# Patient Record
Sex: Male | Born: 1950 | Race: Black or African American | Hispanic: No | Marital: Married | Smoking: Former smoker
Health system: Southern US, Community
[De-identification: ages and names within clinical notes are randomized; demographics above are authoritative.]

## PROBLEM LIST (undated history)

## (undated) DIAGNOSIS — E119 Type 2 diabetes mellitus without complications: Secondary | ICD-10-CM

## (undated) DIAGNOSIS — I482 Chronic atrial fibrillation, unspecified: Secondary | ICD-10-CM

## (undated) DIAGNOSIS — J309 Allergic rhinitis, unspecified: Secondary | ICD-10-CM

## (undated) DIAGNOSIS — K921 Melena: Secondary | ICD-10-CM

## (undated) DIAGNOSIS — I739 Peripheral vascular disease, unspecified: Secondary | ICD-10-CM

## (undated) DIAGNOSIS — Z940 Kidney transplant status: Secondary | ICD-10-CM

## (undated) DIAGNOSIS — N400 Enlarged prostate without lower urinary tract symptoms: Secondary | ICD-10-CM

## (undated) DIAGNOSIS — I10 Essential (primary) hypertension: Secondary | ICD-10-CM

## (undated) DIAGNOSIS — I82409 Acute embolism and thrombosis of unspecified deep veins of unspecified lower extremity: Secondary | ICD-10-CM

## (undated) HISTORY — PX: KIDNEY TRANSPLANT: SHX239

---
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
## 2019-02-27 ENCOUNTER — Emergency Department (HOSPITAL_BASED_OUTPATIENT_CLINIC_OR_DEPARTMENT_OTHER): Payer: Medicare HMO

## 2019-02-27 ENCOUNTER — Encounter (HOSPITAL_BASED_OUTPATIENT_CLINIC_OR_DEPARTMENT_OTHER): Payer: Self-pay

## 2019-02-27 ENCOUNTER — Inpatient Hospital Stay (HOSPITAL_BASED_OUTPATIENT_CLINIC_OR_DEPARTMENT_OTHER)
Admission: EM | Admit: 2019-02-27 | Discharge: 2019-03-08 | DRG: 871 | Disposition: A | Discharge status: Rehabilitation Facility | Payer: Medicare HMO | Attending: Internal Medicine | Admitting: Internal Medicine

## 2019-02-27 ENCOUNTER — Other Ambulatory Visit: Payer: Self-pay

## 2019-02-27 DIAGNOSIS — D696 Thrombocytopenia, unspecified: Secondary | ICD-10-CM | POA: Diagnosis present

## 2019-02-27 DIAGNOSIS — N179 Acute kidney failure, unspecified: Secondary | ICD-10-CM | POA: Diagnosis present

## 2019-02-27 DIAGNOSIS — J189 Pneumonia, unspecified organism: Secondary | ICD-10-CM | POA: Diagnosis present

## 2019-02-27 DIAGNOSIS — Y830 Surgical operation with transplant of whole organ as the cause of abnormal reaction of the patient, or of later complication, without mention of misadventure at the time of the procedure: Secondary | ICD-10-CM | POA: Diagnosis present

## 2019-02-27 DIAGNOSIS — E1151 Type 2 diabetes mellitus with diabetic peripheral angiopathy without gangrene: Secondary | ICD-10-CM | POA: Diagnosis present

## 2019-02-27 DIAGNOSIS — I482 Chronic atrial fibrillation, unspecified: Secondary | ICD-10-CM | POA: Diagnosis present

## 2019-02-27 DIAGNOSIS — J9690 Respiratory failure, unspecified, unspecified whether with hypoxia or hypercapnia: Secondary | ICD-10-CM

## 2019-02-27 DIAGNOSIS — Z20828 Contact with and (suspected) exposure to other viral communicable diseases: Secondary | ICD-10-CM | POA: Diagnosis present

## 2019-02-27 DIAGNOSIS — M1030 Gout due to renal impairment, unspecified site: Secondary | ICD-10-CM | POA: Diagnosis present

## 2019-02-27 DIAGNOSIS — D631 Anemia in chronic kidney disease: Secondary | ICD-10-CM | POA: Diagnosis present

## 2019-02-27 DIAGNOSIS — A4159 Other Gram-negative sepsis: Secondary | ICD-10-CM | POA: Diagnosis not present

## 2019-02-27 DIAGNOSIS — Z7983 Long term (current) use of bisphosphonates: Secondary | ICD-10-CM

## 2019-02-27 DIAGNOSIS — Z538 Procedure and treatment not carried out for other reasons: Secondary | ICD-10-CM | POA: Diagnosis not present

## 2019-02-27 DIAGNOSIS — Z940 Kidney transplant status: Secondary | ICD-10-CM | POA: Diagnosis not present

## 2019-02-27 DIAGNOSIS — R05 Cough: Secondary | ICD-10-CM | POA: Diagnosis not present

## 2019-02-27 DIAGNOSIS — N2889 Other specified disorders of kidney and ureter: Secondary | ICD-10-CM | POA: Diagnosis present

## 2019-02-27 DIAGNOSIS — R6520 Severe sepsis without septic shock: Secondary | ICD-10-CM | POA: Diagnosis not present

## 2019-02-27 DIAGNOSIS — J181 Lobar pneumonia, unspecified organism: Secondary | ICD-10-CM | POA: Diagnosis not present

## 2019-02-27 DIAGNOSIS — N170 Acute kidney failure with tubular necrosis: Secondary | ICD-10-CM | POA: Diagnosis not present

## 2019-02-27 DIAGNOSIS — R5381 Other malaise: Secondary | ICD-10-CM | POA: Diagnosis not present

## 2019-02-27 DIAGNOSIS — E1122 Type 2 diabetes mellitus with diabetic chronic kidney disease: Secondary | ICD-10-CM | POA: Diagnosis present

## 2019-02-27 DIAGNOSIS — R6521 Severe sepsis with septic shock: Secondary | ICD-10-CM | POA: Diagnosis present

## 2019-02-27 DIAGNOSIS — Z20822 Contact with and (suspected) exposure to covid-19: Secondary | ICD-10-CM

## 2019-02-27 DIAGNOSIS — E875 Hyperkalemia: Secondary | ICD-10-CM | POA: Diagnosis present

## 2019-02-27 DIAGNOSIS — E210 Primary hyperparathyroidism: Secondary | ICD-10-CM | POA: Diagnosis present

## 2019-02-27 DIAGNOSIS — E876 Hypokalemia: Secondary | ICD-10-CM | POA: Diagnosis present

## 2019-02-27 DIAGNOSIS — E872 Acidosis: Secondary | ICD-10-CM | POA: Diagnosis present

## 2019-02-27 DIAGNOSIS — Z79890 Hormone replacement therapy (postmenopausal): Secondary | ICD-10-CM

## 2019-02-27 DIAGNOSIS — E1169 Type 2 diabetes mellitus with other specified complication: Secondary | ICD-10-CM | POA: Diagnosis not present

## 2019-02-27 DIAGNOSIS — A419 Sepsis, unspecified organism: Secondary | ICD-10-CM | POA: Diagnosis present

## 2019-02-27 DIAGNOSIS — Z8249 Family history of ischemic heart disease and other diseases of the circulatory system: Secondary | ICD-10-CM

## 2019-02-27 DIAGNOSIS — H1132 Conjunctival hemorrhage, left eye: Secondary | ICD-10-CM | POA: Diagnosis not present

## 2019-02-27 DIAGNOSIS — E785 Hyperlipidemia, unspecified: Secondary | ICD-10-CM | POA: Diagnosis present

## 2019-02-27 DIAGNOSIS — I248 Other forms of acute ischemic heart disease: Secondary | ICD-10-CM | POA: Diagnosis present

## 2019-02-27 DIAGNOSIS — J9601 Acute respiratory failure with hypoxia: Secondary | ICD-10-CM | POA: Diagnosis present

## 2019-02-27 DIAGNOSIS — Z86718 Personal history of other venous thrombosis and embolism: Secondary | ICD-10-CM

## 2019-02-27 DIAGNOSIS — R197 Diarrhea, unspecified: Secondary | ICD-10-CM | POA: Diagnosis present

## 2019-02-27 DIAGNOSIS — I361 Nonrheumatic tricuspid (valve) insufficiency: Secondary | ICD-10-CM | POA: Diagnosis not present

## 2019-02-27 DIAGNOSIS — N183 Chronic kidney disease, stage 3 (moderate): Secondary | ICD-10-CM | POA: Diagnosis present

## 2019-02-27 DIAGNOSIS — R52 Pain, unspecified: Secondary | ICD-10-CM

## 2019-02-27 DIAGNOSIS — G9349 Other encephalopathy: Secondary | ICD-10-CM | POA: Diagnosis present

## 2019-02-27 DIAGNOSIS — J309 Allergic rhinitis, unspecified: Secondary | ICD-10-CM | POA: Diagnosis present

## 2019-02-27 DIAGNOSIS — R0902 Hypoxemia: Secondary | ICD-10-CM

## 2019-02-27 DIAGNOSIS — Z01818 Encounter for other preprocedural examination: Secondary | ICD-10-CM

## 2019-02-27 DIAGNOSIS — R600 Localized edema: Secondary | ICD-10-CM | POA: Diagnosis not present

## 2019-02-27 DIAGNOSIS — E669 Obesity, unspecified: Secondary | ICD-10-CM | POA: Diagnosis not present

## 2019-02-27 DIAGNOSIS — M1000 Idiopathic gout, unspecified site: Secondary | ICD-10-CM | POA: Diagnosis not present

## 2019-02-27 DIAGNOSIS — R791 Abnormal coagulation profile: Secondary | ICD-10-CM | POA: Diagnosis present

## 2019-02-27 DIAGNOSIS — Z7952 Long term (current) use of systemic steroids: Secondary | ICD-10-CM

## 2019-02-27 DIAGNOSIS — Z9911 Dependence on respirator [ventilator] status: Secondary | ICD-10-CM

## 2019-02-27 DIAGNOSIS — Z79899 Other long term (current) drug therapy: Secondary | ICD-10-CM

## 2019-02-27 DIAGNOSIS — Z841 Family history of disorders of kidney and ureter: Secondary | ICD-10-CM

## 2019-02-27 DIAGNOSIS — Z833 Family history of diabetes mellitus: Secondary | ICD-10-CM

## 2019-02-27 DIAGNOSIS — I129 Hypertensive chronic kidney disease with stage 1 through stage 4 chronic kidney disease, or unspecified chronic kidney disease: Secondary | ICD-10-CM | POA: Diagnosis present

## 2019-02-27 DIAGNOSIS — Z7901 Long term (current) use of anticoagulants: Secondary | ICD-10-CM

## 2019-02-27 DIAGNOSIS — T8619 Other complication of kidney transplant: Secondary | ICD-10-CM | POA: Diagnosis present

## 2019-02-27 DIAGNOSIS — I10 Essential (primary) hypertension: Secondary | ICD-10-CM | POA: Diagnosis not present

## 2019-02-27 DIAGNOSIS — E1165 Type 2 diabetes mellitus with hyperglycemia: Secondary | ICD-10-CM | POA: Diagnosis not present

## 2019-02-27 DIAGNOSIS — R972 Elevated prostate specific antigen [PSA]: Secondary | ICD-10-CM | POA: Diagnosis present

## 2019-02-27 DIAGNOSIS — M10062 Idiopathic gout, left knee: Secondary | ICD-10-CM | POA: Diagnosis not present

## 2019-02-27 DIAGNOSIS — Z87891 Personal history of nicotine dependence: Secondary | ICD-10-CM

## 2019-02-27 DIAGNOSIS — E8770 Fluid overload, unspecified: Secondary | ICD-10-CM | POA: Diagnosis not present

## 2019-02-27 HISTORY — DX: Acute embolism and thrombosis of unspecified deep veins of unspecified lower extremity: I82.409

## 2019-02-27 HISTORY — DX: Benign prostatic hyperplasia without lower urinary tract symptoms: N40.0

## 2019-02-27 HISTORY — DX: Allergic rhinitis, unspecified: J30.9

## 2019-02-27 HISTORY — DX: Type 2 diabetes mellitus without complications: E11.9

## 2019-02-27 HISTORY — DX: Chronic atrial fibrillation, unspecified: I48.20

## 2019-02-27 HISTORY — DX: Melena: K92.1

## 2019-02-27 HISTORY — DX: Essential (primary) hypertension: I10

## 2019-02-27 HISTORY — DX: Kidney transplant status: Z94.0

## 2019-02-27 HISTORY — DX: Peripheral vascular disease, unspecified: I73.9

## 2019-02-27 LAB — COMPREHENSIVE METABOLIC PANEL
ALT: 15 U/L (ref 0–44)
AST: 20 U/L (ref 15–41)
Albumin: 3.3 g/dL — ABNORMAL LOW (ref 3.5–5.0)
Alkaline Phosphatase: 41 U/L (ref 38–126)
Anion gap: 11 (ref 5–15)
BUN: 32 mg/dL — ABNORMAL HIGH (ref 8–23)
CO2: 18 mmol/L — ABNORMAL LOW (ref 22–32)
Calcium: 9.4 mg/dL (ref 8.9–10.3)
Chloride: 109 mmol/L (ref 98–111)
Creatinine, Ser: 2.01 mg/dL — ABNORMAL HIGH (ref 0.61–1.24)
GFR calc Af Amer: 38 mL/min — ABNORMAL LOW (ref >60)
GFR calc non Af Amer: 33 mL/min — ABNORMAL LOW (ref >60)
Glucose, Bld: 147 mg/dL — ABNORMAL HIGH (ref 70–99)
Potassium: 4.2 mmol/L (ref 3.5–5.1)
Sodium: 138 mmol/L (ref 135–145)
Total Bilirubin: 2 mg/dL — ABNORMAL HIGH (ref 0.3–1.2)
Total Protein: 6 g/dL — ABNORMAL LOW (ref 6.5–8.1)

## 2019-02-27 LAB — POCT I-STAT 7, (LYTES, BLD GAS, ICA,H+H)
Acid-base deficit: 10 mmol/L — ABNORMAL HIGH (ref 0.0–2.0)
Bicarbonate: 12 mmol/L — ABNORMAL LOW (ref 20.0–28.0)
Calcium, Ion: 1.32 mmol/L (ref 1.15–1.40)
HCT: 39 % (ref 39.0–52.0)
Hemoglobin: 13.3 g/dL (ref 13.0–17.0)
O2 Saturation: 94 %
Patient temperature: 102.4
Potassium: 3.5 mmol/L (ref 3.5–5.1)
Sodium: 136 mmol/L (ref 135–145)
TCO2: 12 mmol/L — ABNORMAL LOW (ref 22–32)
pCO2 arterial: 19.4 mmHg — CL (ref 32.0–48.0)
pH, Arterial: 7.406 (ref 7.350–7.450)
pO2, Arterial: 74 mmHg — ABNORMAL LOW (ref 83.0–108.0)

## 2019-02-27 LAB — CBC
HCT: 45.9 % (ref 39.0–52.0)
Hemoglobin: 13.9 g/dL (ref 13.0–17.0)
MCH: 26.9 pg (ref 26.0–34.0)
MCHC: 30.3 g/dL (ref 30.0–36.0)
MCV: 88.8 fL (ref 80.0–100.0)
Platelets: 119 10*3/uL — ABNORMAL LOW (ref 150–400)
RBC: 5.17 MIL/uL (ref 4.22–5.81)
RDW: 15.9 % — ABNORMAL HIGH (ref 11.5–15.5)
WBC: 3.8 10*3/uL — ABNORMAL LOW (ref 4.0–10.5)
nRBC: 0 % (ref 0.0–0.2)

## 2019-02-27 LAB — LIPASE, BLOOD: Lipase: 32 U/L (ref 11–51)

## 2019-02-27 LAB — URINALYSIS, ROUTINE W REFLEX MICROSCOPIC
Bilirubin Urine: NEGATIVE
Glucose, UA: NEGATIVE mg/dL
Hgb urine dipstick: NEGATIVE
Ketones, ur: 15 mg/dL — AB
Leukocytes,Ua: NEGATIVE
Nitrite: NEGATIVE
Protein, ur: NEGATIVE mg/dL
Specific Gravity, Urine: 1.025 (ref 1.005–1.030)
pH: 5.5 (ref 5.0–8.0)

## 2019-02-27 LAB — TROPONIN I
Troponin I: 0.19 ng/mL (ref <0.03)
Troponin I: 0.25 ng/mL (ref <0.03)

## 2019-02-27 LAB — SARS CORONAVIRUS 2 AG (30 MIN TAT): SARS Coronavirus 2 Ag: NEGATIVE

## 2019-02-27 LAB — BRAIN NATRIURETIC PEPTIDE: B Natriuretic Peptide: 597.6 pg/mL — ABNORMAL HIGH (ref 0.0–100.0)

## 2019-02-27 LAB — LACTIC ACID, PLASMA
Lactic Acid, Venous: 3.9 mmol/L (ref 0.5–1.9)
Lactic Acid, Venous: 4 mmol/L (ref 0.5–1.9)

## 2019-02-27 MED ORDER — SODIUM CHLORIDE 0.9 % IV BOLUS (SEPSIS)
1000.0000 mL | Freq: Once | INTRAVENOUS | Status: AC
  Administered 2019-02-27: 1000 mL via INTRAVENOUS

## 2019-02-27 MED ORDER — VANCOMYCIN HCL IN DEXTROSE 1-5 GM/200ML-% IV SOLN
1000.0000 mg | INTRAVENOUS | Status: DC
  Filled 2019-02-27: qty 200

## 2019-02-27 MED ORDER — VANCOMYCIN HCL 500 MG IV SOLR
INTRAVENOUS | Status: AC
  Filled 2019-02-27: qty 500

## 2019-02-27 MED ORDER — VANCOMYCIN HCL 10 G IV SOLR
1500.0000 mg | Freq: Once | INTRAVENOUS | Status: AC
  Administered 2019-02-27: 1500 mg via INTRAVENOUS
  Filled 2019-02-27: qty 1500

## 2019-02-27 MED ORDER — SODIUM CHLORIDE 0.9 % IV BOLUS (SEPSIS)
1000.0000 mL | Freq: Once | INTRAVENOUS | Status: AC
  Administered 2019-02-27: 20:00:00 1000 mL via INTRAVENOUS

## 2019-02-27 MED ORDER — METRONIDAZOLE IN NACL 5-0.79 MG/ML-% IV SOLN
500.0000 mg | Freq: Once | INTRAVENOUS | Status: AC
  Administered 2019-02-27: 17:00:00 500 mg via INTRAVENOUS
  Filled 2019-02-27: qty 100

## 2019-02-27 MED ORDER — SODIUM CHLORIDE 0.9 % IV SOLN: INTRAVENOUS | Status: DC

## 2019-02-27 MED ORDER — VANCOMYCIN HCL 1000 MG IV SOLR
INTRAVENOUS | Status: AC
  Filled 2019-02-27: qty 1000

## 2019-02-27 MED ORDER — SODIUM CHLORIDE 0.9 % IV SOLN
2.0000 g | Freq: Once | INTRAVENOUS | Status: AC
  Administered 2019-02-27: 2 g via INTRAVENOUS
  Filled 2019-02-27: qty 2

## 2019-02-27 MED ORDER — ACETAMINOPHEN 500 MG PO TABS
1000.0000 mg | ORAL_TABLET | Freq: Once | ORAL | Status: AC
  Administered 2019-02-27: 16:00:00 1000 mg via ORAL

## 2019-02-27 MED ORDER — SODIUM CHLORIDE 0.9 % IV SOLN
2.0000 g | Freq: Two times a day (BID) | INTRAVENOUS | Status: DC
  Administered 2019-02-28 – 2019-03-01 (×3): 2 g via INTRAVENOUS
  Filled 2019-02-27 (×5): qty 2

## 2019-02-27 MED ORDER — CEFEPIME HCL 2 G IJ SOLR
INTRAMUSCULAR | Status: AC
  Filled 2019-02-27: qty 2

## 2019-02-27 MED ORDER — SODIUM CHLORIDE 0.9 % IV BOLUS (SEPSIS)
1000.0000 mL | Freq: Once | INTRAVENOUS | Status: AC
  Administered 2019-02-27: 18:00:00 1000 mL via INTRAVENOUS

## 2019-02-27 MED ORDER — VANCOMYCIN HCL IN DEXTROSE 1-5 GM/200ML-% IV SOLN
1000.0000 mg | Freq: Once | INTRAVENOUS | Status: DC
  Filled 2019-02-27: qty 200

## 2019-02-27 MED ORDER — SODIUM CHLORIDE 0.9 % IV BOLUS
500.0000 mL | Freq: Once | INTRAVENOUS | Status: AC
  Administered 2019-02-27: 500 mL via INTRAVENOUS

## 2019-02-27 MED ORDER — ACETAMINOPHEN 500 MG PO TABS
ORAL_TABLET | ORAL | Status: AC
  Filled 2019-02-27: qty 2

## 2019-02-27 MED ORDER — IOHEXOL 300 MG/ML  SOLN
100.0000 mL | Freq: Once | INTRAMUSCULAR | Status: AC | PRN
  Administered 2019-02-27: 19:00:00 100 mL via INTRAVENOUS

## 2019-02-27 NOTE — H&P (Addendum)
NAME:  Gurtej Noyola, MRN:  [id number], DOB:  Mar 12, 1951, LOS: 0 ADMISSION DATE:  02/27/2019, CONSULTATION DATE:  02/27/19 REFERRING MD:  [facility]  CHIEF COMPLAINT:  Diarrhea with dyspnea  Brief History   Darl Kuss is a 68 y.o. male with hx renal transplant, presented to [facility] with diarrhea.  Found to be hypoxic due to multifocal PNA.  Transferred to [facility] ICU due to increasing O2 needs. Had transient hypotension. SJZ: Transfer from [facility] regional for diarrhea and dyspnea  History of present illness   Sukhraj Esquivias is a 68 y.o. male who has a PMH including but not limited to ESRD s/p renal transplant, DM, HTN, HLD, DVT on coumadin.  He presented to [facility] 6/3 with abdominal pain.  Had diarrhea that started the night prior after he had cut his grass.  Did report mild SOB and mild non productive cough.  No fevers/chills/sweats.  In ED, he was found to be hypoxic with slightly increased WOB.  He was placed on NRB to maintain SpO2 in 90s.  CXR demonstrated LLL opacity.  CT A/P demonstrated extensive PNA bilaterally L > R.  Initial rapid COVID negative.  Due to increased O2 needs and concern for deterioration, he was transferred to [facility] for further evaluation and management.  SJZ: Post Deceased Donor transplant 2013-01-03 at [facility]. Taking his immunosuppressants. No leafy vegetable intake or change in diet. No contacts with diarrhea, no blood in stool.  Dry, non-productive cough. No hemoptysis  Past Medical History  ESRD s/p renal transplant, DM, HTN, HLD, DVT on coumadin. [facility]   6/3 > admit.  Consults:  Nephrology.  Procedures:  None.  Significant Diagnostic Tests:  CXR 6/3 > LLL opacity. CT A / P 6/3 > extensive PNA bilaterally L > R.  Small prostatic neoplasm is not excluded.  Possible neoplasm in right kidney, non emergent MRI recommended.  Colonic diverticulosis.  Cholelithiasis.  Small ventral hernia. US renal transplant 6/4 >  Micro Data:  Blood  6/3 >  Sputum 6/3 >  U. Strep 6/4 >  U. Legionella 6/4 >  SARS CoV2 6/3 > neg. Full PCR resent GI stool panel 6/3 >   Antimicrobials:  Vanc 6/3 >  Cefepime 6/3 >  Flagyl (chronic given hx immunosuppression) 6/3 >    Interim history/subjective:  Comfortable on NRB.  Vitals stable. Dyspnea improved.  No fever, chills or sweats. Had fever at outside hospital.    Objective:  Blood pressure (!) 113/59, pulse (!) 114, temperature (!) 102.4 F (39.1 C), temperature source Rectal, resp. rate (!) 31, height 5\' 11"  (1.803 m), weight 83.9 kg, SpO2 92 %.        Intake/Output Summary (Last 24 hours) at 02/27/2019 2208-01-04 Last data filed at 02/27/2019 01-03-2105 Gross per 24 hour  Intake 4604.42 ml  Output 450 ml  Net 4154.42 ml   Filed Weights   02/27/19 1531  Weight: 83.9 kg    Examination: General: Adult male, resting in bed, in NAD. Neuro: A&O x 3, no deficits. HEENT: [place]/AT. Sclerae anicteric. EOMI. Cardiovascular: RRR, no M/R/G.  Lungs: Respirations even and unlabored.  CTA bilaterally, No W/R/R. Abdomen: BS x 4, soft, NT/ND.  Musculoskeletal: No gross deformities, no edema.  Skin: Intact, warm, no rashes.   Assessment & Plan:   Multifocal PNA with acute hypoxic respiratory failure. - Empiric abx with vanc / cefepime given hx immunosuppression. - Follow cultures. - Continue supplemental O2 as needed to maintain SpO2 > 92%. - Trend  lactate. - Bronchial hygiene. - Follow CXR.  Troponin bump - presumed demand. - Trend troponins.  Hx ESRD s/p renal transplant - followed [facility]. - Continue preadmission tacrolimus, mycophenolate, empiric bactrim. - Check tacrolimus and mycophenolate levels. - Stress steroids in lieu of preadmission prednisone. - F/u at [facility] (last seen 02/25/19, next appointment 1 year unless needed sooner).  Hx DVT - on coumadin. - Heparin in lieu of preadmission coumadin.  Hx HTN, HLD. - Continue preadmission atorvastatin. - Hold preadmission labetalol,  nifedipine.  Hx DM. - SSI. - Hold preadmission metformin, pioglitazone, sitagliptin, nateglinide.  Diarrhea. - Check GI stool panel.  Possible right native renal neoplasm noted on CT. - Outpatient MRI recommended.  Small prostatic abnormality noted on CT - neoplasm not excluded. - Assess PSA. - Day team to please consult urology.  Best Practice:  Diet: NPO. Pain/Anxiety/Delirium protocol (if indicated): N/A. VAP protocol (if indicated): N/A. DVT prophylaxis: SCD's / Heparin gtt. GI prophylaxis: N/A. Glucose control: SSI. Mobility: Bedrest. Code Status: Full. Family Communication: None available. Disposition: ICU.  Labs   CBC: Recent Labs  Lab 02/27/19 1534 02/27/19 1632  WBC 3.8*  --   HGB 13.9 13.3  HCT 45.9 39.0  MCV 88.8  --   PLT 119*  --    Basic Metabolic Panel: Recent Labs  Lab 02/27/19 1534 02/27/19 1632  NA 138 136  K 4.2 3.5  CL 109  --   CO2 18*  --   GLUCOSE 147*  --   BUN 32*  --   CREATININE 2.01*  --   CALCIUM 9.4  --    GFR: Estimated Creatinine Clearance: 37.5 mL/min (A) (by C-G formula based on SCr of 2.01 mg/dL (H)). Recent Labs  Lab 02/27/19 1534 02/27/19 1559 02/27/19 1830  WBC 3.8*  --   --   LATICACIDVEN  --  3.9* 4.0*   Liver Function Tests: Recent Labs  Lab 02/27/19 1534  AST 20  ALT 15  ALKPHOS 41  BILITOT 2.0*  PROT 6.0*  ALBUMIN 3.3*   Recent Labs  Lab 02/27/19 1534  LIPASE 32   No results for input(s): AMMONIA in the last 168 hours. ABG    Component Value Date/Time   PHART 7.406 02/27/2019 1632   PCO2ART 19.4 (LL) 02/27/2019 1632   PO2ART 74.0 (L) 02/27/2019 1632   HCO3 12.0 (L) 02/27/2019 1632   TCO2 12 (L) 02/27/2019 1632   ACIDBASEDEF 10.0 (H) 02/27/2019 1632   O2SAT 94.0 02/27/2019 1632    Coagulation Profile: No results for input(s): INR, PROTIME in the last 168 hours. Cardiac Enzymes: Recent Labs  Lab 02/27/19 1534 02/27/19 1841  TROPONINI 0.19* 0.25*   HbA1C: No results found for:  HGBA1C CBG: No results for input(s): GLUCAP in the last 168 hours.  Review of Systems:   All negative; except for those that are bolded, which indicate positives.  Constitutional: weight loss, weight gain, night sweats, fevers, chills, fatigue, weakness.  HEENT: headaches, sore throat, sneezing, nasal congestion, post nasal drip, difficulty swallowing, tooth/dental problems, visual complaints, visual changes, ear aches. Neuro: difficulty with speech, weakness, numbness, ataxia. CV:  chest pain, orthopnea, PND, swelling in lower extremities, dizziness, palpitations, syncope.  Resp: cough, hemoptysis, dyspnea, wheezing. GI: heartburn, indigestion, abdominal pain, nausea, vomiting, diarrhea, constipation, change in bowel habits, loss of appetite, hematemesis, melena, hematochezia.  GU: dysuria, change in color of urine, urgency or frequency, flank pain, hematuria. MSK: joint pain or swelling, decreased range of motion. Psych: change in mood or  affect, depression, anxiety, suicidal ideations, homicidal ideations. Skin: rash, itching, bruising.   Past medical history  He,  has a past medical history of Kidney transplant recipient.  History of cataract extraction right eye Hassell Done) [place] 2011  . Ocular hypertension of right eye  . Glaucoma suspect  . Cataract extraction status left eye +topical +vb +epi +20.0 MA60 (11/21/2012)  . Posterior vitreous detachment of left eye  . Deceased-donor kidney transplant recipient  . Hypertension associated with transplantation  . Hypertension, renal disease  . Anemia of chronic kidney failure  . CKD (chronic kidney disease) stage 5, GFR less than 15 ml/min (HCC)  . Bilateral leg edema  . Tremor due to multiple drugs  . Diabetes mellitus with end stage renal disease ([place])  . Diabetes mellitus type 2 in nonobese (HCC)  . Diabetes mellitus type 2 with hyperglycemia  . Gout due to renal impairment  . BPH with elevated PSA  . Immunosuppression ([place])  .  S/P kidney transplant  . Hypertension associated with diabetes ([place])  . Metabolic acidosis  . Hypomagnesemia  . Immunosuppressive management encounter following kidney transplant  . Chronic rejection of kidney transplant  . Elevated serum creatinine  . CKD (chronic kidney disease) stage 3, GFR 30-59 ml/min (HCC)  . Recurrent versus de novo disease in kidney transplant  . Epiretinal membrane, right eye  . Peripheral retinal scars right eye  . History of retinal detachment, right eye  . Retinal detachment of left eye with retinal break s/p Pneumatic + Cryo 1.21.15 vj  . Diarrhea  . Diabetes mellitus type 2 with stage 3 chronic kidney disease  . Kidney transplant status, cadaveric  . Immunosuppression therapy  . Hypertension, primary  . Hypercholesterolemia  . Acute venous embolism and thrombosis of deep vessels of proximal lower extremity ([place]) [I82.4Y9]  . Long term (current) use of anticoagulants [Z79.01]  . 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 Acute deep vein thrombosis (DVT) of distal vein of lower extremity (HCC)  . Acute deep vein thrombosis (DVT) of left lower extremity ([place])  . Acute maxillary sinusitis  . Allergic rhinitis  . Cavovarus deformity, congental  . Chronic atrial fibrillation ([place])  . Diabetes mellitus ([place])  . Essential hypertension  . Fracture of fifth metatarsal bone of right foot with delayed healing  . H/O kidney transplant  . Hematochezia  . History of kidney transplant  . Hospital discharge follow-up  . Hyperparathyroidism, primary ([place])  . Hypertension  . Immunosuppressed due to chemotherapy  . Left lower lobe pneumonia  . Leg pain, bilateral  . Paroxysmal atrial fibrillation ([place])  . PVD (peripheral vascular disease) ([place])  . Renal failure  . Right foot pain  . Type II diabetes mellitus ([place])  . Encounter for monitoring tacrolimus therapy  . Encounter for therapeutic drug monitoring  . Abnormal INR   Past Medical History:   Diagnosis Date  . AR (allergic rhinitis)  . Benign prostate hyperplasia 07/25/2012  . BPH (benign prostatic hyperplasia)   Family History  Problem Relation Age of Onset  . Heart disease Father  . Diabetes Father  . Kidney disease Mother  . Stroke Mother  . Hypertension Mother  . Cancer Paternal Uncle  . Cancer Maternal Grandfather  . Diabetes Sister  . Diabetes Brother  . Heart disease Brother  . Cataracts Brother  . Diabetes Brother  . Hypertension Brother  . Diabetes Brother  . Hypertension Brother  . Glaucoma Neg Hx  . Macular degeneration Neg Hx  .  Retinal detachment Neg Hx  . Strabismus Neg Hx  . Blindness Neg Hx  . Amblyopia Neg Hx     Surgical History    AV FISTULA PLACEMENT left upper arm (~2005 [place])  . CATARACT EXTRACTION 11/21/2012  left eye Dr Yisroel Ramming  . CATARACT EXTRACTION W/ INTRAOCULAR LENS & ANTERIOR VITRECTOMY right eye (11/26/2009 Dr. Sanda Klein)  . CATARACT EXTRACTION W/ INTRAOCULAR LENS IMPLANT 11/21/2012  Procedure: PHACOEMULSIFICATION PC / IOL TOPICAL; Surgeon: Sonia Side Burden, MD; Location: [facility]; Service: Ophthalmology; Laterality: Left;  . CYSTOSCOPY 10/13/2011  Procedure: CYSTOSCOPY; Surgeon: Bethann Goo, MD; Location: [facility] MAIN OR; Service: Urology; Laterality: N/A;  . CYSTOSCOPY W/ RETROGRADES 11/11/2010  bilateral retrogrades, biopsy  . KIDNEY TRANSPLANT N/A 12/01/2012  Procedure: KIDNEY TRANSPLANT CADAVERIC; Surgeon: Delman Kitten, MD; Location: [place]; Service: Transplant; Laterality: N/A; unos# [id number] bench at 10a and patient at 10:30a  . PARS PLANA VITRECTOMY W/ ENDOPHOTOCOAGULATION with gas bubble, right (07/16/2009 Dr. Crista Elliot)  . RETROGRADE PYELOGRAM 10/13/2011  Procedure: RETROGRADE PYELOGRAM; Surgeon: Bethann Goo, MD; Location: [place]; Service: Urology; Laterality: N/A;  . SKIN LESION EXCISION 10/13/2011  Procedure: ULTRASOUND GUIDED PROSTATE BIOPSY (TRANSRECTAL); Surgeon: Bethann Goo, MD; Location[facility] MAIN OR; Service: Urology; Laterality: N/A;    Social History    Tobacco Use  . Smoking status: Former Smoker  Packs/day: 0.50  Years: 15.00  Pack years: 7.50  Types: Cigarettes  Last attempt to quit: 07/27/1985  Years since quitting: 33.6  . Smokeless tobacco: Never Used  Substance and Sexual Activity  . Alcohol use: Yes  Comment: rarely  . Drug use: No  . Social History Narrative  Lives with wife  Smoking: Former smoker; quit in 1986.  Family history      Heart disease Father  . Diabetes Father  . Kidney disease Mother  . Stroke Mother  . Hypertension Mother  . Cancer Paternal Uncle  . Cancer Maternal Grandfather  . Diabetes Sister  . Diabetes Brother  . Heart disease Brother  . Cataracts Brother  . Diabetes Brother  . Hypertension Brother  . Diabetes Brother  . Hypertension Brother   Allergies Not on File   Home meds  Prior to Admission medications   Medication Sig Start Date End Date Taking? Authorizing Provider  atorvastatin (LIPITOR) 10 MG tablet Take 10 mg by mouth daily.   Yes [provider]  cinacalcet (SENSIPAR) 60 MG tablet Take 60 mg by mouth daily.   Yes [provider]  diclofenac sodium (VOLTAREN) 1 % GEL Apply topically 4 (four) times daily.   Yes [provider]  famotidine (PEPCID) 20 MG tablet Take 20 mg by mouth 2 (two) times daily.   Yes [provider]  finasteride (PROSCAR) 5 MG tablet Take 5 mg by mouth daily.   Yes [provider]  labetalol (NORMODYNE) 100 MG tablet Take 100 mg by mouth 2 (two) times daily.   Yes [provider]  magnesium oxide (MAG-OX) 400 MG tablet Take 400 mg by mouth daily.   Yes [provider]  metFORMIN (GLUCOPHAGE) 500 MG tablet Take by mouth 2 (two) times daily with a meal.   Yes [provider]  mycophenolate (MYFORTIC) 180 MG EC tablet Take 180 mg by mouth 2 (two) times daily.   Yes [provider]   nateglinide (STARLIX) 120 MG tablet Take 120 mg by mouth 3 (three) times daily with meals.   Yes [provider]  NIFEdipine (PROCARDIA XL/NIFEDICAL XL)  60 MG 24 hr tablet Take 60 mg by mouth daily.   Yes [provider]  pioglitazone (ACTOS) 30 MG tablet Take 30 mg by mouth daily.   Yes [provider]  predniSONE (DELTASONE) 10 MG tablet Take 10 mg by mouth daily with breakfast.   Yes [provider]  sitaGLIPtin (JANUVIA) 100 MG tablet Take 100 mg by mouth daily.   Yes [provider]  sulfamethoxazole-trimethoprim (BACTRIM) 400-80 MG tablet Take 1 tablet by mouth 2 (two) times daily.   Yes [provider]  tacrolimus (PROGRAF) 1 MG capsule Take 1 mg by mouth 2 (two) times daily.   Yes [provider]  tamsulosin (FLOMAX) 0.4 MG CAPS capsule Take 0.4 mg by mouth.   Yes [provider]  warfarin (COUMADIN) 5 MG tablet Take 5 mg by mouth daily.   Yes [provider]    Montey Hora, [place] Pager: [phone].  If no answer, [phone] 02/28/2019, 12:44 AM   I personally saw and evaluated the patient in the medical ICU.  His chest x-ray shows consolidation with minimal dyspnea and cough.  Given his diarrhea and presentation another concern would be for Legionella.  Have sent urine studies and have empirically placed him on antibiotics to cover this as well. His blood pressure has improved but he is at high risk for decompensation.  His skin is warm non-mottled and he is not in a shocklike state.  Part of the lactic acid elevation may be related to metformin. Will recheck COVID status, follow-up on labs and follow respiratory status.  We will try to obtain sputum studies after COVID status is known. Patient is mentating well and there are no neurological deficits He endorses that he has been taking his medications.  This patient  is critically ill with complex  pneumonia with immune-suppression and requires high complexity decision making for assessment and support, frequent evaluation and titration of therapies, application of advanced monitoring technologies and extensive interpretation of multiple databases.     Critical Care Time devoted to patient care services described in this note is 60 minutes. This time reflects time of care by Dr. Irene Pap, DO FCCP as well. Includes extensive review of EHR and examination.   This critical care time does not reflect procedure time, or teaching time or supervisory time of PA/NP/Med student/Med Resident etc but could involve care discussion time        Irene Pap, DO [place], [facility] [place]

## 2019-02-27 NOTE — ED Notes (Signed)
[facility] arrived to transport pt to [facility] 

## 2019-02-27 NOTE — ED Triage Notes (Signed)
Pt was cutting the grass yesterday and had episodes of diarrhea last PM. Stomach pain began today. Pt kidney transplant pt. Reports increased weakness.

## 2019-02-27 NOTE — ED Notes (Signed)
ED Provider at bedside. 

## 2019-02-27 NOTE — ED Provider Notes (Signed)
[facility] EMERGENCY DEPARTMENT Provider Note   CSN: [id number] Arrival date & time: 02/27/19  1518    History   Chief Complaint Chief Complaint  Patient presents with  . Abdominal Pain    HPI Ryan Rice is a 68 y.o. male.     Patient presented by with family members and brought him in.  Patient was cutting grass yesterday had episodes of diarrhea last evening.  Developed some upper quadrant abdominal pain today.  Patient is a kidney transplant patient last this was done in 2014.  Patient is followed by cardiologist tomorrow transplant service at [facility] was just seen there on June 1.  Patient also reported increased weakness out in triage.  When I went in to see the patient patient clearly had a significant fever 102.  Was tachycardic and had an elevated respiratory rate and was hypoxic with oxygen saturations started her Lexapro but is in the 80s on room air.  Patient also told me that he was having shortness of breath and had developed a cough today.  Did state that prior to last evening he felt fine.  Patient has no known COVID exposures.  And he said he has been careful.  Patient denied any blood in the diarrhea.  Denied any chest pain.  But did describe a left upper quadrant abdominal pain.     Past Medical History:  Diagnosis Date  . Kidney transplant recipient     There are no active problems to display for this patient.        Home Medications    Prior to Admission medications   Medication Sig Start Date End Date Taking? Authorizing Provider  atorvastatin (LIPITOR) 10 MG tablet Take 10 mg by mouth daily.   Yes [provider]  cinacalcet (SENSIPAR) 60 MG tablet Take 60 mg by mouth daily.   Yes [provider]  diclofenac sodium (VOLTAREN) 1 % GEL Apply topically 4 (four) times daily.   Yes [provider]  famotidine (PEPCID) 20 MG tablet Take 20 mg by mouth 2 (two) times daily.   Yes [provider]  finasteride  (PROSCAR) 5 MG tablet Take 5 mg by mouth daily.   Yes [provider]  labetalol (NORMODYNE) 100 MG tablet Take 100 mg by mouth 2 (two) times daily.   Yes [provider]  magnesium oxide (MAG-OX) 400 MG tablet Take 400 mg by mouth daily.   Yes [provider]  metFORMIN (GLUCOPHAGE) 500 MG tablet Take by mouth 2 (two) times daily with a meal.   Yes [provider]  mycophenolate (MYFORTIC) 180 MG EC tablet Take 180 mg by mouth 2 (two) times daily.   Yes [provider]  nateglinide (STARLIX) 120 MG tablet Take 120 mg by mouth 3 (three) times daily with meals.   Yes [provider]  NIFEdipine (PROCARDIA XL/NIFEDICAL XL) 60 MG 24 hr tablet Take 60 mg by mouth daily.   Yes [provider]  pioglitazone (ACTOS) 30 MG tablet Take 30 mg by mouth daily.   Yes [provider]  predniSONE (DELTASONE) 10 MG tablet Take 10 mg by mouth daily with breakfast.   Yes [provider]  sitaGLIPtin (JANUVIA) 100 MG tablet Take 100 mg by mouth daily.   Yes [provider]  sulfamethoxazole-trimethoprim (BACTRIM) 400-80 MG tablet Take 1 tablet by mouth 2 (two) times daily.   Yes [provider]  tacrolimus (PROGRAF) 1 MG capsule Take 1 mg by mouth 2 (two)  times daily.   Yes [provider]  tamsulosin (FLOMAX) 0.4 MG CAPS capsule Take 0.4 mg by mouth.   Yes [provider]  warfarin (COUMADIN) 5 MG tablet Take 5 mg by mouth daily.   Yes [provider]    Family History No family history on file.  Social History Social History   Tobacco Use  . Smoking status: Not on file  Substance Use Topics  . Alcohol use: Not on file  . Drug use: Not on file     Allergies   Patient has no allergy information on record.   Review of Systems Review of Systems  Constitutional: Positive for fatigue. Negative for chills and fever.  HENT: Negative for congestion, rhinorrhea and sore throat.    Eyes: Negative for visual disturbance.  Respiratory: Positive for cough and shortness of breath.   Cardiovascular: Negative for chest pain and leg swelling.  Gastrointestinal: Positive for abdominal pain and diarrhea. Negative for blood in stool, nausea and vomiting.  Genitourinary: Negative for dysuria.  Musculoskeletal: Negative for back pain and neck pain.  Skin: Negative for rash.  Allergic/Immunologic: Positive for immunocompromised state.  Neurological: Negative for dizziness, light-headedness and headaches.  Hematological: Does not bruise/bleed easily.  Psychiatric/Behavioral: Negative for confusion.     Physical Exam Updated Vital Signs BP (!) 95/57   Pulse (!) 107   Temp (!) 102.4 F (39.1 C) (Rectal)   Resp (!) 27   Ht 1.803 m (5' 11" )   Wt 83.9 kg   SpO2 97%   BMI 25.80 kg/m   Physical Exam Vitals signs and nursing note reviewed.  Constitutional:      General: He is in acute distress.     Appearance: He is well-developed. He is toxic-appearing. He is not ill-appearing.  HENT:     Head: Normocephalic and atraumatic.  Eyes:     Extraocular Movements: Extraocular movements intact.     Conjunctiva/sclera: Conjunctivae normal.     Pupils: Pupils are equal, round, and reactive to light.  Neck:     Musculoskeletal: Normal range of motion and neck supple. No neck rigidity.  Cardiovascular:     Rate and Rhythm: Regular rhythm. Tachycardia present.     Heart sounds: No murmur.  Pulmonary:     Effort: Respiratory distress present.     Breath sounds: Rales present. No wheezing.  Chest:     Chest wall: No tenderness.  Abdominal:     General: There is no distension.     Palpations: Abdomen is soft.     Tenderness: There is no abdominal tenderness. There is no guarding.  Musculoskeletal:     Right lower leg: Edema present.     Left lower leg: Edema present.     Comments: Trace pitting edema.  Left upper extremity with old dialysis AV fistula that has not been used  for years.  Skin:    General: Skin is warm and dry.     Capillary Refill: Capillary refill takes less than 2 seconds.  Neurological:     General: No focal deficit present.     Mental Status: He is alert and oriented to person, place, and time.     Cranial Nerves: No cranial nerve deficit.     Sensory: No sensory deficit.     Motor: No weakness.     Coordination: Coordination normal.      ED Treatments / Results  Labs (all labs ordered are listed, but only abnormal results are displayed) Labs Reviewed  CBC - Abnormal; Notable for the following components:      Result Value   WBC 3.8 (*)    RDW 15.9 (*)    Platelets 119 (*)    All other components within normal limits  COMPREHENSIVE METABOLIC PANEL - Abnormal; Notable for the following components:   CO2 18 (*)    Glucose, Bld 147 (*)    BUN 32 (*)    Creatinine, Ser 2.01 (*)    Total Protein 6.0 (*)    Albumin 3.3 (*)    Total Bilirubin 2.0 (*)    GFR calc non Af Amer 33 (*)    GFR calc Af Amer 38 (*)    All other components within normal limits  TROPONIN I - Abnormal; Notable for the following components:   Troponin I 0.19 (*)    All other components within normal limits  LACTIC ACID, PLASMA - Abnormal; Notable for the following components:   Lactic Acid, Venous 3.9 (*)    All other components within normal limits  BRAIN NATRIURETIC PEPTIDE - Abnormal; Notable for the following components:   B Natriuretic Peptide 597.6 (*)    All other components within normal limits  POCT I-STAT 7, (LYTES, BLD GAS, ICA,H+H) - Abnormal; Notable for the following components:   pCO2 arterial 19.4 (*)    pO2, Arterial 74.0 (*)    Bicarbonate 12.0 (*)    TCO2 12 (*)    Acid-base deficit 10.0 (*)    All other components within normal limits  SARS CORONAVIRUS 2 (HOSP ORDER, PERFORMED IN [facility] VIA ABBOTT ID)  CULTURE, BLOOD (ROUTINE X 2)  CULTURE, BLOOD (ROUTINE X 2)  URINE CULTURE  LIPASE, BLOOD  LACTIC ACID, PLASMA   URINALYSIS, ROUTINE W REFLEX MICROSCOPIC  BASIC METABOLIC PANEL  I-STAT ARTERIAL BLOOD GAS, ED    EKG None   ED ECG REPORT   Date: 02/27/2019  Rate: 117  Rhythm: sinus tachycardia  QRS Axis: normal  Intervals: normal  ST/T Wave abnormalities: nonspecific ST/T changes  Conduction Disutrbances:none  Narrative Interpretation:   Old EKG Reviewed: none available  I have personally reviewed the EKG tracing and agree with the computerized printout as noted. Patient was some artifact throughout.  May very well be atrial fibrillation.  Or sinus arrhythmia.  Also patient has multiple ventricular premature complexes.    Radiology Dg Chest Port 1 View  Result Date: 02/27/2019 CLINICAL DATA:  Fever, shortness of breath. EXAM: PORTABLE CHEST 1 VIEW COMPARISON:  None. FINDINGS: Mild cardiomegaly is noted. No pneumothorax is noted. Right lung is clear. Atherosclerosis of thoracic aorta is noted. Large left lower lobe airspace opacity is noted most consistent with pneumonia. Bony thorax is unremarkable. IMPRESSION: Large left lower lobe airspace opacity consistent with pneumonia. Followup PA and lateral chest X-ray is recommended in 3-4 weeks following trial of antibiotic therapy to ensure resolution and exclude underlying malignancy. Aortic Atherosclerosis (ICD10-I70.0). Electronically Signed   By: Marijo Conception M.D.   On: 02/27/2019 16:58    Procedures Procedures (including critical care time)  CRITICAL CARE Performed by: Fredia Sorrow Total critical care time: 60 minutes Critical care time was exclusive of separately billable procedures and treating other patients. Critical care was necessary to treat or prevent imminent or life-threatening deterioration. Critical care was time spent personally by me on the following activities: development of treatment plan with patient and/or surrogate as well as nursing, discussions with consultants, evaluation of patient's response to treatment,  examination of patient, obtaining  history from patient or surrogate, ordering and performing treatments and interventions, ordering and review of laboratory studies, ordering and review of radiographic studies, pulse oximetry and re-evaluation of patient's condition.   Medications Ordered in ED Medications  metroNIDAZOLE (FLAGYL) IVPB 500 mg (500 mg Intravenous New Bag/Given 02/27/19 1654)  0.9 %  sodium chloride infusion (has no administration in time range)  ceFEPIme (MAXIPIME) 2 g in sodium chloride 0.9 % 100 mL IVPB (has no administration in time range)  vancomycin (VANCOCIN) 1,500 mg in sodium chloride 0.9 % 500 mL IVPB (has no administration in time range)  vancomycin (VANCOCIN) IVPB 1000 mg/200 mL premix (has no administration in time range)  acetaminophen (TYLENOL) tablet 1,000 mg (1,000 mg Oral Given 02/27/19 1601)  ceFEPIme (MAXIPIME) 2 g in sodium chloride 0.9 % 100 mL IVPB (0 g Intravenous Stopped 02/27/19 1652)  sodium chloride 0.9 % bolus 500 mL (0 mLs Intravenous Stopped 02/27/19 1652)  ceFEPIme (MAXIPIME) 2 g injection (  Return to [facility] 02/27/19 1632)     Initial Impression / Assessment and Plan / ED Course  I have reviewed the triage vital signs and the nursing notes.  Pertinent labs & imaging results that were available during my care of the patient were reviewed by me and considered in my medical decision making (see chart for details).       Patient based on vital signs met sepsis..  Fever tachycardic tachypneic.  Not hypotensive.  Patient started on sepsis protocol.  Started on broad-spectrum antibiotics.  Initially the total of 30 cc/kg of fluid was withheld.  Since he was not hypotensive.  While labs were pending.  This was a bit eventually given because his lactic acid was 3.9.   Patient's renal function from review of notes at [facility] shows that normally his creatinine is around 1.4-1.6.  Today it was elevated over 2 and BUN was elevated.  Patient also showing evidence of  metabolic acidosis.  Blood gas done showed that he had severe respiratory compensation pH was normal but PCO2 was very low.  Patient immediately started on 100% nonrebreather.  Chest x-ray showed a large left lower lobe pneumonia.  Patient in the pending CT scan of his abdomen due to the abdominal complaint.  Did have a slight elevation in his bilirubin.  That had no acute findings other than the finding of cholelithiasis.  But no evidence of any gallbladder acute cholecystitis.  Most likely the upper quadrant pain was due to the left lower lobe pneumonia.  But in addition the CT scan started to pick up some patchy infiltrates on the right side of the lungs.  Patient is a notable initial COVID testing was negative but clinically due to perhaps a multifocal pneumonia was a Rogel more concerning for perhaps COVID-19 infection.  Patient did well in the 100% nonrebreather started to feel much better.  Got his oxygen sats as high as 98%.  Respiratory rate came down.  With the fluids the tachycardia improved and heart rate came down into the 80s or 90s.  Patient was never hypotensive.  Once we had the CT scan of the abdomen contacted the transplant service at [facility] were patient preferred to be admitted.  Service there had no beds at all in the hospital.  Even though patient would probably be a medicine admission with him following.  So therefore contacted nephrology at [facility] and they were okay with him being admitted into the [facility] system.  Contacted the hospitalist at [facility]  long due to the questionable COVID infection.  In conversation with them there was some concern that although he was not requiring the patient at this time he may and they put me in contact with discussing with the critical care.  Critical care agreed with not intubating him at this time but gave precautions that if his respiratory rate got up to 35 and above sustained that he would probably need admission or if he started  to wear out.  We did prep for intubation but patient absolutely refused intubation but he started to improve.  Respiratory rate came down to 2628.  Patient said his breathing was better.  So we held off.  We will watch him carefully.  He was in good shape when [facility] came to transport him to the ICU at [facility].  Admitting physician Dr. Lamonte Sakai.  Patient did receive a total of 3 L of fluid.  Which did help him.  Patient did start to urinate some here.  But never urinated briskly.   Final Clinical Impressions(s) / ED Diagnoses   Final diagnoses:  Sepsis, due to unspecified organism, unspecified whether acute organ dysfunction present [facility])  Hypoxia  Covid-19 Virus not Detected    ED Discharge Orders    None       Fredia Sorrow, MD 02/27/19 2356

## 2019-02-27 NOTE — ED Notes (Signed)
Pt on non re breather

## 2019-02-27 NOTE — ED Notes (Addendum)
Right radial artery x 1 attempt. No complications noted. Bleeding stopped. ABG results pending. Patient on 100% NRB.

## 2019-02-27 NOTE — Progress Notes (Signed)
Pharmacy Antibiotic Note  Ryan Rice is a 68 y.o. male admitted on 02/27/2019 with sepsis.  Pharmacy has been consulted for vancomycin and cefepime dosing. Scr 2.01 (CrCl 37.86mL/min), WBC 3.8, Tmax 102.4.  Plan: Vancomycin IV 1500mg  x1 Vancomycin IV 1000mg  q24hrs Cefepime 2g q12h Monitor renal function, LOT, culture data as available  Height: 5\' 11"  (180.3 cm) Weight: 185 lb (83.9 kg) IBW/kg (Calculated) : 75.3  Temp (24hrs), Avg:102.4 F (39.1 C), Min:102.4 F (39.1 C), Max:102.4 F (39.1 C)  Recent Labs  Lab 02/27/19 1534 02/27/19 1559  WBC 3.8*  --   CREATININE 2.01*  --   LATICACIDVEN  --  3.9*    Estimated Creatinine Clearance: 37.5 mL/min (A) (by C-G formula based on SCr of 2.01 mg/dL (H)).    Not on File  Antimicrobials this admission: Vancomycin 6/3 >> Cefepime 6/3 >>  Thank you for allowing pharmacy to be a part of this patient's care.  Janae Bridgeman, PharmD PGY1 Pharmacy Resident Phone: [phone] 02/27/2019 4:51 PM

## 2019-02-27 NOTE — ED Notes (Signed)
X-ray at bedside

## 2019-02-27 NOTE — Care Plan (Signed)
68 yo M w hx of kidney transplant, DM 2  had an episode of diarrhea yesterday On 100% NR now 94% 95/57 HR 107 now in 90's Temp 102.4 WBC 3.8  Cr 2.0 Trop 0.19 ->0.25  Lactic acid 3.9- 4.0  [facility] had no beds Nephrology was consulted Dr. Augustin Coupe Covid neg CT abd non acute Showing large LEFT LL PNA and patchy on the right  7.406/19.4/74  Requested PCCM consult As pt is decompensating On high level of oxygen Initial Covid negative  Will need airborne precautions 9:41 PM   patient was accepted by PCCM to ICU at [facility] 10:07 PM Ryan Rice

## 2019-02-27 NOTE — ED Notes (Signed)
Misty, RN

## 2019-02-28 ENCOUNTER — Inpatient Hospital Stay (HOSPITAL_COMMUNITY): Payer: Medicare HMO

## 2019-02-28 ENCOUNTER — Telehealth (HOSPITAL_BASED_OUTPATIENT_CLINIC_OR_DEPARTMENT_OTHER): Payer: Self-pay | Admitting: Emergency Medicine

## 2019-02-28 DIAGNOSIS — J189 Pneumonia, unspecified organism: Secondary | ICD-10-CM

## 2019-02-28 DIAGNOSIS — Z940 Kidney transplant status: Secondary | ICD-10-CM

## 2019-02-28 DIAGNOSIS — N179 Acute kidney failure, unspecified: Secondary | ICD-10-CM

## 2019-02-28 LAB — POCT I-STAT 7, (LYTES, BLD GAS, ICA,H+H)
Acid-base deficit: 13 mmol/L — ABNORMAL HIGH (ref 0.0–2.0)
Acid-base deficit: 12 mmol/L — ABNORMAL HIGH (ref 0.0–2.0)
Bicarbonate: 14.6 mmol/L — ABNORMAL LOW (ref 20.0–28.0)
Bicarbonate: 12 mmol/L — ABNORMAL LOW (ref 20.0–28.0)
Calcium, Ion: 1.23 mmol/L (ref 1.15–1.40)
Calcium, Ion: 1.15 mmol/L (ref 1.15–1.40)
HCT: 37 % — ABNORMAL LOW (ref 39.0–52.0)
HCT: 49 % (ref 39.0–52.0)
Hemoglobin: 12.6 g/dL — ABNORMAL LOW (ref 13.0–17.0)
Hemoglobin: 16.7 g/dL (ref 13.0–17.0)
O2 Saturation: 99 %
O2 Saturation: 89 %
Potassium: 5 mmol/L (ref 3.5–5.1)
Potassium: 4.7 mmol/L (ref 3.5–5.1)
Sodium: 139 mmol/L (ref 135–145)
Sodium: 140 mmol/L (ref 135–145)
TCO2: 16 mmol/L — ABNORMAL LOW (ref 22–32)
TCO2: 13 mmol/L — ABNORMAL LOW (ref 22–32)
pCO2 arterial: 39.2 mmHg (ref 32.0–48.0)
pCO2 arterial: 24.2 mmHg — ABNORMAL LOW (ref 32.0–48.0)
pH, Arterial: 7.179 — CL (ref 7.350–7.450)
pH, Arterial: 7.303 — ABNORMAL LOW (ref 7.350–7.450)
pO2, Arterial: 152 mmHg — ABNORMAL HIGH (ref 83.0–108.0)
pO2, Arterial: 61 mmHg — ABNORMAL LOW (ref 83.0–108.0)

## 2019-02-28 LAB — BLOOD CULTURE ID PANEL (REFLEXED)

## 2019-02-28 LAB — BASIC METABOLIC PANEL
Anion gap: 10 (ref 5–15)
Anion gap: 10 (ref 5–15)
BUN: 35 mg/dL — ABNORMAL HIGH (ref 8–23)
BUN: 38 mg/dL — ABNORMAL HIGH (ref 8–23)
CO2: 13 mmol/L — ABNORMAL LOW (ref 22–32)
CO2: 14 mmol/L — ABNORMAL LOW (ref 22–32)
Calcium: 7.7 mg/dL — ABNORMAL LOW (ref 8.9–10.3)
Calcium: 7.4 mg/dL — ABNORMAL LOW (ref 8.9–10.3)
Chloride: 115 mmol/L — ABNORMAL HIGH (ref 98–111)
Chloride: 113 mmol/L — ABNORMAL HIGH (ref 98–111)
Creatinine, Ser: 1.92 mg/dL — ABNORMAL HIGH (ref 0.61–1.24)
Creatinine, Ser: 2.08 mg/dL — ABNORMAL HIGH (ref 0.61–1.24)
GFR calc Af Amer: 41 mL/min — ABNORMAL LOW (ref >60)
GFR calc Af Amer: 37 mL/min — ABNORMAL LOW (ref >60)
GFR calc non Af Amer: 35 mL/min — ABNORMAL LOW (ref >60)
GFR calc non Af Amer: 32 mL/min — ABNORMAL LOW (ref >60)
Glucose, Bld: 156 mg/dL — ABNORMAL HIGH (ref 70–99)
Glucose, Bld: 197 mg/dL — ABNORMAL HIGH (ref 70–99)
Potassium: 5 mmol/L (ref 3.5–5.1)
Potassium: 4.7 mmol/L (ref 3.5–5.1)
Sodium: 138 mmol/L (ref 135–145)
Sodium: 137 mmol/L (ref 135–145)

## 2019-02-28 LAB — CBC
HCT: 39.7 % (ref 39.0–52.0)
Hemoglobin: 12.1 g/dL — ABNORMAL LOW (ref 13.0–17.0)
MCH: 27.8 pg (ref 26.0–34.0)
MCHC: 30.5 g/dL (ref 30.0–36.0)
MCV: 91.1 fL (ref 80.0–100.0)
Platelets: 86 10*3/uL — ABNORMAL LOW (ref 150–400)
RBC: 4.36 MIL/uL (ref 4.22–5.81)
RDW: 16.1 % — ABNORMAL HIGH (ref 11.5–15.5)
WBC: 10.1 10*3/uL (ref 4.0–10.5)
nRBC: 0.2 % (ref 0.0–0.2)

## 2019-02-28 LAB — FIBRINOGEN: Fibrinogen: 292 mg/dL (ref 210–475)

## 2019-02-28 LAB — TROPONIN I
Troponin I: 0.24 ng/mL (ref <0.03)
Troponin I: 0.34 ng/mL (ref <0.03)
Troponin I: 0.37 ng/mL (ref <0.03)

## 2019-02-28 LAB — MRSA PCR SCREENING: MRSA by PCR: NEGATIVE

## 2019-02-28 LAB — LACTIC ACID, PLASMA: Lactic Acid, Venous: 2.8 mmol/L (ref 0.5–1.9)

## 2019-02-28 LAB — GLUCOSE, CAPILLARY
Glucose-Capillary: 56 mg/dL — ABNORMAL LOW (ref 70–99)
Glucose-Capillary: 143 mg/dL — ABNORMAL HIGH (ref 70–99)
Glucose-Capillary: 86 mg/dL (ref 70–99)
Glucose-Capillary: 129 mg/dL — ABNORMAL HIGH (ref 70–99)
Glucose-Capillary: 119 mg/dL — ABNORMAL HIGH (ref 70–99)
Glucose-Capillary: 159 mg/dL — ABNORMAL HIGH (ref 70–99)
Glucose-Capillary: 178 mg/dL — ABNORMAL HIGH (ref 70–99)

## 2019-02-28 LAB — APTT: aPTT: 52 seconds — ABNORMAL HIGH (ref 24–36)

## 2019-02-28 LAB — STREP PNEUMONIAE URINARY ANTIGEN: Strep Pneumo Urinary Antigen: NEGATIVE

## 2019-02-28 LAB — URINE CULTURE: Culture: NO GROWTH

## 2019-02-28 LAB — FERRITIN: Ferritin: 344 ng/mL — ABNORMAL HIGH (ref 24–336)

## 2019-02-28 LAB — SEDIMENTATION RATE: Sed Rate: 2 mm/hr (ref 0–16)

## 2019-02-28 LAB — PHOSPHORUS: Phosphorus: 3.9 mg/dL (ref 2.5–4.6)

## 2019-02-28 LAB — SALICYLATE LEVEL: Salicylate Lvl: <7 mg/dL (ref 2.8–30.0)

## 2019-02-28 LAB — LACTATE DEHYDROGENASE: LDH: 93 U/L — ABNORMAL LOW (ref 98–192)

## 2019-02-28 LAB — TRIGLYCERIDES: Triglycerides: 71 mg/dL (ref <150)

## 2019-02-28 LAB — PROTIME-INR
INR: 7.4 (ref 0.8–1.2)
INR: 5.5 (ref 0.8–1.2)
Prothrombin Time: 61.5 seconds — ABNORMAL HIGH (ref 11.4–15.2)
Prothrombin Time: 49.4 seconds — ABNORMAL HIGH (ref 11.4–15.2)

## 2019-02-28 LAB — PROCALCITONIN
Procalcitonin: 71.92 ng/mL
Procalcitonin: 147.88 ng/mL

## 2019-02-28 LAB — C-REACTIVE PROTEIN: CRP: 11.7 mg/dL — ABNORMAL HIGH (ref <1.0)

## 2019-02-28 LAB — PSA: Prostatic Specific Antigen: 64.96 ng/mL — ABNORMAL HIGH (ref 0.00–4.00)

## 2019-02-28 LAB — D-DIMER, QUANTITATIVE: D-Dimer, Quant: 1.16 ug/mL-FEU — ABNORMAL HIGH (ref 0.00–0.50)

## 2019-02-28 LAB — MAGNESIUM
Magnesium: 0.9 mg/dL — CL (ref 1.7–2.4)
Magnesium: 1.1 mg/dL — ABNORMAL LOW (ref 1.7–2.4)

## 2019-02-28 LAB — ABO/RH: ABO/RH(D): A POS

## 2019-02-28 LAB — HIV ANTIBODY (ROUTINE TESTING W REFLEX): HIV Screen 4th Generation wRfx: NONREACTIVE

## 2019-02-28 MED ORDER — FENTANYL BOLUS VIA INFUSION
25.0000 ug | INTRAVENOUS | Status: DC | PRN
  Administered 2019-03-01: 50 ug via INTRAVENOUS
  Filled 2019-02-28: qty 25

## 2019-02-28 MED ORDER — ROCURONIUM BROMIDE 10 MG/ML (PF) SYRINGE
PREFILLED_SYRINGE | INTRAVENOUS | Status: AC
  Administered 2019-02-28: 100 mg
  Filled 2019-02-28: qty 10

## 2019-02-28 MED ORDER — FOLIC ACID 5 MG/ML IJ SOLN
1.0000 mg | Freq: Every day | INTRAMUSCULAR | Status: DC
  Administered 2019-02-28 – 2019-03-02 (×3): 1 mg via INTRAVENOUS
  Filled 2019-02-28 (×4): qty 0.2

## 2019-02-28 MED ORDER — ORAL CARE MOUTH RINSE
15.0000 mL | OROMUCOSAL | Status: DC
  Administered 2019-02-28 – 2019-03-02 (×25): 15 mL via OROMUCOSAL

## 2019-02-28 MED ORDER — SULFAMETHOXAZOLE-TRIMETHOPRIM 400-80 MG PO TABS
1.0000 | ORAL_TABLET | ORAL | Status: DC
  Administered 2019-03-01 – 2019-03-08 (×4): 1 via ORAL
  Filled 2019-02-28 (×4): qty 1

## 2019-02-28 MED ORDER — VITAMIN K1 10 MG/ML IJ SOLN
5.0000 mg | Freq: Once | INTRAMUSCULAR | Status: AC
  Administered 2019-02-28: 5 mg via SUBCUTANEOUS
  Filled 2019-02-28 (×3): qty 0.5

## 2019-02-28 MED ORDER — CHLORHEXIDINE GLUCONATE 0.12% ORAL RINSE (MEDLINE KIT)
15.0000 mL | Freq: Two times a day (BID) | OROMUCOSAL | Status: DC
  Administered 2019-02-28 – 2019-03-02 (×5): 15 mL via OROMUCOSAL

## 2019-02-28 MED ORDER — THIAMINE HCL 100 MG/ML IJ SOLN
500.0000 mg | Freq: Two times a day (BID) | INTRAVENOUS | Status: AC
  Administered 2019-02-28 – 2019-03-01 (×4): 500 mg via INTRAVENOUS
  Filled 2019-02-28 (×4): qty 5

## 2019-02-28 MED ORDER — FENTANYL CITRATE (PF) 100 MCG/2ML IJ SOLN
25.0000 ug | Freq: Once | INTRAMUSCULAR | Status: AC
  Administered 2019-02-28: 25 ug via INTRAVENOUS
  Filled 2019-02-28: qty 2

## 2019-02-28 MED ORDER — PLASMA-LYTE A IV SOLN
INTRAVENOUS | Status: DC
  Administered 2019-02-28: 06:00:00 via INTRAVENOUS

## 2019-02-28 MED ORDER — FENTANYL 2500MCG IN NS 250ML (10MCG/ML) PREMIX INFUSION
25.0000 ug/h | INTRAVENOUS | Status: DC
  Administered 2019-02-28: 22:00:00 200 ug/h via INTRAVENOUS
  Administered 2019-02-28: 13:00:00 50 ug/h via INTRAVENOUS
  Administered 2019-03-01 (×2): 200 ug/h via INTRAVENOUS
  Filled 2019-02-28 (×3): qty 250

## 2019-02-28 MED ORDER — ETOMIDATE 2 MG/ML IV SOLN
INTRAVENOUS | Status: AC
  Filled 2019-02-28: qty 10

## 2019-02-28 MED ORDER — CHLORHEXIDINE GLUCONATE CLOTH 2 % EX PADS
6.0000 | MEDICATED_PAD | Freq: Every day | CUTANEOUS | Status: DC
  Administered 2019-02-28: 09:00:00 6 via TOPICAL

## 2019-02-28 MED ORDER — SODIUM CHLORIDE 0.9 % IV SOLN
500.0000 mg | Freq: Every day | INTRAVENOUS | Status: DC
  Administered 2019-02-28 (×2): 500 mg via INTRAVENOUS
  Filled 2019-02-28 (×3): qty 500

## 2019-02-28 MED ORDER — NOREPINEPHRINE 4 MG/250ML-% IV SOLN
INTRAVENOUS | Status: AC
  Filled 2019-02-28: qty 250

## 2019-02-28 MED ORDER — HYDROCORTISONE NA SUCCINATE PF 100 MG IJ SOLR
50.0000 mg | Freq: Four times a day (QID) | INTRAMUSCULAR | Status: DC
  Administered 2019-02-28 – 2019-03-03 (×15): 50 mg via INTRAVENOUS
  Filled 2019-02-28 (×14): qty 2

## 2019-02-28 MED ORDER — PROPOFOL 1000 MG/100ML IV EMUL
INTRAVENOUS | Status: AC
  Filled 2019-02-28: qty 100

## 2019-02-28 MED ORDER — THIAMINE HCL 100 MG/ML IJ SOLN
250.0000 mg | Freq: Two times a day (BID) | INTRAVENOUS | Status: DC
  Administered 2019-03-02 – 2019-03-03 (×3): 250 mg via INTRAVENOUS
  Filled 2019-02-28 (×4): qty 2.5

## 2019-02-28 MED ORDER — PROPOFOL 1000 MG/100ML IV EMUL
5.0000 ug/kg/min | INTRAVENOUS | Status: DC
  Administered 2019-02-28: 20:00:00 15 ug/kg/min via INTRAVENOUS
  Administered 2019-03-01: 25 ug/kg/min via INTRAVENOUS
  Filled 2019-02-28 (×2): qty 100

## 2019-02-28 MED ORDER — MIDAZOLAM HCL 2 MG/2ML IJ SOLN
1.0000 mg | INTRAMUSCULAR | Status: DC | PRN
  Administered 2019-02-28 – 2019-03-01 (×9): 2 mg via INTRAVENOUS
  Filled 2019-02-28 (×11): qty 2

## 2019-02-28 MED ORDER — FENTANYL CITRATE (PF) 100 MCG/2ML IJ SOLN
INTRAMUSCULAR | Status: AC
  Administered 2019-02-28: 05:00:00 100 ug
  Filled 2019-02-28: qty 2

## 2019-02-28 MED ORDER — NOREPINEPHRINE 4 MG/250ML-% IV SOLN
0.0000 ug/min | INTRAVENOUS | Status: DC
  Administered 2019-02-28: 25 ug/min via INTRAVENOUS
  Administered 2019-02-28: 14:00:00 20 ug/min via INTRAVENOUS
  Administered 2019-02-28: 06:00:00 30 ug/min via INTRAVENOUS
  Administered 2019-03-01: 6 ug/min via INTRAVENOUS
  Filled 2019-02-28 (×6): qty 250

## 2019-02-28 MED ORDER — PROPOFOL 1000 MG/100ML IV EMUL
INTRAVENOUS | Status: AC
  Administered 2019-02-28: 20:00:00 15 ug/kg/min via INTRAVENOUS
  Filled 2019-02-28: qty 100

## 2019-02-28 MED ORDER — INSULIN ASPART 100 UNIT/ML ~~LOC~~ SOLN
0.0000 [IU] | SUBCUTANEOUS | Status: DC
  Administered 2019-02-28: 4 [IU] via SUBCUTANEOUS
  Administered 2019-02-28: 09:00:00 3 [IU] via SUBCUTANEOUS
  Administered 2019-02-28 – 2019-03-01 (×2): 4 [IU] via SUBCUTANEOUS
  Administered 2019-03-01: 3 [IU] via SUBCUTANEOUS
  Administered 2019-03-01: 01:00:00 4 [IU] via SUBCUTANEOUS
  Administered 2019-03-01: 12:00:00 3 [IU] via SUBCUTANEOUS
  Administered 2019-03-01 – 2019-03-02 (×2): 4 [IU] via SUBCUTANEOUS
  Administered 2019-03-02: 3 [IU] via SUBCUTANEOUS
  Administered 2019-03-02 (×2): 4 [IU] via SUBCUTANEOUS
  Administered 2019-03-02: 7 [IU] via SUBCUTANEOUS
  Administered 2019-03-02 (×2): 4 [IU] via SUBCUTANEOUS
  Administered 2019-03-03: 04:00:00 3 [IU] via SUBCUTANEOUS
  Administered 2019-03-03: 7 [IU] via SUBCUTANEOUS
  Administered 2019-03-03: 4 [IU] via SUBCUTANEOUS
  Administered 2019-03-03: 13:00:00 3 [IU] via SUBCUTANEOUS
  Administered 2019-03-04 (×5): 4 [IU] via SUBCUTANEOUS
  Administered 2019-03-04 – 2019-03-05 (×2): 11 [IU] via SUBCUTANEOUS
  Administered 2019-03-05: 7 [IU] via SUBCUTANEOUS
  Administered 2019-03-05 (×2): 3 [IU] via SUBCUTANEOUS
  Administered 2019-03-05: 4 [IU] via SUBCUTANEOUS
  Administered 2019-03-05: 3 [IU] via SUBCUTANEOUS
  Administered 2019-03-06: 7 [IU] via SUBCUTANEOUS
  Administered 2019-03-06: 4 [IU] via SUBCUTANEOUS
  Administered 2019-03-06: 3 [IU] via SUBCUTANEOUS
  Administered 2019-03-06 – 2019-03-07 (×2): 11 [IU] via SUBCUTANEOUS
  Administered 2019-03-07: 12:00:00 7 [IU] via SUBCUTANEOUS
  Administered 2019-03-07: 4 [IU] via SUBCUTANEOUS
  Administered 2019-03-07 – 2019-03-08 (×2): 7 [IU] via SUBCUTANEOUS
  Administered 2019-03-08: 17:00:00 15 [IU] via SUBCUTANEOUS
  Administered 2019-03-08: 05:00:00 4 [IU] via SUBCUTANEOUS
  Administered 2019-03-08: 15 [IU] via SUBCUTANEOUS

## 2019-02-28 MED ORDER — MYCOPHENOLATE SODIUM 180 MG PO TBEC: 360.0000 mg | DELAYED_RELEASE_TABLET | Freq: Two times a day (BID) | ORAL | Status: DC

## 2019-02-28 MED ORDER — ATORVASTATIN CALCIUM 10 MG PO TABS
10.0000 mg | ORAL_TABLET | Freq: Every day | ORAL | Status: DC
  Administered 2019-03-01 – 2019-03-08 (×8): 10 mg via ORAL
  Filled 2019-02-28 (×9): qty 1

## 2019-02-28 MED ORDER — SODIUM CHLORIDE 0.9 % IV SOLN
INTRAVENOUS | Status: DC | PRN
  Administered 2019-02-28: 500 mL via INTRAVENOUS

## 2019-02-28 MED ORDER — TACROLIMUS 1 MG PO CAPS
2.0000 mg | ORAL_CAPSULE | Freq: Two times a day (BID) | ORAL | Status: DC
  Administered 2019-03-01 – 2019-03-08 (×15): 2 mg via ORAL
  Filled 2019-02-28 (×15): qty 2

## 2019-02-28 MED ORDER — SODIUM BICARBONATE 8.4 % IV SOLN
INTRAVENOUS | Status: DC
  Administered 2019-02-28 – 2019-03-02 (×4): via INTRAVENOUS
  Filled 2019-02-28 (×6): qty 150

## 2019-02-28 MED ORDER — DEXTROSE 50 % IV SOLN
INTRAVENOUS | Status: AC
  Administered 2019-02-28: 25 mL
  Filled 2019-02-28: qty 50

## 2019-02-28 MED ORDER — ORAL CARE MOUTH RINSE: 15.0000 mL | Freq: Two times a day (BID) | OROMUCOSAL | Status: DC

## 2019-02-28 MED ORDER — MYCOPHENOLATE MOFETIL HCL 500 MG IV SOLR
500.0000 mg | Freq: Two times a day (BID) | INTRAVENOUS | Status: DC
  Administered 2019-02-28: 500 mg via INTRAVENOUS
  Filled 2019-02-28 (×2): qty 15

## 2019-02-28 MED ORDER — ETOMIDATE 2 MG/ML IV SOLN
INTRAVENOUS | Status: AC
  Administered 2019-02-28: 20 mg
  Filled 2019-02-28: qty 10

## 2019-02-28 MED ORDER — SODIUM CHLORIDE 0.9% IV SOLUTION
Freq: Once | INTRAVENOUS | Status: AC
  Administered 2019-02-28: 09:00:00 via INTRAVENOUS

## 2019-02-28 MED ORDER — MIDAZOLAM HCL 2 MG/2ML IJ SOLN
INTRAMUSCULAR | Status: AC
  Administered 2019-02-28: 4 mg
  Filled 2019-02-28: qty 4

## 2019-02-28 MED ORDER — FENTANYL CITRATE (PF) 100 MCG/2ML IJ SOLN
INTRAMUSCULAR | Status: AC
  Filled 2019-02-28: qty 2

## 2019-02-28 MED ORDER — MAGNESIUM SULFATE 4 GM/100ML IV SOLN
4.0000 g | Freq: Once | INTRAVENOUS | Status: AC
  Administered 2019-02-28: 11:00:00 4 g via INTRAVENOUS
  Filled 2019-02-28: qty 100

## 2019-02-28 MED ORDER — FENTANYL CITRATE (PF) 100 MCG/2ML IJ SOLN
25.0000 ug | INTRAMUSCULAR | Status: DC | PRN
  Administered 2019-02-28 – 2019-03-01 (×4): 100 ug via INTRAVENOUS
  Administered 2019-03-01 (×2): 50 ug via INTRAVENOUS
  Filled 2019-02-28 (×2): qty 2

## 2019-02-28 MED ORDER — PROPOFOL 1000 MG/100ML IV EMUL
5.0000 ug/kg/min | INTRAVENOUS | Status: DC
  Administered 2019-02-28: 06:00:00 10 ug/kg/min via INTRAVENOUS
  Filled 2019-02-28 (×2): qty 100

## 2019-02-28 NOTE — Procedures (Addendum)
Intubation Procedure Note Ryan Rice [id number] 1951-03-31  Procedure: Intubation Indications: Respiratory insufficiency  Procedure Details Consent: Risks of procedure as well as the alternatives and risks of each were explained to the (patient/caregiver).  Consent for procedure obtained. Time Out: Verified patient identification, verified procedure, site/side was marked, verified correct patient position, special equipment/implants available, medications/allergies/relevent history reviewed, required imaging and test results available.  Performed  Maximum sterile technique was used including antiseptics, cap, gloves, gown, hand hygiene, mask and sheet.  MAC and 4    Evaluation Hemodynamic Status: BP stable throughout; O2 sats: stable throughout Patient's Current Condition: stable Complications: No apparent complications Patient did tolerate procedure well. Chest X-ray ordered to verify placement.  CXR: pending.   Ryan Rice 02/28/2019  Intubated by Dr. Juanda Chance on 02/28/2019 _0 

## 2019-02-28 NOTE — Progress Notes (Signed)
Patient ID: Mikell Kazlauskas, male   DOB: [birth year], 68 y.o.   MRN: [id number] Patient's condition has worsened.  His respiratory rate has increased and his oxygen requirements have worsened.  He has an increased work of breathing and endorses this subjectively. Originally, he did not want intubation but is now accepting of this. He is increased use of accessory muscles.  Blood pressure is soft 88/40 Discussed intubation procedure with patient and he consented and agreed Discussed the need for central access.  It is possible complications and he consented and agreed  Discussed the need for arterial catheter access and its complications and he consented and agreed. Due to the urgent need for intubation he did not sign a physical consent. Inspection of the airway throat shows a thyroid mental distance of at least 3 cm.  He has limited flexion extension of his neck and limited rotation but not significant. He is not mottled.  His skin is warm.   This patient  is critically ill with acute hypoxic respiratory failure and hypotension and requires high complexity decision making for assessment and support, frequent evaluation and titration of therapies, application of advanced monitoring technologies and extensive interpretation of multiple databases.     Critical Care Time devoted to patient care services described in this note is 30 minutes. This time reflects time of care of this author,  Dr. Irene Pap, DO FCCP.  This critical care time does not reflect procedure time, or teaching time or supervisory time of PA/NP/Med student/Med Resident etc but could involve care discussion time        Irene Pap, DO [facility]

## 2019-02-28 NOTE — Procedures (Signed)
Procedure-Intubation  Pre-procedure diagnosis: Acute respiratory failure with hypoxia Post procedure diagnosis: Same Proceduralist: Shailey Butterbaugh, DO FCCP Intubated with: Glide scope video laryngoscopy, #8 ET tube at 24 cm at lip Medications: Rapid sequence intubation with fentanyl 100 mics etomidate 20 mg and rocuronium 100 mg Cormack-Lehane view: 1  [place] Index: I-easy Airway adjuncts  used: Immediately available. Bougie at bedside Complications: None Blood Loss: None CXR: Tip of ETT @pending  at time of this dictation will be commented on if malpositioned  Patient identified with chart check, name bracelet and ongoing critical care. Equipment was immediately available for advanced airway management including intubating bougie and glide scope secondary to the possibility of COVID as well as anesthesia attendance in the hospital. 100% oxygen was given prior to the intubation and maintained. Patient was rapidly intubated with #8 ET tube at 24 cm under a grade 1 [place] view after sufficient muscular relaxation and the black line marker was noted to be through the trachea.. The balloon was inflated but insufflation was not done due to the possibility of COVID.  And tidal CO2 exchange was noted when connected to the ventilator.   Oxygenation maintained greater than 88% Chest x-ray pending  Patient tolerated procedure well  This procedure excludes critical care time Irene Pap, DO FCCP Intenisivist, [facility] [phone] Procedure excludes critical care time  Irene Pap, DO FCCP

## 2019-02-28 NOTE — Progress Notes (Signed)
Rate increased per verbal order of MD.

## 2019-02-28 NOTE — Progress Notes (Signed)
Hypoglycemic Event  CBG: 56  Treatment: 1/2 amp D50  Symptoms: None  Follow-up CBG: Time: 0359 CBG Result:143  Possible Reasons for Event: NPO    Ryan Rice [name]

## 2019-02-28 NOTE — Progress Notes (Signed)
PHARMACY - PHYSICIAN COMMUNICATION CRITICAL VALUE ALERT - BLOOD CULTURE IDENTIFICATION (BCID)  Ryan Rice is an 68 y.o. male who presented to [facility] on 02/27/2019 with a chief complaint of diarrhea.  Found to be hypoxic due to multifocal PNA.    Assessment:  Patient with worsening respiratory status requiring ventilation. Patient on immunosuppressive medications s/p renal transplant. Tmax 102.4 on admit but afebrile today.   Name of physician (or Provider) Contacted:  CCM PharmD will discuss with CCM provider   Current antibiotics: azithromycin, Bactrim, cefepime, vancomycin  Changes to prescribed antibiotics recommended:  Consider discontinuing vancomycin as MEC-A was not detected and staph is covered by cefepime.  Recommendations declined by provider due to worsening clinical status.   Results for orders placed or performed during the hospital encounter of 02/27/19  Blood Culture ID Panel (Reflexed) (Collected: 02/27/2019  4:00 PM)  Result Value Ref Range   Enterococcus species NOT DETECTED NOT DETECTED   Listeria monocytogenes NOT DETECTED NOT DETECTED   Staphylococcus species DETECTED (A) NOT DETECTED   Staphylococcus aureus (BCID) NOT DETECTED NOT DETECTED   Methicillin resistance NOT DETECTED NOT DETECTED   Streptococcus species NOT DETECTED NOT DETECTED   Streptococcus agalactiae NOT DETECTED NOT DETECTED   Streptococcus pneumoniae NOT DETECTED NOT DETECTED   Streptococcus pyogenes NOT DETECTED NOT DETECTED   Acinetobacter baumannii NOT DETECTED NOT DETECTED   Enterobacteriaceae species NOT DETECTED NOT DETECTED   Enterobacter cloacae complex NOT DETECTED NOT DETECTED   Escherichia coli NOT DETECTED NOT DETECTED   Klebsiella oxytoca NOT DETECTED NOT DETECTED   Klebsiella pneumoniae NOT DETECTED NOT DETECTED   Proteus species NOT DETECTED NOT DETECTED   Serratia marcescens NOT DETECTED NOT DETECTED   Haemophilus influenzae NOT DETECTED NOT DETECTED   Neisseria  meningitidis NOT DETECTED NOT DETECTED   Pseudomonas aeruginosa NOT DETECTED NOT DETECTED   Candida albicans NOT DETECTED NOT DETECTED   Candida glabrata NOT DETECTED NOT DETECTED   Candida krusei NOT DETECTED NOT DETECTED   Candida parapsilosis NOT DETECTED NOT DETECTED   Candida tropicalis NOT DETECTED NOT DETECTED    Azzie Roup D PGY1 Pharmacy Resident  Phone [phone] Please use AMION for clinical pharmacists numbers  02/28/2019      12:07 PM

## 2019-02-28 NOTE — Progress Notes (Signed)
ANTICOAGULATION CONSULT NOTE - Initial Consult  Pharmacy Consult for Heparin (warfarin on hold) Indication: History of DVT  Not on File  Patient Measurements: Height: 5\' 11"  (180.3 cm) Weight: 186 lb 15.2 oz (84.8 kg) IBW/kg (Calculated) : 75.3  Vital Signs: Temp: 97.4 F (36.3 C) (06/04 0300) Temp Source: Axillary (06/04 0300) BP: 66/53 (06/04 0500) Pulse Rate: 94 (06/04 0400)  Labs: Recent Labs    02/27/19 1534 02/27/19 1632 02/27/19 1841 02/28/19 0331  HGB 13.9 13.3  --   --   HCT 45.9 39.0  --   --   PLT 119*  --   --   --   LABPROT  --   --   --  61.5*  INR  --   --   --  7.4*  CREATININE 2.01*  --   --   --   TROPONINI 0.19*  --  0.25* 0.24*    Estimated Creatinine Clearance: 37.5 mL/min (A) (by C-G formula based on SCr of 2.01 mg/dL (H)).   Medical History: Past Medical History:  Diagnosis Date  . Kidney transplant recipient    Assessment: Transfer from outside hospital with diarrhea/dyspnea. On warfarin PTA for history of DVT. To begin heparin when INR is <2. INR is currently 7.4.   Goal of Therapy:  Heparin level 0.3-0.7 units/ml Monitor platelets by anticoagulation protocol: Yes   Plan:  -Hold warfarin  -Start heparin when INR is <2  Narda Bonds 02/28/2019,5:37 AM

## 2019-02-28 NOTE — Procedures (Signed)
Procedure-placement of central venous access  Preprocedure diagnosis: Hypotension with sepsis and pneumonia Post procedure diagnosis: Same Type of catheter: Triple-lumen catheter Location: Left IJ Consent: Patient gave verbal consent Proceduralist: , DO FCCP Preparation: Sterile prep with chlorhexidine; allowed to dry.  Full sterile [place] including full body drape, sterile gown sterile gloves nonsterile mask and cap. Anesthesia: Conscious sedation propofol; local anesthetic: None;  Done with sterile ultrasound guidance: Yes Wire seen within the internal jugular: Yes Sutured times 4 Reapplication of chlorhexidine and sterile dressing applied: Yes Estimated blood loss: 5 mL Chest xray shows tip of cather SVC/RA junction Complications: None  Patient was appropriate identified secondary to chart check name bracelet.  This catheter was placed under sterile and urgent conditions secondary to hypotension conditions.  The left IJ was prepped and draped in sterile fashion allowing the chlorhexidine to dry.  Using sterile ultrasound guidance the IJ was insonated both in longitudinal and sagittal plane.  A Quinke-type Seldinger needle was placed into the left internal jugular vein.  Venous blood was obtained. After this a wire was inserted through the needle to approximately 25 to 30 cm. The needle was removed and the wire was insonated with sterile ultrasound probe to determine if it was in the intravascular lumen.  The wire was clearly seen in luminal vein.  After this a small incision was made in the skin to facilitate placement of the dilator which was done without difficulty.  After the dilator was placed pressure was held while a triple-lumen type catheter was placed.  At 17 cm Biopatch was placed Nonpulsatile venous blood was obtained from all lumens.  These were then flushed with sterile saline. The catheter was sutured in  place X 4 The area was cleansed and reapplication of chlorhexidine occurred was allowed to dry.  A sterile central line dressing was then applied without difficulty. Chest x-ray shows no pneumothorax and tip noted at RA-SVC junction There were no complications  This procedure excludes CC time  Irene Pap, DO [facility] Intensivist [phone]

## 2019-02-28 NOTE — Consult Note (Signed)
[facility] Renal Consultation Note  Requesting MD: Byrum Indication for Consultation: s/p renal transplant- A on CRF in the setting of sepsis  HPI:  Ryan Rice is a 68 y.o. 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 Lajuan Lines-  There is note in system from [facility] who indicate that his baseline crt is between 1.5 and 1.7 after some rejection early on req ivig and rituxan- crt on 6/1 was 1.44. On prograf 2/2, myfortic 360 BID and pred 10.  He presented to the ER last evening with c/o SOB- found to be hypoxic- recent abdominal pain.  He decompensated quickly requiring intubation/pressors.  Initial covid test was negative - sending out for confirmation.  CXR shows PNA-  Blood cultures positive for staph and GNR.  Urine does not appear infected.  Initial crt was 2, then 1.92- making some urine- does have a metabolic acidosis.  He does appear to have a patent AVF.  History is obtained from the chart   Creatinine, Ser  Date/Time Value Ref Range Status  02/28/2019 08:57 AM 1.92 (H) 0.61 - 1.24 mg/dL Final  02/27/2019 03:34 PM 2.01 (H) 0.61 - 1.24 mg/dL Final     PMHx:   Past Medical History:  Diagnosis Date  . Kidney transplant recipient     History reviewed. No pertinent surgical history.  Family Hx: No family history on file.  Social History:  has no history on file for tobacco, alcohol, and drug.  Allergies: Not on File  Medications: Prior to Admission medications   Medication Sig Start Date End Date Taking? Authorizing Provider  atorvastatin (LIPITOR) 10 MG tablet Take 10 mg by mouth daily.   Yes [provider]  cinacalcet (SENSIPAR) 60 MG tablet Take 60 mg by mouth daily.   Yes [provider]  diclofenac sodium (VOLTAREN) 1 % GEL Apply topically 4 (four) times daily.   Yes [provider]  famotidine (PEPCID) 20 MG tablet Take 20 mg by mouth 2 (two)  times daily.   Yes [provider]  finasteride (PROSCAR) 5 MG tablet Take 5 mg by mouth daily.   Yes [provider]  labetalol (NORMODYNE) 100 MG tablet Take 100 mg by mouth 2 (two) times daily.   Yes [provider]  magnesium oxide (MAG-OX) 400 MG tablet Take 400 mg by mouth daily.   Yes [provider]  metFORMIN (GLUCOPHAGE) 500 MG tablet Take by mouth 2 (two) times daily with a meal.   Yes [provider]  mycophenolate (MYFORTIC) 180 MG EC tablet Take 180 mg by mouth 2 (two) times daily.   Yes [provider]  nateglinide (STARLIX) 120 MG tablet Take 120 mg by mouth 3 (three) times daily with meals.   Yes [provider]  NIFEdipine (PROCARDIA XL/NIFEDICAL XL) 60 MG 24 hr tablet Take 60 mg by mouth daily.   Yes [provider]  pioglitazone (ACTOS) 30 MG tablet Take 30 mg by mouth daily.   Yes [provider]  predniSONE (DELTASONE) 10 MG tablet Take 10 mg by mouth daily with breakfast.   Yes [provider]  sitaGLIPtin (JANUVIA) 100 MG tablet Take 100 mg by mouth daily.   Yes [provider]  sulfamethoxazole-trimethoprim (BACTRIM) 400-80 MG tablet Take 1 tablet by mouth 2 (two) times daily.   Yes [provider]  tacrolimus (PROGRAF) 1 MG capsule Take 1 mg by  mouth 2 (two) times daily.   Yes [provider]  tamsulosin (FLOMAX) 0.4 MG CAPS capsule Take 0.4 mg by mouth.   Yes [provider]  warfarin (COUMADIN) 5 MG tablet Take 5 mg by mouth daily.   Yes [provider]    I have reviewed the patient's current medications.  Labs:  Results for orders placed or performed during the hospital encounter of 02/27/19 (from the past 48 hour(s))  CBC     Status: Abnormal   Collection Time: 02/27/19  3:34 PM  Result Value Ref Range   WBC 3.8 (L) 4.0 - 10.5 K/uL   RBC 5.17 4.22 - 5.81 MIL/uL   Hemoglobin 13.9 13.0 - 17.0 g/dL   HCT 45.9 39.0 - 52.0 %   MCV  88.8 80.0 - 100.0 fL   MCH 26.9 26.0 - 34.0 pg   MCHC 30.3 30.0 - 36.0 g/dL   RDW 15.9 (H) 11.5 - 15.5 %   Platelets 119 (L) 150 - 400 K/uL    Comment: PLATELET COUNT CONFIRMED BY SMEAR PLATELETS APPEAR DECREASED SPECIMEN CHECKED FOR CLOTS    nRBC 0.0 0.0 - 0.2 %    Comment: Performed at [facility], [address] [place], [place] [address]  Comprehensive metabolic panel     Status: Abnormal   Collection Time: 02/27/19  3:34 PM  Result Value Ref Range   Sodium 138 135 - 145 mmol/L   Potassium 4.2 3.5 - 5.1 mmol/L   Chloride 109 98 - 111 mmol/L   CO2 18 (L) 22 - 32 mmol/L   Glucose, Bld 147 (H) 70 - 99 mg/dL   BUN 32 (H) 8 - 23 mg/dL   Creatinine, Ser 2.01 (H) 0.61 - 1.24 mg/dL   Calcium 9.4 8.9 - 10.3 mg/dL   Total Protein 6.0 (L) 6.5 - 8.1 g/dL   Albumin 3.3 (L) 3.5 - 5.0 g/dL   AST 20 15 - 41 U/L   ALT 15 0 - 44 U/L   Alkaline Phosphatase 41 38 - 126 U/L   Total Bilirubin 2.0 (H) 0.3 - 1.2 mg/dL   GFR calc non Af Amer 33 (L) >60 mL/min   GFR calc Af Amer 38 (L) >60 mL/min   Anion gap 11 5 - 15    Comment: Performed at [facility], [address] [place], [place] [address]  Troponin I - ONCE - STAT     Status: Abnormal   Collection Time: 02/27/19  3:34 PM  Result Value Ref Range   Troponin I 0.19 (HH) <0.03 ng/mL    Comment: CRITICAL RESULT CALLED TO, READ BACK BY AND VERIFIED WITH: MARVA SIMMS RN AT 1779 ON 02/27/19 BY I.SUGUT Performed at [facility], [address] [place], [place] [address]   Lipase, blood     Status: None   Collection Time: 02/27/19  3:34 PM  Result Value Ref Range   Lipase 32 11 - 51 U/L    Comment: Performed at [facility], [address] [place], [place] [address]  Brain natriuretic peptide     Status: Abnormal   Collection Time: 02/27/19  3:34 PM  Result Value Ref Range   B Natriuretic Peptide 597.6 (H) 0.0 - 100.0 pg/mL    Comment: Performed at [facility], [address]  [place], [place] [address]  Blood culture (routine x 2)     Status: None (Preliminary result)  Collection Time: 02/27/19  3:35 PM  Result Value Ref Range   Specimen Description      BLOOD RIGHT ANTECUBITAL Performed at [facility], [place] [address], [place], [place] [address]    Special Requests      BOTTLES DRAWN AEROBIC AND ANAEROBIC Blood Culture adequate volume Performed at [facility], [address] [place], [place] [address]    Culture  Setup Time      GRAM NEGATIVE RODS AEROBIC BOTTLE ONLY CRITICAL VALUE NOTED.  VALUE IS CONSISTENT WITH PREVIOUSLY REPORTED AND CALLED VALUE. Performed at [facility], [place] [address], [place], [place] [address]    Culture GRAM NEGATIVE RODS    Report Status PENDING   Lactic acid, plasma     Status: Abnormal   Collection Time: 02/27/19  3:59 PM  Result Value Ref Range   Lactic Acid, Venous 3.9 (HH) 0.5 - 1.9 mmol/L    Comment: CRITICAL RESULT CALLED TO, READ BACK BY AND VERIFIED WITH: MARVA SIMMS RN AT 0938 ON 02/27/19 BY I.SUGUT Performed at [facility], [address] [place], [place] [address]   Blood culture (routine x 2)     Status: None (Preliminary result)   Collection Time: 02/27/19  4:00 PM  Result Value Ref Range   Specimen Description      BLOOD RIGHT HAND Performed at [facility], [address] [place], [place] [address]    Special Requests      BOTTLES DRAWN AEROBIC AND ANAEROBIC Blood Culture adequate volume Performed at [facility], [address] [place], [place] [address]    Culture  Setup Time      GRAM NEGATIVE RODS AEROBIC BOTTLE ONLY GRAM POSITIVE COCCI CRITICAL RESULT CALLED TO, READ BACK BY AND VERIFIED WITH: PHARMD A LOVE [id number] AT 22 BY CM Performed at [facility], [place] [address], [place], [place] [address]    Culture GRAM NEGATIVE RODS    Report Status PENDING   Blood Culture ID Panel (Reflexed)     Status: Abnormal   Collection Time:  02/27/19  4:00 PM  Result Value Ref Range   Enterococcus species NOT DETECTED NOT DETECTED   Listeria monocytogenes NOT DETECTED NOT DETECTED   Staphylococcus species DETECTED (A) NOT DETECTED    Comment: Methicillin (oxacillin) susceptible coagulase negative staphylococcus. Possible blood culture contaminant (unless isolated from more than one blood culture draw or clinical case suggests pathogenicity). No antibiotic treatment is indicated for blood  culture contaminants. CRITICAL RESULT CALLED TO, READ BACK BY AND VERIFIED WITH: PHARMD A LOVE [id number] AT 4235 BY CM    Staphylococcus aureus (BCID) NOT DETECTED NOT DETECTED   Methicillin resistance NOT DETECTED NOT DETECTED   Streptococcus species NOT DETECTED NOT DETECTED   Streptococcus agalactiae NOT DETECTED NOT DETECTED   Streptococcus pneumoniae NOT DETECTED NOT DETECTED   Streptococcus pyogenes NOT DETECTED NOT DETECTED   Acinetobacter baumannii NOT DETECTED NOT DETECTED   Enterobacteriaceae species NOT DETECTED NOT DETECTED   Enterobacter cloacae complex NOT DETECTED NOT DETECTED   Escherichia coli NOT DETECTED NOT DETECTED   Klebsiella oxytoca NOT DETECTED NOT DETECTED   Klebsiella pneumoniae NOT DETECTED NOT DETECTED   Proteus species NOT DETECTED NOT DETECTED   Serratia marcescens NOT DETECTED NOT DETECTED   Haemophilus influenzae NOT DETECTED NOT DETECTED   Neisseria meningitidis NOT DETECTED NOT DETECTED   Pseudomonas aeruginosa NOT DETECTED NOT DETECTED   Candida albicans  NOT DETECTED NOT DETECTED   Candida glabrata NOT DETECTED NOT DETECTED   Candida krusei NOT DETECTED NOT DETECTED   Candida parapsilosis NOT DETECTED NOT DETECTED   Candida tropicalis NOT DETECTED NOT DETECTED    Comment: Performed at [facility], [place] [address] [place], [place] [address]  SARS Coronavirus 2 (Hosp order,Performed in [facility] lab via [employer] ID)     Status: None   Collection Time: 02/27/19  4:15 PM  Result Value Ref Range    SARS Coronavirus 2 ([employer] ID Now) NEGATIVE NEGATIVE    Comment: (NOTE) Interpretive Result Comment(s): COVID 19 Positive SARS CoV 2 target nucleic acids are DETECTED. The SARS CoV 2 RNA is generally detectable in upper and lower respiratory specimens during the acute phase of infection.  Positive results are indicative of active infection with SARS CoV 2.  Clinical correlation with patient history and other diagnostic information is necessary to determine patient infection status.  Positive results do not rule out bacterial infection or coinfection with other viruses. The expected result is Negative. COVID 19 Negative SARS CoV 2 target nucleic acids are NOT DETECTED. The SARS CoV 2 RNA is generally detectable in upper and lower respiratory specimens during the acute phase of infection.  Negative results do not preclude SARS CoV 2 infection, do not rule out coinfections with other pathogens, and should not be used as the sole basis for treatment or other patient management decisions.  Negative results must be combined with clinical  observations, patient history, and epidemiological information. The expected result is Negative. Invalid Presence or absence of SARS CoV 2 nucleic acids cannot be determined. Repeat testing was performed on the submitted specimen and repeated Invalid results were obtained.  If clinically indicated, additional testing on a new specimen with an alternate test methodology [phone]) is advised.  The SARS CoV 2 RNA is generally detectable in upper and lower respiratory specimens during the acute phase of infection. The expected result is Negative. Fact Sheet for Patients:  GolfingFamily.no Fact Sheet for Healthcare Providers: https://www.hernandez-brewer.com/ This test is not yet approved or cleared by the [place] FDA and has been authorized for detection and/or diagnosis of SARS CoV 2 by FDA under an Emergency Use  Authorization (EUA).  This EUA will remain in effect (meaning this test can be used) for the duration of the COVID19 d eclaration under Section 564(b)(1) of the Act, 21 U.S.C. section [phone] 3(b)(1), unless the authorization is terminated or revoked sooner. Performed at [facility], [address] [place], [place] [address]   I-STAT 7, (LYTES, BLD GAS, ICA, H+H)     Status: Abnormal   Collection Time: 02/27/19  4:32 PM  Result Value Ref Range   pH, Arterial 7.406 7.350 - 7.450   pCO2 arterial 19.4 (LL) 32.0 - 48.0 mmHg   pO2, Arterial 74.0 (L) 83.0 - 108.0 mmHg   Bicarbonate 12.0 (L) 20.0 - 28.0 mmol/L   TCO2 12 (L) 22 - 32 mmol/L   O2 Saturation 94.0 %   Acid-base deficit 10.0 (H) 0.0 - 2.0 mmol/L   Sodium 136 135 - 145 mmol/L   Potassium 3.5 3.5 - 5.1 mmol/L   Calcium, Ion 1.32 1.15 - 1.40 mmol/L   HCT 39.0 39.0 - 52.0 %   Hemoglobin 13.3 13.0 - 17.0 g/dL   Patient temperature 102.4 F    Collection site RADIAL, ALLEN'S TEST ACCEPTABLE    Drawn by RT    Sample type ARTERIAL  Comment NOTIFIED PHYSICIAN   Urinalysis, Routine w reflex microscopic     Status: Abnormal   Collection Time: 02/27/19  5:25 PM  Result Value Ref Range   Color, Urine YELLOW YELLOW   APPearance CLEAR CLEAR   Specific Gravity, Urine 1.025 1.005 - 1.030   pH 5.5 5.0 - 8.0   Glucose, UA NEGATIVE NEGATIVE mg/dL   Hgb urine dipstick NEGATIVE NEGATIVE   Bilirubin Urine NEGATIVE NEGATIVE   Ketones, ur 15 (A) NEGATIVE mg/dL   Protein, ur NEGATIVE NEGATIVE mg/dL   Nitrite NEGATIVE NEGATIVE   Leukocytes,Ua NEGATIVE NEGATIVE    Comment: Microscopic not done on urines with negative protein, blood, leukocytes, nitrite, or glucose < 500 mg/dL. Performed at [facility], [address] [place], [place] [address]   Lactic acid, plasma     Status: Abnormal   Collection Time: 02/27/19  6:30 PM  Result Value Ref Range   Lactic Acid, Venous 4.0 (HH) 0.5 - 1.9 mmol/L    Comment: CRITICAL  RESULT CALLED TO, READ BACK BY AND VERIFIED WITH: MARVA SIMMS RN AT [place] ON 02/27/19 BY I.SUGUT Performed at [facility], [address] [place], [place] [address]   Troponin I - Once     Status: Abnormal   Collection Time: 02/27/19  6:41 PM  Result Value Ref Range   Troponin I 0.25 (HH) <0.03 ng/mL    Comment: CRITICAL VALUE NOTED.  VALUE IS CONSISTENT WITH PREVIOUSLY REPORTED AND CALLED VALUE. Performed at [facility], [address] [place], [place] [address]   MRSA PCR Screening     Status: None   Collection Time: 02/28/19 12:32 AM  Result Value Ref Range   MRSA by PCR NEGATIVE NEGATIVE    Comment:        The GeneXpert MRSA Assay (FDA approved for NASAL specimens only), is one component of a comprehensive MRSA colonization surveillance program. It is not intended to diagnose MRSA infection nor to guide or monitor treatment for MRSA infections. Performed at [facility], [place] [address], [place], [place] [address]   Glucose, capillary     Status: None   Collection Time: 02/28/19 12:40 AM  Result Value Ref Range   Glucose-Capillary 86 70 - 99 mg/dL  Strep pneumoniae urinary antigen     Status: None   Collection Time: 02/28/19  1:01 AM  Result Value Ref Range   Strep Pneumo Urinary Antigen NEGATIVE NEGATIVE    Comment: Performed at [facility], [place] [address], [place], [place] [address]  Glucose, capillary     Status: Abnormal   Collection Time: 02/28/19  3:24 AM  Result Value Ref Range   Glucose-Capillary 56 (L) 70 - 99 mg/dL  Troponin I - Now Then Q6H     Status: Abnormal   Collection Time: 02/28/19  3:31 AM  Result Value Ref Range   Troponin I 0.24 (HH) <0.03 ng/mL    Comment: CRITICAL RESULT CALLED TO, READ BACK BY AND VERIFIED WITH: J.PARRISH,RN 7782 02/28/2019 M.CAMPBELL Performed at [facility], [place] [address] [place], [place] [address]   Lactic acid, plasma     Status: Abnormal   Collection Time: 02/28/19  3:31 AM   Result Value Ref Range   Lactic Acid, Venous 2.8 (HH) 0.5 - 1.9 mmol/L    Comment: CRITICAL RESULT CALLED TO, READ BACK BY AND VERIFIED WITH: N.DAWKINS,RN 6144 02/28/2019 M.CAMPBELL Performed at [facility], [place]  [address], [place], [place] [address]   Protime-INR     Status: Abnormal   Collection Time: 02/28/19  3:31 AM  Result Value Ref Range   Prothrombin Time 61.5 (H) 11.4 - 15.2 seconds   INR 7.4 (HH) 0.8 - 1.2    Comment: REPEATED TO VERIFY CRITICAL RESULT CALLED TO, READ BACK BY AND VERIFIED WITH: TARRISH J AT 0410 ON 02/28/2019 BY SAINVILUS S (NOTE) INR goal varies based on device and disease states. Performed at [facility], [place] [address], [place], [place] [address]   Salicylate level     Status: None   Collection Time: 02/28/19  3:31 AM  Result Value Ref Range   Salicylate Lvl <5.0 2.8 - 30.0 mg/dL    Comment: Performed at [facility] [address], [place], [place] [address]  Procalcitonin - Baseline     Status: None   Collection Time: 02/28/19  3:31 AM  Result Value Ref Range   Procalcitonin 71.92 ng/mL    Comment:        Interpretation: PCT >= 10 ng/mL: Important systemic inflammatory response, almost exclusively due to severe bacterial sepsis or septic shock. (NOTE)       Sepsis PCT Algorithm           Lower Respiratory Tract                                      Infection PCT Algorithm    ----------------------------     ----------------------------         PCT < 0.25 ng/mL                PCT < 0.10 ng/mL         Strongly encourage             Strongly discourage   discontinuation of antibiotics    initiation of antibiotics    ----------------------------     -----------------------------       PCT 0.25 - 0.50 ng/mL            PCT 0.10 - 0.25 ng/mL               OR       >80% decrease in PCT            Discourage initiation of                                            antibiotics      Encourage discontinuation           of  antibiotics    ----------------------------     -----------------------------         PCT >= 0.50 ng/mL              PCT 0.26 - 0.50 ng/mL                AND       <80% decrease in PCT             Encourage initiation of  antibiotics       Encourage continuation           of antibiotics    ----------------------------     -----------------------------        PCT >= 0.50 ng/mL                  PCT > 0.50 ng/mL               AND         increase in PCT                  Strongly encourage                                      initiation of antibiotics    Strongly encourage escalation           of antibiotics                                     -----------------------------                                           PCT <= 0.25 ng/mL                                                 OR                                        > 80% decrease in PCT                                     Discontinue / Do not initiate                                             antibiotics Performed at [facility], [address] [place], [place] [address]   ABO/Rh     Status: None   Collection Time: 02/28/19  3:31 AM  Result Value Ref Range   ABO/RH(D)      A POS Performed at [facility] [address], [place], [place] [address]   C-reactive protein     Status: Abnormal   Collection Time: 02/28/19  3:31 AM  Result Value Ref Range   CRP 11.7 (H) <1.0 mg/dL    Comment: Performed at [facility] [address], [place], [place] [address]  D-dimer, quantitative (not at [facility])     Status: Abnormal   Collection Time: 02/28/19  3:31 AM  Result Value Ref Range   D-Dimer, Quant 1.16 (H) 0.00 - 0.50 ug/mL-FEU    Comment: (NOTE) At the manufacturer cut-off of 0.50 ug/mL FEU, this assay has been documented to exclude PE with a sensitivity and negative predictive value of 97 to 99%.  At this time, this assay has not been approved by the FDA to exclude  DVT/VTE. Results should be correlated with clinical presentation. Performed at [facility], [place] [address] [place], [place] [address]   Ferritin     Status: Abnormal   Collection Time: 02/28/19  3:31 AM  Result Value Ref Range   Ferritin 344 (H) 24 - 336 ng/mL    Comment: Performed at [facility], [place] [address] [place], [place] [address]  Fibrinogen     Status: None   Collection Time: 02/28/19  3:31 AM  Result Value Ref Range   Fibrinogen 292 210 - 475 mg/dL    Comment: Performed at [facility] [address], [place], [place] [address]  Lactate dehydrogenase     Status: Abnormal   Collection Time: 02/28/19  3:31 AM  Result Value Ref Range   LDH 93 (L) 98 - 192 U/L    Comment: Performed at [facility], [place] [address], [place], [place] [address]  Sedimentation rate     Status: None   Collection Time: 02/28/19  3:31 AM  Result Value Ref Range   Sed Rate 2 0 - 16 mm/hr    Comment: Performed at [facility] [address] [place], [place] [address]  Triglycerides     Status: None   Collection Time: 02/28/19  3:31 AM  Result Value Ref Range   Triglycerides 71 <150 mg/dL    Comment: Performed at [facility] [address], [place], [place] [address]  Glucose, capillary     Status: Abnormal   Collection Time: 02/28/19  3:59 AM  Result Value Ref Range   Glucose-Capillary 143 (H) 70 - 99 mg/dL  CBC     Status: Abnormal   Collection Time: 02/28/19  5:46 AM  Result Value Ref Range   WBC 10.1 4.0 - 10.5 K/uL   RBC 4.36 4.22 - 5.81 MIL/uL   Hemoglobin 12.1 (L) 13.0 - 17.0 g/dL   HCT 39.7 39.0 - 52.0 %   MCV 91.1 80.0 - 100.0 fL   MCH 27.8 26.0 - 34.0 pg   MCHC 30.5 30.0 - 36.0 g/dL   RDW 16.1 (H) 11.5 - 15.5 %   Platelets 86 (L) 150 - 400 K/uL    Comment: REPEATED TO VERIFY Immature Platelet Fraction may be clinically indicated, consider ordering this additional test [id number] CONSISTENT WITH PREVIOUS RESULT    nRBC 0.2 0.0 - 0.2  %    Comment: Performed at [facility], [place] [address] [place], [place] [address]  Magnesium     Status: Abnormal   Collection Time: 02/28/19  5:46 AM  Result Value Ref Range   Magnesium 0.9 (LL) 1.7 - 2.4 mg/dL    Comment: CRITICAL RESULT CALLED TO, READ BACK BY AND VERIFIED WITH: SIERRA GEORGE,RN AT 5427 02/28/2019 BY ZBEECH. Performed at [facility], [place] [address], [place], [place] [address]   Phosphorus     Status: None   Collection Time: 02/28/19  5:46 AM  Result Value Ref Range   Phosphorus 3.9 2.5 - 4.6 mg/dL    Comment: Performed at [facility] [address] [place], [place] [address]  Troponin I - Now Then Q6H     Status: Abnormal   Collection Time: 02/28/19  5:46 AM  Result Value Ref Range   Troponin I 0.34 (HH) <0.03 ng/mL    Comment: CRITICAL VALUE NOTED.  VALUE IS CONSISTENT WITH PREVIOUSLY REPORTED AND CALLED VALUE. Performed at [facility], [place] [address] [place], [place] [address]   PSA     Status: Abnormal   Collection Time: 02/28/19  5:46 AM  Result Value Ref Range   Prostatic Specific Antigen 64.96 (H) 0.00 - 4.00 ng/mL    Comment: (NOTE) While PSA levels of <=4.0 ng/ml are reported as reference range, some men with levels below 4.0 ng/ml can have prostate cancer and many men with PSA above 4.0 ng/ml do not have prostate cancer.  Other tests such as free PSA, age specific reference ranges, PSA velocity and PSA doubling time may be helpful especially in men less than 66 years old. Performed at [facility], [place] [address], [place], [place] [address]   Procalcitonin     Status: None   Collection Time: 02/28/19  5:46 AM  Result Value Ref Range   Procalcitonin 147.88 ng/mL    Comment:        Interpretation: PCT >= 10 ng/mL: Important systemic inflammatory response, almost exclusively due to severe bacterial sepsis or septic shock. (NOTE)       Sepsis PCT Algorithm           Lower Respiratory Tract                                       Infection PCT Algorithm    ----------------------------     ----------------------------         PCT < 0.25 ng/mL                PCT < 0.10 ng/mL         Strongly encourage             Strongly discourage   discontinuation of antibiotics    initiation of antibiotics    ----------------------------     -----------------------------       PCT 0.25 - 0.50 ng/mL            PCT 0.10 - 0.25 ng/mL               OR       >80% decrease in PCT            Discourage initiation of                                            antibiotics      Encourage discontinuation           of antibiotics    ----------------------------     -----------------------------         PCT >= 0.50 ng/mL              PCT 0.26 - 0.50 ng/mL                AND       <80% decrease in PCT             Encourage initiation of                                             antibiotics  Encourage continuation           of antibiotics    ----------------------------     -----------------------------        PCT >= 0.50 ng/mL                  PCT > 0.50 ng/mL               AND         increase in PCT                  Strongly encourage                                      initiation of antibiotics    Strongly encourage escalation           of antibiotics                                     -----------------------------                                           PCT <= 0.25 ng/mL                                                 OR                                        > 80% decrease in PCT                                     Discontinue / Do not initiate                                             antibiotics Performed at [facility], [address], [place], [place] [address]   I-STAT 7, (LYTES, BLD GAS, ICA, H+H)     Status: Abnormal   Collection Time: 02/28/19  7:40 AM  Result Value Ref Range   pH, Arterial 7.179 (LL) 7.350 - 7.450   pCO2 arterial 39.2 32.0 - 48.0 mmHg   pO2, Arterial 152.0 (H)  83.0 - 108.0 mmHg   Bicarbonate 14.6 (L) 20.0 - 28.0 mmol/L   TCO2 16 (L) 22 - 32 mmol/L   O2 Saturation 99.0 %   Acid-base deficit 13.0 (H) 0.0 - 2.0 mmol/L   Sodium 139 135 - 145 mmol/L   Potassium 5.0 3.5 - 5.1 mmol/L   Calcium, Ion 1.23 1.15 - 1.40 mmol/L   HCT 37.0 (L) 39.0 - 52.0 %   Hemoglobin 12.6 (L) 13.0 - 17.0 g/dL   Patient temperature HIDE    Collection site RADIAL, ALLEN'S TEST ACCEPTABLE    Drawn by RT    Sample type ARTERIAL    Comment NOTIFIED PHYSICIAN  Glucose, capillary     Status: Abnormal   Collection Time: 02/28/19  8:14 AM  Result Value Ref Range   Glucose-Capillary 129 (H) 70 - 99 mg/dL  Prepare fresh frozen plasma     Status: None (Preliminary result)   Collection Time: 02/28/19  8:43 AM  Result Value Ref Range   Unit Number [id number]    Blood Component Type THW PLS APHR    Unit division A0    Status of Unit ISSUED    Transfusion Status      OK TO TRANSFUSE Performed at [facility], [place] [address] [place], [place] [address]    Unit Number [id number]    Blood Component Type THAWED PLASMA    Unit division 00    Status of Unit ISSUED    Transfusion Status OK TO TRANSFUSE   Basic metabolic panel     Status: Abnormal   Collection Time: 02/28/19  8:57 AM  Result Value Ref Range   Sodium 138 135 - 145 mmol/L   Potassium 5.0 3.5 - 5.1 mmol/L   Chloride 115 (H) 98 - 111 mmol/L   CO2 13 (L) 22 - 32 mmol/L   Glucose, Bld 156 (H) 70 - 99 mg/dL   BUN 35 (H) 8 - 23 mg/dL   Creatinine, Ser 1.92 (H) 0.61 - 1.24 mg/dL   Calcium 7.7 (L) 8.9 - 10.3 mg/dL   GFR calc non Af Amer 35 (L) >60 mL/min   GFR calc Af Amer 41 (L) >60 mL/min   Anion gap 10 5 - 15    Comment: Performed at [facility] [address], [place], [place] [address]  Magnesium     Status: Abnormal   Collection Time: 02/28/19  8:57 AM  Result Value Ref Range   Magnesium 1.1 (L) 1.7 - 2.4 mg/dL    Comment: Performed at [facility] [address]  [place], [place] [address]  Protime-INR     Status: Abnormal   Collection Time: 02/28/19  8:57 AM  Result Value Ref Range   Prothrombin Time 49.4 (H) 11.4 - 15.2 seconds   INR 5.5 (HH) 0.8 - 1.2    Comment: REPEATED TO VERIFY CRITICAL RESULT CALLED TO, READ BACK BY AND VERIFIED WITH: GEORGE,S RN @1031  ON [id number] BY FLEMINGS (NOTE) INR goal varies based on device and disease states. Performed at [facility], [place] [address], [place], [place] [address]   APTT     Status: Abnormal   Collection Time: 02/28/19  8:57 AM  Result Value Ref Range   aPTT 52 (H) 24 - 36 seconds    Comment:        IF BASELINE aPTT IS ELEVATED, SUGGEST PATIENT RISK ASSESSMENT BE USED TO DETERMINE APPROPRIATE ANTICOAGULANT THERAPY. Performed at [facility], [place] [address] [place], [place] [address]   Glucose, capillary     Status: Abnormal   Collection Time: 02/28/19 11:38 AM  Result Value Ref Range   Glucose-Capillary 119 (H) 70 - 99 mg/dL  I-STAT 7, (LYTES, BLD GAS, ICA, H+H)     Status: Abnormal   Collection Time: 02/28/19 11:55 AM  Result Value Ref Range   pH, Arterial 7.303 (L) 7.350 - 7.450   pCO2 arterial 24.2 (L) 32.0 - 48.0 mmHg   pO2, Arterial 61.0 (L) 83.0 - 108.0 mmHg   Bicarbonate 12.0 (L) 20.0 - 28.0 mmol/L   TCO2 13 (L) 22 - 32 mmol/L   O2 Saturation 89.0 %  Acid-base deficit 12.0 (H) 0.0 - 2.0 mmol/L   Sodium 140 135 - 145 mmol/L   Potassium 4.7 3.5 - 5.1 mmol/L   Calcium, Ion 1.15 1.15 - 1.40 mmol/L   HCT 49.0 39.0 - 52.0 %   Hemoglobin 16.7 13.0 - 17.0 g/dL   Patient temperature HIDE    Collection site RADIAL, ALLEN'S TEST ACCEPTABLE    Drawn by RT    Sample type ARTERIAL      ROS:  Review of systems not obtained due to patient factors.  Physical Exam: Vitals:   02/28/19 1020 02/28/19 1030  BP: 124/81   Pulse: 84 82  Resp: (!) 24 (!) 25  Temp:    SpO2: 100% 99%     Physical exam was not done personally due to patient being in isolation for possible COVD  infection out of concern for PPE supply and to limit further providers exposed.  I did see him thru the glass and have discussed specifics of exam with CCM provider   Assessment/Plan:68 year old BM with history of a kidney transplant and other medical issues now presenting with sepsis/ sepsis/bacteremia 1.Renal- known history of kidney transplant with crt of 1.44 just days ago.  Now with A on CRF in the setting of sepsis.  Due to excellent care from CCM has improved rapidly- more hemodynamically stable.  Making urine- no indications for dialysis at present- hopefully will be able to avoid 2. Sepsis  - bacteremia in an immunocompromised patient.  Broad spectrum antibiotics and supportive care.  Stop myfortic in the setting of active infection 3.  Immune suppression- home reg prograf 2/2- myfortic 360 BID and pred 10-  Is currently on prograf 2/2 and stress dose steroids  4. Metabolic acidosis  - iv bicarb for now 5.  Hyperkalemia- borderline-  Bicarb should help- will check labs tonight  6.  Anemia - does not appear to be an issue right now    Louis Meckel 02/28/2019, 12:02 PM

## 2019-02-28 NOTE — Progress Notes (Addendum)
Initial Nutrition Assessment RD working remotely.  DOCUMENTATION CODES:   Not applicable  INTERVENTION:   If unable to extubate patient within the next 24 hours, recommend begin TF:   Vital AF 1.2 at 80 ml/h (1920 ml per day)   Provides 2304 kcal, 144 gm protein, 1557 ml free water daily  NUTRITION DIAGNOSIS:   Inadequate oral intake related to inability to eat as evidenced by NPO status.  GOAL:   Patient will meet greater than or equal to 90% of their needs  MONITOR:   Vent status, Labs, Skin, I & O's  REASON FOR ASSESSMENT:   Ventilator    ASSESSMENT:   68 yo male with PMH of ESRD s/p renal transplant, DM, HTN, HLD, DVT who was admitted with diarrhea, multifocal PNA, sepsis. Required intubation early 6/4.  COVID-19 negative.  Nephrology following for acute on chronic renal failure in the setting of sepsis. No indications for HD at this time.   Patient is currently intubated on ventilator support MV: 14.2 L/min Temp (24hrs), Avg:98.4 F (36.9 C), Min:97.4 F (36.3 C), Max:102.4 F (39.1 C)   Labs reviewed. BUN 35 (H), creatinine 1.92 (H), magnesium 1.1 (L) CBG's: [phone]  Medications reviewed and include folic acid, Solu-cortef, Novolog, thiamine, Levophed.    NUTRITION - FOCUSED PHYSICAL EXAM:  deferred  Diet Order:   Diet Order            Diet NPO time specified  Diet effective now              EDUCATION NEEDS:   No education needs have been identified at this time  Skin:  Skin Assessment: Reviewed RN Assessment  Last BM:  6/3 (type 6)  Height:   Ht Readings from Last 1 Encounters:  02/28/19 5\' 11"  (1.803 m)    Weight:   Wt Readings from Last 1 Encounters:  02/28/19 84.8 kg    Ideal Body Weight:  78.2 kg  BMI:  Body mass index is 26.07 kg/m.  Estimated Nutritional Needs:   Kcal:  2330  Protein:  125-145 gm  Fluid:  2.3 L    Molli Barrows, RD, LDN, CNSC Pager [phone] After Hours Pager [phone]

## 2019-02-28 NOTE — Progress Notes (Addendum)
NAME:  Ryan Rice, MRN:  [id number], DOB:  June 07, 1951, LOS: 1 ADMISSION DATE:  02/27/2019, CONSULTATION DATE:  02/27/19 REFERRING MD:  [facility]  CHIEF COMPLAINT:  Diarrhea with dyspnea  Brief History   Ryan Rice is a 68 y.o. male with hx renal transplant, presented to [facility] with diarrhea.  Found to be hypoxic due to multifocal PNA.  Transferred to [facility] ICU due to increasing O2 needs. Had transient hypotension. SJZ: Transfer from [facility] for diarrhea and dyspnea  History of present illness   Ryan Rice is a 68 y.o. male who has a PMH including but not limited to ESRD s/p renal transplant, DM, HTN, HLD, DVT on coumadin.  He presented to [facility] 6/3 with abdominal pain.  Had diarrhea that started the night prior after he had cut his grass.  Did report mild SOB and mild non productive cough.  No fevers/chills/sweats.  In ED, he was found to be hypoxic with slightly increased WOB.  He was placed on NRB to maintain SpO2 in 90s.  CXR demonstrated LLL opacity.  CT A/P demonstrated extensive PNA bilaterally L > R.  Initial rapid COVID negative.  Due to increased O2 needs and concern for deterioration, he was transferred to [facility] for further evaluation and management.  SJZ: Post Deceased Donor transplant 01-11-13 at [facility]. Taking his immunosuppressants. No leafy vegetable intake or change in diet. No contacts with diarrhea, no blood in stool.  Dry, non-productive cough. No hemoptysis  Past Medical History  ESRD s/p renal transplant, DM, HTN, HLD, DVT on coumadin. [facility] Events   6/3 > admit.  Consults:  Nephrology.  Procedures:  None.  Significant Diagnostic Tests:  CXR 6/3 > LLL opacity. CT A / P 6/3 > extensive PNA bilaterally L > R.  Small prostatic neoplasm is not excluded.  Possible neoplasm in right kidney, non emergent MRI recommended.  Colonic diverticulosis.,.  Small ventral hernia. US renal transplant 6/4 >  Micro Data:  Blood 6/3 > staph  gnr>> Sputum 6/3 >  Urine 6/2 >>gnr U. Strep 6/4 > neg U. Legionella 6/4 >  SARS CoV2 6/3 > neg. Full PCR resent>> GI stool panel 6/3 >   Antimicrobials:  Vanc 6/3 >  Cefepime 6/3 >  Flagyl (chronic given hx immunosuppression) 6/3 >  6/3 Bactrim>>  Interim history/subjective:  Required intubation early a.m. 02/28/2019 Sepsis placed on vasopressors   Objective:  Blood pressure (!) 141/74, pulse 92, temperature (!) 97.4 F (36.3 C), temperature source Axillary, resp. rate 15, height 5\' 11"  (1.803 m), weight 84.8 kg, SpO2 98 %.    FiO2 (%):  [60 %-100 %] 60 % Set Rate:  [15 bmp-24 bmp] 24 bmp Vt Set:  [600 mL] 600 mL PEEP:  [8 cmH20-10 cmH20] 8 cmH20 Plateau Pressure:  [17 cmH20-21 cmH20] 21 cmH20   Intake/Output Summary (Last 24 hours) at 02/28/2019 0914 Last data filed at 02/28/2019 0700 Gross per 24 hour  Intake 5479.51 ml  Output 550 ml  Net 4929.51 ml   Filed Weights   02/27/19 1531 02/28/19 0036 02/28/19 0352  Weight: 83.9 kg 84.8 kg 84.8 kg    Examination: General: Well-nourished well developed male on full mechanical ventilatory support HEENT: Tracheal tube gastric tube in place Neuro: Sedated on ventilator CV: Regular sinus rhythm PULM:Diminished on left with coarse rhonchi TI:RWER, non-tender, bsx4 active  Extremities: warm/dry, 1+ edema noted AV graft noted. Rt femoral a line noted Skin: no rashes or lesions    Assessment &  Plan:   Multifocal PNA with acute hypoxic respiratory failure. Vent dependent respiratory failure secondary to pneumonia requiring intubation on 02/28/2019 05.30 Day 1 of empirical antibiotics with vancomycin and cefepime , zithro, bactrimin the setting of immunosuppression. May stop vanc in next 24 hours. Culture data monitor,note staph and gnr in blood and gnr in urine Increased respiratory rate on ventilator to 24 recheck arterial blood gas. Done and abg improved + bicarb drip per rernal Monitor lactate (2.8) Chest x-ray  Troponin  bump - presumed demand. Trend troponin Since INR is 7.4 no need for anticoagulation  Hx ESRD s/p renal transplant - followed at [facility]. Metabolic acidosis Lab Results  Component Value Date   CREATININE 2.01 (H) 02/27/2019    Continue preadmission immunosuppressive,tacrolimus, mycophenolate  and empiric Bactrim Check levels. - Check tacrolimus and mycophenolate levels. Stress steroids since he is on prednisone Last seen at [facility] 02/25/2019 Renal consult called 02/28/2019 Plan for temporary Vas-Cath once coagulopathic state is corrected. Will hold off 6/4 per renal request Bicarb drip per renal  Hx DVT - on coumadin. INR 7.4 6/4 supratherapeutic INR Hold heparin drip 2 units of fresh frozen plasma Vitamin K 10 subcu Monitor coagulopathic state    Hx HTN, HLD. Continue statin DC all antihypertensives in the setting of sepsis shock  Hx DM. CBG (last 3)  Recent Labs    02/28/19 0324 02/28/19 0359 02/28/19 0814  GLUCAP 56* 143* 129*    Sliding scale insulin protocol Hold oral agent  Diarrhea. GI stool panel  Possible right native renal neoplasm noted on CT. Consider outpatient MRI once stabilized  Small prostatic abnormality noted on CT - neoplasm not excluded. Monitor PSA Consider urology consult once stabilized and COVID-19 STATUS is confirmed  Best Practice:  Diet: NPO. Pain/Anxiety/Delirium protocol (if indicated): Sedation protocol in place VAP protocol (if indicated): N/A. DVT prophylaxis: SCD's /supra therapeutic INR GI prophylaxis: N/A. Glucose control: SSI. Mobility: Bedrest. Code Status: Full. Family Communication: 02/28/2011 wife [name] Zabriskie updated at length.  Permission granted for temporary Vas-Cath and arterial line. Disposition: ICU.  Labs   CBC: Recent Labs  Lab 02/27/19 1534 02/27/19 1632 02/28/19 0546 02/28/19 0740  WBC 3.8*  --  10.1  --   HGB 13.9 13.3 12.1* 12.6*  HCT 45.9 39.0 39.7 37.0*  MCV 88.8  --   91.1  --   PLT 119*  --  86*  --    Basic Metabolic Panel: Recent Labs  Lab 02/27/19 1534 02/27/19 1632 02/28/19 0546 02/28/19 0740  NA 138 136  --  139  K 4.2 3.5  --  5.0  CL 109  --   --   --   CO2 18*  --   --   --   GLUCOSE 147*  --   --   --   BUN 32*  --   --   --   CREATININE 2.01*  --   --   --   CALCIUM 9.4  --   --   --   MG  --   --  0.9*  --   PHOS  --   --  3.9  --    GFR: Estimated Creatinine Clearance: 37.5 mL/min (A) (by C-G formula based on SCr of 2.01 mg/dL (H)). Recent Labs  Lab 02/27/19 1534 02/27/19 1559 02/27/19 1830 02/28/19 0331 02/28/19 0546  PROCALCITON  --   --   --  71.92  --   WBC 3.8*  --   --   --  10.1  LATICACIDVEN  --  3.9* 4.0* 2.8*  --    Liver Function Tests: Recent Labs  Lab 02/27/19 1534  AST 20  ALT 15  ALKPHOS 41  BILITOT 2.0*  PROT 6.0*  ALBUMIN 3.3*   Recent Labs  Lab 02/27/19 1534  LIPASE 32   No results for input(s): AMMONIA in the last 168 hours. ABG    Component Value Date/Time   PHART 7.179 (LL) 02/28/2019 0740   PCO2ART 39.2 02/28/2019 0740   PO2ART 152.0 (H) 02/28/2019 0740   HCO3 14.6 (L) 02/28/2019 0740   TCO2 16 (L) 02/28/2019 0740   ACIDBASEDEF 13.0 (H) 02/28/2019 0740   O2SAT 99.0 02/28/2019 0740    Coagulation Profile: Recent Labs  Lab 02/28/19 0331  INR 7.4*   Cardiac Enzymes: Recent Labs  Lab 02/27/19 1534 02/27/19 1841 02/28/19 0331 02/28/19 0546  TROPONINI 0.19* 0.25* 0.24* 0.34*   HbA1C: No results found for: HGBA1C CBG: Recent Labs  Lab 02/28/19 0040 02/28/19 0324 02/28/19 0359 02/28/19 0814  GLUCAP 86 56* 143* 129*   App CCT 78 min  [name] Dezarae Mcclaran ACNP Maryanna Shape PCCM Pager [phone] till 1 pm If no answer page [phone] 02/28/2019, 9:14 AM

## 2019-02-28 NOTE — Procedures (Signed)
Procedure-Arterial Catheter Placement--aborted X 3  Preprocedure diagnosis: Hypotension with hypoxia Postprocedure diagnosis: Same Proceduralist: Lillyanne Bradburn, DO FCCP Anesthetic: Conscious sedation with propofol after recent intubation Sterile prep: With chlorhexidine and regional barrier precautions Estimated blood loss: 15 cc Complications: Unable to pass wire in right or left femoral artery, unable to access right radial.  Left arm status post AV fistula Consent: Given verbally before procedure-Intubation  Catheter placed under urgent conditions Ultrasound guidance yes Patient was a probe identified secondary to ongoing critical care, chart check and name bracelet.  The right femoral, left femoral and right radial areas was prepped and draped with chlorhexidine and allowed to dry. Sterile drapes were applied After sufficient drying a Seldinger type-Quincke needle was placed into the left femoral artery.  Arterial blood flow was noted however a wire could not be placed secondary to significant calcification.  Next the right femoral was prepped and draped with chlorhexidine and allowed to dry regional barrier precautions were used.  On ultrasound on both calcification could be seen but there appeared to be significant luminal diameter to support placement.  However after needle was placed in the right artery a wire could not be placed safely secondary to the extent of calcification and this procedure was aborted. The patient had an AV fistula in the left arm and this arm was not accessible. Final attempt in the right radial artery was made.  Although blood flow was able to be noted wire could not be placed and the procedure was aborted.  This procedure excludes critical care time.  Irene Pap, DO [place] [phone]

## 2019-02-28 NOTE — Procedures (Signed)
Arterial Catheter Insertion Procedure Note Ryan Rice [id number] 12/26/1950  Procedure: Insertion of Arterial Catheter  Indications: Blood pressure monitoring and Frequent blood sampling  Procedure Details Consent: Risks of procedure as well as the alternatives and risks of each were explained to the (patient/caregiver).  Consent for procedure obtained. From wife. Time Out: Verified patient identification, verified procedure, site/side was marked, verified correct patient position, special equipment/implants available, medications/allergies/relevent history reviewed, required imaging and test results available.  Performed  Maximum sterile technique was used including antiseptics, cap, gloves, gown, hand hygiene, mask and sheet. Skin prep: Chlorhexidine; local anesthetic administered 20 gauge catheter was inserted into left femoral artery using the Seldinger technique.  Evaluation Blood flow good; BP tracing good. Complications: No apparent complications.   Ryan Rice Minor ACNP Ryan Rice PCCM Pager [phone] till 3 pm If no answer page [phone] 02/28/2019, 12:01 PM

## 2019-02-28 NOTE — Progress Notes (Signed)
CRITICAL VALUE ALERT  Critical Value:  Mag 0.9      INR 5.5  Date & Time Notied:  02/28/19 10:35 AM  Provider Notified: [name] Minor  Orders Received/Actions taken: Awaiting further orders

## 2019-03-01 ENCOUNTER — Inpatient Hospital Stay (HOSPITAL_COMMUNITY): Payer: Medicare HMO

## 2019-03-01 LAB — MAGNESIUM: Magnesium: 1.9 mg/dL (ref 1.7–2.4)

## 2019-03-01 LAB — BASIC METABOLIC PANEL
Anion gap: 13 (ref 5–15)
BUN: 44 mg/dL — ABNORMAL HIGH (ref 8–23)
CO2: 15 mmol/L — ABNORMAL LOW (ref 22–32)
Calcium: 7.4 mg/dL — ABNORMAL LOW (ref 8.9–10.3)
Chloride: 112 mmol/L — ABNORMAL HIGH (ref 98–111)
Creatinine, Ser: 2.37 mg/dL — ABNORMAL HIGH (ref 0.61–1.24)
GFR calc Af Amer: 31 mL/min — ABNORMAL LOW (ref >60)
GFR calc non Af Amer: 27 mL/min — ABNORMAL LOW (ref >60)
Glucose, Bld: 185 mg/dL — ABNORMAL HIGH (ref 70–99)
Potassium: 4.3 mmol/L (ref 3.5–5.1)
Sodium: 140 mmol/L (ref 135–145)

## 2019-03-01 LAB — CBC WITH DIFFERENTIAL/PLATELET
Abs Immature Granulocytes: 2.76 10*3/uL — ABNORMAL HIGH (ref 0.00–0.07)
Basophils Absolute: 0 10*3/uL (ref 0.0–0.1)
Basophils Relative: 0 %
Eosinophils Absolute: 0 10*3/uL (ref 0.0–0.5)
Eosinophils Relative: 0 %
HCT: 33.9 % — ABNORMAL LOW (ref 39.0–52.0)
Hemoglobin: 10.8 g/dL — ABNORMAL LOW (ref 13.0–17.0)
Immature Granulocytes: 20 %
Lymphocytes Relative: 1 %
Lymphs Abs: 0.2 10*3/uL — ABNORMAL LOW (ref 0.7–4.0)
MCH: 27.6 pg (ref 26.0–34.0)
MCHC: 31.9 g/dL (ref 30.0–36.0)
MCV: 86.5 fL (ref 80.0–100.0)
Monocytes Absolute: 0.4 10*3/uL (ref 0.1–1.0)
Monocytes Relative: 3 %
Neutro Abs: 10.8 10*3/uL — ABNORMAL HIGH (ref 1.7–7.7)
Neutrophils Relative %: 76 %
Platelets: 78 10*3/uL — ABNORMAL LOW (ref 150–400)
RBC: 3.92 MIL/uL — ABNORMAL LOW (ref 4.22–5.81)
RDW: 16.3 % — ABNORMAL HIGH (ref 11.5–15.5)
WBC Morphology: INCREASED
WBC: 14.2 10*3/uL — ABNORMAL HIGH (ref 4.0–10.5)
nRBC: 0 % (ref 0.0–0.2)

## 2019-03-01 LAB — BPAM FFP
Blood Product Expiration Date: 202006072359
Blood Product Expiration Date: 202006072359
ISSUE DATE / TIME: 202006040911
ISSUE DATE / TIME: 202006040911
Unit Type and Rh: 600
Unit Type and Rh: 600

## 2019-03-01 LAB — PREPARE FRESH FROZEN PLASMA: Unit division: 0

## 2019-03-01 LAB — GLUCOSE, CAPILLARY
Glucose-Capillary: 171 mg/dL — ABNORMAL HIGH (ref 70–99)
Glucose-Capillary: 151 mg/dL — ABNORMAL HIGH (ref 70–99)
Glucose-Capillary: 176 mg/dL — ABNORMAL HIGH (ref 70–99)
Glucose-Capillary: 147 mg/dL — ABNORMAL HIGH (ref 70–99)
Glucose-Capillary: 128 mg/dL — ABNORMAL HIGH (ref 70–99)
Glucose-Capillary: 105 mg/dL — ABNORMAL HIGH (ref 70–99)

## 2019-03-01 LAB — LEGIONELLA PNEUMOPHILA SEROGP 1 UR AG: L. pneumophila Serogp 1 Ur Ag: NEGATIVE

## 2019-03-01 LAB — GLUCOSE 6 PHOSPHATE DEHYDROGENASE
G6PDH: 8.8 U/g{Hb} (ref 4.6–13.5)
Hemoglobin: 7.8 g/dL — ABNORMAL LOW (ref 13.0–17.7)

## 2019-03-01 LAB — PROTIME-INR
INR: 4.6 (ref 0.8–1.2)
Prothrombin Time: 42.8 seconds — ABNORMAL HIGH (ref 11.4–15.2)

## 2019-03-01 LAB — NOVEL CORONAVIRUS, NAA (HOSP ORDER, SEND-OUT TO REF LAB; TAT 18-24 HRS): SARS-CoV-2, NAA: NOT DETECTED

## 2019-03-01 LAB — PHOSPHORUS: Phosphorus: 4 mg/dL (ref 2.5–4.6)

## 2019-03-01 LAB — PROCALCITONIN: Procalcitonin: >150 ng/mL

## 2019-03-01 LAB — TRIGLYCERIDES: Triglycerides: 140 mg/dL (ref <150)

## 2019-03-01 MED ORDER — SODIUM CHLORIDE 0.9 % IV SOLN
500.0000 mg | Freq: Three times a day (TID) | INTRAVENOUS | Status: DC
  Administered 2019-03-01 – 2019-03-02 (×3): 500 mg via INTRAVENOUS
  Filled 2019-03-01 (×6): qty 500

## 2019-03-01 MED ORDER — SODIUM CHLORIDE 0.9% FLUSH
10.0000 mL | INTRAVENOUS | Status: DC | PRN
  Administered 2019-03-01: 20 mL
  Filled 2019-03-01: qty 40

## 2019-03-01 MED ORDER — CHLORHEXIDINE GLUCONATE CLOTH 2 % EX PADS
6.0000 | MEDICATED_PAD | Freq: Every day | CUTANEOUS | Status: DC
  Administered 2019-03-02 – 2019-03-07 (×5): 6 via TOPICAL

## 2019-03-01 MED ORDER — PANTOPRAZOLE SODIUM 40 MG IV SOLR
40.0000 mg | INTRAVENOUS | Status: DC
  Administered 2019-03-01: 40 mg via INTRAVENOUS
  Filled 2019-03-01: qty 40

## 2019-03-01 MED ORDER — CHLORHEXIDINE GLUCONATE CLOTH 2 % EX PADS
6.0000 | MEDICATED_PAD | Freq: Every day | CUTANEOUS | Status: DC
  Administered 2019-03-02 – 2019-03-05 (×3): 6 via TOPICAL

## 2019-03-01 MED ORDER — SODIUM CHLORIDE 0.9% FLUSH
10.0000 mL | Freq: Two times a day (BID) | INTRAVENOUS | Status: DC
  Administered 2019-03-01: 10 mL
  Administered 2019-03-02: 40 mL
  Administered 2019-03-02 – 2019-03-03 (×3): 10 mL
  Administered 2019-03-04: 30 mL
  Administered 2019-03-04 – 2019-03-07 (×3): 10 mL

## 2019-03-01 MED ORDER — PRO-STAT SUGAR FREE PO LIQD: 30.0000 mL | Freq: Two times a day (BID) | ORAL | Status: DC

## 2019-03-01 MED ORDER — VITAL AF 1.2 CAL PO LIQD
1000.0000 mL | ORAL | Status: DC
  Administered 2019-03-01: 1000 mL

## 2019-03-01 NOTE — Progress Notes (Signed)
ANTICOAGULATION CONSULT NOTE - Initial Consult  Pharmacy Consult for Heparin (warfarin on hold) Indication: History of DVT  Allergies  Allergen Reactions  . Iodinated Diagnostic Agents Hives  . Tape Hives    Patient Measurements: Height: 5\' 11"  (180.3 cm) Weight: 196 lb 13.9 oz (89.3 kg) IBW/kg (Calculated) : 75.3  Vital Signs: Temp: 99.1 F (37.3 C) (06/05 0800) Temp Source: Oral (06/05 0800) BP: 131/66 (06/05 0800) Pulse Rate: 95 (06/05 0800)  Labs: Recent Labs    02/27/19 1534  02/28/19 0331 02/28/19 0546 02/28/19 0740 02/28/19 0857 02/28/19 1155 02/28/19 1355 02/28/19 1800 03/01/19 0349  HGB 13.9   < >  --  12.1* 12.6*  --  16.7  --   --  10.8*  HCT 45.9   < >  --  39.7 37.0*  --  49.0  --   --  33.9*  PLT 119*  --   --  86*  --   --   --   --   --  78*  APTT  --   --   --   --   --  52*  --   --   --   --   LABPROT  --   --  61.5*  --   --  49.4*  --   --   --  42.8*  INR  --   --  7.4*  --   --  5.5*  --   --   --  4.6*  CREATININE 2.01*  --   --   --   --  1.92*  --   --  2.08* 2.37*  TROPONINI 0.19*   < > 0.24* 0.34*  --   --   --  0.37*  --   --    < > = values in this interval not displayed.    Estimated Creatinine Clearance: 31.8 mL/min (A) (by C-G formula based on SCr of 2.37 mg/dL (H)).   Medical History: Past Medical History:  Diagnosis Date  . Kidney transplant recipient    Assessment: Transfer from outside hospital with diarrhea/dyspnea. On warfarin PTA for history of DVT.  Admission INR high, given FFP vit K INR today 4.6  Goal of Therapy:  Heparin level 0.3-0.7 units/ml Monitor platelets by anticoagulation protocol: Yes   Plan:  -Hold warfarin -Daily INR -Start heparin when INR is <2  Levester Fresh, PharmD, BCPS, BCCCP Clinical Pharmacist [phone]  Please check AMION for all [place] numbers  03/01/2019 8:40 AM

## 2019-03-01 NOTE — Progress Notes (Signed)
Second COVID test came back negative, spoke with COVID command center and they stated that it was safe to take patient off precautions.

## 2019-03-01 NOTE — Progress Notes (Signed)
Subjective:  Stabilizing a [place] more overnight- pressors coming down but UOP has dropped off some and crt up  Objective Vital signs in last 24 hours: Vitals:   03/01/19 0645 03/01/19 0700 03/01/19 0730 03/01/19 0740  BP:  125/65 115/71 (!) 154/69  Pulse:   82 95  Resp: (!) 24 (!) 24 (!) 24 12  Temp:      TempSrc:      SpO2:   95% 95%  Weight:      Height:       Weight change: 5.384 kg  Intake/Output Summary (Last 24 hours) at 03/01/2019 0809 Last data filed at 03/01/2019 0700 Gross per 24 hour  Intake 4153.78 ml  Output 205 ml  Net 3948.78 ml    Assessment/Plan:68 year old BM with history of a kidney transplant and other medical issues now presenting with sepsis/ sepsis/bacteremia 1.Renal- known history of kidney transplant with crt of 1.44 just days ago.  Now with A on CRF in the setting of sepsis.  Due to excellent care from CCM has improved rapidly- more hemodynamically stable.  Making urine- no indications for dialysis at present- am slightly concerned that UOP has dropped off some but BUN and crt not worsening rapidly so even if gets worse before gets better may not develop true HD need  2. Sepsis  - bacteremia in an immunocompromised patient.  Broad spectrum antibiotics and supportive care.  Stop myfortic in the setting of active infection 3.  Immune suppression- home reg prograf 2/2- myfortic 360 BID and pred 10-  Is currently on prograf 2/2 and stress dose steroids  4. Metabolic acidosis  - iv bicarb for now- stable  5.  Hyperkalemia- better 6.  Anemia - does not appear to be an issue right now  7.  HTN/vol-  Getting more volume overloaded but F102 is still OK - no lasix yet- repleting volume with sepsis     Louis Meckel    Labs: Basic Metabolic Panel: Recent Labs  Lab 02/28/19 0546  02/28/19 0857 02/28/19 1155 02/28/19 1800 03/01/19 0349  NA  --    < > 138 140 137 140  K  --    < > 5.0 4.7 4.7 4.3  CL  --   --  115*  --  113* 112*  CO2  --   --  13*   --  14* 15*  GLUCOSE  --   --  156*  --  197* 185*  BUN  --   --  35*  --  38* 44*  CREATININE  --   --  1.92*  --  2.08* 2.37*  CALCIUM  --   --  7.7*  --  7.4* 7.4*  PHOS 3.9  --   --   --   --  4.0   < > = values in this interval not displayed.   Liver Function Tests: Recent Labs  Lab 02/27/19 1534  AST 20  ALT 15  ALKPHOS 41  BILITOT 2.0*  PROT 6.0*  ALBUMIN 3.3*   Recent Labs  Lab 02/27/19 1534  LIPASE 32   No results for input(s): AMMONIA in the last 168 hours. CBC: Recent Labs  Lab 02/27/19 1534  02/28/19 0546 02/28/19 0740 02/28/19 1155 03/01/19 0349  WBC 3.8*  --  10.1  --   --  14.2*  NEUTROABS  --   --   --   --   --  10.8*  HGB 13.9   < >  12.1* 12.6* 16.7 10.8*  HCT 45.9   < > 39.7 37.0* 49.0 33.9*  MCV 88.8  --  91.1  --   --  86.5  PLT 119*  --  86*  --   --  78*   < > = values in this interval not displayed.   Cardiac Enzymes: Recent Labs  Lab 02/27/19 1534 02/27/19 1841 02/28/19 0331 02/28/19 0546 02/28/19 1355  TROPONINI 0.19* 0.25* 0.24* 0.34* 0.37*   CBG: Recent Labs  Lab 02/28/19 1514 02/28/19 2058 03/01/19 0112 03/01/19 0427 03/01/19 0747  GLUCAP 159* 178* 171* 151* 176*    Iron Studies:  Recent Labs    02/28/19 0331  FERRITIN 344*   Studies/Results: Ct Abdomen Pelvis W Contrast  Result Date: 02/27/2019 CLINICAL DATA:  Abdominal pain and diarrhea EXAM: CT ABDOMEN AND PELVIS WITH CONTRAST TECHNIQUE: Multidetector CT imaging of the abdomen and pelvis was performed using the standard protocol following bolus administration of intravenous contrast. CONTRAST:  80 mL OMNIPAQUE IOHEXOL 300 MG/ML  SOLN COMPARISON:  None. FINDINGS: Lower chest: There is extensive airspace consolidation in the inferior lingula as well as throughout the left lower lobe. More patchy infiltrate is noted in the right middle lobe and posterior left base regions. There is extensive coronary artery calcification evident. Hepatobiliary: No focal liver lesions  are appreciable. The gallbladder is mildly distended. There is cholelithiasis. The gallbladder wall does not appear appreciably thickened by CT. There is no evident biliary duct dilatation. Pancreas: There is no pancreatic mass or inflammatory focus. Spleen: No splenic lesions are evident. Adrenals/Urinary Tract: Adrenals appear normal. The native kidneys show cortical thinning and increased renal sinus fat consistent with chronic atrophy. There are small cysts in each kidney. There is a noncystic mass with calcification along the lateral mid right native kidney measuring 1.3 x 1.1 cm. There are foci of arterial vascular calcification in each native kidney. There is no native kidney hydronephrosis. There is a nonobstructing calculus measuring 6 x 3 mm in the lower pole of the native right kidney. There is a transplant kidney in the right upper pelvis. This kidney shows normal attenuation and has normal cortical thickness. There is no evident mass or hydronephrosis associated with transplant kidney. No renal or ureteral calculus evident. Urinary bladder midline. Stomach/Bowel: There are multiple colonic diverticula without evident diverticulitis. There is no appreciable bowel wall or mesenteric thickening. No evident bowel obstruction. No free air or portal venous air is evident. Terminal ileum appears unremarkable. Vascular/Lymphatic: There is extensive aortic and iliac artery atherosclerosis. There is calcification throughout major pelvic arterial vessels. No aneurysm evident. No adenopathy is appreciable in the abdomen or pelvis. Reproductive: The prostate appears prominent. There is a decreased attenuation mass along the anterior aspect of the prostate on the left measuring 1.3 x 1.3 cm. Seminal vesicles appear unremarkable. Other: Appendix appears normal. No abscess or ascites is evident in the abdomen or pelvis. There is a small ventral hernia containing fat but no bowel. Musculoskeletal: Bones are somewhat  osteoporotic. There is extensive degenerative change in each hip joint as well as in the lumbar spine. No blastic or lytic bone lesions are evident. There is no intramuscular lesion appreciable. IMPRESSION: 1. Extensive pneumonia, most severe in the inferior lingula and left lower lobe with more patchy infiltrate in the right middle lobe and posterior segment right lower lobe. 2. There is a 1.3 x 1.3 cm focus of decreased attenuation along the leftward aspect of prostate anteriorly. A small prostatic neoplasm could  present in this manner. Infectious lesion also could present in this manner. This finding warrants urologic assessment and correlation with PSA. 3. Normal appearing transplant kidney in the right lower quadrant. Atrophic native kidneys. Note that there is a noncystic mass arising from the lateral native kidney on the right. A small neoplasm in this area cannot be excluded. Nonemergent MR of the native kidneys pre and post-contrast may well be advisable given this concern. 4. Colonic diverticulosis without diverticulitis. No bowel obstruction. No abscess in the abdomen or pelvis. Appendix appears normal. 5.  Cholelithiasis.  Gallbladder mildly distended. 6. Extensive aortic and pelvic arterial atherosclerosis. Multiple foci of coronary artery calcification also noted. 7.  Small ventral hernia containing only fat. Electronically Signed   By: Lowella Grip III M.D.   On: 02/27/2019 19:23   Dg Chest Port 1 View  Result Date: 02/28/2019 CLINICAL DATA:  Check central line placement EXAM: PORTABLE CHEST 1 VIEW COMPARISON:  02/27/2019 FINDINGS: Endotracheal tube, gastric catheter and left jugular central line are noted in place. The left jugular tip lies in the proximal superior vena cava. Extensive left lower lobe pneumonia is noted similar to that seen on prior plain film and CT. Minimal right basilar atelectasis is noted. IMPRESSION: Stable left basilar infiltrate with new mild right basilar  atelectasis. Tubes and lines as described. Electronically Signed   By: Inez Catalina M.D.   On: 02/28/2019 07:03   Dg Chest Port 1 View  Result Date: 02/27/2019 CLINICAL DATA:  Fever, shortness of breath. EXAM: PORTABLE CHEST 1 VIEW COMPARISON:  None. FINDINGS: Mild cardiomegaly is noted. No pneumothorax is noted. Right lung is clear. Atherosclerosis of thoracic aorta is noted. Large left lower lobe airspace opacity is noted most consistent with pneumonia. Bony thorax is unremarkable. IMPRESSION: Large left lower lobe airspace opacity consistent with pneumonia. Followup PA and lateral chest X-ray is recommended in 3-4 weeks following trial of antibiotic therapy to ensure resolution and exclude underlying malignancy. Aortic Atherosclerosis (ICD10-I70.0). Electronically Signed   By: Marijo Conception M.D.   On: 02/27/2019 16:58   Medications: Infusions: . sodium chloride 10 mL/hr at 03/01/19 0700  . azithromycin Stopped (03/01/19 0004)  . ceFEPime (MAXIPIME) IV Stopped (02/28/19 2247)  . fentaNYL infusion INTRAVENOUS 200 mcg/hr (03/01/19 0700)  . norepinephrine (LEVOPHED) Adult infusion 6 mcg/min (03/01/19 0700)  . propofol (DIPRIVAN) infusion Stopped (03/01/19 7591)  .  sodium bicarbonate  infusion 1000 mL 75 mL/hr at 03/01/19 0700  . [START ON 03/02/2019] thiamine injection    . thiamine injection Stopped (02/28/19 2205)    Scheduled Medications: . atorvastatin  10 mg Oral q1800  . chlorhexidine gluconate (MEDLINE KIT)  15 mL Mouth Rinse BID  . Chlorhexidine Gluconate Cloth  6 each Topical Q0600  . folic acid  1 mg Intravenous Daily  . hydrocortisone sod succinate (SOLU-CORTEF) inj  50 mg Intravenous Q6H  . insulin aspart  0-20 Units Subcutaneous Q4H  . mouth rinse  15 mL Mouth Rinse 10 times per day  . sulfamethoxazole-trimethoprim  1 tablet Oral Q M,W,F  . tacrolimus  2 mg Oral BID    have reviewed scheduled and prn medications.  Physical Exam:  Physical exam was not done personally due  to patient being in isolation for possible COVD infection out of concern for PPE supply and to limit further providers exposed.  I did see him thru the glass and have discussed specifics of exam with CCM provider -  Getting more volume overloaded   03/01/2019,8:09  AM  LOS: 2 days

## 2019-03-01 NOTE — Progress Notes (Addendum)
NAME:  Ryan Rice, MRN:  [id number], DOB:  06/19/1951, LOS: 2 ADMISSION DATE:  02/27/2019, CONSULTATION DATE:  02/27/19 REFERRING MD:  [facility]  CHIEF COMPLAINT:  Diarrhea with dyspnea  Brief History   Ryan Rice is a 68 y.o. male with hx renal transplant, presented to [facility] with diarrhea.  Found to be hypoxic due to multifocal PNA.  Transferred to [facility] ICU due to increasing O2 needs. Had transient hypotension. [facility]: Transfer from [facility] for diarrhea and dyspnea  History of present illness   Ryan Rice is a 68 y.o. male who has a PMH including but not limited to ESRD s/p renal transplant, DM, HTN, HLD, DVT on coumadin.  He presented to [facility] 6/3 with abdominal pain.  Had diarrhea that started Ryan night prior after he had cut his grass.  Did report mild SOB and mild non productive cough.  No fevers/chills/sweats.  In ED, he was found to be hypoxic with slightly increased WOB.  He was placed on NRB to maintain SpO2 in 90s.  CXR demonstrated LLL opacity.  CT A/P demonstrated extensive PNA bilaterally L > R.  Initial rapid COVID negative.  Due to increased O2 needs and concern for deterioration, he was transferred to [facility] for further evaluation and management.  [facility]: Post Deceased Donor transplant 2013/01/03 at [facility]. Taking his immunosuppressants. No leafy vegetable intake or change in diet. No contacts with diarrhea, no blood in stool.  Dry, non-productive cough. No hemoptysis  Past Medical History  ESRD s/p renal transplant, DM, HTN, HLD, DVT on coumadin. [facility] Events   6/3 > admit.  Consults:  Nephrology.  Procedures:  None.  Significant Diagnostic Tests:  CXR 6/3 > LLL opacity. CT A / P 6/3 > extensive PNA bilaterally L > R.  Small prostatic neoplasm is not excluded.  Possible neoplasm in right kidney, non emergent MRI recommended.  Colonic diverticulosis.,.  Small ventral hernia. US renal transplant 6/4 >  Micro Data:  Blood 6/3 > staph  gnr>> Sputum 6/3 >  Urine 6/2 >>gnr U. Strep 6/4 > neg U. Legionella 6/4 >  SARS CoV2 6/3 > neg. Full PCR resent>> GI stool panel 6/3 >   Antimicrobials:  Vanc 6/3 > 12/2018 Cefepime 6/3 >  Flagyl (chronic given hx immunosuppression) 6/3 >  6/3 Bactrim>> 02/27/2019 Zithromax>>   Interim history/subjective:  Currently off vasopressors For increased sedation for agitation Does not need CRRT or hemodialysis at this time. Continues to follow   Objective:  Blood pressure (!) 154/69, pulse 95, temperature 98.5 F (36.9 C), temperature source Oral, resp. rate 12, height 5\' 11"  (1.803 m), weight 89.3 kg, SpO2 95 %.    Vent Mode: PSV;CPAP FiO2 (%):  [40 %-60 %] 40 % Set Rate:  [24 bmp] 24 bmp Vt Set:  [600 mL] 600 mL PEEP:  [8 cmH20] 8 cmH20 Pressure Support:  [10 cmH20] 10 cmH20 Plateau Pressure:  [15 cmH20-21 cmH20] 21 cmH20   Intake/Output Summary (Last 24 hours) at 03/01/2019 2952 Last data filed at 03/01/2019 0700 Gross per 24 hour  Intake 4153.78 ml  Output 205 ml  Net 3948.78 ml   Filed Weights   02/28/19 0036 02/28/19 0352 03/01/19 0432  Weight: 84.8 kg 84.8 kg 89.3 kg    Examination: General: Well-nourished well-developed male who becomes agitated when not sedated HEENT: Endotracheal tube in place Neuro: Currently sedated with fentanyl drip and PRN Versed.  Did require propofol during Ryan night for agitation CV: s1s2 rrr, no m/r/g  PULM: Coarse rhonchi bilaterally decreased breath sounds at left base. SA:YTKZ, non-tender, bsx4 active  Extremities: warm/dry, 1+ edema  Skin: no rashes or lesions     Assessment & Plan:   Multifocal PNA with acute hypoxic respiratory failure. Vent dependent respiratory failure secondary to pneumonia requiring intubation on 02/28/2019 05.30 Shock Day 2 of antibiotics vancomycin has been discontinued on 02/28/2019, he remains on Zithromax cefepime and Bactrim Urine culture is negative, blood culture with staph, and gram-negative rods,  novel COVID-19 testing pending Wean FiO2 and PEEP as tolerated bicarb drip per rernal.  FiO2 is now down to 40% with PEEP of 8 Serial chest x-ray Currently vasopressor support is off Continue stress steroids Solu-Cortef 50 mg IV every 6 Continue to monitor culture  Troponin bump - presumed demand. Monitor troponin No anticoagulation needed till INR is less than 2  Hx ESRD s/p renal transplant - followed at [facility]. Metabolic acidosis Lab Results  Component Value Date   CREATININE 2.37 (H) 03/01/2019   CREATININE 2.08 (H) 02/28/2019   CREATININE 1.92 (H) 02/28/2019    Continue preadmission immunosuppressive,tacrolimus, mycophenolate  and empiric Bactrim Check levels. -Check tacrolimus and mycophenolate levels. Currently on Solu-Cortef 50 mg IV every 6 hours Last seen at [facility] 02/25/2019 Renal consult was obtained on 02/28/2019 no need for CRRT at this time Vas-Cath is on hold at this time Bicarb drip at 75 per renal  Hx DVT - on coumadin. INR 7.4 6/4 supratherapeutic INR Hold anticoagulation until INR is less than 2 2 units FFP and vitamin K given 02/28/2019 with INR on 03/01/2019 4.6 Continue to monitor    Hx HTN, HLD. Continue statin All antihypertensives are on hold due to shock  Hx DM. CBG (last 3)  Recent Labs    03/01/19 0112 03/01/19 0427 03/01/19 0747  GLUCAP 171* 151* 176*    Plan scale insulin protocol Oral agents are on hold  Diarrhea. GI stool panel pending  Possible right native renal neoplasm noted on CT. Questionable outpatient MRI once stabilized  Small prostatic abnormality noted on CT - neoplasm not excluded. Monitor PSA Consider urology consult once stabilized COVID-19 status is confirmed  Best Practice:  Diet: NPO. Pain/Anxiety/Delirium protocol (if indicated): Sedation protocol in place VAP protocol (if indicated): N/A. DVT prophylaxis: SCD's /supra therapeutic INR GI prophylaxis: N/A. Glucose control: SSI.  Mobility: Bedrest. Code Status: Full. Family Communication: 02/28/2011 wife Ryan [facility] updated at length.  Permission granted for temporary Vas-Cath and arterial line.  03/01/2011 note Vas-Cath was not placed deferred at this time. Renal following.  6/5 wife updated at length, questions answered.  Disposition: ICU.  Labs   CBC: Recent Labs  Lab 02/27/19 1534 02/27/19 1632 02/28/19 0546 02/28/19 0740 02/28/19 1155 03/01/19 0349  WBC 3.8*  --  10.1  --   --  14.2*  NEUTROABS  --   --   --   --   --  10.8*  HGB 13.9 13.3 12.1* 12.6* 16.7 10.8*  HCT 45.9 39.0 39.7 37.0* 49.0 33.9*  MCV 88.8  --  91.1  --   --  86.5  PLT 119*  --  86*  --   --  78*   Basic Metabolic Panel: Recent Labs  Lab 02/27/19 1534  02/28/19 0546 02/28/19 0740 02/28/19 0857 02/28/19 1155 02/28/19 1800 03/01/19 0349  NA 138   < >  --  139 138 140 137 140  K 4.2   < >  --  5.0 5.0 4.7 4.7  4.3  CL 109  --   --   --  115*  --  113* 112*  CO2 18*  --   --   --  13*  --  14* 15*  GLUCOSE 147*  --   --   --  156*  --  197* 185*  BUN 32*  --   --   --  35*  --  38* 44*  CREATININE 2.01*  --   --   --  1.92*  --  2.08* 2.37*  CALCIUM 9.4  --   --   --  7.7*  --  7.4* 7.4*  MG  --   --  0.9*  --  1.1*  --   --  1.9  PHOS  --   --  3.9  --   --   --   --  4.0   < > = values in this interval not displayed.   GFR: Estimated Creatinine Clearance: 31.8 mL/min (A) (by C-G formula based on SCr of 2.37 mg/dL (H)). Recent Labs  Lab 02/27/19 1534 02/27/19 1559 02/27/19 1830 02/28/19 0331 02/28/19 0546 03/01/19 0349  PROCALCITON  --   --   --  71.92 147.88 >150.00  WBC 3.8*  --   --   --  10.1 14.2*  LATICACIDVEN  --  3.9* 4.0* 2.8*  --   --    Liver Function Tests: Recent Labs  Lab 02/27/19 1534  AST 20  ALT 15  ALKPHOS 41  BILITOT 2.0*  PROT 6.0*  ALBUMIN 3.3*   Recent Labs  Lab 02/27/19 1534  LIPASE 32   No results for input(s): AMMONIA in Ryan last 168 hours. ABG    Component Value  Date/Time   PHART 7.303 (L) 02/28/2019 1155   PCO2ART 24.2 (L) 02/28/2019 1155   PO2ART 61.0 (L) 02/28/2019 1155   HCO3 12.0 (L) 02/28/2019 1155   TCO2 13 (L) 02/28/2019 1155   ACIDBASEDEF 12.0 (H) 02/28/2019 1155   O2SAT 89.0 02/28/2019 1155    Coagulation Profile: Recent Labs  Lab 02/28/19 0331 02/28/19 0857 03/01/19 0349  INR 7.4* 5.5* 4.6*   Cardiac Enzymes: Recent Labs  Lab 02/27/19 1534 02/27/19 1841 02/28/19 0331 02/28/19 0546 02/28/19 1355  TROPONINI 0.19* 0.25* 0.24* 0.34* 0.37*   HbA1C: No results found for: HGBA1C CBG: Recent Labs  Lab 02/28/19 1514 02/28/19 2058 03/01/19 0112 03/01/19 0427 03/01/19 0747  GLUCAP 159* 178* 171* 151* 176*   App CCT 45 min  [name] Minor ACNP [facility] Pager [phone] till 1 pm If no answer page [phone] 03/01/2019, 8:12 AM

## 2019-03-01 NOTE — Progress Notes (Signed)
CRITICAL VALUE ALERT  Critical Value:  INR 4.6  Date & Time Notied:  03/01/2019  Provider Notified: Dr. Jimmy Footman  Orders Received/Actions taken: No new orders received

## 2019-03-01 NOTE — Progress Notes (Signed)
Peep change per MD.

## 2019-03-01 NOTE — Progress Notes (Signed)
Secure chat with provider to add GI prophylaxis to pt's profile.

## 2019-03-01 NOTE — Progress Notes (Signed)
Nutrition Follow-up / Consult RD working remotely.  DOCUMENTATION CODES:   Not applicable  INTERVENTION:    Vital AF 1.2 at 69 ml/h (1680 ml per day)   Provides 2016 kcal, 126 gm protein, 1362 ml free water daily  NUTRITION DIAGNOSIS:   Inadequate oral intake related to inability to eat as evidenced by NPO status.  Ongoing  GOAL:   Patient will meet greater than or equal to 90% of their needs  Being addressed with TF initiation today  MONITOR:   Vent status, Labs, Skin, I & O's  REASON FOR ASSESSMENT:   Consult Enteral/tube feeding initiation and management  ASSESSMENT:   68 yo male with PMH of ESRD s/p renal transplant, DM, HTN, HLD, DVT who was admitted with diarrhea, multifocal PNA, sepsis. Required intubation early 6/4.  Received MD Consult for TF initiation and management. OGT in place.   COVID-19 negative.  Patient remains intubated on ventilator support MV: 9.7 L/min Temp (24hrs), Avg:98.3 F (36.8 C), Min:97.5 F (36.4 C), Max:99.1 F (37.3 C)   Labs reviewed. CBG's: [phone]  Medications reviewed and include folic acid, Solu-cortef, Novolog, Levophed, thiamine, sodium bicarb. Propofol d/c'ed.  I/O net +9.2 L  Per RN documentation, no edema present today.  Diet Order:   Diet Order            Diet NPO time specified  Diet effective now              EDUCATION NEEDS:   No education needs have been identified at this time  Skin:  Skin Assessment: Reviewed RN Assessment  Last BM:  6/3 (type 6)  Height:   Ht Readings from Last 1 Encounters:  02/28/19 5\' 11"  (1.803 m)    Weight:   Wt Readings from Last 1 Encounters:  03/01/19 89.3 kg   02/28/19  84.8 kg    Ideal Body Weight:  78.2 kg  BMI:  Body mass index is 27.46 kg/m.  Estimated Nutritional Needs:   Kcal:  2065  Protein:  125-145 gm  Fluid:  >/= 2.1 L    Molli Barrows, RD, LDN, [employer] Pager [phone] After Hours Pager [phone]

## 2019-03-02 ENCOUNTER — Inpatient Hospital Stay (HOSPITAL_COMMUNITY): Payer: Medicare HMO

## 2019-03-02 ENCOUNTER — Other Ambulatory Visit (HOSPITAL_COMMUNITY): Payer: Medicare HMO

## 2019-03-02 DIAGNOSIS — A419 Sepsis, unspecified organism: Principal | ICD-10-CM

## 2019-03-02 DIAGNOSIS — R6521 Severe sepsis with septic shock: Secondary | ICD-10-CM

## 2019-03-02 DIAGNOSIS — I361 Nonrheumatic tricuspid (valve) insufficiency: Secondary | ICD-10-CM

## 2019-03-02 LAB — CULTURE, BLOOD (ROUTINE X 2)
Special Requests: ADEQUATE
Special Requests: ADEQUATE

## 2019-03-02 LAB — INTERLEUKIN-6, PLASMA: Interleukin-6, Plasma: >3000 pg/mL — AB (ref 0.0–12.2)

## 2019-03-02 LAB — CBC WITH DIFFERENTIAL/PLATELET
Abs Immature Granulocytes: 0.79 10*3/uL — ABNORMAL HIGH (ref 0.00–0.07)
Basophils Absolute: 0 10*3/uL (ref 0.0–0.1)
Basophils Relative: 0 %
Eosinophils Absolute: 6.3 10*3/uL — ABNORMAL HIGH (ref 0.0–0.5)
Eosinophils Relative: 50 %
HCT: 31 % — ABNORMAL LOW (ref 39.0–52.0)
Hemoglobin: 10.1 g/dL — ABNORMAL LOW (ref 13.0–17.0)
Immature Granulocytes: 6 %
Lymphocytes Relative: 1 %
Lymphs Abs: 0.2 10*3/uL — ABNORMAL LOW (ref 0.7–4.0)
MCH: 27.4 pg (ref 26.0–34.0)
MCHC: 32.6 g/dL (ref 30.0–36.0)
MCV: 84 fL (ref 80.0–100.0)
Monocytes Absolute: 0.3 10*3/uL (ref 0.1–1.0)
Monocytes Relative: 2 %
Neutro Abs: 5.3 10*3/uL (ref 1.7–7.7)
Neutrophils Relative %: 41 %
Platelets: 70 10*3/uL — ABNORMAL LOW (ref 150–400)
RBC: 3.69 MIL/uL — ABNORMAL LOW (ref 4.22–5.81)
RDW: 15.9 % — ABNORMAL HIGH (ref 11.5–15.5)
WBC: 12.9 10*3/uL — ABNORMAL HIGH (ref 4.0–10.5)
nRBC: 0 % (ref 0.0–0.2)

## 2019-03-02 LAB — POCT I-STAT 7, (LYTES, BLD GAS, ICA,H+H)
Acid-base deficit: 3 mmol/L — ABNORMAL HIGH (ref 0.0–2.0)
Bicarbonate: 18.7 mmol/L — ABNORMAL LOW (ref 20.0–28.0)
Calcium, Ion: 1.07 mmol/L — ABNORMAL LOW (ref 1.15–1.40)
HCT: 29 % — ABNORMAL LOW (ref 39.0–52.0)
Hemoglobin: 9.9 g/dL — ABNORMAL LOW (ref 13.0–17.0)
O2 Saturation: 95 %
Patient temperature: 98.5
Potassium: 3.4 mmol/L — ABNORMAL LOW (ref 3.5–5.1)
Sodium: 141 mmol/L (ref 135–145)
TCO2: 19 mmol/L — ABNORMAL LOW (ref 22–32)
pCO2 arterial: 22.7 mmHg — ABNORMAL LOW (ref 32.0–48.0)
pH, Arterial: 7.524 — ABNORMAL HIGH (ref 7.350–7.450)
pO2, Arterial: 63 mmHg — ABNORMAL LOW (ref 83.0–108.0)

## 2019-03-02 LAB — PHOSPHORUS: Phosphorus: 2.2 mg/dL — ABNORMAL LOW (ref 2.5–4.6)

## 2019-03-02 LAB — ALBUMIN: Albumin: 2.1 g/dL — ABNORMAL LOW (ref 3.5–5.0)

## 2019-03-02 LAB — MAGNESIUM: Magnesium: 1.9 mg/dL (ref 1.7–2.4)

## 2019-03-02 LAB — GLUCOSE, CAPILLARY
Glucose-Capillary: 136 mg/dL — ABNORMAL HIGH (ref 70–99)
Glucose-Capillary: 168 mg/dL — ABNORMAL HIGH (ref 70–99)
Glucose-Capillary: 168 mg/dL — ABNORMAL HIGH (ref 70–99)
Glucose-Capillary: 172 mg/dL — ABNORMAL HIGH (ref 70–99)
Glucose-Capillary: 174 mg/dL — ABNORMAL HIGH (ref 70–99)
Glucose-Capillary: 174 mg/dL — ABNORMAL HIGH (ref 70–99)
Glucose-Capillary: 215 mg/dL — ABNORMAL HIGH (ref 70–99)

## 2019-03-02 LAB — BASIC METABOLIC PANEL
Anion gap: 12 (ref 5–15)
BUN: 53 mg/dL — ABNORMAL HIGH (ref 8–23)
CO2: 20 mmol/L — ABNORMAL LOW (ref 22–32)
Calcium: 7.5 mg/dL — ABNORMAL LOW (ref 8.9–10.3)
Chloride: 108 mmol/L (ref 98–111)
Creatinine, Ser: 2.41 mg/dL — ABNORMAL HIGH (ref 0.61–1.24)
GFR calc Af Amer: 31 mL/min — ABNORMAL LOW (ref >60)
GFR calc non Af Amer: 27 mL/min — ABNORMAL LOW (ref >60)
Glucose, Bld: 227 mg/dL — ABNORMAL HIGH (ref 70–99)
Potassium: 3.4 mmol/L — ABNORMAL LOW (ref 3.5–5.1)
Sodium: 140 mmol/L (ref 135–145)

## 2019-03-02 LAB — PROTIME-INR
INR: 3 — ABNORMAL HIGH (ref 0.8–1.2)
Prothrombin Time: 30.9 seconds — ABNORMAL HIGH (ref 11.4–15.2)

## 2019-03-02 LAB — TACROLIMUS LEVEL: Tacrolimus (FK506) - LabCorp: 9.2 ng/mL (ref 2.0–20.0)

## 2019-03-02 MED ORDER — FAMOTIDINE IN NACL 20-0.9 MG/50ML-% IV SOLN
20.0000 mg | INTRAVENOUS | Status: DC
  Administered 2019-03-02 – 2019-03-03 (×2): 20 mg via INTRAVENOUS
  Filled 2019-03-02 (×2): qty 50

## 2019-03-02 MED ORDER — SODIUM CHLORIDE 0.9 % IV SOLN
3.0000 g | Freq: Three times a day (TID) | INTRAVENOUS | Status: DC
  Administered 2019-03-02 – 2019-03-05 (×9): 3 g via INTRAVENOUS
  Filled 2019-03-02 (×14): qty 3

## 2019-03-02 MED ORDER — DEXMEDETOMIDINE HCL IN NACL 400 MCG/100ML IV SOLN
0.4000 ug/kg/h | INTRAVENOUS | Status: DC
  Administered 2019-03-02: 0.4 ug/kg/h via INTRAVENOUS
  Administered 2019-03-03: 06:00:00 0.2 ug/kg/h via INTRAVENOUS
  Filled 2019-03-02 (×2): qty 100

## 2019-03-02 MED ORDER — MAGNESIUM SULFATE 2 GM/50ML IV SOLN
2.0000 g | Freq: Once | INTRAVENOUS | Status: AC
  Administered 2019-03-02: 2 g via INTRAVENOUS
  Filled 2019-03-02: qty 50

## 2019-03-02 MED ORDER — POTASSIUM CHLORIDE 20 MEQ/15ML (10%) PO SOLN
40.0000 meq | Freq: Once | ORAL | Status: AC
  Administered 2019-03-02: 06:00:00 40 meq
  Filled 2019-03-02: qty 30

## 2019-03-02 MED ORDER — SODIUM PHOSPHATES 45 MMOLE/15ML IV SOLN
10.0000 mmol | Freq: Once | INTRAVENOUS | Status: AC
  Administered 2019-03-02: 10 mmol via INTRAVENOUS
  Filled 2019-03-02: qty 3.33

## 2019-03-02 MED ORDER — CHLORHEXIDINE GLUCONATE 0.12 % MT SOLN
15.0000 mL | Freq: Two times a day (BID) | OROMUCOSAL | Status: DC
  Administered 2019-03-02 – 2019-03-08 (×11): 15 mL via OROMUCOSAL
  Filled 2019-03-02 (×11): qty 15

## 2019-03-02 MED ORDER — ORAL CARE MOUTH RINSE
15.0000 mL | Freq: Two times a day (BID) | OROMUCOSAL | Status: DC
  Administered 2019-03-03 – 2019-03-08 (×9): 15 mL via OROMUCOSAL

## 2019-03-02 NOTE — Progress Notes (Signed)
RT note- Patient is tolerating wean well except for continuing to bite on the tube and needing to be reminded, good volumes. Just restless, follows commands, continue to monitor.

## 2019-03-02 NOTE — Progress Notes (Signed)
*  PRELIMINARY RESULTS* Echocardiogram 2D Echocardiogram has been performed.  Leavy Cella 03/02/2019, 5:41 PM

## 2019-03-02 NOTE — Progress Notes (Signed)
Subjective:  Stable overnight- pressors off- confirmatory test negative for COVID-  UOP still down, crt up slightly    Objective Vital signs in last 24 hours: Vitals:   03/02/19 0515 03/02/19 0530 03/02/19 0545 03/02/19 0600  BP:    107/65  Pulse: 78 74 73 75  Resp: (!) 24 (!) 24 (!) 24 (!) 24  Temp:      TempSrc:      SpO2: 100% 100% 100% 100%  Weight:      Height:       Weight change: 3.3 kg  Intake/Output Summary (Last 24 hours) at 03/02/2019 8756 Last data filed at 03/02/2019 0600 Gross per 24 hour  Intake 3933.09 ml  Output 369 ml  Net 3564.09 ml    Assessment/Plan:68 year old BM with history of a kidney transplant and other medical issues now presenting with sepsis/ sepsis/bacteremia 1.Renal- known history of kidney transplant with crt of 1.44 just days ago.  Now with A on CRF in the setting of sepsis.  Due to excellent care from CCM has improved rapidly- more hemodynamically stable.  Making urine but not much right now- no indications for dialysis at present- am slightly concerned that UOP has not picked up but BUN and crt not worsening rapidly so even if gets worse before gets better may not develop true HD need - has AVF 2. Sepsis  - bacteremia in an immunocompromised patient.  Broad spectrum antibiotics and supportive care.  Stop myfortic in the setting of active infection 3.  Immune suppression- home reg prograf 2/2- myfortic 360 BID and pred 10-  Is currently on prograf 2/2 and stress dose steroids  4. Metabolic acidosis  - iv bicarb for now- stable - will stop iv bicarb 5.  Hyperkalemia- better- getting some this AM per CCM 6.  Anemia - does not appear to be a major issue right now  7.  HTN/vol-  Getting more volume overloaded but F102 is still OK - no lasix yet- repleting volume with sepsis- will minimize as now appears 12 liters positive     Jamey Harman A Ama Mcmaster    Labs: Basic Metabolic Panel: Recent Labs  Lab 02/28/19 0546  02/28/19 1800 03/01/19 0349  03/02/19 0500 03/02/19 0508  NA  --    < > 137 140 140 141  K  --    < > 4.7 4.3 3.4* 3.4*  CL  --    < > 113* 112* 108  --   CO2  --    < > 14* 15* 20*  --   GLUCOSE  --    < > 197* 185* 227*  --   BUN  --    < > 38* 44* 53*  --   CREATININE  --    < > 2.08* 2.37* 2.41*  --   CALCIUM  --    < > 7.4* 7.4* 7.5*  --   PHOS 3.9  --   --  4.0 2.2*  --    < > = values in this interval not displayed.   Liver Function Tests: Recent Labs  Lab 02/27/19 1534  AST 20  ALT 15  ALKPHOS 41  BILITOT 2.0*  PROT 6.0*  ALBUMIN 3.3*   Recent Labs  Lab 02/27/19 1534  LIPASE 32   No results for input(s): AMMONIA in the last 168 hours. CBC: Recent Labs  Lab 02/27/19 1534  02/28/19 0546  03/01/19 0349 03/02/19 0500 03/02/19 0508  WBC 3.8*  --  10.1  --  14.2* 12.9*  --   NEUTROABS  --   --   --   --  10.8* 5.3  --   HGB 13.9   < > 12.1*   < > 10.8* 10.1* 9.9*  HCT 45.9   < > 39.7   < > 33.9* 31.0* 29.0*  MCV 88.8  --  91.1  --  86.5 84.0  --   PLT 119*  --  86*  --  78* 70*  --    < > = values in this interval not displayed.   Cardiac Enzymes: Recent Labs  Lab 02/27/19 1534 02/27/19 1841 02/28/19 0331 02/28/19 0546 02/28/19 1355  TROPONINI 0.19* 0.25* 0.24* 0.34* 0.37*   CBG: Recent Labs  Lab 03/01/19 1155 03/01/19 1552 03/01/19 2023 03/01/19 2357 03/02/19 0416  GLUCAP 147* 128* 105* 168* 168*    Iron Studies:  Recent Labs    02/28/19 0331  FERRITIN 344*   Studies/Results: US Renal Transplant W/doppler  Result Date: 03/01/2019 CLINICAL DATA:  Acute renal failure. EXAM: ULTRASOUND OF RENAL TRANSPLANT WITH RENAL DOPPLER ULTRASOUND TECHNIQUE: Ultrasound examination of the renal transplant was performed with gray-scale, color and duplex doppler evaluation. COMPARISON:  CT dated 02/27/2019. FINDINGS: Transplant kidney location: Right pelvis. Transplant Kidney: Renal measurements: 10 x 6.1 x 7.5 cm = volume: 261m. Normal in size and parenchymal echogenicity. No  evidence of mass or hydronephrosis. No peri-transplant fluid collection seen. Color flow in the main renal artery:  Yes Color flow in the main renal vein:  Yes Duplex Doppler Evaluation: Main Renal Artery Resistive Index: 0.76 Venous waveform in main renal vein:  Present Intrarenal resistive index in upper pole:  0.71 (normal 0.6-0.8; equivocal 0.8-0.9; abnormal >= 0.9) Intrarenal resistive index in lower pole: 0.69 (normal 0.6-0.8; equivocal 0.8-0.9; abnormal >= 0.9) Bladder: Decompressed with a Foley catheter. Other findings: There is a small amount of free fluid in the right lower quadrant which is new since prior CT. IMPRESSION: 1. Unremarkable appearance of the right lower quadrant transplant kidney without evidence of hydronephrosis. The resistive indices are within the normal range. 2. Increasing free fluid in the right lower quadrant when compared to CT dated 02/27/2019. Electronically Signed   By: CConstance HolsterM.D.   On: 03/01/2019 19:19   Dg Chest Port 1 View  Result Date: 03/01/2019 CLINICAL DATA:  [age]year old male with respiratory failure. COVID-19 status pending. EXAM: PORTABLE CHEST 1 VIEW COMPARISON:  02/28/2019 and earlier. FINDINGS: Portable AP semi upright view at 0515 hours. ETT tip between the level the clavicles and carina. Stable left IJ central line. Enteric tube courses to the abdomen, tip not included. Lower lung volumes. Continued confluent left mid and lower lung opacity, although lung base ventilation has improved since 02/27/2019. Pulmonary vascular congestion without overt edema. Mild streaky opacity at the right lung base. Stable cardiac size and mediastinal contours. Calcified aortic atherosclerosis. No pneumothorax. No large pleural effusion. IMPRESSION: 1.  Stable lines and tubes. 2. Large area of left lung pneumonia with mild improvement since 02/27/2019. 3. Patchy opacity at the right lung base, favor atelectasis. Electronically Signed   By: HGenevie AnnM.D.   On: 03/01/2019  08:21   Medications: Infusions: . sodium chloride 10 mL/hr at 03/02/19 0600  . feeding supplement (VITAL AF 1.2 CAL) 1,000 mL (03/01/19 1425)  . fentaNYL infusion INTRAVENOUS 100 mcg/hr (03/02/19 0600)  . imipenem-cilastatin Stopped (03/02/19 0539)  . propofol (DIPRIVAN) infusion 20 mcg/kg/min (03/02/19 0600)  .  sodium bicarbonate  infusion 1000 mL  75 mL/hr at 03/02/19 0600  . thiamine injection      Scheduled Medications: . atorvastatin  10 mg Oral q1800  . chlorhexidine gluconate (MEDLINE KIT)  15 mL Mouth Rinse BID  . Chlorhexidine Gluconate Cloth  6 each Topical Q0600  . Chlorhexidine Gluconate Cloth  6 each Topical Daily  . folic acid  1 mg Intravenous Daily  . hydrocortisone sod succinate (SOLU-CORTEF) inj  50 mg Intravenous Q6H  . insulin aspart  0-20 Units Subcutaneous Q4H  . mouth rinse  15 mL Mouth Rinse 10 times per day  . pantoprazole (PROTONIX) IV  40 mg Intravenous Q24H  . sodium chloride flush  10-40 mL Intracatheter Q12H  . sulfamethoxazole-trimethoprim  1 tablet Oral Q M,W,F  . tacrolimus  2 mg Oral BID    have reviewed scheduled and prn medications.  Physical Exam  Gen:       Sedated on vent- staring off HEENT:   PERRLA, EOMI Lungs:  Mostly clear  CV:  RRR Abd:  Soft, non tender  Ext:  Really no edema Large left  Aneurysmal AVF     03/02/2019,7:12 AM  LOS: 3 days

## 2019-03-02 NOTE — Progress Notes (Signed)
NAME:  Kylar Leonhardt, MRN:  [id number], DOB:  07-08-1951, LOS: 3 ADMISSION DATE:  02/27/2019, CONSULTATION DATE:  02/27/19 REFERRING MD:  [facility]  CHIEF COMPLAINT:  Diarrhea with dyspnea  Brief History   Yousif Edelson is a 68 y.o. male with hx renal transplant, presented to [facility] with diarrhea.  Found to be hypoxic due to multifocal PNA.  Transferred to [facility] ICU due to increasing O2 needs. Had transient hypotension. SJZ: Transfer from [facility] regional for diarrhea and dyspnea  History of present illness   Andrez Lieurance is a 68 y.o. male who has a PMH including but not limited to ESRD s/p renal transplant, DM, HTN, HLD, DVT on coumadin.  He presented to [facility] 6/3 with abdominal pain.  Had diarrhea that started the night prior after he had cut his grass.  Did report mild SOB and mild non productive cough.  No fevers/chills/sweats.  In ED, he was found to be hypoxic with slightly increased WOB.  He was placed on NRB to maintain SpO2 in 90s.  CXR demonstrated LLL opacity.  CT A/P demonstrated extensive PNA bilaterally L > R.  Initial rapid COVID negative.  Due to increased O2 needs and concern for deterioration, he was transferred to [facility] for further evaluation and management.  SJZ: Post Deceased Donor transplant Dec 23, 2012 at [facility] Taking his immunosuppressants. No leafy vegetable intake or change in diet. No contacts with diarrhea, no blood in stool.  Dry, non-productive cough. No hemoptysis  Past Medical History  ESRD s/p renal transplant, DM, HTN, HLD, DVT on coumadin. [facility]   6/3 > admit.  6/.5 - Currently off vasopressors For increased sedation for agitation Does not need CRRT or hemodialysis at this time. Continues to follow  Consults:  Nephrology.  Procedures:  None.  Significant Diagnostic Tests:  CXR 6/3 > LLL opacity. CT A / P 6/3 > extensive PNA bilaterally L > R.  Small prostatic neoplasm is not excluded.  Possible neoplasm in right kidney,  non emergent MRI recommended.  Colonic diverticulosis.,.  Small ventral hernia. US renal transplant 6/4 >  Micro Data:  Blood 6/3 > staph in BCID gnr>> acenitobacter - sensitive except ceftriaxone Sputum 6/3 >  Urine 6/3 - neg U. Strep 6/4 > neg U. Legionella 6/4 >  SARS CoV2 6/3 > neg. Full PCR resent>> GI stool panel 6/3 >  MRSA PCR - neg  Antimicrobials:  Vanc 6/3 > 12/2018 Cefepime 6/3 >  Flagyl (chronic given hx immunosuppression) 6/3 >  6/3 Bactrim>> 02/27/2019 Zithromax>>   Interim history/subjective:   6/6 - doing ssbt. Restless but follows commands. Meets exdtubation criteria but agitation driving up BP and HR.  PSA 65 n 02/28/2019  Objective:  Blood pressure (!) 165/74, pulse (!) 118, temperature 98.6 F (37 C), temperature source Oral, resp. rate 17, height 5\' 11"  (1.803 m), weight 92.6 kg, SpO2 100 %.    Vent Mode: PSV;CPAP FiO2 (%):  [40 %] 40 % Set Rate:  [24 bmp] 24 bmp Vt Set:  [600 mL] 600 mL PEEP:  [5 cmH20] 5 cmH20 Pressure Support:  [5 cmH20-10 cmH20] 5 cmH20 Plateau Pressure:  [17 cmH20-23 cmH20] 17 cmH20   Intake/Output Summary (Last 24 hours) at 03/02/2019 0931 Last data filed at 03/02/2019 0900 Gross per 24 hour  Intake 4028.28 ml  Output 384 ml  Net 3644.28 ml   Filed Weights   02/28/19 0352 03/01/19 0432 03/02/19 0400  Weight: 84.8 kg 89.3 kg 92.6 kg  General Appearance:  Looks criticall ill  Head:  Normocephalic, without obvious abnormality, atraumatic Eyes:  PERRL - yes, conjunctiva/corneas - muddy     Ears:  Normal external ear canals, both ears Nose:  G tube - no Throat:  ETT TUBE - yes , OG tube - yes o TF Neck:  Supple,  No enlargement/tenderness/nodules Lungs: Clear to auscultation bilaterally, Ventilator   Synchrony - yes. SBT + Heart:  S1 and S2 normal, no murmur, CVP - no.  Pressors - no Abdomen:  Soft, no masses, no organomegaly Genitalia / Rectal:  Not done Extremities:  Extremities- intact Skin:  ntact in exposed areas .  Sacral area - not examined Neurologic:  Sedation - none -> RASS - +2 to +3 . Moves all 4s - yes. CAM-ICU - ? Positive  . Orientation - follows some commands.       LABS    PULMONARY Recent Labs  Lab 02/27/19 1632 02/28/19 0740 02/28/19 1155 03/02/19 0508  PHART 7.406 7.179* 7.303* 7.524*  PCO2ART 19.4* 39.2 24.2* 22.7*  PO2ART 74.0* 152.0* 61.0* 63.0*  HCO3 12.0* 14.6* 12.0* 18.7*  TCO2 12* 16* 13* 19*  O2SAT 94.0 99.0 89.0 95.0    CBC Recent Labs  Lab 02/28/19 0546  03/01/19 0349 03/02/19 0500 03/02/19 0508  HGB 12.1*   < > 10.8* 10.1* 9.9*  HCT 39.7   < > 33.9* 31.0* 29.0*  WBC 10.1  --  14.2* 12.9*  --   PLT 86*  --  78* 70*  --    < > = values in this interval not displayed.    COAGULATION Recent Labs  Lab 02/28/19 0331 02/28/19 0857 03/01/19 0349 03/02/19 0500  INR 7.4* 5.5* 4.6* 3.0*    CARDIAC   Recent Labs  Lab 02/27/19 1534 02/27/19 1841 02/28/19 0331 02/28/19 0546 02/28/19 1355  TROPONINI 0.19* 0.25* 0.24* 0.34* 0.37*   No results for input(s): PROBNP in the last 168 hours.   CHEMISTRY Recent Labs  Lab 02/27/19 1534  02/28/19 0546  02/28/19 0857 02/28/19 1155 02/28/19 1800 03/01/19 0349 03/02/19 0500 03/02/19 0508  NA 138   < >  --    < > 138 140 137 140 140 141  K 4.2   < >  --    < > 5.0 4.7 4.7 4.3 3.4* 3.4*  CL 109  --   --   --  115*  --  113* 112* 108  --   CO2 18*  --   --   --  13*  --  14* 15* 20*  --   GLUCOSE 147*  --   --   --  156*  --  197* 185* 227*  --   BUN 32*  --   --   --  35*  --  38* 44* 53*  --   CREATININE 2.01*  --   --   --  1.92*  --  2.08* 2.37* 2.41*  --   CALCIUM 9.4  --   --   --  7.7*  --  7.4* 7.4* 7.5*  --   MG  --   --  0.9*  --  1.1*  --   --  1.9 1.9  --   PHOS  --   --  3.9  --   --   --   --  4.0 2.2*  --    < > = values in this interval not displayed.   Estimated Creatinine Clearance: 34.1 mL/min (  A) (by C-G formula based on SCr of 2.41 mg/dL (H)).   LIVER Recent Labs  Lab  02/27/19 1534 02/28/19 0331 02/28/19 0857 03/01/19 0349 03/02/19 0500  AST 20  --   --   --   --   ALT 15  --   --   --   --   ALKPHOS 41  --   --   --   --   BILITOT 2.0*  --   --   --   --   PROT 6.0*  --   --   --   --   ALBUMIN 3.3*  --   --   --   --   INR  --  7.4* 5.5* 4.6* 3.0*     INFECTIOUS Recent Labs  Lab 02/27/19 1559 02/27/19 1830 02/28/19 0331 02/28/19 0546 03/01/19 0349  LATICACIDVEN 3.9* 4.0* 2.8*  --   --   PROCALCITON  --   --  71.92 147.88 >150.00     ENDOCRINE CBG (last 3)  Recent Labs    03/01/19 2357 03/02/19 0416 03/02/19 0737  GLUCAP 168* 168* 215*         IMAGING x48h  - image(s) personally visualized  -   highlighted in bold US Renal Transplant W/doppler  Result Date: 03/01/2019 CLINICAL DATA:  Acute renal failure. EXAM: ULTRASOUND OF RENAL TRANSPLANT WITH RENAL DOPPLER ULTRASOUND TECHNIQUE: Ultrasound examination of the renal transplant was performed with gray-scale, color and duplex doppler evaluation. COMPARISON:  CT dated 02/27/2019. FINDINGS: Transplant kidney location: Right pelvis. Transplant Kidney: Renal measurements: 10 x 6.1 x 7.5 cm = volume: 213mL. Normal in size and parenchymal echogenicity. No evidence of mass or hydronephrosis. No peri-transplant fluid collection seen. Color flow in the main renal artery:  Yes Color flow in the main renal vein:  Yes Duplex Doppler Evaluation: Main Renal Artery Resistive Index: 0.76 Venous waveform in main renal vein:  Present Intrarenal resistive index in upper pole:  0.71 (normal 0.6-0.8; equivocal 0.8-0.9; abnormal >= 0.9) Intrarenal resistive index in lower pole: 0.69 (normal 0.6-0.8; equivocal 0.8-0.9; abnormal >= 0.9) Bladder: Decompressed with a Foley catheter. Other findings: There is a small amount of free fluid in the right lower quadrant which is new since prior CT. IMPRESSION: 1. Unremarkable appearance of the right lower quadrant transplant kidney without evidence of hydronephrosis.  The resistive indices are within the normal range. 2. Increasing free fluid in the right lower quadrant when compared to CT dated 02/27/2019. Electronically Signed   By: Constance Holster M.D.   On: 03/01/2019 19:19   Dg Chest Port 1 View  Result Date: 03/02/2019 CLINICAL DATA:  Respiratory failure EXAM: PORTABLE CHEST 1 VIEW COMPARISON:  Chest radiograph 03/01/2019 FINDINGS: ET tube terminates in the mid trachea. Monitoring leads overlie the patient. Enteric tube courses inferior to the diaphragm. Low lung volumes. Cardiomegaly. Aortic atherosclerosis. Similar-appearing left greater than right airspace opacities. Small bilateral pleural effusions. No pneumothorax. IMPRESSION: Stable support apparatus. Re demonstrated consolidation within the left mid and lower lung and right lung base. Small bilateral pleural effusions. Electronically Signed   By: Lovey Newcomer M.D.   On: 03/02/2019 07:38   Dg Chest Port 1 View  Result Date: 03/01/2019 CLINICAL DATA:  68 year old male with respiratory failure. COVID-19 status pending. EXAM: PORTABLE CHEST 1 VIEW COMPARISON:  02/28/2019 and earlier. FINDINGS: Portable AP semi upright view at 0515 hours. ETT tip between the level the clavicles and carina. Stable left IJ central line. Enteric tube courses to  the abdomen, tip not included. Lower lung volumes. Continued confluent left mid and lower lung opacity, although lung base ventilation has improved since 02/27/2019. Pulmonary vascular congestion without overt edema. Mild streaky opacity at the right lung base. Stable cardiac size and mediastinal contours. Calcified aortic atherosclerosis. No pneumothorax. No large pleural effusion. IMPRESSION: 1.  Stable lines and tubes. 2. Large area of left lung pneumonia with mild improvement since 02/27/2019. 3. Patchy opacity at the right lung base, favor atelectasis. Electronically Signed   By: Genevie Ann M.D.   On: 03/01/2019 08:21      Assessment & Plan:   ASSESSMENT /  PLAN:  PULMONARY A:  Vent dependent respiratory failure secondary to pneumonia requiring intubation on 02/28/2019 05.30   03/02/2019 -> 03/02/2019 - > does yes meet criteria for SBT/Extubation in setting of Acute Respiratory Failure but agitation an issues   P:   Start precedex and extubate   NEUROLOGIC A:   Agitated but ? Delirium v discomfort of ET tube v both P:   Start precedex Dc diprivan    VASCULAR A:   Hx DVT - on coumadin.- INR 7.4 6/4 supratherapeutic INR Septic shock this admit  6/5 - 2 units FFP and vitamin K given 02/28/2019 with INR on 03/01/2019 4.6 03/02/2019 -off vasopressors  P:  Map goal > 65 No anticoagulation till INR < 2   CARDIAC A: Hx of HTN and HLD Trop bump - reflective of type 2 NSTEMI  P: Continue statin Check echo Track trop   INFECTIOUS A:   Baseline: immune supprssed  Multifocal PNA with acute hypoxic respiratory failure. acenitobacter in blood - septicemia Diarrhea - ? Due to above but stool PCR pending   P:   ? Change imipenem to cefepime Await stool PCR  RENAL A:  Hx ESRD s/p renal transplant - followed at [facility] last seen 02/25/2019 P:  Continue preadmission immunosuppressive,tacrolimus, mycophenolate  and empiric Bactrim Renal following - no CRRT needed  ELECTROLYTES A:  Mild low phos and mag P: replete   GASTROINTESTINAL A:   diarrrhea  P:   Await stool pcr Change ppi to h2 blockade  HEMATOLOGIC A:  Anemia Thrombocytopenia   P:  - PRBC for hgb </= 6.9gm%    - exceptions are   -  if ACS susepcted/confirmed then transfuse for hgb </= 8.0gm%,  or    -  active bleeding with hemodynamic instability, then transfuse regardless of hemoglobin value   At at all times try to transfuse 1 unit prbc as possible with exception of active hemorrhage     ENDOCRINE A:   At risk RAI   P:   Steroids with immune suppression  MSK/DERM Not examined  ONCOLOGY  Possible right native renal neoplasm noted on  CT. Small prostatic abnormality noted on CT - neoplasm not excluded PSA > 60 on 02/28/2019  Plan  - urology consult at some point     Best Practice:  Diet: DC TF in view of anticiopated extubation  Pain/Anxiety/Delirium protocol (if indicated): Sedation protocol in place VAP protocol (if indicated): N/A. DVT prophylaxis: SCD's /supra therapeutic INR GI prophylaxis: N/A. Glucose control: SSI. Mobility: Bedrest. Code Status: Full. Family Communication: 02/28/2011 wife [name] Kunkler updated at length.  Permission granted for temporary Vas-Cath and arterial line.  03/01/2011 note Vas-Cath was not placed deferred at this time. Renal following.  6/5 wife updated at length, questions answered.    Disposition: ICU.      ATTESTATION & SIGNATURE   The  patient Morio Widen is critically ill with multiple organ systems failure and requires high complexity decision making for assessment and support, frequent evaluation and titration of therapies, application of advanced monitoring technologies and extensive interpretation of multiple databases.   Critical Care Time devoted to patient care services described in this note is  30  Minutes. This time reflects time of care of this signee Dr Brand Males. This critical care time does not reflect procedure time, or teaching time or supervisory time of PA/NP/Med student/Med Resident etc but could involve care discussion time     Dr. Brand Males, M.D., [facility].C.P Pulmonary and Critical Care Medicine Staff Physician [place] [facility] Pager: [phone], If no answer or between  15:00h - 7:00h: call [phone]  03/02/2019 9:35 AM

## 2019-03-02 NOTE — Progress Notes (Signed)
ANTICOAGULATION CONSULT NOTE - Initial Consult  Pharmacy Consult for Heparin (warfarin on hold) Indication: History of DVT  Allergies  Allergen Reactions  . Iodinated Diagnostic Agents Hives  . Tape Hives    Patient Measurements: Height: 5\' 11"  (180.3 cm) Weight: 204 lb 2.3 oz (92.6 kg) IBW/kg (Calculated) : 75.3  Vital Signs: Temp: 98.6 F (37 C) (06/06 0756) Temp Source: Oral (06/06 0756) BP: 132/73 (06/06 1000) Pulse Rate: 101 (06/06 1000)  Labs: Recent Labs    02/28/19 0331 02/28/19 0546  02/28/19 0857  02/28/19 1355 02/28/19 1800 03/01/19 0349 03/02/19 0500 03/02/19 0508  HGB 7.8* 12.1*   < >  --    < >  --   --  10.8* 10.1* 9.9*  HCT  --  39.7   < >  --    < >  --   --  33.9* 31.0* 29.0*  PLT  --  86*  --   --   --   --   --  78* 70*  --   APTT  --   --   --  52*  --   --   --   --   --   --   LABPROT 61.5*  --   --  49.4*  --   --   --  42.8* 30.9*  --   INR 7.4*  --   --  5.5*  --   --   --  4.6* 3.0*  --   CREATININE  --   --   --  1.92*  --   --  2.08* 2.37* 2.41*  --   TROPONINI 0.24* 0.34*  --   --   --  0.37*  --   --   --   --    < > = values in this interval not displayed.    Estimated Creatinine Clearance: 34.1 mL/min (A) (by C-G formula based on SCr of 2.41 mg/dL (H)).   Medical History: Past Medical History:  Diagnosis Date  . Kidney transplant recipient    Assessment: Transfer from outside hospital with diarrhea/dyspnea. On warfarin PTA for history of DVT.  Admission INR high, given FFP vit K INR today 3.0  Goal of Therapy:  Heparin level 0.3-0.7 units/ml Monitor platelets by anticoagulation protocol: Yes   Plan:  -Hold warfarin -Daily INR -Start heparin when INR is <2  Harrietta Guardian, PharmD PGY1 Pharmacy Resident 03/02/2019    10:38 AM Please check AMION for all [place] numbers

## 2019-03-02 NOTE — Progress Notes (Signed)
SLP Cancellation Note  Patient Details Name: Ryan Rice MRN: [id number] DOB: [birth year]   Cancelled treatment:       Reason Eval/Treat Not Completed: Medical issues which prohibited therapy; pt remains intubated; will continue efforts.   Elvina Sidle, M.S., CCC-SLP 03/02/2019, 4:12 PM

## 2019-03-03 LAB — GLUCOSE, CAPILLARY
Glucose-Capillary: 112 mg/dL — ABNORMAL HIGH (ref 70–99)
Glucose-Capillary: 121 mg/dL — ABNORMAL HIGH (ref 70–99)
Glucose-Capillary: 134 mg/dL — ABNORMAL HIGH (ref 70–99)
Glucose-Capillary: 171 mg/dL — ABNORMAL HIGH (ref 70–99)
Glucose-Capillary: 184 mg/dL — ABNORMAL HIGH (ref 70–99)
Glucose-Capillary: 215 mg/dL — ABNORMAL HIGH (ref 70–99)

## 2019-03-03 LAB — CBC WITH DIFFERENTIAL/PLATELET
Abs Immature Granulocytes: 0.19 10*3/uL — ABNORMAL HIGH (ref 0.00–0.07)
Basophils Absolute: 0 10*3/uL (ref 0.0–0.1)
Basophils Relative: 0 %
Eosinophils Absolute: 0 10*3/uL (ref 0.0–0.5)
Eosinophils Relative: 0 %
HCT: 33.4 % — ABNORMAL LOW (ref 39.0–52.0)
Hemoglobin: 10.6 g/dL — ABNORMAL LOW (ref 13.0–17.0)
Immature Granulocytes: 2 %
Lymphocytes Relative: 2 %
Lymphs Abs: 0.2 10*3/uL — ABNORMAL LOW (ref 0.7–4.0)
MCH: 27.2 pg (ref 26.0–34.0)
MCHC: 31.7 g/dL (ref 30.0–36.0)
MCV: 85.6 fL (ref 80.0–100.0)
Monocytes Absolute: 0.7 10*3/uL (ref 0.1–1.0)
Monocytes Relative: 5 %
Neutro Abs: 11.7 10*3/uL — ABNORMAL HIGH (ref 1.7–7.7)
Neutrophils Relative %: 91 %
Platelets: 67 10*3/uL — ABNORMAL LOW (ref 150–400)
RBC: 3.9 MIL/uL — ABNORMAL LOW (ref 4.22–5.81)
RDW: 16.3 % — ABNORMAL HIGH (ref 11.5–15.5)
WBC: 12.8 10*3/uL — ABNORMAL HIGH (ref 4.0–10.5)
nRBC: 0.2 % (ref 0.0–0.2)

## 2019-03-03 LAB — RENAL FUNCTION PANEL
Albumin: 2.2 g/dL — ABNORMAL LOW (ref 3.5–5.0)
Anion gap: 10 (ref 5–15)
BUN: 60 mg/dL — ABNORMAL HIGH (ref 8–23)
CO2: 22 mmol/L (ref 22–32)
Calcium: 8.1 mg/dL — ABNORMAL LOW (ref 8.9–10.3)
Chloride: 110 mmol/L (ref 98–111)
Creatinine, Ser: 1.93 mg/dL — ABNORMAL HIGH (ref 0.61–1.24)
GFR calc Af Amer: 40 mL/min — ABNORMAL LOW (ref >60)
GFR calc non Af Amer: 35 mL/min — ABNORMAL LOW (ref >60)
Glucose, Bld: 141 mg/dL — ABNORMAL HIGH (ref 70–99)
Phosphorus: 3.5 mg/dL (ref 2.5–4.6)
Potassium: 3.7 mmol/L (ref 3.5–5.1)
Sodium: 142 mmol/L (ref 135–145)

## 2019-03-03 LAB — MAGNESIUM: Magnesium: 2.4 mg/dL (ref 1.7–2.4)

## 2019-03-03 LAB — ECHOCARDIOGRAM COMPLETE
Height: 71 in
Weight: 3266.34 oz

## 2019-03-03 LAB — LACTIC ACID, PLASMA: Lactic Acid, Venous: 1.4 mmol/L (ref 0.5–1.9)

## 2019-03-03 LAB — PROTIME-INR
INR: 1.9 — ABNORMAL HIGH (ref 0.8–1.2)
Prothrombin Time: 21.2 seconds — ABNORMAL HIGH (ref 11.4–15.2)

## 2019-03-03 LAB — BASIC METABOLIC PANEL
Anion gap: 9 (ref 5–15)
BUN: 58 mg/dL — ABNORMAL HIGH (ref 8–23)
CO2: 22 mmol/L (ref 22–32)
Calcium: 7.9 mg/dL — ABNORMAL LOW (ref 8.9–10.3)
Chloride: 111 mmol/L (ref 98–111)
Creatinine, Ser: 2.03 mg/dL — ABNORMAL HIGH (ref 0.61–1.24)
GFR calc Af Amer: 38 mL/min — ABNORMAL LOW (ref >60)
GFR calc non Af Amer: 33 mL/min — ABNORMAL LOW (ref >60)
Glucose, Bld: 151 mg/dL — ABNORMAL HIGH (ref 70–99)
Potassium: 3.7 mmol/L (ref 3.5–5.1)
Sodium: 142 mmol/L (ref 135–145)

## 2019-03-03 LAB — TROPONIN I: Troponin I: 0.23 ng/mL (ref <0.03)

## 2019-03-03 MED ORDER — HEPARIN (PORCINE) 25000 UT/250ML-% IV SOLN
1300.0000 [IU]/h | INTRAVENOUS | Status: DC
  Administered 2019-03-03: 1300 [IU]/h via INTRAVENOUS
  Filled 2019-03-03: qty 250

## 2019-03-03 MED ORDER — WARFARIN - PHARMACIST DOSING INPATIENT
Freq: Every day | Status: DC
  Administered 2019-03-03 – 2019-03-04 (×2): 1
  Administered 2019-03-05 – 2019-03-07 (×3)

## 2019-03-03 MED ORDER — HYDROCORTISONE NA SUCCINATE PF 100 MG IJ SOLR
50.0000 mg | Freq: Two times a day (BID) | INTRAMUSCULAR | Status: DC
  Administered 2019-03-03 – 2019-03-05 (×5): 50 mg via INTRAVENOUS
  Filled 2019-03-03 (×6): qty 2

## 2019-03-03 MED ORDER — TAMSULOSIN HCL 0.4 MG PO CAPS
0.4000 mg | ORAL_CAPSULE | Freq: Every day | ORAL | Status: DC
  Administered 2019-03-03 – 2019-03-08 (×6): 0.4 mg via ORAL
  Filled 2019-03-03 (×6): qty 1

## 2019-03-03 MED ORDER — VITAMIN B-1 100 MG PO TABS
250.0000 mg | ORAL_TABLET | Freq: Every day | ORAL | Status: DC
  Administered 2019-03-04 – 2019-03-08 (×5): 250 mg via ORAL
  Filled 2019-03-03: qty 3
  Filled 2019-03-03: qty 1
  Filled 2019-03-03 (×3): qty 3

## 2019-03-03 MED ORDER — LABETALOL HCL 100 MG PO TABS
100.0000 mg | ORAL_TABLET | Freq: Two times a day (BID) | ORAL | Status: DC
  Administered 2019-03-03 – 2019-03-08 (×10): 100 mg via ORAL
  Filled 2019-03-03 (×11): qty 1

## 2019-03-03 MED ORDER — FAMOTIDINE 20 MG PO TABS
20.0000 mg | ORAL_TABLET | Freq: Every day | ORAL | Status: DC
  Administered 2019-03-04 – 2019-03-08 (×5): 20 mg via ORAL
  Filled 2019-03-03 (×5): qty 1

## 2019-03-03 MED ORDER — WARFARIN SODIUM 5 MG PO TABS
5.0000 mg | ORAL_TABLET | Freq: Once | ORAL | Status: AC
  Administered 2019-03-03: 5 mg via ORAL
  Filled 2019-03-03: qty 1

## 2019-03-03 MED ORDER — FOLIC ACID 1 MG PO TABS
1.0000 mg | ORAL_TABLET | Freq: Every day | ORAL | Status: DC
  Administered 2019-03-03 – 2019-03-08 (×6): 1 mg via ORAL
  Filled 2019-03-03 (×6): qty 1

## 2019-03-03 NOTE — Progress Notes (Addendum)
NAME:  Ryan Rice, MRN:  [id number], DOB:  06-15-1951, LOS: 4 ADMISSION DATE:  02/27/2019, CONSULTATION DATE:  02/27/19 REFERRING MD:  [facility]  CHIEF COMPLAINT:  Diarrhea with dyspnea  Brief History   Ryan Rice is a 68 y.o. male with hx renal transplant, presented to [facility] with diarrhea.  Found to be hypoxic due to multifocal PNA.  Transferred to [facility] ICU due to increasing O2 needs. Had transient hypotension. SJZ: Transfer from [facility] regional for diarrhea and dyspnea  History of present illness   Ryan Rice is a 68 y.o. male who has a PMH including but not limited to ESRD s/p renal transplant, DM, HTN, HLD, DVT on coumadin.  He presented to [facility] 6/3 with abdominal pain.  Had diarrhea that started the night prior after he had cut his grass.  Did report mild SOB and mild non productive cough.  No fevers/chills/sweats.  In ED, he was found to be hypoxic with slightly increased WOB.  He was placed on NRB to maintain SpO2 in 90s.  CXR demonstrated LLL opacity.  CT A/P demonstrated extensive PNA bilaterally L > R.  Initial rapid COVID negative.  Due to increased O2 needs and concern for deterioration, he was transferred to [facility] for further evaluation and management.  SJZ: Post Deceased Donor transplant 12/19/2012 at [facility]. Taking his immunosuppressants. No leafy vegetable intake or change in diet. No contacts with diarrhea, no blood in stool.  Dry, non-productive cough. No hemoptysis  Past Medical History  ESRD s/p renal transplant, DM, HTN, HLD, DVT on coumadin. [facility]   6/3 > admit.  6/.5 - Currently off vasopressors For increased sedation for agitation Does not need CRRT or hemodialysis at this time. Continues to follow  6/6 - doing ssbt. Restless but follows commands. Meets exdtubation criteria but agitation driving up BP and HR.  PSA 64 n 02/28/2019  Consults:  Nephrology.  Procedures:  None.  Significant Diagnostic Tests:  CXR 6/3 > LLL  opacity.  CT A / P 6/3 > extensive PNA bilaterally L > R.  Small prostatic neoplasm is not excluded.  Possible neoplasm in right kidney, non emergent MRI recommended.  Colonic diverticulosis.,.  Small ventral hernia. US renal transplant 6/4 >  Micro Data:  Blood 6/3 > staph in BCID gnr>> acenitobacter - sensitive except ceftriaxone Sputum 6/3 >  Urine 6/3 - neg U. Strep 6/4 > neg U. Legionella 6/4 >  SARS CoV2 6/3 > neg. Full PCR resent>>neg GI stool panel 6/3 >  Not sent - no stool made MRSA PCR - neg  Antimicrobials:  Vanc 6/3 > 12/2018 Cefepime 6/3 > 6/6 Flagyl (chronic given hx immunosuppression) 6/3 >  6/3 Bactrim Prophylaxi>> 02/27/2019 Zithromax>> 6/6 .... 6/6 Unasayn (pna and acenitobacter in blood) >>   Interim history/subjective:   6/7 - remains extubated. Needed precdex overnight but not now. Sitting in chair. Calm. Pleasant. Conversant. Cleared for diet. Mildy confused  Objective:  Blood pressure (!) 165/95, pulse 77, temperature 97.8 F (36.6 C), temperature source Oral, resp. rate 14, height 5\' 11"  (1.803 m), weight 91 kg, SpO2 96 %.        Intake/Output Summary (Last 24 hours) at 03/03/2019 1200 Last data filed at 03/03/2019 0800 Gross per 24 hour  Intake 858.42 ml  Output 780 ml  Net 78.42 ml   Filed Weights   03/01/19 0432 03/02/19 0400 03/03/19 0400  Weight: 89.3 kg 92.6 kg 91 kg     General Appearance:  Looks  better. Sitting in chair. Talking. OT working with him Head:  Normocephalic, without obvious abnormality, atraumatic Eyes:  PERRL - yes, conjunctiva/corneas - muddy     Ears:  Normal external ear canals, both ears Nose:  G tube - no Throat:  ETT TUBE - no , OG tube - no Neck:  Supple,  No enlargement/tenderness/nodules Lungs: Clear to auscultation bilaterally, Heart:  S1 and S2 normal, no murmur, CVP - no.  Pressors - no Abdomen:  Soft, no masses, no organomegaly Genitalia / Rectal:  Not done Extremities:  Extremities- intact Skin:  ntact  in exposed areas . Sacral area - not examined Neurologic:  Sedation - none -> RASS - +1 . Moves all 4s - yes. CAM-ICU - neg . Orientation - partial        LABS    PULMONARY Recent Labs  Lab 02/27/19 1632 02/28/19 0740 02/28/19 1155 03/02/19 0508  PHART 7.406 7.179* 7.303* 7.524*  PCO2ART 19.4* 39.2 24.2* 22.7*  PO2ART 74.0* 152.0* 61.0* 63.0*  HCO3 12.0* 14.6* 12.0* 18.7*  TCO2 12* 16* 13* 19*  O2SAT 94.0 99.0 89.0 95.0    CBC Recent Labs  Lab 03/01/19 0349 03/02/19 0500 03/02/19 0508 03/03/19 0328  HGB 10.8* 10.1* 9.9* 10.6*  HCT 33.9* 31.0* 29.0* 33.4*  WBC 14.2* 12.9*  --  12.8*  PLT 78* 70*  --  67*    COAGULATION Recent Labs  Lab 02/28/19 0331 02/28/19 0857 03/01/19 0349 03/02/19 0500 03/03/19 0328  INR 7.4* 5.5* 4.6* 3.0* 1.9*    CARDIAC   Recent Labs  Lab 02/27/19 1841 02/28/19 0331 02/28/19 0546 02/28/19 1355 03/03/19 0328  TROPONINI 0.25* 0.24* 0.34* 0.37* 0.23*   No results for input(s): PROBNP in the last 168 hours.   CHEMISTRY Recent Labs  Lab 02/28/19 0546  02/28/19 0857  02/28/19 1800 03/01/19 0349 03/02/19 0500 03/02/19 0508 03/03/19 0140 03/03/19 0328  NA  --    < > 138   < > 137 140 140 141 142 142  K  --    < > 5.0   < > 4.7 4.3 3.4* 3.4* 3.7 3.7  CL  --   --  115*  --  113* 112* 108  --  111 110  CO2  --   --  13*  --  14* 15* 20*  --  22 22  GLUCOSE  --   --  156*  --  197* 185* 227*  --  151* 141*  BUN  --   --  35*  --  38* 44* 53*  --  58* 60*  CREATININE  --   --  1.92*  --  2.08* 2.37* 2.41*  --  2.03* 1.93*  CALCIUM  --   --  7.7*  --  7.4* 7.4* 7.5*  --  7.9* 8.1*  MG 0.9*  --  1.1*  --   --  1.9 1.9  --   --  2.4  PHOS 3.9  --   --   --   --  4.0 2.2*  --   --  3.5   < > = values in this interval not displayed.   Estimated Creatinine Clearance: 42.3 mL/min (A) (by C-G formula based on SCr of 1.93 mg/dL (H)).   LIVER Recent Labs  Lab 02/27/19 1534 02/28/19 0331 02/28/19 0857 03/01/19 0349  03/02/19 0500 03/03/19 0328  AST 20  --   --   --   --   --   ALT 15  --   --   --   --   --  ALKPHOS 41  --   --   --   --   --   BILITOT 2.0*  --   --   --   --   --   PROT 6.0*  --   --   --   --   --   ALBUMIN 3.3*  --   --   --  2.1* 2.2*  INR  --  7.4* 5.5* 4.6* 3.0* 1.9*     INFECTIOUS Recent Labs  Lab 02/27/19 1830 02/28/19 0331 02/28/19 0546 03/01/19 0349 03/03/19 0032  LATICACIDVEN 4.0* 2.8*  --   --  1.4  PROCALCITON  --  71.92 147.88 >150.00  --      ENDOCRINE CBG (last 3)  Recent Labs    03/03/19 0342 03/03/19 0745 03/03/19 1130  GLUCAP 121* 112* 134*         IMAGING x48h  - image(s) personally visualized  -   highlighted in bold US Renal Transplant W/doppler  Result Date: 03/01/2019 CLINICAL DATA:  Acute renal failure. EXAM: ULTRASOUND OF RENAL TRANSPLANT WITH RENAL DOPPLER ULTRASOUND TECHNIQUE: Ultrasound examination of the renal transplant was performed with gray-scale, color and duplex doppler evaluation. COMPARISON:  CT dated 02/27/2019. FINDINGS: Transplant kidney location: Right pelvis. Transplant Kidney: Renal measurements: 10 x 6.1 x 7.5 cm = volume: 264mL. Normal in size and parenchymal echogenicity. No evidence of mass or hydronephrosis. No peri-transplant fluid collection seen. Color flow in the main renal artery:  Yes Color flow in the main renal vein:  Yes Duplex Doppler Evaluation: Main Renal Artery Resistive Index: 0.76 Venous waveform in main renal vein:  Present Intrarenal resistive index in upper pole:  0.71 (normal 0.6-0.8; equivocal 0.8-0.9; abnormal >= 0.9) Intrarenal resistive index in lower pole: 0.69 (normal 0.6-0.8; equivocal 0.8-0.9; abnormal >= 0.9) Bladder: Decompressed with a Foley catheter. Other findings: There is a small amount of free fluid in the right lower quadrant which is new since prior CT. IMPRESSION: 1. Unremarkable appearance of the right lower quadrant transplant kidney without evidence of hydronephrosis. The  resistive indices are within the normal range. 2. Increasing free fluid in the right lower quadrant when compared to CT dated 02/27/2019. Electronically Signed   By: Constance Holster M.D.   On: 03/01/2019 19:19   Dg Chest Port 1 View  Result Date: 03/02/2019 CLINICAL DATA:  Respiratory failure EXAM: PORTABLE CHEST 1 VIEW COMPARISON:  Chest radiograph 03/01/2019 FINDINGS: ET tube terminates in the mid trachea. Monitoring leads overlie the patient. Enteric tube courses inferior to the diaphragm. Low lung volumes. Cardiomegaly. Aortic atherosclerosis. Similar-appearing left greater than right airspace opacities. Small bilateral pleural effusions. No pneumothorax. IMPRESSION: Stable support apparatus. Re demonstrated consolidation within the left mid and lower lung and right lung base. Small bilateral pleural effusions. Electronically Signed   By: Lovey Newcomer M.D.   On: 03/02/2019 07:38      Assessment & Plan:   ASSESSMENT / PLAN:  PULMONARY A:  Vent dependent respiratory failure secondary to pneumonia requiring intubation on 02/28/2019 05.30   03/03/2019 -> stable post extubation x 24h  P:   Pulmonary toilet    NEUROLOGIC A:   Agitated but ? Delirium v discomfort of ET tube v both on 03/02/2019  - 03/03/2019 - improving mild encephalopathy   P:   Stay off precedex Supportive care Anticipate improvement   VASCULAR A:   Hx: DVT - on coumadin  - at admit:  INR 7.4 6/4 supratherapeutic INR  Current: Septic shock this admit.  Resolved  03/01/2019 - 2 units FFP and vitamin K given 02/28/2019 with INR on 03/01/2019 4.6. 03/03/2019 -off vasopressors approx 48h  P:  Map goal > 65 No anticoagulation till INR < 2 - warfarin consult called   CARDIAC A: Hx of HTN and HLD Trop bump - reflective of type 2 NSTEMI  6/7 - trop down trending  P: Continue statin await echo results from 03/02/2019   INFECTIOUS A:   Baseline: immune supprssed  Multifocal PNA with acute hypoxic respiratory  failure.:  Acenitobacter in blood - septicemia Diarrhea -  Resolved before testing   P:   Unasyn    RENAL A:  Hx ESRD s/p renal transplant - followed at [facility].- last seen 02/25/2019  - preadmission immunosuppressive,tacrolimus, mycophenolate  and empiric Bactrim   6/7 = AKI improved  P:  Continue Renal following - no CRRT needed Cotninue Prograf Continue Bactrim proph Holding mycophenolate till discharge and abx completed  ELECTROLYTES A:  Normal P: monitor   GASTROINTESTINAL A:   diarrrhea esolved  P:   H2 blockade  HEMATOLOGIC A:  Anemia Thrombocytopenia   P:  - PRBC for hgb </= 6.9gm%    - exceptions are   -  if ACS susepcted/confirmed then transfuse for hgb </= 8.0gm%,  or    -  active bleeding with hemodynamic instability, then transfuse regardless of hemoglobin value   At at all times try to transfuse 1 unit prbc as possible with exception of active hemorrhage     ENDOCRINE A:   At risk RAI   P:   Steroids with immune suppression - reduce hydrocort dose 03/03/2019 and then on 6/8 plan oral baseline transition  MSK/DERM Not examined  ONCOLOGY - Possible right native renal neoplasm noted on CT. - Small prostatic abnormality noted on CT - neoplasm not excluded  - PSA > 60 on 02/28/2019  Plan  - urology consult at some point     Best Practice:  Diet: advance diet Pain/Anxiety/Delirium protocol (if indicated): dc  VAP protocol (if indicated): N/A. DVT prophylaxis: SCD's /supra therapeutic INR GI prophylaxis: N/A. Glucose control: SSI. Mobility: OOB chair - waked 8 feet Code Status: Full. Family Communication: 02/28/2011 wife [name] Warnell updated at length.  Permission granted for temporary Vas-Cath and arterial line.  03/01/2011 note Vas-Cath was not placed deferred at this time. Renal following.  6/5 wife updated at length, questions answered.    Disposition: IMoe to med surg and ccm off with TRH primary from 03/04/19 - d/w Dr  Karleen Hampshire       ATTESTATION & SIGNATURE   The patient Ryan Rice is critically ill with multiple organ systems failure and requires high complexity decision making for assessment and support, frequent evaluation and titration of therapies, application of advanced monitoring technologies and extensive interpretation of multiple databases.   Critical Care Time devoted to patient care services described in this note is  30  Minutes. This time reflects time of care of this signee Dr Brand Males. This critical care time does not reflect procedure time, or teaching time or supervisory time of PA/NP/Med student/Med Resident etc but could involve care discussion time     Dr. Brand Males, M.D., [facility].C.P Pulmonary and Critical Care Medicine Staff Physician [facility] Pager: [phone], If no answer or between  15:00h - 7:00h: call [phone]  03/03/2019 12:00 PM

## 2019-03-03 NOTE — Evaluation (Signed)
Occupational Therapy Evaluation Patient Details Name: Ryan Rice MRN: [id number] DOB: 08/02/1951 Today's Date: 03/03/2019    History of Present Illness Pt adm with respiratory failure due to PNA. Pt intubated 6/4 and extubated 6/7. PMH - ESRD with renal transplant, DM, HTN, DVT, PVD, gout   Clinical Impression   Pt PTA: living with spouse; independent prior. Pt currently limited by poor orientation, decreased strength, decreased mobility, edema in L >R UEs and decreased awareness of deficits. Pt currently performing ADL functional transfers with mod to maxA+2 transfer pivot from recliner to bed- pt difficulty sequencing for side stepping. Pt minA for UB ADL and LB ADL with maxA. Pt is far from cognitive baseline at this time. Pt difficulty sequencing and attending to tasks. Pt requires continued OT skilled services for ADL, mobility and energy conservation. OT following acutely.      Follow Up Recommendations  CIR;Supervision/Assistance - 24 hour    Equipment Recommendations  Other (comment)(to be determined at next venue)    Recommendations for Other Services       Precautions / Restrictions Precautions Precautions: Fall Restrictions Weight Bearing Restrictions: No      Mobility Bed Mobility Overal bed mobility: Needs Assistance Bed Mobility: Supine to Sit;Sit to Supine     Supine to sit: Min guard;HOB elevated     General bed mobility comments: Incr time. Assist for safety and lines  Transfers Overall transfer level: Needs assistance Equipment used: Rolling walker (2 wheeled) Transfers: Sit to/from [facility] Sit to Stand: Max assist;+2 physical assistance;+2 safety/equipment Stand pivot transfers: Max assist;+2 physical assistance;+2 safety/equipment       General transfer comment: Assist to bring hips up and for balance. Legs unstable. Pivoted chair to bed with rollator. Short shuffling steps. Verbal cues for hand placement    Balance  Overall balance assessment: Needs assistance Sitting-balance support: No upper extremity supported;Feet supported Sitting balance-Leahy Scale: Fair     Standing balance support: Bilateral upper extremity supported Standing balance-Leahy Scale: Poor Standing balance comment: RW             High level balance activites: Side stepping High Level Balance Comments: very difficult for pt to comprehend moving BLEs to right            ADL either performed or assessed with clinical judgement   ADL Overall ADL's : Needs assistance/impaired     Grooming: Wash/dry hands;Wash/dry face;Set up;Sitting   Upper Body Bathing: Minimal assistance;Sitting   Lower Body Bathing: Maximal assistance;Sitting/lateral leans;Sit to/from stand;Cueing for sequencing;Cueing for safety   Upper Body Dressing : Minimal assistance;Sitting   Lower Body Dressing: Maximal assistance;Cueing for safety;Cueing for sequencing;Sitting/lateral leans;Sit to/from stand   Toilet Transfer: Maximal assistance;+2 for physical assistance;+2 for safety/equipment;BSC   Toileting- Clothing Manipulation and Hygiene: Maximal assistance;+2 for physical assistance;+2 for safety/equipment;Sitting/lateral lean;Sit to/from stand       Functional mobility during ADLs: (deferred) General ADL Comments: Pt limited by weakness, poor attention, decreased cognitive abilities and decreased strength requiring assist and sequencing assist for ADL     Vision Baseline Vision/History: No visual deficits Vision Assessment?: No apparent visual deficits     Perception     Praxis      Pertinent Vitals/Pain Pain Assessment: No/denies pain     Hand Dominance Right   Extremity/Trunk Assessment Upper Extremity Assessment Upper Extremity Assessment: RUE deficits/detail;LUE deficits/detail;Generalized weakness RUE Deficits / Details: weakness 3-/5 MM grade LUE Deficits / Details: Decreased strenth 2+/5 MM grade, edematous LUE  Coordination: decreased fine motor;decreased  gross motor   Lower Extremity Assessment Lower Extremity Assessment: Defer to PT evaluation   Cervical / Trunk Assessment Cervical / Trunk Assessment: Kyphotic   Communication Communication Communication: No difficulties   Cognition Arousal/Alertness: Awake/alert   Overall Cognitive Status: Impaired/Different from baseline Area of Impairment: Orientation;Memory;Attention;Following commands;Safety/judgement;Awareness;Problem solving                 Orientation Level: Disoriented to;Place;Situation Current Attention Level: Sustained Memory: Decreased short-term memory Following Commands: Follows one step commands with increased time Safety/Judgement: Decreased awareness of safety;Decreased awareness of deficits Awareness: Intellectual Problem Solving: Slow processing;Decreased initiation;Requires verbal cues;Requires tactile cues     General Comments  RA >90% O2    Exercises     Shoulder Instructions      Home Living Family/patient expects to be discharged to:: Private residence Living Arrangements: Spouse/significant other Available Help at Discharge: Family;Available 24 hours/day Type of Home: House Home Access: Stairs to enter [employer] of Steps: 3 Entrance Stairs-Rails: None Home Layout: One level     Bathroom Shower/Tub: Teacher, early years/pre: Standard     Home Equipment: Environmental consultant - 2 wheels          Prior Functioning/Environment Level of Independence: Independent        Comments: Mowed yard on day of admit        OT Problem List: Decreased strength;Decreased activity tolerance;Impaired balance (sitting and/or standing);Decreased coordination;Decreased cognition;Decreased safety awareness;Decreased knowledge of use of DME or AE;Impaired UE functional use;Pain;Increased edema      OT Treatment/Interventions: Self-care/ADL training;Therapeutic exercise;Neuromuscular  education;Energy conservation;DME and/or AE instruction;Therapeutic activities;Patient/family education;Balance training    OT Goals(Current goals can be found in the care plan section) Acute Rehab OT Goals Patient Stated Goal: go home OT Goal Formulation: With patient Time For Goal Achievement: 03/17/19 Potential to Achieve Goals: Good  OT Frequency: Min 3X/week   Barriers to D/C:            Co-evaluation              AM-PAC OT "6 Clicks" Daily Activity     Outcome Measure Help from another person eating meals?: A [place] Help from another person taking care of personal grooming?: A [place] Help from another person toileting, which includes using toliet, bedpan, or urinal?: Total Help from another person bathing (including washing, rinsing, drying)?: A Lot Help from another person to put on and taking off regular upper body clothing?: A [place] Help from another person to put on and taking off regular lower body clothing?: A Lot 6 Click Score: 14   End of Session Equipment Utilized During Treatment: Gait belt;Rolling walker Nurse Communication: Mobility status  Activity Tolerance: Patient tolerated treatment well Patient left: in bed;with call bell/phone within reach;with bed alarm set  OT Visit Diagnosis: Unsteadiness on feet (R26.81);Muscle weakness (generalized) (M62.81);Other symptoms and signs involving cognitive function                Time: 1130-1200 OT Time Calculation (min): 30 min Charges:  OT General Charges $OT Visit: 1 Visit OT Evaluation $OT Eval Moderate Complexity: 1 Mod OT Treatments $Self Care/Home Management : 8-22 mins  Ebony Hail Harold Hedge) Marsa Aris OTR/L Acute Rehabilitation Services Pager: [phone] Office: [place] 03/03/2019, 3:28 PM

## 2019-03-03 NOTE — Progress Notes (Signed)
[place] Progress Note Patient Name: Ryan Rice DOB: 12-Jul-1951 MRN: [id number]   Date of Service  03/03/2019  HPI/Events of Note  Hypertension - Request to resume home Flomax and Labetalol.   eICU Interventions  Will order: 1. Flomax 0.4 mg PO now and Q day. 2. Labetalol 100 mg PO BID. Hold dose for HR < 60 or SBP < 110.      Intervention Category Major Interventions: Hypertension - evaluation and management;Other:  Sommer,Steven Cornelia Copa 03/03/2019, 7:22 PM

## 2019-03-03 NOTE — Progress Notes (Signed)
ANTICOAGULATION CONSULT NOTE - Initial Consult  Pharmacy Consult for Warfarin Indication: History of DVT  Allergies  Allergen Reactions  . Iodinated Diagnostic Agents Hives  . Tape Hives    Patient Measurements: Height: 5\' 11"  (180.3 cm) Weight: 200 lb 9.9 oz (91 kg) IBW/kg (Calculated) : 75.3  Heparin dosing Wt: 85  Vital Signs: Temp: 97.8 F (36.6 C) (06/07 1131) Temp Source: Oral (06/07 1131) BP: 165/95 (06/07 1051) Pulse Rate: 77 (06/07 1051)  Labs: Recent Labs    02/28/19 1355  03/01/19 0349 03/02/19 0500 03/02/19 0508 03/03/19 0140 03/03/19 0328  HGB  --    < > 10.8* 10.1* 9.9*  --  10.6*  HCT  --    < > 33.9* 31.0* 29.0*  --  33.4*  PLT  --   --  78* 70*  --   --  67*  LABPROT  --   --  42.8* 30.9*  --   --  21.2*  INR  --   --  4.6* 3.0*  --   --  1.9*  CREATININE  --    < > 2.37* 2.41*  --  2.03* 1.93*  TROPONINI 0.37*  --   --   --   --   --  0.23*   < > = values in this interval not displayed.    Estimated Creatinine Clearance: 42.3 mL/min (A) (by C-G formula based on SCr of 1.93 mg/dL (H)).   Medical History: Past Medical History:  Diagnosis Date  . Kidney transplant recipient    Assessment: 68 yo M Transfer from outside hospital with diarrhea/dyspnea. On warfarin PTA for history of DVT. Pt was briefly on heparin gtt today, now pharmacy consulted to manage warfarin therapy.  INR today 1.9  PTA warfarin regimen: 5mg  on Wed/Sun, 2.5mg  all other days  Goal of Therapy:  Heparin level 0.3-0.7 units/ml Monitor platelets by anticoagulation protocol: Yes   Plan:  Discontinue heparin gtt  Warfarin 5mg  PO x1  Daily INR, CBC  [place] Guardian, PharmD PGY1 Pharmacy Resident 03/03/2019    12:35 PM Please check AMION for all [place] numbers

## 2019-03-03 NOTE — Progress Notes (Signed)
Subjective:  Stable overnight- BP better-    UOP picking up, crt down slightly - extubated ! Very happy that his kidney is OK    Objective Vital signs in last 24 hours: Vitals:   03/03/19 0300 03/03/19 0400 03/03/19 0445 03/03/19 0500  BP: 138/82 140/75    Pulse: 70 68  69  Resp: 11 10  15   Temp:   97.9 F (36.6 C)   TempSrc:   Oral   SpO2: 95% 95%  95%  Weight:  91 kg    Height:       Weight change: -1.6 kg  Intake/Output Summary (Last 24 hours) at 03/03/2019 1660 Last data filed at 03/03/2019 0500 Gross per 24 hour  Intake 1387.39 ml  Output 900 ml  Net 487.39 ml    Assessment/Plan:68 year old BM with history of a kidney transplant and other medical issues now presenting with sepsis/ sepsis/bacteremia 1.Renal- known history of kidney transplant with crt of 1.44 just days PTA.  Now with A on CRF in the setting of sepsis.  Due to excellent care from CCM has improved rapidly- more hemodynamically stable.  Making urine - more now- no indications for dialysis at present- even if gets worse before gets better may not develop true HD need - has patent AVF 2. Sepsis  - bacteremia in an immunocompromised patient.  Broad spectrum antibiotics and supportive care.  Stop myfortic in the setting of active infection 3.  Immune suppression- home reg prograf 2/2- myfortic 360 BID and pred 10-  Is currently on prograf 2/2 and stress dose steroids  4. Metabolic acidosis  - improved  5.  Hyperkalemia- better-  6.  Anemia - does not appear to be a major issue right now  7.  HTN/vol-  Getting more volume overloaded but extubated  - no peripheral edema- now that making urine I think will correct on own- no lasix needed   [place]: Basic Metabolic Panel: Recent Labs  Lab 03/01/19 0349 03/02/19 0500 03/02/19 0508 03/03/19 0140 03/03/19 0328  NA 140 140 141 142 142  K 4.3 3.4* 3.4* 3.7 3.7  CL 112* 108  --  111 110  CO2 15* 20*  --  22 22  GLUCOSE 185* 227*  --  151*  141*  BUN 44* 53*  --  58* 60*  CREATININE 2.37* 2.41*  --  2.03* 1.93*  CALCIUM 7.4* 7.5*  --  7.9* 8.1*  PHOS 4.0 2.2*  --   --  3.5   Liver Function Tests: Recent Labs  Lab 02/27/19 1534 03/02/19 0500 03/03/19 0328  AST 20  --   --   ALT 15  --   --   ALKPHOS 41  --   --   BILITOT 2.0*  --   --   PROT 6.0*  --   --   ALBUMIN 3.3* 2.1* 2.2*   Recent Labs  Lab 02/27/19 1534  LIPASE 32   No results for input(s): AMMONIA in the last 168 hours. CBC: Recent Labs  Lab 02/27/19 1534  02/28/19 0546  03/01/19 0349 03/02/19 0500 03/02/19 0508 03/03/19 0328  WBC 3.8*  --  10.1  --  14.2* 12.9*  --  12.8*  NEUTROABS  --   --   --   --  10.8* 5.3  --  11.7*  HGB 13.9   < > 12.1*   < > 10.8* 10.1* 9.9* 10.6*  HCT 45.9   < >  39.7   < > 33.9* 31.0* 29.0* 33.4*  MCV 88.8  --  91.1  --  86.5 84.0  --  85.6  PLT 119*  --  86*  --  78* 70*  --  67*   < > = values in this interval not displayed.   Cardiac Enzymes: Recent Labs  Lab 02/27/19 1841 02/28/19 0331 02/28/19 0546 02/28/19 1355 03/03/19 0328  TROPONINI 0.25* 0.24* 0.34* 0.37* 0.23*   CBG: Recent Labs  Lab 03/02/19 1137 03/02/19 1540 03/02/19 2027 03/02/19 2323 03/03/19 0342  GLUCAP 174* 174* 172* 136* 121*    Iron Studies:  No results for input(s): IRON, TIBC, TRANSFERRIN, FERRITIN in the last 72 hours. Studies/Results: US Renal Transplant W/doppler  Result Date: 03/01/2019 CLINICAL DATA:  Acute renal failure. EXAM: ULTRASOUND OF RENAL TRANSPLANT WITH RENAL DOPPLER ULTRASOUND TECHNIQUE: Ultrasound examination of the renal transplant was performed with gray-scale, color and duplex doppler evaluation. COMPARISON:  CT dated 02/27/2019. FINDINGS: Transplant kidney location: Right pelvis. Transplant Kidney: Renal measurements: 10 x 6.1 x 7.5 cm = volume: 270mL. Normal in size and parenchymal echogenicity. No evidence of mass or hydronephrosis. No peri-transplant fluid collection seen. Color flow in the main renal  artery:  Yes Color flow in the main renal vein:  Yes Duplex Doppler Evaluation: Main Renal Artery Resistive Index: 0.76 Venous waveform in main renal vein:  Present Intrarenal resistive index in upper pole:  0.71 (normal 0.6-0.8; equivocal 0.8-0.9; abnormal >= 0.9) Intrarenal resistive index in lower pole: 0.69 (normal 0.6-0.8; equivocal 0.8-0.9; abnormal >= 0.9) Bladder: Decompressed with a Foley catheter. Other findings: There is a small amount of free fluid in the right lower quadrant which is new since prior CT. IMPRESSION: 1. Unremarkable appearance of the right lower quadrant transplant kidney without evidence of hydronephrosis. The resistive indices are within the normal range. 2. Increasing free fluid in the right lower quadrant when compared to CT dated 02/27/2019. Electronically Signed   By: Constance Holster M.D.   On: 03/01/2019 19:19   Dg Chest Port 1 View  Result Date: 03/02/2019 CLINICAL DATA:  Respiratory failure EXAM: PORTABLE CHEST 1 VIEW COMPARISON:  Chest radiograph 03/01/2019 FINDINGS: ET tube terminates in the mid trachea. Monitoring leads overlie the patient. Enteric tube courses inferior to the diaphragm. Low lung volumes. Cardiomegaly. Aortic atherosclerosis. Similar-appearing left greater than right airspace opacities. Small bilateral pleural effusions. No pneumothorax. IMPRESSION: Stable support apparatus. Re demonstrated consolidation within the left mid and lower lung and right lung base. Small bilateral pleural effusions. Electronically Signed   By: Lovey Newcomer M.D.   On: 03/02/2019 07:38   Medications: Infusions: . sodium chloride 10 mL/hr at 03/03/19 0500  . ampicillin-sulbactam (UNASYN) IV 3 g (03/03/19 0535)  . dexmedetomidine (PRECEDEX) IV infusion 0.2 mcg/kg/hr (03/03/19 0500)  . famotidine (PEPCID) IV Stopped (03/02/19 1203)  . thiamine injection Stopped (03/02/19 2253)    Scheduled Medications: . atorvastatin  10 mg Oral q1800  . chlorhexidine  15 mL Mouth Rinse  BID  . Chlorhexidine Gluconate Cloth  6 each Topical Q0600  . Chlorhexidine Gluconate Cloth  6 each Topical Daily  . folic acid  1 mg Intravenous Daily  . hydrocortisone sod succinate (SOLU-CORTEF) inj  50 mg Intravenous Q6H  . insulin aspart  0-20 Units Subcutaneous Q4H  . mouth rinse  15 mL Mouth Rinse q12n4p  . sodium chloride flush  10-40 mL Intracatheter Q12H  . sulfamethoxazole-trimethoprim  1 tablet Oral Q M,W,F  . tacrolimus  2 mg Oral  BID    have reviewed scheduled and prn medications.  Physical Exam  Gen:       Extubated- alert, happy that kidney is OK  HEENT:   PERRLA, EOMI Lungs:  Mostly clear  CV:  RRR Abd:  Soft, non tender  Ext:  Really no edema Large left  Aneurysmal AVF - patent     03/03/2019,6:23 AM  LOS: 4 days

## 2019-03-03 NOTE — Evaluation (Signed)
Clinical/Bedside Swallow Evaluation Patient Details  Name: Ryan Rice MRN: [id number] Date of Birth: Aug 11, 1951  Today's Date: 03/03/2019 Time: SLP Start Time (ACUTE ONLY): 1038 SLP Stop Time (ACUTE ONLY): 1100 SLP Time Calculation (min) (ACUTE ONLY): 22 min  Past Medical History:  Past Medical History:  Diagnosis Date  . Kidney transplant recipient    Past Surgical History: History reviewed. No pertinent surgical history. HPI:  Ryan Rice is a 68 y.o. male who has a PMH including but not limited to ESRD s/p renal transplant, DM, HTN, HLD, DVT on coumadin.  He presented to [facility] 6/3 with abdominal pain.  Had diarrhea that started the night prior after he had cut his grass.  Did report mild SOB and mild non productive cough.  No fevers/chills/sweats. In ED, he was found to be hypoxic with slightly increased WOB.  He was placed on NRB to maintain SpO2 in 90s.  CXR demonstrated LLL opacity.  CT A/P demonstrated extensive PNA bilaterally L > R.  Initial rapid COVID negative. Due to increased O2 needs and concern for deterioration, he was transferred to [facility] for further evaluation and management.  He was ultimately intubated for two days.  Most recent chest xray was showing consolidation in the left mid and lower lung as well as the right lung base.  In addition, small bilateral pleural effusions were seen.     Assessment / Plan / Recommendation Clinical Impression  Clinical swallowing evaluation was completed using thin liquids via spoon, cup and straw, pureed material and dry solids.  Cranial nerve exam was completed and unremarkable.  Lingual, labial, facial and jaw range of motion and strength appeared to be adequate.  The patient did not endorse any issues swallowing prior to admission.  His oral and pharyngeal swallow appeared to be functional.  Mastication of dry solids appeared to be functional with no oral residue seen.  Swallow trigger was appreciated to palpation with no overt s/s of  aspiration seen.  In addition, he passed the [facility] Swallowing Protocol suggesting the low likelihood for aspiration.  He was noted to have a very delayed cough well after completion of PO intake that did not appear to be related to intake.  Recommend begin a regular diet with thin liquids.  Given recent prolonged intubation ST will follow up for therapeutic diet tolerance.   SLP Visit Diagnosis: Dysphagia, unspecified (R13.10)    Aspiration Risk  Mild aspiration risk    Diet Recommendation   Regular with thin liquids  Medication Administration: Whole meds with liquid    Other  Recommendations Oral Care Recommendations: Oral care BID   Follow up Recommendations Other (comment)(TBD)      Frequency and Duration min 2x/week  2 weeks       Prognosis Prognosis for Safe Diet Advancement: Good      Swallow Study   General Date of Onset: 02/27/19 HPI: Ryan Rice is a 68 y.o. male who has a PMH including but not limited to ESRD s/p renal transplant, DM, HTN, HLD, DVT on coumadin.  He presented to [facility] 6/3 with abdominal pain.  Had diarrhea that started the night prior after he had cut his grass.  Did report mild SOB and mild non productive cough.  No fevers/chills/sweats. In ED, he was found to be hypoxic with slightly increased WOB.  He was placed on NRB to maintain SpO2 in 90s.  CXR demonstrated LLL opacity.  CT A/P demonstrated extensive PNA bilaterally L > R.  Initial rapid COVID negative. Due  to increased O2 needs and concern for deterioration, he was transferred to [facility] for further evaluation and management.  He was ultimately intubated for two days.  Most recent chest xray was showing consolidation in the left mid and lower lung as well as the right lung base.  In addition, small bilateral pleural effusions were seen.   Type of Study: Bedside Swallow Evaluation Previous Swallow Assessment: None noted at [facility] Diet Prior to this Study: NPO Temperature Spikes Noted: No Respiratory Status:  Nasal cannula History of Recent Intubation: Yes Length of Intubations (days): 2 days Date extubated: 03/02/19 Behavior/Cognition: Alert;Cooperative;Pleasant mood Oral Cavity Assessment: Within Functional Limits Oral Care Completed by SLP: No Oral Cavity - Dentition: Adequate natural dentition Vision: Functional for self-feeding Self-Feeding Abilities: Able to feed self Patient Positioning: Upright in chair Baseline Vocal Quality: Normal Volitional Swallow: Able to elicit    Oral/Motor/Sensory Function Overall Oral Motor/Sensory Function: Within functional limits   Ice Chips Ice chips: Not tested   Thin Liquid Thin Liquid: Within functional limits Presentation: Cup;Self Fed;Spoon;Straw    Nectar Thick Nectar Thick Liquid: Not tested   Honey Thick Honey Thick Liquid: Not tested   Puree Puree: Within functional limits Presentation: Spoon   Solid     Solid: Within functional limits Presentation: [place] [place] [place] [facility] SLP [phone]  Lamar Sprinkles 03/03/2019,11:10 AM

## 2019-03-03 NOTE — Progress Notes (Signed)
Rehab Admissions Coordinator Note:  Patient was screened by Michel Santee for appropriateness for an Inpatient Acute Rehab Consult.  At this time, we are recommending Inpatient Rehab consult. I will contact MD for order.  Shann Medal, PT, DPT Admissions Coordinator [phone] 03/03/19  12:16 PM

## 2019-03-03 NOTE — Evaluation (Signed)
Physical Therapy Evaluation Patient Details Name: Ryan Rice MRN: [id number] DOB: 14-Jan-1951 Today's Date: 03/03/2019   History of Present Illness  Pt adm with respiratory failure due to PNA. Pt intubated 6/4 and extubated 6/7. PMH - ESRD with renal transplant, DM, HTN, DVT, PVD, gout  Clinical Impression  Pt admitted with above diagnosis and presents to PT with functional limitations due to deficits listed below (See PT problem list). Pt needs skilled PT to maximize independence and safety to allow discharge to CIR. Pt with significant mobility and cognitive deficits.      Follow Up Recommendations CIR    Equipment Recommendations  Other (comment)(To be determined)    Recommendations for Other Services Rehab consult     Precautions / Restrictions Precautions Precautions: Fall      Mobility  Bed Mobility Overal bed mobility: Needs Assistance Bed Mobility: Supine to Sit     Supine to sit: Min guard;HOB elevated     General bed mobility comments: Incr time. Assist for safety and lines  Transfers Overall transfer level: Needs assistance Equipment used: 4-wheeled walker Transfers: Sit to/from [facility] Sit to Stand: +2 physical assistance;Mod assist Stand pivot transfers: +2 physical assistance;Mod assist       General transfer comment: Assist to bring hips up and for balance. Legs unstable. Pivoted bed to chair with rollator. Short shuffling steps. Verbal cues for hand placement  Ambulation/Gait Ambulation/Gait assistance: Mod assist;+2 safety/equipment Gait Distance (Feet): 8 Feet Assistive device: 4-wheeled walker Gait Pattern/deviations: Step-to pattern;Decreased step length - right;Decreased step length - left;Shuffle;Trunk flexed Gait velocity: decr Gait velocity interpretation: <1.31 ft/sec, indicative of household ambulator General Gait Details: Assist with balance and support. Close chair follow. Legs unstable.   Stairs             Wheelchair Mobility    Modified Rankin (Stroke Patients Only)       Balance Overall balance assessment: Needs assistance Sitting-balance support: No upper extremity supported;Feet supported Sitting balance-Leahy Scale: Fair     Standing balance support: Bilateral upper extremity supported Standing balance-Leahy Scale: Poor Standing balance comment: rollator and min to mod assist for static standing                             Pertinent Vitals/Pain Pain Assessment: No/denies pain    Home Living Family/patient expects to be discharged to:: Private residence Living Arrangements: Spouse/significant other Available Help at Discharge: Family;Available 24 hours/day Type of Home: House Home Access: Stairs to enter Entrance Stairs-Rails: None Entrance Stairs-Number of Steps: 3 Home Layout: One level Home Equipment: Walker - 2 wheels      Prior Function Level of Independence: Independent         Comments: Mowed yard on day of admit     Hand Dominance   Dominant Hand: Right    Extremity/Trunk Assessment   Upper Extremity Assessment Upper Extremity Assessment: Defer to OT evaluation    Lower Extremity Assessment Lower Extremity Assessment: Generalized weakness       Communication   Communication: No difficulties  Cognition Arousal/Alertness: Awake/alert   Overall Cognitive Status: Impaired/Different from baseline Area of Impairment: Orientation;Memory;Attention;Following commands;Safety/judgement;Awareness;Problem solving                 Orientation Level: Disoriented to;Place;Situation Current Attention Level: Sustained Memory: Decreased short-term memory Following Commands: Follows one step commands with increased time Safety/Judgement: Decreased awareness of safety;Decreased awareness of deficits Awareness: Intellectual Problem Solving: Slow processing;Decreased initiation;Requires  verbal cues;Requires tactile cues         General Comments General comments (skin integrity, edema, etc.): Amb on RA with SpO2 92%    Exercises     Assessment/Plan    PT Assessment Patient needs continued PT services  PT Problem List Decreased strength;Decreased activity tolerance;Decreased balance;Decreased mobility;Decreased cognition;Decreased knowledge of use of DME;Decreased safety awareness;Obesity       PT Treatment Interventions DME instruction;Gait training;Stair training;Functional mobility training;Therapeutic activities;Therapeutic exercise;Balance training;Cognitive remediation;Patient/family education    PT Goals (Current goals can be found in the Care Plan section)  Acute Rehab PT Goals Patient Stated Goal: go home PT Goal Formulation: With patient Time For Goal Achievement: 03/17/19 Potential to Achieve Goals: Good    Frequency Min 3X/week   Barriers to discharge Inaccessible home environment stairs to enter    Co-evaluation               AM-PAC PT "6 Clicks" Mobility  Outcome Measure Help needed turning from your back to your side while in a flat bed without using bedrails?: None Help needed moving from lying on your back to sitting on the side of a flat bed without using bedrails?: A [place] Help needed moving to and from a bed to a chair (including a wheelchair)?: A Lot Help needed standing up from a chair using your arms (e.g., wheelchair or bedside chair)?: A Lot Help needed to walk in hospital room?: A Lot Help needed climbing 3-5 steps with a railing? : Total 6 Click Score: 14    End of Session Equipment Utilized During Treatment: Gait belt Activity Tolerance: Patient limited by fatigue Patient left: in chair;with call bell/phone within reach;with chair alarm set Nurse Communication: Mobility status PT Visit Diagnosis: Other abnormalities of gait and mobility (R26.89);Muscle weakness (generalized) (M62.81)    Time: 4315-4008 PT Time Calculation (min) (ACUTE ONLY): 29  min   Charges:   PT Evaluation $PT Eval Moderate Complexity: 1 Mod PT Treatments $Gait Training: 8-22 mins        Shafer Pager [phone] Office [place] 03/03/2019, 10:46 AM

## 2019-03-03 NOTE — Progress Notes (Signed)
ANTICOAGULATION CONSULT NOTE - Initial Consult  Pharmacy Consult for Heparin (warfarin on hold) Indication: History of DVT  Allergies  Allergen Reactions  . Iodinated Diagnostic Agents Hives  . Tape Hives    Patient Measurements: Height: 5\' 11"  (180.3 cm) Weight: 200 lb 9.9 oz (91 kg) IBW/kg (Calculated) : 75.3  Heparin dosing Wt: 85  Vital Signs: Temp: 97.9 F (36.6 C) (06/07 0445) Temp Source: Oral (06/07 0445) BP: 151/67 (06/07 0600) Pulse Rate: 68 (06/07 0600)  Labs: Recent Labs    02/28/19 0857  02/28/19 1355  03/01/19 0349 03/02/19 0500 03/02/19 0508 03/03/19 0140 03/03/19 0328  HGB  --    < >  --   --  10.8* 10.1* 9.9*  --  10.6*  HCT  --    < >  --   --  33.9* 31.0* 29.0*  --  33.4*  PLT  --   --   --   --  78* 70*  --   --  67*  APTT 52*  --   --   --   --   --   --   --   --   LABPROT 49.4*  --   --   --  42.8* 30.9*  --   --  21.2*  INR 5.5*  --   --   --  4.6* 3.0*  --   --  1.9*  CREATININE 1.92*  --   --    < > 2.37* 2.41*  --  2.03* 1.93*  TROPONINI  --   --  0.37*  --   --   --   --   --  0.23*   < > = values in this interval not displayed.    Estimated Creatinine Clearance: 42.3 mL/min (A) (by C-G formula based on SCr of 1.93 mg/dL (H)).   Medical History: Past Medical History:  Diagnosis Date  . Kidney transplant recipient    Assessment: 68 yo M Transfer from outside hospital with diarrhea/dyspnea. On warfarin PTA for history of DVT. Pharmacy consulted to start heparin when INR < 2.0. Admission INR high, given FFP vit K.   INR today 1.9, will start heparin gtt. Platelets 67K today, monitor closely  Goal of Therapy:  Heparin level 0.3-0.7 units/ml Monitor platelets by anticoagulation protocol: Yes   Plan:  Start Heparin gtt at 1300 units/hr  6 hr level Daily CBC, heparin level while on heparin  Follow up warfarin restart, daily INR  Harrietta Guardian, PharmD PGY1 Pharmacy Resident 03/03/2019    7:33 AM Please check AMION for all [place] numbers

## 2019-03-04 LAB — GLUCOSE, CAPILLARY
Glucose-Capillary: 180 mg/dL — ABNORMAL HIGH (ref 70–99)
Glucose-Capillary: 162 mg/dL — ABNORMAL HIGH (ref 70–99)
Glucose-Capillary: 154 mg/dL — ABNORMAL HIGH (ref 70–99)
Glucose-Capillary: 185 mg/dL — ABNORMAL HIGH (ref 70–99)
Glucose-Capillary: 252 mg/dL — ABNORMAL HIGH (ref 70–99)
Glucose-Capillary: 140 mg/dL — ABNORMAL HIGH (ref 70–99)

## 2019-03-04 LAB — PROTIME-INR
INR: 1.6 — ABNORMAL HIGH (ref 0.8–1.2)
Prothrombin Time: 19.1 seconds — ABNORMAL HIGH (ref 11.4–15.2)

## 2019-03-04 LAB — RENAL FUNCTION PANEL
Albumin: 2.3 g/dL — ABNORMAL LOW (ref 3.5–5.0)
Anion gap: 9 (ref 5–15)
BUN: 53 mg/dL — ABNORMAL HIGH (ref 8–23)
CO2: 23 mmol/L (ref 22–32)
Calcium: 8.2 mg/dL — ABNORMAL LOW (ref 8.9–10.3)
Chloride: 109 mmol/L (ref 98–111)
Creatinine, Ser: 1.71 mg/dL — ABNORMAL HIGH (ref 0.61–1.24)
GFR calc Af Amer: 47 mL/min — ABNORMAL LOW (ref >60)
GFR calc non Af Amer: 40 mL/min — ABNORMAL LOW (ref >60)
Glucose, Bld: 206 mg/dL — ABNORMAL HIGH (ref 70–99)
Phosphorus: 2.9 mg/dL (ref 2.5–4.6)
Potassium: 3.2 mmol/L — ABNORMAL LOW (ref 3.5–5.1)
Sodium: 141 mmol/L (ref 135–145)

## 2019-03-04 LAB — CBC WITH DIFFERENTIAL/PLATELET
Abs Immature Granulocytes: 0.63 10*3/uL — ABNORMAL HIGH (ref 0.00–0.07)
Basophils Absolute: 0 10*3/uL (ref 0.0–0.1)
Basophils Relative: 0 %
Eosinophils Absolute: 0 10*3/uL (ref 0.0–0.5)
Eosinophils Relative: 0 %
HCT: 35.8 % — ABNORMAL LOW (ref 39.0–52.0)
Hemoglobin: 11.4 g/dL — ABNORMAL LOW (ref 13.0–17.0)
Immature Granulocytes: 6 %
Lymphocytes Relative: 2 %
Lymphs Abs: 0.3 10*3/uL — ABNORMAL LOW (ref 0.7–4.0)
MCH: 27 pg (ref 26.0–34.0)
MCHC: 31.8 g/dL (ref 30.0–36.0)
MCV: 84.8 fL (ref 80.0–100.0)
Monocytes Absolute: 0.8 10*3/uL (ref 0.1–1.0)
Monocytes Relative: 7 %
Neutro Abs: 9.4 10*3/uL — ABNORMAL HIGH (ref 1.7–7.7)
Neutrophils Relative %: 85 %
Platelets: 81 10*3/uL — ABNORMAL LOW (ref 150–400)
RBC: 4.22 MIL/uL (ref 4.22–5.81)
RDW: 16 % — ABNORMAL HIGH (ref 11.5–15.5)
WBC: 11.1 10*3/uL — ABNORMAL HIGH (ref 4.0–10.5)
nRBC: 0.3 % — ABNORMAL HIGH (ref 0.0–0.2)

## 2019-03-04 LAB — BASIC METABOLIC PANEL
Anion gap: 11 (ref 5–15)
BUN: 56 mg/dL — ABNORMAL HIGH (ref 8–23)
CO2: 22 mmol/L (ref 22–32)
Calcium: 8 mg/dL — ABNORMAL LOW (ref 8.9–10.3)
Chloride: 108 mmol/L (ref 98–111)
Creatinine, Ser: 1.82 mg/dL — ABNORMAL HIGH (ref 0.61–1.24)
GFR calc Af Amer: 43 mL/min — ABNORMAL LOW (ref >60)
GFR calc non Af Amer: 37 mL/min — ABNORMAL LOW (ref >60)
Glucose, Bld: 192 mg/dL — ABNORMAL HIGH (ref 70–99)
Potassium: 3.5 mmol/L (ref 3.5–5.1)
Sodium: 141 mmol/L (ref 135–145)

## 2019-03-04 LAB — MAGNESIUM: Magnesium: 2.1 mg/dL (ref 1.7–2.4)

## 2019-03-04 LAB — HEMOGLOBIN A1C
Hgb A1c MFr Bld: 6.2 % — ABNORMAL HIGH (ref 4.8–5.6)
Mean Plasma Glucose: 131.24 mg/dL

## 2019-03-04 LAB — MYCOPHENOLIC ACID (CELLCEPT)
MPA Glucuronide: 51 ug/mL (ref 15–125)
MPA: 0.7 ug/mL — ABNORMAL LOW (ref 1.0–3.5)

## 2019-03-04 MED ORDER — WARFARIN SODIUM 5 MG PO TABS
5.0000 mg | ORAL_TABLET | Freq: Once | ORAL | Status: AC
  Administered 2019-03-04: 5 mg via ORAL
  Filled 2019-03-04: qty 1

## 2019-03-04 MED ORDER — HYDRALAZINE HCL 20 MG/ML IJ SOLN
10.0000 mg | INTRAMUSCULAR | Status: DC | PRN
  Administered 2019-03-04: 01:00:00 10 mg via INTRAVENOUS
  Filled 2019-03-04: qty 1

## 2019-03-04 MED ORDER — AMLODIPINE BESYLATE 5 MG PO TABS
5.0000 mg | ORAL_TABLET | Freq: Every day | ORAL | Status: DC
  Administered 2019-03-04 – 2019-03-08 (×5): 5 mg via ORAL
  Filled 2019-03-04 (×7): qty 1

## 2019-03-04 MED ORDER — NIFEDIPINE ER OSMOTIC RELEASE 60 MG PO TB24
60.0000 mg | ORAL_TABLET | Freq: Every day | ORAL | Status: DC
  Administered 2019-03-04: 60 mg via ORAL
  Filled 2019-03-04: qty 1

## 2019-03-04 MED ORDER — POTASSIUM CHLORIDE CRYS ER 20 MEQ PO TBCR
40.0000 meq | EXTENDED_RELEASE_TABLET | Freq: Once | ORAL | Status: AC
  Administered 2019-03-04: 13:00:00 40 meq via ORAL
  Filled 2019-03-04: qty 2

## 2019-03-04 MED ORDER — ENSURE ENLIVE PO LIQD
237.0000 mL | Freq: Two times a day (BID) | ORAL | Status: DC
  Administered 2019-03-04 – 2019-03-07 (×7): 237 mL via ORAL

## 2019-03-04 NOTE — Progress Notes (Signed)
Physical Therapy Treatment Patient Details Name: Ryan Rice MRN: [id number] DOB: [birth year] Today's Date: 03/04/2019    History of Present Illness Pt adm with respiratory failure due to PNA. Pt intubated 6/4 and extubated 6/7. PMH - ESRD with renal transplant, DM, HTN, DVT, PVD, gout    PT Comments    Pt admitted with above diagnosis. Pt currently with functional limitations due to balance and endurance deficits. Pt wasl able to ambulate with RW and incr distance today.  Continues to need +2 min to mod assist as pt is unsteady and needs assist to steer RW.  Needed close chair follow as well.  Pt is a great Rehab candidate. Will follow acutely.   Pt will benefit from skilled PT to increase their independence and safety with mobility to allow discharge to the venue listed below.     Follow Up Recommendations  CIR     Equipment Recommendations  Other (comment)(To be determined)    Recommendations for Other Services Rehab consult     Precautions / Restrictions Precautions Precautions: Fall Restrictions Weight Bearing Restrictions: No    Mobility  Bed Mobility Overal bed mobility: Needs Assistance Bed Mobility: Supine to Sit;Sit to Supine     Supine to sit: Min guard;HOB elevated     General bed mobility comments: Incr time. Assist for safety and lines  Transfers Overall transfer level: Needs assistance Equipment used: Rolling walker (2 wheeled) Transfers: Sit to/from [facility] Sit to Stand: Max assist;+2 physical assistance;+2 safety/equipment;Mod assist         General transfer comment: Assist to bring hips up and for balance. Legs unstable. Short shuffling steps. Verbal cues for hand placement  Ambulation/Gait Ambulation/Gait assistance: Mod assist;+2 safety/equipment Gait Distance (Feet): 75 Feet Assistive device: Rolling walker (2 wheeled) Gait Pattern/deviations: Step-to pattern;Decreased step length - right;Decreased step length -  left;Shuffle;Trunk flexed;Drifts right/left Gait velocity: decr Gait velocity interpretation: <1.31 ft/sec, indicative of household ambulator General Gait Details: Assist with balance and support. Close chair follow. Legs unstable. Pt did better the longer he walked.  Needed constant cues for upright posture.  Drifts left needing assist and cues to steer to right.    Stairs             Wheelchair Mobility    Modified Rankin (Stroke Patients Only)       Balance Overall balance assessment: Needs assistance Sitting-balance support: No upper extremity supported;Feet supported Sitting balance-Leahy Scale: Fair     Standing balance support: Bilateral upper extremity supported Standing balance-Leahy Scale: Poor Standing balance comment: RW and external support.                             Cognition Arousal/Alertness: Awake/alert   Overall Cognitive Status: Impaired/Different from baseline Area of Impairment: Orientation;Memory;Attention;Following commands;Safety/judgement;Awareness;Problem solving                 Orientation Level: Disoriented to;Place;Situation Current Attention Level: Sustained Memory: Decreased short-term memory Following Commands: Follows one step commands with increased time Safety/Judgement: Decreased awareness of safety;Decreased awareness of deficits Awareness: Intellectual Problem Solving: Slow processing;Decreased initiation;Requires verbal cues;Requires tactile cues        Exercises General Exercises - Lower Extremity Ankle Circles/Pumps: AROM;Both;10 reps;Seated Long Arc Quad: AROM;Both;10 reps;Seated    General Comments General comments (skin integrity, edema, etc.): VSS on 3LO2 as desat to 86% when O2 removed.       Pertinent Vitals/Pain Pain Assessment: No/denies pain  Home Living                      Prior Function            PT Goals (current goals can now be found in the care plan section)  Acute Rehab PT Goals Patient Stated Goal: go home Progress towards PT goals: Progressing toward goals    Frequency    Min 3X/week      PT Plan Current plan remains appropriate    Co-evaluation              AM-PAC PT "6 Clicks" Mobility   Outcome Measure  Help needed turning from your back to your side while in a flat bed without using bedrails?: None Help needed moving from lying on your back to sitting on the side of a flat bed without using bedrails?: A [place] Help needed moving to and from a bed to a chair (including a wheelchair)?: A Lot Help needed standing up from a chair using your arms (e.g., wheelchair or bedside chair)?: A Lot Help needed to walk in hospital room?: A Lot Help needed climbing 3-5 steps with a railing? : Total 6 Click Score: 14    End of Session Equipment Utilized During Treatment: Gait belt;Oxygen Activity Tolerance: Patient limited by fatigue Patient left: in chair;with call bell/phone within reach;with chair alarm set Nurse Communication: Mobility status PT Visit Diagnosis: Other abnormalities of gait and mobility (R26.89);Muscle weakness (generalized) (M62.81)     Time: 7793-9030 PT Time Calculation (min) (ACUTE ONLY): 24 min  Charges:  $Gait Training: 8-22 mins $Therapeutic Exercise: 8-22 mins                     [place] Pager:  [phone]  Office:  [place] 03/04/2019, 1:10 PM

## 2019-03-04 NOTE — Progress Notes (Signed)
Nutrition Follow-up RD working remotely.  DOCUMENTATION CODES:   Not applicable  INTERVENTION:    Ensure Enlive po BID, each supplement provides 350 kcal and 20 grams of protein    NUTRITION DIAGNOSIS:   Inadequate oral intake related to decreased appetite as evidenced by meal completion < 50%.  Ongoing   GOAL:   Patient will meet greater than or equal to 90% of their needs  Progressing  MONITOR:   PO intake, Supplement acceptance, Labs, Skin, I & O's  ASSESSMENT:   68 yo male with PMH of ESRD s/p renal transplant, DM, HTN, HLD, DVT who was admitted with diarrhea, multifocal PNA, sepsis. Required intubation early 6/4.  Extubated 6/6. TF off since extubation. Diet advanced to heart healthy, CHO modified yesterday S/P bedside swallow evaluation with SLP. Patient consumed 75% of lunch yesterday, only 25% of dinner yesterday and breakfast today.   Renal function is approaching baseline. Patient is now making urine, no indication for dialysis currently.   Labs reviewed. Potassium 3.2 (L), BUN 53 (H), creatinine 1.71 (H), phosphorus 2.9 WNL CBG's: [phone]  Medications reviewed and include folic acid, Solu-cortef, Novolog, Flomax, thiamine.   Per RN documentation, patient has mild pitting generalized edema, moderate pitting UE edema, and mild pitting LE edema.  Diet Order:   Diet Order            Diet heart healthy/carb modified Room service appropriate? Yes; Fluid consistency: Thin  Diet effective now              EDUCATION NEEDS:   No education needs have been identified at this time  Skin:  Skin Assessment: Reviewed RN Assessment  Last BM:  6/7 (type 6)  Height:   Ht Readings from Last 1 Encounters:  02/28/19 5\' 11"  (1.803 m)    Weight:   Wt Readings from Last 1 Encounters:  03/04/19 91.1 kg    Ideal Body Weight:  78.2 kg  BMI:  Body mass index is 28.01 kg/m.  Estimated Nutritional Needs:   Kcal:  2100-2300  Protein:  100-120  gm  Fluid:  >/= 2.1 L    Molli Barrows, RD, LDN, [place] Pager [phone] After Hours Pager [phone]

## 2019-03-04 NOTE — Progress Notes (Signed)
[facility] Progress Note Patient Name: Ryan Rice DOB: 1951-01-02 MRN: [id number]   Date of Service  03/04/2019  HPI/Events of Note  Hypertension - BP = 175/87.   eICU Interventions  Will order: 1. Procardia XL 60 mg PO now and Q day. 2. Hydralazine 10 mg IV Q 4 hours PRN SBP > 170 or DBP > 100.      Intervention Category Major Interventions: Hypertension - evaluation and management  Sommer,Steven Eugene 03/04/2019, 1:11 AM

## 2019-03-04 NOTE — Progress Notes (Signed)
Ryan Rice for Warfarin Indication: History of DVT  Allergies  Allergen Reactions  . Iodinated Diagnostic Agents Hives  . Tape Hives    Patient Measurements: Height: 5\' 11"  (180.3 cm) Weight: 200 lb 13.4 oz (91.1 kg) IBW/kg (Calculated) : 75.3  Heparin dosing Wt: 85  Vital Signs: Temp: 97.9 F (36.6 C) (06/08 1100) Temp Source: Axillary (06/08 1100) BP: 139/66 (06/08 1000) Pulse Rate: 78 (06/08 1000)  Labs: Recent Labs    03/02/19 0500 03/02/19 0508  03/03/19 0328 03/03/19 2353 03/04/19 0420  HGB 10.1* 9.9*  --  10.6*  --  11.4*  HCT 31.0* 29.0*  --  33.4*  --  35.8*  PLT 70*  --   --  67*  --  81*  LABPROT 30.9*  --   --  21.2*  --  19.1*  INR 3.0*  --   --  1.9*  --  1.6*  CREATININE 2.41*  --    < > 1.93* 1.82* 1.71*  TROPONINI  --   --   --  0.23*  --   --    < > = values in this interval not displayed.    Estimated Creatinine Clearance: 47.7 mL/min (A) (by C-G formula based on SCr of 1.71 mg/dL (H)).   Medical History: Past Medical History:  Diagnosis Date  . Kidney transplant recipient    Assessment: 68 yo M Transfer from outside hospital with diarrhea/dyspnea. On warfarin PTA for history of DVT. Pt was briefly on heparin gtt on 6/7, now pharmacy consulted to manage warfarin therapy.  INR today 1.6  PTA warfarin regimen: 5mg  on Wed/Sun, 2.5mg  all other days  Note possible drug-drug interaction with Bactrim, however this was also a PTA medication.  Goal of Therapy:  Heparin level 0.3-0.7 units/ml Monitor platelets by anticoagulation protocol: Yes   Plan:   Warfarin 5mg  PO x1  Daily INR, CBC  Vertis Kelch, PharmD PGY1 Pharmacy Resident Phone [phone] 03/04/2019       2:54 PM

## 2019-03-04 NOTE — Progress Notes (Signed)
[facility] Progress Note    Assessment/ Plan:   68 year old BM with history of a kidney transplant and other medical issues now presenting with sepsis/ sepsis/bacteremia 1.Renal-known history of kidney transplant with crt of 1.44 just days PTA. Now with A on CRF in the setting of sepsis. Due to excellent care from [facility] has improved rapidly- more hemodynamically stable. Making urine - more now- no indications for dialysis at present. 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 Would not study not to avoid contrast.  - Fortunately moving towards baseline; even a few months ago Cr was 1.7.  2.Sepsis-bacteremia in an immunocompromised patient. Broad spectrum antibiotics and supportive care. Stopped myfortic in the setting of active infection 3. Immune suppression-home reg prograf 2/2- myfortic 360 BID and pred 10- Is currently on prograf 2/2 and stress dose steroids. Plan on restarting Myfortic closer to time of d/c.  4.Metabolic acidosis- improved  5. Hyperkalemia-better-  6. Anemia- does not appear to be a major issue right now  7.  HTN/vol-  Getting more volume overloaded but extubated  - no peripheral edema. Restarted outpt regimen 10PM 6/6 and better controlled now.  Subjective:   Feeling much better with less dyspnea. No f/c/n/v.   Objective:   BP (!) 171/90   Pulse 75   Temp 98.6 F (37 C) (Oral)   Resp 11   Ht 5\' 11"  (1.803 m)   Wt 91.1 kg   SpO2 96%   BMI 28.01 kg/m   Intake/Output Summary (Last 24 hours) at 03/04/2019 0810 Last data filed at 03/04/2019 0400 Gross per 24 hour  Intake 1576.62 ml  Output 1375 ml  Net 201.62 ml   Weight change: 0.1 kg  Physical Exam: Gen:       Extubated- alert, happy that kidney is OK  HEENT:   PERRLA, EOMI Lungs:  Mostly clear  CV:  RRR Abd:  Soft, non tender  Ext:  Really no  edema Large left  Aneurysmal AVF - patent but "puffy" bruit  Imaging: No results found.  Labs: BMET Recent Labs  Lab 02/28/19 0546  02/28/19 1800 03/01/19 0349 03/02/19 0500 03/02/19 0508 03/03/19 0140 03/03/19 0328 03/03/19 2353 03/04/19 0420  NA  --    < > 137 140 140 141 142 142 141 141  K  --    < > 4.7 4.3 3.4* 3.4* 3.7 3.7 3.5 3.2*  CL  --    < > 113* 112* 108  --  111 110 108 109  CO2  --    < > 14* 15* 20*  --  22 22 22 23   GLUCOSE  --    < > 197* 185* 227*  --  151* 141* 192* 206*  BUN  --    < > 38* 44* 53*  --  58* 60* 56* 53*  CREATININE  --    < > 2.08* 2.37* 2.41*  --  2.03* 1.93* 1.82* 1.71*  CALCIUM  --    < > 7.4* 7.4* 7.5*  --  7.9* 8.1* 8.0* 8.2*  PHOS 3.9  --   --  4.0 2.2*  --   --  3.5  --  2.9   < > = values in this interval not displayed.   CBC Recent Labs  Lab 03/01/19 0349 03/02/19 0500 03/02/19 0508 03/03/19 0328 03/04/19 0420  WBC 14.2* 12.9*  --  12.8* 11.1*  NEUTROABS 10.8* 5.3  --  11.7* 9.4*  HGB 10.8* 10.1* 9.9* 10.6* 11.4*  HCT 33.9* 31.0* 29.0* 33.4* 35.8*  MCV 86.5 84.0  --  85.6 84.8  PLT 78* 70*  --  67* 81*    Medications:    . atorvastatin  10 mg Oral q1800  . chlorhexidine  15 mL Mouth Rinse BID  . Chlorhexidine Gluconate Cloth  6 each Topical Q0600  . Chlorhexidine Gluconate Cloth  6 each Topical Daily  . famotidine  20 mg Oral Daily  . folic acid  1 mg Oral Daily  . hydrocortisone sod succinate (SOLU-CORTEF) inj  50 mg Intravenous Q12H  . insulin aspart  0-20 Units Subcutaneous Q4H  . labetalol  100 mg Oral BID  . mouth rinse  15 mL Mouth Rinse q12n4p  . NIFEdipine  60 mg Oral Daily  . sodium chloride flush  10-40 mL Intracatheter Q12H  . sulfamethoxazole-trimethoprim  1 tablet Oral Q M,W,F  . tacrolimus  2 mg Oral BID  . tamsulosin  0.4 mg Oral Daily  . thiamine  250 mg Oral Daily  . Warfarin - Pharmacist Dosing Inpatient   Does not apply q1800      Otelia Santee, MD 03/04/2019, 8:10 AM

## 2019-03-04 NOTE — Progress Notes (Signed)
**Note De-Identified vi Obfusction** PROGRESS NOTE    Ryan Rice  GGE:[id number] DOB: [birth year] DO: 02/27/2019 PCP: System, Provider Not In   Brief Nrrtive:  Ryan Rice is  68 y.o. mle who hs  PMH including but not limited to ESRD s/p renl trnsplnt, DM, HTN, HLD, DVT on coumdin.  He presented to [facility]3 with bdominl pin.  Hd dirrhe tht strted the night prior fter he hd cut his grss.  Did report mild SOB nd mild non productive cough.  No fevers/chills/swets.  In ED, he ws found to be hypoxic with slightly incresed WOB.  He ws plced on NRB to mintin SpO2 in 90s.  CXR demonstrted LLL opcity.  CT /P demonstrted extensive PN bilterlly L > R.  Initil rpid COVID negtive.  Due to incresed O2 needs nd concern for deteriortion, he ws trnsferred to [facility] for further evlution nd mngement.  dmitted to ICU.  [facility].  ssessment & Pln:   ctive Problems:   Sepsis ([place])   cute renl filure (RF) ([place])   Community cquired pneumoni of left lower lobe of lung ([place])   Kidney trnsplnt recipient  cinetobcter Bcteremi  Multifocl Pneumoni:  Immunocompromised with hx renl trnsplnt Blood cx 6/3 with cinetobcter (1/2 with cog negtive stph, suspect contminnt) Cefepime 6/3 - 6/5 Unsyn 6/6 - present, continue for now Blood cx 6/8 pending  febrile, wbc mildly elevted Tper stress dose steroids Need to remove centrl line when ble, but unfortuntely L rm restricted nd R rm swelling, continue to monitor, remove when ble  cute on CKD  Hx of Renl trnsplnt: per renl note, cretinine 1.44 prior to dmission.  Bseline ~1.7 per renl notes  few months go. Peked t 2.41 Improving pprecite renl recs Continue progrf nd stress dose steroids Hold myfortic until closer to dischrge  cute Encephlopthy: improved, likely delirium 2/2 hospitliztion  Hx DVT  Suprtherpeutic INR on presenttion: s/p vitmin K nd FFP t presenttion  due to suprtherpeutic INR.   Wrfrin per phrmcy  Elevted Troponin: peked t 0.34. thought 2/2 demnd ischemi with sepsis in setting of ki on CKD.  Echo with mildly reduced EF (ws norml on 3/12).  No comment on WM.  Hd stress test on 12/06/2018 without evidence of ischemi. Will need outptient crds f/u 1c, lipids - on torv 10 t home  1.3 by 1.3 cm focus of decresed ttenution long leftwrd spect of prostte: PS is elevted, will discuss with urology prior to d/c  Noncystic Mss rising from ntive kidney on R: needs nonemergent MR of ntive kidney   Hypoklemi: replce nd follow  Thrombocytopeni: continue to monitor  Centrl Line: plced 6/4, will keep for now due to poor IV ccess  DVT prophylxis: wrfrin Code Sttus: full  Fmily Communiction: none t bedside Disposition Pln: pending further improvement   Consultnts:   Nephrology  PCCM   Procedures:  Echo IMPRESSIONS    1. The left ventricle hs mildly reduced systolic function, with n ejection frction of 45-50%. The cvity size ws norml. There is mildly incresed left ventriculr wll thickness. Left ventriculr distolic function could not be evluted secondry  to tril fibrilltion.  2. The right ventricle hs norml systolic function. The cvity ws norml. There is no increse in right ventriculr wll thickness.  3. Left tril size ws modertely dilted.  4. Right tril size ws modertely dilted.  5. The ortic vlve is bnorml. Moderte thickening of the ortic vlve. Moderte clcifiction of the ortic vlve. Mild stenosis of  the aortic valve. Moderate aortic annular calcification noted.  6. There is moderate mitral annular calcification present.  7. The interatrial septum was not assessed.  Antimicrobials: Anti-infectives (From admission, onward)   Start     Dose/Rate Route Frequency Ordered Stop   03/02/19 1400  Ampicillin-Sulbactam (UNASYN) 3 g in sodium  chloride 0.9 % 100 mL IVPB     3 g 200 mL/hr over 30 Minutes Intravenous Every 8 hours 03/02/19 1041     03/01/19 1500  imipenem-cilastatin (PRIMAXIN) 500 mg in sodium chloride 0.9 % 100 mL IVPB  Status:  Discontinued     500 mg 200 mL/hr over 30 Minutes Intravenous Every 8 hours 03/01/19 1430 03/02/19 1041   03/01/19 1000  sulfamethoxazole-trimethoprim (BACTRIM) 400-80 MG per tablet 1 tablet     1 tablet Oral Every M-W-F 02/28/19 0034     02/28/19 1700  vancomycin (VANCOCIN) IVPB 1000 mg/200 mL premix  Status:  Discontinued     1,000 mg 200 mL/hr over 60 Minutes Intravenous Every 24 hours 02/27/19 1659 02/28/19 1518   02/28/19 0430  ceFEPIme (MAXIPIME) 2 g in sodium chloride 0.9 % 100 mL IVPB  Status:  Discontinued     2 g 200 mL/hr over 30 Minutes Intravenous Every 12 hours 02/27/19 1655 03/01/19 1430   02/28/19 0200  azithromycin (ZITHROMAX) 500 mg in sodium chloride 0.9 % 250 mL IVPB  Status:  Discontinued     500 mg 250 mL/hr over 60 Minutes Intravenous Daily at bedtime 02/28/19 0124 03/01/19 1512   02/27/19 1704  vancomycin (VANCOCIN) 1000 MG powder    Note to Pharmacy:  [name]   : cabinet override      02/27/19 1704 02/28/19 0514   02/27/19 1704  vancomycin (VANCOCIN) 500 MG powder    Note to Pharmacy:  [name]   : cabinet override      02/27/19 1704 02/28/19 0514   02/27/19 1700  vancomycin (VANCOCIN) 1,500 mg in sodium chloride 0.9 % 500 mL IVPB     1,500 mg 250 mL/hr over 120 Minutes Intravenous  Once 02/27/19 1655 02/27/19 2040   02/27/19 1615  ceFEPIme (MAXIPIME) 2 g in sodium chloride 0.9 % 100 mL IVPB     2 g 200 mL/hr over 30 Minutes Intravenous  Once 02/27/19 1603 02/27/19 1652   02/27/19 1615  metroNIDAZOLE (FLAGYL) IVPB 500 mg     500 mg 100 mL/hr over 60 Minutes Intravenous  Once 02/27/19 1603 02/27/19 1806   02/27/19 1615  vancomycin (VANCOCIN) IVPB 1000 mg/200 mL premix  Status:  Discontinued     1,000 mg 200 mL/hr over 60 Minutes Intravenous  Once  02/27/19 1603 02/27/19 1655   02/27/19 1604  ceFEPIme (MAXIPIME) 2 g injection    Note to Pharmacy:  [name]   : cabinet override      02/27/19 1604 02/27/19 1632     Subjective: Feeling better. No specific complaints.  Objective: Vitals:   03/04/19 0900 03/04/19 1000 03/04/19 1100 03/04/19 1500  BP: (!) 142/75 139/66    Pulse: 89 78    Resp: 14 12    Temp:   97.9 F (36.6 C) 98.5 F (36.9 C)  TempSrc:   Axillary Oral  SpO2: 93% 92%    Weight:      Height:        Intake/Output Summary (Last 24 hours) at 03/04/2019 1704 Last data filed at 03/04/2019 1600 Gross per 24 hour  Intake 1660.22 ml  Output 1955 ml  Net -294.78 ml   Filed Weights   03/02/19 0400 03/03/19 0400 03/04/19 0500  Weight: 92.6 kg 91 kg 91.1 kg    Examination:  General exam: ppears calm and comfortable  Respiratory system: Clear to auscultation. Respiratory effort normal. Cardiovascular system: S1 & S2 heard, RRR. L IJ cath. Gastrointestinal system: bdomen is nondistended, soft and nontender.  Central nervous system: lert and oriented. No focal neurological deficits. Extremities: no LEE Skin: No rashes, lesions or ulcers Psychiatry: Judgement and insight appear normal. Mood & affect appropriate.     Data Reviewed: I have personally reviewed following labs and imaging studies  CBC: Recent Labs  Lab 02/28/19 0546  03/01/19 0349 03/02/19 0500 03/02/19 0508 03/03/19 0328 03/04/19 0420  WBC 10.1  --  14.2* 12.9*  --  12.8* 11.1*  NEUTROBS  --   --  10.8* 5.3  --  11.7* 9.4*  HGB 12.1*   < > 10.8* 10.1* 9.9* 10.6* 11.4*  HCT 39.7   < > 33.9* 31.0* 29.0* 33.4* 35.8*  MCV 91.1  --  86.5 84.0  --  85.6 84.8  PLT 86*  --  78* 70*  --  67* 81*   < > = values in this interval not displayed.   Basic Metabolic Panel: Recent Labs  Lab 02/28/19 0546  02/28/19 0857  03/01/19 0349 03/02/19 0500 03/02/19 0508 03/03/19 0140 03/03/19 0328 03/03/19 2353 03/04/19 0420  N  --    < > 138    < > 140 140 141 142 142 141 141  K  --    < > 5.0   < > 4.3 3.4* 3.4* 3.7 3.7 3.5 3.2*  CL  --   --  115*   < > 112* 108  --  111 110 108 109  CO2  --   --  13*   < > 15* 20*  --  22 22 22 23   GLUCOSE  --   --  156*   < > 185* 227*  --  151* 141* 192* 206*  BUN  --   --  35*   < > 44* 53*  --  58* 60* 56* 53*  CRETININE  --   --  1.92*   < > 2.37* 2.41*  --  2.03* 1.93* 1.82* 1.71*  CLCIUM  --   --  7.7*   < > 7.4* 7.5*  --  7.9* 8.1* 8.0* 8.2*  MG 0.9*  --  1.1*  --  1.9 1.9  --   --  2.4  --  2.1  PHOS 3.9  --   --   --  4.0 2.2*  --   --  3.5  --  2.9   < > = values in this interval not displayed.   GFR: Estimated Creatinine Clearance: 47.7 mL/min () (by C-G formula based on SCr of 1.71 mg/dL (H)). Liver Function Tests: Recent Labs  Lab 02/27/19 1534 03/02/19 0500 03/03/19 0328 03/04/19 0420  ST 20  --   --   --   LT 15  --   --   --   LKPHOS 41  --   --   --   BILITOT 2.0*  --   --   --   PROT 6.0*  --   --   --   LBUMIN 3.3* 2.1* 2.2* 2.3*   Recent Labs  Lab 02/27/19 1534  LIPSE 32   No results for input(s): MMONI in the last 168 hours.  Coagulation Profile: Recent Labs  Lab 02/28/19 0857 03/01/19 0349 03/02/19 0500 03/03/19 0328 03/04/19 0420  INR 5.5* 4.6* 3.0* 1.9* 1.6*   Cardiac Enzymes: Recent Labs  Lab 02/27/19 1841 02/28/19 0331 02/28/19 0546 02/28/19 1355 03/03/19 0328  TROPONINI 0.25* 0.24* 0.34* 0.37* 0.23*   BNP (last 3 results) No results for input(s): PROBNP in the last 8760 hours. Hb1C: No results for input(s): HGB1C in the last 72 hours. CBG: Recent Labs  Lab 03/03/19 2358 03/04/19 0429 03/04/19 0759 03/04/19 1148 03/04/19 1530  GLUCP 184* 180* 162* 154* 185*   Lipid Profile: No results for input(s): CHOL, HDL, LDLCLC, TRIG, CHOLHDL, LDLDIRECT in the last 72 hours. Thyroid Function Tests: No results for input(s): TSH, T4TOTL, FREET4, T3FREE, THYROIDB in the last 72 hours. nemia Panel: No results for  input(s): VITMINB12, FOLTE, FERRITIN, TIBC, IRON, RETICCTPCT in the last 72 hours. Sepsis Labs: Recent Labs  Lab 02/27/19 1559 02/27/19 1830 02/28/19 0331 02/28/19 0546 03/01/19 0349 03/03/19 0032  PROCLCITON  --   --  71.92 147.88 >150.00  --   LTICCIDVEN 3.9* 4.0* 2.8*  --   --  1.4    Recent Results (from the past 240 hour(s))  Blood culture (routine x 2)     Status: bnormal   Collection Time: 02/27/19  3:35 PM  Result Value Ref Range Status   Specimen Description   Final    BLOOD RIGHT NTECUBITL Performed at [facility], [place] [address], [place], [place] [address]    Special Requests   Final    BOTTLES DRWN EROBIC ND NEROBIC Blood Culture adequate volume Performed at [facility], [address] [place], [place] [address]    Culture  Setup Time   Final    GRM NEGTIVE RODS EROBIC BOTTLE ONLY CRITICL VLUE NOTED.  VLUE IS CONSISTENT WITH PREVIOUSLY REPORTED ND CLLED VLUE.    Culture ()  Final    CINETOBCTER CLCOCETICUS/BUMNNII COMPLEX SUSCEPTIBILITIES PERFORMED ON PREVIOUS CULTURE WITHIN THE LST 5 DYS. Performed at [facility], [place] [address], [place], [place] [address]    Report Status 03/02/2019 FINL  Final  Blood culture (routine x 2)     Status: bnormal   Collection Time: 02/27/19  4:00 PM  Result Value Ref Range Status   Specimen Description   Final    BLOOD RIGHT HND Performed at [facility], [address] [place], [place] [address]    Special Requests   Final    BOTTLES DRWN EROBIC ND NEROBIC Blood Culture adequate volume Performed at [facility], [address] [place], [place] [address]    Culture  Setup Time   Final    GRM NEGTIVE RODS EROBIC BOTTLE ONLY GRM POSITIVE COCCI CRITICL RESULT CLLED TO, RED BCK BY ND VERIFIED WITH: PHRMD  LOVE [id number] T 61 BY CM    Culture ()  Final    CINETOBCTER CLCOCETICUS/BUMNNII COMPLEX STPHYLOCOCCUS SPECIES  (COGULSE NEGTIVE) THE SIGNIFICNCE OF ISOLTING THIS ORGNISM FROM  SINGLE SET OF BLOOD CULTURES WHEN MULTIPLE SETS RE DRWN IS UNCERTIN. PLESE NOTIFY THE MICROBIOLOGY DEPRTMENT WITHIN ONE WEEK IF SPECITION ND SENSITIVITIES RE REQUIRED. Performed at [facility], [place] [address], [place], [place] [address]    Report Status 03/02/2019 FINL  Final   Organism ID, Bacteria CINETOBCTER CLCOCETICUS/BUMNNII COMPLEX  Final      Susceptibility   cinetobacter calcoaceticus/baumannii complex - MIC*    CEFTZIDIME 8 SENSITIVE Sensitive  CEFTRIXONE 16 INTERMEDITE Intermediate     CIPROFLOXCIN <=0.25 SENSITIVE Sensitive     GENTMICIN <=1 SENSITIVE Sensitive     IMIPENEM <=0.25 SENSITIVE Sensitive     PIP/TZO 8 SENSITIVE Sensitive     TRIMETH/SULF <=20 SENSITIVE Sensitive     CEFEPIME 8 SENSITIVE Sensitive     MPICILLIN/SULBCTM <=2 SENSITIVE Sensitive     * CINETOBCTER CLCOCETICUS/BUMNNII COMPLEX  Blood Culture ID Panel (Reflexed)     Status: bnormal   Collection Time: 02/27/19  4:00 PM  Result Value Ref Range Status   Enterococcus species NOT DETECTED NOT DETECTED Final   Listeria monocytogenes NOT DETECTED NOT DETECTED Final   Staphylococcus species DETECTED () NOT DETECTED Final    Comment: Methicillin (oxacillin) susceptible coagulase negative staphylococcus. Possible blood culture contaminant (unless isolated from more than one blood culture draw or clinical case suggests pathogenicity). No antibiotic treatment is indicated for blood  culture contaminants. CRITICL RESULT CLLED TO, RED BCK BY ND VERIFIED WITH: PHRMD  LOVE [id number] T 1884 BY CM    Staphylococcus aureus (BCID) NOT DETECTED NOT DETECTED Final   Methicillin resistance NOT DETECTED NOT DETECTED Final   Streptococcus species NOT DETECTED NOT DETECTED Final   Streptococcus agalactiae NOT DETECTED NOT DETECTED Final   Streptococcus pneumoniae NOT DETECTED NOT DETECTED Final    Streptococcus pyogenes NOT DETECTED NOT DETECTED Final   cinetobacter baumannii NOT DETECTED NOT DETECTED Final   Enterobacteriaceae species NOT DETECTED NOT DETECTED Final   Enterobacter cloacae complex NOT DETECTED NOT DETECTED Final   Escherichia coli NOT DETECTED NOT DETECTED Final   Klebsiella oxytoca NOT DETECTED NOT DETECTED Final   Klebsiella pneumoniae NOT DETECTED NOT DETECTED Final   Proteus species NOT DETECTED NOT DETECTED Final   Serratia marcescens NOT DETECTED NOT DETECTED Final   Haemophilus influenzae NOT DETECTED NOT DETECTED Final   Neisseria meningitidis NOT DETECTED NOT DETECTED Final   Pseudomonas aeruginosa NOT DETECTED NOT DETECTED Final   Candida albicans NOT DETECTED NOT DETECTED Final   Candida glabrata NOT DETECTED NOT DETECTED Final   Candida krusei NOT DETECTED NOT DETECTED Final   Candida parapsilosis NOT DETECTED NOT DETECTED Final   Candida tropicalis NOT DETECTED NOT DETECTED Final    Comment: Performed at [facility], [address], [place], [place] [address]  SRS Coronavirus 2 (Hosp order,Performed in [facility] lab via bbott ID)     Status: None   Collection Time: 02/27/19  4:15 PM  Result Value Ref Range Status   SRS Coronavirus 2 (bbott ID Now) NEGTIVE NEGTIVE Final    Comment: (NOTE) Interpretive Result Comment(s): COVID 19 Positive SRS CoV 2 target nucleic acids are DETECTED. The SRS CoV 2 RN is generally detectable in upper and lower respiratory specimens during the acute phase of infection.  Positive results are indicative of active infection with SRS CoV 2.  Clinical correlation with patient history and other diagnostic information is necessary to determine patient infection status.  Positive results do not rule out bacterial infection or coinfection with other viruses. The expected result is Negative. COVID 19 Negative SRS CoV 2 target nucleic acids are NOT DETECTED. The SRS CoV 2 RN is generally detectable in  upper and lower respiratory specimens during the acute phase of infection.  Negative results do not preclude SRS CoV 2 infection, do not rule out coinfections with other pathogens, and should not be used as the sole basis for treatment or other patient management decisions.  Negative results must be combined **Note De-Identified vi Obfusction** with clinicl  observtions, ptient history, nd epidemiologicl informtion. The expected result is Negtive. Invlid Presence or bsence of SARS CoV 2 nucleic cids cnnot be determined. Repet testing ws performed on the submitted specimen nd repeted Invlid results were obtined.  If cliniclly indicted, dditionl testing on  new specimen with n lternte test methodology [phone]) is dvised.  The SARS CoV 2 RNA is generlly detectble in upper nd lower respirtory specimens during the cute phse of infection. The expected result is Negtive. Fct Sheet for Ptients:  GolfingFmily.no Fct Sheet for [employer] Providers: https://www.hernndez-brewer.com/ This test is not yet pproved or clered by the [place] FDA nd hs been uthorized for detection nd/or dignosis of SARS CoV 2 by FDA under n Emergency Use Authoriztion (EUA).  This EUA will remin in effect (mening this test cn be used) for the durtion of the COVID19 d eclrtion under Section 564(b)(1) of the Act, 21 U.S.C. section 7090339707 3(b)(1), unless the uthoriztion is terminted or revoked sooner. Performed t [facility], [address], [place], [place] [address]   Urine culture     Sttus: None   Collection Time: 02/27/19  5:25 PM  Result Vlue Ref Rnge Sttus   Specimen Description   Finl    URINE, RANDOM Performed t [facility], [address] [place], [place] [address]    Specil Requests   Finl    NONE Performed t [facility], [address] [place], [place] [address]    Culture   Finl    NO GROWTH  Performed t [facility], [place] [address], [place], [place] [address]    Report Sttus 02/28/2019 FINAL  Finl  MRSA PCR Screening     Sttus: None   Collection Time: 02/28/19 12:32 AM  Result Vlue Ref Rnge Sttus   MRSA by PCR NEGATIVE NEGATIVE Finl    Comment:        The GeneXpert MRSA Assy (FDA pproved for NASAL specimens only), is one component of  comprehensive MRSA coloniztion surveillnce progrm. It is not intended to dignose MRSA infection nor to guide or monitor tretment for MRSA infections. Performed t [facility], [place] [address] [place], [place] [address]   Novel Coronvirus, NAA (hospitl order; send-out to ref lb)     Sttus: None   Collection Time: 02/28/19  1:10 AM  Result Vlue Ref Rnge Sttus   SARS-CoV-2, NAA NOT DETECTED NOT DETECTED Finl    Comment: (NOTE) This test ws developed nd its performnce chrcteristics determined by [employer] nd [employer]. This test hs not been FDA clered or pproved. This test hs been uthorized by FDA under n Emergency Use Authoriztion (EUA). This test is only uthorized for the durtion of time the declrtion tht circumstnces exist justifying the uthoriztion of the emergency use of in vitro dignostic tests for detection of SARS-CoV-2 virus nd/or dignosis of COVID-19 infection under section 564(b)(1) of the Act, 21 U.S.C. 532DJM-4(Q)(6), unless the uthoriztion is terminted or revoked sooner. When dignostic testing is negtive, the possibility of  flse negtive result should be considered in the context of  ptient's recent exposures nd the presence of clinicl signs nd symptoms consistent with COVID-19. An individul without symptoms of COVID-19 nd who is not shedding SARS-CoV-2 virus would expect to hve  negtive (not detected) result in this ssy. Performed  At: [facility] [address] [place], [place] [id number] Rush Frmer MD LN:[id number]     [id number]  Source NASOPHARYNGEAL  Final    Comment: Performed at [facility], [place] [address] [place], [place] [address]         Radiology Studies: No results found.      Scheduled Meds: . atorvastatin  10 mg Oral q1800  . chlorhexidine  15 mL Mouth Rinse BID  . Chlorhexidine Gluconate Cloth  6 each Topical Q0600  . Chlorhexidine Gluconate Cloth  6 each Topical Daily  . famotidine  20 mg Oral Daily  . feeding supplement (ENSURE ENLIVE)  237 mL Oral BID BM  . folic acid  1 mg Oral Daily  . hydrocortisone sod succinate (SOLU-CORTEF) inj  50 mg Intravenous Q12H  . insulin aspart  0-20 Units Subcutaneous Q4H  . labetalol  100 mg Oral BID  . mouth rinse  15 mL Mouth Rinse q12n4p  . NIFEdipine  60 mg Oral Daily  . sodium chloride flush  10-40 mL Intracatheter Q12H  . sulfamethoxazole-trimethoprim  1 tablet Oral Q M,W,F  . tacrolimus  2 mg Oral BID  . tamsulosin  0.4 mg Oral Daily  . thiamine  250 mg Oral Daily  . warfarin  5 mg Oral ONCE-1800  . Warfarin - Pharmacist Dosing Inpatient   Does not apply q1800   Continuous Infusions: . sodium chloride Stopped (03/03/19 1054)  . ampicillin-sulbactam (UNASYN) IV 3 g (03/04/19 1410)     LOS: 5 days    Time spent: over 30 min    Fayrene Helper, MD [facility] Pager AMION  If 7PM-7AM, please contact night-coverage [email or web address] Password [facility] 03/04/2019, 5:04 PM

## 2019-03-04 NOTE — Progress Notes (Signed)
  Speech Language Pathology Treatment: Dysphagia  Patient Details Name: Rannie Craney MRN: [id number] DOB: 03/26/1951 Today's Date: 03/04/2019 Time: 2330-0762 SLP Time Calculation (min) (ACUTE ONLY): 9 min  Assessment / Plan / Recommendation Clinical Impression  Pt had consumed most of what he wanted off his breakfast tray upon SLP arrival, denying any coughing of overt difficulty with this meal or any consumption since initial evaluation. SLP observed no trouble other than mildly prolonged mastication, which he self-managed with Mod I using extra time and liquid washes. He believes his voice is nearing baseline. Recommend regular diet and thin liquids. Education was provided about general aspiration precautions. Will sign off for now - please reorder if acute concerns arise.   HPI HPI: Ellis Mehaffey is a 68 y.o. male who has a PMH including but not limited to ESRD s/p renal transplant, DM, HTN, HLD, DVT on coumadin.  He presented to [facility] 6/3 with abdominal pain.  Had diarrhea that started the night prior after he had cut his grass.  Did report mild SOB and mild non productive cough.  No fevers/chills/sweats. In ED, he was found to be hypoxic with slightly increased WOB.  He was placed on NRB to maintain SpO2 in 90s.  CXR demonstrated LLL opacity.  CT A/P demonstrated extensive PNA bilaterally L > R.  Initial rapid COVID negative. Due to increased O2 needs and concern for deterioration, he was transferred to [facility] for further evaluation and management.  He was ultimately intubated for two days.  Most recent chest xray was showing consolidation in the left mid and lower lung as well as the right lung base.  In addition, small bilateral pleural effusions were seen.        SLP Plan  All goals met       Recommendations  Diet recommendations: Regular;Thin liquid Liquids provided via: Cup;Straw Medication Administration: Whole meds with liquid Supervision: Patient able to self feed;Intermittent  supervision to cue for compensatory strategies Compensations: Slow rate;Small sips/bites Postural Changes and/or Swallow Maneuvers: Seated upright 90 degrees                Oral Care Recommendations: Oral care BID Follow up Recommendations: None SLP Visit Diagnosis: Dysphagia, unspecified (R13.10) Plan: All goals met       GO                Venita Sheffield Mubarak Bevens 03/04/2019, 9:20 AM  Pollyann Glen, M.A. [place] Acute Environmental education officer [phone] Office [phone]

## 2019-03-05 ENCOUNTER — Encounter (HOSPITAL_COMMUNITY): Payer: Self-pay | Admitting: Physical Medicine and Rehabilitation

## 2019-03-05 ENCOUNTER — Inpatient Hospital Stay (HOSPITAL_COMMUNITY): Payer: Medicare HMO

## 2019-03-05 DIAGNOSIS — R5381 Other malaise: Secondary | ICD-10-CM

## 2019-03-05 LAB — CBC WITH DIFFERENTIAL/PLATELET
Abs Immature Granulocytes: 0.69 10*3/uL — ABNORMAL HIGH (ref 0.00–0.07)
Basophils Absolute: 0 10*3/uL (ref 0.0–0.1)
Basophils Relative: 0 %
Eosinophils Absolute: 0 10*3/uL (ref 0.0–0.5)
Eosinophils Relative: 0 %
HCT: 32.7 % — ABNORMAL LOW (ref 39.0–52.0)
Hemoglobin: 10.6 g/dL — ABNORMAL LOW (ref 13.0–17.0)
Immature Granulocytes: 7 %
Lymphocytes Relative: 3 %
Lymphs Abs: 0.3 10*3/uL — ABNORMAL LOW (ref 0.7–4.0)
MCH: 27.6 pg (ref 26.0–34.0)
MCHC: 32.4 g/dL (ref 30.0–36.0)
MCV: 85.2 fL (ref 80.0–100.0)
Monocytes Absolute: 0.9 10*3/uL (ref 0.1–1.0)
Monocytes Relative: 9 %
Neutro Abs: 8.3 10*3/uL — ABNORMAL HIGH (ref 1.7–7.7)
Neutrophils Relative %: 81 %
Platelets: 85 10*3/uL — ABNORMAL LOW (ref 150–400)
RBC: 3.84 MIL/uL — ABNORMAL LOW (ref 4.22–5.81)
RDW: 15.9 % — ABNORMAL HIGH (ref 11.5–15.5)
WBC: 10.2 10*3/uL (ref 4.0–10.5)
nRBC: 0.2 % (ref 0.0–0.2)

## 2019-03-05 LAB — GLUCOSE, CAPILLARY
Glucose-Capillary: 150 mg/dL — ABNORMAL HIGH (ref 70–99)
Glucose-Capillary: 142 mg/dL — ABNORMAL HIGH (ref 70–99)
Glucose-Capillary: 267 mg/dL — ABNORMAL HIGH (ref 70–99)
Glucose-Capillary: 195 mg/dL — ABNORMAL HIGH (ref 70–99)
Glucose-Capillary: 205 mg/dL — ABNORMAL HIGH (ref 70–99)

## 2019-03-05 LAB — RENAL FUNCTION PANEL
Albumin: 2.3 g/dL — ABNORMAL LOW (ref 3.5–5.0)
Anion gap: 11 (ref 5–15)
BUN: 38 mg/dL — ABNORMAL HIGH (ref 8–23)
CO2: 22 mmol/L (ref 22–32)
Calcium: 8.3 mg/dL — ABNORMAL LOW (ref 8.9–10.3)
Chloride: 107 mmol/L (ref 98–111)
Creatinine, Ser: 1.29 mg/dL — ABNORMAL HIGH (ref 0.61–1.24)
GFR calc Af Amer: >60 mL/min (ref >60)
GFR calc non Af Amer: 57 mL/min — ABNORMAL LOW (ref >60)
Glucose, Bld: 171 mg/dL — ABNORMAL HIGH (ref 70–99)
Phosphorus: 2.2 mg/dL — ABNORMAL LOW (ref 2.5–4.6)
Potassium: 3 mmol/L — ABNORMAL LOW (ref 3.5–5.1)
Sodium: 140 mmol/L (ref 135–145)

## 2019-03-05 LAB — LIPID PANEL
Cholesterol: 108 mg/dL (ref 0–200)
HDL: 28 mg/dL — ABNORMAL LOW (ref >40)
LDL Cholesterol: 65 mg/dL (ref 0–99)
Total CHOL/HDL Ratio: 3.9 RATIO
Triglycerides: 75 mg/dL (ref <150)
VLDL: 15 mg/dL (ref 0–40)

## 2019-03-05 LAB — PROTIME-INR
INR: 2 — ABNORMAL HIGH (ref 0.8–1.2)
Prothrombin Time: 22.6 seconds — ABNORMAL HIGH (ref 11.4–15.2)

## 2019-03-05 LAB — MAGNESIUM: Magnesium: 1.8 mg/dL (ref 1.7–2.4)

## 2019-03-05 MED ORDER — WARFARIN SODIUM 2.5 MG PO TABS
2.5000 mg | ORAL_TABLET | Freq: Once | ORAL | Status: AC
  Administered 2019-03-05: 2.5 mg via ORAL
  Filled 2019-03-05: qty 1

## 2019-03-05 MED ORDER — SODIUM CHLORIDE 0.9 % IV SOLN
3.0000 g | Freq: Four times a day (QID) | INTRAVENOUS | Status: DC
  Administered 2019-03-05 (×2): 3 g via INTRAVENOUS
  Filled 2019-03-05 (×4): qty 3

## 2019-03-05 MED ORDER — PREDNISONE 20 MG PO TABS
40.0000 mg | ORAL_TABLET | Freq: Every day | ORAL | Status: DC
  Administered 2019-03-06 – 2019-03-08 (×3): 40 mg via ORAL
  Filled 2019-03-05 (×3): qty 2

## 2019-03-05 MED ORDER — MYCOPHENOLATE SODIUM 180 MG PO TBEC
360.0000 mg | DELAYED_RELEASE_TABLET | Freq: Two times a day (BID) | ORAL | Status: DC
  Administered 2019-03-05 – 2019-03-08 (×6): 360 mg via ORAL
  Filled 2019-03-05 (×7): qty 2

## 2019-03-05 MED ORDER — POTASSIUM CHLORIDE CRYS ER 20 MEQ PO TBCR
40.0000 meq | EXTENDED_RELEASE_TABLET | ORAL | Status: AC
  Administered 2019-03-05 (×2): 40 meq via ORAL
  Filled 2019-03-05 (×2): qty 2

## 2019-03-05 NOTE — Progress Notes (Signed)
PROGRESS NOTE    Ryan [facility]  XLK:[id number] DOB: [birth year] DOA: 02/27/2019 PCP: [facility]   Brief Narrative:  Ryan Rice is [facility] 68 y.o. male who has [facility] PMH including but not limited to ESRD s/p renal transplant, DM, HTN, HLD, DVT on coumadin.  [facility] 6[facility] with abdominal pain.  Had diarrhea that started the night prior after he had cut his grass.  Did report mild SOB and mild non productive cough.  No fevers/chills/sweats.  [facility] ED, he was found to be hypoxic with slightly increased WOB.  He was placed on NRB to maintain SpO2 Rice 90s.  CXR demonstrated LLL opacity.  CT [facility]/P demonstrated extensive PNA bilaterally L > R.  Initial rapid COVID negative.  Due to increased O2 needs and concern for deterioration, he was transferred to [facility] for further evaluation and management.  Admitted to ICU.  Transferred to [facility] on 6/8.  Pt admitted by ICU for multifocal pneumonia.  He required mechanical ventilation and vasopressors for shock.  Now improved.  Pt transferred to [facility] 6/8.  Completed course of abx.  Pending CIR.  Assessment & Plan:   Active Problems:   Sepsis ([facility])   Acute renal failure (ARF) (HCC)   Community acquired pneumonia of left lower lobe of lung ([facility])   Kidney transplant recipient  Acinetobacter Bacteremia  Multifocal Pneumonia  Vent Dependent Resp Failure:  Immunocompromised with hx renal transplant Blood cx 6/3 with acinetobacter (1/2 with coag negative staph, suspect contaminant) Cefepime 6/3 - 6/5 Unasyn 6/6 - 6/9 Currently with 7 days of cefepime and unasyn, will d/c abx at this time Blood cx 6/8 NGTD x 1 Afebrile, imrpoving wbc Taper stress dose steroids -> to 40 mg prednisone tomorrow  Acute on CKD  Hx of Renal transplant: per renal note, creatinine 1.44 prior to admission.  Baseline ~1.7 per renal notes [facility] few months ago. Peaked at 2.41 Improving Appreciate renal recs Continue prograf and stress dose steroids Myfortic resumed  Acute  Encephalopathy: improved, likely delirium 2/2 hospitalization  Hx DVT  Supratherapeutic INR on presentation: s/p vitamin K and FFP at presentation due to supratherapeutic INR.   Warfarin per pharmacy  Elevated Troponin: peaked at 0.34. thought 2/2 demand ischemia with sepsis Rice setting of aki on CKD.  Echo with mildly reduced EF (was normal on 3/12).  No comment on WMA.  Had stress test on 12/06/2018 without evidence of ischemia. 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  He's currently on steroids.  CTM on steroids.  Follow uric acid.  L ankle plain film without acute abnormality.  DVT prophylaxis: warfarin Code Status: full  Family Communication: none at bedside Disposition Plan: pending CIR   Consultants:   Nephrology  PCCM   Procedures:  Echo IMPRESSIONS    1. The left ventricle has mildly reduced systolic function, with an ejection fraction of 45-50%. The cavity size was normal. There is  mildly increased left ventricular wall thickness. Left ventricular diastolic function could not be evaluated secondary  to atrial fibrillation.  2. The right ventricle has normal systolic function. The cavity was normal. There is no increase Rice right ventricular wall thickness.  3. Left atrial size was moderately dilated.  4. Right atrial size was moderately dilated.  5. The aortic valve is abnormal. Moderate thickening of the  aortic valve. Moderate calcification of the aortic valve. Mild stenosis of the aortic valve. Moderate aortic annular calcification noted.  6. There is moderate mitral annular calcification present.  7. The interatrial septum was not assessed.  6/4 intubation 6/4 central line -> removed 6/9 6/4 art line  Antimicrobials: Anti-infectives (From admission, onward)   Start     Dose/Rate Route Frequency Ordered Stop   03/05/19 1152  Ampicillin-Sulbactam (UNASYN) 3 g Rice sodium chloride 0.9 % 100 mL IVPB     3 g 200 mL/hr over 30 Minutes Intravenous Every 6 hours 03/05/19 1009     03/02/19 1400  Ampicillin-Sulbactam (UNASYN) 3 g Rice sodium chloride 0.9 % 100 mL IVPB  Status:  Discontinued     3 g 200 mL/hr over 30 Minutes Intravenous Every 8 hours 03/02/19 1041 03/05/19 1009   03/01/19 1500  imipenem-cilastatin (PRIMAXIN) 500 mg Rice sodium chloride 0.9 % 100 mL IVPB  Status:  Discontinued     500 mg 200 mL/hr over 30 Minutes Intravenous Every 8 hours 03/01/19 1430 03/02/19 1041   03/01/19 1000  sulfamethoxazole-trimethoprim (BACTRIM) 400-80 MG per tablet 1 tablet     1 tablet Oral Every M-W-F 02/28/19 0034     02/28/19 1700  vancomycin (VANCOCIN) IVPB 1000 mg/200 mL premix  Status:  Discontinued     1,000 mg 200 mL/hr over 60 Minutes Intravenous Every 24 hours 02/27/19 1659 02/28/19 1518   02/28/19 0430  ceFEPIme (MAXIPIME) 2 g Rice sodium chloride 0.9 % 100 mL IVPB  Status:  Discontinued     2 g 200 mL/hr over 30 Minutes Intravenous Every 12 hours 02/27/19 1655 03/01/19 1430   02/28/19 0200  azithromycin (ZITHROMAX) 500 mg Rice sodium chloride 0.9 % 250 mL IVPB  Status:  Discontinued     500 mg 250 mL/hr over 60 Minutes Intravenous Daily at bedtime 02/28/19 0124 03/01/19 1512   02/27/19 1704  vancomycin (VANCOCIN) 1000 MG powder    Note to Pharmacy:  [facility]   : cabinet override      02/27/19 1704 02/28/19 0514   02/27/19 1704  vancomycin (VANCOCIN) 500 MG powder    Note to Pharmacy:   [facility]   : cabinet override      02/27/19 1704 02/28/19 0514   02/27/19 1700  vancomycin (VANCOCIN) 1,500 mg Rice sodium chloride 0.9 % 500 mL IVPB     1,500 mg 250 mL/hr over 120 Minutes Intravenous  Once 02/27/19 1655 02/27/19 2040   02/27/19 1615  ceFEPIme (MAXIPIME) 2 g Rice sodium chloride 0.9 % 100 mL IVPB     2 g 200 mL/hr over 30 Minutes Intravenous  Once 02/27/19 1603 02/27/19 1652   02/27/19 1615  metroNIDAZOLE (FLAGYL) IVPB 500 mg     500 mg 100 mL/hr over 60 Minutes Intravenous  Once 02/27/19 1603 02/27/19 1806   02/27/19 1615  vancomycin (VANCOCIN) IVPB 1000 mg/200 mL premix  Status:  Discontinued     1,000 mg 200 mL/hr over 60 Minutes Intravenous  Once 02/27/19 1603 02/27/19 1655   02/27/19 1604  ceFEPIme (MAXIPIME) 2 g  injection    Note to Pharmacy:  [name]   : cabinet override      02/27/19 1604 02/27/19 1632     Subjective: C/o pain at L ankle.  Feels like gout.  Objective: Vitals:   03/04/19 2312 03/05/19 0520 03/05/19 0901 03/05/19 1437  BP: (!) 151/78 (!) 152/76 128/69 135/72  Pulse: 79 87 88 76  Resp:   16 20  Temp: 98.8 F (37.1 C) 98.9 F (37.2 C) 98.7 F (37.1 C) 98.3 F (36.8 C)  TempSrc: Oral     SpO2: 96% 93% 94% 99%  Weight:      Height:        Intake/Output Summary (Last 24 hours) at 03/05/2019 1901 Last data filed at 03/05/2019 1825 Gross per 24 hour  Intake 450 ml  Output 425 ml  Net 25 ml   Filed Weights   03/02/19 0400 03/03/19 0400 03/04/19 0500  Weight: 92.6 kg 91 kg 91.1 kg    Examination:  General: No acute distress. Cardiovascular: Heart sounds show Braxtyn Dorff regular rate, and rhythm.  Lungs: Clear to auscultation bilaterally Abdomen: Soft, nontender, nondistended  Neurological: Alert and oriented 3. Moves all extremities 4 . Cranial nerves II through XII grossly intact. Skin: Warm and dry. No rashes or lesions. Extremities: L ankle pain, not exquisitely ttp as i'd generally expect with gout, but he feels this c/w  prior flares Psychiatric: Mood and affect are normal. Insight and judgment are appropriate.    Data Reviewed: I have personally reviewed following labs and imaging studies  CBC: Recent Labs  Lab 03/01/19 0349 03/02/19 0500 03/02/19 0508 03/03/19 0328 03/04/19 0420 03/05/19 0353  WBC 14.2* 12.9*  --  12.8* 11.1* 10.2  NEUTROABS 10.8* 5.3  --  11.7* 9.4* 8.3*  HGB 10.8* 10.1* 9.9* 10.6* 11.4* 10.6*  HCT 33.9* 31.0* 29.0* 33.4* 35.8* 32.7*  MCV 86.5 84.0  --  85.6 84.8 85.2  PLT 78* 70*  --  67* 81* 85*   Basic Metabolic Panel: Recent Labs  Lab 03/01/19 0349 03/02/19 0500  03/03/19 0140 03/03/19 0328 03/03/19 2353 03/04/19 0420 03/05/19 0353  NA 140 140   < > 142 142 141 141 140  K 4.3 3.4*   < > 3.7 3.7 3.5 3.2* 3.0*  CL 112* 108  --  111 110 108 109 107  CO2 15* 20*  --  22 22 22 23 22   GLUCOSE 185* 227*  --  151* 141* 192* 206* 171*  BUN 44* 53*  --  58* 60* 56* 53* 38*  CREATININE 2.37* 2.41*  --  2.03* 1.93* 1.82* 1.71* 1.29*  CALCIUM 7.4* 7.5*  --  7.9* 8.1* 8.0* 8.2* 8.3*  MG 1.9 1.9  --   --  2.4  --  2.1 1.8  PHOS 4.0 2.2*  --   --  3.5  --  2.9 2.2*   < > = values Rice this interval not displayed.   GFR: Estimated Creatinine Clearance: 63.3 mL/min (Ransom Nickson) (by C-G formula based on SCr of 1.29 mg/dL (H)). Liver Function Tests: Recent Labs  Lab 02/27/19 1534 03/02/19 0500 03/03/19 0328 03/04/19 0420 03/05/19 0353  AST 20  --   --   --   --   ALT 15  --   --   --   --   ALKPHOS 41  --   --   --   --   BILITOT 2.0*  --   --   --   --  PROT 6.0*  --   --   --   --   ALBUMIN 3.3* 2.1* 2.2* 2.3* 2.3*   Recent Labs  Lab 02/27/19 1534  LIPASE 32   No results for input(s): AMMONIA Rice the last 168 hours. Coagulation Profile: Recent Labs  Lab 03/01/19 0349 03/02/19 0500 03/03/19 0328 03/04/19 0420 03/05/19 0353  INR 4.6* 3.0* 1.9* 1.6* 2.0*   Cardiac Enzymes: Recent Labs  Lab 02/27/19 1841 02/28/19 0331 02/28/19 0546 02/28/19 1355 03/03/19  0328  TROPONINI 0.25* 0.24* 0.34* 0.37* 0.23*   BNP (last 3 results) No results for input(s): PROBNP Rice the last 8760 hours. HbA1C: Recent Labs    03/04/19 0420  HGBA1C 6.2*   CBG: Recent Labs  Lab 03/04/19 2316 03/05/19 0527 03/05/19 0743 03/05/19 1235 03/05/19 1544  GLUCAP 140* 150* 142* 267* 195*   Lipid Profile: Recent Labs    03/05/19 0353  CHOL 108  HDL 28*  LDLCALC 65  TRIG 75  CHOLHDL 3.9   Thyroid Function Tests: No results for input(s): TSH, T4TOTAL, FREET4, T3FREE, THYROIDAB Rice the last 72 hours. Anemia Panel: No results for input(s): VITAMINB12, FOLATE, FERRITIN, TIBC, IRON, RETICCTPCT Rice the last 72 hours. Sepsis Labs: Recent Labs  Lab 02/27/19 1559 02/27/19 1830 02/28/19 0331 02/28/19 0546 03/01/19 0349 03/03/19 0032  PROCALCITON  --   --  71.92 147.88 >150.00  --   LATICACIDVEN 3.9* 4.0* 2.8*  --   --  1.4    Recent Results (from the past 240 hour(s))  Blood culture (routine x 2)     Status: Abnormal   Collection Time: 02/27/19  3:35 PM  Result Value Ref Range Status   Specimen Description   Final    BLOOD RIGHT ANTECUBITAL Performed at [facility], [place] [address], [place], [place] [address]    Special Requests   Final    BOTTLES DRAWN AEROBIC AND ANAEROBIC Blood Culture adequate volume Performed at [facility], [address] [place], [place] [address]    Culture  Setup Time   Final    GRAM NEGATIVE RODS AEROBIC BOTTLE ONLY CRITICAL VALUE NOTED.  VALUE IS CONSISTENT WITH PREVIOUSLY REPORTED AND CALLED VALUE.    Culture (Lesleigh Hughson)  Final    ACINETOBACTER CALCOACETICUS/BAUMANNII COMPLEX SUSCEPTIBILITIES PERFORMED ON PREVIOUS CULTURE WITHIN THE LAST 5 DAYS. Performed at [facility], [place] [address], [place], [place] [address]    Report Status 03/02/2019 FINAL  Final  Blood culture (routine x 2)     Status: Abnormal   Collection Time: 02/27/19  4:00 PM  Result Value Ref Range Status   Specimen Description    Final    BLOOD RIGHT HAND Performed at [facility], [address] [place], [place] [address]    Special Requests   Final    BOTTLES DRAWN AEROBIC AND ANAEROBIC Blood Culture adequate volume Performed at [facility], [address] [place], [place] [address]    Culture  Setup Time   Final    GRAM NEGATIVE RODS AEROBIC BOTTLE ONLY GRAM POSITIVE COCCI CRITICAL RESULT CALLED TO, READ BACK BY AND VERIFIED WITH: PHARMD Steffany Schoenfelder LOVE [id number] AT 50 BY CM    Culture (Audreanna Torrisi)  Final    ACINETOBACTER CALCOACETICUS/BAUMANNII COMPLEX STAPHYLOCOCCUS SPECIES (COAGULASE NEGATIVE) THE SIGNIFICANCE OF ISOLATING THIS ORGANISM FROM Terrea Bruster SINGLE SET OF BLOOD CULTURES WHEN MULTIPLE SETS ARE DRAWN IS UNCERTAIN. PLEASE NOTIFY THE MICROBIOLOGY DEPARTMENT WITHIN ONE WEEK IF SPECIATION AND SENSITIVITIES  ARE REQUIRED. Performed at [facility], [place] [address], [place], [place] [address]    Report Status 03/02/2019 FINAL  Final   Organism ID, Bacteria ACINETOBACTER CALCOACETICUS/BAUMANNII COMPLEX  Final      Susceptibility   Acinetobacter calcoaceticus/baumannii complex - MIC*    CEFTAZIDIME 8 SENSITIVE Sensitive     CEFTRIAXONE 16 INTERMEDIATE Intermediate     CIPROFLOXACIN <=0.25 SENSITIVE Sensitive     GENTAMICIN <=1 SENSITIVE Sensitive     IMIPENEM <=0.25 SENSITIVE Sensitive     PIP/TAZO 8 SENSITIVE Sensitive     TRIMETH/SULFA <=20 SENSITIVE Sensitive     CEFEPIME 8 SENSITIVE Sensitive     AMPICILLIN/SULBACTAM <=2 SENSITIVE Sensitive     * ACINETOBACTER CALCOACETICUS/BAUMANNII COMPLEX  Blood Culture ID Panel (Reflexed)     Status: Abnormal   Collection Time: 02/27/19  4:00 PM  Result Value Ref Range Status   Enterococcus species NOT DETECTED NOT DETECTED Final   Listeria monocytogenes NOT DETECTED NOT DETECTED Final   Staphylococcus species DETECTED (Symphany Fleissner) NOT DETECTED Final    Comment: Methicillin (oxacillin) susceptible coagulase negative staphylococcus. Possible blood culture  contaminant (unless isolated from more than one blood culture draw or clinical case suggests pathogenicity). No antibiotic treatment is indicated for blood  culture contaminants. CRITICAL RESULT CALLED TO, READ BACK BY AND VERIFIED WITH: PHARMD Jacynda Brunke LOVE [id number] AT 8185 BY CM    Staphylococcus aureus (BCID) NOT DETECTED NOT DETECTED Final   Methicillin resistance NOT DETECTED NOT DETECTED Final   Streptococcus species NOT DETECTED NOT DETECTED Final   Streptococcus agalactiae NOT DETECTED NOT DETECTED Final   Streptococcus pneumoniae NOT DETECTED NOT DETECTED Final   Streptococcus pyogenes NOT DETECTED NOT DETECTED Final   Acinetobacter baumannii NOT DETECTED NOT DETECTED Final   Enterobacteriaceae species NOT DETECTED NOT DETECTED Final   Enterobacter cloacae complex NOT DETECTED NOT DETECTED Final   Escherichia coli NOT DETECTED NOT DETECTED Final   Klebsiella oxytoca NOT DETECTED NOT DETECTED Final   Klebsiella pneumoniae NOT DETECTED NOT DETECTED Final   Proteus species NOT DETECTED NOT DETECTED Final   Serratia marcescens NOT DETECTED NOT DETECTED Final   Haemophilus influenzae NOT DETECTED NOT DETECTED Final   Neisseria meningitidis NOT DETECTED NOT DETECTED Final   Pseudomonas aeruginosa NOT DETECTED NOT DETECTED Final   Candida albicans NOT DETECTED NOT DETECTED Final   Candida glabrata NOT DETECTED NOT DETECTED Final   Candida krusei NOT DETECTED NOT DETECTED Final   Candida parapsilosis NOT DETECTED NOT DETECTED Final   Candida tropicalis NOT DETECTED NOT DETECTED Final    Comment: Performed at [facility], [address], [place], [place] [address]  SARS Coronavirus 2 (Hosp order,Performed [facility] via [employer] ID)     Status: None   Collection Time: 02/27/19  4:15 PM  Result Value Ref Range Status   SARS Coronavirus 2 ([employer] ID Now) NEGATIVE NEGATIVE Final    Comment: (NOTE) Interpretive Result Comment(s): COVID 19 Positive SARS CoV 2 target nucleic acids  are DETECTED. The SARS CoV 2 RNA is generally detectable Rice upper and lower respiratory specimens during the acute phase of infection.  Positive results are indicative of active infection with SARS CoV 2.  Clinical correlation with patient history and other diagnostic information is necessary to determine patient infection status.  Positive results do not rule out bacterial infection or coinfection with other viruses. The expected result is Negative. COVID 19 Negative SARS CoV 2 target nucleic acids are NOT DETECTED. The SARS  CoV 2 RNA is generally detectable Rice upper and lower respiratory specimens during the acute phase of infection.  Negative results do not preclude SARS CoV 2 infection, do not rule out coinfections with other pathogens, and should not be used as the sole basis for treatment or other patient management decisions.  Negative results must be combined with clinical  observations, patient history, and epidemiological information. The expected result is Negative. Invalid Presence or absence of SARS CoV 2 nucleic acids cannot be determined. Repeat testing was performed on the submitted specimen and repeated Invalid results were obtained.  If clinically indicated, additional testing on Nekhi Liwanag new specimen with an alternate test methodology [id number]) is advised.  The SARS CoV 2 RNA is generally detectable Rice upper and lower respiratory specimens during the acute phase of infection. The expected result is Negative. Fact Sheet for Patients:  GolfingFamily.no Fact Sheet for Healthcare Providers: https://www.hernandez-brewer.com/ This test is not yet approved or cleared by the [place] FDA and has been authorized for detection and/or diagnosis of SARS CoV 2 by FDA under an Emergency Use Authorization (EUA).  This EUA will remain Rice effect (meaning this test can be used) for the duration of the COVID19 d eclaration under Section 564(b)(1) of  the Act, 21 U.S.C. section 5735037994 3(b)(1), unless the authorization is terminated or revoked sooner. Performed at [facility], [address], [place], [place] [address]   Urine culture     Status: None   Collection Time: 02/27/19  5:25 PM  Result Value Ref Range Status   Specimen Description   Final    URINE, RANDOM Performed at [facility], [address] [place], [place] [address]    Special Requests   Final    NONE Performed at [facility], [address] [place], [place] [address]    Culture   Final    NO GROWTH Performed at [facility], [place] [address], [place], [place] [address]    Report Status 02/28/2019 FINAL  Final  MRSA PCR Screening     Status: None   Collection Time: 02/28/19 12:32 AM  Result Value Ref Range Status   MRSA by PCR NEGATIVE NEGATIVE Final    Comment:        The GeneXpert MRSA Assay (FDA approved for NASAL specimens only), is one component of Sibyl Mikula comprehensive MRSA colonization surveillance program. It is not intended to diagnose MRSA infection nor to guide or monitor treatment for MRSA infections. Performed at [facility], [place] [address] [place], [place] [address]   Novel Coronavirus, NAA (Rice order; send-out to ref lab)     Status: None   Collection Time: 02/28/19  1:10 AM  Result Value Ref Range Status   SARS-CoV-2, NAA NOT DETECTED NOT DETECTED Final    Comment: (NOTE) This test was developed and its performance characteristics determined by [employer]. This test has not been FDA cleared or approved. This test has been authorized by FDA under an Emergency Use Authorization (EUA). This test is only authorized for the duration of time the declaration that circumstances exist justifying the authorization of the emergency use of Rice vitro diagnostic tests for detection of SARS-CoV-2 virus and/or diagnosis of COVID-19 infection under section 564(b)(1) of the Act, 21 U.S.C.  101BPZ-0(C)(5), unless the authorization is terminated or revoked sooner. When diagnostic testing is negative, the possibility of Roslynn Holte false negative result should be considered Rice the context of Geraldyn Shain patient's  recent exposures and the presence of clinical signs and symptoms consistent with COVID-19. An individual without symptoms of COVID-19 and who is not shedding SARS-CoV-2 virus would expect to have Lujean Ebright negative (not detected) result Rice this assay. Performed  At: [facility] [address] [place], [place] [id number] Rush Farmer MD ZD:[id number]    [place]  Final    Comment: Performed at [facility], [place] [address] [place], [place] [address]  Culture, blood (routine x 2)     Status: None (Preliminary result)   Collection Time: 03/04/19 10:35 AM  Result Value Ref Range Status   Specimen Description BLOOD RIGHT HAND  Final   Special Requests   Final    BOTTLES DRAWN AEROBIC ONLY Blood Culture adequate volume   Culture   Final    NO GROWTH 1 DAY Performed at [facility], [place] [address], [place], [place] [address]    Report Status PENDING  Incomplete  Culture, blood (routine x 2)     Status: None (Preliminary result)   Collection Time: 03/04/19 10:45 AM  Result Value Ref Range Status   Specimen Description BLOOD RIGHT HAND  Final   Special Requests   Final    BOTTLES DRAWN AEROBIC ONLY Blood Culture adequate volume   Culture   Final    NO GROWTH 1 DAY Performed at [facility], [place] [address] [place], [place] [address]    Report Status PENDING  Incomplete         Radiology Studies: Dg Ankle Complete Left  Result Date: 03/05/2019 CLINICAL DATA:  Soft tissue swelling and pain EXAM: LEFT ANKLE COMPLETE - 3+ VIEW COMPARISON:  None. FINDINGS: Frontal, oblique, and lateral views were obtained. There is no fracture or joint effusion. There is osteoarthritic change medially Rice the ankle joint. No erosive changes evident. The  ankle mortise appears intact. There is widespread arterial vascular calcification. IMPRESSION: Osteoarthritic change medially. No fracture. Ankle mortise appears intact. There is widespread arterial vascular calcification, likely due to known diabetes mellitus. Electronically Signed   By: Lowella Grip III M.D.   On: 03/05/2019 11:21        Scheduled Meds: . amLODipine  5 mg Oral Daily  . atorvastatin  10 mg Oral q1800  . chlorhexidine  15 mL Mouth Rinse BID  . Chlorhexidine Gluconate Cloth  6 each Topical Q0600  . Chlorhexidine Gluconate Cloth  6 each Topical Daily  . famotidine  20 mg Oral Daily  . feeding supplement (ENSURE ENLIVE)  237 mL Oral BID BM  . folic acid  1 mg Oral Daily  . hydrocortisone sod succinate (SOLU-CORTEF) inj  50 mg Intravenous Q12H  . insulin aspart  0-20 Units Subcutaneous Q4H  . labetalol  100 mg Oral BID  . mouth rinse  15 mL Mouth Rinse q12n4p  . mycophenolate  360 mg Oral BID  . sodium chloride flush  10-40 mL Intracatheter Q12H  . sulfamethoxazole-trimethoprim  1 tablet Oral Q M,W,F  . tacrolimus  2 mg Oral BID  . tamsulosin  0.4 mg Oral Daily  . thiamine  250 mg Oral Daily  . Warfarin - Pharmacist Dosing Inpatient   Does not apply q1800   Continuous Infusions: . sodium chloride Stopped (03/03/19 1054)  . ampicillin-sulbactam (UNASYN) IV 3 g (03/05/19 1816)     LOS: 6 days    Time spent: over 30 min    Fayrene Helper, MD [facility] Pager AMION  If 7PM-7AM,  please contact night-coverage [email or web address] Password [email or web address] 03/05/2019, 7:01 PM

## 2019-03-05 NOTE — Progress Notes (Signed)
PHARMACY NOTE:  ANTIMICROBIAL RENAL DOSAGE ADJUSTMENT  Current antimicrobial regimen includes a mismatch between antimicrobial dosage and estimated renal function.  As per policy approved by the Pharmacy & Therapeutics and Medical Executive Committees, the antimicrobial dosage will be adjusted accordingly.  Current antimicrobial dosage:  Unasyn 3 gm Q 8 hours  Indication: Acinetobacter bacteremia   Renal Function:  Estimated Creatinine Clearance: 63.3 mL/min (A) (by C-G formula based on SCr of 1.29 mg/dL (H)). []      On intermittent HD, scheduled: []      On CRRT    Antimicrobial dosage has been changed to:  Unasyn 3 gm q 6 hours   Additional comments: Increase dose with improved renal function to optimize treatment of bacteremia    Thank you for allowing pharmacy to be a part of this patient's care.  Jimmy Footman, PharmD, BCPS, [place] [facility] Clinical Pharmacist Phone: [phone] 03/05/2019 10:08 AM

## 2019-03-05 NOTE — Consult Note (Signed)
Physical Medicine and Rehabilitation Consult   Reason for Consult: Debility Referring Physician: Dr. Florene Glen   HPI: Ryan Rice is a 68 y.o. male with history of HTN, DM, ESRD s/p renal transplant, CAF who was admitted on 02/27/19 with diarrhea and abdominal pain. He was found to be hypoxic with increased WOB due to extensive L> R PNA with sepsis. He was COVID negative. He was placed on BIPAP and started on broad spectrum.  He developed hypotension with worsening of respiratory status requiring pressors, stress dose steriods and intubation on 6/04.  Nephrology consulted due to acute on chronic renal failure and patient started on bicarb for metabolic acidosis with close monitoring of urine output. He was tolerated extubation by 6/6 and on regular diet.  Renal status improving with increase in UOP.  Blood cultures positive for Acinetobacter and antibiotics narrowed to Unasyn.  Hospital course significant for RUE edema, elevated troponin due to demand ischemia as well as incidental radiologic findings of mass on native kidney and mass on prostate. His mentation improving but he continues to have hypoxia with activity, disorientation with difficulty processing as well as unsteady gait. Left ankle Xrays done due to pain and swelling and revealed medial OA with widespread arterial vascular calcifications.  CIR recommended due to debility.     Review of Systems  Constitutional: Negative for chills and fever.  HENT: Negative for hearing loss and tinnitus.   Eyes: Negative for blurred vision and double vision.  Respiratory: Negative for cough and shortness of breath.   Cardiovascular: Negative for chest pain, palpitations and leg swelling.  Gastrointestinal: Negative for heartburn and nausea.  Musculoskeletal: Positive for joint pain (left hip and left ankle pain) and myalgias.  Skin: Negative for rash.  Neurological: Positive for weakness. Negative for dizziness and headaches.   Psychiatric/Behavioral: Negative for memory loss. The patient is not nervous/anxious.       Past Medical History:  Diagnosis Date  . Allergic rhinitis   . BPH (benign prostatic hyperplasia)   . Chronic atrial fibrillation   . Diabetes mellitus ([place])   . DVT (deep venous thrombosis) ([facility])    LLE  . Hematochezia   . HTN (hypertension)   . Kidney transplant recipient   . PVD (peripheral vascular disease) ([place])     Past Surgical History:  Procedure Laterality Date  . KIDNEY TRANSPLANT       Family History  Problem Relation Age of Onset  . Stroke Mother   . Heart disease Father   . Diabetes Father   . Diabetes Sister   . Diabetes Brother   . Heart disease Brother      Social History: Married. 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 He used to smoke 1/2 PPD X 15 years --quit 11/86. He uses alcohol on rare occasions.      Allergies  Allergen Reactions  . Iodinated Diagnostic Agents Hives  . Tape Hives    Medications Prior to Admission  Medication Sig Dispense Refill  . atorvastatin (LIPITOR) 10 MG tablet Take 10 mg by mouth daily.    . cinacalcet (SENSIPAR) 60 MG tablet Take 60 mg by mouth daily.    . diclofenac sodium (VOLTAREN) 1 % GEL Apply 2 g topically 2 (two) times a day. Left hip    . dorzolamide-timolol (COSOPT) 22.3-6.8 MG/ML ophthalmic solution Place 1 drop into the right eye 2 (two) times a day.    . erythromycin ophthalmic ointment Place  1 application into both eyes at bedtime.    . famotidine (PEPCID) 20 MG tablet Take 20 mg by mouth 2 (two) times daily.    . finasteride (PROSCAR) 5 MG tablet Take 5 mg by mouth daily.    Marland Kitchen labetalol (NORMODYNE) 100 MG tablet Take 100 mg by mouth 2 (two) times daily.    . magnesium oxide (MAG-OX) 400 MG tablet Take 400 mg by mouth daily.    . metFORMIN (GLUCOPHAGE) 500 MG tablet Take 500 mg by mouth 2 (two) times daily with a meal.     . mycophenolate (MYFORTIC) 180 MG EC tablet Take 360 mg by mouth 2  (two) times daily.     . nateglinide (STARLIX) 120 MG tablet Take 120 mg by mouth See admin instructions. Take 120mg  before lunch and dinner, skip dose if blood sugar is under 130.    Marland Kitchen NIFEdipine (PROCARDIA XL/NIFEDICAL XL) 60 MG 24 hr tablet Take 60 mg by mouth 2 (two) times a day.     . pioglitazone (ACTOS) 30 MG tablet Take 15 mg by mouth daily.     . predniSONE (DELTASONE) 10 MG tablet Take 10 mg by mouth daily with breakfast.    . sitaGLIPtin (JANUVIA) 100 MG tablet Take 100 mg by mouth daily.    Marland Kitchen sulfamethoxazole-trimethoprim (BACTRIM) 400-80 MG tablet Take 1 tablet by mouth every Monday, Wednesday, and Friday.     . tacrolimus (PROGRAF) 1 MG capsule Take 2 mg by mouth 2 (two) times daily.     . tamsulosin (FLOMAX) 0.4 MG CAPS capsule Take 0.4 mg by mouth daily after breakfast.     . warfarin (COUMADIN) 5 MG tablet Take 2.5-5 mg by mouth daily. Take 2.5mg  on MON TUES THUR FRI SAT, then take 5mg  on WED and SUN.      Home: Home Living Family/patient expects to be discharged to:: Private residence Living Arrangements: Spouse/significant other Available Help at Discharge: Family, Available 24 hours/day Type of Home: House Home Access: Stairs to enter [employer] of Steps: 3 Entrance Stairs-Rails: None Home Layout: One level Bathroom Shower/Tub: Chiropodist: Standard Home Equipment: Environmental consultant - 2 wheels  Functional History: Prior Function Level of Independence: Independent Comments: Mowed yard on day of admit Functional Status:  Mobility: Bed Mobility Overal bed mobility: Needs Assistance Bed Mobility: Supine to Sit, Sit to Supine Supine to sit: Min guard, HOB elevated General bed mobility comments: Incr time. Assist for safety and lines Transfers Overall transfer level: Needs assistance Equipment used: Rolling walker (2 wheeled) Transfers: Sit to/from Stand, Stand Pivot Transfers Sit to Stand: Max assist, +2 physical assistance, +2  safety/equipment, Mod assist Stand pivot transfers: Max assist, +2 physical assistance, +2 safety/equipment General transfer comment: Assist to bring hips up and for balance. Legs unstable. Short shuffling steps. Verbal cues for hand placement Ambulation/Gait Ambulation/Gait assistance: Mod assist, +2 safety/equipment Gait Distance (Feet): 75 Feet Assistive device: Rolling walker (2 wheeled) Gait Pattern/deviations: Step-to pattern, Decreased step length - right, Decreased step length - left, Shuffle, Trunk flexed, Drifts right/left General Gait Details: Assist with balance and support. Close chair follow. Legs unstable. Pt did better the longer he walked.  Needed constant cues for upright posture.  Drifts left needing assist and cues to steer to right.  Gait velocity: decr Gait velocity interpretation: <1.31 ft/sec, indicative of household ambulator    ADL: ADL Overall ADL's : Needs assistance/impaired Grooming: Wash/dry hands, Wash/dry face, Set up, Sitting Upper Body Bathing: Minimal assistance, Sitting Lower Body  Bathing: Maximal assistance, Sitting/lateral leans, Sit to/from stand, Cueing for sequencing, Cueing for safety Upper Body Dressing : Minimal assistance, Sitting Lower Body Dressing: Maximal assistance, Cueing for safety, Cueing for sequencing, Sitting/lateral leans, Sit to/from stand Toilet Transfer: Maximal assistance, +2 for physical assistance, +2 for safety/equipment, BSC Toileting- Clothing Manipulation and Hygiene: Maximal assistance, +2 for physical assistance, +2 for safety/equipment, Sitting/lateral lean, Sit to/from stand Functional mobility during ADLs: (deferred) General ADL Comments: Pt limited by weakness, poor attention, decreased cognitive abilities and decreased strength requiring assist and sequencing assist for ADL  Cognition: Cognition Overall Cognitive Status: Impaired/Different from baseline Orientation Level: Oriented X4 Cognition Arousal/Alertness:  Awake/alert Overall Cognitive Status: Impaired/Different from baseline Area of Impairment: Orientation, Memory, Attention, Following commands, Safety/judgement, Awareness, Problem solving Orientation Level: Disoriented to, Place, Situation Current Attention Level: Sustained Memory: Decreased short-term memory Following Commands: Follows one step commands with increased time Safety/Judgement: Decreased awareness of safety, Decreased awareness of deficits Awareness: Intellectual Problem Solving: Slow processing, Decreased initiation, Requires verbal cues, Requires tactile cues   Blood pressure 128/69, pulse 88, temperature 98.7 F (37.1 C), resp. rate 16, height 5\' 11"  (1.803 m), weight 91.1 kg, SpO2 94 %. Physical Exam  Nursing note and vitals reviewed. Constitutional: He is oriented to person, place, and time. He appears well-developed and well-nourished. No distress.  HENT:  Head: Normocephalic.  Eyes: Pupils are equal, round, and reactive to light. EOM are normal.  Neck: Normal range of motion. No thyromegaly present.  Cardiovascular: Normal rate and regular rhythm.  Respiratory: Effort normal. No respiratory distress.  GI: Soft. He exhibits no distension.  Musculoskeletal:        General: Edema (1 to 2+ LE>UE ) present.     Comments: 1+ pitting edema bilateral forearms/hands. Abrasions left elbow and left knee.   Neurological: He is alert and oriented to person, place, and time. No cranial nerve deficit. Coordination normal.  Speech clear. Able to follow commands without difficulty. Normal insight and awareness. UE 4/5. LE: 3/5 HF, KE and 4/5 ADF/PF. No focal sensory deficits  Skin: Skin is warm and dry. He is not diaphoretic.  Psychiatric: He has a normal mood and affect. His behavior is normal.    Results for orders placed or performed during the hospital encounter of 02/27/19 (from the past 24 hour(s))  Glucose, capillary     Status: Abnormal   Collection Time: 03/04/19 11:48  AM  Result Value Ref Range   Glucose-Capillary 154 (H) 70 - 99 mg/dL  Glucose, capillary     Status: Abnormal   Collection Time: 03/04/19  3:30 PM  Result Value Ref Range   Glucose-Capillary 185 (H) 70 - 99 mg/dL  Glucose, capillary     Status: Abnormal   Collection Time: 03/04/19  7:42 PM  Result Value Ref Range   Glucose-Capillary 252 (H) 70 - 99 mg/dL  Glucose, capillary     Status: Abnormal   Collection Time: 03/04/19 11:16 PM  Result Value Ref Range   Glucose-Capillary 140 (H) 70 - 99 mg/dL   Comment 1 Document in Chart   Protime-INR     Status: Abnormal   Collection Time: 03/05/19  3:53 AM  Result Value Ref Range   Prothrombin Time 22.6 (H) 11.4 - 15.2 seconds   INR 2.0 (H) 0.8 - 1.2  Renal function panel     Status: Abnormal   Collection Time: 03/05/19  3:53 AM  Result Value Ref Range   Sodium 140 135 - 145 mmol/L   Potassium  3.0 (L) 3.5 - 5.1 mmol/L   Chloride 107 98 - 111 mmol/L   CO2 22 22 - 32 mmol/L   Glucose, Bld 171 (H) 70 - 99 mg/dL   BUN 38 (H) 8 - 23 mg/dL   Creatinine, Ser 1.29 (H) 0.61 - 1.24 mg/dL   Calcium 8.3 (L) 8.9 - 10.3 mg/dL   Phosphorus 2.2 (L) 2.5 - 4.6 mg/dL   Albumin 2.3 (L) 3.5 - 5.0 g/dL   GFR calc non Af Amer 57 (L) >60 mL/min   GFR calc Af Amer >60 >60 mL/min   Anion gap 11 5 - 15  CBC with Differential/Platelet     Status: Abnormal   Collection Time: 03/05/19  3:53 AM  Result Value Ref Range   WBC 10.2 4.0 - 10.5 K/uL   RBC 3.84 (L) 4.22 - 5.81 MIL/uL   Hemoglobin 10.6 (L) 13.0 - 17.0 g/dL   HCT 32.7 (L) 39.0 - 52.0 %   MCV 85.2 80.0 - 100.0 fL   MCH 27.6 26.0 - 34.0 pg   MCHC 32.4 30.0 - 36.0 g/dL   RDW 15.9 (H) 11.5 - 15.5 %   Platelets 85 (L) 150 - 400 K/uL   nRBC 0.2 0.0 - 0.2 %   Neutrophils Relative % 81 %   Neutro Abs 8.3 (H) 1.7 - 7.7 K/uL   Lymphocytes Relative 3 %   Lymphs Abs 0.3 (L) 0.7 - 4.0 K/uL   Monocytes Relative 9 %   Monocytes Absolute 0.9 0.1 - 1.0 K/uL   Eosinophils Relative 0 %   Eosinophils Absolute  0.0 0.0 - 0.5 K/uL   Basophils Relative 0 %   Basophils Absolute 0.0 0.0 - 0.1 K/uL   Immature Granulocytes 7 %   Abs Immature Granulocytes 0.69 (H) 0.00 - 0.07 K/uL   Polychromasia PRESENT   Lipid panel     Status: Abnormal   Collection Time: 03/05/19  3:53 AM  Result Value Ref Range   Cholesterol 108 0 - 200 mg/dL   Triglycerides 75 <150 mg/dL   HDL 28 (L) >40 mg/dL   Total CHOL/HDL Ratio 3.9 RATIO   VLDL 15 0 - 40 mg/dL   LDL Cholesterol 65 0 - 99 mg/dL  Magnesium     Status: None   Collection Time: 03/05/19  3:53 AM  Result Value Ref Range   Magnesium 1.8 1.7 - 2.4 mg/dL  Glucose, capillary     Status: Abnormal   Collection Time: 03/05/19  5:27 AM  Result Value Ref Range   Glucose-Capillary 150 (H) 70 - 99 mg/dL  Glucose, capillary     Status: Abnormal   Collection Time: 03/05/19  7:43 AM  Result Value Ref Range   Glucose-Capillary 142 (H) 70 - 99 mg/dL   No results found.   Assessment/Plan: Diagnosis: 68 yo male with debility after pneumonia, sepsis. Previously active 1. Does the need for close, 24 hr/day medical supervision in concert with the patient's rehab needs make it unreasonable for this patient to be served in a less intensive setting? Yes 2. Co-Morbidities requiring supervision/potential complications: HTN, DM, ESRD, CAF 3. Due to bladder management, bowel management, safety, skin/wound care, disease management, medication administration, pain management and patient education, does the patient require 24 hr/day rehab nursing? Yes 4. Does the patient require coordinated care of a physician, rehab nurse, PT (1-2 hrs/day, 5 days/week) and OT (1-2 hrs/day, 5 days/week) to address physical and functional deficits in the context of the above medical diagnosis(es)? Yes Addressing  deficits in the following areas: balance, endurance, locomotion, strength, transferring, bowel/bladder control, bathing, dressing, feeding, grooming, toileting and psychosocial support 5. Can  the patient actively participate in an intensive therapy program of at least 3 hrs of therapy per day at least 5 days per week? Yes 6. The potential for patient to make measurable gains while on inpatient rehab is excellent 7. Anticipated functional outcomes upon discharge from inpatient rehab are modified independent and supervision  with PT, modified independent and supervision with OT, n/a with SLP. 8. Estimated rehab length of stay to reach the above functional goals is: 11-16 days 9. Anticipated D/C setting: Home 10. Anticipated post D/C treatments: [place] therapy 11. Overall Rehab/Functional Prognosis: excellent  RECOMMENDATIONS: This patient's condition is appropriate for continued rehabilitative care in the following setting: CIR Patient has agreed to participate in recommended program. Yes Note that insurance prior authorization may be required for reimbursement for recommended care.  Comment: Rehab Admissions Coordinator to follow up.  Thanks,  Meredith Staggers, MD, Mellody Drown  I have personally performed a face to face diagnostic evaluation of this patient. Additionally, I have examined pertinent labs and radiographic images. I have reviewed and concur with the physician assistant's documentation above.    Bary Leriche, PA-C 03/05/2019

## 2019-03-05 NOTE — Progress Notes (Addendum)
[facility] Progress Note    Assessment/ Plan:   68 year old BM with history of a kidney transplant and other medical issues now presenting with sepsis/ sepsis/bacteremia 1.Renal-known history of kidney transplant with crt of 1.44 just daysPTA. Now with A on CRF in the setting of sepsis. Due to excellent care from [facility] has improved rapidly- more hemodynamically stable. Making urine- morenow- no indications for dialysis at present. 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 Would not study not to avoid contrast.  - Fortunately moving towards baseline; even a few months ago Cr was 1.7 and now 1.3.  Signing off; please reconsult as needed.   2.Sepsis-Acinetobacter bacteremia in an immunocompromised patient. Broad spectrum antibiotics and supportive care. Stopped myfortic in the setting of active infection 3. Immune suppression-home reg prograf 2/2- myfortic 360 BID and pred 10- Is currently on prograf 2/2 and stress dose steroids.  -  Will restart Myfortic.  4.Metabolic acidosis- improved 5. Hyperkalemia-better-  6. Anemia- does not appear to be a major issue right now  7. HTN/vol-Getting more volume overloaded but extubated- no peripheral edema. Restarted outpt regimen 10PM 6/6 and better controlled now.  Subjective:   Feeling much better with less dyspnea. No f/c/n/v.   Objective:   BP 135/72 (BP Location: Right Arm)   Pulse 76   Temp 98.3 F (36.8 C)   Resp 20   Ht 5\' 11"  (1.803 m)   Wt 91.1 kg   SpO2 99%   BMI 28.01 kg/m   Intake/Output Summary (Last 24 hours) at 03/05/2019 1542 Last data filed at 03/05/2019 1143 Gross per 24 hour  Intake 230 ml  Output 630 ml  Net -400 ml   Weight change:   Physical Exam: GEN: NAD, A&Ox3, NCAT HEENT: No conjunctival pallor, EOMI NECK: Supple, no thyromegaly LUNGS: CTA  B/L no rales, rhonchi or wheezing CV: RRR, No M/R/G ABD: SNDNT +BS  EXT: No lower extremity edema ACCESS: lt BCF (puffy sound)    Imaging: Dg Ankle Complete Left  Result Date: 03/05/2019 CLINICAL DATA:  Soft tissue swelling and pain EXAM: LEFT ANKLE COMPLETE - 3+ VIEW COMPARISON:  None. FINDINGS: Frontal, oblique, and lateral views were obtained. There is no fracture or joint effusion. 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 No erosive changes evident. The ankle mortise appears intact. There is widespread arterial vascular calcification. IMPRESSION: Osteoarthritic change medially. No fracture. Ankle mortise appears intact. There is widespread arterial vascular calcification, likely due to known diabetes mellitus. Electronically Signed   By: Lowella Grip III M.D.   On: 03/05/2019 11:21    Labs: BMET Recent Labs  Lab 02/28/19 0546  03/01/19 0349 03/02/19 0500 03/02/19 9629 03/03/19 0140 03/03/19 0328 03/03/19 2353 03/04/19 0420 03/05/19 0353  NA  --    < > 140 140 141 142 142 141 141 140  K  --    < > 4.3 3.4* 3.4* 3.7 3.7 3.5 3.2* 3.0*  CL  --    < > 112* 108  --  111 110 108 109 107  CO2  --    < > 15* 20*  --  22 22 22 23 22   GLUCOSE  --    < > 185* 227*  --  151* 141* 192* 206* 171*  BUN  --    < > 44* 53*  --  58* 60* 56* 53*  38*  CREATININE  --    < > 2.37* 2.41*  --  2.03* 1.93* 1.82* 1.71* 1.29*  CALCIUM  --    < > 7.4* 7.5*  --  7.9* 8.1* 8.0* 8.2* 8.3*  PHOS 3.9  --  4.0 2.2*  --   --  3.5  --  2.9 2.2*   < > = values in this interval not displayed.   CBC Recent Labs  Lab 03/02/19 0500 03/02/19 0508 03/03/19 0328 03/04/19 0420 03/05/19 0353  WBC 12.9*  --  12.8* 11.1* 10.2  NEUTROABS 5.3  --  11.7* 9.4* 8.3*  HGB 10.1* 9.9* 10.6* 11.4* 10.6*  HCT 31.0* 29.0* 33.4* 35.8* 32.7*  MCV 84.0  --  85.6 84.8 85.2  PLT 70*  --  67* 81* 85*    Medications:    . amLODipine  5 mg Oral Daily  . atorvastatin  10 mg Oral q1800  .  chlorhexidine  15 mL Mouth Rinse BID  . Chlorhexidine Gluconate Cloth  6 each Topical Q0600  . Chlorhexidine Gluconate Cloth  6 each Topical Daily  . famotidine  20 mg Oral Daily  . feeding supplement (ENSURE ENLIVE)  237 mL Oral BID BM  . folic acid  1 mg Oral Daily  . hydrocortisone sod succinate (SOLU-CORTEF) inj  50 mg Intravenous Q12H  . insulin aspart  0-20 Units Subcutaneous Q4H  . labetalol  100 mg Oral BID  . mouth rinse  15 mL Mouth Rinse q12n4p  . sodium chloride flush  10-40 mL Intracatheter Q12H  . sulfamethoxazole-trimethoprim  1 tablet Oral Q M,W,F  . tacrolimus  2 mg Oral BID  . tamsulosin  0.4 mg Oral Daily  . thiamine  250 mg Oral Daily  . warfarin  2.5 mg Oral ONCE-1800  . Warfarin - Pharmacist Dosing Inpatient   Does not apply q1800      Otelia Santee, MD 03/05/2019, 3:42 PM

## 2019-03-05 NOTE — Care Management Important Message (Signed)
Important Message  Patient Details  Name: Ryan Rice MRN: [id number] Date of Birth: [birth year]   Medicare Important Message Given:  Yes    Orbie Pyo 03/05/2019, 3:21 PM

## 2019-03-05 NOTE — Progress Notes (Signed)
[place] for Warfarin Indication: History of DVT  Allergies  Allergen Reactions  . Iodinated Diagnostic Agents Hives  . Tape Hives    Patient Measurements: Height: 5\' 11"  (180.3 cm) Weight: 200 lb 13.4 oz (91.1 kg) IBW/kg (Calculated) : 75.3  Heparin dosing Wt: 85  Vital Signs: Temp: 98.9 F (37.2 C) (06/09 0520) Temp Source: Oral (06/08 2312) BP: 152/76 (06/09 0520) Pulse Rate: 87 (06/09 0520)  Labs: Recent Labs    03/03/19 0328 03/03/19 2353 03/04/19 0420 03/05/19 0353  HGB 10.6*  --  11.4* 10.6*  HCT 33.4*  --  35.8* 32.7*  PLT 67*  --  81* 85*  LABPROT 21.2*  --  19.1* 22.6*  INR 1.9*  --  1.6* 2.0*  CREATININE 1.93* 1.82* 1.71* 1.29*  TROPONINI 0.23*  --   --   --     Estimated Creatinine Clearance: 63.3 mL/min (A) (by C-G formula based on SCr of 1.29 mg/dL (H)).   Medical History: Past Medical History:  Diagnosis Date  . Kidney transplant recipient    Assessment: 68 yo M Transfer from outside hospital with diarrhea/dyspnea. On warfarin PTA for history of DVT. Pt was briefly on heparin gtt on 6/7, now pharmacy consulted to manage warfarin therapy.  INR today 2  PTA warfarin regimen: 5mg  on Wed/Sun, 2.5mg  all other days  Note possible drug-drug interaction with Unasyn and Bactrim, however bactrim was also a PTA medication.  Goal of Therapy:  Heparin level 0.3-0.7 units/ml Monitor platelets by anticoagulation protocol: Yes   Plan:   Warfarin 2.5mg  PO x1  Daily INR, CBC  Ryan Rice, PharmD, [place] Please utilize Amion for appropriate phone number to reach the unit pharmacist ([place])   03/05/2019       8:01 AM

## 2019-03-06 DIAGNOSIS — N179 Acute kidney failure, unspecified: Secondary | ICD-10-CM

## 2019-03-06 DIAGNOSIS — A4159 Other Gram-negative sepsis: Secondary | ICD-10-CM

## 2019-03-06 DIAGNOSIS — R6520 Severe sepsis without septic shock: Secondary | ICD-10-CM

## 2019-03-06 DIAGNOSIS — Z940 Kidney transplant status: Secondary | ICD-10-CM

## 2019-03-06 LAB — CBC WITH DIFFERENTIAL/PLATELET
Abs Immature Granulocytes: 0.85 10*3/uL — ABNORMAL HIGH (ref 0.00–0.07)
Basophils Absolute: 0 10*3/uL (ref 0.0–0.1)
Basophils Relative: 0 %
Eosinophils Absolute: 0 10*3/uL (ref 0.0–0.5)
Eosinophils Relative: 0 %
HCT: 31.6 % — ABNORMAL LOW (ref 39.0–52.0)
Hemoglobin: 10.2 g/dL — ABNORMAL LOW (ref 13.0–17.0)
Immature Granulocytes: 8 %
Lymphocytes Relative: 3 %
Lymphs Abs: 0.4 10*3/uL — ABNORMAL LOW (ref 0.7–4.0)
MCH: 27.3 pg (ref 26.0–34.0)
MCHC: 32.3 g/dL (ref 30.0–36.0)
MCV: 84.7 fL (ref 80.0–100.0)
Monocytes Absolute: 0.8 10*3/uL (ref 0.1–1.0)
Monocytes Relative: 7 %
Neutro Abs: 9.1 10*3/uL — ABNORMAL HIGH (ref 1.7–7.7)
Neutrophils Relative %: 82 %
Platelets: 89 10*3/uL — ABNORMAL LOW (ref 150–400)
RBC: 3.73 MIL/uL — ABNORMAL LOW (ref 4.22–5.81)
RDW: 15.9 % — ABNORMAL HIGH (ref 11.5–15.5)
WBC: 11.2 10*3/uL — ABNORMAL HIGH (ref 4.0–10.5)
nRBC: 0 % (ref 0.0–0.2)

## 2019-03-06 LAB — GLUCOSE, CAPILLARY
Glucose-Capillary: 146 mg/dL — ABNORMAL HIGH (ref 70–99)
Glucose-Capillary: 147 mg/dL — ABNORMAL HIGH (ref 70–99)
Glucose-Capillary: 172 mg/dL — ABNORMAL HIGH (ref 70–99)
Glucose-Capillary: 183 mg/dL — ABNORMAL HIGH (ref 70–99)
Glucose-Capillary: 216 mg/dL — ABNORMAL HIGH (ref 70–99)
Glucose-Capillary: 259 mg/dL — ABNORMAL HIGH (ref 70–99)

## 2019-03-06 LAB — RENAL FUNCTION PANEL
Albumin: 2.2 g/dL — ABNORMAL LOW (ref 3.5–5.0)
Anion gap: 9 (ref 5–15)
BUN: 27 mg/dL — ABNORMAL HIGH (ref 8–23)
CO2: 23 mmol/L (ref 22–32)
Calcium: 8.5 mg/dL — ABNORMAL LOW (ref 8.9–10.3)
Chloride: 108 mmol/L (ref 98–111)
Creatinine, Ser: 1.1 mg/dL (ref 0.61–1.24)
GFR calc Af Amer: >60 mL/min (ref >60)
GFR calc non Af Amer: >60 mL/min (ref >60)
Glucose, Bld: 194 mg/dL — ABNORMAL HIGH (ref 70–99)
Phosphorus: 1.8 mg/dL — ABNORMAL LOW (ref 2.5–4.6)
Potassium: 3.3 mmol/L — ABNORMAL LOW (ref 3.5–5.1)
Sodium: 140 mmol/L (ref 135–145)

## 2019-03-06 LAB — URIC ACID: Uric Acid, Serum: 7.8 mg/dL (ref 3.7–8.6)

## 2019-03-06 LAB — PROTIME-INR
INR: 2.2 — ABNORMAL HIGH (ref 0.8–1.2)
Prothrombin Time: 24.4 seconds — ABNORMAL HIGH (ref 11.4–15.2)

## 2019-03-06 LAB — MAGNESIUM: Magnesium: 1.6 mg/dL — ABNORMAL LOW (ref 1.7–2.4)

## 2019-03-06 MED ORDER — SODIUM CHLORIDE 0.9 % IV SOLN
3.0000 g | Freq: Four times a day (QID) | INTRAVENOUS | Status: DC
  Administered 2019-03-06 – 2019-03-08 (×9): 3 g via INTRAVENOUS
  Filled 2019-03-06 (×11): qty 3

## 2019-03-06 MED ORDER — WARFARIN SODIUM 2.5 MG PO TABS
2.5000 mg | ORAL_TABLET | Freq: Once | ORAL | Status: AC
  Administered 2019-03-06: 2.5 mg via ORAL
  Filled 2019-03-06: qty 1

## 2019-03-06 NOTE — Progress Notes (Signed)
Nutrition Follow-up  RD working remotely.  DOCUMENTATION CODES:   Not applicable  INTERVENTION:   -Continue Ensure Enlive po BID, each supplement provides 350 kcal and 20 grams of protein  NUTRITION DIAGNOSIS:   Inadequate oral intake related to decreased appetite as evidenced by meal completion < 50%.  Progressing  GOAL:   Patient will meet greater than or equal to 90% of their needs  Progressing  MONITOR:   PO intake, Supplement acceptance, Labs, Skin, I & O's  REASON FOR ASSESSMENT:   Consult Enteral/tube feeding initiation and management  ASSESSMENT:   68 yo male with PMH of ESRD s/p renal transplant, DM, HTN, HLD, DVT who was admitted with diarrhea, multifocal PNA, sepsis. Required intubation early 6/4.  6/6- extubated 6/8- s/p BSE- recommend continue heart healthy, carb modified diet  Reviewed I/O's: -20 ml x 24 hours and +13 L since admission  UOP: 440 ml x 24 hours  Nephrology signed off on 03/05/19; renal function has improved and no indications for HD at present. Pt making urine.   Pt's appetite is improving; meal completion now 25-100%. Pt is also accepting Ensure supplements (prefers chocolate). RD will continue supplements to help meet increased nutritional needs for acute illness in addition to what heart healthy, carb modified diet is able to provide.   Medications reviewed and include prednisone.   Per MD notes, plan to d/c to CIR once medically stable.  Labs reviewed: K: 3.3, Mg: 1.6, CBGS: 147-205 (inpatient orders for glycemic control are 0-20 units insulin aspart every 4 hours).   Diet Order:   Diet Order            Diet heart healthy/carb modified Room service appropriate? Yes; Fluid consistency: Thin  Diet effective now              EDUCATION NEEDS:   No education needs have been identified at this time  Skin:  Skin Assessment: Reviewed RN Assessment  Last BM:  03/06/19  Height:   Ht Readings from Last 1 Encounters:   02/28/19 5\' 11"  (1.803 m)    Weight:   Wt Readings from Last 1 Encounters:  03/06/19 93.5 kg    Ideal Body Weight:  78.2 kg  BMI:  Body mass index is 28.75 kg/m.  Estimated Nutritional Needs:   Kcal:  2100-2300  Protein:  100-120 gm  Fluid:  >/= 2.1 L    Joana Nolton A. Jimmye Norman, RD, LDN, [place] Registered Dietitian II Certified Diabetes Care and Education Specialist Pager: [phone] After hours Pager: [phone]

## 2019-03-06 NOTE — Progress Notes (Signed)
Physical Therapy Treatment Patient Details Name: Ryan Rice MRN: [id number] DOB: 01-Jun-1951 Today's Date: 03/06/2019    History of Present Illness Pt adm with respiratory failure due to PNA. Pt intubated 6/4 and extubated 6/7. PMH - ESRD with renal transplant, DM, HTN, DVT, PVD, gout    PT Comments    Patient seen for mobility progression. Pt is limited this session by bilat LE pain due to edema. Pt is agreeable to OOB and is very motivated to participate in therapy however unable to tolerate full weight bearing on feet today. Pt educated on lateral scoot transfer to drop arm recliner and requires min guard assist for safety and encouraged to continue bilat LE therex to assist with decreasing edema. Continue to progress as tolerated.      Follow Up Recommendations  CIR     Equipment Recommendations  (TBD)    Recommendations for Other Services       Precautions / Restrictions Precautions Precautions: Fall    Mobility  Bed Mobility Overal bed mobility: Needs Assistance Bed Mobility: Supine to Sit     Supine to sit: Supervision;HOB elevated     General bed mobility comments: increased time;supervision for safety  Transfers Overall transfer level: Needs assistance   Transfers: Lateral/Scoot Transfers          Lateral/Scoot Transfers: Min guard General transfer comment: due to pain in bilateral feet, unable to come into standing this session;minguard for safety;pt scooted from EOB to recliner, DoE 2/4  Ambulation/Gait                 Stairs             Wheelchair Mobility    Modified Rankin (Stroke Patients Only)       Balance Overall balance assessment: Needs assistance Sitting-balance support: No upper extremity supported;Feet supported Sitting balance-Leahy Scale: Fair Sitting balance - Comments: able to lean over to reach toes while sitting EOB, no LOB noted                                    Cognition  Arousal/Alertness: Awake/alert Behavior During Therapy: WFL for tasks assessed/performed Overall Cognitive Status: No family/caregiver present to determine baseline cognitive functioning                                 General Comments: pt very appreciative to be out of bed and sitting in recliner       Exercises General Exercises - Lower Extremity Ankle Circles/Pumps: AROM;Both Long Arc Quad: AROM;Both    General Comments General comments (skin integrity, edema, etc.): VSS throughout session;noted increased edema in BLE L>R;pt reports increased pain level in bilateral feet, limiting participation during session;educated pt on importance of mobility to assist with edema managament       Pertinent Vitals/Pain Pain Assessment: Faces Faces Pain Scale: Hurts even more Pain Location: bilateral LE Pain Descriptors / Indicators: Discomfort;Guarding;Grimacing;Sore;Tender Pain Intervention(s): Limited activity within patient's tolerance;Monitored during session;Repositioned    Home Living                      Prior Function            PT Goals (current goals can now be found in the care plan section) Acute Rehab PT Goals Patient Stated Goal: to fish and get back to his company [employer]  care) Progress towards PT goals: Progressing toward goals    Frequency    Min 3X/week      PT Plan Current plan remains appropriate    Co-evaluation   Reason for Co-Treatment: For patient/therapist safety;To address functional/ADL transfers   OT goals addressed during session: ADL's and self-care      AM-PAC PT "6 Clicks" Mobility   Outcome Measure  Help needed turning from your back to your side while in a flat bed without using bedrails?: None Help needed moving from lying on your back to sitting on the side of a flat bed without using bedrails?: A [place] Help needed moving to and from a bed to a chair (including a wheelchair)?: A Lot Help needed standing up from  a chair using your arms (e.g., wheelchair or bedside chair)?: A Lot Help needed to walk in hospital room?: A Lot Help needed climbing 3-5 steps with a railing? : Total 6 Click Score: 14    End of Session   Activity Tolerance: Patient limited by pain Patient left: in chair;with call bell/phone within reach Nurse Communication: Mobility status PT Visit Diagnosis: Other abnormalities of gait and mobility (R26.89);Muscle weakness (generalized) (M62.81)     Time: 1245-8099 PT Time Calculation (min) (ACUTE ONLY): 31 min  Charges:  $Therapeutic Activity: 8-22 mins                     [name], PTA Acute Rehabilitation Services Pager: [phone] Office: [phone]     [name] 03/06/2019, 4:31 PM

## 2019-03-06 NOTE — Progress Notes (Signed)
Inpatient Diabetes Program Recommendations  AACE/ADA: New Consensus Statement on Inpatient Glycemic Control (2015)  Target Ranges:  Prepandial:   less than 140 mg/dL      Peak postprandial:   less than 180 mg/dL (1-2 hours)      Critically ill patients:  140 - 180 mg/dL   Lab Results  Component Value Date   GLUCAP 147 (H) 03/06/2019   HGBA1C 6.2 (H) 03/04/2019    Review of Glycemic Control  Blood sugars past 24H - 142-267 mg/dL. Steroids tapering down to Pred 40 mg QD On CHO mod diet.  Inpatient Diabetes Program Recommendations:     Decrease Novolog to 0-15 units tidwc and hs May need Novolog 2-3 units tidwc if post-prandials > 180 mg/dL  Will follow glucose trends.  Thank you. Lorenda Peck, RD, LDN, CDE Inpatient Diabetes Coordinator [phone]

## 2019-03-06 NOTE — Progress Notes (Signed)
[place] for Warfarin Indication: History of DVT  Allergies  Allergen Reactions  . Iodinated Diagnostic Agents Hives  . Tape Hives    Patient Measurements: Height: 5\' 11"  (180.3 cm) Weight: 206 lb 2.1 oz (93.5 kg) IBW/kg (Calculated) : 75.3  Heparin dosing Wt: 85  Vital Signs: Temp: 98.9 F (37.2 C) (06/10 0412) Temp Source: Oral (06/10 0412) BP: 161/69 (06/10 0412) Pulse Rate: 73 (06/10 0412)  Labs: Recent Labs    03/04/19 0420 03/05/19 0353 03/06/19 0312  HGB 11.4* 10.6* 10.2*  HCT 35.8* 32.7* 31.6*  PLT 81* 85* 89*  LABPROT 19.1* 22.6* 24.4*  INR 1.6* 2.0* 2.2*  CREATININE 1.71* 1.29* 1.10    Estimated Creatinine Clearance: 75.1 mL/min (by C-G formula based on SCr of 1.1 mg/dL).   Medical History: Past Medical History:  Diagnosis Date  . Allergic rhinitis   . BPH (benign prostatic hyperplasia)   . Chronic atrial fibrillation   . Diabetes mellitus ([place])   . DVT (deep venous thrombosis) ([facility])    LLE  . Hematochezia   . HTN (hypertension)   . Kidney transplant recipient   . PVD (peripheral vascular disease) [facility])    Assessment: 68 yo M Transfer from outside hospital with diarrhea/dyspnea. On warfarin PTA for history of DVT. Pt was briefly on heparin gtt on 6/7, now pharmacy consulted to manage warfarin therapy.  INR today 2.2  PTA warfarin regimen: 5mg  on Wed/Sun, 2.5mg  all other days  Note possible drug-drug interaction with Bactrim, however bactrim was also a PTA medication.  Goal of Therapy:  Heparin level 0.3-0.7 units/ml Monitor platelets by anticoagulation protocol: Yes   Plan:   Warfarin 2.5mg  PO x1  Daily INR, CBC  Makenli Derstine A. Levada Dy, PharmD, [place] Please utilize Amion for appropriate phone number to reach the unit pharmacist ([place])   03/06/2019       8:04 AM

## 2019-03-06 NOTE — Progress Notes (Signed)
Inpatient Rehabilitation-Admissions Coordinator    Met with patient at the bedside to discuss team's recommendation for inpatient rehabilitation. Shared booklets, expectations while in CIR, expected length of stay, and anticipated functional level at DC. Pt wants to pursue CIR at this time. AC called pt's wife to confirm DC plan and she is in agreement for CIR as well. AC will begin insurance authorization process for possible admit.   Jhonnie Garner, OTR/L  Rehab Admissions Coordinator  [phone] 03/06/2019 1:54 PM

## 2019-03-06 NOTE — Progress Notes (Signed)
Occupational Therapy Treatment Patient Details Name: Ryan Rice MRN: [id number] DOB: Sep 21, 1951 Today's Date: 03/06/2019    History of present illness Pt adm with respiratory failure due to PNA. Pt intubated 6/4 and extubated 6/7. PMH - ESRD with renal transplant, DM, HTN, DVT, PVD, gout   OT comments  Pt progressing toward established goals. Pt limited in participation this session secondary to pain and increased edema in BLE. Pt unable to bear weight through BLE due to pain. Pt currently requires minguard for lateral scoot transfers from EOB to recliner with drop arm.  Pt required max assistance for LB dressing. Pt is highly motivated to participate during session stating "Ill do whatever it takes". Pt continues to demonstrate qualities that would make him an excellent candidate for CIR. Will continue to follow acutely and progress pt as tolerated.     Follow Up Recommendations  CIR;Supervision/Assistance - 24 hour    Equipment Recommendations  Other (comment)(TBD at next venue)    Recommendations for Other Services      Precautions / Restrictions Precautions Precautions: Fall       Mobility Bed Mobility Overal bed mobility: Needs Assistance Bed Mobility: Supine to Sit     Supine to sit: Supervision;HOB elevated     General bed mobility comments: increased time;supervision for safety  Transfers Overall transfer level: Needs assistance   Transfers: Lateral/Scoot Transfers          Lateral/Scoot Transfers: Min guard General transfer comment: due to pain in bilateral feet, unable to come into standing this session;minguard for safety;pt scooted from EOB to recliner, DoE 2/4    Balance Overall balance assessment: Needs assistance Sitting-balance support: No upper extremity supported;Feet supported Sitting balance-Leahy Scale: Fair Sitting balance - Comments: able to lean over to reach toes while sitting EOB, no LOB noted                                   ADL either performed or assessed with clinical judgement   ADL Overall ADL's : Needs assistance/impaired                     Lower Body Dressing: Maximal assistance Lower Body Dressing Details (indicate cue type and reason): pt required assistance to don socks;pt able to reach to mid-foot when long-sitting in bed Toilet Transfer: Requires drop arm;Min guard Toilet Transfer Details (indicate cue type and reason): simulated transfer from EOB to recliner with drop arm, pt required minguard for safety with lateral scoot to recliner         Functional mobility during ADLs: Min guard;Minimal assistance       Vision       Perception     Praxis      Cognition Arousal/Alertness: Awake/alert Behavior During Therapy: WFL for tasks assessed/performed Overall Cognitive Status: No family/caregiver present to determine baseline cognitive functioning                                 General Comments: pt very appreciative to be out of bed and sitting in recliner         Exercises     Shoulder Instructions       General Comments VSS throughout session;noted increased edema in BLE L>R;pt reports increased pain level in bilateral feet, limiting participation during session;educated pt on importance of mobility to assist with edema managament  Pertinent Vitals/ Pain       Pain Assessment: Faces Faces Pain Scale: Hurts even more Pain Location: bilateral LE Pain Descriptors / Indicators: Discomfort;Guarding;Grimacing;Sore;Tender Pain Intervention(s): Limited activity within patient's tolerance;Monitored during session  Home Living                                          Prior Functioning/Environment              Frequency  Min 3X/week        Progress Toward Goals  OT Goals(current goals can now be found in the care plan section)  Progress towards OT goals: Progressing toward goals  Acute Rehab OT Goals Patient Stated  Goal: to fish and get back to his company ([employer]) OT Goal Formulation: With patient Time For Goal Achievement: 03/17/19 Potential to Achieve Goals: Good ADL Goals Pt Will Perform Grooming: with modified independence;standing Pt Will Perform Upper Body Dressing: with modified independence;sitting Pt Will Perform Lower Body Dressing: with modified independence;sitting/lateral leans;sit to/from stand Pt Will Perform Toileting - Clothing Manipulation and hygiene: with modified independence;sitting/lateral leans;sit to/from stand Additional ADL Goal #1: Pt to complete OOB functional tasks with set-upA and minguardA for mobility.  Plan Discharge plan remains appropriate    Co-evaluation    PT/OT/SLP Co-Evaluation/Treatment: Yes Reason for Co-Treatment: For patient/therapist safety;To address functional/ADL transfers   OT goals addressed during session: ADL's and self-care      AM-PAC OT "6 Clicks" Daily Activity     Outcome Measure   Help from another person eating meals?: A [place] Help from another person taking care of personal grooming?: A [place] Help from another person toileting, which includes using toliet, bedpan, or urinal?: A Lot Help from another person bathing (including washing, rinsing, drying)?: A [place] Help from another person to put on and taking off regular upper body clothing?: A [place] Help from another person to put on and taking off regular lower body clothing?: A Lot 6 Click Score: 16    End of Session    OT Visit Diagnosis: Unsteadiness on feet (R26.81);Muscle weakness (generalized) (M62.81);Other symptoms and signs involving cognitive function   Activity Tolerance Patient tolerated treatment well   Patient Left in chair;with call bell/phone within reach(pt verbalized he will not get up without staff present)   Nurse Communication Mobility status        Time: 3149-7026 OT Time Calculation (min): 31 min  Charges: OT General Charges $OT Visit: 1  Visit OT Treatments $Self Care/Home Management : 8-22 mins  Dorinda Hill OTR/L Acute Rehabilitation Services Office: [place] 03/06/2019, 4:09 PM

## 2019-03-06 NOTE — Progress Notes (Signed)
Ryan Rice Kitchen  PROGRESS NOTE    Ryan Rice  UVO:[id number] DOB: 1951-06-15 DOA: 02/27/2019 PCP: System, Provider Not In   Brief Narrative:   [name] a 67 y.o.malewho has a PMH including but not limited to ESRD s/p renal transplant, DM, HTN, HLD, DVT on coumadin. Hepresented to [facility] 6/3 with abdominal pain. Had diarrhea that started the night prior after he had cut his grass. Did report mild SOB and mild non productive cough. No fevers/chills/sweats.  In ED, he was found to be hypoxic with slightly increased WOB. He was placed on NRB to maintain SpO2 in 90s. CXR demonstrated LLL opacity. CT A/P demonstrated extensive PNA bilaterally L >R. Initial rapid COVID negative.  Due to increased O2 needs and concern for deterioration, he was transferred to [facility] for further evaluation and management.  Admitted to ICU.  Transferred to [facility] on 6/8.  Pt admitted by ICU for multifocal pneumonia.  He required mechanical ventilation and vasopressors for shock.  Now improved.  Pt transferred to [facility] on 6/8.  Completed course of abx.  Pending CIR.   Assessment & Plan:   Active Problems:   Sepsis ([place])   Acute renal failure (ARF) ([place])   Community acquired pneumonia of left lower lobe of lung ([place])   Kidney transplant recipient   Acinetobacter Bacteremia/Multifocal Pneumonia/Vent Dependent Resp Failure:      - Immunocompromised with hx renal transplant     - Blood cx 6/3 with acinetobacter (1/2 with coag negative staph, suspect contaminant)     - Cefepime 6/3 - 6/5; Unasyn 6/6 - 6/9; 7 day abx course completed, abx d/c'd; lets resume to complete 10 days     - Blood cx 6/8 NGTD     - afebrile, WBC stable     - continue steroid taper  Acute on CKD/Hx of Renal transplant:      - per renal note, creatinine 1.44 prior to admission.  Baseline ~1.7 per renal notes a few months ago.     - Peaked at 2.41; now resolved     - Appreciate renal recs     - Continue prograf and tapering stress dose  steroids     - Myfortic resumed     - baseline CKD stage unclear; but GFR suggests that he is CKD 2 or better  Acute Encephalopathy     - likely delirium 2/2 hospitalization     - appropriate interaction this AM  Hx DVT/Supratherapeutic INR on presentation     - s/p vitamin K and FFP at presentation due to supratherapeutic INR.       - Warfarin per pharmacy  Elevated Troponin:      - peaked at 0.34.      - thought 2/2 demand ischemia with sepsis in setting of aki on CKD.       - Echo with mildly reduced EF (was normal on 3/12).       - No comment on WMA.       - Had stress test on 12/06/2018 without evidence of ischemia.     - Will need outpatient cards f/u     - A1c (6.2), lipids (LDL <70) - on atorvastatin 10 at home  1.3 by 1.3 cm focus of decreased attenuation along leftward aspect of prostate:      - PSA is elevated     - Discussed with urology who note pt needs biopsy and outpatient urology follow up     - Urology will help with setting up  follow up, need to ensure he follows up as planned  Noncystic Mass Arising from native kidney on R:     - needs nonemergent MR of native kidney   Hypokalemia:     - replace and follow  Thrombocytopenia:     - continue to monitor  Left Ankle Pain:      - pt suspects gout flare, improving at this time.       - He's currently on steroids.       - CTM on steroids.       - Follow uric acid.       - L ankle plain film without acute abnormality.  Continue IV unasyn for another 3 days. Taper steroids. Working on [employer].   DVT prophylaxis: warfarin Code Status: FULL   Disposition Plan: To CIR after insurance approval   Consultants:   Nephrology  PCCM  Antimicrobials:   Unasyn IV 3g q6h    Subjective: "Is someone going to work with my foot?"  Objective: Vitals:   03/06/19 0412 03/06/19 0419 03/06/19 0852 03/06/19 1215  BP: (!) 161/69  (!) 155/75 120/67  Pulse: 73  67 89  Resp:   20 18  Temp:  98.9 F (37.2 C)   98.5 F (36.9 C)  TempSrc: Oral   Axillary  SpO2: 95%  98% 96%  Weight:  93.5 kg    Height:        Intake/Output Summary (Last 24 hours) at 03/06/2019 1539 Last data filed at 03/06/2019 1413 Gross per 24 hour  Intake 840 ml  Output 876 ml  Net -36 ml   Filed Weights   03/03/19 0400 03/04/19 0500 03/06/19 0419  Weight: 91 kg 91.1 kg 93.5 kg    Examination:  General: 68 y.o. male resting in bed in NAD Cardiovascular: RRR, +S1, S2, no m/g/r, equal pulses throughout Respiratory: CTABL, no w/r/r, normal WOB GI: BS+, NDNT, no masses noted, no organomegaly noted MSK: No c/c, BLE edema Skin: No rashes, bruises, ulcerations noted Neuro: A&O x 3, no focal deficits   Data Reviewed: I have personally reviewed following labs and imaging studies.  CBC: Recent Labs  Lab 03/02/19 0500 03/02/19 0508 03/03/19 0328 03/04/19 0420 03/05/19 0353 03/06/19 0312  WBC 12.9*  --  12.8* 11.1* 10.2 11.2*  NEUTROABS 5.3  --  11.7* 9.4* 8.3* 9.1*  HGB 10.1* 9.9* 10.6* 11.4* 10.6* 10.2*  HCT 31.0* 29.0* 33.4* 35.8* 32.7* 31.6*  MCV 84.0  --  85.6 84.8 85.2 84.7  PLT 70*  --  67* 81* 85* 89*   Basic Metabolic Panel: Recent Labs  Lab 03/02/19 0500  03/03/19 0328 03/03/19 2353 03/04/19 0420 03/05/19 0353 03/06/19 0312  NA 140   < > 142 141 141 140 140  K 3.4*   < > 3.7 3.5 3.2* 3.0* 3.3*  CL 108   < > 110 108 109 107 108  CO2 20*   < > 22 22 23 22 23   GLUCOSE 227*   < > 141* 192* 206* 171* 194*  BUN 53*   < > 60* 56* 53* 38* 27*  CREATININE 2.41*   < > 1.93* 1.82* 1.71* 1.29* 1.10  CALCIUM 7.5*   < > 8.1* 8.0* 8.2* 8.3* 8.5*  MG 1.9  --  2.4  --  2.1 1.8 1.6*  PHOS 2.2*  --  3.5  --  2.9 2.2* 1.8*   < > = values in this interval not displayed.   GFR: Estimated  Creatinine Clearance: 75.1 mL/min (by C-G formula based on SCr of 1.1 mg/dL). Liver Function Tests: Recent Labs  Lab 03/02/19 0500 03/03/19 0328 03/04/19 0420 03/05/19 0353 03/06/19 0312  ALBUMIN  2.1* 2.2* 2.3* 2.3* 2.2*   No results for input(s): LIPASE, AMYLASE in the last 168 hours. No results for input(s): AMMONIA in the last 168 hours. Coagulation Profile: Recent Labs  Lab 03/02/19 0500 03/03/19 0328 03/04/19 0420 03/05/19 0353 03/06/19 0312  INR 3.0* 1.9* 1.6* 2.0* 2.2*   Cardiac Enzymes: Recent Labs  Lab 02/27/19 1841 02/28/19 0331 02/28/19 0546 02/28/19 1355 03/03/19 0328  TROPONINI 0.25* 0.24* 0.34* 0.37* 0.23*   BNP (last 3 results) No results for input(s): PROBNP in the last 8760 hours. HbA1C: Recent Labs    03/04/19 0420  HGBA1C 6.2*   CBG: Recent Labs  Lab 03/05/19 1544 03/05/19 2049 03/06/19 0414 03/06/19 0810 03/06/19 1223  GLUCAP 195* 205* 172* 147* 183*   Lipid Profile: Recent Labs    03/05/19 0353  CHOL 108  HDL 28*  LDLCALC 65  TRIG 75  CHOLHDL 3.9   Thyroid Function Tests: No results for input(s): TSH, T4TOTAL, FREET4, T3FREE, THYROIDAB in the last 72 hours. Anemia Panel: No results for input(s): VITAMINB12, FOLATE, FERRITIN, TIBC, IRON, RETICCTPCT in the last 72 hours. Sepsis Labs: Recent Labs  Lab 02/27/19 1559 02/27/19 1830 02/28/19 0331 02/28/19 0546 03/01/19 0349 03/03/19 0032  PROCALCITON  --   --  71.92 147.88 >150.00  --   LATICACIDVEN 3.9* 4.0* 2.8*  --   --  1.4    Recent Results (from the past 240 hour(s))  Blood culture (routine x 2)     Status: Abnormal   Collection Time: 02/27/19  3:35 PM  Result Value Ref Range Status   Specimen Description   Final    BLOOD RIGHT ANTECUBITAL Performed at [facility], [place] [address] [place], [place] [address]    Special Requests   Final    BOTTLES DRAWN AEROBIC AND ANAEROBIC Blood Culture adequate volume Performed at [facility], [address] [place], [place] [address]    Culture  Setup Time   Final    GRAM NEGATIVE RODS AEROBIC BOTTLE ONLY CRITICAL VALUE NOTED.  VALUE IS CONSISTENT WITH PREVIOUSLY REPORTED AND CALLED VALUE.     Culture (A)  Final    ACINETOBACTER CALCOACETICUS/BAUMANNII COMPLEX SUSCEPTIBILITIES PERFORMED ON PREVIOUS CULTURE WITHIN THE LAST 5 DAYS. Performed at [facility], [place] [address], [place], [place] [address]    Report Status 03/02/2019 FINAL  Final  Blood culture (routine x 2)     Status: Abnormal   Collection Time: 02/27/19  4:00 PM  Result Value Ref Range Status   Specimen Description   Final    BLOOD RIGHT HAND Performed at [facility], [address] [place], [place] [address]    Special Requests   Final    BOTTLES DRAWN AEROBIC AND ANAEROBIC Blood Culture adequate volume Performed at [facility], [address] [place], [place] [address]    Culture  Setup Time   Final    GRAM NEGATIVE RODS AEROBIC BOTTLE ONLY GRAM POSITIVE COCCI CRITICAL RESULT CALLED TO, READ BACK BY AND VERIFIED WITH: PHARMD A LOVE [id number] AT 31 BY CM    Culture (A)  Final    ACINETOBACTER CALCOACETICUS/BAUMANNII COMPLEX STAPHYLOCOCCUS SPECIES (COAGULASE NEGATIVE) THE SIGNIFICANCE OF ISOLATING THIS ORGANISM FROM A SINGLE SET OF BLOOD CULTURES WHEN MULTIPLE SETS  ARE DRAWN IS UNCERTAIN. PLEASE NOTIFY THE MICROBIOLOGY DEPARTMENT WITHIN ONE WEEK IF SPECIATION AND SENSITIVITIES ARE REQUIRED. Performed at [facility], [place] [address] [place], [place] [address]    Report Status 03/02/2019 FINAL  Final   Organism ID, Bacteria ACINETOBACTER CALCOACETICUS/BAUMANNII COMPLEX  Final      Susceptibility   Acinetobacter calcoaceticus/baumannii complex - MIC*    CEFTAZIDIME 8 SENSITIVE Sensitive     CEFTRIAXONE 16 INTERMEDIATE Intermediate     CIPROFLOXACIN <=0.25 SENSITIVE Sensitive     GENTAMICIN <=1 SENSITIVE Sensitive     IMIPENEM <=0.25 SENSITIVE Sensitive     PIP/TAZO 8 SENSITIVE Sensitive     TRIMETH/SULFA <=20 SENSITIVE Sensitive     CEFEPIME 8 SENSITIVE Sensitive     AMPICILLIN/SULBACTAM <=2 SENSITIVE Sensitive     * ACINETOBACTER CALCOACETICUS/BAUMANNII COMPLEX    Blood Culture ID Panel (Reflexed)     Status: Abnormal   Collection Time: 02/27/19  4:00 PM  Result Value Ref Range Status   Enterococcus species NOT DETECTED NOT DETECTED Final   Listeria monocytogenes NOT DETECTED NOT DETECTED Final   Staphylococcus species DETECTED (A) NOT DETECTED Final    Comment: Methicillin (oxacillin) susceptible coagulase negative staphylococcus. Possible blood culture contaminant (unless isolated from more than one blood culture draw or clinical case suggests pathogenicity). No antibiotic treatment is indicated for blood  culture contaminants. CRITICAL RESULT CALLED TO, READ BACK BY AND VERIFIED WITH: PHARMD A LOVE [id number] AT 5176 BY CM    Staphylococcus aureus (BCID) NOT DETECTED NOT DETECTED Final   Methicillin resistance NOT DETECTED NOT DETECTED Final   Streptococcus species NOT DETECTED NOT DETECTED Final   Streptococcus agalactiae NOT DETECTED NOT DETECTED Final   Streptococcus pneumoniae NOT DETECTED NOT DETECTED Final   Streptococcus pyogenes NOT DETECTED NOT DETECTED Final   Acinetobacter baumannii NOT DETECTED NOT DETECTED Final   Enterobacteriaceae species NOT DETECTED NOT DETECTED Final   Enterobacter cloacae complex NOT DETECTED NOT DETECTED Final   Escherichia coli NOT DETECTED NOT DETECTED Final   Klebsiella oxytoca NOT DETECTED NOT DETECTED Final   Klebsiella pneumoniae NOT DETECTED NOT DETECTED Final   Proteus species NOT DETECTED NOT DETECTED Final   Serratia marcescens NOT DETECTED NOT DETECTED Final   Haemophilus influenzae NOT DETECTED NOT DETECTED Final   Neisseria meningitidis NOT DETECTED NOT DETECTED Final   Pseudomonas aeruginosa NOT DETECTED NOT DETECTED Final   Candida albicans NOT DETECTED NOT DETECTED Final   Candida glabrata NOT DETECTED NOT DETECTED Final   Candida krusei NOT DETECTED NOT DETECTED Final   Candida parapsilosis NOT DETECTED NOT DETECTED Final   Candida tropicalis NOT DETECTED NOT DETECTED Final    Comment:  Performed at [facility], [address], [place], [place] [address]  SARS Coronavirus 2 (Hosp order,Performed in [facility] lab via [employer])     Status: None   Collection Time: 02/27/19  4:15 PM  Result Value Ref Range Status   SARS Coronavirus 2 ([employer] Now) NEGATIVE NEGATIVE Final    Comment: (NOTE) Interpretive Result Comment(s): COVID 19 Positive SARS CoV 2 target nucleic acids are DETECTED. The SARS CoV 2 RNA is generally detectable in upper and lower respiratory specimens during the acute phase of infection.  Positive results are indicative of active infection with SARS CoV 2.  Clinical correlation with patient history and other diagnostic information is necessary to determine patient infection status.  Positive results do not rule out bacterial infection or coinfection with other viruses. The expected  result is Negative. COVID 19 Negative SARS CoV 2 target nucleic acids are NOT DETECTED. The SARS CoV 2 RNA is generally detectable in upper and lower respiratory specimens during the acute phase of infection.  Negative results do not preclude SARS CoV 2 infection, do not rule out coinfections with other pathogens, and should not be used as the sole basis for treatment or other patient management decisions.  Negative results must be combined with clinical  observations, patient history, and epidemiological information. The expected result is Negative. Invalid Presence or absence of SARS CoV 2 nucleic acids cannot be determined. Repeat testing was performed on the submitted specimen and repeated Invalid results were obtained.  If clinically indicated, additional testing on a new specimen with an alternate test methodology [phone]) is advised.  The SARS CoV 2 RNA is generally detectable in upper and lower respiratory specimens during the acute phase of infection. The expected result is Negative. Fact Sheet for Patients:   G[employer]ing[employer].no Fact Sheet for Healthcare Providers: https://www.hernandez-brewer.com/ This test is not yet approved or cleared by the [place] FDA and has been authorized for detection and/or diagnosis of SARS CoV 2 by FDA under an Emergency Use Authorization (EUA).  This EUA will remain in effect (meaning this test can be used) for the duration of the COVID19 d eclaration under Section 564(b)(1) of the Act, 21 U.S.C. section [phone] 3(b)(1), unless the authorization is terminated or revoked sooner. Performed at [facility], [address], [place], [place] [address]   Urine culture     Status: None   Collection Time: 02/27/19  5:25 PM  Result Value Ref Range Status   Specimen Description   Final    URINE, RANDOM Performed at [facility], [address] [place], [place] [address]    Special Requests   Final    NONE Performed at [facility], [place] [place], [place] [address]    Culture   Final    NO GROWTH Performed at [facility], [place] [address], [place], [place] [address]    Report Status 02/28/2019 FINAL  Final  MRSA PCR Screening     Status: None   Collection Time: 02/28/19 12:32 AM  Result Value Ref Range Status   MRSA by PCR NEGATIVE NEGATIVE Final    Comment:        The GeneXpert MRSA Assay (FDA approved for NASAL specimens only), is one component of a comprehensive MRSA colonization surveillance program. It is not intended to diagnose MRSA infection nor to guide or monitor treatment for MRSA infections. Performed at [facility], [place] [address], [place], [place] [address]   Novel Coronavirus, NAA (hospital order; send-out to ref lab)     Status: None   Collection Time: 02/28/19  1:10 AM  Result Value Ref Range Status   SARS-CoV-2, NAA NOT DETECTED NOT DETECTED Final    Comment: (NOTE) This test was developed and its performance characteristics determined by  [employer]. This test has not been FDA cleared or approved. This test has been authorized by FDA under an Emergency Use Authorization (EUA). This test is only authorized for the duration of time the declaration that circumstances exist justifying the authorization of the emergency use of in vitro diagnostic tests for detection of SARS-CoV-2 virus and/or diagnosis of COVID-19 infection under section 564(b)(1) of the Act, 21 U.S.C. 726OMB-5(D)(9), unless the authorization is terminated or revoked sooner. When diagnostic testing is  negative, the possibility of a false negative result should be considered in the context of a patient's recent exposures and the presence of clinical signs and symptoms consistent with COVID-19. An individual without symptoms of COVID-19 and who is not shedding SARS-CoV-2 virus would expect to have a negative (not detected) result in this assay. Performed  At: [facility] [address] [place], [place] [id number] Rush Farmer MD HL:[id number]    [place]  Final    Comment: Performed at [facility], [place] [address] [place], [place] [address]  Culture, blood (routine x 2)     Status: None (Preliminary result)   Collection Time: 03/04/19 10:35 AM  Result Value Ref Range Status   Specimen Description BLOOD RIGHT HAND  Final   Special Requests   Final    BOTTLES DRAWN AEROBIC ONLY Blood Culture adequate volume   Culture   Final    NO GROWTH 2 DAYS Performed at [facility], [place] [address], [place], [place] [address]    Report Status PENDING  Incomplete  Culture, blood (routine x 2)     Status: None (Preliminary result)   Collection Time: 03/04/19 10:45 AM  Result Value Ref Range Status   Specimen Description BLOOD RIGHT HAND  Final   Special Requests   Final    BOTTLES DRAWN AEROBIC ONLY Blood Culture adequate volume   Culture   Final    NO GROWTH 2 DAYS Performed at [facility], [place] [address], [place], [place] [address]    Report Status PENDING  Incomplete     Radiology Studies: Dg Ankle Complete Left  Result Date: 03/05/2019 CLINICAL DATA:  Soft tissue swelling and pain EXAM: LEFT ANKLE COMPLETE - 3+ VIEW COMPARISON:  None. FINDINGS: Frontal, oblique, and lateral views were obtained. There is no fracture or joint effusion. 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 No erosive changes evident. The ankle mortise appears intact. There is widespread arterial vascular calcification. IMPRESSION: Osteoarthritic change medially. No fracture. Ankle mortise appears intact. There is widespread arterial vascular calcification, likely due to known diabetes mellitus. Electronically Signed   By: Lowella Grip III M.D.   On: 03/05/2019 11:21      Scheduled Meds:  amLODipine  5 mg Oral Daily   atorvastatin  10 mg Oral q1800   chlorhexidine  15 mL Mouth Rinse BID   Chlorhexidine Gluconate Cloth  6 each Topical Q0600   Chlorhexidine Gluconate Cloth  6 each Topical Daily   famotidine  20 mg Oral Daily   feeding supplement (ENSURE ENLIVE)  237 mL Oral BID BM   folic acid  1 mg Oral Daily   insulin aspart  0-20 Units Subcutaneous Q4H   labetalol  100 mg Oral BID   mouth rinse  15 mL Mouth Rinse q12n4p   mycophenolate  360 mg Oral BID   predniSONE  40 mg Oral Q breakfast   sodium chloride flush  10-40 mL Intracatheter Q12H   sulfamethoxazole-trimethoprim  1 tablet Oral Q M,W,F   tacrolimus  2 mg Oral BID   tamsulosin  0.4 mg Oral Daily   thiamine  250 mg Oral Daily   warfarin  2.5 mg Oral ONCE-1800   Warfarin - Pharmacist Dosing Inpatient   Does not apply q1800   Continuous Infusions:  sodium chloride Stopped (03/03/19 1054)     LOS: 7 days    Time spent: 25 minutes spent in the coordination of care today.  Jonnie Finner, DO [facility] Pager [phone]  If 7PM-7AM, please contact night-coverage [email or web address] Password  [facility] 03/06/2019, 3:39 PM

## 2019-03-07 DIAGNOSIS — I10 Essential (primary) hypertension: Secondary | ICD-10-CM

## 2019-03-07 DIAGNOSIS — E876 Hypokalemia: Secondary | ICD-10-CM

## 2019-03-07 LAB — GLUCOSE, CAPILLARY
Glucose-Capillary: 171 mg/dL — ABNORMAL HIGH (ref 70–99)
Glucose-Capillary: 151 mg/dL — ABNORMAL HIGH (ref 70–99)
Glucose-Capillary: 239 mg/dL — ABNORMAL HIGH (ref 70–99)
Glucose-Capillary: 202 mg/dL — ABNORMAL HIGH (ref 70–99)
Glucose-Capillary: 216 mg/dL — ABNORMAL HIGH (ref 70–99)
Glucose-Capillary: 252 mg/dL — ABNORMAL HIGH (ref 70–99)

## 2019-03-07 LAB — CBC
HCT: 31.2 % — ABNORMAL LOW (ref 39.0–52.0)
Hemoglobin: 10 g/dL — ABNORMAL LOW (ref 13.0–17.0)
MCH: 27.3 pg (ref 26.0–34.0)
MCHC: 32.1 g/dL (ref 30.0–36.0)
MCV: 85.2 fL (ref 80.0–100.0)
Platelets: 87 10*3/uL — ABNORMAL LOW (ref 150–400)
RBC: 3.66 MIL/uL — ABNORMAL LOW (ref 4.22–5.81)
RDW: 15.9 % — ABNORMAL HIGH (ref 11.5–15.5)
WBC: 12.5 10*3/uL — ABNORMAL HIGH (ref 4.0–10.5)
nRBC: 0 % (ref 0.0–0.2)

## 2019-03-07 LAB — RENAL FUNCTION PANEL
Albumin: 2.2 g/dL — ABNORMAL LOW (ref 3.5–5.0)
Anion gap: 9 (ref 5–15)
BUN: 23 mg/dL (ref 8–23)
CO2: 21 mmol/L — ABNORMAL LOW (ref 22–32)
Calcium: 8.6 mg/dL — ABNORMAL LOW (ref 8.9–10.3)
Chloride: 109 mmol/L (ref 98–111)
Creatinine, Ser: 1.11 mg/dL (ref 0.61–1.24)
GFR calc Af Amer: >60 mL/min (ref >60)
GFR calc non Af Amer: >60 mL/min (ref >60)
Glucose, Bld: 192 mg/dL — ABNORMAL HIGH (ref 70–99)
Phosphorus: 1.7 mg/dL — ABNORMAL LOW (ref 2.5–4.6)
Potassium: 3.4 mmol/L — ABNORMAL LOW (ref 3.5–5.1)
Sodium: 139 mmol/L (ref 135–145)

## 2019-03-07 LAB — PROTIME-INR
INR: 2.6 — ABNORMAL HIGH (ref 0.8–1.2)
Prothrombin Time: 27.1 seconds — ABNORMAL HIGH (ref 11.4–15.2)

## 2019-03-07 LAB — MAGNESIUM: Magnesium: 1.3 mg/dL — ABNORMAL LOW (ref 1.7–2.4)

## 2019-03-07 MED ORDER — POTASSIUM & SODIUM PHOSPHATES 280-160-250 MG PO PACK: 1.0000 | PACK | Freq: Three times a day (TID) | ORAL | Status: DC

## 2019-03-07 MED ORDER — POTASSIUM & SODIUM PHOSPHATES 280-160-250 MG PO PACK
1.0000 | PACK | Freq: Three times a day (TID) | ORAL | Status: AC
  Administered 2019-03-07 – 2019-03-08 (×4): 1 via ORAL
  Filled 2019-03-07 (×5): qty 1

## 2019-03-07 MED ORDER — MAGNESIUM OXIDE 400 (241.3 MG) MG PO TABS
400.0000 mg | ORAL_TABLET | Freq: Every day | ORAL | Status: DC
  Administered 2019-03-07: 400 mg via ORAL
  Filled 2019-03-07: qty 1

## 2019-03-07 MED ORDER — WARFARIN SODIUM 2 MG PO TABS
2.0000 mg | ORAL_TABLET | Freq: Once | ORAL | Status: AC
  Administered 2019-03-07: 2 mg via ORAL
  Filled 2019-03-07: qty 1

## 2019-03-07 NOTE — PMR Pre-admission (Signed)
PMR Admission Coordinator Pre-Admission Assessment  Patient: Ryan Rice is an 68 y.o., male MRN: [id number] DOB: 03-Nov-1950 Height: 5' 11"  (180.3 cm) Weight: 95.6 kg              Insurance Information HMO: yes    PPO:      PCP:      IPA:      80/20:      OTHER:  PRIMARY: [employer]      Policy#: [id number]      Subscriber: Patient CM Name: Dawson Bills      Phone#: [phone]     Fax#: [phone] Pre-Cert#: [id number]     Employer:  Josem Kaufmann approval provided by Dawson Bills for admit to CIR on 6/12. Follow up CM is Lestine Box (p): [phone] [id number] (f): [phone] Updated clinicals are for DC planning only.  Benefits:  Phone #: online     Name: [email or web address] Eff. Date: 09/26/18 still active     Deduct: $0      Out of Pocket Max: $3,400 (met [phone])      Life Max: NA CIR: $295/day for days 1-6, $0/day for days 7+      SNF: $0/day for days 1-20, $178/day for days 21-100; limited to 100 days/cal yr Outpatient: limited by medical necessity      Co-Pay: $10-$40/visist pending service Home Health: 100%, limited by medical necessity      Co-Pay: 0% DME: 80%     Co-Pay: 20% Providers:  SECONDARY: None       Policy#:       Subscriber:  CM Name:       Phone#:      Fax#:  Pre-Cert#:       Employer:  Benefits:  Phone #:      Name:  Eff. Date:      Deduct:       Out of Pocket Max:       Life Max:  CIR:       SNF:  Outpatient:      Co-Pay:  Home Health:       Co-Pay:  DME:      Co-Pay:   Medicaid Application Date:       Case Manager:  Disability Application Date:       Case Worker:   The "Data Collection Information Summary" for patients in Inpatient Rehabilitation Facilities with attached "Privacy Act [place] Records" was provided and verbally reviewed with: Patient  Emergency Contact Information Contact Information    Name Relation Home Work Mobile   [place] Spouse   [phone]     Current Medical History  Patient Admitting Diagnosis: 68 yo male  with debility after pneumonia, sepsis. Previously active  History of Present Illness: Ryan Rice is a 69 y.o. male with history of HTN, DM, ESRD s/p renal transplant, and CAF. Pt was admitted on 02/27/19 with diarrhea and abdominal pain. He was found to be hypoxic with increased WOB due to extensive L> R PNA with sepsis. He was COVID negative. He was placed on BIPAP and started on broad spectrum.  He developed hypotension with worsening of respiratory status requiring pressors, stress dose steriods and intubation on 6/04.  Nephrology consulted due to acute on chronic renal failure and patient started on bicarb for metabolic acidosis with close monitoring of urine output. He was extubated 6/6 and tolerated a regular diet.  Renal status improved with increase in UOP.  Blood cultures positive for Acinetobacter and antibiotics narrowed to Unasyn.  Hospital course  significant for RUE edema, elevated troponin due to demand ischemia as well as incidental radiologic findings of mass on native kidney and mass on prostate. His mentation has improved but he continues to have hypoxia with activity, disorientation with difficulty processing as well as unsteady gait. Left ankle Xrays done due to pain and swelling and revealed medial OA with widespread arterial vascular calcifications.  CIR recommended due to debility. Pt is to be admitted to CIR on 03/08/19.    Glasgow Coma Scale Score: 15  Past Medical History  Past Medical History:  Diagnosis Date  . Allergic rhinitis   . BPH (benign prostatic hyperplasia)   . Chronic atrial fibrillation   . Diabetes mellitus ([place])   . DVT (deep venous thrombosis) ([facility])    LLE  . Hematochezia   . HTN (hypertension)   . Kidney transplant recipient   . PVD (peripheral vascular disease) ([place])     Family History  family history includes Diabetes in his brother, father, and sister; Heart disease in his brother and father; Stroke in his mother.  Prior Rehab/Hospitalizations:   Has the patient had prior rehab or hospitalizations prior to admission? No  Has the patient had major surgery during 100 days prior to admission? No  Current Medications   Current Facility-Administered Medications:  .  0.9 %  sodium chloride infusion, , Intravenous, PRN, Collene Gobble, MD, Stopped at 03/03/19 1054 .  amLODipine (NORVASC) tablet 5 mg, 5 mg, Oral, Daily, Elodia Florence., MD, 5 mg at 03/08/19 0853 .  Ampicillin-Sulbactam (UNASYN) 3 g in sodium chloride 0.9 % 100 mL IVPB, 3 g, Intravenous, Q6H, Kyle, Tyrone A, DO, Last Rate: 200 mL/hr at 03/08/19 0901, 3 g at 03/08/19 0901 .  atorvastatin (LIPITOR) tablet 10 mg, 10 mg, Oral, q1800, Desai, Rahul P, PA-C, 10 mg at 03/07/19 1717 .  benzonatate (TESSALON) capsule 100 mg, 100 mg, Oral, TID PRN, Marylyn Ishihara, Tyrone A, DO, 100 mg at 03/08/19 1001 .  chlorhexidine (PERIDEX) 0.12 % solution 15 mL, 15 mL, Mouth Rinse, BID, Ramaswamy, Murali, MD, 15 mL at 03/08/19 0854 .  Chlorhexidine Gluconate Cloth 2 % PADS 6 each, 6 each, Topical, Daily, Collene Gobble, MD, 6 each at 03/07/19 1000 .  famotidine (PEPCID) tablet 20 mg, 20 mg, Oral, Daily, Brand Males, MD, 20 mg at 03/08/19 0851 .  feeding supplement (ENSURE ENLIVE) (ENSURE ENLIVE) liquid 237 mL, 237 mL, Oral, BID BM, Elodia Florence., MD, 237 mL at 48/25/00 3704 .  folic acid (FOLVITE) tablet 1 mg, 1 mg, Oral, Daily, Brand Males, MD, 1 mg at 03/08/19 0851 .  hydrALAZINE (APRESOLINE) injection 10 mg, 10 mg, Intravenous, Q4H PRN, Anders Simmonds, MD, 10 mg at 03/04/19 0127 .  insulin aspart (novoLOG) injection 0-20 Units, 0-20 Units, Subcutaneous, Q4H, Desai, Rahul P, PA-C, 7 Units at 03/08/19 1246 .  labetalol (NORMODYNE) tablet 100 mg, 100 mg, Oral, BID, Anders Simmonds, MD, 100 mg at 03/08/19 0851 .  magnesium oxide (MAG-OX) tablet 400 mg, 400 mg, Oral, BID, Kyle, Tyrone A, DO, 400 mg at 03/08/19 0853 .  MEDLINE mouth rinse, 15 mL, Mouth Rinse, q12n4p, Ramaswamy,  Murali, MD, 15 mL at 03/08/19 1247 .  mycophenolate (MYFORTIC) EC tablet 360 mg, 360 mg, Oral, BID, Dwana Melena, MD, 360 mg at 03/08/19 0854 .  potassium & sodium phosphates (PHOS-NAK) 280-160-250 MG packet 1 packet, 1 packet, Oral, TID WC & HS, Kyle, Tyrone A, DO, 1 packet at 03/08/19 0855 .  predniSONE (DELTASONE) tablet 40 mg, 40 mg, Oral, Q breakfast, Elodia Florence., MD, 40 mg at 03/08/19 0853 .  sodium chloride flush (NS) 0.9 % injection 10-40 mL, 10-40 mL, Intracatheter, Q12H, Collene Gobble, MD, 10 mL at 03/07/19 0913 .  sodium chloride flush (NS) 0.9 % injection 10-40 mL, 10-40 mL, Intracatheter, PRN, Collene Gobble, MD, 20 mL at 03/01/19 2247 .  sulfamethoxazole-trimethoprim (BACTRIM) 400-80 MG per tablet 1 tablet, 1 tablet, Oral, Q M,W,F, Desai, Rahul P, PA-C, 1 tablet at 03/08/19 0854 .  tacrolimus (PROGRAF) capsule 2 mg, 2 mg, Oral, BID, Desai, Rahul P, PA-C, 2 mg at 03/08/19 0853 .  tamsulosin (FLOMAX) capsule 0.4 mg, 0.4 mg, Oral, Daily, Anders Simmonds, MD, 0.4 mg at 03/08/19 0853 .  thiamine (VITAMIN B-1) tablet 250 mg, 250 mg, Oral, Daily, Ramaswamy, Murali, MD, 250 mg at 03/08/19 0852 .  warfarin (COUMADIN) tablet 2 mg, 2 mg, Oral, ONCE-1800, Skeet Simmer, RPH .  Warfarin - Pharmacist Dosing Inpatient, , Does not apply, q1800, Rush Barer, RPH  Patients Current Diet:  Diet Order            Diet heart healthy/carb modified Room service appropriate? Yes; Fluid consistency: Thin  Diet effective now              Precautions / Restrictions Precautions Precautions: Fall Restrictions Weight Bearing Restrictions: No   Has the patient had 2 or more falls or a fall with injury in the past year?Yes  Prior Activity Level Community (5-7x/wk): active and independent PTA; used to own lawn service company   Prior Functional Level Prior Function Level of Independence: Independent Comments: Mowed yard on day of admit  Self Care: Did the patient need help  bathing, dressing, using the toilet or eating?  Independent  Indoor Mobility: Did the patient need assistance with walking from room to room (with or without device)? Independent  Stairs: Did the patient need assistance with internal or external stairs (with or without device)? Independent  Functional Cognition: Did the patient need help planning regular tasks such as shopping or remembering to take medications? Independent  Home Assistive Devices / Equipment Home Equipment: Walker - 2 wheels  Prior Device Use: Indicate devices/aids used by the patient prior to current illness, exacerbation or injury? None of the above  Current Functional Level Cognition  Overall Cognitive Status: No family/caregiver present to determine baseline cognitive functioning Current Attention Level: Sustained Orientation Level: Oriented X4 Following Commands: Follows one step commands with increased time Safety/Judgement: Decreased awareness of safety, Decreased awareness of deficits General Comments: pt very appreciative to be out of bed and sitting in recliner     Extremity Assessment (includes Sensation/Coordination)  Upper Extremity Assessment: Generalized weakness RUE Deficits / Details: weakness 3-/5 MM grade LUE Deficits / Details: Decreased strenth 2+/5 MM grade, edematous LUE Coordination: decreased fine motor, decreased gross motor  Lower Extremity Assessment: Defer to PT evaluation    ADLs  Overall ADL's : Needs assistance/impaired Grooming: Wash/dry hands, Wash/dry face, Set up, Sitting Upper Body Bathing: Minimal assistance, Sitting Lower Body Bathing: Maximal assistance, Sitting/lateral leans, Sit to/from stand, Cueing for sequencing, Cueing for safety Upper Body Dressing : Minimal assistance, Sitting Lower Body Dressing: Maximal assistance Lower Body Dressing Details (indicate cue type and reason): pt required assistance to don socks;pt able to reach to mid-foot when long-sitting in  bed Toilet Transfer: Requires drop arm, Min guard Toilet Transfer Details (indicate cue type and reason): simulated transfer from  EOB to recliner with drop arm, pt required minguard for safety with lateral scoot to recliner Toileting- Clothing Manipulation and Hygiene: Maximal assistance, +2 for physical assistance, +2 for safety/equipment, Sitting/lateral lean, Sit to/from stand Functional mobility during ADLs: Min guard, Minimal assistance General ADL Comments: Pt limited by weakness, poor attention, decreased cognitive abilities and decreased strength requiring assist and sequencing assist for ADL    Mobility  Overal bed mobility: Needs Assistance Bed Mobility: Supine to Sit Supine to sit: Supervision, HOB elevated General bed mobility comments: increased time;supervision for safety    Transfers  Overall transfer level: Needs assistance Equipment used: Rolling walker (2 wheeled) Transfers: Lateral/Scoot Transfers Sit to Stand: Max assist, +2 physical assistance, +2 safety/equipment, Mod assist Stand pivot transfers: Max assist, +2 physical assistance, +2 safety/equipment  Lateral/Scoot Transfers: Min guard General transfer comment: due to pain in bilateral feet, unable to come into standing this session;minguard for safety;pt scooted from EOB to recliner, DoE 2/4    Ambulation / Gait / Stairs / Wheelchair Mobility  Ambulation/Gait Ambulation/Gait assistance: Mod assist, +2 safety/equipment Gait Distance (Feet): 75 Feet Assistive device: Rolling walker (2 wheeled) Gait Pattern/deviations: Step-to pattern, Decreased step length - right, Decreased step length - left, Shuffle, Trunk flexed, Drifts right/left General Gait Details: Assist with balance and support. Close chair follow. Legs unstable. Pt did better the longer he walked.  Needed constant cues for upright posture.  Drifts left needing assist and cues to steer to right.  Gait velocity: decr Gait velocity interpretation: <1.31  ft/sec, indicative of household ambulator    Posture / Balance Dynamic Sitting Balance Sitting balance - Comments: able to lean over to reach toes while sitting EOB, no LOB noted Balance Overall balance assessment: Needs assistance Sitting-balance support: No upper extremity supported, Feet supported Sitting balance-Leahy Scale: Fair Sitting balance - Comments: able to lean over to reach toes while sitting EOB, no LOB noted Standing balance support: Bilateral upper extremity supported Standing balance-Leahy Scale: Poor Standing balance comment: RW and external support.  High level balance activites: Side stepping High Level Balance Comments: very difficult for pt to comprehend moving BLEs to right     Special needs/care consideration BiPAP/CPAP: no CPM: no Continuous Drip IV: no Dialysis: no  (but has had dialysis before prior to renal transplant)      Days: no Life Vest: no Oxygen: no Special Bed: no Trach Size: no Wound Vac (area): no     Location: no Skin: right arm skin tear                           Bowel mgmt: last BM: 03/06/19; continent Bladder mgmt: continent Diabetic mgmt: yes Behavioral consideration : no Chemo/radiation : no     Previous Home Environment (from acute therapy documentation) Living Arrangements: Spouse/significant other Available Help at Discharge: Family, Available 24 hours/day Type of Home: House Home Layout: One level Home Access: Stairs to enter Entrance Stairs-Rails: None Entrance Stairs-Number of Steps: 3 Bathroom Shower/Tub: Chiropodist: Standard  Discharge Living Setting Plans for Discharge Living Setting: Patient's home, House, Lives with (comment)(wife ) Type of Home at Discharge: House Discharge Home Layout: One level Discharge Home Access: Stairs to enter Entrance Stairs-Rails: None Entrance Stairs-Number of Steps: 5 Discharge Bathroom Shower/Tub: Walk-in shower Discharge Bathroom Toilet: Standard Discharge  Bathroom Accessibility: Yes How Accessible: Accessible via walker Does the patient have any problems obtaining your medications?: No  Social/Family/Support Systems Patient Roles: Spouse Contact Information:  wife: [name] [phone]) Anticipated Caregiver: wife Anticipated Caregiver's Contact Information: see above Ability/Limitations of Caregiver: Min A Caregiver Availability: 24/7 Discharge Plan Discussed with Primary Caregiver: Yes Is Caregiver In Agreement with Plan?: Yes Does Caregiver/Family have Issues with Lodging/Transportation while Pt is in Rehab?: No   Goals/Additional Needs Patient/Family Goal for Rehab: PT/OT: Mod I/Supervision; SLP: NA Expected length of stay: 11-16 days Cultural Considerations: [employer]  Dietary Needs: heart healthy/carb modified; thin liquids Equipment Needs: TBD Pt/Family Agrees to Admission and willing to participate: Yes Program Orientation Provided & Reviewed with Pt/Caregiver Including Roles  & Responsibilities: Yes(pt and his wife)  Barriers to Discharge: Home environment access/layout  Barriers to Discharge Comments: steps to enter   Decrease burden of Care through IP rehab admission: NA   Possible need for SNF placement upon discharge: not anticipated; pt has good family support who can assist him at DC as needed. Pt has an excellent prognosis for further progress through CIR.    Patient Condition: This patient's medical and functional status has changed since the consult dated: 03/05/19 in which the Rehabilitation Physician determined and documented that the patient's condition is appropriate for intensive rehabilitative care in an inpatient rehabilitation facility. See "History of Present Illness" (above) for medical update. Functional changes are: decline in transfer ability from Max A x2 for sit to stand to need to transition to lateral scoot transfers at [facility] due to ankle pain from potential gout. Pt has not been able to ambulate due to  acute ankle pain from suspected gout flare.  Patient's medical and functional status update has been discussed with the Rehabilitation physician and patient remains appropriate for inpatient rehabilitation. Will admit to inpatient rehab today, 03/08/19.  Preadmission Screen Completed By:  Jhonnie Garner, OT, 03/08/2019 1:43 PM ______________________________________________________________________   Discussed status with Dr. Naaman Plummer on 03/08/19 at 11:51AM and received approval for admission today.  Admission Coordinator:  Jhonnie Garner, time 1:42PM/Date 03/08/2019

## 2019-03-07 NOTE — Progress Notes (Signed)
Inpatient Diabetes Program Recommendations  AACE/ADA: New Consensus Statement on Inpatient Glycemic Control (2015)  Target Ranges:  Prepandial:   less than 140 mg/dL      Peak postprandial:   less than 180 mg/dL (1-2 hours)      Critically ill patients:  140 - 180 mg/dL   Results for JOSEANGEL, NETTLETON (MRN [id number]) as of 03/07/2019 10:27  Ref. Range 03/06/2019 08:10 03/06/2019 12:23 03/06/2019 17:11 03/06/2019 20:12 03/06/2019 23:51 03/07/2019 05:15 03/07/2019 08:27  Glucose-Capillary Latest Ref Range: 70 - 99 mg/dL 147 (H) 183 (H) 216 (H) 259 (H) 146 (H) 171 (H) 151 (H)   Review of Glycemic Control  Blood sugars past 24H - 142-267 mg/dL. Steroids down to Pred 40 mg QD On CHO mod diet.  Inpatient Diabetes Program Recommendations:      -  Decrease Novolog to 0-15 units tidwc and hs   -  Consider Novolog 2-3 units tidwc if post-prandials > 180 mg/dL  Will follow glucose trends.  Thank you. Tama Headings RN, MSN, BC-ADM Inpatient Diabetes Coordinator Team Pager [phone] (8a-5p)

## 2019-03-07 NOTE — Progress Notes (Signed)
[facility] for Warfarin Indication: History of DVT  Allergies  Allergen Reactions  . Iodinated Diagnostic Agents Hives  . Tape Hives    Patient Measurements: Height: 5\' 11"  (180.3 cm) Weight: 206 lb 2.1 oz (93.5 kg) IBW/kg (Calculated) : 75.3  Heparin dosing Wt: 85  Vital Signs: Temp: 99.1 F (37.3 C) (06/11 0509) Temp Source: Oral (06/11 0509) BP: 150/57 (06/11 0509) Pulse Rate: 82 (06/11 0509)  Labs: Recent Labs    03/05/19 0353 03/06/19 0312 03/07/19 0404  HGB 10.6* 10.2* 10.0*  HCT 32.7* 31.6* 31.2*  PLT 85* 89* 87*  LABPROT 22.6* 24.4* 27.1*  INR 2.0* 2.2* 2.6*  CREATININE 1.29* 1.10 1.11    Estimated Creatinine Clearance: 74.4 mL/min (by C-G formula based on SCr of 1.11 mg/dL).   Medical History: Past Medical History:  Diagnosis Date  . Allergic rhinitis   . BPH (benign prostatic hyperplasia)   . Chronic atrial fibrillation   . Diabetes mellitus ([place])   . DVT (deep venous thrombosis) ([facility])    LLE  . Hematochezia   . HTN (hypertension)   . Kidney transplant recipient   . PVD (peripheral vascular disease) [place])    Assessment: 68 yo M Transfer from outside hospital with diarrhea/dyspnea. On warfarin PTA for history of DVT. Pt was briefly on heparin gtt on 6/7, now pharmacy consulted to manage warfarin therapy.  INR today 2.2>>2.6, Significant increase on 2 days of 2.5mg . Will decrease dose.   PTA warfarin regimen: 5mg  on Wed/Sun, 2.5mg  all other days. INR on admit was 7.4  Note possible drug-drug interaction with Bactrim, however bactrim was also a PTA medication.  Goal of Therapy:  Heparin level 0.3-0.7 units/ml Monitor platelets by anticoagulation protocol: Yes   Plan:   Warfarin 2 mg PO x1  Daily INR, CBC  Kendel Bessey A. Levada Dy, PharmD, [place] Please utilize Amion for appropriate phone number to reach the unit pharmacist ([place])   03/07/2019       7:51 AM

## 2019-03-07 NOTE — Progress Notes (Signed)
Ryan Rice  PROGRESS NOTE    Bird Swetz  CLE:[id number] DOB: 21-Jul-1951 DOA: 02/27/2019 PCP: System, Provider Not In   Brief Narrative:   Ryan Rice a 68 y.o.malewho has a PMH including but not limited to ESRD s/p renal transplant, DM, HTN, HLD, DVT on coumadin. Hepresented to [facility] 6/3 with abdominal pain. Had diarrhea that started the night prior after he had cut his grass. Did report mild SOB and mild non productive cough. No fevers/chills/sweats.  In ED, he was found to be hypoxic with slightly increased WOB. He was placed on NRB to maintain SpO2 in 90s. CXR demonstrated LLL opacity. CT A/P demonstrated extensive PNA bilaterally L >R. Initial rapid COVID negative.  Due to increased O2 needs and concern for deterioration, he was transferred to [facility] for further evaluation and management. Admitted to ICU.  Transferred to [facility] on 6/8.  Pt admitted by ICU for multifocal pneumonia. He required mechanical ventilation and vasopressors for shock. Now improved. Pt transferred to [facility] on 6/8. Completed course of abx. Pending CIR.   Assessment & Plan:   Active Problems:   Sepsis ([place])   Acute renal failure (ARF) ([place])   Community acquired pneumonia of left lower lobe of lung ([place])   Kidney transplant recipient   Acinetobacter Bacteremia/Multifocal Pneumonia/Vent Dependent Resp Failure:      - Immunocompromised with hx renal transplant     - Blood cx 6/3 with acinetobacter (1/2 with coag negative staph, suspect contaminant)     - Cefepime 6/3 - 6/5; Unasyn 6/6 - 6/9; 7 day abx course completed, abx d/c'd; Unasyn resumed to complete 10 days     - Blood cx 6/8 NGTD     - afebrile, WBC stable     - continue steroid taper  Acute on CKD/Hx of Renal transplant:      - per renal note, creatinine 1.44 prior to admission.  Baseline ~1.7 per renal notes a few months ago.     - Peaked at 2.41; now resolved     - Appreciate renal recs     - Continue prograf and tapering stress  dose steroids     - Myfortic resumed     - baseline CKD stage unclear; but GFR suggests that he is CKD 2 or better  Acute Encephalopathy     - likely delirium 2/2 hospitalization     - appropriate interaction this AM  Hx DVT/Supratherapeutic INR on presentation     - s/p vitamin K and FFP at presentation due to supratherapeutic INR.       - Warfarin per pharmacy  Elevated Troponin:      - peaked at 0.34.      - thought 2/2 demand ischemia with sepsis in setting of aki on CKD.       - Echo with mildly reduced EF (was normal on 3/12).       - No comment on WMA.       - Had stress test on 12/06/2018 without evidence of ischemia.     - Will need outpatient cards f/u     - A1c (6.2), lipids (LDL <70) - on atorvastatin 10 at home  1.3 by 1.3 cm focus of decreased attenuation along leftward aspect of prostate:      - PSA is elevated     - Discussed with urology who note pt needs biopsy and outpatient urology follow up     - Urology will help with setting up follow up, need to ensure he  follows up as planned  Noncystic Mass Arising from native kidney on R:     - needs nonemergent MR of native kidney   Hypokalemia/Hypomagnesemia/hypophosphatemia:     - replace and follow  Thrombocytopenia:     - continue to monitor  Left Ankle Pain:      - pt suspects gout flare, improving at this time.       - He's currently on steroids.       - CTM on steroids.       - Follow uric acid.       - L ankle plain film without acute abnormality.  HTN     - amlodipine, labetolol; can increase amlodipine to 10mg   Working on CIR. Let's increase amlodipine to 10mg  daily. Replete Mg2+/K+/Phos. Unasyn through tomorrow. Otherwise, continue as above.   DVT prophylaxis: warfarin Code Status: FULL   Disposition Plan: To CIR after insurance approval   Consultants:   Nephrolog  PCCM   Antimicrobials:  . Unasyn IV 3g q6h    Subjective: "It's feeling better, but that one is still sore."   Objective: Vitals:   03/06/19 2114 03/07/19 0509 03/07/19 0702 03/07/19 0904  BP: (!) 148/64 (!) 150/57  (!) 167/65  Pulse: 65 82  79  Resp: 18 16    Temp: 98.4 F (36.9 C) 99.1 F (37.3 C)    TempSrc: Oral Oral    SpO2: 98% 97%    Weight:   93.5 kg   Height:        Intake/Output Summary (Last 24 hours) at 03/07/2019 1239 Last data filed at 03/07/2019 1005 Gross per 24 hour  Intake 700 ml  Output 400 ml  Net 300 ml   Filed Weights   03/04/19 0500 03/06/19 0419 03/07/19 0702  Weight: 91.1 kg 93.5 kg 93.5 kg    Examination:  General: 68 y.o. male resting in bed in NAD Cardiovascular: RRR, +S1, S2, no m/g/r, equal pulses throughout Respiratory: CTABL, no w/r/r, normal WOB GI: BS+, NDNT, no masses noted, no organomegaly noted MSK: No c/c, BLE edema Skin: No rashes, bruises, ulcerations noted Neuro: A&O x 3, no focal deficits    Data Reviewed: I have personally reviewed following labs and imaging studies.  CBC: Recent Labs  Lab 03/02/19 0500  03/03/19 0328 03/04/19 0420 03/05/19 0353 03/06/19 0312 03/07/19 0404  WBC 12.9*  --  12.8* 11.1* 10.2 11.2* 12.5*  NEUTROABS 5.3  --  11.7* 9.4* 8.3* 9.1*  --   HGB 10.1*   < > 10.6* 11.4* 10.6* 10.2* 10.0*  HCT 31.0*   < > 33.4* 35.8* 32.7* 31.6* 31.2*  MCV 84.0  --  85.6 84.8 85.2 84.7 85.2  PLT 70*  --  67* 81* 85* 89* 87*   < > = values in this interval not displayed.   Basic Metabolic Panel: Recent Labs  Lab 03/03/19 0328 03/03/19 2353 03/04/19 0420 03/05/19 0353 03/06/19 0312 03/07/19 0404  NA 142 141 141 140 140 139  K 3.7 3.5 3.2* 3.0* 3.3* 3.4*  CL 110 108 109 107 108 109  CO2 22 22 23 22 23  21*  GLUCOSE 141* 192* 206* 171* 194* 192*  BUN 60* 56* 53* 38* 27* 23  CREATININE 1.93* 1.82* 1.71* 1.29* 1.10 1.11  CALCIUM 8.1* 8.0* 8.2* 8.3* 8.5* 8.6*  MG 2.4  --  2.1 1.8 1.6* 1.3*  PHOS 3.5  --  2.9 2.2* 1.8* 1.7*   GFR: Estimated Creatinine Clearance: 74.4 mL/min (by C-G formula based  on SCr of 1.11  mg/dL). Liver Function Tests: Recent Labs  Lab 03/03/19 0328 03/04/19 0420 03/05/19 0353 03/06/19 0312 03/07/19 0404  ALBUMIN 2.2* 2.3* 2.3* 2.2* 2.2*   No results for input(s): LIPASE, AMYLASE in the last 168 hours. No results for input(s): AMMONIA in the last 168 hours. Coagulation Profile: Recent Labs  Lab 03/03/19 0328 03/04/19 0420 03/05/19 0353 03/06/19 0312 03/07/19 0404  INR 1.9* 1.6* 2.0* 2.2* 2.6*   Cardiac Enzymes: Recent Labs  Lab 02/28/19 1355 03/03/19 0328  TROPONINI 0.37* 0.23*   BNP (last 3 results) No results for input(s): PROBNP in the last 8760 hours. HbA1C: No results for input(s): HGBA1C in the last 72 hours. CBG: Recent Labs  Lab 03/06/19 2012 03/06/19 2351 03/07/19 0515 03/07/19 0827 03/07/19 1202  GLUCAP 259* 146* 171* 151* 239*   Lipid Profile: Recent Labs    03/05/19 0353  CHOL 108  HDL 28*  LDLCALC 65  TRIG 75  CHOLHDL 3.9   Thyroid Function Tests: No results for input(s): TSH, T4TOTAL, FREET4, T3FREE, THYROIDAB in the last 72 hours. Anemia Panel: No results for input(s): VITAMINB12, FOLATE, FERRITIN, TIBC, IRON, RETICCTPCT in the last 72 hours. Sepsis Labs: Recent Labs  Lab 03/01/19 0349 03/03/19 0032  PROCALCITON >150.00  --   LATICACIDVEN  --  1.4    Recent Results (from the past 240 hour(s))  Blood culture (routine x 2)     Status: Abnormal   Collection Time: 02/27/19  3:35 PM   Specimen: BLOOD  Result Value Ref Range Status   Specimen Description   Final    BLOOD RIGHT ANTECUBITAL Performed at [facility], [place] [address] [place], [place] [address]    Special Requests   Final    BOTTLES DRAWN AEROBIC AND ANAEROBIC Blood Culture adequate volume Performed at [facility], [address] [place], [place] [address]    Culture  Setup Time   Final    GRAM NEGATIVE RODS AEROBIC BOTTLE ONLY CRITICAL VALUE NOTED.  VALUE IS CONSISTENT WITH PREVIOUSLY REPORTED AND CALLED VALUE.    Culture (A)   Final    ACINETOBACTER CALCOACETICUS/BAUMANNII COMPLEX SUSCEPTIBILITIES PERFORMED ON PREVIOUS CULTURE WITHIN THE LAST 5 DAYS. Performed at [facility], [place] [address], [place], [place] [address]    Report Status 03/02/2019 FINAL  Final  Blood culture (routine x 2)     Status: Abnormal   Collection Time: 02/27/19  4:00 PM   Specimen: BLOOD RIGHT HAND  Result Value Ref Range Status   Specimen Description   Final    BLOOD RIGHT HAND Performed at [facility], [address] [place], [place] [address]    Special Requests   Final    BOTTLES DRAWN AEROBIC AND ANAEROBIC Blood Culture adequate volume Performed at [facility], [address] [place], [place] [address]    Culture  Setup Time   Final    GRAM NEGATIVE RODS AEROBIC BOTTLE ONLY GRAM POSITIVE COCCI CRITICAL RESULT CALLED TO, READ BACK BY AND VERIFIED WITH: PHARMD A LOVE [id number] AT 43 BY CM    Culture (A)  Final    ACINETOBACTER CALCOACETICUS/BAUMANNII COMPLEX STAPHYLOCOCCUS SPECIES (COAGULASE NEGATIVE) THE SIGNIFICANCE OF ISOLATING THIS ORGANISM FROM A SINGLE SET OF BLOOD CULTURES WHEN MULTIPLE SETS ARE DRAWN IS UNCERTAIN. PLEASE NOTIFY THE MICROBIOLOGY DEPARTMENT WITHIN ONE WEEK IF SPECIATION AND SENSITIVITIES ARE REQUIRED. Performed at [facility], [place] [address], [place], [place] [address]  Report Status 03/02/2019 FINAL  Final   Organism ID, Bacteria ACINETOBACTER CALCOACETICUS/BAUMANNII COMPLEX  Final      Susceptibility   Acinetobacter calcoaceticus/baumannii complex - MIC*    CEFTAZIDIME 8 SENSITIVE Sensitive     CEFTRIAXONE 16 INTERMEDIATE Intermediate     CIPROFLOXACIN <=0.25 SENSITIVE Sensitive     GENTAMICIN <=1 SENSITIVE Sensitive     IMIPENEM <=0.25 SENSITIVE Sensitive     PIP/TAZO 8 SENSITIVE Sensitive     TRIMETH/SULFA <=20 SENSITIVE Sensitive     CEFEPIME 8 SENSITIVE Sensitive     AMPICILLIN/SULBACTAM <=2 SENSITIVE Sensitive     * ACINETOBACTER  CALCOACETICUS/BAUMANNII COMPLEX  Blood Culture ID Panel (Reflexed)     Status: Abnormal   Collection Time: 02/27/19  4:00 PM  Result Value Ref Range Status   Enterococcus species NOT DETECTED NOT DETECTED Final   Listeria monocytogenes NOT DETECTED NOT DETECTED Final   Staphylococcus species DETECTED (A) NOT DETECTED Final    Comment: Methicillin (oxacillin) susceptible coagulase negative staphylococcus. Possible blood culture contaminant (unless isolated from more than one blood culture draw or clinical case suggests pathogenicity). No antibiotic treatment is indicated for blood  culture contaminants. CRITICAL RESULT CALLED TO, READ BACK BY AND VERIFIED WITH: PHARMD A LOVE [id number] AT 5277 BY CM    Staphylococcus aureus (BCID) NOT DETECTED NOT DETECTED Final   Methicillin resistance NOT DETECTED NOT DETECTED Final   Streptococcus species NOT DETECTED NOT DETECTED Final   Streptococcus agalactiae NOT DETECTED NOT DETECTED Final   Streptococcus pneumoniae NOT DETECTED NOT DETECTED Final   Streptococcus pyogenes NOT DETECTED NOT DETECTED Final   Acinetobacter baumannii NOT DETECTED NOT DETECTED Final   Enterobacteriaceae species NOT DETECTED NOT DETECTED Final   Enterobacter cloacae complex NOT DETECTED NOT DETECTED Final   Escherichia coli NOT DETECTED NOT DETECTED Final   Klebsiella oxytoca NOT DETECTED NOT DETECTED Final   Klebsiella pneumoniae NOT DETECTED NOT DETECTED Final   Proteus species NOT DETECTED NOT DETECTED Final   Serratia marcescens NOT DETECTED NOT DETECTED Final   Haemophilus influenzae NOT DETECTED NOT DETECTED Final   Neisseria meningitidis NOT DETECTED NOT DETECTED Final   Pseudomonas aeruginosa NOT DETECTED NOT DETECTED Final   Candida albicans NOT DETECTED NOT DETECTED Final   Candida glabrata NOT DETECTED NOT DETECTED Final   Candida krusei NOT DETECTED NOT DETECTED Final   Candida parapsilosis NOT DETECTED NOT DETECTED Final   Candida tropicalis NOT DETECTED NOT  DETECTED Final    Comment: Performed at [facility], [address] [place], [place] [address]  SARS Coronavirus 2 (Hosp order,Performed in [facility] lab via [employer] ID)     Status: None   Collection Time: 02/27/19  4:15 PM   Specimen: Dry Nasal Swab (Abbott ID Now)  Result Value Ref Range Status   SARS Coronavirus 2 (A[employer]ott ID Now) NEGATIVE NEGATIVE Final    Comment: (NOTE) Interpretive Result Comment(s): COVID 19 Positive SARS CoV 2 target nucleic acids are DETECTED. The SARS CoV 2 RNA is generally detectable in upper and lower respiratory specimens during the acute phase of infection.  Positive results are indicative of active infection with SARS CoV 2.  Clinical correlation with patient history and other diagnostic information is necessary to determine patient infection status.  Positive results do not rule out bacterial infection or coinfection with other viruses. The expected result is Negative. COVID 19 Negative SARS CoV 2 target nucleic acids are NOT DETECTED. The SARS CoV 2 RNA is generally detectable in upper and  lower respiratory specimens during the acute phase of infection.  Negative results do not preclude SARS CoV 2 infection, do not rule out coinfections with other pathogens, and should not be used as the sole basis for treatment or other patient management decisions.  Negative results must be combined with clinical  observations, patient history, and epidemiological information. The expected result is Negative. Invalid Presence or absence of SARS CoV 2 nucleic acids cannot be determined. Repeat testing was performed on the submitted specimen and repeated Invalid results were obtained.  If clinically indicated, additional testing on a new specimen with an alternate test methodology [phone]) is advised.  The SARS CoV 2 RNA is generally detectable in upper and lower respiratory specimens during the acute phase of infection. The expected result is  Negative. Fact Sheet for Patients:  GolfingFamily.no Fact Sheet for Healthcare Providers: https://www.hernandez-brewer.com/ This test is not yet approved or cleared by the [place] FDA and has been authorized for detection and/or diagnosis of SARS CoV 2 by FDA under an Emergency Use Authorization (EUA).  This EUA will remain in effect (meaning this test can be used) for the duration of the COVID19 d eclaration under Section 564(b)(1) of the Act, 21 U.S.C. section 201 631 4211 3(b)(1), unless the authorization is terminated or revoked sooner. Performed at [facility], [address] [place], [place] [address]   Urine culture     Status: None   Collection Time: 02/27/19  5:25 PM   Specimen: Urine, Random  Result Value Ref Range Status   Specimen Description   Final    URINE, RANDOM Performed at [facility], [address] [place], [place] [address]    Special Requests   Final    NONE Performed at [facility], [address] [place], [place] [address]    Culture   Final    NO GROWTH Performed at [facility], [place] [address], [place], [place] [address]    Report Status 02/28/2019 FINAL  Final  MRSA PCR Screening     Status: None   Collection Time: 02/28/19 12:32 AM   Specimen: Nasal Mucosa; Nasopharyngeal  Result Value Ref Range Status   MRSA by PCR NEGATIVE NEGATIVE Final    Comment:        The GeneXpert MRSA Assay (FDA approved for NASAL specimens only), is one component of a comprehensive MRSA colonization surveillance program. It is not intended to diagnose MRSA infection nor to guide or monitor treatment for MRSA infections. Performed at [facility], [place] [address] [place], [place] [address]   Novel Coronavirus, NAA (hospital order; send-out to ref lab)     Status: None   Collection Time: 02/28/19  1:10 AM  Result Value Ref Range Status   SARS-CoV-2, NAA NOT DETECTED NOT DETECTED  Final    Comment: (NOTE) This test was developed and its performance characteristics determined by [employer]. This test has not been FDA cleared or approved. This test has been authorized by FDA under an Emergency Use Authorization (EUA). This test is only authorized for the duration of time the declaration that circumstances exist justifying the authorization of the emergency use of in vitro diagnostic tests for detection of SARS-CoV-2 virus and/or diagnosis of COVID-19 infection under section 564(b)(1) of the Act, 21 U.S.C. 779TJQ-3(E)(0), unless the authorization is terminated or revoked sooner. When diagnostic testing is negative, the possibility of a false negative result should be considered in the context of  a patient's recent exposures and the presence of clinical signs and symptoms consistent with COVID-19. An individual without symptoms of COVID-19 and who is not shedding SARS-CoV-2 virus would expect to have a negative (not detected) result in this assay. Performed  At: [facility] [address] [place], [place] [id number] Rush Farmer MD WC:[id number]    [place]  Final    Comment: Performed at [facility], [place] [address], [place], [place] [address]  Culture, blood (routine x 2)     Status: None (Preliminary result)   Collection Time: 03/04/19 10:35 AM   Specimen: BLOOD RIGHT HAND  Result Value Ref Range Status   Specimen Description BLOOD RIGHT HAND  Final   Special Requests   Final    BOTTLES DRAWN AEROBIC ONLY Blood Culture adequate volume   Culture   Final    NO GROWTH 3 DAYS Performed at [facility], [place] [address] [place], [place] [address]    Report Status PENDING  Incomplete  Culture, blood (routine x 2)     Status: None (Preliminary result)   Collection Time: 03/04/19 10:45 AM   Specimen: BLOOD RIGHT HAND  Result Value Ref Range Status   Specimen Description BLOOD RIGHT HAND  Final   Special  Requests   Final    BOTTLES DRAWN AEROBIC ONLY Blood Culture adequate volume   Culture   Final    NO GROWTH 3 DAYS Performed at [facility], [place] [address] [place], [place] [address]    Report Status PENDING  Incomplete      Scheduled Meds: . amLODipine  5 mg Oral Daily  . atorvastatin  10 mg Oral q1800  . chlorhexidine  15 mL Mouth Rinse BID  . Chlorhexidine Gluconate Cloth  6 each Topical Q0600  . Chlorhexidine Gluconate Cloth  6 each Topical Daily  . famotidine  20 mg Oral Daily  . feeding supplement (ENSURE ENLIVE)  237 mL Oral BID BM  . folic acid  1 mg Oral Daily  . insulin aspart  0-20 Units Subcutaneous Q4H  . labetalol  100 mg Oral BID  . mouth rinse  15 mL Mouth Rinse q12n4p  . mycophenolate  360 mg Oral BID  . predniSONE  40 mg Oral Q breakfast  . sodium chloride flush  10-40 mL Intracatheter Q12H  . sulfamethoxazole-trimethoprim  1 tablet Oral Q M,W,F  . tacrolimus  2 mg Oral BID  . tamsulosin  0.4 mg Oral Daily  . thiamine  250 mg Oral Daily  . warfarin  2 mg Oral ONCE-1800  . Warfarin - Pharmacist Dosing Inpatient   Does not apply q1800   Continuous Infusions: . sodium chloride Stopped (03/03/19 1054)  . ampicillin-sulbactam (UNASYN) IV 3 g (03/07/19 0902)     LOS: 8 days    Time spent: 25 minutes spent in the coordination of care today.     Jonnie Finner, DO [facility] Pager [phone]  If 7PM-7AM, please contact night-coverage [email or web address] Password [facility] 03/07/2019, 12:39 PM

## 2019-03-08 ENCOUNTER — Other Ambulatory Visit: Payer: Self-pay

## 2019-03-08 ENCOUNTER — Encounter (HOSPITAL_COMMUNITY): Payer: Self-pay | Admitting: *Deleted

## 2019-03-08 ENCOUNTER — Inpatient Hospital Stay (HOSPITAL_COMMUNITY)
Admission: RE | Admit: 2019-03-08 | Discharge: 2019-03-16 | DRG: 945 | Disposition: A | Discharge status: Home Health | Payer: Medicare HMO | Source: Intra-hospital | Attending: Physical Medicine & Rehabilitation | Admitting: Physical Medicine & Rehabilitation

## 2019-03-08 DIAGNOSIS — R5381 Other malaise: Principal | ICD-10-CM | POA: Diagnosis present

## 2019-03-08 DIAGNOSIS — I482 Chronic atrial fibrillation, unspecified: Secondary | ICD-10-CM | POA: Diagnosis present

## 2019-03-08 DIAGNOSIS — Z940 Kidney transplant status: Secondary | ICD-10-CM | POA: Diagnosis not present

## 2019-03-08 DIAGNOSIS — E1169 Type 2 diabetes mellitus with other specified complication: Secondary | ICD-10-CM

## 2019-03-08 DIAGNOSIS — Z7901 Long term (current) use of anticoagulants: Secondary | ICD-10-CM

## 2019-03-08 DIAGNOSIS — M109 Gout, unspecified: Secondary | ICD-10-CM | POA: Diagnosis present

## 2019-03-08 DIAGNOSIS — M1000 Idiopathic gout, unspecified site: Secondary | ICD-10-CM | POA: Diagnosis not present

## 2019-03-08 DIAGNOSIS — H1132 Conjunctival hemorrhage, left eye: Secondary | ICD-10-CM | POA: Diagnosis not present

## 2019-03-08 DIAGNOSIS — M10062 Idiopathic gout, left knee: Secondary | ICD-10-CM

## 2019-03-08 DIAGNOSIS — G8929 Other chronic pain: Secondary | ICD-10-CM

## 2019-03-08 DIAGNOSIS — Z7984 Long term (current) use of oral hypoglycemic drugs: Secondary | ICD-10-CM | POA: Diagnosis not present

## 2019-03-08 DIAGNOSIS — Z91041 Radiographic dye allergy status: Secondary | ICD-10-CM | POA: Diagnosis not present

## 2019-03-08 DIAGNOSIS — Z91048 Other nonmedicinal substance allergy status: Secondary | ICD-10-CM

## 2019-03-08 DIAGNOSIS — N4289 Other specified disorders of prostate: Secondary | ICD-10-CM | POA: Diagnosis present

## 2019-03-08 DIAGNOSIS — Z79899 Other long term (current) drug therapy: Secondary | ICD-10-CM | POA: Diagnosis not present

## 2019-03-08 DIAGNOSIS — Z833 Family history of diabetes mellitus: Secondary | ICD-10-CM | POA: Diagnosis not present

## 2019-03-08 DIAGNOSIS — N2889 Other specified disorders of kidney and ureter: Secondary | ICD-10-CM | POA: Diagnosis present

## 2019-03-08 DIAGNOSIS — Z87891 Personal history of nicotine dependence: Secondary | ICD-10-CM | POA: Diagnosis not present

## 2019-03-08 DIAGNOSIS — D62 Acute posthemorrhagic anemia: Secondary | ICD-10-CM | POA: Diagnosis present

## 2019-03-08 DIAGNOSIS — Y92239 Unspecified place in hospital as the place of occurrence of the external cause: Secondary | ICD-10-CM | POA: Diagnosis not present

## 2019-03-08 DIAGNOSIS — E119 Type 2 diabetes mellitus without complications: Secondary | ICD-10-CM

## 2019-03-08 DIAGNOSIS — I10 Essential (primary) hypertension: Secondary | ICD-10-CM | POA: Diagnosis not present

## 2019-03-08 DIAGNOSIS — I872 Venous insufficiency (chronic) (peripheral): Secondary | ICD-10-CM | POA: Diagnosis present

## 2019-03-08 DIAGNOSIS — Z6826 Body mass index (BMI) 26.0-26.9, adult: Secondary | ICD-10-CM

## 2019-03-08 DIAGNOSIS — J189 Pneumonia, unspecified organism: Secondary | ICD-10-CM

## 2019-03-08 DIAGNOSIS — E1151 Type 2 diabetes mellitus with diabetic peripheral angiopathy without gangrene: Secondary | ICD-10-CM | POA: Diagnosis present

## 2019-03-08 DIAGNOSIS — Z8249 Family history of ischemic heart disease and other diseases of the circulatory system: Secondary | ICD-10-CM | POA: Diagnosis not present

## 2019-03-08 DIAGNOSIS — Z823 Family history of stroke: Secondary | ICD-10-CM | POA: Diagnosis not present

## 2019-03-08 DIAGNOSIS — N400 Enlarged prostate without lower urinary tract symptoms: Secondary | ICD-10-CM | POA: Diagnosis present

## 2019-03-08 DIAGNOSIS — Z7952 Long term (current) use of systemic steroids: Secondary | ICD-10-CM

## 2019-03-08 DIAGNOSIS — Z86718 Personal history of other venous thrombosis and embolism: Secondary | ICD-10-CM

## 2019-03-08 DIAGNOSIS — H409 Unspecified glaucoma: Secondary | ICD-10-CM

## 2019-03-08 DIAGNOSIS — E1165 Type 2 diabetes mellitus with hyperglycemia: Secondary | ICD-10-CM | POA: Diagnosis not present

## 2019-03-08 DIAGNOSIS — R600 Localized edema: Secondary | ICD-10-CM | POA: Diagnosis not present

## 2019-03-08 DIAGNOSIS — T45515A Adverse effect of anticoagulants, initial encounter: Secondary | ICD-10-CM | POA: Diagnosis not present

## 2019-03-08 DIAGNOSIS — R05 Cough: Secondary | ICD-10-CM | POA: Diagnosis not present

## 2019-03-08 DIAGNOSIS — G9340 Encephalopathy, unspecified: Secondary | ICD-10-CM | POA: Diagnosis present

## 2019-03-08 DIAGNOSIS — E669 Obesity, unspecified: Secondary | ICD-10-CM

## 2019-03-08 LAB — GLUCOSE, CAPILLARY
Glucose-Capillary: 154 mg/dL — ABNORMAL HIGH (ref 70–99)
Glucose-Capillary: 211 mg/dL — ABNORMAL HIGH (ref 70–99)
Glucose-Capillary: 306 mg/dL — ABNORMAL HIGH (ref 70–99)
Glucose-Capillary: 316 mg/dL — ABNORMAL HIGH (ref 70–99)
Glucose-Capillary: 344 mg/dL — ABNORMAL HIGH (ref 70–99)
Glucose-Capillary: 92 mg/dL (ref 70–99)

## 2019-03-08 LAB — CBC
HCT: 30.6 % — ABNORMAL LOW (ref 39.0–52.0)
Hemoglobin: 9.9 g/dL — ABNORMAL LOW (ref 13.0–17.0)
MCH: 27.8 pg (ref 26.0–34.0)
MCHC: 32.4 g/dL (ref 30.0–36.0)
MCV: 86 fL (ref 80.0–100.0)
Platelets: 106 10*3/uL — ABNORMAL LOW (ref 150–400)
RBC: 3.56 MIL/uL — ABNORMAL LOW (ref 4.22–5.81)
RDW: 16 % — ABNORMAL HIGH (ref 11.5–15.5)
WBC: 12.4 10*3/uL — ABNORMAL HIGH (ref 4.0–10.5)
nRBC: 0.2 % (ref 0.0–0.2)

## 2019-03-08 LAB — RENAL FUNCTION PANEL
Albumin: 2 g/dL — ABNORMAL LOW (ref 3.5–5.0)
Anion gap: 8 (ref 5–15)
BUN: 19 mg/dL (ref 8–23)
CO2: 22 mmol/L (ref 22–32)
Calcium: 8.7 mg/dL — ABNORMAL LOW (ref 8.9–10.3)
Chloride: 109 mmol/L (ref 98–111)
Creatinine, Ser: 1.02 mg/dL (ref 0.61–1.24)
GFR calc Af Amer: >60 mL/min (ref >60)
GFR calc non Af Amer: >60 mL/min (ref >60)
Glucose, Bld: 158 mg/dL — ABNORMAL HIGH (ref 70–99)
Phosphorus: 2.3 mg/dL — ABNORMAL LOW (ref 2.5–4.6)
Potassium: 3.7 mmol/L (ref 3.5–5.1)
Sodium: 139 mmol/L (ref 135–145)

## 2019-03-08 LAB — PROTIME-INR
INR: 2.6 — ABNORMAL HIGH (ref 0.8–1.2)
Prothrombin Time: 27.3 seconds — ABNORMAL HIGH (ref 11.4–15.2)

## 2019-03-08 LAB — MAGNESIUM: Magnesium: 1.4 mg/dL — ABNORMAL LOW (ref 1.7–2.4)

## 2019-03-08 MED ORDER — LABETALOL HCL 100 MG PO TABS: 100.0000 mg | ORAL_TABLET | Freq: Two times a day (BID) | ORAL | Status: DC

## 2019-03-08 MED ORDER — BENZONATATE 100 MG PO CAPS
100.0000 mg | ORAL_CAPSULE | Freq: Three times a day (TID) | ORAL | Status: DC | PRN
  Administered 2019-03-08: 100 mg via ORAL
  Filled 2019-03-08: qty 1

## 2019-03-08 MED ORDER — ORAL CARE MOUTH RINSE
15.0000 mL | Freq: Two times a day (BID) | OROMUCOSAL | Status: DC
  Administered 2019-03-09 – 2019-03-15 (×11): 15 mL via OROMUCOSAL

## 2019-03-08 MED ORDER — PROCHLORPERAZINE EDISYLATE 10 MG/2ML IJ SOLN: 5.0000 mg | Freq: Four times a day (QID) | INTRAMUSCULAR | Status: DC | PRN

## 2019-03-08 MED ORDER — INSULIN ASPART 100 UNIT/ML ~~LOC~~ SOLN
0.0000 [IU] | SUBCUTANEOUS | Status: DC
  Administered 2019-03-08: 21:00:00 15 [IU] via SUBCUTANEOUS
  Administered 2019-03-09 (×2): 4 [IU] via SUBCUTANEOUS
  Administered 2019-03-09: 3 [IU] via SUBCUTANEOUS
  Administered 2019-03-09: 11 [IU] via SUBCUTANEOUS
  Administered 2019-03-09: 3 [IU] via SUBCUTANEOUS
  Administered 2019-03-10: 15 [IU] via SUBCUTANEOUS
  Administered 2019-03-10 (×2): 3 [IU] via SUBCUTANEOUS
  Administered 2019-03-10: 11 [IU] via SUBCUTANEOUS
  Administered 2019-03-10: 4 [IU] via SUBCUTANEOUS
  Administered 2019-03-10: 11 [IU] via SUBCUTANEOUS
  Administered 2019-03-11: 7 [IU] via SUBCUTANEOUS
  Administered 2019-03-11: 15 [IU] via SUBCUTANEOUS
  Administered 2019-03-11 (×2): 7 [IU] via SUBCUTANEOUS
  Administered 2019-03-12: 3 [IU] via SUBCUTANEOUS

## 2019-03-08 MED ORDER — THIAMINE HCL 250 MG PO TABS: 250.0000 mg | ORAL_TABLET | Freq: Every day | ORAL | Status: DC

## 2019-03-08 MED ORDER — ACETAMINOPHEN 325 MG PO TABS: 325.0000 mg | ORAL_TABLET | ORAL | Status: DC | PRN

## 2019-03-08 MED ORDER — TAMSULOSIN HCL 0.4 MG PO CAPS
0.4000 mg | ORAL_CAPSULE | Freq: Every day | ORAL | Status: DC
  Administered 2019-03-09 – 2019-03-16 (×8): 0.4 mg via ORAL
  Filled 2019-03-08 (×8): qty 1

## 2019-03-08 MED ORDER — ATORVASTATIN CALCIUM 10 MG PO TABS
10.0000 mg | ORAL_TABLET | Freq: Every day | ORAL | Status: DC
  Administered 2019-03-09 – 2019-03-15 (×7): 10 mg via ORAL
  Filled 2019-03-08 (×7): qty 1

## 2019-03-08 MED ORDER — MYCOPHENOLATE SODIUM 180 MG PO TBEC
360.0000 mg | DELAYED_RELEASE_TABLET | Freq: Two times a day (BID) | ORAL | Status: DC
  Administered 2019-03-08 – 2019-03-16 (×16): 360 mg via ORAL
  Filled 2019-03-08 (×16): qty 2

## 2019-03-08 MED ORDER — ALUMINUM HYDROXIDE GEL 320 MG/5ML PO SUSP
30.0000 mL | Freq: Four times a day (QID) | ORAL | Status: DC | PRN
  Filled 2019-03-08: qty 30

## 2019-03-08 MED ORDER — CHLORHEXIDINE GLUCONATE CLOTH 2 % EX PADS: 6.0000 | MEDICATED_PAD | Freq: Every day | CUTANEOUS | Status: DC

## 2019-03-08 MED ORDER — CHLORHEXIDINE GLUCONATE 0.12 % MT SOLN
15.0000 mL | Freq: Two times a day (BID) | OROMUCOSAL | Status: DC
  Administered 2019-03-08 – 2019-03-16 (×15): 15 mL via OROMUCOSAL
  Filled 2019-03-08 (×14): qty 15

## 2019-03-08 MED ORDER — FOLIC ACID 1 MG PO TABS
1.0000 mg | ORAL_TABLET | Freq: Every day | ORAL | Status: DC
  Administered 2019-03-09 – 2019-03-16 (×8): 1 mg via ORAL
  Filled 2019-03-08 (×8): qty 1

## 2019-03-08 MED ORDER — WARFARIN SODIUM 2 MG PO TABS
2.0000 mg | ORAL_TABLET | Freq: Once | ORAL | Status: AC
  Filled 2019-03-08: qty 1

## 2019-03-08 MED ORDER — POTASSIUM & SODIUM PHOSPHATES 280-160-250 MG PO PACK: 1.0000 | PACK | Freq: Three times a day (TID) | ORAL | Status: DC

## 2019-03-08 MED ORDER — FAMOTIDINE 20 MG PO TABS
20.0000 mg | ORAL_TABLET | Freq: Every day | ORAL | Status: DC
  Administered 2019-03-09 – 2019-03-16 (×8): 20 mg via ORAL
  Filled 2019-03-08 (×8): qty 1

## 2019-03-08 MED ORDER — PROCHLORPERAZINE 25 MG RE SUPP: 12.5000 mg | Freq: Four times a day (QID) | RECTAL | Status: DC | PRN

## 2019-03-08 MED ORDER — POLYETHYLENE GLYCOL 3350 17 G PO PACK: 17.0000 g | PACK | Freq: Every day | ORAL | Status: DC | PRN

## 2019-03-08 MED ORDER — SULFAMETHOXAZOLE-TRIMETHOPRIM 400-80 MG PO TABS
1.0000 | ORAL_TABLET | ORAL | Status: DC
  Administered 2019-03-11 – 2019-03-15 (×3): 1 via ORAL
  Filled 2019-03-08 (×3): qty 1

## 2019-03-08 MED ORDER — BENZONATATE 100 MG PO CAPS: 100.0000 mg | ORAL_CAPSULE | Freq: Three times a day (TID) | ORAL | 0 refills | Status: DC | PRN

## 2019-03-08 MED ORDER — AMLODIPINE BESYLATE 5 MG PO TABS
5.0000 mg | ORAL_TABLET | Freq: Every day | ORAL | Status: DC
  Administered 2019-03-09 – 2019-03-16 (×8): 5 mg via ORAL
  Filled 2019-03-08 (×8): qty 1

## 2019-03-08 MED ORDER — AMLODIPINE BESYLATE 5 MG PO TABS: 5.0000 mg | ORAL_TABLET | Freq: Every day | ORAL | Status: DC

## 2019-03-08 MED ORDER — TRAZODONE HCL 50 MG PO TABS
25.0000 mg | ORAL_TABLET | Freq: Every evening | ORAL | Status: DC | PRN
  Administered 2019-03-13 – 2019-03-15 (×2): 25 mg via ORAL
  Filled 2019-03-08: qty 1

## 2019-03-08 MED ORDER — BISACODYL 10 MG RE SUPP: 10.0000 mg | Freq: Every day | RECTAL | Status: DC | PRN

## 2019-03-08 MED ORDER — PREDNISONE 20 MG PO TABS
40.0000 mg | ORAL_TABLET | Freq: Every day | ORAL | Status: DC
  Administered 2019-03-09 – 2019-03-10 (×2): 40 mg via ORAL
  Filled 2019-03-08 (×2): qty 2

## 2019-03-08 MED ORDER — WARFARIN SODIUM 2 MG PO TABS
2.0000 mg | ORAL_TABLET | Freq: Once | ORAL | Status: DC
  Administered 2019-03-08: 2 mg via ORAL
  Filled 2019-03-08: qty 1

## 2019-03-08 MED ORDER — SODIUM CHLORIDE 0.9% FLUSH: 10.0000 mL | INTRAVENOUS | Status: DC | PRN

## 2019-03-08 MED ORDER — BENZONATATE 100 MG PO CAPS: 100.0000 mg | ORAL_CAPSULE | Freq: Three times a day (TID) | ORAL | Status: DC | PRN

## 2019-03-08 MED ORDER — TACROLIMUS 1 MG PO CAPS
2.0000 mg | ORAL_CAPSULE | Freq: Two times a day (BID) | ORAL | Status: DC
  Administered 2019-03-08 – 2019-03-16 (×16): 2 mg via ORAL
  Filled 2019-03-08 (×16): qty 2

## 2019-03-08 MED ORDER — VITAMIN B-1 100 MG PO TABS
250.0000 mg | ORAL_TABLET | Freq: Every day | ORAL | Status: DC
  Administered 2019-03-09 – 2019-03-16 (×8): 250 mg via ORAL
  Filled 2019-03-08 (×8): qty 3

## 2019-03-08 MED ORDER — SODIUM CHLORIDE 0.9 % IV SOLN: 3.0000 g | Freq: Four times a day (QID) | INTRAVENOUS | Status: DC

## 2019-03-08 MED ORDER — MAGNESIUM OXIDE 400 (241.3 MG) MG PO TABS
400.0000 mg | ORAL_TABLET | Freq: Two times a day (BID) | ORAL | Status: DC
  Administered 2019-03-08: 400 mg via ORAL
  Filled 2019-03-08: qty 1

## 2019-03-08 MED ORDER — GUAIFENESIN-DM 100-10 MG/5ML PO SYRP: 5.0000 mL | ORAL_SOLUTION | Freq: Four times a day (QID) | ORAL | Status: DC | PRN

## 2019-03-08 MED ORDER — PROCHLORPERAZINE MALEATE 5 MG PO TABS: 5.0000 mg | ORAL_TABLET | Freq: Four times a day (QID) | ORAL | Status: DC | PRN

## 2019-03-08 MED ORDER — MILK AND MOLASSES ENEMA
1.0000 | Freq: Every day | RECTAL | Status: DC | PRN
  Filled 2019-03-08 (×2): qty 240

## 2019-03-08 MED ORDER — MAGNESIUM OXIDE 400 (241.3 MG) MG PO TABS
400.0000 mg | ORAL_TABLET | Freq: Two times a day (BID) | ORAL | Status: DC
  Administered 2019-03-08 – 2019-03-16 (×16): 400 mg via ORAL
  Filled 2019-03-08 (×16): qty 1

## 2019-03-08 MED ORDER — WARFARIN - PHARMACIST DOSING INPATIENT
Freq: Every day | Status: DC
  Administered 2019-03-11 – 2019-03-14 (×2)

## 2019-03-08 MED ORDER — ENSURE ENLIVE PO LIQD
237.0000 mL | Freq: Two times a day (BID) | ORAL | Status: DC
  Administered 2019-03-09 – 2019-03-16 (×13): 237 mL via ORAL

## 2019-03-08 MED ORDER — SODIUM CHLORIDE 0.9% FLUSH
10.0000 mL | Freq: Two times a day (BID) | INTRAVENOUS | Status: DC
  Administered 2019-03-08 – 2019-03-12 (×5): 10 mL

## 2019-03-08 MED ORDER — SODIUM CHLORIDE 0.9 % IV SOLN
3.0000 g | Freq: Four times a day (QID) | INTRAVENOUS | Status: AC
  Administered 2019-03-08 – 2019-03-09 (×4): 3 g via INTRAVENOUS
  Filled 2019-03-08 (×5): qty 3

## 2019-03-08 MED ORDER — DIPHENHYDRAMINE HCL 12.5 MG/5ML PO ELIX: 12.5000 mg | ORAL_SOLUTION | Freq: Four times a day (QID) | ORAL | Status: DC | PRN

## 2019-03-08 NOTE — Progress Notes (Signed)
ANTICOAGULATION CONSULT NOTE - Follow Up Consult  Pharmacy Consult for Warfarin Indication: hx of DVT  Allergies  Allergen Reactions  . Iodinated Diagnostic Agents Hives  . Tape Hives    Patient Measurements: Height: 5\' 11"  (180.3 cm) Weight: 210 lb 12.2 oz (95.6 kg) IBW/kg (Calculated) : 75.3  Vital Signs: Temp: 98.4 F (36.9 C) (06/12 0500) Temp Source: Oral (06/12 0500) BP: 146/66 (06/12 0500) Pulse Rate: 67 (06/12 0500)  Labs: Recent Labs    03/06/19 0312 03/07/19 0404 03/08/19 0504  HGB 10.2* 10.0* 9.9*  HCT 31.6* 31.2* 30.6*  PLT 89* 87* 106*  LABPROT 24.4* 27.1* 27.3*  INR 2.2* 2.6* 2.6*  CREATININE 1.10 1.11 1.02    Estimated Creatinine Clearance: 81.8 mL/min (by C-G formula based on SCr of 1.02 mg/dL).  Assessment:  68 yo M Transfer from outside hospital with diarrhea/dyspnea. On warfarin PTA for history of DVT. Pt was briefly on heparin gtt on 6/7, now pharmacy consulted to manage warfarin therapy.      INR today remains therapeutic (2.6) after slight decrease in dose (2.5>2 mg) yesterday.   Thrombocytopenia, platelet count trended up some. No bleeding reported.      PTA warfarin regimen: 5 mg on Wed/Sun, 2.5 mg all other days.  INR was 7.4 on admit.    Note possible drug-drug interaction with Bactrim, however bactrim was also a PTA medication.  Goal of Therapy:  INR 2-3 Monitor platelets by anticoagulation protocol: Yes   Plan:   Repeat Warfarin 2 mg today.  Daily PT/INR.  Arty Baumgartner, [place] Pager: [phone] or phone: [phone] 03/08/2019,10:50 AM

## 2019-03-08 NOTE — Evaluation (Signed)
Occupational Therapy Assessment and Plan  Patient Details  Name: Ryan Rice MRN: [id number] Date of Birth: November 08, 1950  OT Diagnosis: muscle weakness (generalized) and decreased activity tolerance Rehab Potential: Rehab Potential (ACUTE ONLY): Excellent ELOS: 9-12   Today's Date: 03/09/2019  Session 1 OT Individual Time: 0710-0803 OT Individual Time Calculation (min): 53 min     Session 2 OT Individual Time: 3474-2595 OT Individual Time Calculation (min): 56 min     Problem List:  Patient Active Problem List   Diagnosis Date Noted  . Debility 03/08/2019  . Acute renal failure (ARF) ([place])   . Community acquired pneumonia of left lower lobe of lung ([place])   . Kidney transplant recipient   . Sepsis ([place]) 02/27/2019    Past Medical History:  Past Medical History:  Diagnosis Date  . Allergic rhinitis   . BPH (benign prostatic hyperplasia)   . Chronic atrial fibrillation   . Diabetes mellitus ([place])   . DVT (deep venous thrombosis) ([facility])    LLE  . Hematochezia   . HTN (hypertension)   . Kidney transplant recipient   . PVD (peripheral vascular disease) ([place])    Past Surgical History:  Past Surgical History:  Procedure Laterality Date  . KIDNEY TRANSPLANT      Assessment & Plan Clinical Impression: Patient is a 68 y.o. year old male with recent admission to the hospital on 02/27/19 with diarrhea and abdominal pain. He was found to be hypoxic with increased WOB due to extensive L> R PNA with sepsis. He was COVID negative. He was placed on BIPAP and started on broad spectrum.  He developed hypotension with worsening of respiratory status requiring pressors, stress dose steriods and intubation on 6/04.  Nephrology consulted due to acute on chronic renal failure and patient started on bicarb for metabolic acidosis with close monitoring of urine output. He was extubated 6/6 and tolerated a regular diet.  Renal status improved with increase in UOP.  Blood cultures positive for  Acinetobacter and antibiotics narrowed to Unasyn.  Hospital course significant for RUE edema, elevated troponin due to demand ischemia as well as incidental radiologic findings of mass on native kidney and mass on prostate. His mentation has improved but he continues to have hypoxia with activity, disorientation with difficulty processing as well as unsteady gait. Left ankle Xrays done due to pain and swelling and revealed medial OA with widespread arterial vascular calcification. Patient transferred to CIR on 03/08/2019 .    Patient currently requires min/mod with basic self-care skills secondary to muscle weakness, decreased cardiorespiratoy endurance and decreased standing balance and decreased balance strategies.  Prior to hospitalization, patient could complete BADL with independent .  Patient will benefit from skilled intervention to increase independence with basic self-care skills prior to discharge home with care partner.  Anticipate patient will require intermittent supervision and follow up home health.  OT - End of Session Endurance Deficit: Yes Endurance Deficit Description: multiple rest breaks within BADL tasks OT Assessment Rehab Potential (ACUTE ONLY): Excellent OT Patient demonstrates impairments in the following area(s): Balance;Endurance;Edema;Motor;Pain;Sensory OT Basic ADL's Functional Problem(s): Grooming;Bathing;Dressing;Toileting OT Transfers Functional Problem(s): Toilet;Tub/Shower OT Additional Impairment(s): None OT Plan OT Intensity: Minimum of 1-2 x/day, 45 to 90 minutes OT Frequency: 5 out of 7 days OT Duration/Estimated Length of Stay: 9-12 OT Treatment/Interventions: Balance/vestibular training;Community reintegration;Discharge planning;Disease mangement/prevention;DME/adaptive equipment instruction;Functional mobility training;Pain management;Patient/family education;Psychosocial support;Self Care/advanced ADL retraining;Therapeutic Activities;Therapeutic  Exercise;UE/LE Strength taining/ROM;UE/LE Coordination activities OT Basic Self-Care Anticipated Outcome(s): mod I OT Toileting Anticipated Outcome(s): mod  I OT Bathroom Transfers Anticipated Outcome(s): mod I/supervision OT Recommendation Recommendations for Other Services: Therapeutic Recreation consult Therapeutic Recreation Interventions: Outing/community reintergration(see eval note) Patient destination: Home Follow Up Recommendations: Home health OT Equipment Recommended: To be determined   Skilled Therapeutic Intervention Session 1 Pt greeted semi-reclined in bed and excited for OT treatment session. OT eval completed addressing rehab process, OT purpose, POC, ELOS, and goals. Pt reported no pain in LEs today! Pt came to sitting EOB w/ CGA. Sit<>stand with min/mod A without AD. Pt took 5 steps to wc with mod A and still limping on LLE although reports no pain. Stand-pivot to commode with heavy use of grab bars and min A. Pt voided bowel and bladder and needed assistance for clothing management, but able to complete peri-care. Pt ambulated to shower with min A and use of grab bars. Pt needed assistance to wash feet and buttocks. Pt completed dressing tasks from wc with assistance for socks and min A for balance when pulling up pants. Poor shoulder strength with difficulty maintaining shoulder flexion to brush hair. Pt left seated in wc at end of session with chair alarm on and needs met.   Session 2 Pt greeted semi-reclined in bed and agreeable to OT treatment session. Pt came to standing w/ RW and min A. Pt ambulated 10 feet to door where wc was placed w/ RW and min A. UB strengthening and endurance with Sci[employer]It arm bike on level 2, 5 min. Pt took 2 minute rest break, then placed on level 3 for 5 more mins. Rest break, then 5 more minutes on level 4. Continued working on UB there-ex with dowel rod. 3 sets of 10 straight arm raises, bicep curls, and seated rows using level 2 orange theraband.  Incorporated pt preferred gospel music throughout session.  Pt returned to room and completed stand-pivot back to bed with min A. Pt left semi-reclined with bed alarm on and needs met.   OT Evaluation Precautions/Restrictions  Precautions Precautions: Fall Restrictions Weight Bearing Restrictions: No Pain Pain Assessment Pain Scale: 0-10 Pain Score: 0-No pain Pain Intervention(s): Other (Comment)(therapy to tolerance) Home Living/Prior [place] expects to be discharged to:: Private residence Living Arrangements: Spouse/significant other Available Help at Discharge: Available 24 hours/day, Family(wife available 24hr and can provide physical assist) Type of Home: House Home Access: Stairs to enter, Other (comment) Entrance Stairs-Number of Steps: 1STE back door and able ot use RW (main entryway) and 5STE without handrails on front enttrance Entrance Stairs-Rails: None Home Layout: One level Bathroom Shower/Tub: Gaffer, Chiropodist: Standard Additional Comments: no DME  Lives With: Spouse, Family(wife and granddaughter (17y.o.)) IADL History Homemaking Responsibilities: Yes Current License: Yes Prior Function Level of Independence: Independent with basic ADLs, Independent with homemaking with ambulation(reports he was mowing his yard day prior to hospitalization)  Able to Take Stairs?: Reciprically Driving: Yes 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, 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 Leisure: Hobbies-yes (Comment) Comments: enjoys fishing ADL ADL Eating: Independent Grooming: Minimal assistance Upper Body Bathing: Minimal assistance Lower Body Bathing: Moderate assistance Upper Body Dressing: Minimal assistance Lower Body Dressing: Moderate assistance Toileting: Minimal assistance Toilet Transfer: Moderate assistance Tub/Shower Transfer: Moderate assistance Vision Baseline  Vision/History: Wears glasses Wears Glasses: At all times Vision Assessment?: No apparent visual deficits Perception  Perception: Within Functional Limits Praxis Praxis: Intact Cognition Overall Cognitive Status: Within Functional Limits for tasks assessed Arousal/Alertness: Awake/alert Orientation Level: Person;Place;Situation Person: Oriented Place: Oriented Situation:  Oriented Year: 2020 Month: June Day of Week: Correct Memory: Appears intact Immediate Memory Recall: Sock;Blue;Bed Memory Recall: Sock;Blue;Bed Memory Recall Sock: Without Cue Memory Recall Blue: Without Cue Memory Recall Bed: Without Cue Attention: Focused;Sustained Focused Attention: Appears intact Sustained Attention: Appears intact Awareness: Appears intact Safety/Judgment: Appears intact Sensation Sensation Light Touch: Impaired Detail Peripheral sensation comments: impaired peripheral sensation in B feet (L>R) Hot/Cold: Not tested Stereognosis: Not tested Coordination Gross Motor Movements are Fluid and Coordinated: No Fine Motor Movements are Fluid and Coordinated: No Coordination and Movement Description: decreased smootness and accuracy 2/2 generalized weakness Finger Nose Finger Test: WNL Motor  Motor Motor - Skilled Clinical Observations: generalized weakness Mobility  Bed Mobility Bed Mobility: Supine to Sit Supine to Sit: Minimal Assistance - Patient > 75%  Balance Balance Balance Assessed: Yes Dynamic Sitting Balance Dynamic Sitting - Balance Support: Feet supported;During functional activity Dynamic Sitting - Level of Assistance: 5: Stand by assistance Static Standing Balance Static Standing - Balance Support: During functional activity;No upper extremity supported Static Standing - Level of Assistance: 4: Min assist Dynamic Standing Balance Dynamic Standing - Balance Support: During functional activity Dynamic Standing - Level of Assistance: 4: Min assist Extremity/Trunk  Assessment RUE Assessment RUE Assessment: Exceptions to [facility] General Strength Comments: 3/5 overall, weakest at shoulders RUE Strength Right Shoulder Flexion: 3/5 LUE Assessment LUE Assessment: Exceptions to [facility] General Strength Comments: 3/5 overall- weakest at shoulders LUE Strength Left Shoulder Flexion: 3-/5    Refer to Care Plan for Long Term Goals  Recommendations for other services: Therapeutic Recreation  Outing/community reintegration; active and independent PTA. Very enthusiastic and wants to get back to doing things he enjoyed like fishing and mowing the yard.    Discharge Criteria: Patient will be discharged from OT if patient refuses treatment 3 consecutive times without medical reason, if treatment goals not met, if there is a change in medical status, if patient makes no progress towards goals or if patient is discharged from hospital.  The above assessment, treatment plan, treatment alternatives and goals were discussed and mutually agreed upon: by patient  Valma Cava 03/09/2019, 2:32 PM

## 2019-03-08 NOTE — Progress Notes (Signed)
Meredith Staggers, MD  Physician  Physical Medicine and Rehabilitation  Consult Note  Signed  Date of Service:  03/05/2019 10:37 AM      Related encounter: ED to Hosp-Admission (Current) from 02/27/2019 in [facility]      Signed      Expand All Collapse All            Physical Medicine and Rehabilitation Consult     Reason for Consult: Debility Referring Physician: Dr. Florene Glen     HPI: Ryan Rice is a 68 y.o. male with history of HTN, DM, ESRD s/p renal transplant, CAF who was admitted on 02/27/19 with diarrhea and abdominal pain. He was found to be hypoxic with increased WOB due to extensive L> R PNA with sepsis. He was COVID negative. He was placed on BIPAP and started on broad spectrum.  He developed hypotension with worsening of respiratory status requiring pressors, stress dose steriods and intubation on 6/04.  Nephrology consulted due to acute on chronic renal failure and patient started on bicarb for metabolic acidosis with close monitoring of urine output. He was tolerated extubation by 6/6 and on regular diet.  Renal status improving with increase in UOP.  Blood cultures positive for Acinetobacter and antibiotics narrowed to Unasyn.  Hospital course significant for RUE edema, elevated troponin due to demand ischemia as well as incidental radiologic findings of mass on native kidney and mass on prostate. His mentation improving but he continues to have hypoxia with activity, disorientation with difficulty processing as well as unsteady gait. Left ankle Xrays done due to pain and swelling and revealed medial OA with widespread arterial vascular calcifications.  CIR recommended due to debility.       Review of Systems  Constitutional: Negative for chills and fever.  HENT: Negative for hearing loss and tinnitus.   Eyes: Negative for blurred vision and double vision.  Respiratory: Negative for cough and shortness of breath.   Cardiovascular: Negative for  chest pain, palpitations and leg swelling.  Gastrointestinal: Negative for heartburn and nausea.  Musculoskeletal: Positive for joint pain (left hip and left ankle pain) and myalgias.  Skin: Negative for rash.  Neurological: Positive for weakness. Negative for dizziness and headaches.  Psychiatric/Behavioral: Negative for memory loss. The patient is not nervous/anxious.             Past Medical History:  Diagnosis Date   Allergic rhinitis     BPH (benign prostatic hyperplasia)     Chronic atrial fibrillation     Diabetes mellitus (HCC)     DVT (deep venous thrombosis) (HCC)      LLE   Hematochezia     HTN (hypertension)     Kidney transplant recipient     PVD (peripheral vascular disease) ([facility])            Past Surgical History:  Procedure Laterality Date   KIDNEY TRANSPLANT          Family History  Problem Relation Age of Onset   Stroke Mother     Heart disease Father     Diabetes Father     Diabetes Sister     Diabetes Brother     Heart disease Brother        Social History: Married. 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 He used to smoke 1/2 PPD X 15 years --quit 11/86. He uses alcohol on rare occasions.  Allergies  Allergen Reactions   Iodinated Diagnostic Agents Hives   Tape Hives           Medications Prior to Admission  Medication Sig Dispense Refill   atorvastatin (LIPITOR) 10 MG tablet Take 10 mg by mouth daily.       cinacalcet (SENSIPAR) 60 MG tablet Take 60 mg by mouth daily.       diclofenac sodium (VOLTAREN) 1 % GEL Apply 2 g topically 2 (two) times a day. Left hip       dorzolamide-timolol (COSOPT) 22.3-6.8 MG/ML ophthalmic solution Place 1 drop into the right eye 2 (two) times a day.       erythromycin ophthalmic ointment Place 1 application into both eyes at bedtime.       famotidine (PEPCID) 20 MG tablet Take 20 mg by mouth 2 (two) times daily.       finasteride (PROSCAR) 5 MG tablet  Take 5 mg by mouth daily.       labetalol (NORMODYNE) 100 MG tablet Take 100 mg by mouth 2 (two) times daily.       magnesium oxide (MAG-OX) 400 MG tablet Take 400 mg by mouth daily.       metFORMIN (GLUCOPHAGE) 500 MG tablet Take 500 mg by mouth 2 (two) times daily with a meal.        mycophenolate (MYFORTIC) 180 MG EC tablet Take 360 mg by mouth 2 (two) times daily.        nateglinide (STARLIX) 120 MG tablet Take 120 mg by mouth See admin instructions. Take 120mg  before lunch and dinner, skip dose if blood sugar is under 130.       NIFEdipine (PROCARDIA XL/NIFEDICAL XL) 60 MG 24 hr tablet Take 60 mg by mouth 2 (two) times a day.        pioglitazone (ACTOS) 30 MG tablet Take 15 mg by mouth daily.        predniSONE (DELTASONE) 10 MG tablet Take 10 mg by mouth daily with breakfast.       sitaGLIPtin (JANUVIA) 100 MG tablet Take 100 mg by mouth daily.       sulfamethoxazole-trimethoprim (BACTRIM) 400-80 MG tablet Take 1 tablet by mouth every Monday, Wednesday, and Friday.        tacrolimus (PROGRAF) 1 MG capsule Take 2 mg by mouth 2 (two) times daily.        tamsulosin (FLOMAX) 0.4 MG CAPS capsule Take 0.4 mg by mouth daily after breakfast.        warfarin (COUMADIN) 5 MG tablet Take 2.5-5 mg by mouth daily. Take 2.5mg  on MON TUES THUR FRI SAT, then take 5mg  on WED and SUN.         Home: Home Living Family/patient expects to be discharged to:: Private residence Living Arrangements: Spouse/significant other Available Help at Discharge: Family, Available 24 hours/day Type of Home: House Home Access: Stairs to enter [employer] of Steps: 3 Entrance Stairs-Rails: None Home Layout: One level Bathroom Shower/Tub: Chiropodist: Standard Home Equipment: Environmental consultant - 2 wheels  Functional History: Prior Function Level of Independence: Independent Comments: Mowed yard on day of admit Functional Status:  Mobility: Bed Mobility Overal bed mobility: Needs  Assistance Bed Mobility: Supine to Sit, Sit to Supine Supine to sit: Min guard, HOB elevated General bed mobility comments: Incr time. Assist for safety and lines Transfers Overall transfer level: Needs assistance Equipment used: Rolling walker (2 wheeled) Transfers: Sit to/from Stand, [employer] Transfers Sit to Stand: Max  assist, +2 physical assistance, +2 safety/equipment, Mod assist Stand pivot transfers: Max assist, +2 physical assistance, +2 safety/equipment General transfer comment: Assist to bring hips up and for balance. Legs unstable. Short shuffling steps. Verbal cues for hand placement Ambulation/Gait Ambulation/Gait assistance: Mod assist, +2 safety/equipment Gait Distance (Feet): 75 Feet Assistive device: Rolling walker (2 wheeled) Gait Pattern/deviations: Step-to pattern, Decreased step length - right, Decreased step length - left, Shuffle, Trunk flexed, Drifts right/left General Gait Details: Assist with balance and support. Close chair follow. Legs unstable. Pt did better the longer he walked.  Needed constant cues for upright posture.  Drifts left needing assist and cues to steer to right.  Gait velocity: decr Gait velocity interpretation: <1.31 ft/sec, indicative of household ambulator     ADL: ADL Overall ADL's : Needs assistance/impaired Grooming: Wash/dry hands, Wash/dry face, Set up, Sitting Upper Body Bathing: Minimal assistance, Sitting Lower Body Bathing: Maximal assistance, Sitting/lateral leans, Sit to/from stand, Cueing for sequencing, Cueing for safety Upper Body Dressing : Minimal assistance, Sitting Lower Body Dressing: Maximal assistance, Cueing for safety, Cueing for sequencing, Sitting/lateral leans, Sit to/from stand Toilet Transfer: Maximal assistance, +2 for physical assistance, +2 for safety/equipment, BSC Toileting- Clothing Manipulation and Hygiene: Maximal assistance, +2 for physical assistance, +2 for safety/equipment, Sitting/lateral lean,  Sit to/from stand Functional mobility during ADLs: (deferred) General ADL Comments: Pt limited by weakness, poor attention, decreased cognitive abilities and decreased strength requiring assist and sequencing assist for ADL   Cognition: Cognition Overall Cognitive Status: Impaired/Different from baseline Orientation Level: Oriented X4 Cognition Arousal/Alertness: Awake/alert Overall Cognitive Status: Impaired/Different from baseline Area of Impairment: Orientation, Memory, Attention, Following commands, Safety/judgement, Awareness, Problem solving Orientation Level: Disoriented to, Place, Situation Current Attention Level: Sustained Memory: Decreased short-term memory Following Commands: Follows one step commands with increased time Safety/Judgement: Decreased awareness of safety, Decreased awareness of deficits Awareness: Intellectual Problem Solving: Slow processing, Decreased initiation, Requires verbal cues, Requires tactile cues     Blood pressure 128/69, pulse 88, temperature 98.7 F (37.1 C), resp. rate 16, height 5\' 11"  (1.803 m), weight 91.1 kg, SpO2 94 %. Physical Exam  Nursing note and vitals reviewed. Constitutional: He is oriented to person, place, and time. He appears well-developed and well-nourished. No distress.  HENT:  Head: Normocephalic.  Eyes: Pupils are equal, round, and reactive to light. EOM are normal.  Neck: Normal range of motion. No thyromegaly present.  Cardiovascular: Normal rate and regular rhythm.  Respiratory: Effort normal. No respiratory distress.  GI: Soft. He exhibits no distension.  Musculoskeletal:        General: Edema (1 to 2+ LE>UE ) present.     Comments: 1+ pitting edema bilateral forearms/hands. Abrasions left elbow and left knee.   Neurological: He is alert and oriented to person, place, and time. No cranial nerve deficit. Coordination normal.  Speech clear. Able to follow commands without difficulty. Normal insight and awareness. UE  4/5. LE: 3/5 HF, KE and 4/5 ADF/PF. No focal sensory deficits  Skin: Skin is warm and dry. He is not diaphoretic.  Psychiatric: He has a normal mood and affect. His behavior is normal.      Lab Results Last 24 Hours  Results for orders placed or performed during the hospital encounter of 02/27/19 (from the past 24 hour(s))  Glucose, capillary     Status: Abnormal    Collection Time: 03/04/19 11:48 AM  Result Value Ref Range    Glucose-Capillary 154 (H) 70 - 99 mg/dL  Glucose, capillary  Status: Abnormal    Collection Time: 03/04/19  3:30 PM  Result Value Ref Range    Glucose-Capillary 185 (H) 70 - 99 mg/dL  Glucose, capillary     Status: Abnormal    Collection Time: 03/04/19  7:42 PM  Result Value Ref Range    Glucose-Capillary 252 (H) 70 - 99 mg/dL  Glucose, capillary     Status: Abnormal    Collection Time: 03/04/19 11:16 PM  Result Value Ref Range    Glucose-Capillary 140 (H) 70 - 99 mg/dL    Comment 1 Document in Chart    Protime-INR     Status: Abnormal    Collection Time: 03/05/19  3:53 AM  Result Value Ref Range    Prothrombin Time 22.6 (H) 11.4 - 15.2 seconds    INR 2.0 (H) 0.8 - 1.2  Renal function panel     Status: Abnormal    Collection Time: 03/05/19  3:53 AM  Result Value Ref Range    Sodium 140 135 - 145 mmol/L    Potassium 3.0 (L) 3.5 - 5.1 mmol/L    Chloride 107 98 - 111 mmol/L    CO2 22 22 - 32 mmol/L    Glucose, Bld 171 (H) 70 - 99 mg/dL    BUN 38 (H) 8 - 23 mg/dL    Creatinine, Ser 1.29 (H) 0.61 - 1.24 mg/dL    Calcium 8.3 (L) 8.9 - 10.3 mg/dL    Phosphorus 2.2 (L) 2.5 - 4.6 mg/dL    Albumin 2.3 (L) 3.5 - 5.0 g/dL    GFR calc non Af Amer 57 (L) >60 mL/min    GFR calc Af Amer >60 >60 mL/min    Anion gap 11 5 - 15  CBC with Differential/Platelet     Status: Abnormal    Collection Time: 03/05/19  3:53 AM  Result Value Ref Range    WBC 10.2 4.0 - 10.5 K/uL    RBC 3.84 (L) 4.22 - 5.81 MIL/uL    Hemoglobin 10.6 (L) 13.0 - 17.0 g/dL    HCT 32.7 (L)  39.0 - 52.0 %    MCV 85.2 80.0 - 100.0 fL    MCH 27.6 26.0 - 34.0 pg    MCHC 32.4 30.0 - 36.0 g/dL    RDW 15.9 (H) 11.5 - 15.5 %    Platelets 85 (L) 150 - 400 K/uL    nRBC 0.2 0.0 - 0.2 %    Neutrophils Relative % 81 %    Neutro Abs 8.3 (H) 1.7 - 7.7 K/uL    Lymphocytes Relative 3 %    Lymphs Abs 0.3 (L) 0.7 - 4.0 K/uL    Monocytes Relative 9 %    Monocytes Absolute 0.9 0.1 - 1.0 K/uL    Eosinophils Relative 0 %    Eosinophils Absolute 0.0 0.0 - 0.5 K/uL    Basophils Relative 0 %    Basophils Absolute 0.0 0.0 - 0.1 K/uL    Immature Granulocytes 7 %    Abs Immature Granulocytes 0.69 (H) 0.00 - 0.07 K/uL    Polychromasia PRESENT    Lipid panel     Status: Abnormal    Collection Time: 03/05/19  3:53 AM  Result Value Ref Range    Cholesterol 108 0 - 200 mg/dL    Triglycerides 75 <150 mg/dL    HDL 28 (L) >40 mg/dL    Total CHOL/HDL Ratio 3.9 RATIO    VLDL 15 0 - 40 mg/dL    LDL Cholesterol  65 0 - 99 mg/dL  Magnesium     Status: None    Collection Time: 03/05/19  3:53 AM  Result Value Ref Range    Magnesium 1.8 1.7 - 2.4 mg/dL  Glucose, capillary     Status: Abnormal    Collection Time: 03/05/19  5:27 AM  Result Value Ref Range    Glucose-Capillary 150 (H) 70 - 99 mg/dL  Glucose, capillary     Status: Abnormal    Collection Time: 03/05/19  7:43 AM  Result Value Ref Range    Glucose-Capillary 142 (H) 70 - 99 mg/dL     Imaging Results (Last 48 hours)  No results found.       Assessment/Plan: Diagnosis: 68 yo male with debility after pneumonia, sepsis. Previously active 1. Does the need for close, 24 hr/day medical supervision in concert with the patient's rehab needs make it unreasonable for this patient to be served in a less intensive setting? Yes 2. Co-Morbidities requiring supervision/potential complications: HTN, DM, ESRD, CAF 3. Due to bladder management, bowel management, safety, skin/wound care, disease management, medication administration, pain management and  patient education, does the patient require 24 hr/day rehab nursing? Yes 4. Does the patient require coordinated care of a physician, rehab nurse, PT (1-2 hrs/day, 5 days/week) and OT (1-2 hrs/day, 5 days/week) to address physical and functional deficits in the context of the above medical diagnosis(es)? Yes Addressing deficits in the following areas: balance, endurance, locomotion, strength, transferring, bowel/bladder control, bathing, dressing, feeding, grooming, toileting and psychosocial support 5. Can the patient actively participate in an intensive therapy program of at least 3 hrs of therapy per day at least 5 days per week? Yes 6. The potential for patient to make measurable gains while on inpatient rehab is excellent 7. Anticipated functional outcomes upon discharge from inpatient rehab are modified independent and supervision  with PT, modified independent and supervision with OT, n/a with SLP. 8. Estimated rehab length of stay to reach the above functional goals is: 11-16 days 9. Anticipated D/C setting: Home 10. Anticipated post D/C treatments: [place] therapy 11. Overall Rehab/Functional Prognosis: excellent   RECOMMENDATIONS: This patient's condition is appropriate for continued rehabilitative care in the following setting: CIR Patient has agreed to participate in recommended program. Yes Note that insurance prior authorization may be required for reimbursement for recommended care.   Comment: Rehab Admissions Coordinator to follow up.   Thanks,   Meredith Staggers, MD, [facility]   I have personally performed a face to face diagnostic evaluation of this patient. Additionally, I have examined pertinent labs and radiographic images. I have reviewed and concur with the physician assistant's documentation above.     Bary Leriche, PA-C 03/05/2019        Revision History      Routing History

## 2019-03-08 NOTE — Progress Notes (Signed)
Jhonnie Garner, OT  Rehab Admission Coordinator  Physical Medicine and Rehabilitation  PMR Pre-admission  Signed  Date of Service:  03/07/2019 8:09 AM      Related encounter: ED to Hosp-Admission (Current) from 02/27/2019 in [facility]      Signed         PMR Admission Coordinator Pre-Admission Assessment  Patient: Ryan Rice is an 68 y.o., male MRN: [id number] DOB: [birth year] Height: 5' 11"  (180.3 cm) Weight: 95.6 kg                                                                                                                                                  Insurance Information HMO: yes    PPO:      PCP:      IPA:      80/20:      OTHER:  PRIMARY: [employer]      Policy#: [id number]      Subscriber: Patient CM Name: Dawson Bills      Phone#: [phone]     Fax#: [phone] Pre-Cert#: [id number]     Employer:  Josem Kaufmann approval provided by Dawson Bills for admit to CIR on 6/12. Follow up CM is Lestine Box (p): [phone] [id number] (f): [phone] Updated clinicals are for DC planning only.  Benefits:  Phone #: online     Name: [email or web address] Eff. Date: 09/26/18 still active     Deduct: $0      Out of Pocket Max: $3,400 (met [phone])      Life Max: NA CIR: $295/day for days 1-6, $0/day for days 7+      SNF: $0/day for days 1-20, $178/day for days 21-100; limited to 100 days/cal yr Outpatient: limited by medical necessity      Co-Pay: $10-$40/visist pending service Home Health: 100%, limited by medical necessity      Co-Pay: 0% DME: 80%     Co-Pay: 20% Providers:  SECONDARY: None       Policy#:       Subscriber:  CM Name:       Phone#:      Fax#:  Pre-Cert#:       Employer:  Benefits:  Phone #:      Name:  Eff. Date:      Deduct:       Out of Pocket Max:       Life Max:  CIR:       SNF:  Outpatient:      Co-Pay:  Home Health:       Co-Pay:  DME:      Co-Pay:   Medicaid Application Date:       Case Manager:  Disability Application Date:        Case Worker:   The "Data Collection Information Summary" for patients in Inpatient Rehabilitation Facilities with attached "Privacy  Act [place] Records" was provided and verbally reviewed with: Patient  Emergency Contact Information         Contact Information    Name Relation Home Work Mobile   [place] Spouse   [phone]     Current Medical History  Patient Admitting Diagnosis: 68 yo male with debility after pneumonia, sepsis. Previously active  History of Present Illness: Ryan Rice a 68 y.o.malewith history of HTN, DM, ESRD s/p renal transplant, and CAF. Pt was admitted on 02/27/19 with diarrhea and abdominal pain. He was found to be hypoxic with increased WOB due to extensive L>R PNA with sepsis. He was COVID negative. He was placed on BIPAP and started on broad spectrum. He developed hypotension with worsening of respiratory status requiring pressors, stress dose steriodsand intubation on 6/04. Nephrology consulted due to acute on chronic renal failure and patient started on bicarb for metabolic acidosis with close monitoring of urine output. He was extubated 6/6 and tolerated a regular diet. Renal status improved with increase in UOP. Blood cultures positive for Acinetobacter and antibiotics narrowed to Unasyn. Hospital course significant for RUE edema, elevated troponin due to demand ischemia as well as incidental radiologic findings of mass on native kidney and mass on prostate. His mentation has improved but he continues to have hypoxia with activity, disorientation with difficulty processing as well as unsteady gait. Left ankle Xrays done due to pain and swelling and revealedmedial OA with widespread arterial vascular calcifications.CIR recommended due to debility. Pt is to be admitted to CIR on 03/08/19.    Glasgow Coma Scale Score: 15  Past Medical History      Past Medical History:  Diagnosis Date  . Allergic rhinitis   . BPH  (benign prostatic hyperplasia)   . Chronic atrial fibrillation   . Diabetes mellitus ([place])   . DVT (deep venous thrombosis) ([facility])    LLE  . Hematochezia   . HTN (hypertension)   . Kidney transplant recipient   . PVD (peripheral vascular disease) ([place])     Family History  family history includes Diabetes in his brother, father, and sister; Heart disease in his brother and father; Stroke in his mother.  Prior Rehab/Hospitalizations:  Has the patient had prior rehab or hospitalizations prior to admission? No  Has the patient had major surgery during 100 days prior to admission? No  Current Medications   Current Facility-Administered Medications:  .  0.9 %  sodium chloride infusion, , Intravenous, PRN, Collene Gobble, MD, Stopped at 03/03/19 1054 .  amLODipine (NORVASC) tablet 5 mg, 5 mg, Oral, Daily, Elodia Florence., MD, 5 mg at 03/08/19 0853 .  Ampicillin-Sulbactam (UNASYN) 3 g in sodium chloride 0.9 % 100 mL IVPB, 3 g, Intravenous, Q6H, Kyle, Tyrone A, DO, Last Rate: 200 mL/hr at 03/08/19 0901, 3 g at 03/08/19 0901 .  atorvastatin (LIPITOR) tablet 10 mg, 10 mg, Oral, q1800, Desai, Rahul P, PA-C, 10 mg at 03/07/19 1717 .  benzonatate (TESSALON) capsule 100 mg, 100 mg, Oral, TID PRN, Marylyn Ishihara, Tyrone A, DO, 100 mg at 03/08/19 1001 .  chlorhexidine (PERIDEX) 0.12 % solution 15 mL, 15 mL, Mouth Rinse, BID, Ramaswamy, Murali, MD, 15 mL at 03/08/19 0854 .  Chlorhexidine Gluconate Cloth 2 % PADS 6 each, 6 each, Topical, Daily, Collene Gobble, MD, 6 each at 03/07/19 1000 .  famotidine (PEPCID) tablet 20 mg, 20 mg, Oral, Daily, Brand Males, MD, 20 mg at 03/08/19 0851 .  feeding supplement (ENSURE ENLIVE) (ENSURE ENLIVE) liquid  237 mL, 237 mL, Oral, BID BM, Elodia Florence., MD, 237 mL at 52/84/13 2440 .  folic acid (FOLVITE) tablet 1 mg, 1 mg, Oral, Daily, Brand Males, MD, 1 mg at 03/08/19 0851 .  hydrALAZINE (APRESOLINE) injection 10 mg, 10 mg, Intravenous,  Q4H PRN, Anders Simmonds, MD, 10 mg at 03/04/19 0127 .  insulin aspart (novoLOG) injection 0-20 Units, 0-20 Units, Subcutaneous, Q4H, Desai, Rahul P, PA-C, 7 Units at 03/08/19 1246 .  labetalol (NORMODYNE) tablet 100 mg, 100 mg, Oral, BID, Anders Simmonds, MD, 100 mg at 03/08/19 0851 .  magnesium oxide (MAG-OX) tablet 400 mg, 400 mg, Oral, BID, Kyle, Tyrone A, DO, 400 mg at 03/08/19 0853 .  MEDLINE mouth rinse, 15 mL, Mouth Rinse, q12n4p, Ramaswamy, Murali, MD, 15 mL at 03/08/19 1247 .  mycophenolate (MYFORTIC) EC tablet 360 mg, 360 mg, Oral, BID, Dwana Melena, MD, 360 mg at 03/08/19 0854 .  potassium & sodium phosphates (PHOS-NAK) 280-160-250 MG packet 1 packet, 1 packet, Oral, TID WC & HS, Kyle, Tyrone A, DO, 1 packet at 03/08/19 0855 .  predniSONE (DELTASONE) tablet 40 mg, 40 mg, Oral, Q breakfast, Elodia Florence., MD, 40 mg at 03/08/19 0853 .  sodium chloride flush (NS) 0.9 % injection 10-40 mL, 10-40 mL, Intracatheter, Q12H, Collene Gobble, MD, 10 mL at 03/07/19 0913 .  sodium chloride flush (NS) 0.9 % injection 10-40 mL, 10-40 mL, Intracatheter, PRN, Collene Gobble, MD, 20 mL at 03/01/19 2247 .  sulfamethoxazole-trimethoprim (BACTRIM) 400-80 MG per tablet 1 tablet, 1 tablet, Oral, Q M,W,F, Desai, Rahul P, PA-C, 1 tablet at 03/08/19 0854 .  tacrolimus (PROGRAF) capsule 2 mg, 2 mg, Oral, BID, Desai, Rahul P, PA-C, 2 mg at 03/08/19 0853 .  tamsulosin (FLOMAX) capsule 0.4 mg, 0.4 mg, Oral, Daily, Anders Simmonds, MD, 0.4 mg at 03/08/19 0853 .  thiamine (VITAMIN B-1) tablet 250 mg, 250 mg, Oral, Daily, Ramaswamy, Murali, MD, 250 mg at 03/08/19 0852 .  warfarin (COUMADIN) tablet 2 mg, 2 mg, Oral, ONCE-1800, Skeet Simmer, RPH .  Warfarin - Pharmacist Dosing Inpatient, , Does not apply, q1800, Rush Barer, RPH  Patients Current Diet:     Diet Order                  Diet heart healthy/carb modified Room service appropriate? Yes; Fluid consistency: Thin  Diet effective  now               Precautions / Restrictions Precautions Precautions: Fall Restrictions Weight Bearing Restrictions: No   Has the patient had 2 or more falls or a fall with injury in the past year?Yes  Prior Activity Level Community (5-7x/wk): active and independent PTA; used to own lawn service company   Prior Functional Level Prior Function Level of Independence: Independent Comments: Mowed yard on day of admit  Self Care: Did the patient need help bathing, dressing, using the toilet or eating?  Independent  Indoor Mobility: Did the patient need assistance with walking from room to room (with or without device)? Independent  Stairs: Did the patient need assistance with internal or external stairs (with or without device)? Independent  Functional Cognition: Did the patient need help planning regular tasks such as shopping or remembering to take medications? Independent  Home Assistive Devices / Equipment Home Equipment: Walker - 2 wheels  Prior Device Use: Indicate devices/aids used by the patient prior to current illness, exacerbation or injury? None of the above  Current Functional Level Cognition  Overall Cognitive Status: No family/caregiver present to determine baseline cognitive functioning Current Attention Level: Sustained Orientation Level: Oriented X4 Following Commands: Follows one step commands with increased time Safety/Judgement: Decreased awareness of safety, Decreased awareness of deficits General Comments: pt very appreciative to be out of bed and sitting in recliner     Extremity Assessment (includes Sensation/Coordination)  Upper Extremity Assessment: Generalized weakness RUE Deficits / Details: weakness 3-/5 MM grade LUE Deficits / Details: Decreased strenth 2+/5 MM grade, edematous LUE Coordination: decreased fine motor, decreased gross motor  Lower Extremity Assessment: Defer to PT evaluation    ADLs  Overall ADL's : Needs  assistance/impaired Grooming: Wash/dry hands, Wash/dry face, Set up, Sitting Upper Body Bathing: Minimal assistance, Sitting Lower Body Bathing: Maximal assistance, Sitting/lateral leans, Sit to/from stand, Cueing for sequencing, Cueing for safety Upper Body Dressing : Minimal assistance, Sitting Lower Body Dressing: Maximal assistance Lower Body Dressing Details (indicate cue type and reason): pt required assistance to don socks;pt able to reach to mid-foot when long-sitting in bed Toilet Transfer: Requires drop arm, Min guard Toilet Transfer Details (indicate cue type and reason): simulated transfer from EOB to recliner with drop arm, pt required minguard for safety with lateral scoot to recliner Toileting- Clothing Manipulation and Hygiene: Maximal assistance, +2 for physical assistance, +2 for safety/equipment, Sitting/lateral lean, Sit to/from stand Functional mobility during ADLs: Min guard, Minimal assistance General ADL Comments: Pt limited by weakness, poor attention, decreased cognitive abilities and decreased strength requiring assist and sequencing assist for ADL    Mobility  Overal bed mobility: Needs Assistance Bed Mobility: Supine to Sit Supine to sit: Supervision, HOB elevated General bed mobility comments: increased time;supervision for safety    Transfers  Overall transfer level: Needs assistance Equipment used: Rolling walker (2 wheeled) Transfers: Lateral/Scoot Transfers Sit to Stand: Max assist, +2 physical assistance, +2 safety/equipment, Mod assist Stand pivot transfers: Max assist, +2 physical assistance, +2 safety/equipment  Lateral/Scoot Transfers: Min guard General transfer comment: due to pain in bilateral feet, unable to come into standing this session;minguard for safety;pt scooted from EOB to recliner, DoE 2/4    Ambulation / Gait / Stairs / Wheelchair Mobility  Ambulation/Gait Ambulation/Gait assistance: Mod assist, +2 safety/equipment Gait  Distance (Feet): 75 Feet Assistive device: Rolling walker (2 wheeled) Gait Pattern/deviations: Step-to pattern, Decreased step length - right, Decreased step length - left, Shuffle, Trunk flexed, Drifts right/left General Gait Details: Assist with balance and support. Close chair follow. Legs unstable. Pt did better the longer he walked.  Needed constant cues for upright posture.  Drifts left needing assist and cues to steer to right.  Gait velocity: decr Gait velocity interpretation: <1.31 ft/sec, indicative of household ambulator    Posture / Balance Dynamic Sitting Balance Sitting balance - Comments: able to lean over to reach toes while sitting EOB, no LOB noted Balance Overall balance assessment: Needs assistance Sitting-balance support: No upper extremity supported, Feet supported Sitting balance-Leahy Scale: Fair Sitting balance - Comments: able to lean over to reach toes while sitting EOB, no LOB noted Standing balance support: Bilateral upper extremity supported Standing balance-Leahy Scale: Poor Standing balance comment: RW and external support.  High level balance activites: Side stepping High Level Balance Comments: very difficult for pt to comprehend moving BLEs to right     Special needs/care consideration BiPAP/CPAP: no CPM: no Continuous Drip IV: no Dialysis: no  (but has had dialysis before prior to renal transplant)      Days: no Life  Vest: no Oxygen: no Special Bed: no Trach Size: no Wound Vac (area): no     Location: no Skin: right arm skin tear                           Bowel mgmt: last BM: 03/06/19; [facility] Bladder mgmt: continent Diabetic mgmt: yes Behavioral consideration : no Chemo/radiation : no     Previous Home Environment (from acute therapy documentation) Living Arrangements: Spouse/significant other Available Help at Discharge: Family, Available 24 hours/day Type of Home: House Home Layout: One level Home Access: Stairs to enter  Entrance Stairs-Rails: None Technical brewer of Steps: 3 Bathroom Shower/Tub: Chiropodist: Standard  Discharge Living Setting Plans for Discharge Living Setting: Patient's home, House, Lives with (comment)(wife ) Type of Home at Discharge: House Discharge Home Layout: One level Discharge Home Access: Stairs to enter Entrance Stairs-Rails: None Entrance Stairs-Number of Steps: 5 Discharge Bathroom Shower/Tub: Walk-in shower Discharge Bathroom Toilet: Standard Discharge Bathroom Accessibility: Yes How Accessible: Accessible via walker Does the patient have any problems obtaining your medications?: No  Social/Family/Support Systems Patient Roles: Spouse Contact Information: wife: [name] [phone]) Anticipated Caregiver: wife Anticipated Caregiver's Contact Information: see above Ability/Limitations of Caregiver: Min A Caregiver Availability: 24/7 Discharge Plan Discussed with Primary Caregiver: Yes Is Caregiver In Agreement with Plan?: Yes Does Caregiver/Family have Issues with Lodging/Transportation while Pt is in Rehab?: No   Goals/Additional Needs Patient/Family Goal for Rehab: PT/OT: Mod I/Supervision; SLP: NA Expected length of stay: 11-16 days Cultural Considerations: [employer]  Dietary Needs: heart healthy/carb modified; thin liquids Equipment Needs: TBD Pt/Family Agrees to Admission and willing to participate: Yes Program Orientation Provided & Reviewed with Pt/Caregiver Including Roles  & Responsibilities: Yes(pt and his wife)  Barriers to Discharge: Home environment access/layout  Barriers to Discharge Comments: steps to enter   Decrease burden of Care through IP rehab admission: NA   Possible need for SNF placement upon discharge: not anticipated; pt has good family support who can assist him at DC as needed. Pt has an excellent prognosis for further progress through CIR.    Patient Condition: This patient's medical and  functional status has changed since the consult dated: 03/05/19 in which the Rehabilitation Physician determined and documented that the patient's condition is appropriate for intensive rehabilitative care in an inpatient rehabilitation facility. See "History of Present Illness" (above) for medical update. Functional changes are: decline in transfer ability from Max A x2 for sit to stand to need to transition to lateral scoot transfers at [facility] G due to ankle pain from potential gout. Pt has not been able to ambulate due to acute ankle pain from suspected gout flare.  Patient's medical and functional status update has been discussed with the Rehabilitation physician and patient remains appropriate for inpatient rehabilitation. Will admit to inpatient rehab today, 03/08/19.  Preadmission Screen Completed By:  Jhonnie Garner, OT, 03/08/2019 1:43 PM ______________________________________________________________________   Discussed status with Dr. Naaman Plummer on 03/08/19 at 11:51AM and received approval for admission today.  Admission Coordinator:  Jhonnie Garner, time 1:42PM/Date 03/08/2019          Cosigned by: Meredith Staggers, MD at 03/08/2019 1:49 PM  Revision History Date/Time User Provider Type Action  03/08/2019 1:49 PM Meredith Staggers, MD Physician Cosign  03/08/2019 1:43 PM Jhonnie Garner, [place] Admission Coordinator Sign

## 2019-03-08 NOTE — H&P (Signed)
Physical Medicine and Rehabilitation Admission H&P    Chief Complaint  Patient presents with   Debility    HPI:  Ryan Rice is a 68 year old male with history of ESRD s/p renal transplant, CAF, T2DM, who was admitted on 02/27/2019 with diarrhea and abdominal pain.  He was noted to be hypoxic with increased WOB due to extensive left greater than right PNA with sepsis.  He was placed on BiPAP and started on broad-spectrum antibiotics however developed hypotension with worsening of respiratory status.  He was treated with pressors, stress dose steroids and was intubated on 6/04.  Nephrology consulted for input on acute on chronic renal failure with metabolic acidosis and he was treated with bicarb.  He tolerated extubation by 06/06 and urine output/renal status is improving.  Elevated troponins felt to be due to demand ischemia patient cardiology follow-up.  Abdominal ultrasound showed increased attenuation around the left prostate as well as mass arising from --he will need urology follow-up after discharge. His blood cultures were positive for Acinetobacter Calcoacetius and coag negative staph and antibiotics narrowed to Unasyn recommendation to complete 10 days total of antibiotic course.  Myfortic resumed 06/09.  He developed pain bilateral feet felt to be due to gout flare and prednisone increased to 40 mg for treatment on 6/10.  Hypokalemia hypomagnesemia have resolved with supplementation.  Therapy has been ongoing revealing debility.  CIR recommended due to functional deficits.   Review of Systems  Constitutional: Negative for fever.  HENT: Negative for hearing loss.   Eyes: Negative for blurred vision.  Respiratory: Positive for cough and shortness of breath.   Cardiovascular: Negative for palpitations and orthopnea.  Gastrointestinal: Negative for constipation, nausea and vomiting.  Genitourinary: Negative for dysuria and urgency.  Musculoskeletal: Positive for back pain and joint  pain.  Skin: Negative for rash.  Neurological: Positive for weakness. Negative for dizziness and headaches.  Psychiatric/Behavioral: Positive for depression. The patient is not nervous/anxious.      Past Medical History:  Diagnosis Date   Allergic rhinitis    BPH (benign prostatic hyperplasia)    Chronic atrial fibrillation    Diabetes mellitus (HCC)    DVT (deep venous thrombosis) (HCC)    LLE   Hematochezia    HTN (hypertension)    Kidney transplant recipient    PVD (peripheral vascular disease) ([place])     Past Surgical History:  Procedure Laterality Date   KIDNEY TRANSPLANT      Family History  Problem Relation Age of Onset   Stroke Mother    Heart disease Father    Diabetes Father    Diabetes Sister    Diabetes Brother    Heart disease Brother     Social History: Married.  Retired.  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  He used to smoke half pack per day for 10 years--quit 11/86.  He uses alcohol on rare occasions.   Allergies  Allergen Reactions   Iodinated Diagnostic Agents Hives   Tape Hives    Medications Prior to Admission  Medication Sig Dispense Refill   atorvastatin (LIPITOR) 10 MG tablet Take 10 mg by mouth daily.     cinacalcet (SENSIPAR) 60 MG tablet Take 60 mg by mouth daily.     diclofenac sodium (VOLTAREN) 1 % GEL Apply 2 g topically 2 (two) times a day. Left hip     dorzolamide-timolol (COSOPT) 22.3-6.8 MG/ML ophthalmic solution Place 1 drop  into the right eye 2 (two) times a day.     erythromycin ophthalmic ointment Place 1 application into both eyes at bedtime.     famotidine (PEPCID) 20 MG tablet Take 20 mg by mouth 2 (two) times daily.     finasteride (PROSCAR) 5 MG tablet Take 5 mg by mouth daily.     labetalol (NORMODYNE) 100 MG tablet Take 100 mg by mouth 2 (two) times daily.     magnesium oxide (MAG-OX) 400 MG tablet Take 400 mg by mouth daily.     metFORMIN  (GLUCOPHAGE) 500 MG tablet Take 500 mg by mouth 2 (two) times daily with a meal.      mycophenolate (MYFORTIC) 180 MG EC tablet Take 360 mg by mouth 2 (two) times daily.      nateglinide (STARLIX) 120 MG tablet Take 120 mg by mouth See admin instructions. Take 120mg  before lunch and dinner, skip dose if blood sugar is under 130.     NIFEdipine (PROCARDIA XL/NIFEDICAL XL) 60 MG 24 hr tablet Take 60 mg by mouth 2 (two) times a day.      pioglitazone (ACTOS) 30 MG tablet Take 15 mg by mouth daily.      predniSONE (DELTASONE) 10 MG tablet Take 10 mg by mouth daily with breakfast.     sitaGLIPtin (JANUVIA) 100 MG tablet Take 100 mg by mouth daily.     sulfamethoxazole-trimethoprim (BACTRIM) 400-80 MG tablet Take 1 tablet by mouth every Monday, Wednesday, and Friday.      tacrolimus (PROGRAF) 1 MG capsule Take 2 mg by mouth 2 (two) times daily.      tamsulosin (FLOMAX) 0.4 MG CAPS capsule Take 0.4 mg by mouth daily after breakfast.      warfarin (COUMADIN) 5 MG tablet Take 2.5-5 mg by mouth daily. Take 2.5mg  on MON TUES THUR FRI SAT, then take 5mg  on WED and SUN.      Drug Regimen Review  Drug regimen was reviewed and remains appropriate with no significant issues identified  Home: Home Living Family/patient expects to be discharged to:: Private residence Living Arrangements: Spouse/significant other Available Help at Discharge: Family, Available 24 hours/day Type of Home: House Home Access: Stairs to enter [employer] of Steps: 3 Entrance Stairs-Rails: None Home Layout: One level Bathroom Shower/Tub: Chiropodist: Standard Home Equipment: Environmental consultant - 2 wheels   Functional History: Prior Function Level of Independence: Independent Comments: Mowed yard on day of admit  Functional Status:  Mobility: Bed Mobility Overal bed mobility: Needs Assistance Bed Mobility: Supine to Sit Supine to sit: Supervision, HOB elevated General bed mobility comments:  increased time;supervision for safety Transfers Overall transfer level: Needs assistance Equipment used: Rolling walker (2 wheeled) Transfers: Lateral/Scoot Transfers Sit to Stand: Max assist, +2 physical assistance, +2 safety/equipment, Mod assist Stand pivot transfers: Max assist, +2 physical assistance, +2 safety/equipment  Lateral/Scoot Transfers: Min guard General transfer comment: due to pain in bilateral feet, unable to come into standing this session;minguard for safety;pt scooted from EOB to recliner, DoE 2/4 Ambulation/Gait Ambulation/Gait assistance: Mod assist, +2 safety/equipment Gait Distance (Feet): 75 Feet Assistive device: Rolling walker (2 wheeled) Gait Pattern/deviations: Step-to pattern, Decreased step length - right, Decreased step length - left, Shuffle, Trunk flexed, Drifts right/left General Gait Details: Assist with balance and support. Close chair follow. Legs unstable. Pt did better the longer he walked.  Needed constant cues for upright posture.  Drifts left needing assist and cues to steer to right.  Gait velocity: decr Gait  velocity interpretation: <1.31 ft/sec, indicative of household ambulator    ADL: ADL Overall ADL's : Needs assistance/impaired Grooming: Wash/dry hands, Wash/dry face, Set up, Sitting Upper Body Bathing: Minimal assistance, Sitting Lower Body Bathing: Maximal assistance, Sitting/lateral leans, Sit to/from stand, Cueing for sequencing, Cueing for safety Upper Body Dressing : Minimal assistance, Sitting Lower Body Dressing: Maximal assistance Lower Body Dressing Details (indicate cue type and reason): pt required assistance to don socks;pt able to reach to mid-foot when long-sitting in bed Toilet Transfer: Requires drop arm, Min guard Toilet Transfer Details (indicate cue type and reason): simulated transfer from EOB to recliner with drop arm, pt required minguard for safety with lateral scoot to recliner Toileting- Clothing Manipulation  and Hygiene: Maximal assistance, +2 for physical assistance, +2 for safety/equipment, Sitting/lateral lean, Sit to/from stand Functional mobility during ADLs: Min guard, Minimal assistance General ADL Comments: Pt limited by weakness, poor attention, decreased cognitive abilities and decreased strength requiring assist and sequencing assist for ADL  Cognition: Cognition Overall Cognitive Status: No family/caregiver present to determine baseline cognitive functioning Orientation Level: Oriented X4 Cognition Arousal/Alertness: Awake/alert Behavior During Therapy: WFL for tasks assessed/performed Overall Cognitive Status: No family/caregiver present to determine baseline cognitive functioning Area of Impairment: Orientation, Memory, Attention, Following commands, Safety/judgement, Awareness, Problem solving Orientation Level: Disoriented to, Place, Situation Current Attention Level: Sustained Memory: Decreased short-term memory Following Commands: Follows one step commands with increased time Safety/Judgement: Decreased awareness of safety, Decreased awareness of deficits Awareness: Intellectual Problem Solving: Slow processing, Decreased initiation, Requires verbal cues, Requires tactile cues General Comments: pt very appreciative to be out of bed and sitting in recliner    Blood pressure (!) 146/66, pulse 67, temperature 98.4 F (36.9 C), temperature source Oral, resp. rate 16, height 5\' 11"  (1.803 m), weight 95.6 kg, SpO2 94 %. Physical Exam  Nursing note and vitals reviewed. Constitutional: He appears well-developed and well-nourished. No distress.  Obese  HENT:  Head: Normocephalic and atraumatic.  Mouth/Throat: Oropharynx is clear and moist.  Eyes: Pupils are equal, round, and reactive to light. Right eye exhibits no discharge. Left eye exhibits no discharge.  Neck: Normal range of motion. No tracheal deviation present. No thyromegaly present.  Cardiovascular: Normal rate. Exam  reveals no friction rub.  No murmur heard. Respiratory: Effort normal. No respiratory distress. He has no wheezes.  GI: Soft. Bowel sounds are normal. He exhibits distension. There is no abdominal tenderness.  Musculoskeletal:        General: Edema present.     Comments: Right knee with mild-peripatellar edema. Left knee with large effusion, tender to touch, skin warm to touch. Left ankle tender as well with less swelling.  2+ pitting edema BLE and 1+RUE  Neurological: He is alert. No cranial nerve deficit.  UE 5/5. RLE 3+ /5 HF, 4/5 KE and 4+/5 ADF/PF. LLE 1-2/5 HF, KE (pain), 3/5 ADF/PF. No focal sensory findings.   Skin: Skin is warm and dry. He is not diaphoretic.  Psychiatric: He has a normal mood and affect. His behavior is normal. Judgment and thought content normal.    Results for orders placed or performed during the hospital encounter of 02/27/19 (from the past 48 hour(s))  Glucose, capillary     Status: Abnormal   Collection Time: 03/06/19 12:23 PM  Result Value Ref Range   Glucose-Capillary 183 (H) 70 - 99 mg/dL  Glucose, capillary     Status: Abnormal   Collection Time: 03/06/19  5:11 PM  Result Value Ref Range   Glucose-Capillary  216 (H) 70 - 99 mg/dL  Glucose, capillary     Status: Abnormal   Collection Time: 03/06/19  8:12 PM  Result Value Ref Range   Glucose-Capillary 259 (H) 70 - 99 mg/dL  Glucose, capillary     Status: Abnormal   Collection Time: 03/06/19 11:51 PM  Result Value Ref Range   Glucose-Capillary 146 (H) 70 - 99 mg/dL  Protime-INR     Status: Abnormal   Collection Time: 03/07/19  4:04 AM  Result Value Ref Range   Prothrombin Time 27.1 (H) 11.4 - 15.2 seconds   INR 2.6 (H) 0.8 - 1.2    Comment: (NOTE) INR goal varies based on device and disease states. Performed at [facility], [place] [address] [place], [place] [address]   Renal function panel     Status: Abnormal   Collection Time: 03/07/19  4:04 AM  Result Value Ref Range   Sodium 139  135 - 145 mmol/L   Potassium 3.4 (L) 3.5 - 5.1 mmol/L   Chloride 109 98 - 111 mmol/L   CO2 21 (L) 22 - 32 mmol/L   Glucose, Bld 192 (H) 70 - 99 mg/dL   BUN 23 8 - 23 mg/dL   Creatinine, Ser 1.11 0.61 - 1.24 mg/dL   Calcium 8.6 (L) 8.9 - 10.3 mg/dL   Phosphorus 1.7 (L) 2.5 - 4.6 mg/dL   Albumin 2.2 (L) 3.5 - 5.0 g/dL   GFR calc non Af Amer >60 >60 mL/min   GFR calc Af Amer >60 >60 mL/min   Anion gap 9 5 - 15    Comment: Performed at [facility] [address] [place], [place] [address]  CBC     Status: Abnormal   Collection Time: 03/07/19  4:04 AM  Result Value Ref Range   WBC 12.5 (H) 4.0 - 10.5 K/uL   RBC 3.66 (L) 4.22 - 5.81 MIL/uL   Hemoglobin 10.0 (L) 13.0 - 17.0 g/dL   HCT 31.2 (L) 39.0 - 52.0 %   MCV 85.2 80.0 - 100.0 fL   MCH 27.3 26.0 - 34.0 pg   MCHC 32.1 30.0 - 36.0 g/dL   RDW 15.9 (H) 11.5 - 15.5 %   Platelets 87 (L) 150 - 400 K/uL    Comment: REPEATED TO VERIFY Immature Platelet Fraction may be clinically indicated, consider ordering this additional test [id number] CONSISTENT WITH PREVIOUS RESULT    nRBC 0.0 0.0 - 0.2 %    Comment: Performed at [facility], [place] [address] [place], [place] [address]  Magnesium     Status: Abnormal   Collection Time: 03/07/19  4:04 AM  Result Value Ref Range   Magnesium 1.3 (L) 1.7 - 2.4 mg/dL    Comment: Performed at [facility] [address], [place], [place] [address]  Glucose, capillary     Status: Abnormal   Collection Time: 03/07/19  5:15 AM  Result Value Ref Range   Glucose-Capillary 171 (H) 70 - 99 mg/dL  Glucose, capillary     Status: Abnormal   Collection Time: 03/07/19  8:27 AM  Result Value Ref Range   Glucose-Capillary 151 (H) 70 - 99 mg/dL  Glucose, capillary     Status: Abnormal   Collection Time: 03/07/19 12:02 PM  Result Value Ref Range   Glucose-Capillary 239 (H) 70 - 99 mg/dL  Glucose, capillary     Status: Abnormal   Collection Time: 03/07/19  5:11 PM  Result Value Ref Range  Glucose-Capillary 216 (H) 70 - 99 mg/dL  Glucose, capillary     Status: Abnormal   Collection Time: 03/07/19  5:16 PM  Result Value Ref Range   Glucose-Capillary 202 (H) 70 - 99 mg/dL  Glucose, capillary     Status: Abnormal   Collection Time: 03/07/19  8:37 PM  Result Value Ref Range   Glucose-Capillary 252 (H) 70 - 99 mg/dL  Glucose, capillary     Status: Abnormal   Collection Time: 03/08/19 12:45 AM  Result Value Ref Range   Glucose-Capillary 316 (H) 70 - 99 mg/dL  Glucose, capillary     Status: Abnormal   Collection Time: 03/08/19  4:57 AM  Result Value Ref Range   Glucose-Capillary 154 (H) 70 - 99 mg/dL  Protime-INR     Status: Abnormal   Collection Time: 03/08/19  5:04 AM  Result Value Ref Range   Prothrombin Time 27.3 (H) 11.4 - 15.2 seconds   INR 2.6 (H) 0.8 - 1.2    Comment: (NOTE) INR goal varies based on device and disease states. Performed at [facility], [place] [address] [place], [place] [address]   Renal function panel     Status: Abnormal   Collection Time: 03/08/19  5:04 AM  Result Value Ref Range   Sodium 139 135 - 145 mmol/L   Potassium 3.7 3.5 - 5.1 mmol/L   Chloride 109 98 - 111 mmol/L   CO2 22 22 - 32 mmol/L   Glucose, Bld 158 (H) 70 - 99 mg/dL   BUN 19 8 - 23 mg/dL   Creatinine, Ser 1.02 0.61 - 1.24 mg/dL   Calcium 8.7 (L) 8.9 - 10.3 mg/dL   Phosphorus 2.3 (L) 2.5 - 4.6 mg/dL   Albumin 2.0 (L) 3.5 - 5.0 g/dL   GFR calc non Af Amer >60 >60 mL/min   GFR calc Af Amer >60 >60 mL/min   Anion gap 8 5 - 15    Comment: Performed at [facility] [address] [place], [place] [address]  CBC     Status: Abnormal   Collection Time: 03/08/19  5:04 AM  Result Value Ref Range   WBC 12.4 (H) 4.0 - 10.5 K/uL   RBC 3.56 (L) 4.22 - 5.81 MIL/uL   Hemoglobin 9.9 (L) 13.0 - 17.0 g/dL   HCT 30.6 (L) 39.0 - 52.0 %   MCV 86.0 80.0 - 100.0 fL   MCH 27.8 26.0 - 34.0 pg   MCHC 32.4 30.0 - 36.0 g/dL   RDW 16.0 (H) 11.5 - 15.5 %   Platelets 106 (L)  150 - 400 K/uL    Comment: REPEATED TO VERIFY Immature Platelet Fraction may be clinically indicated, consider ordering this additional test [id number] CONSISTENT WITH PREVIOUS RESULT    nRBC 0.2 0.0 - 0.2 %    Comment: Performed at [facility], [place] [address], [place], [place] [address]  Magnesium     Status: Abnormal   Collection Time: 03/08/19  5:04 AM  Result Value Ref Range   Magnesium 1.4 (L) 1.7 - 2.4 mg/dL    Comment: Performed at [facility] [address], [place], [place] [address]  Glucose, capillary     Status: None   Collection Time: 03/08/19  8:06 AM  Result Value Ref Range   Glucose-Capillary 92 70 - 99 mg/dL   No results found.     Medical Problem List and Plan: 1.  Functional and mobility deficits secondary to sepsis,  gout and multiple medical considerations. Resolving encephalopathy  -admit to inpatient rehab 2.  Antithrombotics: -DVT/anticoagulation:  Pharmaceutical: Coumadin  -antiplatelet therapy: N/A 3. Pain Management: tylenol prn 4. Mood: LCSW to follow for evaluation and support.   -antipsychotic agents: N/A 5. Neuropsych: This patient is capable of making decisions on his own behalf. 6. Skin/Wound Care: Routine pressure relief measures 7. Fluids/Electrolytes/Nutrition: Strict I/O. Daily weights. 8. T2DM: Was on actos, metformin and Januvia. Monitor BS ac/hs and continue SSI for now 9. 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  10. CAP:  Continue Cefepime--end date 6/13.  11. Cough: Continue IS.  12. Gout flare: Better with increased dose prednisone but was still in a good deal of pain today. Observe with therapies.   -continue prednisone, tapering to off.   13. Left prostatic mass/Right renal mass: follow up with GU on outpatient basis.      Bary Leriche, PA-C 03/08/2019

## 2019-03-08 NOTE — Progress Notes (Signed)
Physical Therapy Treatment Patient Details Name: Ryan Rice MRN: [id number] DOB: 07/08/1951 Today's Date: 03/08/2019    History of Present Illness Pt adm with respiratory failure due to PNA. Pt intubated 6/4 and extubated 6/7. PMH - ESRD with renal transplant, DM, HTN, DVT, PVD, gout    PT Comments    Patient remains very motivated to participate in therapy. He had more difficulty standing but once standing he was able to ambulate to the door and back to the chair. He ambulated with a slow but steady gait pattern. He tolerated exercises well. He would benefit from CIR.    Follow Up Recommendations  CIR     Equipment Recommendations  (TBD)    Recommendations for Other Services       Precautions / Restrictions Precautions Precautions: Fall Restrictions Weight Bearing Restrictions: No    Mobility  Bed Mobility Overal bed mobility: Needs Assistance Bed Mobility: Supine to Sit     Supine to sit: Supervision;HOB elevated     General bed mobility comments: increased time;supervision for safety  Transfers Overall transfer level: Needs assistance Equipment used: Rolling walker (2 wheeled)            Lateral/Scoot Transfers: Min assist General transfer comment: Sit to stand 2x. On the first trial he was unable to cath his balance. He reports he feels a Edelman weaker then eralier today. On the second trial he was able to maintain standing.   Ambulation/Gait Ambulation/Gait assistance: Mod assist;+2 safety/equipment Gait Distance (Feet): 15 Feet Assistive device: Rolling walker (2 wheeled) Gait Pattern/deviations: Step-to pattern;Decreased step length - right;Decreased step length - left;Shuffle;Trunk flexed;Drifts right/left Gait velocity: decr   General Gait Details: Assist with balance and support. Close chair follow. Legs unstable. Pt did better the longer he walked.  Needed constant cues for upright posture.  Drifts left needing assist and cues to steer to right.     Stairs             Wheelchair Mobility    Modified Rankin (Stroke Patients Only)       Balance Overall balance assessment: Needs assistance Sitting-balance support: No upper extremity supported;Feet supported Sitting balance-Leahy Scale: Fair Sitting balance - Comments: able to lean over to reach toes while sitting EOB, no LOB noted   Standing balance support: Bilateral upper extremity supported Standing balance-Leahy Scale: Poor Standing balance comment: RW and external support.                             Cognition Arousal/Alertness: Awake/alert Behavior During Therapy: WFL for tasks assessed/performed Overall Cognitive Status: No family/caregiver present to determine baseline cognitive functioning                                 General Comments: pt very appreciative to be out of bed and sitting in recliner       Exercises General Exercises - Lower Extremity Ankle Circles/Pumps: 20 reps Quad Sets: 20 reps Gluteal Sets: 20 reps Long Arc Quad: 20 reps Hip ABduction/ADduction: 20 reps(pillow squeeze and resited hip abduction )    General Comments        Pertinent Vitals/Pain Pain Assessment: Faces Faces Pain Scale: Hurts even more Pain Location: bilateral LE Pain Descriptors / Indicators: Discomfort;Guarding;Grimacing;Sore;Tender Pain Intervention(s): Limited activity within patient's tolerance    Home Living  Prior Function            PT Goals (current goals can now be found in the care plan section) Acute Rehab PT Goals Patient Stated Goal: to fish and get back to his company ([employer])    Frequency    Min 3X/week      PT Plan Current plan remains appropriate    Co-evaluation              AM-PAC PT "6 Clicks" Mobility   Outcome Measure  Help needed turning from your back to your side while in a flat bed without using bedrails?: None Help needed moving from lying on your  back to sitting on the side of a flat bed without using bedrails?: A Kimbell Help needed moving to and from a bed to a chair (including a wheelchair)?: A Lot Help needed standing up from a chair using your arms (e.g., wheelchair or bedside chair)?: A Lot Help needed to walk in hospital room?: A Lot Help needed climbing 3-5 steps with a railing? : Total 6 Click Score: 14    End of Session   Activity Tolerance: Patient limited by pain Patient left: in chair;with call bell/phone within reach Nurse Communication: Mobility status PT Visit Diagnosis: Other abnormalities of gait and mobility (R26.89);Muscle weakness (generalized) (M62.81)     Time: 9702-6378 PT Time Calculation (min) (ACUTE ONLY): 23 min  Charges:  $Gait Training: 8-22 mins $Therapeutic Exercise: 8-22 mins                        Carney Living PT DPT  03/08/2019, 3:24 PM

## 2019-03-08 NOTE — H&P (Signed)
Physical Medicine and Rehabilitation Admission H&P        Chief Complaint  Patient presents with  . Debility      HPI:  Ryan Rice is a 68 year old male with history of ESRD s/p renal transplant, CAF, T2DM, who was admitted on 02/27/2019 with diarrhea and abdominal pain.  He was noted to be hypoxic with increased WOB due to extensive left greater than right PNA with sepsis.  He was placed on BiPAP and started on broad-spectrum antibiotics however developed hypotension with worsening of respiratory status.  He was treated with pressors, stress dose steroids and was intubated on 6/04.  Nephrology consulted for input on acute on chronic renal failure with metabolic acidosis and he was treated with bicarb.  He tolerated extubation by 06/06 and urine output/renal status is improving.  Elevated troponins felt to be due to demand ischemia patient cardiology follow-up.  Abdominal ultrasound showed increased attenuation around the left prostate as well as mass arising from --he will need urology follow-up after discharge. His blood cultures were positive for Acinetobacter Calcoacetius and coag negative staph and antibiotics narrowed to Unasyn recommendation to complete 10 days total of antibiotic course.  Myfortic resumed 06/09.  He developed pain bilateral feet felt to be due to gout flare and prednisone increased to 40 mg for treatment on 6/10.  Hypokalemia hypomagnesemia have resolved with supplementation.  Therapy has been ongoing revealing debility.  CIR recommended due to functional deficits.     Review of Systems  Constitutional: Negative for fever.  HENT: Negative for hearing loss.   Eyes: Negative for blurred vision.  Respiratory: Positive for cough and shortness of breath.   Cardiovascular: Negative for palpitations and orthopnea.  Gastrointestinal: Negative for constipation, nausea and vomiting.  Genitourinary: Negative for dysuria and urgency.  Musculoskeletal: Positive for back  pain and joint pain.  Skin: Negative for rash.  Neurological: Positive for weakness. Negative for dizziness and headaches.  Psychiatric/Behavioral: Positive for depression. The patient is not nervous/anxious.           Past Medical History:  Diagnosis Date  . Allergic rhinitis    . BPH (benign prostatic hyperplasia)    . Chronic atrial fibrillation    . Diabetes mellitus ([place])    . DVT (deep venous thrombosis) ([facility])      LLE  . Hematochezia    . HTN (hypertension)    . Kidney transplant recipient    . PVD (peripheral vascular disease) ([place])            Past Surgical History:  Procedure Laterality Date  . KIDNEY TRANSPLANT              Family History  Problem Relation Age of Onset  . Stroke Mother    . Heart disease Father    . Diabetes Father    . Diabetes Sister    . Diabetes Brother    . Heart disease Brother       Social History: Married.  Retired.  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  He used to smoke half pack per day for 10 years--quit 11/86.  He uses alcohol on rare occasions.         Allergies  Allergen Reactions  . Iodinated Diagnostic Agents Hives  . Tape Hives           Medications Prior to Admission  Medication Sig Dispense Refill  . atorvastatin (  LIPITOR) 10 MG tablet Take 10 mg by mouth daily.      . cinacalcet (SENSIPAR) 60 MG tablet Take 60 mg by mouth daily.      . diclofenac sodium (VOLTAREN) 1 % GEL Apply 2 g topically 2 (two) times a day. Left hip      . dorzolamide-timolol (COSOPT) 22.3-6.8 MG/ML ophthalmic solution Place 1 drop into the right eye 2 (two) times a day.      . erythromycin ophthalmic ointment Place 1 application into both eyes at bedtime.      . famotidine (PEPCID) 20 MG tablet Take 20 mg by mouth 2 (two) times daily.      . finasteride (PROSCAR) 5 MG tablet Take 5 mg by mouth daily.      Marland Kitchen labetalol (NORMODYNE) 100 MG tablet Take 100 mg by mouth 2 (two) times daily.      .  magnesium oxide (MAG-OX) 400 MG tablet Take 400 mg by mouth daily.      . metFORMIN (GLUCOPHAGE) 500 MG tablet Take 500 mg by mouth 2 (two) times daily with a meal.       . mycophenolate (MYFORTIC) 180 MG EC tablet Take 360 mg by mouth 2 (two) times daily.       . nateglinide (STARLIX) 120 MG tablet Take 120 mg by mouth See admin instructions. Take 120mg  before lunch and dinner, skip dose if blood sugar is under 130.      Marland Kitchen NIFEdipine (PROCARDIA XL/NIFEDICAL XL) 60 MG 24 hr tablet Take 60 mg by mouth 2 (two) times a day.       . pioglitazone (ACTOS) 30 MG tablet Take 15 mg by mouth daily.       . predniSONE (DELTASONE) 10 MG tablet Take 10 mg by mouth daily with breakfast.      . sitaGLIPtin (JANUVIA) 100 MG tablet Take 100 mg by mouth daily.      Marland Kitchen sulfamethoxazole-trimethoprim (BACTRIM) 400-80 MG tablet Take 1 tablet by mouth every Monday, Wednesday, and Friday.       . tacrolimus (PROGRAF) 1 MG capsule Take 2 mg by mouth 2 (two) times daily.       . tamsulosin (FLOMAX) 0.4 MG CAPS capsule Take 0.4 mg by mouth daily after breakfast.       . warfarin (COUMADIN) 5 MG tablet Take 2.5-5 mg by mouth daily. Take 2.5mg  on MON TUES THUR FRI SAT, then take 5mg  on WED and SUN.         Drug Regimen Review  Drug regimen was reviewed and remains appropriate with no significant issues identified   Home: Home Living Family/patient expects to be discharged to:: Private residence Living Arrangements: Spouse/significant other Available Help at Discharge: Family, Available 24 hours/day Type of Home: House Home Access: Stairs to enter [employer] of Steps: 3 Entrance Stairs-Rails: None Home Layout: One level Bathroom Shower/Tub: Chiropodist: Standard Home Equipment: Environmental consultant - 2 wheels   Functional History: Prior Function Level of Independence: Independent Comments: Mowed yard on day of admit   Functional Status:  Mobility: Bed Mobility Overal bed mobility: Needs  Assistance Bed Mobility: Supine to Sit Supine to sit: Supervision, HOB elevated General bed mobility comments: increased time;supervision for safety Transfers Overall transfer level: Needs assistance Equipment used: Rolling walker (2 wheeled) Transfers: Lateral/Scoot Transfers Sit to Stand: Max assist, +2 physical assistance, +2 safety/equipment, Mod assist Stand pivot transfers: Max assist, +2 physical assistance, +2 safety/equipment  Lateral/Scoot Transfers: Min guard General transfer comment:  due to pain in bilateral feet, unable to come into standing this session;minguard for safety;pt scooted from EOB to recliner, DoE 2/4 Ambulation/Gait Ambulation/Gait assistance: Mod assist, +2 safety/equipment Gait Distance (Feet): 75 Feet Assistive device: Rolling walker (2 wheeled) Gait Pattern/deviations: Step-to pattern, Decreased step length - right, Decreased step length - left, Shuffle, Trunk flexed, Drifts right/left General Gait Details: Assist with balance and support. Close chair follow. Legs unstable. Pt did better the longer he walked.  Needed constant cues for upright posture.  Drifts left needing assist and cues to steer to right.  Gait velocity: decr Gait velocity interpretation: <1.31 ft/sec, indicative of household ambulator     ADL: ADL Overall ADL's : Needs assistance/impaired Grooming: Wash/dry hands, Wash/dry face, Set up, Sitting Upper Body Bathing: Minimal assistance, Sitting Lower Body Bathing: Maximal assistance, Sitting/lateral leans, Sit to/from stand, Cueing for sequencing, Cueing for safety Upper Body Dressing : Minimal assistance, Sitting Lower Body Dressing: Maximal assistance Lower Body Dressing Details (indicate cue type and reason): pt required assistance to don socks;pt able to reach to mid-foot when long-sitting in bed Toilet Transfer: Requires drop arm, Min guard Toilet Transfer Details (indicate cue type and reason): simulated transfer from EOB to  recliner with drop arm, pt required minguard for safety with lateral scoot to recliner Toileting- Clothing Manipulation and Hygiene: Maximal assistance, +2 for physical assistance, +2 for safety/equipment, Sitting/lateral lean, Sit to/from stand Functional mobility during ADLs: Min guard, Minimal assistance General ADL Comments: Pt limited by weakness, poor attention, decreased cognitive abilities and decreased strength requiring assist and sequencing assist for ADL   Cognition: Cognition Overall Cognitive Status: No family/caregiver present to determine baseline cognitive functioning Orientation Level: Oriented X4 Cognition Arousal/Alertness: Awake/alert Behavior During Therapy: WFL for tasks assessed/performed Overall Cognitive Status: No family/caregiver present to determine baseline cognitive functioning Area of Impairment: Orientation, Memory, Attention, Following commands, Safety/judgement, Awareness, Problem solving Orientation Level: Disoriented to, Place, Situation Current Attention Level: Sustained Memory: Decreased short-term memory Following Commands: Follows one step commands with increased time Safety/Judgement: Decreased awareness of safety, Decreased awareness of deficits Awareness: Intellectual Problem Solving: Slow processing, Decreased initiation, Requires verbal cues, Requires tactile cues General Comments: pt very appreciative to be out of bed and sitting in recliner      Blood pressure (!) 146/66, pulse 67, temperature 98.4 F (36.9 C), temperature source Oral, resp. rate 16, height 5\' 11"  (1.803 m), weight 95.6 kg, SpO2 94 %. Physical Exam  Nursing note and vitals reviewed. Constitutional: He appears well-developed and well-nourished. No distress.  Obese  HENT:  Head: Normocephalic and atraumatic.  Mouth/Throat: Oropharynx is clear and moist.  Eyes: Pupils are equal, round, and reactive to light. Right eye exhibits no discharge. Left eye exhibits no discharge.   Neck: Normal range of motion. No tracheal deviation present. No thyromegaly present.  Cardiovascular: Normal rate. Exam reveals no friction rub.  No murmur heard. Respiratory: Effort normal. No respiratory distress. He has no wheezes.  GI: Soft. Bowel sounds are normal. He exhibits distension. There is no abdominal tenderness.  Musculoskeletal:        General: Edema present.     Comments: Right knee with mild-peripatellar edema. Left knee with large effusion, tender to touch, skin warm to touch. Left ankle tender as well with less swelling.  2+ pitting edema BLE and 1+RUE  Neurological: He is alert. No cranial nerve deficit.  UE 5/5. RLE 3+ /5 HF, 4/5 KE and 4+/5 ADF/PF. LLE 1-2/5 HF, KE (pain), 3/5 ADF/PF. No focal sensory  findings.   Skin: Skin is warm and dry. He is not diaphoretic.  Psychiatric: He has a normal mood and affect. His behavior is normal. Judgment and thought content normal.      Lab Results Last 48 Hours        Results for orders placed or performed during the hospital encounter of 02/27/19 (from the past 48 hour(s))  Glucose, capillary     Status: Abnormal    Collection Time: 03/06/19 12:23 PM  Result Value Ref Range    Glucose-Capillary 183 (H) 70 - 99 mg/dL  Glucose, capillary     Status: Abnormal    Collection Time: 03/06/19  5:11 PM  Result Value Ref Range    Glucose-Capillary 216 (H) 70 - 99 mg/dL  Glucose, capillary     Status: Abnormal    Collection Time: 03/06/19  8:12 PM  Result Value Ref Range    Glucose-Capillary 259 (H) 70 - 99 mg/dL  Glucose, capillary     Status: Abnormal    Collection Time: 03/06/19 11:51 PM  Result Value Ref Range    Glucose-Capillary 146 (H) 70 - 99 mg/dL  Protime-INR     Status: Abnormal    Collection Time: 03/07/19  4:04 AM  Result Value Ref Range    Prothrombin Time 27.1 (H) 11.4 - 15.2 seconds    INR 2.6 (H) 0.8 - 1.2      Comment: (NOTE) INR goal varies based on device and disease states. Performed at [facility], [place] [address], [place], [place] [address]    Renal function panel     Status: Abnormal    Collection Time: 03/07/19  4:04 AM  Result Value Ref Range    Sodium 139 135 - 145 mmol/L    Potassium 3.4 (L) 3.5 - 5.1 mmol/L    Chloride 109 98 - 111 mmol/L    CO2 21 (L) 22 - 32 mmol/L    Glucose, Bld 192 (H) 70 - 99 mg/dL    BUN 23 8 - 23 mg/dL    Creatinine, Ser 1.11 0.61 - 1.24 mg/dL    Calcium 8.6 (L) 8.9 - 10.3 mg/dL    Phosphorus 1.7 (L) 2.5 - 4.6 mg/dL    Albumin 2.2 (L) 3.5 - 5.0 g/dL    GFR calc non Af Amer >60 >60 mL/min    GFR calc Af Amer >60 >60 mL/min    Anion gap 9 5 - 15      Comment: Performed at [facility] [address] [place], [place] [address]  CBC     Status: Abnormal    Collection Time: 03/07/19  4:04 AM  Result Value Ref Range    WBC 12.5 (H) 4.0 - 10.5 K/uL    RBC 3.66 (L) 4.22 - 5.81 MIL/uL    Hemoglobin 10.0 (L) 13.0 - 17.0 g/dL    HCT 31.2 (L) 39.0 - 52.0 %    MCV 85.2 80.0 - 100.0 fL    MCH 27.3 26.0 - 34.0 pg    MCHC 32.1 30.0 - 36.0 g/dL    RDW 15.9 (H) 11.5 - 15.5 %    Platelets 87 (L) 150 - 400 K/uL      Comment: REPEATED TO VERIFY Immature Platelet Fraction may be clinically indicated, consider ordering this additional test [id number] CONSISTENT WITH PREVIOUS RESULT      nRBC 0.0 0.0 - 0.2 %      Comment: Performed at [facility], [place]  [address] [place], [place] [address]  Magnesium     Status: Abnormal    Collection Time: 03/07/19  4:04 AM  Result Value Ref Range    Magnesium 1.3 (L) 1.7 - 2.4 mg/dL      Comment: Performed at [facility] [address], [place], [place] [address]  Glucose, capillary     Status: Abnormal    Collection Time: 03/07/19  5:15 AM  Result Value Ref Range    Glucose-Capillary 171 (H) 70 - 99 mg/dL  Glucose, capillary     Status: Abnormal    Collection Time: 03/07/19  8:27 AM  Result Value Ref Range    Glucose-Capillary 151 (H) 70 - 99 mg/dL  Glucose, capillary     Status:  Abnormal    Collection Time: 03/07/19 12:02 PM  Result Value Ref Range    Glucose-Capillary 239 (H) 70 - 99 mg/dL  Glucose, capillary     Status: Abnormal    Collection Time: 03/07/19  5:11 PM  Result Value Ref Range    Glucose-Capillary 216 (H) 70 - 99 mg/dL  Glucose, capillary     Status: Abnormal    Collection Time: 03/07/19  5:16 PM  Result Value Ref Range    Glucose-Capillary 202 (H) 70 - 99 mg/dL  Glucose, capillary     Status: Abnormal    Collection Time: 03/07/19  8:37 PM  Result Value Ref Range    Glucose-Capillary 252 (H) 70 - 99 mg/dL  Glucose, capillary     Status: Abnormal    Collection Time: 03/08/19 12:45 AM  Result Value Ref Range    Glucose-Capillary 316 (H) 70 - 99 mg/dL  Glucose, capillary     Status: Abnormal    Collection Time: 03/08/19  4:57 AM  Result Value Ref Range    Glucose-Capillary 154 (H) 70 - 99 mg/dL  Protime-INR     Status: Abnormal    Collection Time: 03/08/19  5:04 AM  Result Value Ref Range    Prothrombin Time 27.3 (H) 11.4 - 15.2 seconds    INR 2.6 (H) 0.8 - 1.2      Comment: (NOTE) INR goal varies based on device and disease states. Performed at [facility], [place] [address], [place], [place] [address]    Renal function panel     Status: Abnormal    Collection Time: 03/08/19  5:04 AM  Result Value Ref Range    Sodium 139 135 - 145 mmol/L    Potassium 3.7 3.5 - 5.1 mmol/L    Chloride 109 98 - 111 mmol/L    CO2 22 22 - 32 mmol/L    Glucose, Bld 158 (H) 70 - 99 mg/dL    BUN 19 8 - 23 mg/dL    Creatinine, Ser 1.02 0.61 - 1.24 mg/dL    Calcium 8.7 (L) 8.9 - 10.3 mg/dL    Phosphorus 2.3 (L) 2.5 - 4.6 mg/dL    Albumin 2.0 (L) 3.5 - 5.0 g/dL    GFR calc non Af Amer >60 >60 mL/min    GFR calc Af Amer >60 >60 mL/min    Anion gap 8 5 - 15      Comment: Performed at [facility] [address] [place] [address]  CBC     Status: Abnormal    Collection Time: 03/08/19  5:04 AM  Result Value Ref Range    WBC 12.4 (H)  4.0 - 10.5 K/uL    RBC 3.56 (L)  4.22 - 5.81 MIL/uL    Hemoglobin 9.9 (L) 13.0 - 17.0 g/dL    HCT 30.6 (L) 39.0 - 52.0 %    MCV 86.0 80.0 - 100.0 fL    MCH 27.8 26.0 - 34.0 pg    MCHC 32.4 30.0 - 36.0 g/dL    RDW 16.0 (H) 11.5 - 15.5 %    Platelets 106 (L) 150 - 400 K/uL      Comment: REPEATED TO VERIFY Immature Platelet Fraction may be clinically indicated, consider ordering this additional test [id number] CONSISTENT WITH PREVIOUS RESULT      nRBC 0.2 0.0 - 0.2 %      Comment: Performed at [facility], [place] [address], [place], [place] [address]  Magnesium     Status: Abnormal    Collection Time: 03/08/19  5:04 AM  Result Value Ref Range    Magnesium 1.4 (L) 1.7 - 2.4 mg/dL      Comment: Performed at [facility] [address], [place], [place] [address]  Glucose, capillary     Status: None    Collection Time: 03/08/19  8:06 AM  Result Value Ref Range    Glucose-Capillary 92 70 - 99 mg/dL     Imaging Results (Last 48 hours)  No results found.           Medical Problem List and Plan: 1.  Functional and mobility deficits secondary to sepsis, gout and multiple medical considerations. Resolving encephalopathy             -admit to inpatient rehab 2.  Antithrombotics: -DVT/anticoagulation:  Pharmaceutical: Coumadin             -antiplatelet therapy: N/A 3. Pain Management: tylenol prn 4. Mood: LCSW to follow for evaluation and support.              -antipsychotic agents: N/A 5. Neuropsych: This patient is capable of making decisions on his own behalf. 6. Skin/Wound Care: Routine pressure relief measures 7. Fluids/Electrolytes/Nutrition: Strict I/O. Daily weights. 8. T2DM: Was on actos, metformin and Januvia. Monitor BS ac/hs and continue SSI for now 9. 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  10. CAP:  Continue Cefepime--end date 6/13.  11. Cough: Continue IS.  12. Gout flare: Better with increased dose prednisone but was  still in a good deal of pain today. Observe with therapies.              -continue prednisone, tapering to off.   13. Left prostatic mass/Right renal mass: follow up with GU on outpatient basis.     Post Admission Physician Evaluation: 1. Functional deficits secondary  to debility. 2. Patient is admitted to receive collaborative, interdisciplinary care between the physiatrist, rehab nursing staff, and therapy team. 3. Patient's level of medical complexity and substantial therapy needs in context of that medical necessity cannot be provided at a lesser intensity of care such as a SNF. 4. Patient has experienced substantial functional loss from his/her baseline which was documented above under the "Functional History" and "Functional Status" headings.  Judging by the patient's diagnosis, physical exam, and functional history, the patient has potential for functional progress which will result in measurable gains while on inpatient rehab.  These gains will be of substantial and practical use upon discharge  in facilitating mobility and self-care at the household level. 5. Physiatrist will provide 24 hour management of medical needs as well as oversight of the therapy plan/treatment and provide guidance  as appropriate regarding the interaction of the two. 6. The Preadmission Screening has been reviewed and patient status is unchanged unless otherwise stated above. 7. 24 hour rehab nursing will assist with bladder management, bowel management, safety, skin/wound care, disease management, medication administration, pain management and patient education  and help integrate therapy concepts, techniques,education, etc. 8. PT will assess and treat for/with: Lower extremity strength, range of motion, stamina, balance, functional mobility, safety, adaptive techniques and equipment, NMR, pain mgt.   Goals are: mod I to supervision. 9. OT will assess and treat for/with: ADL's, functional mobility, safety, upper  extremity strength, adaptive techniques and equipment, NMR family ed.   Goals are: mod I to supervision. Therapy may proceed with showering this patient. 10. SLP will assess and treat for/with: n/a.  Goals are: n/a. 11. Case Management and Social Worker will assess and treat for psychological issues and discharge planning. 12. Team conference will be held weekly to assess progress toward goals and to determine barriers to discharge. 13. Patient will receive at least 3 hours of therapy per day at least 5 days per week. 14. ELOS: 11-16 days       15. Prognosis:  excellent   I have personally performed a face to face diagnostic evaluation of this patient and formulated the key components of the plan.  Additionally, I have personally reviewed laboratory data, imaging studies, as well as relevant notes and concur with the physician assistant's documentation above.  Meredith Staggers, MD, [facility] Drown     Bary Leriche, PA-C 03/08/2019

## 2019-03-08 NOTE — Discharge Summary (Addendum)
. Physician Discharge Summary  Elena Cothern GYF:[id number] DOB: 08-Jul-1951 DOA: 02/27/2019  PCP: System, Provider Not In  Admit date: 02/27/2019 Discharge date: 03/08/2019  Admitted From: Home Disposition:  Discharged to CIR.  Recommendations for Outpatient Follow-up:  1. Follow up with PCP in 1-2 weeks 2. Please obtain BMP/CBC in one week     Discharge Condition: Stable  CODE STATUS: FULL    Brief/Interim Summary: Jarrett Albor a 68 y.o.malewho has a PMH including but not limited to ESRD s/p renal transplant, DM, HTN, HLD, DVT on coumadin. Hepresented to [facility] 6/3 with abdominal pain. Had diarrhea that started the night prior after he had cut his grass. Did report mild SOB and mild non productive cough. No fevers/chills/sweats.  In ED, he was found to be hypoxic with slightly increased WOB. He was placed on NRB to maintain SpO2 in 90s. CXR demonstrated LLL opacity. CT A/P demonstrated extensive PNA bilaterally L >R. Initial rapid COVID negative.  Due to increased O2 needs and concern for deterioration, he was transferred to [facility] for further evaluation and management. Admitted to ICU.  Transferred to [facility] on 6/8.  Pt admitted by ICU for multifocal pneumonia. He required mechanical ventilation and vasopressors for shock. Now improved. Pt transferred to [facility] on 6/8. Completed course of abx. Pending CIR.  Discharge Diagnoses:  Active Problems:   Sepsis ([place])   Acute renal failure (ARF) ([place])   Community acquired pneumonia of left lower lobe of lung ([place])   Kidney transplant recipient  Acinetobacter Bacteremia/Multifocal Pneumonia/Vent Dependent Resp Failure:  - Immunocompromised with hx renal transplant - Blood cx 6/3 with acinetobacter (1/2 with coag negative staph, suspect contaminant) - Cefepime 6/3 - 6/5; Unasyn 6/6 - 6/9; 7 day abx course completed, abx d/c'd; Unasyn resumed to complete 10 days (END 6/13 AM) - Blood cx 6/8 NGTD -  afebrile, WBC stable - continue steroid taper  Acute on CKD/Hx of Renal transplant:  - per renal note, creatinine 1.44 prior to admission. Baseline ~1.7 per renal notes a few months ago. - Peaked at 2.41; now resolved - Appreciate renal recs - Continue prograf and tapering stress dose steroids - Myfortic resumed - baseline CKD stage unclear; but GFR suggests that he is CKD 2 or better  Acute Encephalopathy - likely delirium 2/2 hospitalization - appropriate interaction this AM  Hx DVT/Supratherapeutic INR on presentation - s/p vitamin K and FFP at presentation due to supratherapeutic INR.  - Warfarin per pharmacy  Elevated Troponin:  - peaked at 0.34.  - thought 2/2 demand ischemia with sepsis in setting of aki on CKD.  - Echo with mildly reduced EF (was normal on 3/12).  - No comment on WMA.  - Had stress test on 12/06/2018 without evidence of ischemia. - Will need outpatient cards f/u - A1c (6.2), lipids (LDL <70) - on atorvastatin 10 at home  1.3 by 1.3 cm focus of decreased attenuation along leftward aspect of prostate:  - PSA is elevated - Discussed with urology who note pt needs biopsy and outpatient urology follow up - Urology will help with setting up follow up, need to ensure he follows up as planned  Noncystic Mass Arising from native kidney on R: - needs nonemergent MR of native kidney   Hypokalemia/Hypomagnesemia/hypophosphatemia: - replace and follow  Thrombocytopenia: - continue to monitor  Left Ankle Pain:  - pt suspects gout flare, improving at this time.  - He's currently on steroids.  - CTM on steroids.  - Follow uric acid.  -  L ankle plain film without acute abnormality.     - says pain is much better today; continue steroid taper  HTN     - amlodipine, labetolol  Discharge Instructions 1. Follow up with PCP in 1  week.  Allergies as of 03/08/2019      Reactions   Iodinated Diagnostic Agents Hives   Tape Hives      Medication List    STOP taking these medications   atorvastatin 10 MG tablet Commonly known as: LIPITOR   diclofenac sodium 1 % Gel Commonly known as: VOLTAREN   erythromycin ophthalmic ointment   metFORMIN 500 MG tablet Commonly known as: GLUCOPHAGE   nateglinide 120 MG tablet Commonly known as: STARLIX   NIFEdipine 60 MG 24 hr tablet Commonly known as: PROCARDIA XL/NIFEDICAL XL   pioglitazone 30 MG tablet Commonly known as: ACTOS   sitaGLIPtin 100 MG tablet Commonly known as: JANUVIA     TAKE these medications   amLODipine 5 MG tablet Commonly known as: NORVASC Take 1 tablet (5 mg total) by mouth daily. Start taking on: March 09, 2019   Ampicillin-Sulbactam 3 g in sodium chloride 0.9 % 100 mL Inject 3 g into the vein every 6 (six) hours for 1 day.   benzonatate 100 MG capsule Commonly known as: TESSALON Take 1 capsule (100 mg total) by mouth 3 (three) times daily as needed for cough.   cinacalcet 60 MG tablet Commonly known as: SENSIPAR Take 60 mg by mouth daily.   dorzolamide-timolol 22.3-6.8 MG/ML ophthalmic solution Commonly known as: COSOPT Place 1 drop into the right eye 2 (two) times a day.   famotidine 20 MG tablet Commonly known as: PEPCID Take 20 mg by mouth 2 (two) times daily.   finasteride 5 MG tablet Commonly known as: PROSCAR Take 5 mg by mouth daily.   labetalol 100 MG tablet Commonly known as: NORMODYNE Take 1 tablet (100 mg total) by mouth 2 (two) times daily.   magnesium oxide 400 MG tablet Commonly known as: MAG-OX Take 400 mg by mouth daily.   mycophenolate 180 MG EC tablet Commonly known as: MYFORTIC Take 360 mg by mouth 2 (two) times daily.   potassium & sodium phosphates 280-160-250 MG Pack Commonly known as: PHOS-NAK Take 1 packet by mouth 4 (four) times daily -  with meals and at bedtime for 1 day.   predniSONE  10 MG tablet Commonly known as: DELTASONE Take 10 mg by mouth daily with breakfast.   sulfamethoxazole-trimethoprim 400-80 MG tablet Commonly known as: BACTRIM Take 1 tablet by mouth every Monday, Wednesday, and Friday.   tacrolimus 1 MG capsule Commonly known as: PROGRAF Take 2 mg by mouth 2 (two) times daily.   tamsulosin 0.4 MG Caps capsule Commonly known as: FLOMAX Take 0.4 mg by mouth daily after breakfast.   thiamine 250 MG tablet Take 1 tablet (250 mg total) by mouth daily. Start taking on: March 09, 2019   warfarin 5 MG tablet Commonly known as: COUMADIN Take 2.5-5 mg by mouth daily. Take 2.5mg  on MON TUES THUR FRI SAT, then take 5mg  on WED and SUN.       Allergies  Allergen Reactions  . Iodinated Diagnostic Agents Hives  . Tape Hives    Consultations:  Nephrology  PCCM   Procedures/Studies: Dg Ankle Complete Left  Result Date: 03/05/2019 CLINICAL DATA:  Soft tissue swelling and pain EXAM: LEFT ANKLE COMPLETE - 3+ VIEW COMPARISON:  None. FINDINGS: Frontal, oblique, and lateral views were obtained. There  is no fracture or joint effusion. 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 No erosive changes evident. The ankle mortise appears intact. There is widespread arterial vascular calcification. IMPRESSION: Osteoarthritic change medially. No fracture. Ankle mortise appears intact. There is widespread arterial vascular calcification, likely due to known diabetes mellitus. Electronically Signed   By: Lowella Grip III M.D.   On: 03/05/2019 11:21   US Renal Transplant W/doppler  Result Date: 03/01/2019 CLINICAL DATA:  Acute renal failure. EXAM: ULTRASOUND OF RENAL TRANSPLANT WITH RENAL DOPPLER ULTRASOUND TECHNIQUE: Ultrasound examination of the renal transplant was performed with gray-scale, color and duplex doppler evaluation. COMPARISON:  CT dated 02/27/2019. FINDINGS: Transplant kidney location: Right pelvis. Transplant Kidney: Renal measurements:  10 x 6.1 x 7.5 cm = volume: 213mL. Normal in size and parenchymal echogenicity. No evidence of mass or hydronephrosis. No peri-transplant fluid collection seen. Color flow in the main renal artery:  Yes Color flow in the main renal vein:  Yes Duplex Doppler Evaluation: Main Renal Artery Resistive Index: 0.76 Venous waveform in main renal vein:  Present Intrarenal resistive index in upper pole:  0.71 (normal 0.6-0.8; equivocal 0.8-0.9; abnormal >= 0.9) Intrarenal resistive index in lower pole: 0.69 (normal 0.6-0.8; equivocal 0.8-0.9; abnormal >= 0.9) Bladder: Decompressed with a Foley catheter. Other findings: There is a small amount of free fluid in the right lower quadrant which is new since prior CT. IMPRESSION: 1. Unremarkable appearance of the right lower quadrant transplant kidney without evidence of hydronephrosis. The resistive indices are within the normal range. 2. Increasing free fluid in the right lower quadrant when compared to CT dated 02/27/2019. Electronically Signed   By: Constance Holster M.D.   On: 03/01/2019 19:19   Ct Abdomen Pelvis W Contrast  Result Date: 02/27/2019 CLINICAL DATA:  Abdominal pain and diarrhea EXAM: CT ABDOMEN AND PELVIS WITH CONTRAST TECHNIQUE: Multidetector CT imaging of the abdomen and pelvis was performed using the standard protocol following bolus administration of intravenous contrast. CONTRAST:  80 mL OMNIPAQUE IOHEXOL 300 MG/ML  SOLN COMPARISON:  None. FINDINGS: Lower chest: There is extensive airspace consolidation in the inferior lingula as well as throughout the left lower lobe. More patchy infiltrate is noted in the right middle lobe and posterior left base regions. There is extensive coronary artery calcification evident. Hepatobiliary: No focal liver lesions are appreciable. The gallbladder is mildly distended. There is cholelithiasis. The gallbladder wall does not appear appreciably thickened by CT. There is no evident biliary duct dilatation. Pancreas: There  is no pancreatic mass or inflammatory focus. Spleen: No splenic lesions are evident. Adrenals/Urinary Tract: Adrenals appear normal. The native kidneys show cortical thinning and increased renal sinus fat consistent with chronic atrophy. There are small cysts in each kidney. There is a noncystic mass with calcification along the lateral mid right native kidney measuring 1.3 x 1.1 cm. There are foci of arterial vascular calcification in each native kidney. There is no native kidney hydronephrosis. There is a nonobstructing calculus measuring 6 x 3 mm in the lower pole of the native right kidney. There is a transplant kidney in the right upper pelvis. This kidney shows normal attenuation and has normal cortical thickness. There is no evident mass or hydronephrosis associated with transplant kidney. No renal or ureteral calculus evident. Urinary bladder midline. Stomach/Bowel: There are multiple colonic diverticula without evident diverticulitis. There is no appreciable bowel wall or mesenteric thickening. No evident bowel obstruction. No free air or portal venous air is evident. Terminal ileum appears unremarkable. Vascular/Lymphatic:  There is extensive aortic and iliac artery atherosclerosis. There is calcification throughout major pelvic arterial vessels. No aneurysm evident. No adenopathy is appreciable in the abdomen or pelvis. Reproductive: The prostate appears prominent. There is a decreased attenuation mass along the anterior aspect of the prostate on the left measuring 1.3 x 1.3 cm. Seminal vesicles appear unremarkable. Other: Appendix appears normal. No abscess or ascites is evident in the abdomen or pelvis. There is a small ventral hernia containing fat but no bowel. Musculoskeletal: Bones are somewhat osteoporotic. There is extensive degenerative change in each hip joint as well as in the lumbar spine. No blastic or lytic bone lesions are evident. There is no intramuscular lesion appreciable. IMPRESSION: 1.  Extensive pneumonia, most severe in the inferior lingula and left lower lobe with more patchy infiltrate in the right middle lobe and posterior segment right lower lobe. 2. There is a 1.3 x 1.3 cm focus of decreased attenuation along the leftward aspect of prostate anteriorly. A small prostatic neoplasm could present in this manner. Infectious lesion also could present in this manner. This finding warrants urologic assessment and correlation with PSA. 3. Normal appearing transplant kidney in the right lower quadrant. Atrophic native kidneys. Note that there is a noncystic mass arising from the lateral native kidney on the right. A small neoplasm in this area cannot be excluded. Nonemergent MR of the native kidneys pre and post-contrast may well be advisable given this concern. 4. Colonic diverticulosis without diverticulitis. No bowel obstruction. No abscess in the abdomen or pelvis. Appendix appears normal. 5.  Cholelithiasis.  Gallbladder mildly distended. 6. Extensive aortic and pelvic arterial atherosclerosis. Multiple foci of coronary artery calcification also noted. 7.  Small ventral hernia containing only fat. Electronically Signed   By: Lowella Grip III M.D.   On: 02/27/2019 19:23   Dg Chest Port 1 View  Result Date: 03/02/2019 CLINICAL DATA:  Respiratory failure EXAM: PORTABLE CHEST 1 VIEW COMPARISON:  Chest radiograph 03/01/2019 FINDINGS: ET tube terminates in the mid trachea. Monitoring leads overlie the patient. Enteric tube courses inferior to the diaphragm. Low lung volumes. Cardiomegaly. Aortic atherosclerosis. Similar-appearing left greater than right airspace opacities. Small bilateral pleural effusions. No pneumothorax. IMPRESSION: Stable support apparatus. Re demonstrated consolidation within the left mid and lower lung and right lung base. Small bilateral pleural effusions. Electronically Signed   By: Lovey Newcomer M.D.   On: 03/02/2019 07:38   Dg Chest Port 1 View  Result Date:  03/01/2019 CLINICAL DATA:  68 year old male with respiratory failure. COVID-19 status pending. EXAM: PORTABLE CHEST 1 VIEW COMPARISON:  02/28/2019 and earlier. FINDINGS: Portable AP semi upright view at 0515 hours. ETT tip between the level the clavicles and carina. Stable left IJ central line. Enteric tube courses to the abdomen, tip not included. Lower lung volumes. Continued confluent left mid and lower lung opacity, although lung base ventilation has improved since 02/27/2019. Pulmonary vascular congestion without overt edema. Mild streaky opacity at the right lung base. Stable cardiac size and mediastinal contours. Calcified aortic atherosclerosis. No pneumothorax. No large pleural effusion. IMPRESSION: 1.  Stable lines and tubes. 2. Large area of left lung pneumonia with mild improvement since 02/27/2019. 3. Patchy opacity at the right lung base, favor atelectasis. Electronically Signed   By: Genevie Ann M.D.   On: 03/01/2019 08:21   Dg Chest Port 1 View  Result Date: 02/28/2019 CLINICAL DATA:  Check central line placement EXAM: PORTABLE CHEST 1 VIEW COMPARISON:  02/27/2019 FINDINGS: Endotracheal tube, gastric catheter and  left jugular central line are noted in place. The left jugular tip lies in the proximal superior vena cava. Extensive left lower lobe pneumonia is noted similar to that seen on prior plain film and CT. Minimal right basilar atelectasis is noted. IMPRESSION: Stable left basilar infiltrate with new mild right basilar atelectasis. Tubes and lines as described. Electronically Signed   By: Inez Catalina M.D.   On: 02/28/2019 07:03   Dg Chest Port 1 View  Result Date: 02/27/2019 CLINICAL DATA:  Fever, shortness of breath. EXAM: PORTABLE CHEST 1 VIEW COMPARISON:  None. FINDINGS: Mild cardiomegaly is noted. No pneumothorax is noted. Right lung is clear. Atherosclerosis of thoracic aorta is noted. Large left lower lobe airspace opacity is noted most consistent with pneumonia. Bony thorax is  unremarkable. IMPRESSION: Large left lower lobe airspace opacity consistent with pneumonia. Followup PA and lateral chest X-ray is recommended in 3-4 weeks following trial of antibiotic therapy to ensure resolution and exclude underlying malignancy. Aortic Atherosclerosis (ICD10-I70.0). Electronically Signed   By: Marijo Conception M.D.   On: 02/27/2019 16:58      Subjective: "I feel pretty good today, doc."  Discharge Exam: Vitals:   03/07/19 2040 03/08/19 0500  BP: (!) 130/54 (!) 146/66  Pulse: 93 67  Resp: 16 16  Temp: 98.7 F (37.1 C) 98.4 F (36.9 C)  SpO2: 93% 94%   Vitals:   03/07/19 1337 03/07/19 2040 03/08/19 0500 03/08/19 0639  BP: (!) 153/64 (!) 130/54 (!) 146/66   Pulse: 76 93 67   Resp: 17 16 16    Temp: 99.3 F (37.4 C) 98.7 F (37.1 C) 98.4 F (36.9 C)   TempSrc: Oral Oral Oral   SpO2: 92% 93% 94%   Weight:    95.6 kg  Height:        General:67 y.o.maleresting in bed in NAD Cardiovascular: RRR, +S1, S2, no m/g/r, equal pulses throughout Respiratory: CTABL, no w/r/r, normal WOB GI: BS+, NDNT, no masses noted, no organomegaly noted MSK: No c/c, BLE edema Skin: No rashes, bruises, ulcerations noted Neuro: A&O x 3, no focal deficits    The results of significant diagnostics from this hospitalization (including imaging, microbiology, ancillary and laboratory) are listed below for reference.     Microbiology: Recent Results (from the past 240 hour(s))  Blood culture (routine x 2)     Status: Abnormal   Collection Time: 02/27/19  3:35 PM   Specimen: BLOOD  Result Value Ref Range Status   Specimen Description   Final    BLOOD RIGHT ANTECUBITAL Performed at [facility], [address], [place], [place] [address]    Special Requests   Final    BOTTLES DRAWN AEROBIC AND ANAEROBIC Blood Culture adequate volume Performed at [facility], [address] [place], [place] [address]    Culture  Setup Time   Final    GRAM NEGATIVE  RODS AEROBIC BOTTLE ONLY CRITICAL VALUE NOTED.  VALUE IS CONSISTENT WITH PREVIOUSLY REPORTED AND CALLED VALUE.    Culture (A)  Final    ACINETOBACTER CALCOACETICUS/BAUMANNII COMPLEX SUSCEPTIBILITIES PERFORMED ON PREVIOUS CULTURE WITHIN THE LAST 5 DAYS. Performed at [facility], [place] [address], [place], [place] [address]    Report Status 03/02/2019 FINAL  Final  Blood culture (routine x 2)     Status: Abnormal   Collection Time: 02/27/19  4:00 PM   Specimen: BLOOD RIGHT HAND  Result Value Ref Range Status   Specimen Description  Final    BLOOD RIGHT HAND Performed at [facility], [address] [place], [place] [address]    Special Requests   Final    BOTTLES DRAWN AEROBIC AND ANAEROBIC Blood Culture adequate volume Performed at [facility], [address] [place], [place] [address]    Culture  Setup Time   Final    GRAM NEGATIVE RODS AEROBIC BOTTLE ONLY GRAM POSITIVE COCCI CRITICAL RESULT CALLED TO, READ BACK BY AND VERIFIED WITH: PHARMD A LOVE [id number] AT 36 BY CM    Culture (A)  Final    ACINETOBACTER CALCOACETICUS/BAUMANNII COMPLEX STAPHYLOCOCCUS SPECIES (COAGULASE NEGATIVE) THE SIGNIFICANCE OF ISOLATING THIS ORGANISM FROM A SINGLE SET OF BLOOD CULTURES WHEN MULTIPLE SETS ARE DRAWN IS UNCERTAIN. PLEASE NOTIFY THE MICROBIOLOGY DEPARTMENT WITHIN ONE WEEK IF SPECIATION AND SENSITIVITIES ARE REQUIRED. Performed at [facility], [place] [address], [place], [place] [address]    Report Status 03/02/2019 FINAL  Final   Organism ID, Bacteria ACINETOBACTER CALCOACETICUS/BAUMANNII COMPLEX  Final      Susceptibility   Acinetobacter calcoaceticus/baumannii complex - MIC*    CEFTAZIDIME 8 SENSITIVE Sensitive     CEFTRIAXONE 16 INTERMEDIATE Intermediate     CIPROFLOXACIN <=0.25 SENSITIVE Sensitive     GENTAMICIN <=1 SENSITIVE Sensitive     IMIPENEM <=0.25 SENSITIVE Sensitive     PIP/TAZO 8 SENSITIVE Sensitive     TRIMETH/SULFA <=20 SENSITIVE  Sensitive     CEFEPIME 8 SENSITIVE Sensitive     AMPICILLIN/SULBACTAM <=2 SENSITIVE Sensitive     * ACINETOBACTER CALCOACETICUS/BAUMANNII COMPLEX  Blood Culture ID Panel (Reflexed)     Status: Abnormal   Collection Time: 02/27/19  4:00 PM  Result Value Ref Range Status   Enterococcus species NOT DETECTED NOT DETECTED Final   Listeria monocytogenes NOT DETECTED NOT DETECTED Final   Staphylococcus species DETECTED (A) NOT DETECTED Final    Comment: Methicillin (oxacillin) susceptible coagulase negative staphylococcus. Possible blood culture contaminant (unless isolated from more than one blood culture draw or clinical case suggests pathogenicity). No antibiotic treatment is indicated for blood  culture contaminants. CRITICAL RESULT CALLED TO, READ BACK BY AND VERIFIED WITH: PHARMD A LOVE [id number] AT 6301 BY CM    Staphylococcus aureus (BCID) NOT DETECTED NOT DETECTED Final   Methicillin resistance NOT DETECTED NOT DETECTED Final   Streptococcus species NOT DETECTED NOT DETECTED Final   Streptococcus agalactiae NOT DETECTED NOT DETECTED Final   Streptococcus pneumoniae NOT DETECTED NOT DETECTED Final   Streptococcus pyogenes NOT DETECTED NOT DETECTED Final   Acinetobacter baumannii NOT DETECTED NOT DETECTED Final   Enterobacteriaceae species NOT DETECTED NOT DETECTED Final   Enterobacter cloacae complex NOT DETECTED NOT DETECTED Final   Escherichia coli NOT DETECTED NOT DETECTED Final   Klebsiella oxytoca NOT DETECTED NOT DETECTED Final   Klebsiella pneumoniae NOT DETECTED NOT DETECTED Final   Proteus species NOT DETECTED NOT DETECTED Final   Serratia marcescens NOT DETECTED NOT DETECTED Final   Haemophilus influenzae NOT DETECTED NOT DETECTED Final   Neisseria meningitidis NOT DETECTED NOT DETECTED Final   Pseudomonas aeruginosa NOT DETECTED NOT DETECTED Final   Candida albicans NOT DETECTED NOT DETECTED Final   Candida glabrata NOT DETECTED NOT DETECTED Final   Candida krusei NOT  DETECTED NOT DETECTED Final   Candida parapsilosis NOT DETECTED NOT DETECTED Final   Candida tropicalis NOT DETECTED NOT DETECTED Final    Comment: Performed at [facility], [address] [place], [place] [address]  SARS Coronavirus 2 (Hosp order,Performed in [facility] lab via [employer])     Status: None   Collection Time: 02/27/19  4:15 PM   Specimen: Dry Nasal Swab ([employer] Now)  Result Value Ref Range Status   SARS Coronavirus 2 ([employer] Now) NEGATIVE NEGATIVE Final    Comment: (NOTE) Interpretive Result Comment(s): COVID 19 Positive SARS CoV 2 target nucleic acids are DETECTED. The SARS CoV 2 RNA is generally detectable in upper and lower respiratory specimens during the acute phase of infection.  Positive results are indicative of active infection with SARS CoV 2.  Clinical correlation with patient history and other diagnostic information is necessary to determine patient infection status.  Positive results do not rule out bacterial infection or coinfection with other viruses. The expected result is Negative. COVID 19 Negative SARS CoV 2 target nucleic acids are NOT DETECTED. The SARS CoV 2 RNA is generally detectable in upper and lower respiratory specimens during the acute phase of infection.  Negative results do not preclude SARS CoV 2 infection, do not rule out coinfections with other pathogens, and should not be used as the sole basis for treatment or other patient management decisions.  Negative results must be combined with clinical  observations, patient history, and epidemiological information. The expected result is Negative. Invalid Presence or absence of SARS CoV 2 nucleic acids cannot be determined. Repeat testing was performed on the submitted specimen and repeated Invalid results were obtained.  If clinically indicated, additional testing on a new specimen with an alternate test methodology [phone]) is advised.  The SARS CoV 2 RNA is generally  detectable in upper and lower respiratory specimens during the acute phase of infection. The expected result is Negative. Fact Sheet for Patients:  GolfingFamily.no Fact Sheet for Healthcare Providers: https://www.hernandez-brewer.com/ This test is not yet approved or cleared by the [place] FDA and has been authorized for detection and/or diagnosis of SARS CoV 2 by FDA under an Emergency Use Authorization (EUA).  This EUA will remain in effect (meaning this test can be used) for the duration of the COVID19 d eclaration under Section 564(b)(1) of the Act, 21 U.S.C. section 747 637 8722 3(b)(1), unless the authorization is terminated or revoked sooner. Performed at [facility], [address] [place], [place] [address]   Urine culture     Status: None   Collection Time: 02/27/19  5:25 PM   Specimen: Urine, Random  Result Value Ref Range Status   Specimen Description   Final    URINE, RANDOM Performed at [facility] [facility], [address] [place], [place] [address]    Special Requests   Final    NONE Performed at [facility], [address] [place], [place] [address]    Culture   Final    NO GROWTH Performed at [facility], [place] [address] [place], [place] [address]    Report Status 02/28/2019 FINAL  Final  MRSA PCR Screening     Status: None   Collection Time: 02/28/19 12:32 AM   Specimen: Nasal Mucosa; Nasopharyngeal  Result Value Ref Range Status   MRSA by PCR NEGATIVE NEGATIVE Final    Comment:        The GeneXpert MRSA Assay (FDA approved for NASAL specimens only), is one component of a comprehensive MRSA colonization surveillance program. It is not intended to diagnose MRSA infection nor to guide or monitor treatment for MRSA infections. Performed at [facility], [place] [address] [place], [place] [address]   Novel Coronavirus, NAA (hospital order; send-out to ref lab)     Status: None    Collection Time: 02/28/19  1:10 AM  Result Value Ref Range Status   SARS-CoV-2, NAA NOT DETECTED NOT DETECTED Final    Comment: (NOTE) This test was developed and its performance characteristics determined by [employer]. This test has not been FDA cleared or approved. This test has been authorized by FDA under an Emergency Use Authorization (EUA). This test is only authorized for the duration of time the declaration that circumstances exist justifying the authorization of the emergency use of in vitro diagnostic tests for detection of SARS-CoV-2 virus and/or diagnosis of COVID-19 infection under section 564(b)(1) of the Act, 21 U.S.C. 433IRJ-1(O)(8), unless the authorization is terminated or revoked sooner. When diagnostic testing is negative, the possibility of a false negative result should be considered in the context of a patient's recent exposures and the presence of clinical signs and symptoms consistent with COVID-19. An individual without symptoms of COVID-19 and who is not shedding SARS-CoV-2 virus would expect to have a negative (not detected) result in this assay. Performed  At: [facility] [address] [place], [place] [id number] Rush Farmer MD SW:[id number]    [place]  Final    Comment: Performed at [facility], [place] [address], [place], [place] [address]  Culture, blood (routine x 2)     Status: None (Preliminary result)   Collection Time: 03/04/19 10:35 AM   Specimen: BLOOD RIGHT HAND  Result Value Ref Range Status   Specimen Description BLOOD RIGHT HAND  Final   Special Requests   Final    BOTTLES DRAWN AEROBIC ONLY Blood Culture adequate volume   Culture   Final    NO GROWTH 4 DAYS Performed at [facility], [place] [address], [place], [place] [address]    Report Status PENDING  Incomplete  Culture, blood (routine x 2)     Status: None (Preliminary result)   Collection Time: 03/04/19 10:45 AM    Specimen: BLOOD RIGHT HAND  Result Value Ref Range Status   Specimen Description BLOOD RIGHT HAND  Final   Special Requests   Final    BOTTLES DRAWN AEROBIC ONLY Blood Culture adequate volume   Culture   Final    NO GROWTH 4 DAYS Performed at [facility], [place] [address], [place], [place] [address]    Report Status PENDING  Incomplete     Labs: BNP (last 3 results) Recent Labs    02/27/19 1534  BNP 831.5*   Basic Metabolic Panel: Recent Labs  Lab 03/04/19 0420 03/05/19 0353 03/06/19 0312 03/07/19 0404 03/08/19 0504  NA 141 140 140 139 139  K 3.2* 3.0* 3.3* 3.4* 3.7  CL 109 107 108 109 109  CO2 23 22 23  21* 22  GLUCOSE 206* 171* 194* 192* 158*  BUN 53* 38* 27* 23 19  CREATININE 1.71* 1.29* 1.10 1.11 1.02  CALCIUM 8.2* 8.3* 8.5* 8.6* 8.7*  MG 2.1 1.8 1.6* 1.3* 1.4*  PHOS 2.9 2.2* 1.8* 1.7* 2.3*   Liver Function Tests: Recent Labs  Lab 03/04/19 0420 03/05/19 0353 03/06/19 0312 03/07/19 0404 03/08/19 0504  ALBUMIN 2.3* 2.3* 2.2* 2.2* 2.0*   No results for input(s): LIPASE, AMYLASE in the last 168 hours. No results for input(s): AMMONIA in the last 168 hours. CBC: Recent Labs  Lab 03/02/19 0500  03/03/19 0328 03/04/19  3154 03/05/19 0353 03/06/19 0312 03/07/19 0404 03/08/19 0504  WBC 12.9*  --  12.8* 11.1* 10.2 11.2* 12.5* 12.4*  NEUTROABS 5.3  --  11.7* 9.4* 8.3* 9.1*  --   --   HGB 10.1*   < > 10.6* 11.4* 10.6* 10.2* 10.0* 9.9*  HCT 31.0*   < > 33.4* 35.8* 32.7* 31.6* 31.2* 30.6*  MCV 84.0  --  85.6 84.8 85.2 84.7 85.2 86.0  PLT 70*  --  67* 81* 85* 89* 87* 106*   < > = values in this interval not displayed.   Cardiac Enzymes: Recent Labs  Lab 03/03/19 0328  TROPONINI 0.23*   BNP: Invalid input(s): POCBNP CBG: Recent Labs  Lab 03/07/19 2037 03/08/19 0045 03/08/19 0457 03/08/19 0806 03/08/19 1233  GLUCAP 252* 316* 154* 92 211*   D-Dimer No results for input(s): DDIMER in the last 72 hours. Hgb A1c No results for input(s):  HGBA1C in the last 72 hours. Lipid Profile No results for input(s): CHOL, HDL, LDLCALC, TRIG, CHOLHDL, LDLDIRECT in the last 72 hours. Thyroid function studies No results for input(s): TSH, T4TOTAL, T3FREE, THYROIDAB in the last 72 hours.  Invalid input(s): FREET3 Anemia work up No results for input(s): VITAMINB12, FOLATE, FERRITIN, TIBC, IRON, RETICCTPCT in the last 72 hours. Urinalysis    Component Value Date/Time   COLORURINE YELLOW 02/27/2019 1725   APPEARANCEUR CLEAR 02/27/2019 1725   LABSPEC 1.025 02/27/2019 1725   PHURINE 5.5 02/27/2019 [place] 02/27/2019 1725   HGBUR NEGATIVE 02/27/2019 1725   BILIRUBINUR NEGATIVE 02/27/2019 1725   KETONESUR 15 (A) 02/27/2019 1725   PROTEINUR NEGATIVE 02/27/2019 1725   NITRITE NEGATIVE 02/27/2019 1725   LEUKOCYTESUR NEGATIVE 02/27/2019 1725   Sepsis Labs Invalid input(s): PROCALCITONIN,  WBC,  LACTICIDVEN Microbiology Recent Results (from the past 240 hour(s))  Blood culture (routine x 2)     Status: Abnormal   Collection Time: 02/27/19  3:35 PM   Specimen: BLOOD  Result Value Ref Range Status   Specimen Description   Final    BLOOD RIGHT ANTECUBITAL Performed at [facility], [place] [address] [place], [place] [address]    Special Requests   Final    BOTTLES DRAWN AEROBIC AND ANAEROBIC Blood Culture adequate volume Performed at [facility], [address] [place], [place] [address]    Culture  Setup Time   Final    GRAM NEGATIVE RODS AEROBIC BOTTLE ONLY CRITICAL VALUE NOTED.  VALUE IS CONSISTENT WITH PREVIOUSLY REPORTED AND CALLED VALUE.    Culture (A)  Final    ACINETOBACTER CALCOACETICUS/BAUMANNII COMPLEX SUSCEPTIBILITIES PERFORMED ON PREVIOUS CULTURE WITHIN THE LAST 5 DAYS. Performed at [facility], [place] [address] [place],  [address]    Report Status 03/02/2019 FINAL  Final  Blood culture (routine x 2)     Status: Abnormal   Collection Time: 02/27/19  4:00 PM   Specimen:  BLOOD RIGHT HAND  Result Value Ref Range Status   Specimen Description   Final    BLOOD RIGHT HAND Performed at [facility], [address] [place], [place] [address]    Special Requests   Final    BOTTLES DRAWN AEROBIC AND ANAEROBIC Blood Culture adequate volume Performed at [facility], [address] [place], [place] [address]    Culture  Setup Time   Final    GRAM NEGATIVE RODS AEROBIC BOTTLE ONLY GRAM POSITIVE COCCI CRITICAL RESULT CALLED TO,  READ BACK BY AND VERIFIED WITH: PHARMD A LOVE [id number] AT 80 BY CM    Culture (A)  Final    ACINETOBACTER CALCOACETICUS/BAUMANNII COMPLEX STAPHYLOCOCCUS SPECIES (COAGULASE NEGATIVE) THE SIGNIFICANCE OF ISOLATING THIS ORGANISM FROM A SINGLE SET OF BLOOD CULTURES WHEN MULTIPLE SETS ARE DRAWN IS UNCERTAIN. PLEASE NOTIFY THE MICROBIOLOGY DEPARTMENT WITHIN ONE WEEK IF SPECIATION AND SENSITIVITIES ARE REQUIRED. Performed at [facility], [place] [address], [place], [place] [address]    Report Status 03/02/2019 FINAL  Final   Organism ID, Bacteria ACINETOBACTER CALCOACETICUS/BAUMANNII COMPLEX  Final      Susceptibility   Acinetobacter calcoaceticus/baumannii complex - MIC*    CEFTAZIDIME 8 SENSITIVE Sensitive     CEFTRIAXONE 16 INTERMEDIATE Intermediate     CIPROFLOXACIN <=0.25 SENSITIVE Sensitive     GENTAMICIN <=1 SENSITIVE Sensitive     IMIPENEM <=0.25 SENSITIVE Sensitive     PIP/TAZO 8 SENSITIVE Sensitive     TRIMETH/SULFA <=20 SENSITIVE Sensitive     CEFEPIME 8 SENSITIVE Sensitive     AMPICILLIN/SULBACTAM <=2 SENSITIVE Sensitive     * ACINETOBACTER CALCOACETICUS/BAUMANNII COMPLEX  Blood Culture ID Panel (Reflexed)     Status: Abnormal   Collection Time: 02/27/19  4:00 PM  Result Value Ref Range Status   Enterococcus species NOT DETECTED NOT DETECTED Final   Listeria monocytogenes NOT DETECTED NOT DETECTED Final   Staphylococcus species DETECTED (A) NOT DETECTED Final    Comment: Methicillin (oxacillin)  susceptible coagulase negative staphylococcus. Possible blood culture contaminant (unless isolated from more than one blood culture draw or clinical case suggests pathogenicity). No antibiotic treatment is indicated for blood  culture contaminants. CRITICAL RESULT CALLED TO, READ BACK BY AND VERIFIED WITH: PHARMD A LOVE [id number] AT 2703 BY CM    Staphylococcus aureus (BCID) NOT DETECTED NOT DETECTED Final   Methicillin resistance NOT DETECTED NOT DETECTED Final   Streptococcus species NOT DETECTED NOT DETECTED Final   Streptococcus agalactiae NOT DETECTED NOT DETECTED Final   Streptococcus pneumoniae NOT DETECTED NOT DETECTED Final   Streptococcus pyogenes NOT DETECTED NOT DETECTED Final   Acinetobacter baumannii NOT DETECTED NOT DETECTED Final   Enterobacteriaceae species NOT DETECTED NOT DETECTED Final   Enterobacter cloacae complex NOT DETECTED NOT DETECTED Final   Escherichia coli NOT DETECTED NOT DETECTED Final   Klebsiella oxytoca NOT DETECTED NOT DETECTED Final   Klebsiella pneumoniae NOT DETECTED NOT DETECTED Final   Proteus species NOT DETECTED NOT DETECTED Final   Serratia marcescens NOT DETECTED NOT DETECTED Final   Haemophilus influenzae NOT DETECTED NOT DETECTED Final   Neisseria meningitidis NOT DETECTED NOT DETECTED Final   Pseudomonas aeruginosa NOT DETECTED NOT DETECTED Final   Candida albicans NOT DETECTED NOT DETECTED Final   Candida glabrata NOT DETECTED NOT DETECTED Final   Candida krusei NOT DETECTED NOT DETECTED Final   Candida parapsilosis NOT DETECTED NOT DETECTED Final   Candida tropicalis NOT DETECTED NOT DETECTED Final    Comment: Performed at [facility], [address] [place],  [address]  SARS Coronavirus 2 (Hosp order,Performed in [facility] lab via [employer] ID)     Status: None   Collection Time: 02/27/19  4:15 PM   Specimen: Dry Nasal Swab ([employer] ID Now)  Result Value Ref Range Status   SARS Coronavirus 2 ([employer] ID Now) NEGATIVE NEGATIVE  Final    Comment: (NOTE) Interpretive Result Comment(s): COVID 19 Positive SARS CoV 2 target nucleic acids are DETECTED. The SARS CoV 2 RNA is generally detectable in upper  and lower respiratory specimens during the acute phase of infection.  Positive results are indicative of active infection with SARS CoV 2.  Clinical correlation with patient history and other diagnostic information is necessary to determine patient infection status.  Positive results do not rule out bacterial infection or coinfection with other viruses. The expected result is Negative. COVID 19 Negative SARS CoV 2 target nucleic acids are NOT DETECTED. The SARS CoV 2 RNA is generally detectable in upper and lower respiratory specimens during the acute phase of infection.  Negative results do not preclude SARS CoV 2 infection, do not rule out coinfections with other pathogens, and should not be used as the sole basis for treatment or other patient management decisions.  Negative results must be combined with clinical  observations, patient history, and epidemiological information. The expected result is Negative. Invalid Presence or absence of SARS CoV 2 nucleic acids cannot be determined. Repeat testing was performed on the submitted specimen and repeated Invalid results were obtained.  If clinically indicated, additional testing on a new specimen with an alternate test methodology [phone]) is advised.  The SARS CoV 2 RNA is generally detectable in upper and lower respiratory specimens during the acute phase of infection. The expected result is Negative. Fact Sheet for Patients:  GolfingFamily.no Fact Sheet for Healthcare Providers: https://www.hernandez-brewer.com/ This test is not yet approved or cleared by the [place] FDA and has been authorized for detection and/or diagnosis of SARS CoV 2 by FDA under an Emergency Use Authorization (EUA).  This EUA will remain in  effect (meaning this test can be used) for the duration of the COVID19 d eclaration under Section 564(b)(1) of the Act, 21 U.S.C. section [phone] 3(b)(1), unless the authorization is terminated or revoked sooner. Performed at [facility], [address] [place], [place] [address]   Urine culture     Status: None   Collection Time: 02/27/19  5:25 PM   Specimen: Urine, Random  Result Value Ref Range Status   Specimen Description   Final    URINE, RANDOM Performed at [facility], [address] [place], [place] [address]    Special Requests   Final    NONE Performed at [facility], [address] [place], [place] [address]    Culture   Final    NO GROWTH Performed at [facility], [place] [address], [place], [place] [address]    Report Status 02/28/2019 FINAL  Final  MRSA PCR Screening     Status: None   Collection Time: 02/28/19 12:32 AM   Specimen: Nasal Mucosa; Nasopharyngeal  Result Value Ref Range Status   MRSA by PCR NEGATIVE NEGATIVE Final    Comment:        The GeneXpert MRSA Assay (FDA approved for NASAL specimens only), is one component of a comprehensive MRSA colonization surveillance program. It is not intended to diagnose MRSA infection nor to guide or monitor treatment for MRSA infections. Performed at [facility], [place] [address] [place], [place] [address]   Novel Coronavirus, NAA (hospital order; send-out to ref lab)     Status: None   Collection Time: 02/28/19  1:10 AM  Result Value Ref Range Status   SARS-CoV-2, NAA NOT DETECTED NOT DETECTED Final    Comment: (NOTE) This test was developed and its performance characteristics determined by [employer]. This test has not been FDA cleared or approved. This test has been authorized by  FDA under an Emergency Use Authorization (EUA). This test is only authorized for the duration of time the declaration that circumstances exist justifying the  authorization of the emergency use of in vitro diagnostic tests for detection of SARS-CoV-2 virus and/or diagnosis of COVID-19 infection under section 564(b)(1) of the Act, 21 U.S.C. 127NTZ-0(Y)(1), unless the authorization is terminated or revoked sooner. When diagnostic testing is negative, the possibility of a false negative result should be considered in the context of a patient's recent exposures and the presence of clinical signs and symptoms consistent with COVID-19. An individual without symptoms of COVID-19 and who is not shedding SARS-CoV-2 virus would expect to have a negative (not detected) result in this assay. Performed  At: [facility] [address] [place], [place] [id number] Rush Farmer MD FF:[id number]    [place]  Final    Comment: Performed at [facility], [place] [address] [place], [place] [address]  Culture, blood (routine x 2)     Status: None (Preliminary result)   Collection Time: 03/04/19 10:35 AM   Specimen: BLOOD RIGHT HAND  Result Value Ref Range Status   Specimen Description BLOOD RIGHT HAND  Final   Special Requests   Final    BOTTLES DRAWN AEROBIC ONLY Blood Culture adequate volume   Culture   Final    NO GROWTH 4 DAYS Performed at [facility], [place] [address] [place], [place] [address]    Report Status PENDING  Incomplete  Culture, blood (routine x 2)     Status: None (Preliminary result)   Collection Time: 03/04/19 10:45 AM   Specimen: BLOOD RIGHT HAND  Result Value Ref Range Status   Specimen Description BLOOD RIGHT HAND  Final   Special Requests   Final    BOTTLES DRAWN AEROBIC ONLY Blood Culture adequate volume   Culture   Final    NO GROWTH 4 DAYS Performed at [facility], [place] [address] [place], [place] [address]    Report Status PENDING  Incomplete     Time coordinating discharge: 35 minutes  SIGNED:   Jonnie Finner, DO  [facility] 03/08/2019, 12:58 PM Pager    If 7PM-7AM, please contact night-coverage [email or web address] Password [email or web address]

## 2019-03-08 NOTE — Progress Notes (Signed)
Patient had Flu vaccine at the same time as PNA vaccine , which was in Feb. or March of this year.

## 2019-03-08 NOTE — Progress Notes (Signed)
Patient arrived from 5W via wheelchair with RN and patient belongings. Patient oriented to room, nurse call system, rehab schedule, rehab safety plan, fall prevention plan, and health resource notebook with verbal understanding. Patient resting comfortably in bed with bed alarm on and call bell at side.

## 2019-03-08 NOTE — Progress Notes (Signed)
Patient transferred to [place] from 5W. Report called to [facility]. Patient A&Ox4 at time of transfer, denies pain or discomfort.

## 2019-03-08 NOTE — Progress Notes (Signed)
Inpatient Rehabilitation-Admissions Coordinator   [facility] has received both insurance approval and medical approval for admit to CIR today. Pt is aware and in agreement. AC will update RN, CM/SW regarding plan.   Please call if questions.   Jhonnie Garner, OTR/L  Rehab Admissions Coordinator  [phone] 03/08/2019 1:45 PM

## 2019-03-09 ENCOUNTER — Inpatient Hospital Stay (HOSPITAL_COMMUNITY): Payer: Medicare HMO | Admitting: Occupational Therapy

## 2019-03-09 ENCOUNTER — Inpatient Hospital Stay (HOSPITAL_COMMUNITY): Payer: Medicare HMO | Admitting: Physical Therapy

## 2019-03-09 DIAGNOSIS — J189 Pneumonia, unspecified organism: Secondary | ICD-10-CM

## 2019-03-09 LAB — GLUCOSE, CAPILLARY
Glucose-Capillary: 110 mg/dL — ABNORMAL HIGH (ref 70–99)
Glucose-Capillary: 132 mg/dL — ABNORMAL HIGH (ref 70–99)
Glucose-Capillary: 162 mg/dL — ABNORMAL HIGH (ref 70–99)
Glucose-Capillary: 200 mg/dL — ABNORMAL HIGH (ref 70–99)
Glucose-Capillary: 260 mg/dL — ABNORMAL HIGH (ref 70–99)
Glucose-Capillary: 271 mg/dL — ABNORMAL HIGH (ref 70–99)

## 2019-03-09 LAB — RENAL FUNCTION PANEL
Albumin: 2.5 g/dL — ABNORMAL LOW (ref 3.5–5.0)
Anion gap: 11 (ref 5–15)
BUN: 19 mg/dL (ref 8–23)
CO2: 21 mmol/L — ABNORMAL LOW (ref 22–32)
Calcium: 9.6 mg/dL (ref 8.9–10.3)
Chloride: 106 mmol/L (ref 98–111)
Creatinine, Ser: 1.01 mg/dL (ref 0.61–1.24)
GFR calc Af Amer: >60 mL/min (ref >60)
GFR calc non Af Amer: >60 mL/min (ref >60)
Glucose, Bld: 153 mg/dL — ABNORMAL HIGH (ref 70–99)
Phosphorus: 2.1 mg/dL — ABNORMAL LOW (ref 2.5–4.6)
Potassium: 3.7 mmol/L (ref 3.5–5.1)
Sodium: 138 mmol/L (ref 135–145)

## 2019-03-09 LAB — CULTURE, BLOOD (ROUTINE X 2)
Culture: NO GROWTH
Culture: NO GROWTH
Special Requests: ADEQUATE
Special Requests: ADEQUATE

## 2019-03-09 LAB — PROTIME-INR
INR: 2.4 — ABNORMAL HIGH (ref 0.8–1.2)
Prothrombin Time: 25.8 seconds — ABNORMAL HIGH (ref 11.4–15.2)

## 2019-03-09 LAB — CBC WITH DIFFERENTIAL/PLATELET
Abs Immature Granulocytes: 0.19 10*3/uL — ABNORMAL HIGH (ref 0.00–0.07)
Basophils Absolute: 0 10*3/uL (ref 0.0–0.1)
Basophils Relative: 0 %
Eosinophils Absolute: 0 10*3/uL (ref 0.0–0.5)
Eosinophils Relative: 0 %
HCT: 38.4 % — ABNORMAL LOW (ref 39.0–52.0)
Hemoglobin: 11.9 g/dL — ABNORMAL LOW (ref 13.0–17.0)
Immature Granulocytes: 2 %
Lymphocytes Relative: 2 %
Lymphs Abs: 0.2 10*3/uL — ABNORMAL LOW (ref 0.7–4.0)
MCH: 27.2 pg (ref 26.0–34.0)
MCHC: 31 g/dL (ref 30.0–36.0)
MCV: 87.7 fL (ref 80.0–100.0)
Monocytes Absolute: 0.2 10*3/uL (ref 0.1–1.0)
Monocytes Relative: 2 %
Neutro Abs: 9.4 10*3/uL — ABNORMAL HIGH (ref 1.7–7.7)
Neutrophils Relative %: 94 %
Platelets: 164 10*3/uL (ref 150–400)
RBC: 4.38 MIL/uL (ref 4.22–5.81)
RDW: 15.9 % — ABNORMAL HIGH (ref 11.5–15.5)
WBC: 10 10*3/uL (ref 4.0–10.5)
nRBC: 0 % (ref 0.0–0.2)

## 2019-03-09 MED ORDER — WARFARIN SODIUM 2.5 MG PO TABS
2.5000 mg | ORAL_TABLET | Freq: Once | ORAL | Status: AC
  Administered 2019-03-09: 16:00:00 2.5 mg via ORAL
  Filled 2019-03-09: qty 1

## 2019-03-09 NOTE — Evaluation (Signed)
Physical Therapy Assessment and Plan  Patient Details  Name: Ryan Rice MRN: [id number] Date of Birth: 1951/09/07  PT Diagnosis: Abnormal posture, Abnormality of gait, Difficulty walking, Edema, Impaired sensation, Muscle weakness and Pain in LEs Rehab Potential: Good ELOS: 9-12 days   Today's Date: 03/09/2019 PT Individual Time: 2831-5176 PT Individual Time Calculation (min): 73 min    Problem List:  Patient Active Problem List   Diagnosis Date Noted  . Debility 03/08/2019  . Acute renal failure (ARF) ([place])   . Community acquired pneumonia of left lower lobe of lung ([place])   . Kidney transplant recipient   . Sepsis ([place]) 02/27/2019    Past Medical History:  Past Medical History:  Diagnosis Date  . Allergic rhinitis   . BPH (benign prostatic hyperplasia)   . Chronic atrial fibrillation   . Diabetes mellitus ([place])   . DVT (deep venous thrombosis) ([facility])    LLE  . Hematochezia   . HTN (hypertension)   . Kidney transplant recipient   . PVD (peripheral vascular disease) ([place])    Past Surgical History:  Past Surgical History:  Procedure Laterality Date  . KIDNEY TRANSPLANT      Assessment & Plan Clinical Impression: Patient is a 68 y.o. year old male with with history of ESRD s/p renal transplant, CAF, T2DM,who was admitted on 02/27/2019 with diarrhea and abdominal pain. He was noted to be hypoxic with increased WOB due to extensive left greater than right PNA with sepsis. He was placed on BiPAP and started on broad-spectrum antibiotics however developed hypotension with worsening of respiratory status. He was treated with pressors, stress dose steroids and was intubated on 6/04. Nephrology consulted for input on acute on chronic renal failure with metabolic acidosis and he was treated with bicarb. He tolerated extubation by 06/06 and urine output/renal status is improving. Elevated troponins felt to be due to demand ischemia patient cardiology follow-up. Abdominal  ultrasound showed increased attenuation around the left prostate as well as mass arising from --he will need urology follow-up afterdischarge. His blood cultures were positive for AcinetobacterCalcoacetius and coag negative staphand antibiotics narrowed to Unasyn recommendation to complete 10 days total of antibiotic course.Myfortic resumed 06/09.He developed pain bilateral feet felt to be due to gout flare and prednisone increased to 40 mg for treatmenton 6/10. Hypokalemia hypomagnesemia have resolved with supplementation. Therapy has been ongoing revealing debility. CIR recommended due to functional deficits. Patient transferred to CIR on 03/08/2019 .   Patient currently requires min with mobility secondary to muscle weakness, decreased cardiorespiratoy endurance, impaired timing and sequencing and unbalanced muscle activation and decreased standing balance, decreased postural control and decreased balance strategies.  Prior to hospitalization, patient was independent  with mobility and lived with Spouse, Family(wife and granddaughter (17y.o.)) in a House home.  Home access is 5Stairs to enter.  Patient will benefit from skilled PT intervention to maximize safe functional mobility, minimize fall risk and decrease caregiver burden for planned discharge home with 24 hour supervision.  Anticipate patient will benefit from follow up [place] at discharge.  PT - End of Session Activity Tolerance: Tolerates 30+ min activity with multiple rests Endurance Deficit: Yes Endurance Deficit Description: required frequent rest breaks during session PT Assessment Rehab Potential (ACUTE/IP ONLY): Good PT Patient demonstrates impairments in the following area(s): Balance;Safety;Sensory;Motor;Skin Integrity;Pain;Endurance;Edema PT Transfers Functional Problem(s): Bed Mobility;Bed to Chair;Car;Furniture PT Locomotion Functional Problem(s): Ambulation;Stairs PT Plan PT Intensity: Minimum of 1-2 x/day ,45 to 90  minutes PT Frequency: 5 out of 7 days PT  Duration Estimated Length of Stay: 9-12 days PT Treatment/Interventions: Ambulation/gait training;Community reintegration;DME/adaptive equipment instruction;Neuromuscular re-education;Psychosocial support;Stair training;UE/LE Strength taining/ROM;Balance/vestibular training;Discharge planning;Functional electrical stimulation;Pain management;Skin care/wound management;Therapeutic Activities;UE/LE Coordination activities;Disease management/prevention;Functional mobility training;Patient/family education;Therapeutic Exercise;Splinting/orthotics PT Transfers Anticipated Outcome(s): mod-I PT Locomotion Anticipated Outcome(s): supervision PT Recommendation Recommendations for Other Services: Therapeutic Recreation consult Therapeutic Recreation Interventions: Kitchen group Follow Up Recommendations: 24 hour supervision/assistance;Home health PT Patient destination: Home Equipment Recommended: To be determined;Other (comment) Equipment Details: pt has none  Skilled Therapeutic Intervention Evaluation completed (see details above and below) with education on PT POC and goals and individual treatment initiated with focus on bed mobility, transfers, gait, stair navigation, and car transfer as well as education regarding daily therapy schedule, weekly team meetings, purpose of PT evaluation, and other CIR information. Pt received sitting in w/c and agreeable to therapy session.  Transported to/from gym in w/c. Sit<>stand with min assist w/c<>RW but required mod assist to stand from lower chair height or when standing from w/c to no AD due to increased unsteadiness. Ambulated 78f using RW with min assist for balance - demonstrated increased trunk flexion, decreased B LE foot clearance (L>R) and decreased B LE step length as well as decreased gait speed. Ambulated 697fwith L HHA and min assist from 1 person and close supervision from 2nd person for safety.  Ascended/descended 8 steps using bilateral handrails with step-to pattern and min/mod assist for balance and lifting/lowering with pt demonstrating increased reliance on B UE support - cuing for improved upright posture. Performed stand pivot car transfer, no AD, with CGA for steadying and visual demonstration/verbal explanation of proper sequencing for increased safety. Ambulated up/down ramp using RW with min assist for balance and cuing for AD management. Therapist provided w/c cushion for improved pressure relief and increased upright activity tolerance. Transported back to room in w/c. Performed sit>supine, no bed features, with supervision then pt reporting needing to use bathroom. Supine>sit, no bed features, with supervision. Sit<>stand with CGA. Ambulated ~1551f2 to/from bathroom using RW with CGA/min assist for balance. Performed LB clothing management with min assist for balance while standing with RW. Continent of bladder and BM. Performed peri-care with supervision for sitting balance safety. Standing hand hygiene at sink using RW with CGA and education on proper AD safety at counters. Pt left supine in bed with needs in reach and bed alarm on.   PT Evaluation Precautions/Restrictions Precautions Precautions: Fall Restrictions Weight Bearing Restrictions: No Pain Pain Assessment Pain Scale: 0-10 Pain Score: 0-No pain Pain Intervention(s): Other (Comment)(therapy to tolerance) Home Living/Prior Functioning Home Living Available Help at Discharge: Available 24 hours/day;Family(wife available 24hr and can provide physical assist) Type of Home: House Home Access: Stairs to enter;Other (comment) Entrance Stairs-Number of Steps: 1STE back door and able ot use RW (main entryway) and 5STE without handrails on front enttrance Entrance Stairs-Rails: None Home Layout: One level Bathroom Shower/Tub: Walk-in shower;Tub/shower unit Bathroom Toilet: Standard Additional Comments: no DME  Lives  With: Spouse;Family(wife and granddaughter (17y.o.)) Prior Function Level of Independence: Independent with basic ADLs;Independent with homemaking with ambulation(reports he was mowing his yard day prior to hospitalization)  Able to Take Stairs?: Reciprically Driving: Yes Vocation: Retired VocPublic house managerquirements: 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, 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 Leisure: Hobbies-yes (Comment) Comments: enjoys fishing Vision/Perception  Perception Perception: Within Functional Limits Praxis Praxis: Intact  Cognition Overall Cognitive Status: Within Functional Limits for tasks assessed Arousal/Alertness: Awake/alert Orientation Level: Oriented X4 Attention: Focused;Sustained Focused Attention: Appears intact Sustained Attention: Appears intact  Memory: Appears intact Awareness: Appears intact Safety/Judgment: Appears intact Sensation Sensation Light Touch: Impaired Detail Peripheral sensation comments: impaired peripheral sensation in B feet (L>R) Light Touch Impaired Details: Impaired RLE;Impaired LLE Hot/Cold: Not tested Proprioception: Impaired by gross assessment(decreased awareness of LE placement during standing/ambulatory tasks) Stereognosis: Not tested Coordination Gross Motor Movements are Fluid and Coordinated: No Coordination and Movement Description: impaired due to generalized weakness/deconditioning Heel Shin Test: symmetrical and WNL Motor  Motor Motor: Other (comment) Motor - Skilled Clinical Observations: generalized weakness  Mobility Bed Mobility Bed Mobility: Sit to Supine;Supine to Sit Supine to Sit: Supervision/Verbal cueing Sit to Supine: Supervision/Verbal cueing Transfers Transfers: Sit to Stand;Stand to Sit;Stand Pivot Transfers;Squat Pivot Transfers Sit to Stand: Minimal Assistance - Patient > 75%;Moderate Assistance - Patient 50-74%(mod assist from lower height) Stand to Sit: Minimal Assistance - Patient > 75% Stand Pivot Transfers:  Minimal Assistance - Patient > 75% Stand Pivot Transfer Details: Verbal cues for safe use of DME/AE;Verbal cues for technique;Tactile cues for weight shifting;Tactile cues for sequencing;Verbal cues for precautions/safety;Verbal cues for sequencing Squat Pivot Transfers: Moderate Assistance - Patient 50-74%;Minimal Assistance - Patient > 75% Transfer (Assistive device): Rolling walker(RW with stand pivot and no AD with squat pivot) Locomotion  Gait Ambulation: Yes Gait Assistance: Minimal Assistance - Patient > 75% Gait Distance (Feet): 92 Feet Assistive device: Rolling walker Gait Assistance Details: Tactile cues for sequencing;Tactile cues for weight shifting;Verbal cues for technique;Verbal cues for safe use of DME/AE;Verbal cues for sequencing Gait Gait: Yes Gait Pattern: Impaired Gait Pattern: Decreased step length - left;Decreased step length - right;Decreased stride length;Decreased hip/knee flexion - right;Decreased hip/knee flexion - left;Poor foot clearance - right;Poor foot clearance - left;Trunk flexed Gait velocity: decreased Stairs / Additional Locomotion Stairs: Yes Stairs Assistance: Minimal Assistance - Patient > 75%;Moderate Assistance - Patient 50 - 74% Stair Management Technique: Two rails Number of Stairs: 8 Height of Stairs: 6 Ramp: Minimal Assistance - Patient >75% Wheelchair Mobility Wheelchair Mobility: No  Trunk/Postural Assessment  Cervical Assessment Cervical Assessment: Within Functional Limits Thoracic Assessment Thoracic Assessment: Exceptions to WFL(rounded shoulders) Lumbar Assessment Lumbar Assessment: Exceptions to WFL(posterior pelvic tilt) Postural Control Postural Control: Deficits on evaluation Righting Reactions: impaired Protective Responses: impaired Postural Limitations: decreased  Balance Balance Balance Assessed: Yes Static Sitting Balance Static Sitting - Level of Assistance: 5: Stand by assistance Dynamic Sitting  Balance Dynamic Sitting - Balance Support: Feet supported;During functional activity Dynamic Sitting - Level of Assistance: 5: Stand by assistance Static Standing Balance Static Standing - Balance Support: During functional activity;No upper extremity supported Static Standing - Level of Assistance: 4: Min assist Dynamic Standing Balance Dynamic Standing - Balance Support: During functional activity;Bilateral upper extremity supported Dynamic Standing - Level of Assistance: 4: Min assist;3: Mod assist Extremity Assessment  RLE Assessment RLE Assessment: Exceptions to [facility] RLE Strength Right Hip Flexion: 4/5 Right Knee Flexion: 4-/5 Right Knee Extension: 4/5 Right Ankle Dorsiflexion: 4-/5 Right Ankle Plantar Flexion: 4-/5 LLE Assessment LLE Assessment: Exceptions to [facility] LLE Strength Left Hip Flexion: 3+/5 Left Knee Flexion: 4-/5 Left Knee Extension: 4/5 Left Ankle Dorsiflexion: 3-/5(limited slightly by pain) Left Ankle Plantar Flexion: 3/5    Refer to Care Plan for Long Term Goals  Recommendations for other services: Therapeutic Recreation  Kitchen group and Stress management  Discharge Criteria: Patient will be discharged from PT if patient refuses treatment 3 consecutive times without medical reason, if treatment goals not met, if there is a change in medical status, if patient makes no progress towards goals  or if patient is discharged from hospital.  The above assessment, treatment plan, treatment alternatives and goals were discussed and mutually agreed upon: by patient  Tawana Scale, PT, DPT 03/09/2019, 9:21 AM

## 2019-03-09 NOTE — Progress Notes (Signed)
ANTICOAGULATION CONSULT NOTE - Follow Up Consult  Pharmacy Consult for Warfarin Indication: hx of DVT  Allergies  Allergen Reactions  . Iodinated Diagnostic Agents Hives  . Tape Hives    Patient Measurements: Height: 5\' 11"  (180.3 cm) Weight: 208 lb 8.9 oz (94.6 kg) IBW/kg (Calculated) : 75.3  Vital Signs: Temp: 97.8 F (36.6 C) (06/13 1430) Temp Source: Oral (06/13 1430) BP: 158/79 (06/13 1430) Pulse Rate: 98 (06/13 1430)  Labs: Recent Labs    03/07/19 0404 03/08/19 0504 03/09/19 1306  HGB 10.0* 9.9* 11.9*  HCT 31.2* 30.6* 38.4*  PLT 87* 106* 164  LABPROT 27.1* 27.3* 25.8*  INR 2.6* 2.6* 2.4*  CREATININE 1.11 1.02 1.01    Estimated Creatinine Clearance: 82.2 mL/min (by C-G formula based on SCr of 1.01 mg/dL).  Assessment: 68 yo M transfer from outside hospital with diarrhea/dyspnea. On warfarin PTA for history of DVT. Pt was briefly on heparin gtt on 6/7, now pharmacy consulted to manage warfarin therapy.    PTA warfarin regimen: 5mg  on Wed/Sun, 2.5mg  all other days. INR was 7.4 on admit.  INR today remains therapeutic at 2.4. Hgb improved to 11.9, Pltc improved to WNL. No bleeding reported.    Note possible drug-drug interaction with Bactrim, however Bactrim was also a PTA medication.  Goal of Therapy:  INR 2-3 Monitor platelets by anticoagulation protocol: Yes   Plan:  Warfarin 2.5mg  PO x 1 today Daily PT/INR Monitor closely for s/sx of bleeding   Lindell Spar, PharmD, BCPS Clinical Pharmacist [phone] 03/09/2019,2:31 PM

## 2019-03-09 NOTE — Progress Notes (Signed)
Drew Lips is a 68 y.o. male 01-Sep-1951 [id number]  Subjective: No new complaints. No new problems. Slept well. Feeling OK.  Objective: Vital signs in last 24 hours: Temp:  [98.5 F (36.9 C)-99 F (37.2 C)] 98.8 F (37.1 C) (06/13 0344) Pulse Rate:  [65-85] 65 (06/13 0344) Resp:  [16-19] 19 (06/13 0344) BP: (144-175)/(62-71) 175/71 (06/13 0344) SpO2:  [93 %-100 %] 99 % (06/13 0344) Weight:  [94.6 kg-94.8 kg] 94.6 kg (06/13 0531) Weight change:  Last BM Date: 03/08/19  Intake/Output from previous day: 06/12 0701 - 06/13 0700 In: 672 [P.O.:462; I.V.:10; IV Piggyback:200] Out: 1350 [Urine:1350] Last cbgs: CBG (last 3)  Recent Labs    03/09/19 0336 03/09/19 0808 03/09/19 1143  GLUCAP 132* 162* 110*     Physical Exam General: No apparent distress.  In a w/c  HEENT: not dry Lungs: Normal effort. Lungs clear to auscultation, no crackles or wheezes. Cardiovascular: Regular rate and rhythm, no edema Abdomen: S/NT/ND; BS(+) Musculoskeletal:  unchanged Neurological: No new neurological deficits Wounds: N/A    Skin: clear  Aging changes Mental state: Alert, cooperative    Lab Results: BMET    Component Value Date/Time   NA 139 03/08/2019 0504   K 3.7 03/08/2019 0504   CL 109 03/08/2019 0504   CO2 22 03/08/2019 0504   GLUCOSE 158 (H) 03/08/2019 0504   BUN 19 03/08/2019 0504   CREATININE 1.02 03/08/2019 0504   CALCIUM 8.7 (L) 03/08/2019 0504   GFRNONAA >60 03/08/2019 0504   GFRAA >60 03/08/2019 0504   CBC    Component Value Date/Time   WBC 12.4 (H) 03/08/2019 0504   RBC 3.56 (L) 03/08/2019 0504   HGB 9.9 (L) 03/08/2019 0504   HGB 7.8 (L) 02/28/2019 0331   HCT 30.6 (L) 03/08/2019 0504   PLT 106 (L) 03/08/2019 0504   MCV 86.0 03/08/2019 0504   MCH 27.8 03/08/2019 0504   MCHC 32.4 03/08/2019 0504   RDW 16.0 (H) 03/08/2019 0504   LYMPHSABS 0.4 (L) 03/06/2019 0312   MONOABS 0.8 03/06/2019 0312   EOSABS 0.0 03/06/2019 0312   BASOSABS 0.0 03/06/2019 0312     Studies/Results: No results found.  Medications: I have reviewed the patient's current medications.  Assessment/Plan:   1.  Functional and mobility deficits secondary to sepsis, gout and multiple medical problems. CIR 2. Resolving encephalopathy 3. DM-2 - SSI for now 4. DVT proph w/Coumadin 5. CAP on Cefepime to finish today 6. Gout - steroids 7. Prostate mass. urol appt as an OP      Length of stay, days: 1  Walker Kehr , MD 03/09/2019, 12:10 PM

## 2019-03-10 DIAGNOSIS — I10 Essential (primary) hypertension: Secondary | ICD-10-CM

## 2019-03-10 DIAGNOSIS — R05 Cough: Secondary | ICD-10-CM

## 2019-03-10 DIAGNOSIS — M1000 Idiopathic gout, unspecified site: Secondary | ICD-10-CM

## 2019-03-10 LAB — RENAL FUNCTION PANEL
Albumin: 2.1 g/dL — ABNORMAL LOW (ref 3.5–5.0)
Anion gap: 6 (ref 5–15)
BUN: 18 mg/dL (ref 8–23)
CO2: 22 mmol/L (ref 22–32)
Calcium: 9.2 mg/dL (ref 8.9–10.3)
Chloride: 110 mmol/L (ref 98–111)
Creatinine, Ser: 1.1 mg/dL (ref 0.61–1.24)
GFR calc Af Amer: >60 mL/min (ref >60)
GFR calc non Af Amer: >60 mL/min (ref >60)
Glucose, Bld: 196 mg/dL — ABNORMAL HIGH (ref 70–99)
Phosphorus: 2 mg/dL — ABNORMAL LOW (ref 2.5–4.6)
Potassium: 3.8 mmol/L (ref 3.5–5.1)
Sodium: 138 mmol/L (ref 135–145)

## 2019-03-10 LAB — GLUCOSE, CAPILLARY
Glucose-Capillary: 138 mg/dL — ABNORMAL HIGH (ref 70–99)
Glucose-Capillary: 144 mg/dL — ABNORMAL HIGH (ref 70–99)
Glucose-Capillary: 173 mg/dL — ABNORMAL HIGH (ref 70–99)
Glucose-Capillary: 256 mg/dL — ABNORMAL HIGH (ref 70–99)
Glucose-Capillary: 258 mg/dL — ABNORMAL HIGH (ref 70–99)
Glucose-Capillary: 306 mg/dL — ABNORMAL HIGH (ref 70–99)

## 2019-03-10 LAB — PROTIME-INR
INR: 2.7 — ABNORMAL HIGH (ref 0.8–1.2)
Prothrombin Time: 28.1 seconds — ABNORMAL HIGH (ref 11.4–15.2)

## 2019-03-10 MED ORDER — WARFARIN SODIUM 2 MG PO TABS
2.0000 mg | ORAL_TABLET | Freq: Once | ORAL | Status: AC
  Administered 2019-03-10: 18:00:00 2 mg via ORAL
  Filled 2019-03-10: qty 1

## 2019-03-10 MED ORDER — GUAIFENESIN-CODEINE 100-10 MG/5ML PO SOLN: 5.0000 mL | ORAL | Status: DC | PRN

## 2019-03-10 MED ORDER — PREDNISONE 20 MG PO TABS
20.0000 mg | ORAL_TABLET | Freq: Every day | ORAL | Status: DC
  Administered 2019-03-11: 09:00:00 20 mg via ORAL
  Filled 2019-03-10 (×2): qty 1

## 2019-03-10 NOTE — Progress Notes (Signed)
Ryan Rice is a 68 y.o. male Jan 05, 1951 [id number]  Subjective: The patient is complaining of dry hacking cough of several days duration.  It is been interfering in his sleep.  He was noted to have elevated blood pressure last night. Feeling OK otherwise.  Objective: Vital signs in last 24 hours: Temp:  [97.8 F (36.6 C)-98.5 F (36.9 C)] 98.3 F (36.8 C) (06/14 0615) Pulse Rate:  [66-98] 66 (06/14 0615) Resp:  [18-19] 18 (06/14 0615) BP: (149-168)/(71-79) 149/71 (06/14 1007) SpO2:  [98 %-100 %] 100 % (06/14 0615) Weight:  [98.4 kg] 98.4 kg (06/14 0615) Weight change: 3.6 kg Last BM Date: 03/08/19  Intake/Output from previous day: 06/13 0701 - 06/14 0700 In: 1175 [P.O.:1175] Out: 2175 [Urine:2175] Last cbgs: CBG (last 3)  Recent Labs    03/10/19 0001 03/10/19 0801 03/10/19 1151  GLUCAP 256* 138* 144*     Physical Exam General: No apparent distress   HEENT: not dry Lungs: Normal effort. Lungs clear to auscultation, no crackles or wheezes. Cardiovascular: Regular rate and rhythm, no edema Abdomen: S/NT/ND; BS(+) Musculoskeletal:  unchanged Neurological: No new neurological deficits Wounds: N/A    Skin: clear  Aging changes Mental state: Alert, cooperative    Lab Results: BMET    Component Value Date/Time   NA 138 03/10/2019 1028   K 3.8 03/10/2019 1028   CL 110 03/10/2019 1028   CO2 22 03/10/2019 1028   GLUCOSE 196 (H) 03/10/2019 1028   BUN 18 03/10/2019 1028   CREATININE 1.10 03/10/2019 1028   CALCIUM 9.2 03/10/2019 1028   GFRNONAA >60 03/10/2019 1028   GFRAA >60 03/10/2019 1028   CBC    Component Value Date/Time   WBC 10.0 03/09/2019 1306   RBC 4.38 03/09/2019 1306   HGB 11.9 (L) 03/09/2019 1306   HGB 7.8 (L) 02/28/2019 0331   HCT 38.4 (L) 03/09/2019 1306   PLT 164 03/09/2019 1306   MCV 87.7 03/09/2019 1306   MCH 27.2 03/09/2019 1306   MCHC 31.0 03/09/2019 1306   RDW 15.9 (H) 03/09/2019 1306   LYMPHSABS 0.2 (L) 03/09/2019 1306   MONOABS  0.2 03/09/2019 1306   EOSABS 0.0 03/09/2019 1306   BASOSABS 0.0 03/09/2019 1306    Studies/Results: No results found.  Medications: I have reviewed the patient's current medications.  Assessment/Plan:  1.  Functional and mobility deficits secondary to sepsis, gout and multiple medical problems.  CIR 2.  Resolving encephalopathy 3.  Type 2 diabetes.  Continue with insulin sliding scale for now 4.  DVT prophylaxis with Coumadin 5.  Pneumonia.  Completed the course of Cefipime. He is complaining of cough.  Start on Robitussin-AC.  Obtain a repeat chest x-ray if problems 6.  Gout.  On steroids.  Will start to reduce prednisone dose 7.  Prostate mass.  He will see a urologist as an outpatient 8.  Elevated blood pressure last night.  Will continue to monitor      Length of stay, days: 2  Walker Kehr , MD 03/10/2019, 12:39 PM

## 2019-03-10 NOTE — Progress Notes (Signed)
ANTICOAGULATION CONSULT NOTE - Follow Up Consult  Pharmacy Consult for Warfarin Indication: hx of DVT  Allergies  Allergen Reactions  . Iodinated Diagnostic Agents Hives  . Tape Hives   Patient Measurements: Height: 5\' 11"  (180.3 cm) Weight: 216 lb 14.9 oz (98.4 kg) IBW/kg (Calculated) : 75.3  Vital Signs: Temp: 98.3 F (36.8 C) (06/14 0615) Temp Source: Oral (06/14 0615) BP: 149/71 (06/14 1007) Pulse Rate: 66 (06/14 0615)  Labs: Recent Labs    03/08/19 0504 03/09/19 1306  HGB 9.9* 11.9*  HCT 30.6* 38.4*  PLT 106* 164  LABPROT 27.3* 25.8*  INR 2.6* 2.4*  CREATININE 1.02 1.01   Estimated Creatinine Clearance: 83.7 mL/min (by C-G formula based on SCr of 1.01 mg/dL).  Assessment: 68 yo M transfer from outside hospital with diarrhea/dyspnea. On warfarin PTA for history of DVT. Pt was briefly on heparin gtt on 6/7, now pharmacy consulted to manage warfarin therapy.    PTA warfarin regimen: 5mg  on Wed/Sun, 2.5mg  all other days. INR was 7.4 on admit.  INR today 2.7. (6/13) Hgb improved to 11.9, Pltc improved to WNL. No bleeding reported.    Note possible drug-drug interaction with Bactrim, however Bactrim was also a PTA medication.  Goal of Therapy:  INR 2-3 Monitor platelets by anticoagulation protocol: Yes   Plan:  Warfarin 2mg  PO x 1 today Daily PT/INR Monitor closely for s/sx of bleeding  Minda Ditto PharmD Clinical Pharmacist [phone] 03/10/2019,10:43 AM

## 2019-03-11 ENCOUNTER — Inpatient Hospital Stay (HOSPITAL_COMMUNITY): Payer: Medicare HMO

## 2019-03-11 ENCOUNTER — Inpatient Hospital Stay (HOSPITAL_COMMUNITY): Payer: Medicare HMO | Admitting: Occupational Therapy

## 2019-03-11 DIAGNOSIS — R600 Localized edema: Secondary | ICD-10-CM

## 2019-03-11 DIAGNOSIS — E1165 Type 2 diabetes mellitus with hyperglycemia: Secondary | ICD-10-CM

## 2019-03-11 LAB — RENAL FUNCTION PANEL
Albumin: 2.2 g/dL — ABNORMAL LOW (ref 3.5–5.0)
Anion gap: 7 (ref 5–15)
BUN: 19 mg/dL (ref 8–23)
CO2: 22 mmol/L (ref 22–32)
Calcium: 9.4 mg/dL (ref 8.9–10.3)
Chloride: 110 mmol/L (ref 98–111)
Creatinine, Ser: 0.88 mg/dL (ref 0.61–1.24)
GFR calc Af Amer: >60 mL/min (ref >60)
GFR calc non Af Amer: >60 mL/min (ref >60)
Glucose, Bld: 134 mg/dL — ABNORMAL HIGH (ref 70–99)
Phosphorus: 2 mg/dL — ABNORMAL LOW (ref 2.5–4.6)
Potassium: 3.7 mmol/L (ref 3.5–5.1)
Sodium: 139 mmol/L (ref 135–145)

## 2019-03-11 LAB — PROTIME-INR
INR: 2.1 — ABNORMAL HIGH (ref 0.8–1.2)
Prothrombin Time: 23.3 seconds — ABNORMAL HIGH (ref 11.4–15.2)

## 2019-03-11 LAB — GLUCOSE, CAPILLARY
Glucose-Capillary: 115 mg/dL — ABNORMAL HIGH (ref 70–99)
Glucose-Capillary: 117 mg/dL — ABNORMAL HIGH (ref 70–99)
Glucose-Capillary: 167 mg/dL — ABNORMAL HIGH (ref 70–99)
Glucose-Capillary: 223 mg/dL — ABNORMAL HIGH (ref 70–99)
Glucose-Capillary: 240 mg/dL — ABNORMAL HIGH (ref 70–99)
Glucose-Capillary: 243 mg/dL — ABNORMAL HIGH (ref 70–99)
Glucose-Capillary: 310 mg/dL — ABNORMAL HIGH (ref 70–99)

## 2019-03-11 MED ORDER — KETOTIFEN FUMARATE 0.025 % OP SOLN
1.0000 [drp] | Freq: Two times a day (BID) | OPHTHALMIC | Status: DC
  Filled 2019-03-11: qty 5

## 2019-03-11 MED ORDER — DORZOLAMIDE HCL-TIMOLOL MAL 2-0.5 % OP SOLN
1.0000 [drp] | Freq: Two times a day (BID) | OPHTHALMIC | Status: DC
  Administered 2019-03-11 – 2019-03-16 (×10): 1 [drp] via OPHTHALMIC
  Filled 2019-03-11: qty 10

## 2019-03-11 MED ORDER — WARFARIN SODIUM 3 MG PO TABS
3.0000 mg | ORAL_TABLET | Freq: Once | ORAL | Status: AC
  Administered 2019-03-11: 3 mg via ORAL
  Filled 2019-03-11: qty 1

## 2019-03-11 MED ORDER — ARTIFICIAL TEARS OPHTHALMIC OINT: TOPICAL_OINTMENT | Freq: Every day | OPHTHALMIC | Status: DC

## 2019-03-11 MED ORDER — LABETALOL HCL 100 MG PO TABS
100.0000 mg | ORAL_TABLET | Freq: Two times a day (BID) | ORAL | Status: DC
  Administered 2019-03-11 – 2019-03-16 (×8): 100 mg via ORAL
  Filled 2019-03-11 (×10): qty 1

## 2019-03-11 MED ORDER — ARTIFICIAL TEARS OPHTHALMIC OINT
TOPICAL_OINTMENT | Freq: Two times a day (BID) | OPHTHALMIC | Status: DC
  Administered 2019-03-11: 1 via OPHTHALMIC
  Administered 2019-03-12 – 2019-03-16 (×9): via OPHTHALMIC
  Filled 2019-03-11: qty 3.5

## 2019-03-11 MED ORDER — DORZOLAMIDE HCL-TIMOLOL MAL 2-0.5 % OP SOLN
1.0000 [drp] | Freq: Two times a day (BID) | OPHTHALMIC | Status: DC
  Filled 2019-03-11: qty 10

## 2019-03-11 MED ORDER — KETOTIFEN FUMARATE 0.025 % OP SOLN
1.0000 [drp] | Freq: Two times a day (BID) | OPHTHALMIC | Status: DC
  Administered 2019-03-11 – 2019-03-16 (×10): 1 [drp] via OPHTHALMIC
  Filled 2019-03-11: qty 5

## 2019-03-11 NOTE — Progress Notes (Signed)
[place] PHYSICAL MEDICINE & REHABILITATION PROGRESS NOTE   Subjective/Complaints:  No issues overnite  Pt is couging less, had IS but cannot find Discussed fall left knee PTA Discussed leg swelling, (had comp stocking at home)   ROS:  Denies CP, SOB, N/V/D  Objective:   No results found. Recent Labs    03/09/19 1306  WBC 10.0  HGB 11.9*  HCT 38.4*  PLT 164   Recent Labs    03/09/19 1306 03/10/19 1028  NA 138 138  K 3.7 3.8  CL 106 110  CO2 21* 22  GLUCOSE 153* 196*  BUN 19 18  CREATININE 1.01 1.10  CALCIUM 9.6 9.2    Intake/Output Summary (Last 24 hours) at 03/11/2019 0639 Last data filed at 03/11/2019 0401 Gross per 24 hour  Intake 1144 ml  Output 2200 ml  Net -1056 ml     Physical Exam: Vital Signs Blood pressure (!) 161/69, pulse 71, temperature 98.5 F (36.9 C), temperature source Oral, resp. rate 19, height 5\' 11"  (1.803 m), weight 94.2 kg, SpO2 96 %.     Assessment/Plan: 1. Functional deficits secondary to Debility following sepsis which require 3+ hours per day of interdisciplinary therapy in a comprehensive inpatient rehab setting.  Physiatrist is providing close team supervision and 24 hour management of active medical problems listed below.  Physiatrist and rehab team continue to assess barriers to discharge/monitor patient progress toward functional and medical goals  Care Tool:  Bathing    Body parts bathed by patient: Right arm, Left arm, Chest, Abdomen, Front perineal area, Left upper leg, Right upper leg, Right lower leg, Left lower leg, Face   Body parts bathed by helper: Buttocks     Bathing assist Assist Level: Minimal Assistance - Patient > 75%     Upper Body Dressing/Undressing Upper body dressing   What is the patient wearing?: (hoodie)    Upper body assist Assist Level: Minimal Assistance - Patient > 75%    Lower Body Dressing/Undressing Lower body dressing      What is the patient wearing?: Underwear/pull up,  Pants     Lower body assist Assist for lower body dressing: Minimal Assistance - Patient > 75%     Toileting Toileting    Toileting assist Assist for toileting: Independent with assistive device Assistive Device Comment: urinal   Transfers Chair/bed transfer  Transfers assist     Chair/bed transfer assist level: Minimal Assistance - Patient > 75%     Locomotion Ambulation   Ambulation assist      Assist level: Minimal Assistance - Patient > 75% Assistive device: Walker-rolling Max distance: 26ft   Walk 10 feet activity   Assist     Assist level: Minimal Assistance - Patient > 75% Assistive device: Walker-rolling   Walk 50 feet activity   Assist    Assist level: Minimal Assistance - Patient > 75% Assistive device: Walker-rolling    Walk 150 feet activity   Assist Walk 150 feet activity did not occur: Safety/medical concerns         Walk 10 feet on uneven surface  activity   Assist     Assist level: Minimal Assistance - Patient > 75%(up/down ramp) Assistive device: Aeronautical engineer Will patient use wheelchair at discharge?: (TBD but anticipate will not use w/c)             Wheelchair 50 feet with 2 turns activity    Assist  Wheelchair 150 feet activity     Assist          Medical Problem List and Plan: 1.  Functional and mobility deficits secondary to sepsis, gout and multiple medical considerations. Resolving encephalopathy             -CIR PT, OT  2.  Antithrombotics: -DVT/anticoagulation:  Pharmaceutical: Coumadin             -antiplatelet therapy: N/A 3. Pain Management: tylenol prn 4. Mood: LCSW to follow for evaluation and support.              -antipsychotic agents: N/A 5. Neuropsych: This patient is capable of making decisions on his own behalf. 6. Skin/Wound Care: Routine pressure relief measures 7. Fluids/Electrolytes/Nutrition: Strict I/O. Daily weights. 8.  T2DM: Was on actos, metformin and Januvia. Monitor BS ac/hs and continue SSI for now CBG (last 3)  Recent Labs    03/10/19 2001 03/10/19 2345 03/11/19 0403  GLUCAP 306* 223* 115*  good this am , elevated last pm  9. 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  10. CAP:  Completed Cefepime- 11. Cough: Continue IS.  12. Gout flare: Better with increased dose prednisone but was still in a good deal of pain today. Observe with therapies.              -continue prednisone, tapering to off.   13. Left prostatic mass/Right renal mass: follow up with GU on outpatient basis.      LOS: 3 days A FACE TO [place] E Ryan Rice 03/11/2019, 6:39 AM

## 2019-03-11 NOTE — IPOC Note (Addendum)
Overall Plan of Care [facility]) Patient Details Name: Melroy Bougher MRN: [id number] DOB: 1951/08/27  Admitting Diagnosis: <principal problem not specified>  Hospital Problems: Active Problems:   Debility     Functional Problem List: Nursing Pain, Safety, Endurance, Medication Management, Skin Integrity  PT Balance, Safety, Sensory, Motor, Skin Integrity, Pain, Endurance, Edema  OT Balance, Endurance, Edema, Motor, Pain, Sensory  SLP    TR         Basic ADL's: OT Grooming, Bathing, Dressing, Toileting     Advanced  ADL's: OT       Transfers: PT Bed Mobility, Bed to Chair, Car, Manufacturing systems engineer, Metallurgist: PT Ambulation, Stairs     Additional Impairments: OT None  SLP        TR      Anticipated Outcomes Item Anticipated Outcome  Self Feeding    Swallowing      Basic self-care  mod I  Toileting  mod I   Bathroom Transfers mod I/supervision  Bowel/Bladder  Supervision  Transfers  mod-I  Locomotion  supervision  Communication     Cognition     Pain  <3 on 0-10 pain scale  Safety/Judgment  min assist with rolling walker   Therapy Plan: PT Intensity: Minimum of 1-2 x/day ,45 to 90 minutes PT Frequency: 5 out of 7 days PT Duration Estimated Length of Stay: 9-12 days OT Intensity: Minimum of 1-2 x/day, 45 to 90 minutes OT Frequency: 5 out of 7 days OT Duration/Estimated Length of Stay: 9-12     Due to the current state of emergency, patients may not be receiving their 3-hours of Medicare-mandated therapy.   Team Interventions: Nursing Interventions Patient/Family Education, Disease Management/Prevention, Skin Care/Wound Management, Medication Management, Pain Management, Discharge Planning, Psychosocial Support  PT interventions Ambulation/gait training, Community reintegration, DME/adaptive equipment instruction, Neuromuscular re-education, Psychosocial support, Stair training, UE/LE Strength taining/ROM, Human resources officer, Discharge planning, Functional electrical stimulation, Pain management, Skin care/wound management, Therapeutic Activities, UE/LE Coordination activities, Disease management/prevention, Functional mobility training, Patient/family education, Therapeutic Exercise, Splinting/orthotics  OT Interventions Training and development officer, Community reintegration, Discharge planning, Disease mangement/prevention, Engineer, drilling, Functional mobility training, Pain management, Patient/family education, Psychosocial support, Self Care/advanced ADL retraining, Therapeutic Activities, Therapeutic Exercise, UE/LE Strength taining/ROM, UE/LE Coordination activities  SLP Interventions    TR Interventions    SW/CM Interventions Psychosocial Support, Patient/Family Education, Discharge Planning   Barriers to Discharge MD  Medical stability  Nursing Inaccessible home environment, Decreased caregiver support, Lack of/limited family support    PT      OT      SLP      SW       Team Discharge Planning: Destination: PT-Home ,OT- Home , SLP-  Projected Follow-up: PT-24 hour supervision/assistance, Home health PT, OT-  Home health OT, SLP-  Projected Equipment Needs: PT-To be determined, Other (comment), OT- To be determined, SLP-  Equipment Details: PT-pt has none, OT-  Patient/family involved in discharge planning: PT- Patient,  OT-Patient, SLP-   MD ELOS: 11-15d Medical Rehab Prognosis:  Good Assessment:  68 year old male with history of ESRD s/p renal transplant, CAF, T2DM,who was admitted on 02/27/2019 with diarrhea and abdominal pain. He was noted to be hypoxic with increased WOB due to extensive left greater than right PNA with sepsis. He was placed on BiPAP and started on broad-spectrum antibiotics however developed hypotension with worsening of respiratory status. He was treated with pressors, stress dose steroids and was intubated on 6/04. Nephrology  consulted for input  on acute on chronic renal failure with metabolic acidosis and he was treated with bicarb. He tolerated extubation by 06/06 and urine output/renal status is improving. Elevated troponins felt to be due to demand ischemia patient cardiology follow-up. Abdominal ultrasound showed increased attenuation around the left prostate as well as mass arising from --he will need urology follow-up afterdischarge. His blood cultures were positive for AcinetobacterCalcoacetius and coag negative staphand antibiotics narrowed to Unasyn recommendation to complete 10 days total of antibiotic course.Myfortic resumed 06/09.He developed pain bilateral feet felt to be due to gout flare and prednisone increased to 40 mg for treatmenton 6/10. Hypokalemia hypomagnesemia have resolved with supplementation.    Now requiring 24/7 Rehab RN,MD, as well as CIR level PT, OT and SLP.  Treatment team will focus on ADLs and mobility with goals set at Mod I See Team Conference Notes for weekly updates to the plan of care

## 2019-03-11 NOTE — Progress Notes (Signed)
ANTICOAGULATION CONSULT NOTE - Follow Up Consult  Pharmacy Consult for Warfarin Indication: hx of DVT  Allergies  Allergen Reactions  . Iodinated Diagnostic Agents Hives  . Tape Hives   Patient Measurements: Height: 5\' 11"  (180.3 cm) Weight: 207 lb 10.8 oz (94.2 kg) IBW/kg (Calculated) : 75.3  Vital Signs: Temp: 98.5 F (36.9 C) (06/15 0407) Temp Source: Oral (06/15 0407) BP: 161/69 (06/15 0407) Pulse Rate: 71 (06/15 0407)  Labs: Recent Labs    03/09/19 1306 03/10/19 1028 03/11/19 0742  HGB 11.9*  --   --   HCT 38.4*  --   --   PLT 164  --   --   LABPROT 25.8* 28.1* 23.3*  INR 2.4* 2.7* 2.1*  CREATININE 1.01 1.10 0.88   Estimated Creatinine Clearance: 94.2 mL/min (by C-G formula based on SCr of 0.88 mg/dL).  Assessment: 68 yo M transfer from outside hospital with diarrhea/dyspnea. On warfarin PTA for history of DVT. Pt was briefly on heparin gtt on 6/7, now pharmacy consulted to manage warfarin therapy.    PTA warfarin regimen: 5mg  on Wed/Sun, 2.5mg  all other days. INR was 7.4 on admit.  INR today 2.1. (6/13) Hgb improved to 11.9 (6/13), Pltc improved to WNL. No bleeding reported.    Note possible drug-drug interaction with Bactrim, however Bactrim was also a PTA medication.  Goal of Therapy:  INR 2-3 Monitor platelets by anticoagulation protocol: Yes   Plan:  Warfarin 3mg  PO x 1 today Daily PT/INR Monitor closely for s/sx of bleeding  Minda Ditto PharmD Clinical Pharmacist [phone] 03/11/2019,12:31 PM

## 2019-03-11 NOTE — Progress Notes (Signed)
Occupational Therapy Session Note  Patient Details  Name: Ryan Rice MRN: [id number] Date of Birth: 1950-12-29  Today's Date: 03/11/2019 OT Individual Time: 1420-1530 OT Individual Time Calculation (min): 70 min    Short Term Goals: Week 1:  OT Short Term Goal 1 (Week 1): Pt will tolerate standing at the sink for 5 minutes within BADL task OT Short Term Goal 2 (Week 1): Pt will complete LB dressing with supervision OT Short Term Goal 3 (Week 1): Pt will complete toilet transfer with CGA  Skilled Therapeutic Interventions/Progress Updates:    Pt completed functional mobility down to the ADL apartment with supervision and mod instructional cueing to maintain upright posture.  He then practiced a walk-in shower transfer with use of a 3:1 for support and no assistive device with min facilitation from the therapist.  He was able to the ambulate to the therapy gym with supervision using the RW.  He worked on balance sit to stand from the therapy mat and from the standard chair.  Had pt stand for intervals of 1 min to work on balance.  He was limited by back pain with regards to standing endurance.  He worked on standing with feet together, feet apart, eyes closed, head rotations, and cervical extension, all with min guard assist.  Had him work on completion of 3 sets of 10 reps using the Rebounder with use of 1 Kg ball in standing with overall min assist.  Once again he was limited by back pain.  Had pt finish session with ambulation back to the room with use of the RW for support and min guard assist.  Pt left in the bedside recliner per his choice with call button and phone in reach and chair alarm in place.     Therapy Documentation Precautions:  Precautions Precautions: Fall Restrictions Weight Bearing Restrictions: No   Pain: Pain Assessment Pain Scale: Faces Faces Pain Scale: Hurts [place] more Pain Type: Acute pain Pain Location: Back Pain Descriptors / Indicators: Discomfort Pain  Onset: With Activity Pain Intervention(s): Repositioned   Therapy/Group: Individual Therapy  Apple Dearmas OTR/L 03/11/2019, 3:39 PM

## 2019-03-11 NOTE — Progress Notes (Signed)
Patient's (L) Eye noted reddened in color. Conjunctiva red and swollen. Patient c/o discomfort to (L) Eye. Denies blurred vision.  Patient states, "it wasn't like that last night. I don't know what happened". PA notified of findings via telephone.

## 2019-03-11 NOTE — Progress Notes (Signed)
Physical Therapy Session Note  Patient Details  Name: Ryan Rice MRN: [id number] Date of Birth: 1951/04/09  Today's Date: 03/11/2019 PT Individual Time: 0917-1030 and 1130- 1230 PT Individual Time Calculation (min): 73 min and 60 min   Short Term Goals: Week 1:  PT Short Term Goal 1 (Week 1): Patient will perform bed<>chair transfers with supervision PT Short Term Goal 2 (Week 1): Patient will ambulate at least 144ft using LRAD with no more than CGA PT Short Term Goal 3 (Week 1): Patient will ascend/descend 4 steps using handrails with no more than CGA  Skilled Therapeutic Interventions/Progress Updates:    Session 1:  Pt supine in bed upon PT arrival, agreeable to therapy tx and denies pain. Pt transferred to sitting with supervision. Pt reports having to use the bathroom, ambulated 2 x 10 ft from bed<>toilet with min assist and RW, standing balance with CGA while pt performed clothing management and pericare. Pt continent of bladder, pt performed w/c level bathing this session with set up assist, sit<>stands with CGA to wash legs and bottom. Pt donned clean clothes with min assist to loop R LE through pants, sit<>stand to pull pants over hips with supervision, donned shirt with supervision. Pt ambulated to the gym x 135 ft and then x 60 ft with RW and min assist, seated rest break between bouts secondary to fatigue. Pt reports he did not sleep well last night. Pt worked on LE strength to perform 2 x 5 sit<>stands without UE support, cues for techniques. Pt worked on standing balance without UE support to perform horseshoe toss activity x 2 trials with CGA and then to perform toe taps on aerobic step x 2 trials with min-mod assist. Pt ambulated x 120 ft with RW and min assist working on endurance and activity tolerance, cues for upright posture. Pt performed standing therex for LE strengthening with UE support on RW to perform 2 x 10 each: hamstring curls, hip flexion marches, calf raises, and  seated ankle DF, therapist providing cues for proper techniques. Pt standing on red wedge this session without UE support for standing balance and also for heel cord stretching 2 x 1 min. Pt ambulated back to the room x195 ft with RW and CGA, left seated in w/c with needs in reach and chair alarm set.   Session 2: Pt seated in w/c upon PT arrival, agreeable to therapy tx and denies pain. Pt ambulated to the dayroom x 120 ft with RW and CGA. Pt worked on dynamic standing balance without UE support while participating in a game of cornhole, reaching outside BOS for bean bags, CGA. Pt ambulated x 100 ft with RW and CGA working on activity tolerance and endurance. RN brought teds over, donned teds this session total assist for LE edema management. Pt ambulated to the nustep and used nustep this session on workload 5 for global strengthening and endurance x 8 minutes. Pt ambulated to the gym with RW and CGA x 150 ft. Pt worked on dynamic standing balance while weaving through cones with UE support on RW, CGA x 2 trials. Pt worked on dynamic standing balance without UE support to perform stepping task in place x 10 steps forwards/backwards, x 10 lateral steps in each direction. Pt ambulated 2 x 30 ft without AD this session working on balance, min assist and cues for upright posture. Pt worked on dynamic standing balance to participate in ball toss, CGA. Pt ambulated back to room x 195 ft with RW and CGA,  ambulated into bathroom and continent of bladder. Pt transferred to recliner and left seated with needs in reach and chair alarm set.    Therapy Documentation Precautions:  Precautions Precautions: Fall Restrictions Weight Bearing Restrictions: No    Therapy/Group: Individual Therapy  Netta Corrigan, PT, DPT 03/11/2019, 7:52 AM

## 2019-03-11 NOTE — Progress Notes (Signed)
Patient information reviewed and entered into [facility] by Becky Brently Voorhis, PPS coordinator. Information including medical coding, function ability, and quality indicators will be reviewed and updated through discharge.   

## 2019-03-11 NOTE — Plan of Care (Signed)
  Problem: Consults Goal: RH GENERAL PATIENT EDUCATION Description: See Patient Education module for education specifics. Outcome: Progressing Goal: Diabetes Guidelines if Diabetic/Glucose > 140 Description: If diabetic or lab glucose is > 140 mg/dl - Initiate Diabetes/Hyperglycemia Guidelines & Document Interventions  Outcome: Not Progressing   Problem: RH SKIN INTEGRITY Goal: RH STG MAINTAIN SKIN INTEGRITY WITH ASSISTANCE Description: STG Maintain Skin Integrity With Supervision Assistance. Outcome: Progressing   Problem: RH SAFETY Goal: RH STG ADHERE TO SAFETY PRECAUTIONS W/ASSISTANCE/DEVICE Description: STG Adhere to Safety Precautions With Supervision Assistance and appropriate assistive Device. Outcome: Progressing   Problem: RH PAIN MANAGEMENT Goal: RH STG PAIN MANAGED AT OR BELOW PT'S PAIN GOAL Description: <3 on a 0-10 pain scale. Outcome: Progressing   Problem: RH KNOWLEDGE DEFICIT GENERAL Goal: RH STG INCREASE KNOWLEDGE OF SELF CARE AFTER HOSPITALIZATION Description: Patient will demonstrate knowledge of prescribed medications, diabetic medications, blood sugar parameters, and follow up care with the MD post discharge with min assistance from rehab staff. Outcome: Progressing

## 2019-03-12 ENCOUNTER — Inpatient Hospital Stay (HOSPITAL_COMMUNITY): Payer: Medicare HMO | Admitting: Occupational Therapy

## 2019-03-12 ENCOUNTER — Inpatient Hospital Stay (HOSPITAL_COMMUNITY): Payer: Medicare HMO

## 2019-03-12 LAB — RENAL FUNCTION PANEL
Albumin: 2.1 g/dL — ABNORMAL LOW (ref 3.5–5.0)
Anion gap: 6 (ref 5–15)
BUN: 22 mg/dL (ref 8–23)
CO2: 21 mmol/L — ABNORMAL LOW (ref 22–32)
Calcium: 9.7 mg/dL (ref 8.9–10.3)
Chloride: 112 mmol/L — ABNORMAL HIGH (ref 98–111)
Creatinine, Ser: 1.02 mg/dL (ref 0.61–1.24)
GFR calc Af Amer: >60 mL/min (ref >60)
GFR calc non Af Amer: >60 mL/min (ref >60)
Glucose, Bld: 101 mg/dL — ABNORMAL HIGH (ref 70–99)
Phosphorus: 2.1 mg/dL — ABNORMAL LOW (ref 2.5–4.6)
Potassium: 4.3 mmol/L (ref 3.5–5.1)
Sodium: 139 mmol/L (ref 135–145)

## 2019-03-12 LAB — GLUCOSE, CAPILLARY
Glucose-Capillary: 102 mg/dL — ABNORMAL HIGH (ref 70–99)
Glucose-Capillary: 125 mg/dL — ABNORMAL HIGH (ref 70–99)
Glucose-Capillary: 100 mg/dL — ABNORMAL HIGH (ref 70–99)
Glucose-Capillary: 267 mg/dL — ABNORMAL HIGH (ref 70–99)
Glucose-Capillary: 259 mg/dL — ABNORMAL HIGH (ref 70–99)

## 2019-03-12 LAB — PROTIME-INR
INR: 2.2 — ABNORMAL HIGH (ref 0.8–1.2)
Prothrombin Time: 24 seconds — ABNORMAL HIGH (ref 11.4–15.2)

## 2019-03-12 MED ORDER — INSULIN ASPART 100 UNIT/ML ~~LOC~~ SOLN
0.0000 [IU] | Freq: Every day | SUBCUTANEOUS | Status: DC
  Administered 2019-03-12 – 2019-03-13 (×2): 3 [IU] via SUBCUTANEOUS
  Administered 2019-03-14: 2 [IU] via SUBCUTANEOUS

## 2019-03-12 MED ORDER — PREDNISONE 5 MG PO TABS
10.0000 mg | ORAL_TABLET | Freq: Every day | ORAL | Status: DC
  Administered 2019-03-12 – 2019-03-16 (×5): 10 mg via ORAL
  Filled 2019-03-12 (×4): qty 2

## 2019-03-12 MED ORDER — WARFARIN SODIUM 2.5 MG PO TABS
2.5000 mg | ORAL_TABLET | Freq: Once | ORAL | Status: AC
  Administered 2019-03-12: 2.5 mg via ORAL
  Filled 2019-03-12: qty 1

## 2019-03-12 MED ORDER — INSULIN ASPART 100 UNIT/ML ~~LOC~~ SOLN
0.0000 [IU] | Freq: Three times a day (TID) | SUBCUTANEOUS | Status: DC
  Administered 2019-03-12: 5 [IU] via SUBCUTANEOUS
  Administered 2019-03-13: 3 [IU] via SUBCUTANEOUS
  Administered 2019-03-13 (×2): 2 [IU] via SUBCUTANEOUS
  Administered 2019-03-14: 3 [IU] via SUBCUTANEOUS
  Administered 2019-03-14: 9 [IU] via SUBCUTANEOUS
  Administered 2019-03-14: 2 [IU] via SUBCUTANEOUS
  Administered 2019-03-15: 3 [IU] via SUBCUTANEOUS
  Administered 2019-03-15: 2 [IU] via SUBCUTANEOUS
  Administered 2019-03-15: 5 [IU] via SUBCUTANEOUS
  Administered 2019-03-16: 08:00:00 1 [IU] via SUBCUTANEOUS

## 2019-03-12 NOTE — Progress Notes (Signed)
Occupational Therapy Session Note  Patient Details  Name: Ryan Rice MRN: [id number] Date of Birth: 03/20/1951  Today's Date: 03/12/2019 OT Individual Time: 2229-7989 OT Individual Time Calculation (min): 75 min    Short Term Goals: Week 1:  OT Short Term Goal 1 (Week 1): Pt will tolerate standing at the sink for 5 minutes within BADL task OT Short Term Goal 2 (Week 1): Pt will complete LB dressing with supervision OT Short Term Goal 3 (Week 1): Pt will complete toilet transfer with CGA  Skilled Therapeutic Interventions/Progress Updates:    [name] ambulated down to the therapy gym with supervision.  Once in the gym had him work on standing balance with use of the foam surface to stand.  Min assist for balance when stepping onto the foam and off.  He was able to maintain static standing with min assist as well secondary to forward trunk flexion and LOB forward.  He was only able to tolerate standing for approximately one minute before needing to sit secondary to back pain.  Had him work on quadriped, alternating reaching to promote lumbar extension and shoulder flexion.  Also worked in tall kneeling with BUE support from therapist kneeling in front of him.  He transitioned to prone as well with mod assist for scapular adduction with shoulders flexed at 90 degrees.  Worked on BUE shoulder strengthening in supine for 3 sets of 10 reps, alternating left and right.  In sitting pt with decreased right shoulder flexion AROM compared to the left.  Shoulder hiking and strength 2+/5 on the right with 3/5 on the left.  Finished therapy with shoulder extension exercises using medium resistance therapy band for 2 sets of 10 reps.  Had pt ambulate back to the room with transfer to the wheelchair to rest.  Call button and phone in reach with safety alarm belt in place.     Therapy Documentation Precautions:  Precautions Precautions: Fall Restrictions Weight Bearing Restrictions: No  Pain: Pain  Assessment Pain Scale: Faces Faces Pain Scale: Hurts a Leahy bit Pain Type: Acute pain Pain Location: Back Pain Descriptors / Indicators: Discomfort Pain Onset: With Activity Pain Intervention(s): Repositioned  Therapy/Group: Individual Therapy  [name] OTR/L 03/12/2019, 4:13 PM

## 2019-03-12 NOTE — Progress Notes (Signed)
Physical Therapy Session Note  Patient Details  Name: Ryan Rice MRN: [id number] Date of Birth: 05/29/1951  Today's Date: 03/12/2019 PT Individual Time: 1001-1100 PT Individual Time Calculation (min): 59 min   Short Term Goals: Week 1:  PT Short Term Goal 1 (Week 1): Patient will perform bed<>chair transfers with supervision PT Short Term Goal 2 (Week 1): Patient will ambulate at least 149ft using LRAD with no more than CGA PT Short Term Goal 3 (Week 1): Patient will ascend/descend 4 steps using handrails with no more than CGA  Skilled Therapeutic Interventions/Progress Updates:    Pt seated in recliner upon PT arrival, agreeable to therapy tx and denies pain. Pt transferred to standing with supervision and ambulated into bathroom with RW and CGA x 10 ft, pt performed clothing management without assist, continent of bladder. Pt ambulated to the sink and maintained balance with supervision while washing hands. Pt ambulated to the dayroom x 120 ft with RW and CGA, pt transferred to nustep this session. Pt used nustep x 6 minutes this session on workload 6 for global strengthening and endurance. Pt ambulated to the gym x 150 ft with RW and CGA. Pt worked on dynamic standing balance this session without AD in order to perform the following: toe taps on colored cones, forwards/backwards ambulation 3 x 10 ft in each direction, and standing ball toss, all with min-mod assist. Pt reports pain/tightness in low back, pt transferred to supine on the mat with supervision and instructed in low back stretches including lumbar twists/rotation in hooklying and B knees to chest stretch. Pt performed 2 x 10 bridges for hip extensor strengthening, cues for techniques. Pt reports having to use bathroom again, ambulated from gym<>rehab apartment 2 x 60 ft with RW and CGA to use bathroom with supervision for standing balance. Pt performed 2 x 10 mini squats this session for LE strengthening, cues for techniques. Pt  ascended/descended 1 platform step 4 inches x 2 this session using RW with CGA and cues for techniques, simulating home set up. Pt ambulated back to room and left in recliner with needs in reach and chair alarm set.   Therapy Documentation Precautions:  Precautions Precautions: Fall Restrictions Weight Bearing Restrictions: No   Therapy/Group: Individual Therapy  Netta Corrigan, PT, DPT 03/12/2019, 7:57 AM

## 2019-03-12 NOTE — Progress Notes (Signed)
[facility] PHYSICAL MEDICINE & REHABILITATION PROGRESS NOTE   Subjective/Complaints:   No issues overnite  ROS:  Denies CP, SOB, N/V/D  Objective:   No results found. Recent Labs    03/09/19 1306  WBC 10.0  HGB 11.9*  HCT 38.4*  PLT 164   Recent Labs    03/10/19 1028 03/11/19 0742  NA 138 139  K 3.8 3.7  CL 110 110  CO2 22 22  GLUCOSE 196* 134*  BUN 18 19  CREATININE 1.10 0.88  CALCIUM 9.2 9.4    Intake/Output Summary (Last 24 hours) at 03/12/2019 0734 Last data filed at 03/12/2019 6767 Gross per 24 hour  Intake 720 ml  Output 1500 ml  Net -780 ml     Physical Exam: Vital Signs Blood pressure (!) 160/68, pulse (!) 57, temperature 98.1 F (36.7 C), resp. rate 17, height 5\' 11"  (1.803 m), weight 94 kg, SpO2 96 %.   General: No acute distress Mood and affect are appropriate Eyes Left subconjunctival hemorrhage, no drainage, able to read print with each eye denies blurriness on Left side  Heart: Regular rate and rhythm no rubs murmurs or extra sounds Lungs: Clear to auscultation, breathing unlabored, no rales or wheezes Abdomen: Positive bowel sounds, soft nontender to palpation, nondistended Extremities: No clubbing, cyanosis, or edema Skin: No evidence of breakdown, no evidence of rash Neurologic: Cranial nerves II through XII intact, motor strength is 4/5 in bilateral deltoid, bicep, tricep, grip, hip flexor, knee extensors, ankle dorsiflexor and plantar flexor Sensory exam normal sensation to light touch and proprioception in bilateral upper and lower extremities  Musculoskeletal: Full range of motion in all 4 extremities. No joint swelling   Assessment/Plan: 1. Functional deficits secondary to Debility following sepsis which require 3+ hours per day of interdisciplinary therapy in a comprehensive inpatient rehab setting.  Physiatrist is providing close team supervision and 24 hour management of active medical problems listed below.  Physiatrist and  rehab team continue to assess barriers to discharge/monitor patient progress toward functional and medical goals  Care Tool:  Bathing    Body parts bathed by patient: Right arm, Left arm, Chest, Abdomen, Front perineal area, Left upper leg, Right upper leg, Right lower leg, Left lower leg, Face   Body parts bathed by helper: Buttocks     Bathing assist Assist Level: Minimal Assistance - Patient > 75%     Upper Body Dressing/Undressing Upper body dressing   What is the patient wearing?: Pull over shirt    Upper body assist Assist Level: Moderate Assistance - Patient 50 - 74%    Lower Body Dressing/Undressing Lower body dressing      What is the patient wearing?: Pants     Lower body assist Assist for lower body dressing: Minimal Assistance - Patient > 75%     Toileting Toileting    Toileting assist Assist for toileting: Contact Guard/Touching assist Assistive Device Comment: urinal   Transfers Chair/bed transfer  Transfers assist     Chair/bed transfer assist level: Minimal Assistance - Patient > 75%     Locomotion Ambulation   Ambulation assist      Assist level: Minimal Assistance - Patient > 75% Assistive device: Walker-rolling Max distance: 135 ft   Walk 10 feet activity   Assist     Assist level: Minimal Assistance - Patient > 75% Assistive device: Walker-rolling   Walk 50 feet activity   Assist    Assist level: Minimal Assistance - Patient > 75% Assistive device: Walker-rolling  Walk 150 feet activity   Assist Walk 150 feet activity did not occur: Safety/medical concerns         Walk 10 feet on uneven surface  activity   Assist     Assist level: Minimal Assistance - Patient > 75%(up/down ramp) Assistive device: Walker-rolling   Wheelchair     Assist Will patient use wheelchair at discharge?: (TBD but anticipate will not use w/c)             Wheelchair 50 feet with 2 turns activity    Assist             Wheelchair 150 feet activity     Assist          Medical Problem List and Plan: 1.  Functional and mobility deficits secondary to sepsis, gout and multiple medical considerations. Resolving encephalopathy             -CIR PT, OT team conf in am  2.  Antithrombotics: -DVT/anticoagulation:  Pharmaceutical: Coumadin             -antiplatelet therapy: N/A 3. Pain Management: tylenol prn 4. Mood: LCSW to follow for evaluation and support.              -antipsychotic agents: N/A 5. Neuropsych: This patient is capable of making decisions on his own behalf. 6. Skin/Wound Care: Routine pressure relief measures 7. Fluids/Electrolytes/Nutrition: Strict I/O. Daily weights. 8. T2DM: Was on actos, metformin and Januvia. Monitor BS ac/hs and continue SSI for now CBG (last 3)  Recent Labs    03/11/19 1938 03/11/19 2311 03/12/19 0321  GLUCAP 243* 117* 102*  some lability but overall improving with activity cont prednisone taper 9. 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  10. CAP:  Completed Cefepime- 11. Cough: Continue IS.  12. Gout flare: resolving on  prednisone but was still in a good deal of pain today. Observe with therapies.              -continue prednisone, tapering to off.   13. Left prostatic mass/Right renal mass: follow up with GU on outpatient basis.      LOS: 4 days A FACE TO [place] E Ryan Rice 03/12/2019, 7:34 AM

## 2019-03-12 NOTE — Progress Notes (Signed)
Occupational Therapy Session Note  Patient Details  Name: Ryan Rice MRN: [id number] Date of Birth: 1951/06/21  Today's Date: 03/12/2019 OT Individual Time: 0938-1829 OT Individual Time Calculation (min): 57 min    Short Term Goals: Week 1:  OT Short Term Goal 1 (Week 1): Pt will tolerate standing at the sink for 5 minutes within BADL task OT Short Term Goal 2 (Week 1): Pt will complete LB dressing with supervision OT Short Term Goal 3 (Week 1): Pt will complete toilet transfer with CGA  Skilled Therapeutic Interventions/Progress Updates:    Pt completed toileting, showering, grooming, and dressing during session.  Min guard for transfers in the room and functional mobility without use of the RW.  Supervision and min instructional cueing for use with the RW for support.  Close supervision to complete toilet hygiene and clothing management.  He needed max assist for removal of TEDs and for donning them after shower.  Supervision for all bathing and dressing as well sit to stand on the shower seat and from the wheelchair.  Finished session with completion of grooming tasks at setup level from the wheelchair and transfer to the bedside recliner with min assist and no assistive device.  Call button and phone in reach with chair alarm in place.  Nursing present as well.   Therapy Documentation Precautions:  Precautions Precautions: Fall Restrictions Weight Bearing Restrictions: No  Pain: Pain Assessment Pain Score: 0-No pain ADL: See Care Tool Section for some details of ADL  Therapy/Group: Individual Therapy  Narcissa Melder OTR/L 03/12/2019, 9:29 AM

## 2019-03-12 NOTE — Progress Notes (Signed)
ANTICOAGULATION CONSULT NOTE - Follow Up Consult  Pharmacy Consult for Warfarin Indication: hx of DVT  Allergies  Allergen Reactions  . Iodinated Diagnostic Agents Hives  . Tape Hives   Patient Measurements: Height: 5\' 11"  (180.3 cm) Weight: 207 lb 3.7 oz (94 kg) IBW/kg (Calculated) : 75.3  Vital Signs: Temp: 98.1 F (36.7 C) (06/16 0321) BP: 160/68 (06/16 0321) Pulse Rate: 57 (06/16 0321)  Labs: Recent Labs    03/09/19 1306 03/10/19 1028 03/11/19 0742 03/12/19 0702  HGB 11.9*  --   --   --   HCT 38.4*  --   --   --   PLT 164  --   --   --   LABPROT 25.8* 28.1* 23.3* 24.0*  INR 2.4* 2.7* 2.1* 2.2*  CREATININE 1.01 1.10 0.88 1.02   Estimated Creatinine Clearance: 81.2 mL/min (by C-G formula based on SCr of 1.02 mg/dL).  Assessment: 68 yo M transfer from outside hospital with diarrhea/dyspnea. On warfarin PTA for history of DVT. Pt was briefly on heparin gtt on 6/7, now pharmacy consulted to manage warfarin therapy.    PTA warfarin regimen: 5mg  on Wed/Sun, 2.5mg  all other days. INR was 7.4 on admit.  INR today 2.2. Hgb improved to 11.9 (6/13), Pltc improved to WNL. No bleeding reported.    Note possible drug-drug interaction with Bactrim, however Bactrim was also a PTA medication.  Goal of Therapy:  INR 2-3 Monitor platelets by anticoagulation protocol: Yes   Plan:  Warfarin 2.5 mg PO x 1 today Daily PT/INR Monitor closely for s/sx of bleeding  Kindred Heying A. Levada Dy, PharmD, [place] Please utilize Amion for appropriate phone number to reach the unit pharmacist ([place])   03/12/2019,10:52 AM

## 2019-03-13 ENCOUNTER — Inpatient Hospital Stay (HOSPITAL_COMMUNITY): Payer: Medicare HMO | Admitting: Physical Therapy

## 2019-03-13 ENCOUNTER — Inpatient Hospital Stay (HOSPITAL_COMMUNITY): Payer: Medicare HMO

## 2019-03-13 ENCOUNTER — Inpatient Hospital Stay (HOSPITAL_COMMUNITY): Payer: Medicare HMO | Admitting: Occupational Therapy

## 2019-03-13 LAB — RENAL FUNCTION PANEL
Albumin: 2.3 g/dL — ABNORMAL LOW (ref 3.5–5.0)
Anion gap: 7 (ref 5–15)
BUN: 22 mg/dL (ref 8–23)
CO2: 22 mmol/L (ref 22–32)
Calcium: 9.9 mg/dL (ref 8.9–10.3)
Chloride: 108 mmol/L (ref 98–111)
Creatinine, Ser: 0.97 mg/dL (ref 0.61–1.24)
GFR calc Af Amer: >60 mL/min (ref >60)
GFR calc non Af Amer: >60 mL/min (ref >60)
Glucose, Bld: 224 mg/dL — ABNORMAL HIGH (ref 70–99)
Phosphorus: 2 mg/dL — ABNORMAL LOW (ref 2.5–4.6)
Potassium: 4.4 mmol/L (ref 3.5–5.1)
Sodium: 137 mmol/L (ref 135–145)

## 2019-03-13 LAB — GLUCOSE, CAPILLARY
Glucose-Capillary: 169 mg/dL — ABNORMAL HIGH (ref 70–99)
Glucose-Capillary: 186 mg/dL — ABNORMAL HIGH (ref 70–99)
Glucose-Capillary: 246 mg/dL — ABNORMAL HIGH (ref 70–99)
Glucose-Capillary: 286 mg/dL — ABNORMAL HIGH (ref 70–99)

## 2019-03-13 LAB — PROTIME-INR
INR: 2 — ABNORMAL HIGH (ref 0.8–1.2)
Prothrombin Time: 22.6 seconds — ABNORMAL HIGH (ref 11.4–15.2)

## 2019-03-13 MED ORDER — MUSCLE RUB 10-15 % EX CREA
TOPICAL_CREAM | Freq: Two times a day (BID) | CUTANEOUS | Status: DC | PRN
  Filled 2019-03-13 (×2): qty 85

## 2019-03-13 MED ORDER — WARFARIN SODIUM 3 MG PO TABS
3.0000 mg | ORAL_TABLET | Freq: Once | ORAL | Status: AC
  Administered 2019-03-13: 3 mg via ORAL
  Filled 2019-03-13: qty 1

## 2019-03-13 NOTE — Progress Notes (Signed)
Ryan [facility] PHYSICAL MEDICINE & REHABILITATION PROGRESS NOTE   Subjective/Complaints:  Pt slept ok yesterday , no gout flare, reduced prednisone to 22m   ROS:  Denies CP, SOB, N/V/D  Objective:   No results found. No results for input(s): WBC, HGB, HCT, PLT in the last 72 hours. Recent Labs    03/11/19 0742 03/12/19 0702  NA 139 139  K 3.7 4.3  CL 110 112*  CO2 22 21*  GLUCOSE 134* 101*  BUN 19 22  CREATININE 0.88 1.02  CALCIUM 9.4 9.7    Intake/Output Summary (Last 24 hours) at 03/13/2019 0803 Last data filed at 03/13/2019 06387Gross per 24 hour  Intake 240 ml  Output 2976 ml  Net -2736 ml     Physical Exam: Vital Signs Blood pressure (!) 138/50, pulse (!) 56, temperature 98.3 F (36.8 C), temperature source Oral, resp. rate 16, height 5' 11"  (1.803 m), weight 94.2 kg, SpO2 97 %.   General: No acute distress Mood and affect are appropriate Eyes Left subconjunctival hemorrhage, no drainage, able to read print with each eye denies blurriness on Left side  Heart: Regular rate and rhythm no rubs murmurs or extra sounds Lungs: Clear to auscultation, breathing unlabored, no rales or wheezes Abdomen: Positive bowel sounds, soft nontender to palpation, nondistended Extremities: No clubbing, cyanosis, or edema Skin: No evidence of breakdown, no evidence of rash Neurologic: Cranial nerves II through XII intact, motor strength is 4/5 in bilateral deltoid, bicep, tricep, grip, hip flexor, knee extensors, ankle dorsiflexor and plantar flexor Sensory exam normal sensation to light touch and proprioception in bilateral upper and lower extremities  Musculoskeletal: Full range of motion in all 4 extremities. No joint swelling   Assessment/Plan: 1. Functional deficits secondary to Debility following sepsis which require 3+ hours per day of interdisciplinary therapy in a comprehensive inpatient rehab setting.  Physiatrist is providing close team supervision and 24 hour  management of active medical problems listed below.  Physiatrist and rehab team continue to assess barriers to discharge/monitor patient progress toward functional and medical goals  Care Tool:  Bathing    Body parts bathed by patient: Right arm, Left arm, Chest, Abdomen, Front perineal area, Left upper leg, Right upper leg, Right lower leg, Left lower leg, Face, Buttocks   Body parts bathed by helper: Buttocks     Bathing assist Assist Level: Supervision/Verbal cueing     Upper Body Dressing/Undressing Upper body dressing   What is the patient wearing?: Pull over shirt    Upper body assist Assist Level: Set up assist    Lower Body Dressing/Undressing Lower body dressing      What is the patient wearing?: Pants, Underwear/pull up     Lower body assist Assist for lower body dressing: Supervision/Verbal cueing     Toileting Toileting    Toileting assist Assist for toileting: Supervision/Verbal cueing Assistive Device Comment: urinal   Transfers Chair/bed transfer  Transfers assist     Chair/bed transfer assist level: Supervision/Verbal cueing     Locomotion Ambulation   Ambulation assist      Assist level: Supervision/Verbal cueing Assistive device: Walker-rolling Max distance: 150 ft   Walk 10 feet activity   Assist     Assist level: Contact Guard/Touching assist Assistive device: Walker-rolling   Walk 50 feet activity   Assist    Assist level: Contact Guard/Touching assist Assistive device: Walker-rolling    Walk 150 feet activity   Assist Walk 150 feet activity did not occur: Safety/medical concerns  Assist level: Contact Guard/Touching assist Assistive device: Walker-rolling    Walk 10 feet on uneven surface  activity   Assist     Assist level: Minimal Assistance - Patient > 75%(up/down ramp) Assistive device: Aeronautical engineer Will patient use wheelchair at discharge?: (TBD but anticipate will  not use w/c)             Wheelchair 50 feet with 2 turns activity    Assist            Wheelchair 150 feet activity     Assist          Medical Problem List and Plan: 1.  Functional and mobility deficits secondary to sepsis, gout and multiple medical considerations. Resolving encephalopathy             -CIR PT, OT   Team conference today please see physician documentation under team conference tab, met with team face-to-face to discuss problems,progress, and goals. Formulized individual treatment plan based on medical history, underlying problem and comorbidities. 2.  Antithrombotics: -DVT/anticoagulation:  Pharmaceutical: Coumadin pharm protocol            -antiplatelet therapy: N/A 3. Pain Management: tylenol prn 4. Mood: LCSW to follow for evaluation and support.              -antipsychotic agents: N/A 5. Neuropsych: This patient is capable of making decisions on his own behalf. 6. Skin/Wound Care: Routine pressure relief measures 7. Fluids/Electrolytes/Nutrition: Strict I/O. Daily weights. 8. T2DM: Was on actos, metformin and Januvia. Monitor BS ac/hs and continue SSI for now CBG (last 3)  Recent Labs    03/12/19 1630 03/12/19 2138 03/13/19 0629  GLUCAP 267* 259* 169*  some lability but overall improving with activity cont prednisone taper 9. Acute on chronic renal disease s/p renal transplant:on immunosuppressants Renal failure resolved.  10. CAP:  Completed Cefepime- 11. Cough: Continue IS.  12. Gout flare: resolving on  prednisone but was still in a good deal of pain today. Observe with therapies.              -continue prednisone, tapering to off.   13. Left prostatic mass/Right renal mass: follow up with GU on outpatient basis.      LOS: 5 days A FACE TO FACE EVALUATION WAS PERFORMED  Charlett Blake 03/13/2019, 8:03 AM

## 2019-03-13 NOTE — Progress Notes (Signed)
Occupational Therapy Session Note  Patient Details  Name: Isaia Hassell MRN: [id number] Date of Birth: 01/24/1951  Today's Date: 03/13/2019 OT Individual Time: 6122-4497 OT Individual Time Calculation (min): 60 min    Short Term Goals: Week 1:  OT Short Term Goal 1 (Week 1): Pt will tolerate standing at the sink for 5 minutes within BADL task OT Short Term Goal 2 (Week 1): Pt will complete LB dressing with supervision OT Short Term Goal 3 (Week 1): Pt will complete toilet transfer with CGA  Skilled Therapeutic Interventions/Progress Updates:    Pt completed toileting, showering, dressing, and grooming tasks during session.  He was able to complete toilet and shower transfers with use of the RW and supervision.  All bathing and dressing completed at supervision level sit to stand as well.  Min instructional cueing for making sure to lock the wheelchair brakes when working on dressing tasks.  He was able to complete grooming tasks sit to stand with supervision as well.  Finished session with pt in the wheelchair with call button and phone in reach with safety belt in place.    Therapy Documentation Precautions:  Precautions Precautions: Fall Restrictions Weight Bearing Restrictions: No  Pain: Pain Assessment Pain Scale: Faces Pain Score: 0-No pain ADL: See Care Tool Section for some details of ADL  Therapy/Group: Individual Therapy  Jhalil Silvera OTR/L 03/13/2019, 11:14 AM

## 2019-03-13 NOTE — Progress Notes (Signed)
Physical Therapy Session Note  Patient Details  Name: Ryan Rice MRN: [id number] Date of Birth: 12-20-1950  Today's Date: 03/13/2019 PT Individual Time: 1455-1605 PT Individual Time Calculation (min): 70 min   Short Term Goals: Week 1:  PT Short Term Goal 1 (Week 1): Patient will perform bed<>chair transfers with supervision PT Short Term Goal 2 (Week 1): Patient will ambulate at least 123ft using LRAD with no more than CGA PT Short Term Goal 3 (Week 1): Patient will ascend/descend 4 steps using handrails with no more than CGA  Skilled Therapeutic Interventions/Progress Updates:  Pt sitting in recliner.  Pain low back 5/10; has small heating pack on it; declined meds.    Seated Therapeutic exercise performed with LE to increase strength for functional mobility:  15 x 1 bil adductor squeezes, 10 x 1 : bil UE rowing using orange Theraband, bil heel raises, bil glut sets, R/L long arc quad knee extensions with ankle pumps at end range, heel raises, toe raises.   Standing with bil UE support on foot board of bed, 15 x 1 mini squats, 5 x 1 R/L hip abduction.   PT educated pt about improving core strength to decrease low back pain.    Pt noted to have moderate edema bil, L>R.  TEDS were doubled up at popliteal area.  PT educated pt on proper donning of stockings, with extra material tucked under toes, not at knees, as well as elevating bil LEs in the recliner, and performing ankle pumps to tolerance.  PT cut elastic of non slip socks, which had left constriction bil lower legs.   Pt stated that he needed to urinate; gait into BR with RW.  Pt stood to urinate at toilet, supervision.   Gait training with RW on level tile in hallway with RW, supervision, x 150' including turns.  In standing, given external perturbations, pt demonstrated bil ankle and hip strategies, but low back pain increased.    At end of session, pt left resting in bed with alarm set and needs at hand.      Therapy  Documentation Precautions:  Precautions Precautions: Fall Restrictions Weight Bearing Restrictions: No       Therapy/Group: Individual Therapy  Ryan Rice 03/13/2019, 4:27 PM

## 2019-03-13 NOTE — Progress Notes (Signed)
Occupational Therapy Session Note  Patient Details  Name: Shan Valdes MRN: [id number] Date of Birth: 29-Jan-1951  Today's Date: 03/13/2019 OT Individual Time: 1300-1330 OT Individual Time Calculation (min): 30 min    Short Term Goals: Week 1:  OT Short Term Goal 1 (Week 1): Pt will tolerate standing at the sink for 5 minutes within BADL task OT Short Term Goal 2 (Week 1): Pt will complete LB dressing with supervision OT Short Term Goal 3 (Week 1): Pt will complete toilet transfer with CGA   Skilled Therapeutic Interventions/Progress Updates:    Session focused on dynamic standing balance and BUE strengthening. Pt received in w/c with c/o back soreness, heat pack provided at end of session for pain management. Pt completed standing level toileting, ambulating into bathroom with close (S). Pt then completed w/c propulsion 100 ft to [employer] room. Pt stood with RW and alternated R and L functional reaching to board without UE support on walker. No LOB, pt required seated rest break following 1 min of standing. Pt returned to room and was left in recliner with heat pack in place and all needs within reach.   Therapy Documentation Precautions:  Precautions Precautions: Fall Restrictions Weight Bearing Restrictions: No  Therapy/Group: Individual Therapy  Curtis Sites 03/13/2019, 1:53 PM

## 2019-03-13 NOTE — Progress Notes (Signed)
ANTICOAGULATION CONSULT NOTE - Follow Up Consult  Pharmacy Consult for Warfarin Indication: hx of DVT  Allergies  Allergen Reactions  . Iodinated Diagnostic Agents Hives  . Tape Hives   Patient Measurements: Height: 5\' 11"  (180.3 cm) Weight: 207 lb 11.1 oz (94.2 kg) IBW/kg (Calculated) : 75.3  Vital Signs: Temp: 98.3 F (36.8 C) (06/17 0547) Temp Source: Oral (06/17 0547) BP: 138/50 (06/17 0547) Pulse Rate: 56 (06/17 0547)  Labs: Recent Labs    03/11/19 0742 03/12/19 0702 03/13/19 0755  LABPROT 23.3* 24.0* 22.6*  INR 2.1* 2.2* 2.0*  CREATININE 0.88 1.02 0.97   Estimated Creatinine Clearance: 85.5 mL/min (by C-G formula based on SCr of 0.97 mg/dL).  Assessment: 68 yo M transfer from outside hospital with diarrhea/dyspnea. On warfarin PTA for history of DVT. Pt was briefly on heparin gtt on 6/7, now pharmacy consulted to manage warfarin therapy.    PTA warfarin regimen: 5mg  on Wed/Sun, 2.5mg  all other days. INR was 7.4 on admit.  INR today 2. Hgb improved to 11.9 (6/13), Pltc improved to WNL. No bleeding reported.    Note possible drug-drug interaction with Bactrim, however Bactrim was also a PTA medication.  Goal of Therapy:  INR 2-3 Monitor platelets by anticoagulation protocol: Yes   Plan:  Warfarin 3 mg PO x 1 today Daily PT/INR Monitor closely for s/sx of bleeding  Cici Rodriges A. Levada Dy, PharmD, [place] Please utilize Amion for appropriate phone number to reach the unit pharmacist ([place])   03/13/2019,8:52 AM

## 2019-03-13 NOTE — Progress Notes (Signed)
Physical Therapy Session Note  Patient Details  Name: Ryan Rice MRN: [id number] Date of Birth: 06-08-1951  Today's Date: 03/13/2019 PT Individual Time: 1130-1200 PT Individual Time Calculation (min): 30 min   Short Term Goals: Week 1:  PT Short Term Goal 1 (Week 1): Patient will perform bed<>chair transfers with supervision PT Short Term Goal 2 (Week 1): Patient will ambulate at least 149f using LRAD with no more than CGA PT Short Term Goal 3 (Week 1): Patient will ascend/descend 4 steps using handrails with no more than CGA  Skilled Therapeutic Interventions/Progress Updates:   Pt received sitting in WC and agreeable to PT. PT instructed pt in gait training through rehab unit 2 x 1232fwith supervision assist and RW. Min cues with gait for posture and step height on the LLE. PT instructed pt in dynamic standing balance/tolerance while engaged in wii bowling. 3 bouts for standing with 1 UE support for ~3 min each. LPB limits increased tolerance and standing at this time. Throughout treatment, pt able to complete sit<>stand with supervision assist with BUE push form arm rest x 5. [facility] returned to [facility] and [facility] reach and all needs met.         Therapy Documentation Precautions:  Precautions Precautions: Fall Restrictions Weight Bearing Restrictions: No   Pain: Pain Assessment Pain Scale: Faces Pain Score: 0-No pain at rest     Therapy/Group: Individual Therapy  AuLorie Phenix/17/2020, 12:08 PM

## 2019-03-14 ENCOUNTER — Inpatient Hospital Stay (HOSPITAL_COMMUNITY): Payer: Medicare HMO | Admitting: Physical Therapy

## 2019-03-14 ENCOUNTER — Inpatient Hospital Stay (HOSPITAL_COMMUNITY): Payer: Medicare HMO | Admitting: Occupational Therapy

## 2019-03-14 LAB — PROTIME-INR
INR: 1.9 — ABNORMAL HIGH (ref 0.8–1.2)
Prothrombin Time: 21.9 seconds — ABNORMAL HIGH (ref 11.4–15.2)

## 2019-03-14 LAB — RENAL FUNCTION PANEL
Albumin: 2.3 g/dL — ABNORMAL LOW (ref 3.5–5.0)
Anion gap: 6 (ref 5–15)
BUN: 22 mg/dL (ref 8–23)
CO2: 23 mmol/L (ref 22–32)
Calcium: 9.9 mg/dL (ref 8.9–10.3)
Chloride: 108 mmol/L (ref 98–111)
Creatinine, Ser: 1.22 mg/dL (ref 0.61–1.24)
GFR calc Af Amer: >60 mL/min (ref >60)
GFR calc non Af Amer: >60 mL/min (ref >60)
Glucose, Bld: 247 mg/dL — ABNORMAL HIGH (ref 70–99)
Phosphorus: 2.4 mg/dL — ABNORMAL LOW (ref 2.5–4.6)
Potassium: 4.5 mmol/L (ref 3.5–5.1)
Sodium: 137 mmol/L (ref 135–145)

## 2019-03-14 LAB — GLUCOSE, CAPILLARY
Glucose-Capillary: 169 mg/dL — ABNORMAL HIGH (ref 70–99)
Glucose-Capillary: 212 mg/dL — ABNORMAL HIGH (ref 70–99)
Glucose-Capillary: 372 mg/dL — ABNORMAL HIGH (ref 70–99)
Glucose-Capillary: 233 mg/dL — ABNORMAL HIGH (ref 70–99)

## 2019-03-14 MED ORDER — WARFARIN SODIUM 5 MG PO TABS
5.0000 mg | ORAL_TABLET | Freq: Once | ORAL | Status: AC
  Administered 2019-03-14: 18:00:00 5 mg via ORAL
  Filled 2019-03-14: qty 1

## 2019-03-14 NOTE — Progress Notes (Signed)
ANTICOAGULATION CONSULT NOTE - Follow Up Consult  Pharmacy Consult for Warfarin Indication: hx of DVT  Allergies  Allergen Reactions  . Iodinated Diagnostic Agents Hives  . Tape Hives   Patient Measurements: Height: 5\' 11"  (180.3 cm) Weight: 207 lb 11.1 oz (94.2 kg) IBW/kg (Calculated) : 75.3  Vital Signs: Temp: 98.5 F (36.9 C) (06/18 0508) BP: 164/74 (06/18 0508) Pulse Rate: 55 (06/18 0508)  Labs: Recent Labs    03/12/19 0702 03/13/19 0755 03/14/19 0800  LABPROT 24.0* 22.6* 21.9*  INR 2.2* 2.0* 1.9*  CREATININE 1.02 0.97 1.22   Estimated Creatinine Clearance: 68 mL/min (by C-G formula based on SCr of 1.22 mg/dL).  Assessment: 68 yo M transfer from outside hospital with diarrhea/dyspnea. On warfarin PTA for history of DVT. Pt was briefly on heparin gtt on 6/7, now pharmacy consulted to manage warfarin therapy.    PTA warfarin regimen: 5mg  on Wed/Sun, 2.5mg  all other days. INR was 7.4 on admit.  INR today 1.9. Hgb improved to 11.9 (6/13), Pltc improved to WNL. No bleeding reported.    Note possible drug-drug interaction with Bactrim, however Bactrim was also a PTA medication.  Goal of Therapy:  INR 2-3 Monitor platelets by anticoagulation protocol: Yes   Plan:  Warfarin 5 mg PO x 1 today Daily PT/INR Monitor closely for s/sx of bleeding  Gaylen Venning A. Levada Dy, PharmD, [place] Please utilize Amion for appropriate phone number to reach the unit pharmacist ([place])   03/14/2019,9:50 AM

## 2019-03-14 NOTE — Progress Notes (Signed)
Occupational Therapy Session Note  Patient Details  Name: Ryan Rice MRN: [id number] Date of Birth: Jan 29, 1951  Today's Date: 03/14/2019 OT Individual Time: 8889-1694 OT Individual Time Calculation (min): 59 min    Short Term Goals: Week 1:  OT Short Term Goal 1 (Week 1): Pt will tolerate standing at the sink for 5 minutes within BADL task OT Short Term Goal 2 (Week 1): Pt will complete LB dressing with supervision OT Short Term Goal 3 (Week 1): Pt will complete toilet transfer with CGA  Skilled Therapeutic Interventions/Progress Updates:    Pt completed bathing and dressing during session.  Supervision for functional mobility with use of the RW for support to the bathroom and the shower.  He was able to complete all toileting with supervision as well as bathing sit to stand.  He was able to transfer out to the wheelchair for dressing and grooming tasks at supervision level.  Finished session with pt in the wheelchair and safety alarm belt in place and call button and phone in reach.   Therapy Documentation Precautions:  Precautions Precautions: Fall Restrictions Weight Bearing Restrictions: No  Pain: Pain Assessment Faces Pain Scale: Hurts a [place] bit ADL: See Care Tool Section for some details of ADL  Therapy/Group: Individual Therapy  Veida Spira OTR/L 03/14/2019, 2:16 PM

## 2019-03-14 NOTE — Progress Notes (Signed)
Occupational Therapy Session Note  Patient Details  Name: Zymier Rodgers MRN: [id number] Date of Birth: [birth year]  Today's Date: 03/14/2019 OT Individual Time: 1445-1530 OT Individual Time Calculation (min): 45 min    Short Term Goals: Week 1:  OT Short Term Goal 1 (Week 1): Pt will tolerate standing at the sink for 5 minutes within BADL task OT Short Term Goal 2 (Week 1): Pt will complete LB dressing with supervision OT Short Term Goal 3 (Week 1): Pt will complete toilet transfer with CGA  Skilled Therapeutic Interventions/Progress Updates:    Pt greeted sitting in wc and agreeable to OT treatment session focused on UB there-ex. Pt brought to therapy gym in wc. Pt completed 3 sets of 10 bicep curls, straight arm raises, and upright rows. Pt tolerated standing for each exercise, but rested in sitting in between. Continued UB there-ex with seated chest press ball toss 3 sets of 20. Pt ambulated back to room at end of session w/ RW and supervision. Pt left seated in wc with alarm belt on and needs met.   Therapy Documentation Precautions:  Precautions Precautions: Fall Restrictions Weight Bearing Restrictions: No Pain: Denies pain   Therapy/Group: Individual Therapy  Valma Cava 03/14/2019, 3:01 PM

## 2019-03-14 NOTE — Progress Notes (Signed)
[place] PHYSICAL MEDICINE & REHABILITATION PROGRESS NOTE   Subjective/Complaints:  Reviewed x-rays.  No fractures or evidence of spondylolisthesis.  Multilevel degenerative changes as expected  ROS:  Denies CP, SOB, N/V/D  Objective:   Dg Lumbar Spine 2-3 Views  Result Date: 03/13/2019 CLINICAL DATA:  Low back pain with no known injury. EXAM: LUMBAR SPINE - 2-3 VIEW COMPARISON:  CT dated 02/27/2019 FINDINGS: There is no displaced fracture. Multilevel degenerative changes are noted throughout the lumbar spine with multilevel disc height loss and multilevel facet arthrosis. Atherosclerotic changes are noted of the abdominal aorta. IMPRESSION: 1. No acute osseous abnormality. 2. Multilevel degenerative changes are noted of the lumbar spine. These were better visualized on the patient's recent CT dated 02/27/2019. Electronically Signed   By: Constance Holster M.D.   On: 03/13/2019 14:37   No results for input(s): WBC, HGB, HCT, PLT in the last 72 hours. Recent Labs    03/12/19 0702 03/13/19 0755  NA 139 137  K 4.3 4.4  CL 112* 108  CO2 21* 22  GLUCOSE 101* 224*  BUN 22 22  CREATININE 1.02 0.97  CALCIUM 9.7 9.9    Intake/Output Summary (Last 24 hours) at 03/14/2019 0709 Last data filed at 03/14/2019 0500 Gross per 24 hour  Intake 240 ml  Output 2200 ml  Net -1960 ml     Physical Exam: Vital Signs Blood pressure (!) 164/74, pulse (!) 55, temperature 98.5 F (36.9 C), resp. rate 18, height 5\' 11"  (1.803 m), weight 94.2 kg, SpO2 100 %.   General: No acute distress Mood and affect are appropriate Eyes Left subconjunctival hemorrhage, no drainage, able to read print with each eye denies blurriness on Left side  Heart: Regular rate and rhythm no rubs murmurs or extra sounds Lungs: Clear to auscultation, breathing unlabored, no rales or wheezes Abdomen: Positive bowel sounds, soft nontender to palpation, nondistended Extremities: No clubbing, cyanosis, or edema Skin: No  evidence of breakdown, no evidence of rash Neurologic: Cranial nerves II through XII intact, motor strength is 4/5 in bilateral deltoid, bicep, tricep, grip, hip flexor, knee extensors, ankle dorsiflexor and plantar flexor Sensory exam normal sensation to light touch and proprioception in bilateral upper and lower extremities  Musculoskeletal: Full range of motion in all 4 extremities. No joint swelling   Assessment/Plan: 1. Functional deficits secondary to Debility following sepsis which require 3+ hours per day of interdisciplinary therapy in a comprehensive inpatient rehab setting.  Physiatrist is providing close team supervision and 24 hour management of active medical problems listed below.  Physiatrist and rehab team continue to assess barriers to discharge/monitor patient progress toward functional and medical goals  Care Tool:  Bathing    Body parts bathed by patient: Right arm, Left arm, Chest, Abdomen, Front perineal area, Left upper leg, Right upper leg, Right lower leg, Left lower leg, Face, Buttocks   Body parts bathed by helper: Buttocks     Bathing assist Assist Level: Supervision/Verbal cueing     Upper Body Dressing/Undressing Upper body dressing   What is the patient wearing?: Pull over shirt    Upper body assist Assist Level: Set up assist    Lower Body Dressing/Undressing Lower body dressing      What is the patient wearing?: Pants, Underwear/pull up     Lower body assist Assist for lower body dressing: Supervision/Verbal cueing     Toileting Toileting    Toileting assist Assist for toileting: Supervision/Verbal cueing Assistive Device Comment: urinal   Transfers Chair/bed  transfer  Transfers assist     Chair/bed transfer assist level: Supervision/Verbal cueing     Locomotion Ambulation   Ambulation assist      Assist level: Supervision/Verbal cueing Assistive device: Walker-rolling Max distance: 150   Walk 10 feet  activity   Assist     Assist level: Supervision/Verbal cueing Assistive device: Walker-rolling   Walk 50 feet activity   Assist    Assist level: Supervision/Verbal cueing Assistive device: Walker-rolling    Walk 150 feet activity   Assist Walk 150 feet activity did not occur: Safety/medical concerns  Assist level: Supervision/Verbal cueing Assistive device: Walker-rolling    Walk 10 feet on uneven surface  activity   Assist     Assist level: Minimal Assistance - Patient > 75%(up/down ramp) Assistive device: Aeronautical engineer Will patient use wheelchair at discharge?: (TBD but anticipate will not use w/c)             Wheelchair 50 feet with 2 turns activity    Assist            Wheelchair 150 feet activity     Assist          Medical Problem List and Plan: 1.  Functional and mobility deficits secondary to sepsis, gout and multiple medical considerations. Resolving encephalopathy             -CIR PT, OT   2.  Antithrombotics: -DVT/anticoagulation:  Pharmaceutical: Coumadin pharm protocol            -antiplatelet therapy: N/A 3. Pain Management: tylenol prn 4. Mood: LCSW to follow for evaluation and support.              -antipsychotic agents: N/A 5. Neuropsych: This patient is capable of making decisions on his own behalf. 6. Skin/Wound Care: Routine pressure relief measures 7. Fluids/Electrolytes/Nutrition: Strict I/O. Daily weights. Poor intake of fluid  8. T2DM: Was on actos, metformin and Januvia. Monitor BS ac/hs and continue SSI for now CBG (last 3)  Recent Labs    03/13/19 1635 03/13/19 2121 03/14/19 0621  GLUCAP 246* 286* 169*  some lability but overall improving with activity now on home dose prednisone 9. 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  Renal failure resolved.  10. CAP:  Completed Cefepime- 11. Cough: Continue  IS.  12. Gout flare: resolving on  prednisone but was still in a good deal of pain today. Observe with therapies.              -continue prednisone at current home dose  13. Left prostatic mass/Right renal mass: follow up with GU on outpatient basis.      LOS: 6 days A FACE TO FACE EVALUATION WAS PERFORMED  Charlett Blake 03/14/2019, 7:09 AM

## 2019-03-14 NOTE — Progress Notes (Signed)
Physical Therapy Session Note  Patient Details  Name: Ryan Rice MRN: [id number] Date of Birth: 11/23/1950  Today's Date: 03/14/2019 PT Individual Time: 6063-0160 and 1700-1744 PT Individual Time Calculation (min): 71 min and 44 min  Short Term Goals: Week 1:  PT Short Term Goal 1 (Week 1): Patient will perform bed<>chair transfers with supervision PT Short Term Goal 2 (Week 1): Patient will ambulate at least 160ft using LRAD with no more than CGA PT Short Term Goal 3 (Week 1): Patient will ascend/descend 4 steps using handrails with no more than CGA  Skilled Therapeutic Interventions/Progress Updates:    Session 1: Pt received supine in bed and agreeable to therapy session. Supine>sit, HOB partially elevated, with supervision. Sit<>stand EOB/EOM<>RW with close supervision for safety throughout session. Ambulated ~1ft x2 to/from bathroom using RW with close supervision. Sit<>stand toilet<>RW with close supervision. Performed LB clothing management and peri-care with supervision for safety during standing balance. Continent of BM and bladder. Ambulated ~148ft using RW to therapy gym with close supervision for safety - demonstrates decreased gait speed with cuing for increased upright posture. Stepped up/down 4" height step x2 using RW to replicate home environment with CGA for steadying/safety and cuing for proper AD management.  Performed the following exercises and created HEP; performed 2x10 repetitions of each exercise: - repeated sit<>stand from EOM, no UE support - cuing for proper form and education on performing this in a heavy/sturdy chair at home - supine bridging 2x10 with cuing for increased TA abdominal muscle activation with pt demonstrating poor motor recruitment of TA - supine alternate B LE marching with focus on TA abdominal muscle activation with pt continuing to demonstrate inconsistent muscle recruitment - educated on performing isometric TA muscle activation in room to  improve muscle contraction - mod cuing throughout for technique/form - standing hip abduction at parallel bars with B UE support and mirror feedback with mod cuing for proper technique/form - standing B LE heel raises with BUE support on RW - pt demonstrated heavy reliance on B UE support  Patient educated to stop the exercises if he experiences pain. Provided pt printed HEP of the above exercises. Pt educated on importance of ambulating using RW inside home daily for overall health/wellness as well as education regarding fall risk safety. Ambulated ~168ft using RW back to room with close supervision for safety. Pt left sitting in w/c with needs in reach and seat belt alarm on.   Session 2: Pt received sitting in w/c with his wife present for family education. Pt/wife educated on OT's recommendation for 3in1 BSC at home to be used over toilet and in shower as a shower chair. Therapist demonstrated proper technique for pt to step in/out of shower using RW per OTs demonstration to PT. Pt's wife educated that per OT pt is at supervision level for all ADLs except needing assistance for donning/doffing TED hose. Pt performed sit<>stand from various surfaces to RW with supervision throughout session. Ambulated ~171ft using RW with supervision and pt's wife educated on providing close supervision for safety in the home. Pt performed stepping up/down 4" step using RW x2 with pt demonstrating proper technique with min cuing for carryover of education from this AM regarding getting closer to step with his feet prior to moving RW up/down the step - supervision throughout and education regarding family's position to provide supervision for pt safety. After seated rest break, ambulated ~165ft to car room using RW with supervision. Performed ambulatory car transfer using RW (SUV height to  replicate family vehicle) with close supervision from wife for safety and min cuing on proper sequencing of transfer. Ambulated ~67ft  up/down ramp using RW x2 with therapist providing supervision first trial and then pt's wife providing supervision on 2nd trial. Pt's wife demonstrates good safety awareness throughout session and proper supervision for patient safety. Pt/wife educated on follow-up therapy recommendations, HEP to be performed at home including an indoor walking program using RW, and fall risk safety in the home. Pt ambulated ~317ft back to room using RW with supervision - pt demonstrating increased fatigue towards end of walk with cuing for improved AD proximity and upright posture despite fatigue. Pt/wife report no concerns nor questions at end of session. Pt left sitting in w/c with seat belt alarm on, needs in reach, and meal tray set-up.  Therapy Documentation Precautions:  Precautions Precautions: Fall Restrictions Weight Bearing Restrictions: No  Pain:   Session 1: Reports "a [place] bit in the back" at beginning of session and denies increased pain during session.  Session 2: Reports a [place] bit of back pain with therapist providing heating pad at end of session.   Therapy/Group: Individual Therapy  Tawana Scale, PT, DPT 03/14/2019, 7:54 AM

## 2019-03-15 ENCOUNTER — Inpatient Hospital Stay (HOSPITAL_COMMUNITY): Payer: Medicare HMO | Admitting: Occupational Therapy

## 2019-03-15 ENCOUNTER — Inpatient Hospital Stay (HOSPITAL_COMMUNITY): Payer: Medicare HMO | Admitting: Physical Therapy

## 2019-03-15 DIAGNOSIS — H1132 Conjunctival hemorrhage, left eye: Secondary | ICD-10-CM

## 2019-03-15 DIAGNOSIS — M109 Gout, unspecified: Secondary | ICD-10-CM

## 2019-03-15 DIAGNOSIS — E119 Type 2 diabetes mellitus without complications: Secondary | ICD-10-CM

## 2019-03-15 DIAGNOSIS — H409 Unspecified glaucoma: Secondary | ICD-10-CM

## 2019-03-15 LAB — RENAL FUNCTION PANEL
Albumin: 2.4 g/dL — ABNORMAL LOW (ref 3.5–5.0)
Anion gap: 7 (ref 5–15)
BUN: 24 mg/dL — ABNORMAL HIGH (ref 8–23)
CO2: 22 mmol/L (ref 22–32)
Calcium: 10.1 mg/dL (ref 8.9–10.3)
Chloride: 109 mmol/L (ref 98–111)
Creatinine, Ser: 1.16 mg/dL (ref 0.61–1.24)
GFR calc Af Amer: >60 mL/min (ref >60)
GFR calc non Af Amer: >60 mL/min (ref >60)
Glucose, Bld: 193 mg/dL — ABNORMAL HIGH (ref 70–99)
Phosphorus: 2.6 mg/dL (ref 2.5–4.6)
Potassium: 4.4 mmol/L (ref 3.5–5.1)
Sodium: 138 mmol/L (ref 135–145)

## 2019-03-15 LAB — PROTIME-INR
INR: 2 — ABNORMAL HIGH (ref 0.8–1.2)
Prothrombin Time: 22.3 seconds — ABNORMAL HIGH (ref 11.4–15.2)

## 2019-03-15 LAB — GLUCOSE, CAPILLARY
Glucose-Capillary: 175 mg/dL — ABNORMAL HIGH (ref 70–99)
Glucose-Capillary: 220 mg/dL — ABNORMAL HIGH (ref 70–99)
Glucose-Capillary: 266 mg/dL — ABNORMAL HIGH (ref 70–99)
Glucose-Capillary: 198 mg/dL — ABNORMAL HIGH (ref 70–99)

## 2019-03-15 MED ORDER — METFORMIN HCL 500 MG PO TABS: 500.0000 mg | ORAL_TABLET | Freq: Two times a day (BID) | ORAL | 1 refills | Status: DC

## 2019-03-15 MED ORDER — METFORMIN HCL 500 MG PO TABS
500.0000 mg | ORAL_TABLET | Freq: Two times a day (BID) | ORAL | Status: DC
  Administered 2019-03-15 – 2019-03-16 (×3): 500 mg via ORAL
  Filled 2019-03-15 (×3): qty 1

## 2019-03-15 MED ORDER — FOLIC ACID 1 MG PO TABS: 1.0000 mg | ORAL_TABLET | Freq: Every day | ORAL | 1 refills | Status: DC

## 2019-03-15 MED ORDER — WARFARIN SODIUM 2.5 MG PO TABS
2.5000 mg | ORAL_TABLET | Freq: Once | ORAL | Status: AC
  Administered 2019-03-15: 2.5 mg via ORAL
  Filled 2019-03-15: qty 1

## 2019-03-15 MED ORDER — ATORVASTATIN CALCIUM 10 MG PO TABS: 10.0000 mg | ORAL_TABLET | Freq: Every day | ORAL | 1 refills | Status: DC

## 2019-03-15 MED ORDER — KETOTIFEN FUMARATE 0.025 % OP SOLN: 1.0000 [drp] | Freq: Two times a day (BID) | OPHTHALMIC | 0 refills | Status: DC

## 2019-03-15 MED ORDER — FAMOTIDINE 20 MG PO TABS: 20.0000 mg | ORAL_TABLET | Freq: Every day | ORAL | 1 refills | Status: DC

## 2019-03-15 MED ORDER — ARTIFICIAL TEARS OPHTHALMIC OINT: TOPICAL_OINTMENT | Freq: Every evening | OPHTHALMIC | 1 refills | Status: DC | PRN

## 2019-03-15 NOTE — Progress Notes (Signed)
Physical Therapy Discharge Summary  Patient Details  Name: Ryan Rice MRN: [id number] Date of Birth: 09-21-1951  Today's Date: 03/15/2019 PT Individual Time: 8416-6063 and 0160-1093 PT Individual Time Calculation (min): 76 min and 39mn   Patient has met 8 of 8 long term goals due to improved activity tolerance, improved balance, improved postural control, increased strength and decreased pain.  Patient to discharge at an ambulatory level Supervision.   Patient's wife attended in-person family education/training and is independent to provide the necessary physical assistance at discharge.  All goals met.  Recommendation:  Patient will benefit from ongoing skilled PT services in home health setting to continue to advance safe functional mobility, address ongoing impairments in B LE strength, standing balance, gait training with LRAD, stair navigation, higher level gait training on unlevel surfaces, and minimize fall risk.  Equipment: RW  Reasons for discharge: treatment goals met and discharge from hospital  Patient/family agrees with progress made and goals achieved: Yes  Skilled Therapeutic Interventions/Progress Updates:  Session 1: Patient received supine in bed and reports he has not yet received his breakfast but he has already told the nutrition services - within a few minutes the patient's breakfast arrived and pt requesting to eat prior to starting OOB mobility. Therapist reinforced prior education regarding: walking program with safety in the home, HEP, follow-up therapy recommendations, using RW for standing/ambulation, and fall risk safety in the home. Therapist provided pt with RW bag and educated on use at home. Supine>sit independently without bed features. RN present for medication administration. Sit<>stand from various surfaces using RW mod-I throughout session. Ambulated ~161fusing RW to bathroom mod-I. Sit<>stand on toilet using RW mod-I. Performed LB clothing  management and peri-care mod-I. Continent of bladder and bowels. Ambulated ~23f71fsing RW to sink mod-I. Noted pt's clothing to be soiled with pt reporting he accidentally spilt the urinal on himself. Pt cleaned up sitting in w/c a sink and performed UB and LB clothing management with set-up assistance and min assist for threading LEs into pants for time management. Ambulated ~170f1fing RW with supervision for safety - pt demonstrates good AD management and corrects upright posture without cuing. Ambulated ~145ft55fcar room using RW with supervision. Performed ambulatory car transfer using RW with supervision for safety and min cuing for proper technique. Ambulated up/down ramp using RW with supervision. Ambulated ~10ft 91fover mulch, no AD, with CGA for steadying and pt demonstrating increased unsteadiness, poor foot clearance, and decreased B LE step length - reinforced education on ambulating in the home and using RW at discharge. Stepped up/down 1 step using handrail with CGA for steadying. Ambulated ~300ft b35fto room using RW with supervision and pt again demonstrates increased fatigue towards end of ambulation requiring min cuing for improved upright posture and closer AD management. Pt left sitting in w/c with needs in reach and seat belt alarm on. Spoke with OT and nursing staff and pt made mod-I in room.   Session 2: Pt received sitting in w/c and agreeable to therapy session. Sit<>stand using RW from various surfaces mod-I throughout session. Ambulated ~150ft [id number] to therapy gym with supervision for longer distance ambulation for safety. Extensive pt education on fall risk at home and calling 911 as well as visual demonstration and education on performing floor transfers. Performed floor transfer with mod assist for lifting hips onto EOM due to impaired B LE strength and impaired LE flexibility to achieve half-kneeling position. Ascended/descended 12 steps using B HRs with  reciprocal gait  pattern and  close supervision for safety. Stepped up/down 1 step (4" height) using RW with supervision and pt demonstrating ability to perform with proper technique without cuing. Pt demonstrated ability to pick up object from floor with close supervision for safety. Ambulated ~147f using RW back to room with supervision for safety ambulating longer distances. Pt left sitting in w/c with needs in reach.  PT Discharge Precautions/Restrictions Precautions Precautions: Fall Restrictions Weight Bearing Restrictions: No Pain Pain Assessment Pain Scale: 0-10 Pain Score: 0-No pain Pain Intervention(s): Other (Comment)(therapy to tolerance) Vision/Perception  Perception Perception: Within Functional Limits Praxis Praxis: Intact  Cognition Overall Cognitive Status: Within Functional Limits for tasks assessed Arousal/Alertness: Awake/alert Orientation Level: Oriented X4 Attention: Focused;Sustained Focused Attention: Appears intact Sustained Attention: Appears intact Memory: Appears intact Awareness: Appears intact Safety/Judgment: Appears intact Sensation Sensation Light Touch: Appears Intact Hot/Cold: Not tested Proprioception: Appears Intact Stereognosis: Not tested Coordination Gross Motor Movements are Fluid and Coordinated: Yes Coordination and Movement Description: improved due to increased strength and improving endurance Heel Shin Test: symmetrical and WNL Motor  Motor Motor: Other (comment) Motor - Discharge Observations: generalized deconditioning  Mobility Bed Mobility Bed Mobility: Supine to Sit;Sit to Supine Supine to Sit: Independent Sit to Supine: Independent Transfers Transfers: Sit to Stand;Stand to SLockheed MartinTransfers Sit to Stand: Independent with assistive device Stand to Sit: Independent with assistive device Stand Pivot Transfers: Independent with assistive device Stand Pivot Transfer Details: Verbal cues for safe use of DME/AE Transfer  (Assistive device): Rolling walker Locomotion  Gait Ambulation: Yes Gait Assistance: Independent with assistive device;Supervision/Verbal cueing(mod-I ambulating short, household distances and supervision for longer, community distances) Assistive device: RBuilding surveyorAssistance Details: Verbal cues for safe use of DME/AE Gait Gait: Yes Gait Pattern: Impaired Gait Pattern: Decreased step length - right;Decreased step length - left Gait velocity: decreased Stairs / Additional Locomotion Stairs: Yes Stairs Assistance: Supervision/Verbal cueing Stair Management Technique: Two rails Number of Stairs: 12 Height of Stairs: 6 Ramp: Supervision/Verbal cueing Curb: CNurse, mental healthMobility: No  Trunk/Postural Assessment  Cervical Assessment Cervical Assessment: Within Functional Limits Thoracic Assessment Thoracic Assessment: Exceptions to WFL(rounded shoulders) Lumbar Assessment Lumbar Assessment: Exceptions to WFL(posterior pelvic tilt) Postural Control Postural Control: Deficits on evaluation Righting Reactions: impaired Protective Responses: impaired Postural Limitations: decreased  Balance Balance Balance Assessed: Yes Static Sitting Balance Static Sitting - Level of Assistance: 7: Independent Dynamic Sitting Balance Dynamic Sitting - Balance Support: During functional activity Dynamic Sitting - Level of Assistance: 7: Independent Static Standing Balance Static Standing - Balance Support: During functional activity;Bilateral upper extremity supported Static Standing - Level of Assistance: 6: Modified independent (Device/Increase time)(using RW) Dynamic Standing Balance Dynamic Standing - Balance Support: During functional activity;Bilateral upper extremity supported Dynamic Standing - Level of Assistance: 6: Modified independent (Device/Increase time);5: Stand by assistance(using RW) Extremity Assessment      RLE  Assessment RLE Assessment: Exceptions to [facility] Strength Right Hip Flexion: 4/5 Right Knee Flexion: 4/5 Right Knee Extension: 4+/5 Right Ankle Dorsiflexion: 4-/5 Right Ankle Plantar Flexion: 4/5 LLE Assessment LLE Assessment: Exceptions to [facility] HealthLLE Strength Left Hip Flexion: 4-/5 Left Knee Flexion: 4/5 Left Knee Extension: 4/5 Left Ankle Dorsiflexion: 3/5 Left Ankle Plantar Flexion: 3+/5    CTawana Scale PT, DPT 03/15/2019, 7:50 AM

## 2019-03-15 NOTE — Progress Notes (Signed)
Occupational Therapy Discharge Summary  Patient Details  Name: Ryan Rice MRN: [id number] Date of Birth: 1951/01/11  Today's Date: 03/15/2019 OT Individual Time: 1245-8099 OT Individual Time Calculation (min): 62 min   Session Note:  Pt completed functional mobility with use of the RW and modified independence down to the therapy gym.  There he completed simulated walk-in shower transfer with modified independence.  He also worked on [employer] with use of the level 1 and level 2 resistance bands.  He completed exercises of 8-10 repetitions for bilateral shoulder flexion, shoulder abduction in supine, elbow flexion, and elbow extension.  Handout was given as well to continue HEP.  He needed use of the dowel rod to complete shoulder flexion in sitting but all other exercises were completed with use of the therapy band.  He also participated in a task where he had to hold a 2 lb dowel rod in sitting and hit a beach ball back to the therapist, emphasis on shoulder strengthening.  Finished session with ambulation back to the room with use of the RW.  Pt was made modified independent in the room.    Patient has met 9 of 9 long term goals due to improved activity tolerance, improved balance, postural control and ability to compensate for deficits.  Patient to discharge at overall Modified Independent level.  Patient's care partner is independent to provide the necessary physical assistance at discharge.    Reasons goals not met: NA  Recommendation:  Patient will benefit from ongoing skilled OT services in home health setting to continue to advance functional skills in the area of BADL and Reduce care partner burden.  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    Equipment: 3:1  Reasons for discharge: treatment goals met  and discharge from hospital  Patient/family agrees with progress made and goals achieved: Yes  OT Discharge Precautions/Restrictions  Precautions Precautions: Fall Restrictions Weight Bearing Restrictions: No  Pain Pain Assessment Pain Score: 0-No pain ADL ADL Eating: Independent Grooming: Independent Where Assessed-Grooming: Standing at sink Upper Body Bathing: Independent Where Assessed-Upper Body Bathing: Shower Lower Body Bathing: Modified independent Where Assessed-Lower Body Bathing: Shower Upper Body Dressing: Independent Where Assessed-Upper Body Dressing: Wheelchair Lower Body Dressing: Modified independent Where Assessed-Lower Body Dressing: Wheelchair Toileting: Modified independent Toilet Transfer: Modified independent Armed forces technical officer Method: Counselling psychologist: Engineer, technical sales Transfer: Modified independent Social research officer, government: Modified independent Social research officer, government Method: Heritage manager: Civil engineer, contracting with back Vision Baseline Vision/History: Wears glasses;Glaucoma Wears Glasses: At all times Patient Visual Report: Other (comment)(blood hemmorage in the left eye) Additional Comments: Slighly more blurring in the left eye compared to baseline. Perception  Perception: Within Functional Limits Praxis Praxis: Intact Cognition Overall Cognitive Status: Within Functional Limits for tasks assessed Arousal/Alertness: Awake/alert Orientation Level: Oriented X4 Attention: Focused;Sustained Focused Attention: Appears intact Sustained Attention: Appears intact Memory: Appears intact Awareness: Appears intact Safety/Judgment: Appears intact Sensation Sensation Light Touch: Appears Intact Hot/Cold: Appears Intact Proprioception: Appears Intact Stereognosis: Appears Intact Additional Comments: Sensation intact in BUEs Coordination Gross Motor Movements are Fluid and Coordinated: Yes Fine Motor  Movements are Fluid and Coordinated: Yes Motor  Motor Motor: Within Functional Limits Motor - Discharge Observations: still with generalized weakness Mobility  Bed Mobility Bed Mobility: Supine to Sit;Sit to Supine Supine to Sit: Independent Sit  to Supine: Independent Transfers Sit to Stand: Independent with assistive device Stand to Sit: Independent with assistive device  Trunk/Postural Assessment  Cervical Assessment Cervical Assessment: Within Functional Limits Thoracic Assessment Thoracic Assessment: (slight thoracic rounding) Lumbar Assessment Lumbar Assessment: Exceptions to WFL(lumbar flexion in standing)  Balance Balance Balance Assessed: Yes Static Sitting Balance Static Sitting - Balance Support: Feet supported Static Sitting - Level of Assistance: 7: Independent Dynamic Sitting Balance Dynamic Sitting - Balance Support: During functional activity Dynamic Sitting - Level of Assistance: 7: Independent Static Standing Balance Static Standing - Balance Support: During functional activity Static Standing - Level of Assistance: 6: Modified independent (Device/Increase time) Dynamic Standing Balance Dynamic Standing - Balance Support: During functional activity;Bilateral upper extremity supported Dynamic Standing - Level of Assistance: 6: Modified independent (Device/Increase time);5: Stand by assistance Extremity/Trunk Assessment RUE Assessment RUE Assessment: Exceptions to [facility] General Strength Comments: Shoulder strength 3/5 with all other joints at 3+/5 LUE Assessment LUE Assessment: Exceptions to [facility] Active Range of Motion (AROM) Comments: WFLS General Strength Comments: 4/5 throughout with MMT   Ryan Rice OTR/L 03/15/2019, 4:33 PM

## 2019-03-15 NOTE — Progress Notes (Signed)
Social Work Discharge Note  The overall goal for the admission was met for:   Discharge location: Yes - home with wife  Length of Stay: Yes - 8 days  Discharge activity level: Yes - supervision/mod I  Home/community participation: Yes  Services provided included: MD, RD, PT, OT, RN, Pharmacy and [place]: Private Insurance: [place] [employer]  Follow-up services arranged: Home Health: PT/OT/RN from [facility], DME: rolling walker and bedside commode from [facility] and Patient/Family has no preference for HH/DME agencies  Comments (or additional information):  Wife came in for PT family education.  Patient/Family verbalized understanding of follow-up arrangements: Yes  Individual responsible for coordination of the follow-up plan: pt and his wife, Benji Poynter [phone]  Confirmed correct DME delivered: Trey Sailors 03/15/2019    Madalen Gavin, Silvestre Mesi

## 2019-03-15 NOTE — Discharge Summary (Signed)
Physician Discharge Summary  Patient ID: Ryan Rice MRN: [id number] DOB/AGE: Feb 16, 1951 68 y.o.  Admit date: 03/08/2019 Discharge date: 03/16/2019  Discharge Diagnoses:  Principal Problem:   Debility Active Problems:   CAP (community acquired pneumonia)   Diabetes ([place])   Glaucoma   Scleral hemorrhage of left eye   Gout flare   Hypomagnesemia   Discharged Condition: stable   Significant Diagnostic Studies: Dg Lumbar Spine 2-3 Views  Result Date: 03/13/2019 CLINICAL DATA:  Low back pain with no known injury. EXAM: LUMBAR SPINE - 2-3 VIEW COMPARISON:  CT dated 02/27/2019 FINDINGS: There is no displaced fracture. Multilevel degenerative changes are noted throughout the lumbar spine with multilevel disc height loss and multilevel facet arthrosis. Atherosclerotic changes are noted of the abdominal aorta. IMPRESSION: 1. No acute osseous abnormality. 2. Multilevel degenerative changes are noted of the lumbar spine. These were better visualized on the patient's recent CT dated 02/27/2019. Electronically Signed   By: Constance Holster M.D.   On: 03/13/2019 14:37   Dg Ankle Complete Left  Result Date: 03/05/2019 CLINICAL DATA:  Soft tissue swelling and pain EXAM: LEFT ANKLE COMPLETE - 3+ VIEW COMPARISON:  None. FINDINGS: Frontal, oblique, and lateral views were obtained. There is no fracture or joint effusion. 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 No erosive changes evident. The ankle mortise appears intact. There is widespread arterial vascular calcification. IMPRESSION: Osteoarthritic change medially. No fracture. Ankle mortise appears intact. There is widespread arterial vascular calcification, likely due to known diabetes mellitus. Electronically Signed   By: Lowella Grip III M.D.   On: 03/05/2019 11:21      Labs:  Basic Metabolic Panel: Recent Labs  Lab 03/09/19 1306 03/10/19 1028 03/11/19 0742 03/12/19 0702 03/13/19 0755 03/14/19 0800 03/15/19 0625   NA 138 138 139 139 137 137 138  K 3.7 3.8 3.7 4.3 4.4 4.5 4.4  CL 106 110 110 112* 108 108 109  CO2 21* 22 22 21* 22 23 22   GLUCOSE 153* 196* 134* 101* 224* 247* 193*  BUN 19 18 19 22 22 22  24*  CREATININE 1.01 1.10 0.88 1.02 0.97 1.22 1.16  CALCIUM 9.6 9.2 9.4 9.7 9.9 9.9 10.1  PHOS 2.1* 2.0* 2.0* 2.1* 2.0* 2.4* 2.6    CBC: CBC Latest Ref Rng & Units 03/09/2019 03/08/2019 03/07/2019  WBC 4.0 - 10.5 K/uL 10.0 12.4(H) 12.5(H)  Hemoglobin 13.0 - 17.0 g/dL 11.9(L) 9.9(L) 10.0(L)  Hematocrit 39.0 - 52.0 % 38.4(L) 30.6(L) 31.2(L)  Platelets 150 - 400 K/uL 164 106(L) 87(L)    CBG: Recent Labs  Lab 03/15/19 0631 03/15/19 1121 03/15/19 1629 03/15/19 2136 03/16/19 0643  GLUCAP 175* 220* 266* 198* 148*   Protimes: Lab Results  Component Value Date   INR 2.0 (H) 03/15/2019   INR 1.9 (H) 03/14/2019   INR 2.0 (H) 03/13/2019    Brief HPI:   [name] is a 68 year old male with history of ESRD s/p renal transplant, T2DM, CAF who was admitted on 02/27/19 with diarrhea, abdominal pain and hypoxia with increased WOB.  He was found to have extensive left greater than right PNA with sepsis and was placed on BiPAP and started on broad-spectrum antibiotics.  He went on to develop hypotension with worsening of respiratory status requiring pressors, stress dose steroids and was intubated on 6/04.  Nephrology consulted for input on acute on chronic renal failure and metabolic acidosis was treated with bicarb as well as medication adjustment.   Abdominal ultrasound showed incidental  findings of increased attenuation around left prostate as well as mass arising from it with recommendations for urology follow-up after discharge.  He has completed antibiotic course for Acinetobacter calcoacetinus/Stap negative bacteremia.  Electrolyte abnormalities have resolved with supplementation Myfortic was resumed.  He did develop pain in bilateral feet felt to be due to gout flare and prednisone was increased to 40  mg on 6/10.  Therapy was ongoing and patient was noted to be debilitated.  CIR was recommended due to functional deficits.  Hospital Course: Valente Fosberg was admitted to rehab 03/08/2019 for inpatient therapies to consist of PT  and OT at least three hours five days a week. Past admission physiatrist, therapy team and rehab RN have worked together to provide customized collaborative inpatient rehab.  He completed his antibiotic regimen on 06/13 and respiratory status is stable. His gout flare has resolved and prednisone was tapered to home dose.  Blood sugars have been monitored on AC/at bedtime basis and blood sugars were noted to be improving off of steroids but still remained elevated.   Metformin was resumed at 500 mg p.o. twice daily and patient advised to continue monitoring blood sugars AC/HS basis and follow-up with primary care provider for resumption of oral hypoglycemic meds.  Follow-up labs revealed that leukocytosis has resolved and a BLA is improving. Check of lytes revealed prerenal azotemia and he has been encouraged to increase fluid intake. Mag Ox added to help supplement hypomagnesemia. He did develop left scleral hemorrhage which was treated with local measures.  Ophthalmology was contacted for input and recommended use of eyedrops and Lacri-Lube.  Hemorrhage felt to be spontaneous in nature in setting of Coumadin and that this would resolve over time.  Pharmacy has been assisting with management of Coumadin and INR is therapeutic at discharge.  He was advised to take 5 mg Coumadin on Wednesdays and Sundays and 2.5 mg on all other days.  Home health RN to draw a pro time on 6/23 with results to Ms. Vannoy.  Patient will continue to receive further follow-up home health PT, OT and RN by [facility] after discharge   Rehab course: During patient's stay in rehab weekly team conferences were held to monitor patient's progress, set goals and discuss barriers to discharge. At admission,  patient required min assist with mobility and min to mod assist with ADL tasks.  He  has had improvement in activity tolerance, balance, postural control as well as ability to compensate for deficits. He is able to complete ADL tasks at modified independent level. He is modified independent for transfers and is able to ambulate 150 feet with rolling walker and supervision.  Family education was completed with wife regarding all aspects of care and safety.    Disposition: Home  Diet: Diabetic diet.   Special Instructions: 1. Will need urology follow up for work up of left prostate mass. 2. HHRN to draw protime on 06/23 with results to Ms. Vannoy. 3. Monitor BS ac/hs and follow up with PCP for input on resuming oral hypoglycemics.  4. No driving till cleared by MD.    Discharge Instructions    Ambulatory referral to Physical Medicine Rehab   Complete by: As directed    1-2 weeks transitional care appt     Allergies as of 03/16/2019      Reactions   Iodinated Diagnostic Agents Hives   Tape Hives      Medication List    STOP taking these medications   Ampicillin-Sulbactam 3  g in sodium chloride 0.9 % 100 mL   cinacalcet 60 MG tablet Commonly known as: SENSIPAR   finasteride 5 MG tablet Commonly known as: PROSCAR   potassium & sodium phosphates 280-160-250 MG Pack Commonly known as: PHOS-NAK     TAKE these medications   amLODipine 5 MG tablet Commonly known as: NORVASC Take 1 tablet (5 mg total) by mouth daily.   artificial tears Oint ophthalmic ointment Commonly known as: LACRILUBE Place into the left eye at bedtime as needed for dry eyes.   atorvastatin 10 MG tablet Commonly known as: LIPITOR Take 1 tablet (10 mg total) by mouth daily at 6 PM.   benzonatate 100 MG capsule Commonly known as: TESSALON Take 1 capsule (100 mg total) by mouth 3 (three) times daily as needed for cough.   dorzolamide-timolol 22.3-6.8 MG/ML ophthalmic solution Commonly known as:  COSOPT Place 1 drop into the right eye 2 (two) times a day.   famotidine 20 MG tablet Commonly known as: PEPCID Take 1 tablet (20 mg total) by mouth daily. What changed: when to take this   folic acid 1 MG tablet Commonly known as: FOLVITE Take 1 tablet (1 mg total) by mouth daily.   ketotifen 0.025 % ophthalmic solution Commonly known as: ZADITOR Place 1 drop into both eyes 2 (two) times daily.   labetalol 100 MG tablet Commonly known as: NORMODYNE Take 1 tablet (100 mg total) by mouth 2 (two) times daily.   magnesium oxide 400 MG tablet Commonly known as: MAG-OX Take 400 mg by mouth daily.   metFORMIN 500 MG tablet Commonly known as: GLUCOPHAGE Take 1 tablet (500 mg total) by mouth 2 (two) times daily with a meal.   mycophenolate 180 MG EC tablet Commonly known as: MYFORTIC Take 360 mg by mouth 2 (two) times daily.   predniSONE 10 MG tablet Commonly known as: DELTASONE Take 10 mg by mouth daily with breakfast.   sulfamethoxazole-trimethoprim 400-80 MG tablet Commonly known as: BACTRIM Take 1 tablet by mouth every Monday, Wednesday, and Friday.   tacrolimus 1 MG capsule Commonly known as: PROGRAF Take 2 mg by mouth 2 (two) times daily.   tamsulosin 0.4 MG Caps capsule Commonly known as: FLOMAX Take 0.4 mg by mouth daily after breakfast.   thiamine 250 MG tablet Take 1 tablet (250 mg total) by mouth daily.   warfarin 5 MG tablet Commonly known as: COUMADIN Take 2.5-5 mg by mouth daily. Take 2.5mg  on MON TUES THUR FRI SAT, then take 5mg  on WED and SUN.      Follow-up Information    Kirsteins, Luanna Salk, MD Follow up.   Specialty: Physical Medicine and Rehabilitation Why: Office will call you with follow up appointment Contact information: [place] [address] [phone]        Jamesetta Orleans, PA-C. Call on 03/18/2019.   Specialty: Internal Medicine Why: for a hospital follow up appointment with Ms. Jillyn Hidden to be seen within 2  weeks. Contact information: [place] [address] [phone]        Jamesetta Orleans, NP Follow up.   Specialty: Cardiology Why: New Tel# [phone]/LMB # [id number].  Contact information: [address] [place] [address] [phone]           Signed: Bary Leriche 03/20/2019, 6:21 PM

## 2019-03-15 NOTE — Progress Notes (Signed)
Social Work Patient ID: Ryan Rice, male   DOB: [birth year], 68 y.o.   MRN: [id number]   CSW met with pt on 03-13-19 after team conference to update him and his wife (via telephone) of pt's targeted d/c date of 03-16-19.  Pt/wife feel comfortable with this and CSW set pt's wife up to come for family education at [place] on 03-14-19.  CSW will arrange [facility] and order DME.  No other concerns/needs/questions at this time. CSW remains available to assist as needed.

## 2019-03-15 NOTE — Progress Notes (Signed)
Social Work Assessment and Plan  Patient Details  Name: Ryan Rice MRN: [id number] Date of Birth: Feb 02, 1951  Today's Date: 03/13/2019  Problem List:  Patient Active Problem List   Diagnosis Date Noted  . Debility 03/08/2019  . Acute renal failure (ARF) ([place])   . Community acquired pneumonia of left lower lobe of lung ([name])   . Kidney transplant recipient   . Sepsis ([place]) 02/27/2019   Past Medical History:  Past Medical History:  Diagnosis Date  . Allergic rhinitis   . BPH (benign prostatic hyperplasia)   . Chronic atrial fibrillation   . Diabetes mellitus ([place])   . DVT (deep venous thrombosis) ([facility])    LLE  . Hematochezia   . HTN (hypertension)   . Kidney transplant recipient   . PVD (peripheral vascular disease) ([place])    Past Surgical History:  Past Surgical History:  Procedure Laterality Date  . KIDNEY TRANSPLANT     Social History:  reports that he quit smoking about 35 years ago. He quit smokeless tobacco use about 6 years ago. He reports current alcohol use. He reports that he does not use drugs.  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 Legal History/Current Legal Issues: none reported Guardian/Conservator: N/A - MD has determined that pt is capable of making his own decisions.    Abuse/Neglect Abuse/Neglect Assessment Can Be Completed: Yes Physical Abuse: Denies Verbal Abuse: Denies Sexual Abuse: Denies Exploitation of patient/patient's resources: Denies Self-Neglect: Denies  Emotional Status Pt's affect, behavior and adjustment status: Pt is upbeat and in good spirits, motivated to work hard and get better, and grateful to have survived this event. Recent Psychosocial Issues: none reported Psychiatric History: none reported Substance Abuse History: none reported  Patient / Family Perceptions, Expectations & Goals Pt/Family understanding of illness & functional limitations: Pt/family report a good understanding of pt's condition. Premorbid pt/family roles/activities: Pt was taking care of lawns, liked to fish, and to spend time with his family. Anticipated changes in roles/activities/participation: Pt hopes to resume the above when/as he is able. Pt/family expectations/goals: Pt wants to get back outside to his work and fishing,  [employer]: None Premorbid Home Care/DME Agencies: None Transportation available at discharge: wife  Discharge Planning Living Arrangements: Spouse/significant other Support Systems: Spouse/significant other, Children, Other relatives, Water engineer, Social worker community Type of Residence: Private residence Insurance Resources: Multimedia programmer (specify)([employer]) Museum/gallery curator Resources: Fish farm manager, Employment Financial Screen Referred: No Money Management: Patient, Spouse Does the patient have any problems obtaining your medications?: No Home Management: Pt and wife share household chores. Patient/Family Preliminary Plans: Pt plans to return to his home where his wife will provide the needed supervision at home. Social Work Anticipated Follow Up Needs: HH/OP Expected length of stay: 9 to 12 days  Clinical Impression CSW met with pt to introduce self and role of CSW, as well as  to complete assessment.  CSW later talked with pt's wife telephone to do the same.  Pt is upbeat and positive about his recovery, his care at the hospital, and just overall that he made it through this event.  Pt reports feeling well emotionally ad is motivated to work hard and to get home.  No current needs/concerns/questoins.  CSW will remain available to assist as needed.  Xitlaly Ault, Silvestre Mesi 03/15/2019, 11:24 AM

## 2019-03-15 NOTE — Discharge Instructions (Signed)
Inpatient Rehab Discharge Instructions  Ryan Rice Discharge date and time: 03/16/19   Activities/Precautions/ Functional Status: Activity: no lifting, driving, or strenuous exercise for till cleared by MD.  Diet: cardiac diet and diabetic diet Wound Care: none needed    Functional status:  ___ No restrictions     ___ Walk up steps independently ___ 24/7 supervision/assistance   ___ Walk up steps with assistance _X__ Intermittent supervision/assistance  ___ Bathe/dress independently ___ Walk with walker     ___ Bathe/dress with assistance ___ Walk Independently    ___ Shower independently ___ Walk with assistance    _X__ Shower with assistance _X__ No alcohol     ___ Return to work/school ________   COMMUNITY REFERRALS UPON DISCHARGE:   Home Health:   PT     OT    RN  Agency:  [facility] Phone:  [phone] Medical Equipment/Items Ordered:  Rolling walker; bedside commode  Agency/Supplier:  [facility]      Phone:  [phone]  Special Instructions: 1. Monitor blood sugars before meals and at bedtime. Follow up with primary care for input on resuming diabetic meds.  2. HHRN to draw protime on Tues 6/23 with results to Ms. Juanita Laster [phone])    My questions have been answered and I understand these instructions. I will adhere to these goals and the provided educational materials after my discharge from the hospital.  Patient/Caregiver Signature _______________________________ Date __________  Clinician Signature _______________________________________ Date __________  Please bring this form and your medication list with you to all your follow-up doctor's appointments.

## 2019-03-15 NOTE — Progress Notes (Signed)
[facility] PHYSICAL MEDICINE & REHABILITATION PROGRESS NOTE   Subjective/Complaints:  Patient looking forward to discharge tomorrow.  ROS:  Denies CP, SOB, N/V/D  Objective:   Dg Lumbar Spine 2-3 Views  Result Date: 03/13/2019 CLINICAL DATA:  Low back pain with no known injury. EXAM: LUMBAR SPINE - 2-3 VIEW COMPARISON:  CT dated 02/27/2019 FINDINGS: There is no displaced fracture. Multilevel degenerative changes are noted throughout the lumbar spine with multilevel disc height loss and multilevel facet arthrosis. Atherosclerotic changes are noted of the abdominal aorta. IMPRESSION: 1. No acute osseous abnormality. 2. Multilevel degenerative changes are noted of the lumbar spine. These were better visualized on the patient's recent CT dated 02/27/2019. Electronically Signed   By: Constance Holster M.D.   On: 03/13/2019 14:37   No results for input(s): WBC, HGB, HCT, PLT in the last 72 hours. Recent Labs    03/14/19 0800 03/15/19 0625  NA 137 138  K 4.5 4.4  CL 108 109  CO2 23 22  GLUCOSE 247* 193*  BUN 22 24*  CREATININE 1.22 1.16  CALCIUM 9.9 10.1    Intake/Output Summary (Last 24 hours) at 03/15/2019 0739 Last data filed at 03/15/2019 0734 Gross per 24 hour  Intake 979 ml  Output 2600 ml  Net -1621 ml     Physical Exam: Vital Signs Blood pressure (!) 149/72, pulse (!) 56, temperature 98 F (36.7 C), resp. rate 18, height 5\' 11"  (1.803 m), weight 94.2 kg, SpO2 99 %.   General: No acute distress Mood and affect are appropriate Eyes Left subconjunctival hemorrhage, no drainage, able to read print with each eye denies blurriness on Left side  Heart: Regular rate and rhythm no rubs murmurs or extra sounds Lungs: Clear to auscultation, breathing unlabored, no rales or wheezes Abdomen: Positive bowel sounds, soft nontender to palpation, nondistended Extremities: No clubbing, cyanosis, or edema Skin: No evidence of breakdown, no evidence of rash Neurologic: Cranial nerves  II through XII intact, motor strength is 4/5 in bilateral deltoid, bicep, tricep, grip, hip flexor, knee extensors, ankle dorsiflexor and plantar flexor Sensory exam normal sensation to light touch and proprioception in bilateral upper and lower extremities  Musculoskeletal: Full range of motion in all 4 extremities. No joint swelling   Assessment/Plan: 1. Functional deficits secondary to Debility following sepsis which require 3+ hours per day of interdisciplinary therapy in a comprehensive inpatient rehab setting.  Physiatrist is providing close team supervision and 24 hour management of active medical problems listed below.  Physiatrist and rehab team continue to assess barriers to discharge/monitor patient progress toward functional and medical goals  Care Tool:  Bathing    Body parts bathed by patient: Right arm, Left arm, Chest, Abdomen, Front perineal area, Left upper leg, Right upper leg, Right lower leg, Left lower leg, Face, Buttocks   Body parts bathed by helper: Buttocks     Bathing assist Assist Level: Supervision/Verbal cueing     Upper Body Dressing/Undressing Upper body dressing   What is the patient wearing?: Pull over shirt    Upper body assist Assist Level: Set up assist    Lower Body Dressing/Undressing Lower body dressing      What is the patient wearing?: Underwear/pull up, Pants     Lower body assist Assist for lower body dressing: Supervision/Verbal cueing     Toileting Toileting    Toileting assist Assist for toileting: Supervision/Verbal cueing Assistive Device Comment: urinal   Transfers Chair/bed transfer  Transfers assist     Chair/bed  transfer assist level: Supervision/Verbal cueing     Locomotion Ambulation   Ambulation assist      Assist level: Supervision/Verbal cueing Assistive device: Walker-rolling Max distance: 337ft   Walk 10 feet activity   Assist     Assist level: Supervision/Verbal cueing Assistive  device: Walker-rolling   Walk 50 feet activity   Assist    Assist level: Supervision/Verbal cueing Assistive device: Walker-rolling    Walk 150 feet activity   Assist Walk 150 feet activity did not occur: Safety/medical concerns  Assist level: Supervision/Verbal cueing Assistive device: Walker-rolling    Walk 10 feet on uneven surface  activity   Assist     Assist level: Supervision/Verbal cueing(up/down ramp) Assistive device: Aeronautical engineer Will patient use wheelchair at discharge?: No             Wheelchair 50 feet with 2 turns activity    Assist            Wheelchair 150 feet activity     Assist          Medical Problem List and Plan: 1.  Functional and mobility deficits secondary to sepsis, gout and multiple medical considerations. Resolving encephalopathy             -CIR PT, OT   2.  Antithrombotics: -DVT/anticoagulation:  Pharmaceutical: Coumadin pharm protocol            -antiplatelet therapy: N/A 3. Pain Management: tylenol prn 4. Mood: LCSW to follow for evaluation and support.              -antipsychotic agents: N/A 5. Neuropsych: This patient is capable of making decisions on his own behalf. 6. Skin/Wound Care: Routine pressure relief measures 7. Fluids/Electrolytes/Nutrition: Strict I/O. Daily weights. Poor intake of fluid  8. T2DM: Was on actos, metformin and Januvia. Monitor BS ac/hs and continue SSI for now creatinine improved will restart metformin CBG (last 3)  Recent Labs    03/14/19 1637 03/14/19 2110 03/15/19 0631  GLUCAP 372* 233* 175*  some lability but overall improving with activity now on home dose prednisone, blood sugar still elevated 9. 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  Renal failure resolved.  10. CAP:  Completed Cefepime- 11. Cough: Continue IS.  12. Gout flare: resolving on  prednisone but was  still in a good deal of pain today. Observe with therapies.              -continue prednisone at current home dose  13. Left prostatic mass/Right renal mass: follow up with GU on outpatient basis.      LOS: 7 days A FACE TO FACE EVALUATION WAS PERFORMED  Charlett Blake 03/15/2019, 7:39 AM

## 2019-03-15 NOTE — Patient Care Conference (Signed)
Inpatient RehabilitationTeam Conference and Plan of Care Update Date: 03/13/2019   Time: 11:05 AM    Patient Name: Ryan Rice      Medical Record Number: [id number]  Date of Birth: May 18, 1951 Sex: Male         Room/Bed: 4W07C/4W07C-01 Payor Info: Payor: [employer] MEDICARE / Plan: [employer] MEDICARE HMO / Product Type: *No Product type* /    Admitting Diagnosis: 4. CVA 2 Team debility, sepsis; 11-12 days  Admit Date/Time:  03/08/2019  6:20 PM Admission Comments: No comment available   Primary Diagnosis:  <principal problem not specified> Principal Problem: <principal problem not specified>  Patient Active Problem List   Diagnosis Date Noted  . Debility 03/08/2019  . Acute renal failure (ARF) ([place])   . Community acquired pneumonia of left lower lobe of lung ([place])   . Kidney transplant recipient   . Sepsis ([place]) 02/27/2019    Expected Discharge Date: Expected Discharge Date: 03/16/19  Team Members Present: Physician leading conference: Dr. Alysia Penna Social Worker Present: Alfonse Alpers, LCSW Nurse Present: Dorthula Nettles, RN PT Present: Barrie Folk, PT OT Present: Clyda Greener, OT SLP Present: Charolett Bumpers, SLP PPS Coordinator present : Gunnar Fusi, SLP     Current Status/Progress Goal Weekly Team Focus  Medical   left subconjunctival hemorrhage, PNA resolved  Maintain med stability with resolution of acute on chronic RF  Discharge planning, evaluate back pain   Bowel/Bladder   continent of b/b; LBM: 06/16  remain continent of b/b  assist with tolieting needs prn   Swallow/Nutrition/ Hydration             ADL's   supervision overall for bathing, dressing, toileting, functional transfers.  Decreased standing tolerance without assistive device secondary to increased lower back pain.  modified independent level  selfcare retraining, transfer training, balance retraining, endurance building, pt/family education, DME education   Mobility   supervision-CGA for  all mobility up to 150 ft with RW  supervision-mod I  balance, strength, endurance, d/c planning, education   Communication             Safety/Cognition/ Behavioral Observations            Pain   no c/o pain; prn tylenol q4hr  remain pain free  assess pain level QS and prn   Skin   skin free of breakdown/infection  maintain skin integrity  assess skin QS and prn    Rehab Goals Patient on target to meet rehab goals: Yes Rehab Goals Revised: none *See Care Plan and progress notes for long and short-term goals.     Barriers to Discharge  Current Status/Progress Possible Resolutions Date Resolved   Physician    Medical stability     Progressing towards goals  See above      Nursing                  PT                    OT                  SLP                SW                Discharge Planning/Teaching Needs:  Pt to return to his home where his wife can provide supervision, as needed.  Wife to come in for family education prior to d/c.   Team Discussion:  Pt has restarted eye drops and does not have too much vision loss.  Pt with elevated INR at admission.  Pt is tolerating prednisone taper.  Pt has minor itching in left eye.  Pt is continent of bowel and bladder and blood sugars are coming down with some occasional spikes.  Pt is set up A for all UB self care tasks and S for LB.  Pt needs supervision with txs.  Pt's shoulder weakness on right is greater than the left.  Pt with lower back pain without upper body support and this is new since admission, stretching helps.  Pt is S, close to mod I.  Pt is CGA/S with ambulation 200' with RW.  Back pain affects pt's posture and safety with gait.  Pt with supervision to mod I PT goals.  Revisions to Treatment Plan:  none    Continued Need for Acute Rehabilitation Level of Care: The patient requires daily medical management by a physician with specialized training in physical medicine and rehabilitation for the following  conditions: Daily direction of a multidisciplinary physical rehabilitation program to ensure safe treatment while eliciting the highest outcome that is of practical value to the patient.: Yes Daily medical management of patient stability for increased activity during participation in an intensive rehabilitation regime.: Yes Daily analysis of laboratory values and/or radiology reports with any subsequent need for medication adjustment of medical intervention for : Pulmonary problems;Other   I attest that I was present, lead the team conference, and concur with the assessment and plan of the team.Team conference was held via web/ teleconference due to [place] - 19.   Demarrius Guerrero, Silvestre Mesi 03/15/2019, 11:06 AM

## 2019-03-15 NOTE — Progress Notes (Signed)
[facility] Individual Statement of Services  Patient Name:  Ryan Rice  Date:  03/15/2019  Welcome to the [facility].  Our goal is to provide you with an individualized program based on your diagnosis and situation, designed to meet your specific needs.  With this comprehensive rehabilitation program, you will be expected to participate in at least 3 hours of rehabilitation therapies Monday-Friday, with modified therapy programming on the weekends.  Your rehabilitation program will include the following services:  Physical Therapy (PT), Occupational Therapy (OT), 24 hour per day rehabilitation nursing, Case Management (Social Worker), Rehabilitation Medicine, Nutrition Services and Pharmacy Services  Weekly team conferences will be held on Wednesdays to discuss your progress.  Your Social Worker will talk with you frequently to get your input and to update you on team discussions.  Team conferences with you and your family in attendance may also be held.  Expected length of stay:  9 to 12 days  Overall anticipated outcome:  Supervision with some modified independence  Depending on your progress and recovery, your program may change. Your Social Worker will coordinate services and will keep you informed of any changes. Your Social Worker's name and contact numbers are listed  below.  The following services may also be recommended but are not provided by the [facility] will be made to provide these services after discharge if needed.  Arrangements include referral to agencies that provide these services.  Your insurance has been verified to be:  [employer] Your primary doctor is:  Halford Chessman, [place]  Pertinent information will be shared with your doctor and your insurance  company.  Social Worker:  Alfonse Alpers, LCSW  [phone] or [phone]  Information discussed with and copy given to patient by: Trey Sailors, 03/15/2019, 11:00 AM

## 2019-03-15 NOTE — Progress Notes (Signed)
Occupational Therapy Session Note  Patient Details  Name: Ryan Rice MRN: [id number] Date of Birth: October 13, 1950  Today's Date: 03/15/2019 OT Individual Time: 9558-3167 OT Individual Time Calculation (min): 30 min    Short Term Goals: Week 1:  OT Short Term Goal 1 (Week 1): Pt will tolerate standing at the sink for 5 minutes within BADL task OT Short Term Goal 2 (Week 1): Pt will complete LB dressing with supervision OT Short Term Goal 3 (Week 1): Pt will complete toilet transfer with CGA    Skilled Therapeutic Interventions/Progress Updates:    Pt seen this session to work on standing balance and activity tolerance.  Pt stood from w/c to RW with S and practiced standing using B hands for alternating overhead reaches, rested, stood with alternating reaches to each side of knees, rested, standing reaching to B knees with a squat then lifting to B arms stretching overhead 5x for 3 sets, with a short rest between sets.  Pt participated well. Resting in w/c with belt alarm on and all needs met.  Therapy Documentation Precautions:  Precautions Precautions: Fall Restrictions Weight Bearing Restrictions: No  Pain: Pain Assessment Pain Scale: 0-10 Pain Score: 0-No pain Pain Intervention(s): Other (Comment)(therapy to tolerance)   Therapy/Group: Individual Therapy  [place] 03/15/2019, 12:17 PM

## 2019-03-15 NOTE — Progress Notes (Signed)
ANTICOAGULATION CONSULT NOTE - Follow Up Consult  Pharmacy Consult for Warfarin Indication: hx of DVT  Allergies  Allergen Reactions  . Iodinated Diagnostic Agents Hives  . Tape Hives   Patient Measurements: Height: 5\' 11"  (180.3 cm) Weight: 207 lb 11.1 oz (94.2 kg) IBW/kg (Calculated) : 75.3  Vital Signs: Temp: 98 F (36.7 C) (06/19 0517) BP: 149/72 (06/19 0517) Pulse Rate: 56 (06/19 0517)  Labs: Recent Labs    03/13/19 0755 03/14/19 0800 03/15/19 0625  LABPROT 22.6* 21.9* 22.3*  INR 2.0* 1.9* 2.0*  CREATININE 0.97 1.22 1.16   Estimated Creatinine Clearance: 71.5 mL/min (by C-G formula based on SCr of 1.16 mg/dL).  Assessment: 68 yo M transfer from outside hospital with diarrhea/dyspnea. On warfarin PTA for history of DVT. Pt was briefly on heparin gtt on 6/7, now pharmacy consulted to manage warfarin therapy.    PTA warfarin regimen: 5mg  on Wed/Sun, 2.5mg  all other days. INR was 7.4 on admit.  INR = 2 today. Hgb improved to 11.9 (6/13), Pltc improved to WNL.     Note possible drug-drug interaction with Bactrim, however Bactrim was also a PTA medication.  Goal of Therapy:  INR 2-3 Monitor platelets by anticoagulation protocol: Yes   Plan:  Warfarin 2.5 mg PO x 1 today Daily PT/INR Monitor closely for s/sx of bleeding   Lindell Spar, PharmD, BCPS Clinical Pharmacist Please utilize [facility] for appropriate phone number to reach the unit pharmacist ([place]) 03/15/2019,2:03 PM

## 2019-03-16 DIAGNOSIS — H1132 Conjunctival hemorrhage, left eye: Secondary | ICD-10-CM

## 2019-03-16 LAB — GLUCOSE, CAPILLARY: Glucose-Capillary: 148 mg/dL — ABNORMAL HIGH (ref 70–99)

## 2019-03-16 MED ORDER — FUROSEMIDE 20 MG PO TABS
20.0000 mg | ORAL_TABLET | Freq: Once | ORAL | Status: AC
  Administered 2019-03-16: 10:00:00 20 mg via ORAL
  Filled 2019-03-16: qty 1

## 2019-03-16 NOTE — Progress Notes (Signed)
Patient to be discharged today. Per patient all his discharge papers are with him and was discussed with him. No further questions noted. Waiting for wife to pick him up.

## 2019-03-16 NOTE — Progress Notes (Signed)
Patient discharged to home per wheelchair accompanied by NT and RN

## 2019-03-16 NOTE — Progress Notes (Signed)
[facility] PHYSICAL MEDICINE & REHABILITATION PROGRESS NOTE   Subjective/Complaints:  Swelling at both ankles and feet, states he has tried support hose but feels like they are too tight.  He states that he does not eat a lot of salt.  No breathing issues.  ROS:  Denies CP, SOB, N/V/D  Objective:   No results found. No results for input(s): WBC, HGB, HCT, PLT in the last 72 hours. Recent Labs    03/14/19 0800 03/15/19 0625  NA 137 138  K 4.5 4.4  CL 108 109  CO2 23 22  GLUCOSE 247* 193*  BUN 22 24*  CREATININE 1.22 1.16  CALCIUM 9.9 10.1    Intake/Output Summary (Last 24 hours) at 03/16/2019 0910 Last data filed at 03/16/2019 0700 Gross per 24 hour  Intake 924 ml  Output 1575 ml  Net -651 ml     Physical Exam: Vital Signs Blood pressure (!) 142/62, pulse (!) 57, temperature 97.7 F (36.5 C), resp. rate 16, height 5\' 11"  (1.803 m), weight 85.5 kg, SpO2 100 %.   General: No acute distress Neck negative JVD Mood and affect are appropriate Eyes Left subconjunctival hemorrhage, no drainage, able to read print with each eye denies blurriness on Left side  Heart: Regular rate and rhythm no rubs murmurs or extra sounds Lungs: Clear to auscultation, breathing unlabored, no rales or wheezes, unchanged Abdomen: Positive bowel sounds, soft nontender to palpation, nondistended Extremities: No clubbing, cyanosis, 1-2+ edema at both ankles.  Nontender to palpation No calf tenderness.  No pain with ankle range of motion. Skin: No evidence of breakdown, no evidence of rash Neurologic: Cranial nerves II through XII intact, motor strength is 4/5 in bilateral deltoid, bicep, tricep, grip, hip flexor, knee extensors, ankle dorsiflexor and plantar flexor Sensory exam normal sensation to light touch and proprioception in bilateral upper and lower extremities  Musculoskeletal: Full range of motion in all 4 extremities. No joint swelling   Assessment/Plan: 1. Functional deficits  secondary to Debility following sepsis Stable for D/C today F/u PCP in 1-2 weeks F/u PM&R PRN See D/C summary See D/C instructions Care Tool:  Bathing    Body parts bathed by patient: Right arm, Left arm, Chest, Abdomen, Front perineal area, Left upper leg, Right upper leg, Right lower leg, Left lower leg, Face, Buttocks   Body parts bathed by helper: Buttocks     Bathing assist Assist Level: Independent with assistive device     Upper Body Dressing/Undressing Upper body dressing   What is the patient wearing?: Pull over shirt    Upper body assist Assist Level: Independent    Lower Body Dressing/Undressing Lower body dressing      What is the patient wearing?: Underwear/pull up, Pants     Lower body assist Assist for lower body dressing: Independent with assitive device Assistive Device Comment: RW   Toileting Toileting    Toileting assist Assist for toileting: Independent with assistive device Assistive Device Comment: urinal   Transfers Chair/bed transfer  Transfers assist     Chair/bed transfer assist level: Independent with assistive device Chair/bed transfer assistive device: Museum/gallery exhibitions officer assist      Assist level: Supervision/Verbal cueing Assistive device: Walker-rolling Max distance: 125ft   Walk 10 feet activity   Assist     Assist level: Independent with assistive device(pt able to walk short distances mod-I) Assistive device: Walker-rolling   Walk 50 feet activity   Assist    Assist level: Supervision/Verbal  cueing Assistive device: Walker-rolling    Walk 150 feet activity   Assist Walk 150 feet activity did not occur: Safety/medical concerns  Assist level: Supervision/Verbal cueing Assistive device: Walker-rolling    Walk 10 feet on uneven surface  activity   Assist     Assist level: Supervision/Verbal cueing(up/down ramp) Assistive device: Horticulturist, commercial Will patient use wheelchair at discharge?: No             Wheelchair 50 feet with 2 turns activity    Assist            Wheelchair 150 feet activity     Assist          Medical Problem List and Plan: 1.  Functional and mobility deficits secondary to sepsis, gout and multiple medical considerations. Resolving encephalopathy             -CIR PT, OT   2.  Antithrombotics: -DVT/anticoagulation:  Pharmaceutical: Coumadin pharm protocol            -antiplatelet therapy: N/A 3. Pain Management: tylenol prn 4. Mood: LCSW to follow for evaluation and support.              -antipsychotic agents: N/A 5. Neuropsych: This patient is capable of making decisions on his own behalf. 6. Skin/Wound Care: Routine pressure relief measures 7. Fluids/Electrolytes/Nutrition: Strict I/O. Daily weights. Increased peripheral edema , no sign of CHF, poor recorded intake, likely venous insufficieny give one time dose lasix, elevate, limit Na+ intake and use support hose8. T2DM: Was on actos, metformin and Januvia. Monitor BS ac/hs and continue SSI for now creatinine improved will restart metformin CBG (last 3)  Recent Labs    03/15/19 1629 03/15/19 2136 03/16/19 0643  GLUCAP 266* 198* 148*  some lability but overall improving with activity now on home dose prednisone, blood sugar still elevated 9. 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  Renal failure resolved.  10. CAP:  Completed Cefepime- 11. Cough: Continue IS.  12. Gout flare: resolving on  prednisone but was still in a good deal of pain today. Observe with therapies.              -continue prednisone at current home dose  13. Left prostatic mass/Right renal mass: follow up with GU on outpatient basis.      LOS: 8 days A FACE TO FACE EVALUATION WAS PERFORMED  Charlett Blake 03/16/2019, 9:10 AM

## 2019-03-19 ENCOUNTER — Telehealth: Payer: Self-pay | Admitting: *Deleted

## 2019-03-19 NOTE — Telephone Encounter (Signed)
[facility] called and Ryan Rice is requesting his OT start next week as he has too many appointments this week (the patient).  Approval given to start next week.

## 2019-03-20 ENCOUNTER — Encounter (HOSPITAL_COMMUNITY): Payer: Self-pay | Admitting: Physical Medicine and Rehabilitation

## 2019-03-20 ENCOUNTER — Telehealth: Payer: Self-pay | Admitting: Physical Medicine & Rehabilitation

## 2019-03-20 HISTORY — DX: Hypomagnesemia: E83.42

## 2019-03-20 NOTE — Telephone Encounter (Signed)
error 

## 2019-03-20 NOTE — Telephone Encounter (Signed)
I spoke with Sonia Side and gave him the order which was on discharge summary and should result to Ms Savage PA.

## 2019-03-20 NOTE — Telephone Encounter (Signed)
Ryan Rice or Ryan Rice (Not Sure which it is) with [facility] needs to get verbal orders to have skilled nursing to draw INR.  Please call him at [phone].

## 2019-03-28 ENCOUNTER — Encounter: Payer: Self-pay | Admitting: Registered Nurse

## 2019-03-28 ENCOUNTER — Encounter: Payer: Medicare HMO | Attending: Registered Nurse | Admitting: Registered Nurse

## 2019-03-28 ENCOUNTER — Other Ambulatory Visit: Payer: Self-pay

## 2019-03-28 VITALS — BP 143/74 | HR 86 | Temp 98.1°F | Ht 71.0 in | Wt 170.0 lb

## 2019-03-28 DIAGNOSIS — H1132 Conjunctival hemorrhage, left eye: Secondary | ICD-10-CM | POA: Diagnosis not present

## 2019-03-28 DIAGNOSIS — R5381 Other malaise: Secondary | ICD-10-CM | POA: Diagnosis present

## 2019-03-28 NOTE — Progress Notes (Signed)
Subjective:    Patient ID: Ryan Rice, male    DOB: October 24, 1950, 68 y.o.   MRN: [id number]  HPI: Ryan Rice is a 67 y.o. male who is here for transitional care visit appointment for follow up for debility and sclera hemorrhage. He was admitted on 02/27/2019 at [facility] with abdominal pain and hypoxia.   DG Chest:  IMPRESSION: Large left lower lobe airspace opacity consistent with pneumonia. Followup PA and lateral chest X-ray is recommended in 3-4 weeks following trial of antibiotic therapy to ensure resolution and exclude underlying malignancy.  Aortic Atherosclerosis (ICD10-I70.0).  CT Abdomen:  IMPRESSION: 1. Extensive pneumonia, most severe in the inferior lingula and left lower lobe with more patchy infiltrate in the right middle lobe and posterior segment right lower lobe.  2. There is a 1.3 x 1.3 cm focus of decreased attenuation along the leftward aspect of prostate anteriorly. A small prostatic neoplasm could present in this manner. Infectious lesion also could present in this manner. This finding warrants urologic assessment and correlation with PSA.  3. Normal appearing transplant kidney in the right lower quadrant. Atrophic native kidneys. Note that there is a noncystic mass arising from the lateral native kidney on the right. A small neoplasm in this area cannot be excluded. Nonemergent MR of the native kidneys pre and post-contrast may well be advisable given this concern.  4. Colonic diverticulosis without diverticulitis. No bowel obstruction. No abscess in the abdomen or pelvis. Appendix appears normal.  5.  Cholelithiasis.  Gallbladder mildly distended.  6. Extensive aortic and pelvic arterial atherosclerosis. Multiple foci of coronary artery calcification also noted.  7.  Small ventral hernia containing only fat.  Renal US  IMPRESSION: 1. Unremarkable appearance of the right lower quadrant transplant kidney without evidence  of hydronephrosis. The resistive indices are within the normal range. 2. Increasing free fluid in the right lower quadrant when compared to CT dated 02/27/2019.  [name] was diagnosed with extensive pneumonia with sepsis and placed on BiPAP, he was started on antibiotics.   Nephrology was also consulted to acute on chronic renal failure.   [name] developed left screra hemorrhage and opthalmology was consulted and recommended eye drops and Lacri-lube.   [name] was admitted to Inpatient Rehabilitation on 03/08/2019 and discharged home on 03/16/2019. He is receiving outpatient therapy with [facility]. He denies any pain. He rated his pain 0. Also reports he has a good appetite.   Pain Inventory Average Pain 0 Pain Right Now 0 My pain is n/a  In the last 24 hours, has pain interfered with the following? General activity 0 Relation with others 0 Enjoyment of life 0 What TIME of day is your pain at its worst? n/a Sleep (in general) Good  Pain is worse with: walking and n/a Pain improves with: n/a Relief from Meds: 0  Mobility use a walker transfers alone  Function disabled: date disabled 2002 retired  Neuro/Psych trouble walking  Prior Studies n/a  Physicians involved in your care n/a   Family History  Problem Relation Age of Onset  . Stroke Mother   . Heart disease Father   . Diabetes Father   . Diabetes Sister   . Diabetes Brother   . Heart disease Brother    Social History   Socioeconomic History  . Marital status: Married    Spouse name: Not on file  . Number of children: Not on file  . Years of education: Not on file  .  Highest education level: Not on file  Occupational History  . Not on file  Social Needs  . Financial resource strain: Not on file  . Food insecurity    Worry: Not on file    Inability: Not on file  . Transportation needs    Medical: Not on file    Non-medical: Not on file  Tobacco Use  . Smoking status:  Former Smoker    Quit date: 03/07/1984    Years since quitting: 35.0  . Smokeless tobacco: Former Systems developer    Quit date: 03/07/2013  . Tobacco comment: N/A  Substance and Sexual Activity  . Alcohol use: Yes    Comment: rare use  . Drug use: Never  . Sexual activity: Not on file  Lifestyle  . Physical activity    Days per week: Not on file    Minutes per session: Not on file  . Stress: Not on file  Relationships  . Social Herbalist on phone: Not on file    Gets together: Not on file    Attends religious service: Not on file    Active member of club or organization: Not on file    Attends meetings of clubs or organizations: Not on file    Relationship status: Not on file  Other Topics Concern  . Not on file  Social History Narrative  . Not on file   Past Surgical History:  Procedure Laterality Date  . KIDNEY TRANSPLANT     Past Medical History:  Diagnosis Date  . Allergic rhinitis   . BPH (benign prostatic hyperplasia)   . Chronic atrial fibrillation   . Diabetes mellitus ([place])   . DVT (deep venous thrombosis) (HCC)    LLE  . Hematochezia   . HTN (hypertension)   . Hypomagnesemia 03/20/2019  . Kidney transplant recipient   . PVD (peripheral vascular disease) (HCC)    BP (!) 143/74   Pulse 86   Temp 98.1 F (36.7 C)   Ht 5\' 11"  (1.803 m)   Wt 170 lb (77.1 kg)   SpO2 98%   BMI 23.71 kg/m   Opioid Risk Score:   Fall Risk Score:  `1  Depression screen PHQ 2/9  No flowsheet data found.   Review of Systems     Objective:   Physical Exam Vitals signs and nursing note reviewed.  Constitutional:      Appearance: Normal appearance.  Neck:     Musculoskeletal: Normal range of motion and neck supple.  Cardiovascular:     Rate and Rhythm: Normal rate and regular rhythm.     Pulses: Normal pulses.     Heart sounds: Normal heart sounds.  Pulmonary:     Effort: Pulmonary effort is normal.     Breath sounds: Normal breath sounds.  Musculoskeletal:      Comments: Normal Muscle Bulk and Muscle Testing Reveals:  Upper Extremities: Full ROM and Muscle Strength 5/5 Lower Extremities : Full ROM and Muscle Strength 5/5 Arises from Table with ease Narrow Based Gait   Skin:    General: Skin is warm and dry.  Neurological:     Mental Status: He is alert and oriented to person, place, and time.  Psychiatric:        Mood and Affect: Mood normal.        Behavior: Behavior normal.           Assessment & Plan:  1. Debility: Continue Home Health Therapy. Continue to Monitor 2.  Sclera Hemorrhage: PCP Following. Continue current medication regimen.   15 minutes of face to face patient care time was spent during this visit. All questions were encouraged and answered.  F/U with Dr Letta Pate in 4- 6 weeks.

## 2019-04-01 DIAGNOSIS — I82402 Acute embolism and thrombosis of unspecified deep veins of left lower extremity: Secondary | ICD-10-CM

## 2019-04-01 DIAGNOSIS — Z7901 Long term (current) use of anticoagulants: Secondary | ICD-10-CM

## 2019-04-01 DIAGNOSIS — G9341 Metabolic encephalopathy: Secondary | ICD-10-CM | POA: Diagnosis not present

## 2019-04-01 DIAGNOSIS — N400 Enlarged prostate without lower urinary tract symptoms: Secondary | ICD-10-CM

## 2019-04-01 DIAGNOSIS — A411 Sepsis due to other specified staphylococcus: Secondary | ICD-10-CM

## 2019-04-01 DIAGNOSIS — Z7984 Long term (current) use of oral hypoglycemic drugs: Secondary | ICD-10-CM

## 2019-04-01 DIAGNOSIS — Z792 Long term (current) use of antibiotics: Secondary | ICD-10-CM

## 2019-04-01 DIAGNOSIS — Z5181 Encounter for therapeutic drug level monitoring: Secondary | ICD-10-CM

## 2019-04-01 DIAGNOSIS — J189 Pneumonia, unspecified organism: Secondary | ICD-10-CM | POA: Diagnosis not present

## 2019-04-01 DIAGNOSIS — Z87891 Personal history of nicotine dependence: Secondary | ICD-10-CM

## 2019-04-01 DIAGNOSIS — E1151 Type 2 diabetes mellitus with diabetic peripheral angiopathy without gangrene: Secondary | ICD-10-CM

## 2019-04-01 DIAGNOSIS — Z9181 History of falling: Secondary | ICD-10-CM

## 2019-04-01 DIAGNOSIS — I482 Chronic atrial fibrillation, unspecified: Secondary | ICD-10-CM

## 2019-04-01 DIAGNOSIS — N429 Disorder of prostate, unspecified: Secondary | ICD-10-CM

## 2019-04-01 DIAGNOSIS — Z940 Kidney transplant status: Secondary | ICD-10-CM

## 2019-04-01 DIAGNOSIS — I10 Essential (primary) hypertension: Secondary | ICD-10-CM

## 2019-04-01 DIAGNOSIS — M109 Gout, unspecified: Secondary | ICD-10-CM

## 2019-04-01 DIAGNOSIS — B9689 Other specified bacterial agents as the cause of diseases classified elsewhere: Secondary | ICD-10-CM

## 2019-05-02 ENCOUNTER — Other Ambulatory Visit: Payer: Self-pay | Admitting: Physical Medicine and Rehabilitation

## 2019-05-10 ENCOUNTER — Encounter: Payer: Self-pay | Admitting: Physical Medicine & Rehabilitation

## 2019-05-10 ENCOUNTER — Other Ambulatory Visit: Payer: Self-pay

## 2019-05-10 ENCOUNTER — Encounter: Payer: Medicare HMO | Attending: Registered Nurse | Admitting: Physical Medicine & Rehabilitation

## 2019-05-10 VITALS — BP 146/77 | HR 71 | Temp 98.2°F | Ht 71.0 in | Wt 175.8 lb

## 2019-05-10 DIAGNOSIS — H1132 Conjunctival hemorrhage, left eye: Secondary | ICD-10-CM | POA: Diagnosis present

## 2019-05-10 DIAGNOSIS — R5381 Other malaise: Secondary | ICD-10-CM | POA: Diagnosis present

## 2019-05-10 DIAGNOSIS — G5621 Lesion of ulnar nerve, right upper limb: Secondary | ICD-10-CM | POA: Diagnosis not present

## 2019-05-10 DIAGNOSIS — R29898 Other symptoms and signs involving the musculoskeletal system: Secondary | ICD-10-CM

## 2019-05-10 NOTE — Patient Instructions (Signed)
Your physician has ordered a Nerve Conduction Study (NCV) and/or EMG testing.  This is a test to assess the status of your nerves and muscles. For the NCV portion of the test , sticky tabs will be placed on either your hands or feet.  Your nerves will be stimulated using small electrical charges and the speed of the impulse will be measured as it travels down the nerve. For the EMG portion of the test, a small pin will be placed below the surface of the skin to measure the electrical activity in certain muscles in your arms or legs. Eating/drinking prior to testing is ok.  Please DO NOT discontinue ANY medications prior to testing  Your appointment will be scheduled by a member of our team.  You will need to check in with the main reception desk. Your test could take approximately 30 minutes to 1 hour to complete depending on the extent of the test that has been ordered. TESTING ON LEGS/BACK/HIP/FOOT AREA: Bring/wear a pair of shorts.  **ABSOLUTELY NO LOTIONS, MOISTURIZERS, VASELINE, OILS OR CREAMS OF ANY KIND ON ANY BODY PART THE DAY OF THE TEST**  **DEODORANT IS OK TO WEAR**  Please make every effort to keep your scheduled appointment time, as your physician needs the test results prior to your next appointment.  If you have to cancel or reschedule, please do so as soon as possible, so that another patient may use that testing time slot.  If you cancel your appointment, you may also need to reschedule your referring doctor's appointment until the test and results can be completed.  Please call [phone] and ask for Dr. Newton's assistant if you need to do so.  If you have any questions at any time please let us know.  We look forward to working with you!! 

## 2019-05-10 NOTE — Progress Notes (Signed)
Subjective:    Patient ID: Ryan Rice, male    DOB: 1951/01/30, 68 y.o.   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 General activity 6 Relation with others 6 Enjoyment of life 5 What TIME of day is your pain at its worst? daytime Sleep (in general) Good  Pain is worse with: walking Pain improves with: rest and medication Relief from Meds: 5  Mobility walk with assistance use a walker how many minutes can you walk? long peroid of time ability to climb steps?  yes do you drive?  yes  Function disabled: date disabled 2002 retired  Neuro/Psych trouble walking  Prior Studies Any changes since last visit?  no  Physicians involved in your care Primary care Halford Chessman Neurologist Susa Simmonds   Family History  Problem Relation Age of Onset  . Stroke Mother   . Heart disease Father   . Diabetes Father   . Diabetes Sister   . Diabetes Brother   . Heart disease Brother    Social History   Socioeconomic History  . Marital status: Married    Spouse name: Not on file  . Number of children: Not on file  . Years of education: Not on file  . Highest education level: Not on file  Occupational History  . Not on file  Social Needs  . Financial resource strain: Not on file  . Food insecurity    Worry: Not on file    Inability: Not on file  . Transportation needs    Medical: Not on file    Non-medical: Not on file  Tobacco Use  . Smoking status: Former Smoker    Quit date: 03/07/1984    Years since quitting: 35.1  . Smokeless tobacco: Former Systems developer    Quit date: 03/07/2013  . Tobacco comment: N/A  Substance and Sexual Activity  . Alcohol use: Yes   Comment: rare use  . Drug use: Never  . Sexual activity: Not on file  Lifestyle  . Physical activity    Days per week: Not on file    Minutes per session: Not on file  . Stress: Not on file  Relationships  . Social Herbalist on phone: Not on file    Gets together: Not on file    Attends religious service: Not on file    Active member of club or organization: Not on file    Attends meetings of clubs or organizations: Not on file    Relationship status: Not on file  Other Topics Concern  . Not on file  Social History Narrative  . Not on file   Past Surgical History:  Procedure Laterality Date  . KIDNEY TRANSPLANT     Past Medical History:  Diagnosis Date  . Allergic rhinitis   . BPH (benign prostatic hyperplasia)   . Chronic atrial fibrillation   . Diabetes mellitus ([place])   . DVT (deep venous thrombosis) (HCC)    LLE  . Hematochezia   . HTN (hypertension)   . Hypomagnesemia 03/20/2019  . Kidney transplant recipient   . PVD (peripheral vascular disease) (HCC)    BP (!) 146/77  Pulse 71   Temp 98.2 F (36.8 C)   Ht 5\' 11"  (1.803 m)   Wt 175 lb 12.8 oz (79.7 kg)   SpO2 98%   BMI 24.52 kg/m   Opioid Risk Score:   Fall Risk Score:  `1  Depression screen PHQ 2/9  Depression screen PHQ 2/9 03/28/2019  Decreased Interest 0  Down, Depressed, Hopeless 0  PHQ - 2 Score 0  Altered sleeping 0  Change in appetite 0  Feeling bad or failure about yourself  0  Trouble concentrating 0  Moving slowly or fidgety/restless 0  Suicidal thoughts 0  PHQ-9 Score 0     Review of Systems  Constitutional: Negative.   HENT: Negative.   Eyes: Negative.   Respiratory: Negative.   Cardiovascular: Negative.   Gastrointestinal: Negative.   Endocrine: Negative.   Genitourinary: Negative.   Musculoskeletal: Positive for gait problem.  Skin: Negative.   Allergic/Immunologic: Negative.   Hematological: Negative.   Psychiatric/Behavioral: Negative.   All other systems  reviewed and are negative.      Objective:   Physical Exam Vitals signs and nursing note reviewed.  Constitutional:      Appearance: Normal appearance.  Neurological:     General: No focal deficit present.     Mental Status: He is alert and oriented to person, place, and time.  Psychiatric:        Mood and Affect: Mood normal.        Behavior: Behavior normal.    Lungs are clear Heart regular rate and rhythm no rubs murmurs exercise Abdomen positive bowel sounds soft nontender palpation Extremities no clubbing cyanosis or edema Motor strength is 4/5 bilateral finger flexors and hand intrinsics 5 bilateral biceps for right triceps 5 left triceps 5 bilateral deltoid 5 bilateral hip flexor knee extensor ankle dorsiflexor Deep tendon reflexes right tricep 3+ right bicep and brachioradialis 0 Left bicep tricep brachioradialis 1+ Right patellar 1+ left patellar 1+ right Achilles 0 left Achilles 0 Sensation intact light touch as well as pinprick bilateral upper limbs. No pain with cervical spine range of motion   There is atrophy in the thenar eminence on the right side as well as the first dorsal webspace bilaterally.  There is no wasting over the forearm area.    Assessment & Plan:  1.  Debility after respiratory failure after bilateral pneumonia.  Overall has recovered well from this. 2.  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

## 2019-05-11 ENCOUNTER — Other Ambulatory Visit: Payer: Self-pay | Admitting: Physical Medicine and Rehabilitation

## 2019-05-17 ENCOUNTER — Other Ambulatory Visit: Payer: Self-pay | Admitting: Physical Medicine and Rehabilitation

## 2019-05-22 ENCOUNTER — Other Ambulatory Visit: Payer: Self-pay | Admitting: Physical Medicine and Rehabilitation

## 2019-05-27 ENCOUNTER — Other Ambulatory Visit: Payer: Self-pay | Admitting: Physical Medicine and Rehabilitation

## 2019-05-30 ENCOUNTER — Encounter: Payer: Medicare HMO | Admitting: Physical Medicine & Rehabilitation

## 2019-05-31 ENCOUNTER — Ambulatory Visit: Payer: Medicare HMO | Admitting: Physical Medicine & Rehabilitation

## 2019-06-10 ENCOUNTER — Other Ambulatory Visit: Payer: Self-pay | Admitting: Physical Medicine & Rehabilitation

## 2019-06-13 ENCOUNTER — Other Ambulatory Visit: Payer: Self-pay

## 2019-06-13 ENCOUNTER — Encounter: Payer: Self-pay | Admitting: Physical Medicine & Rehabilitation

## 2019-06-13 ENCOUNTER — Encounter: Payer: Medicare HMO | Attending: Registered Nurse | Admitting: Physical Medicine & Rehabilitation

## 2019-06-13 VITALS — BP 153/78 | HR 80 | Temp 98.5°F | Ht 71.0 in | Wt 179.2 lb

## 2019-06-13 DIAGNOSIS — R5381 Other malaise: Secondary | ICD-10-CM | POA: Insufficient documentation

## 2019-06-13 DIAGNOSIS — H1132 Conjunctival hemorrhage, left eye: Secondary | ICD-10-CM | POA: Diagnosis present

## 2019-06-13 DIAGNOSIS — R29898 Other symptoms and signs involving the musculoskeletal system: Secondary | ICD-10-CM

## 2019-06-13 NOTE — Progress Notes (Signed)
EMG performed today please see the scanned report in the media section under chart review

## 2019-06-13 NOTE — Patient Instructions (Signed)
EMG was performed today.  Follow-up visit to discuss results and further work-up and treatment.

## 2019-06-19 ENCOUNTER — Other Ambulatory Visit: Payer: Self-pay | Admitting: Physical Medicine & Rehabilitation

## 2019-06-23 ENCOUNTER — Other Ambulatory Visit: Payer: Self-pay | Admitting: Physical Medicine and Rehabilitation

## 2019-06-27 ENCOUNTER — Encounter: Payer: Medicare HMO | Attending: Registered Nurse | Admitting: Physical Medicine & Rehabilitation

## 2019-06-27 ENCOUNTER — Encounter: Payer: Self-pay | Admitting: Physical Medicine & Rehabilitation

## 2019-06-27 ENCOUNTER — Other Ambulatory Visit: Payer: Self-pay

## 2019-06-27 VITALS — BP 133/77 | HR 78 | Temp 97.7°F | Ht 71.0 in | Wt 179.0 lb

## 2019-06-27 DIAGNOSIS — R5381 Other malaise: Secondary | ICD-10-CM | POA: Insufficient documentation

## 2019-06-27 DIAGNOSIS — M47816 Spondylosis without myelopathy or radiculopathy, lumbar region: Secondary | ICD-10-CM

## 2019-06-27 DIAGNOSIS — H1132 Conjunctival hemorrhage, left eye: Secondary | ICD-10-CM | POA: Insufficient documentation

## 2019-06-27 NOTE — Patient Instructions (Signed)
You have a peripheral neuropathy and your main risk factor is diabetes.  THere are several other potential causes and you may take a copy to your neurologist for further evaluation.

## 2019-06-27 NOTE — Progress Notes (Signed)
Subjective:    Patient ID: Ryan Rice, male    DOB: [birth year], 68 y.o.   MRN: [id number]  HPI 68 year old male who was seen admission initially as a hospital follow up from his inpatient rehab hospitalization.  He had debility after pneumonia.  He was noted to have severe hand intrinsic weakness as well as atrophy and therefore underwent EMG/NCV evaluation on 06/13/2019. We reviewed his EMG/NCV report, the patient has no evidence of median or ulnar compressive neuropathy Given the peripheral sensory involvement, this did not appear to be radicular.  The patient has a history of diabetes although he states it is well controlled.  He states that he has some Pain Inventory Average Pain 7 Pain Right Now 7 My pain is aching  In the last 24 hours, has pain interfered with the following? General activity 7 Relation with others 7 Enjoyment of life 7 What TIME of day is your pain at its worst? daytime Sleep (in general) Fair  Pain is worse with: walking Pain improves with: rest Relief from Meds: 0  Mobility walk without assistance ability to climb steps?  yes do you drive?  yes  Function retired  Neuro/Psych tingling trouble walking  Prior Studies Any changes since last visit?  no  Physicians involved in your care Any changes since last visit?  no   Family History  Problem Relation Age of Onset  . Stroke Mother   . Heart disease Father   . Diabetes Father   . Diabetes Sister   . Diabetes Brother   . Heart disease Brother    Social History   Socioeconomic History  . Marital status: Married    Spouse name: Not on file  . Number of children: Not on file  . Years of education: Not on file  . Highest education level: Not on file  Occupational History  . Not on file  Social Needs  . Financial resource strain: Not on file  . Food insecurity    Worry: Not on file    Inability: Not on file  . Transportation needs    Medical: Not on file    Non-medical: Not on  file  Tobacco Use  . Smoking status: Former Smoker    Quit date: 03/07/1984    Years since quitting: 35.3  . Smokeless tobacco: Former Systems developer    Quit date: 03/07/2013  . Tobacco comment: N/A  Substance and Sexual Activity  . Alcohol use: Yes    Comment: rare use  . Drug use: Never  . Sexual activity: Not on file  Lifestyle  . Physical activity    Days per week: Not on file    Minutes per session: Not on file  . Stress: Not on file  Relationships  . Social Herbalist on phone: Not on file    Gets together: Not on file    Attends religious service: Not on file    Active member of club or organization: Not on file    Attends meetings of clubs or organizations: Not on file    Relationship status: Not on file  Other Topics Concern  . Not on file  Social History Narrative  . Not on file   Past Surgical History:  Procedure Laterality Date  . KIDNEY TRANSPLANT     Past Medical History:  Diagnosis Date  . Allergic rhinitis   . BPH (benign prostatic hyperplasia)   . Chronic atrial fibrillation ([place])   . Diabetes mellitus ([place])   .  DVT (deep venous thrombosis) (HCC)    LLE  . Hematochezia   . HTN (hypertension)   . Hypomagnesemia 03/20/2019  . Kidney transplant recipient   . PVD (peripheral vascular disease) (HCC)    BP 133/77   Pulse 78   Temp 97.7 F (36.5 C)   Ht 5\' 11"  (1.803 m)   Wt 179 lb (81.2 kg)   SpO2 97%   BMI 24.97 kg/m   Opioid Risk Score:   Fall Risk Score:  `1  Depression screen PHQ 2/9  Depression screen PHQ 2/9 03/28/2019  Decreased Interest 0  Down, Depressed, Hopeless 0  PHQ - 2 Score 0  Altered sleeping 0  Change in appetite 0  Feeling bad or failure about yourself  0  Trouble concentrating 0  Moving slowly or fidgety/restless 0  Suicidal thoughts 0  PHQ-9 Score 0     Review of Systems  Constitutional: Positive for unexpected weight change.  HENT: Negative.   Eyes: Negative.   Respiratory: Negative.   Cardiovascular:  Negative.   Gastrointestinal: Negative.   Endocrine: Negative.   Genitourinary: Negative.   Musculoskeletal: Positive for gait problem.  Skin: Negative.   Allergic/Immunologic: Negative.   Hematological: Negative.   Psychiatric/Behavioral: Negative.   All other systems reviewed and are negative.      Objective:   Physical Exam  No pain with hip ROM on left mild pain with palp of lateral hip   There is tenderness in the lumbar paraspinals on the left side. There is tenderness at the PSIS area but this is mild. Lumbar flexion is 50% lumbar extension is 25%.  There is more pain with extension than with flexion. Motor strength is 5/5 bilateral hip flexor knee extensor ankle dorsiflexor Sensation is reduced to pinprick in the feet and below the knees bilaterally.  There is no decreased sensation to pinprick in the hands.  There is thenar as well as hyperthenar atrophy in both hands also hand intrinsic atrophy.  No forearm atrophy.  No fasciculations. Deep tendon reflexes are 1+ bilateral biceps 2+ left triceps 1+ right tricep 0 bilateral patellar 0 bilateral Achilles    Assessment & Plan:  1.  Primary complaint of left-sided low back and hip pain.  Lumbar spine x-rays from 03/13/2019 reviewed there is L4-5 L5-S1 lumbar spondylosis bilaterally. He has no pain with hip range of motion.  He has no pain with weightbearing in the hip region.  Symptoms appear to be most related to his lumbar spondylosis without myelopathy.  We will schedule for L3-L4 medial branch L5 dorsal ramus injections. 2.  Hand intrinsic atrophy as well as foot sensory loss with EMG most consistent with peripheral neuropathy.  We discussed that most likely cause would be diabetes despite his good control.  He denies any significant alcohol use in the last 10 years but did drink heavily prior to that time.  We discussed that further work-up can be pursued mainly with blood test looking for vitamin deficiencies autoimmune  diseases and other potential causes.  He states that this is really not bothering him much right now but he will take the EMG report to his neurologist at [facility] next time he sees him.

## 2019-06-28 ENCOUNTER — Other Ambulatory Visit: Payer: Self-pay | Admitting: Physical Medicine & Rehabilitation

## 2019-06-28 NOTE — Telephone Encounter (Signed)
Needs to be refilled and managed by patients primary care provider. 

## 2019-07-04 ENCOUNTER — Other Ambulatory Visit: Payer: Self-pay | Admitting: Physical Medicine and Rehabilitation

## 2019-07-17 ENCOUNTER — Encounter: Payer: Medicare HMO | Admitting: Physical Medicine & Rehabilitation

## 2019-07-26 ENCOUNTER — Encounter (HOSPITAL_BASED_OUTPATIENT_CLINIC_OR_DEPARTMENT_OTHER): Payer: Medicare HMO | Admitting: Physical Medicine & Rehabilitation

## 2019-07-26 ENCOUNTER — Other Ambulatory Visit: Payer: Self-pay

## 2019-07-26 ENCOUNTER — Encounter: Payer: Self-pay | Admitting: Physical Medicine & Rehabilitation

## 2019-07-26 VITALS — BP 149/83 | HR 57 | Temp 97.7°F | Ht 71.0 in | Wt 183.0 lb

## 2019-07-26 DIAGNOSIS — M47816 Spondylosis without myelopathy or radiculopathy, lumbar region: Secondary | ICD-10-CM | POA: Diagnosis not present

## 2019-07-26 DIAGNOSIS — R5381 Other malaise: Secondary | ICD-10-CM | POA: Diagnosis not present

## 2019-07-26 NOTE — Progress Notes (Signed)
Left Lumbar L3, L4  medial branch blocks and L 5 dorsal ramus injection under fluoroscopic guidance   Indication: Left Lumbar pain which is not relieved by medication management or other conservative care and interfering with self-care and mobility.  Informed consent was obtained after describing risks and benefits of the procedure with the patient, this includes bleeding, bruising, infection, paralysis and medication side effects.  The patient wishes to proceed and has given written consent.  The patient was placed in a prone position.  The lumbar area was marked and prepped with Betadine.  One mL of 1% lidocaine was injected into each of 3 areas into the skin and subcutaneous tissue.  Then a 22-gauge 3.5in spinal needle was inserted targeting the junction of the left S1 superior articular process and sacral ala junction.  Needle was advanced under fluoroscopic guidance.  Bone contact was made. Isovue 200 was injected x 0.5 mL demonstrating no intravascular uptake.  Then a solution containing  2% MPF lidocaine was injected x 0.5 mL.  Then the left L5 superior articular process in transverse process junction was targeted.  Bone contact was made. Isovue 200 was injected x 0.5 mL demonstrating no intravascular uptake.  Then a solution containing 2% MPF lidocaine was injected x 0.5 mL.  Then the left L4 superior articular process in transverse process junction was targeted.  Bone contact was made.  Isovue 200 was injected x 0.5 mL demonstrating no intravascular uptake.  Then a solution containing  2% MPF lidocaine was injected x 0.5 mL.  Patient tolerated procedure well.  Post procedure instructions were given. 

## 2019-07-26 NOTE — Progress Notes (Signed)
  PROCEDURE RECORD [facility]   Name: Mitchal Lauth DOB:04/01/1951 MRN: [id number]  Date:07/26/2019  Physician: Alysia Penna, MD    Nurse/CMA: Bright, CMA  Allergies:  Allergies  Allergen Reactions  . Iodinated Diagnostic Agents Hives  . Tape Hives    Consent Signed: Yes.    Is patient diabetic? Yes.    CBG today? 131  Pregnant: No. LMP: No LMP for male patient. (age 69-55)  Anticoagulants: yes (coumadin) Anti-inflammatory: no Antibiotics: no  Procedure: left L3-5 medial branch block  Position: Prone   Start Time: 1016am End Time: 1022am Fluoro Time: 23s  RN/CMA Lanaiya Lantry, CMA Bright, CMA    Time 10:00am 1027am    BP 149/83 144/77    Pulse 57 60    Respirations 14 14    O2 Sat 98 98    S/S 6 6    Pain Level 6/10 3/10     D/C home with wife, patient A & O X 3, D/C instructions reviewed, and sits independently.

## 2019-07-26 NOTE — Patient Instructions (Signed)

## 2019-08-05 ENCOUNTER — Other Ambulatory Visit: Payer: Self-pay | Admitting: Physical Medicine & Rehabilitation

## 2019-08-05 NOTE — Telephone Encounter (Signed)
Patient recieved a 1 time courtesy refill from this office of this medication, atorvastatin, any further refills were to be refilled and managed by patients primary care providers office.

## 2019-08-27 ENCOUNTER — Encounter: Payer: Medicare HMO | Admitting: Physical Medicine & Rehabilitation

## 2019-12-29 ENCOUNTER — Encounter (HOSPITAL_BASED_OUTPATIENT_CLINIC_OR_DEPARTMENT_OTHER): Payer: Self-pay | Admitting: Emergency Medicine

## 2019-12-29 ENCOUNTER — Other Ambulatory Visit: Payer: Self-pay

## 2019-12-29 ENCOUNTER — Emergency Department (HOSPITAL_BASED_OUTPATIENT_CLINIC_OR_DEPARTMENT_OTHER): Payer: Medicare HMO

## 2019-12-29 ENCOUNTER — Inpatient Hospital Stay (HOSPITAL_BASED_OUTPATIENT_CLINIC_OR_DEPARTMENT_OTHER)
Admission: EM | Admit: 2019-12-29 | Discharge: 2020-01-10 | DRG: 064 | Disposition: A | Discharge status: Rehabilitation Facility | Payer: Medicare HMO | Attending: Family Medicine | Admitting: Family Medicine

## 2019-12-29 DIAGNOSIS — N179 Acute kidney failure, unspecified: Secondary | ICD-10-CM | POA: Diagnosis present

## 2019-12-29 DIAGNOSIS — Z823 Family history of stroke: Secondary | ICD-10-CM

## 2019-12-29 DIAGNOSIS — R410 Disorientation, unspecified: Secondary | ICD-10-CM | POA: Diagnosis not present

## 2019-12-29 DIAGNOSIS — W06XXXA Fall from bed, initial encounter: Secondary | ICD-10-CM | POA: Diagnosis present

## 2019-12-29 DIAGNOSIS — Z833 Family history of diabetes mellitus: Secondary | ICD-10-CM

## 2019-12-29 DIAGNOSIS — I69320 Aphasia following cerebral infarction: Secondary | ICD-10-CM

## 2019-12-29 DIAGNOSIS — H409 Unspecified glaucoma: Secondary | ICD-10-CM | POA: Diagnosis present

## 2019-12-29 DIAGNOSIS — M25551 Pain in right hip: Secondary | ICD-10-CM | POA: Diagnosis present

## 2019-12-29 DIAGNOSIS — N401 Enlarged prostate with lower urinary tract symptoms: Secondary | ICD-10-CM | POA: Diagnosis present

## 2019-12-29 DIAGNOSIS — E1159 Type 2 diabetes mellitus with other circulatory complications: Secondary | ICD-10-CM

## 2019-12-29 DIAGNOSIS — R29700 NIHSS score 0: Secondary | ICD-10-CM | POA: Diagnosis present

## 2019-12-29 DIAGNOSIS — I63512 Cerebral infarction due to unspecified occlusion or stenosis of left middle cerebral artery: Principal | ICD-10-CM | POA: Diagnosis present

## 2019-12-29 DIAGNOSIS — N39498 Other specified urinary incontinence: Secondary | ICD-10-CM | POA: Diagnosis present

## 2019-12-29 DIAGNOSIS — E1151 Type 2 diabetes mellitus with diabetic peripheral angiopathy without gangrene: Secondary | ICD-10-CM | POA: Diagnosis present

## 2019-12-29 DIAGNOSIS — N182 Chronic kidney disease, stage 2 (mild): Secondary | ICD-10-CM | POA: Diagnosis present

## 2019-12-29 DIAGNOSIS — Z20822 Contact with and (suspected) exposure to covid-19: Secondary | ICD-10-CM | POA: Diagnosis present

## 2019-12-29 DIAGNOSIS — E875 Hyperkalemia: Secondary | ICD-10-CM | POA: Diagnosis present

## 2019-12-29 DIAGNOSIS — Z8249 Family history of ischemic heart disease and other diseases of the circulatory system: Secondary | ICD-10-CM

## 2019-12-29 DIAGNOSIS — E119 Type 2 diabetes mellitus without complications: Secondary | ICD-10-CM

## 2019-12-29 DIAGNOSIS — R972 Elevated prostate specific antigen [PSA]: Secondary | ICD-10-CM | POA: Diagnosis present

## 2019-12-29 DIAGNOSIS — Z91041 Radiographic dye allergy status: Secondary | ICD-10-CM

## 2019-12-29 DIAGNOSIS — G9389 Other specified disorders of brain: Secondary | ICD-10-CM

## 2019-12-29 DIAGNOSIS — Z940 Kidney transplant status: Secondary | ICD-10-CM

## 2019-12-29 DIAGNOSIS — Z87891 Personal history of nicotine dependence: Secondary | ICD-10-CM

## 2019-12-29 DIAGNOSIS — Z7984 Long term (current) use of oral hypoglycemic drugs: Secondary | ICD-10-CM

## 2019-12-29 DIAGNOSIS — I482 Chronic atrial fibrillation, unspecified: Secondary | ICD-10-CM | POA: Diagnosis present

## 2019-12-29 DIAGNOSIS — D496 Neoplasm of unspecified behavior of brain: Secondary | ICD-10-CM | POA: Diagnosis present

## 2019-12-29 DIAGNOSIS — Z79899 Other long term (current) drug therapy: Secondary | ICD-10-CM

## 2019-12-29 DIAGNOSIS — R233 Spontaneous ecchymoses: Secondary | ICD-10-CM | POA: Diagnosis present

## 2019-12-29 DIAGNOSIS — Z91048 Other nonmedicinal substance allergy status: Secondary | ICD-10-CM

## 2019-12-29 DIAGNOSIS — G8191 Hemiplegia, unspecified affecting right dominant side: Secondary | ICD-10-CM | POA: Diagnosis present

## 2019-12-29 DIAGNOSIS — E785 Hyperlipidemia, unspecified: Secondary | ICD-10-CM | POA: Diagnosis present

## 2019-12-29 DIAGNOSIS — Z794 Long term (current) use of insulin: Secondary | ICD-10-CM

## 2019-12-29 DIAGNOSIS — G936 Cerebral edema: Secondary | ICD-10-CM | POA: Diagnosis present

## 2019-12-29 DIAGNOSIS — M25572 Pain in left ankle and joints of left foot: Secondary | ICD-10-CM | POA: Diagnosis present

## 2019-12-29 DIAGNOSIS — E1122 Type 2 diabetes mellitus with diabetic chronic kidney disease: Secondary | ICD-10-CM | POA: Diagnosis present

## 2019-12-29 DIAGNOSIS — I6340 Cerebral infarction due to embolism of unspecified cerebral artery: Secondary | ICD-10-CM | POA: Insufficient documentation

## 2019-12-29 DIAGNOSIS — J309 Allergic rhinitis, unspecified: Secondary | ICD-10-CM | POA: Diagnosis present

## 2019-12-29 DIAGNOSIS — I129 Hypertensive chronic kidney disease with stage 1 through stage 4 chronic kidney disease, or unspecified chronic kidney disease: Secondary | ICD-10-CM | POA: Diagnosis present

## 2019-12-29 DIAGNOSIS — Z7901 Long term (current) use of anticoagulants: Secondary | ICD-10-CM

## 2019-12-29 DIAGNOSIS — K5900 Constipation, unspecified: Secondary | ICD-10-CM | POA: Diagnosis not present

## 2019-12-29 LAB — COMPREHENSIVE METABOLIC PANEL
ALT: 15 U/L (ref 0–44)
AST: 18 U/L (ref 15–41)
Albumin: 3.7 g/dL (ref 3.5–5.0)
Alkaline Phosphatase: 64 U/L (ref 38–126)
Anion gap: 11 (ref 5–15)
BUN: 31 mg/dL — ABNORMAL HIGH (ref 8–23)
CO2: 18 mmol/L — ABNORMAL LOW (ref 22–32)
Calcium: 11.2 mg/dL — ABNORMAL HIGH (ref 8.9–10.3)
Chloride: 109 mmol/L (ref 98–111)
Creatinine, Ser: 1.75 mg/dL — ABNORMAL HIGH (ref 0.61–1.24)
GFR calc Af Amer: 45 mL/min — ABNORMAL LOW (ref >60)
GFR calc non Af Amer: 39 mL/min — ABNORMAL LOW (ref >60)
Glucose, Bld: 287 mg/dL — ABNORMAL HIGH (ref 70–99)
Potassium: 4.1 mmol/L (ref 3.5–5.1)
Sodium: 138 mmol/L (ref 135–145)
Total Bilirubin: 1.3 mg/dL — ABNORMAL HIGH (ref 0.3–1.2)
Total Protein: 6.9 g/dL (ref 6.5–8.1)

## 2019-12-29 LAB — CBC WITH DIFFERENTIAL/PLATELET
Abs Immature Granulocytes: 0.02 10*3/uL (ref 0.00–0.07)
Basophils Absolute: 0 10*3/uL (ref 0.0–0.1)
Basophils Relative: 0 %
Eosinophils Absolute: 0.1 10*3/uL (ref 0.0–0.5)
Eosinophils Relative: 1 %
HCT: 41.7 % (ref 39.0–52.0)
Hemoglobin: 13 g/dL (ref 13.0–17.0)
Immature Granulocytes: 0 %
Lymphocytes Relative: 9 %
Lymphs Abs: 0.5 10*3/uL — ABNORMAL LOW (ref 0.7–4.0)
MCH: 27 pg (ref 26.0–34.0)
MCHC: 31.2 g/dL (ref 30.0–36.0)
MCV: 86.5 fL (ref 80.0–100.0)
Monocytes Absolute: 0.5 10*3/uL (ref 0.1–1.0)
Monocytes Relative: 9 %
Neutro Abs: 4.6 10*3/uL (ref 1.7–7.7)
Neutrophils Relative %: 81 %
Platelets: 113 10*3/uL — ABNORMAL LOW (ref 150–400)
RBC: 4.82 MIL/uL (ref 4.22–5.81)
RDW: 15.7 % — ABNORMAL HIGH (ref 11.5–15.5)
WBC: 5.8 10*3/uL (ref 4.0–10.5)
nRBC: 0 % (ref 0.0–0.2)

## 2019-12-29 LAB — URINALYSIS, MICROSCOPIC (REFLEX)

## 2019-12-29 LAB — URINALYSIS, ROUTINE W REFLEX MICROSCOPIC
Bilirubin Urine: NEGATIVE
Glucose, UA: 250 mg/dL — AB
Hgb urine dipstick: NEGATIVE
Ketones, ur: NEGATIVE mg/dL
Leukocytes,Ua: NEGATIVE
Nitrite: NEGATIVE
Protein, ur: 30 mg/dL — AB
Specific Gravity, Urine: >1.03 — ABNORMAL HIGH (ref 1.005–1.030)
pH: 6 (ref 5.0–8.0)

## 2019-12-29 LAB — PROTIME-INR
INR: 1.1 (ref 0.8–1.2)
Prothrombin Time: 13.9 seconds (ref 11.4–15.2)

## 2019-12-29 MED ORDER — SODIUM CHLORIDE 0.9 % IV BOLUS
500.0000 mL | Freq: Once | INTRAVENOUS | Status: AC
  Administered 2019-12-29: 500 mL via INTRAVENOUS

## 2019-12-29 NOTE — ED Provider Notes (Signed)
[facility] EMERGENCY DEPARTMENT Provider Note   CSN: [id number] Arrival date & time: 12/29/19  1856     History Chief Complaint  Patient presents with  . Hip Pain  . Altered Mental Status    Ryan Rice is a 69 y.o. male.  The history is provided by the patient, medical records and a relative. No language interpreter was used.  Hip Pain  Altered Mental Status  Ryan Rice is a 69 y.o. male who presents to the Emergency Department complaining of leg pain and confusion.  Level V caveat due to confusion. Per patient he reports that he is here for leg pain. His wife states that he is here for confusion. On history by patient he complains of pain to the left ankle that began in January.  No known injuries.  Pain is worse with walking.  He also has pain in his left hip.  No current pain.  He does have some swelling to the left ankle.  He did have an unwitnessed fall this yesterday.  He states that he fell out of bed.    Wife is concerned for him not acting like himself since yesterday.  He stares off into space at times, has briefer answers to questions than normal.  He is less talkative.  Family is concerned for stroke.     He denies fevers, chest, sob, N/V/D, dysuria.  He has occasional abdominal discomfort.  He does feel confused.  No numbness, weakness.  He has afib and takes coumadin but has been off of it for seven days for planned prostate biopsy next week at [facility].      Past Medical History:  Diagnosis Date  . Allergic rhinitis   . BPH (benign prostatic hyperplasia)   . Chronic atrial fibrillation ([place])   . Diabetes mellitus ([place])   . DVT (deep venous thrombosis) ([facility])    LLE  . Hematochezia   . HTN (hypertension)   . Hypomagnesemia 03/20/2019  . Kidney transplant recipient   . PVD (peripheral vascular disease) [facility])     Patient Active Problem List   Diagnosis Date Noted  . Hypomagnesemia 03/20/2019  . Diabetes ([place]) 03/15/2019  . Glaucoma  03/15/2019  . Scleral hemorrhage of left eye 03/15/2019  . Gout flare 03/15/2019  . Debility 03/08/2019  . Acute renal failure (ARF) ([place])   . CAP (community acquired pneumonia)   . Kidney transplant recipient   . Sepsis ([place]) 02/27/2019    Past Surgical History:  Procedure Laterality Date  . KIDNEY TRANSPLANT         Family History  Problem Relation Age of Onset  . Stroke Mother   . Heart disease Father   . Diabetes Father   . Diabetes Sister   . Diabetes Brother   . Heart disease Brother     Social History   Tobacco Use  . Smoking status: Former Smoker    Quit date: 03/07/1984    Years since quitting: 35.8  . Smokeless tobacco: Former Systems developer    Quit date: 03/07/2013  . Tobacco comment: N/A  Substance Use Topics  . Alcohol use: Yes    Comment: rare use  . Drug use: Never    Home Medications Prior to Admission medications   Medication Sig Start Date End Date Taking? Authorizing Provider  amLODipine (NORVASC) 5 MG tablet Take 1 tablet (5 mg total) by mouth daily. 03/09/19   Cherylann Ratel A, DO  artificial tears (LACRILUBE) OINT ophthalmic ointment Place into the  left eye at bedtime as needed for dry eyes. 03/15/19   Love, Ivan Anchors, PA-C  atorvastatin (LIPITOR) 10 MG tablet TAKE 1 TABLET (10 MG TOTAL) BY MOUTH DAILY AT 6 PM. 05/27/19   Kirsteins, Luanna Salk, MD  benzonatate (TESSALON) 100 MG capsule Take 1 capsule (100 mg total) by mouth 3 (three) times daily as needed for cough. 03/08/19   Cherylann Ratel A, DO  dorzolamide-timolol (COSOPT) 22.3-6.8 MG/ML ophthalmic solution Place 1 drop into the right eye 2 (two) times a day. 01/19/16   [provider]  famotidine (PEPCID) 20 MG tablet Take 1 tablet (20 mg total) by mouth daily. 03/15/19   Love, Ivan Anchors, PA-C  folic acid (FOLVITE) 1 MG tablet TAKE 1 TABLET BY MOUTH EVERY DAY 05/27/19   Kirsteins, Luanna Salk, MD  ketotifen (ZADITOR) 0.025 % ophthalmic solution Place 1 drop into both eyes 2 (two) times daily. 03/15/19   Love,  Ivan Anchors, PA-C  labetalol (NORMODYNE) 100 MG tablet Take 1 tablet (100 mg total) by mouth 2 (two) times daily. 03/08/19   Cherylann Ratel A, DO  magnesium oxide (MAG-OX) 400 MG tablet Take 400 mg by mouth daily.    [provider]  metFORMIN (GLUCOPHAGE) 500 MG tablet Take 1 tablet (500 mg total) by mouth 2 (two) times daily with a meal. 03/15/19   Love, Ivan Anchors, PA-C  mycophenolate (MYFORTIC) 180 MG EC tablet Take 360 mg by mouth 2 (two) times daily.     [provider]  predniSONE (DELTASONE) 10 MG tablet Take 10 mg by mouth daily with breakfast.    [provider]  sulfamethoxazole-trimethoprim (BACTRIM) 400-80 MG tablet Take 1 tablet by mouth every Monday, Wednesday, and Friday.     [provider]  tacrolimus (PROGRAF) 1 MG capsule Take 2 mg by mouth 2 (two) times daily.     [provider]  tamsulosin (FLOMAX) 0.4 MG CAPS capsule Take 0.4 mg by mouth daily after breakfast.     [provider]  thiamine 250 MG tablet Take 1 tablet (250 mg total) by mouth daily. 03/09/19   Cherylann Ratel A, DO  warfarin (COUMADIN) 5 MG tablet Take 2.5-5 mg by mouth daily. Take 2.5mg  on MON TUES THUR FRI SAT, then take 5mg  on WED and SUN.    [provider]    Allergies    Iodinated diagnostic agents and Tape  Review of Systems   Review of Systems  All other systems reviewed and are negative.   Physical Exam Updated Vital Signs BP (!) 168/84   Pulse 89   Temp 99.2 F (37.3 C) (Oral)   Resp 16   Ht 5\' 11"  (1.803 m)   Wt 86.6 kg   SpO2 96%   BMI 26.64 kg/m   Physical Exam Vitals and nursing note reviewed.  Constitutional:      Appearance: He is well-developed.  HENT:     Head: Normocephalic and atraumatic.  Cardiovascular:     Rate and Rhythm: Normal rate and regular rhythm.  Pulmonary:     Effort: Pulmonary effort is normal. No respiratory distress.  Abdominal:     Palpations: Abdomen is soft.     Tenderness: There is no abdominal  tenderness. There is no guarding or rebound.  Musculoskeletal:        General: No tenderness.     Comments: 2+ DP pulses bilaterally.  There is mild tenderness to the left ankle without significant edema, flexion/extension intact.  Normal ROM of left  hip without pain.    Skin:    General: Skin is warm and dry.  Neurological:     Mental Status: He is alert and oriented to person, place, and time.     Comments: No asymmetry of facial movements.  Visual fields grossly intact.  5/5 strength in all four extremities with sensation to light touch intact in all four extremities.    Psychiatric:        Behavior: Behavior normal.     ED Results / Procedures / Treatments   Labs (all labs ordered are listed, but only abnormal results are displayed) Labs Reviewed  COMPREHENSIVE METABOLIC PANEL - Abnormal; Notable for the following components:      Result Value   CO2 18 (*)    Glucose, Bld 287 (*)    BUN 31 (*)    Creatinine, Ser 1.75 (*)    Calcium 11.2 (*)    Total Bilirubin 1.3 (*)    GFR calc non Af Amer 39 (*)    GFR calc Af Amer 45 (*)    All other components within normal limits  CBC WITH DIFFERENTIAL/PLATELET - Abnormal; Notable for the following components:   RDW 15.7 (*)    Platelets 113 (*)    Lymphs Abs 0.5 (*)    All other components within normal limits  URINALYSIS, ROUTINE W REFLEX MICROSCOPIC - Abnormal; Notable for the following components:   Specific Gravity, Urine >1.030 (*)    Glucose, UA 250 (*)    Protein, ur 30 (*)    All other components within normal limits  URINALYSIS, MICROSCOPIC (REFLEX) - Abnormal; Notable for the following components:   Bacteria, UA FEW (*)    All other components within normal limits  SARS CORONAVIRUS 2 (TAT 6-24 HRS)  PROTIME-INR    EKG EKG Interpretation  Date/Time:  Sunday December 29 2019 20:11:22 EDT Ventricular Rate:  77 PR Interval:    QRS Duration: 103 QT Interval:  366 QTC Calculation: 415 R Axis:   63 Text  Interpretation: Atrial fibrillation Minimal ST depression, inferior leads Baseline wander in lead(s) II III aVF V4 Confirmed by Quintella Reichert [phone]) on 12/29/2019 8:32:21 PM   Radiology DG Ankle Complete Left  Result Date: 12/29/2019 CLINICAL DATA:  Pain status post fall EXAM: LEFT ANKLE COMPLETE - 3+ VIEW COMPARISON:  None. FINDINGS: There is no acute displaced fracture. No dislocation. There are degenerative changes of the mortise joint. Advanced vascular calcifications are noted. IMPRESSION: No acute displaced fracture or dislocation. Degenerative changes of the mortise joint. Advanced vascular calcifications. Electronically Signed   By: Constance Holster M.D.   On: 12/29/2019 20:46   CT Head Wo Contrast  Result Date: 12/29/2019 CLINICAL DATA:  Confusion EXAM: CT HEAD WITHOUT CONTRAST TECHNIQUE: Contiguous axial images were obtained from the base of the skull through the vertex without intravenous contrast. COMPARISON:  None. FINDINGS: Brain: There is edema within the left frontal lobe concerning for underlying frontal lobe mass lesion with surrounding edema. No midline shift or hemorrhage. Vascular: No hyperdense vessel or unexpected calcification. Skull: No acute calvarial abnormality. Sinuses/Orbits: Visualized paranasal sinuses and mastoids clear. Orbital soft tissues unremarkable. Other: None IMPRESSION: Edema within the anterior left frontal lobe concerning for vasogenic edema surrounding and underlying mass lesion. Recommend further evaluation with MRI with and without contrast. Electronically Signed   By: Rolm Baptise M.D.   On: 12/29/2019 20:50   DG Hip Unilat W or Wo Pelvis 2-3 Views Left  Result Date: 12/29/2019 CLINICAL DATA:  Pain status post fall EXAM: DG HIP (WITH OR WITHOUT PELVIS) 2-3V LEFT COMPARISON:  None. FINDINGS: There are end-stage degenerative changes of both hips, left worse than right. There is no displaced fracture. No dislocation. Vascular calcifications are noted.  IMPRESSION: 1. No displaced fracture or dislocation. 2. End-stage degenerative changes of both hips, left worse than right. Electronically Signed   By: Constance Holster M.D.   On: 12/29/2019 20:45    Procedures Procedures (including critical care time)  Medications Ordered in ED Medications  sodium chloride 0.9 % bolus 500 mL (0 mLs Intravenous Stopped 12/29/19 2245)    ED Course  I have reviewed the triage vital signs and the nursing notes.  Pertinent labs & imaging results that were available during my care of the patient were reviewed by me and considered in my medical decision making (see chart for details).    MDM Rules/Calculators/A&P                     Patient with history of atrial fibrillation here for evaluation of increased confusion since yesterday. He is slow to answer questions but has a non-focal neurologic examination. CT head is concerning for possible mass. Discussed with Dr. Leonel Ramsay with neurology, recommends MRI to further evaluate. He does have AKI and has a history of renal transplant - will provide fluid hydration in the emergency department. Will start with MRI brain without contrast to see if this gives [place] the needed information, he may need to go back to MRI to get imaging with contrast for further delineation of CT scan findings. Discussed with Dr. Wilson Singer in the [facility] emergency department, who accepts the patient in transfer. Patient and wife updated findings of studies and recommendation for transfer and they are in agreement with treatment plan.  Final Clinical Impression(s) / ED Diagnoses Final diagnoses:  Confusion    Rx / DC Orders ED Discharge Orders    None       Quintella Reichert, MD 12/30/19 0006

## 2019-12-29 NOTE — ED Notes (Signed)
ED Provider at bedside. 

## 2019-12-29 NOTE — ED Triage Notes (Addendum)
Pt c/o LLE that started yesterday w/ no injury; pain is in hip; wife also sts pt has been slow to respond since yesterday; sts he was different and not as talkative yesterday; also had a fall last night that required help to get up; he did not hit head

## 2019-12-29 NOTE — ED Notes (Signed)
Pt transfer ED to ED via POV. Pts IV secure, pt being taken to ED for MRI by wife. .Transfer instructions have been discussed with patient and/or family members. Pt verbally acknowledges understanding, and endorses comprehension to checkout at registration before leaving.

## 2019-12-30 ENCOUNTER — Emergency Department (HOSPITAL_COMMUNITY): Payer: Medicare HMO

## 2019-12-30 ENCOUNTER — Inpatient Hospital Stay (HOSPITAL_COMMUNITY): Payer: Medicare HMO

## 2019-12-30 DIAGNOSIS — I639 Cerebral infarction, unspecified: Secondary | ICD-10-CM | POA: Diagnosis not present

## 2019-12-30 DIAGNOSIS — R0989 Other specified symptoms and signs involving the circulatory and respiratory systems: Secondary | ICD-10-CM | POA: Diagnosis not present

## 2019-12-30 DIAGNOSIS — N401 Enlarged prostate with lower urinary tract symptoms: Secondary | ICD-10-CM | POA: Diagnosis present

## 2019-12-30 DIAGNOSIS — E875 Hyperkalemia: Secondary | ICD-10-CM | POA: Diagnosis present

## 2019-12-30 DIAGNOSIS — M25552 Pain in left hip: Secondary | ICD-10-CM | POA: Diagnosis present

## 2019-12-30 DIAGNOSIS — Z940 Kidney transplant status: Secondary | ICD-10-CM | POA: Diagnosis not present

## 2019-12-30 DIAGNOSIS — I749 Embolism and thrombosis of unspecified artery: Secondary | ICD-10-CM | POA: Diagnosis not present

## 2019-12-30 DIAGNOSIS — Z8249 Family history of ischemic heart disease and other diseases of the circulatory system: Secondary | ICD-10-CM | POA: Diagnosis not present

## 2019-12-30 DIAGNOSIS — R29700 NIHSS score 0: Secondary | ICD-10-CM | POA: Diagnosis present

## 2019-12-30 DIAGNOSIS — R233 Spontaneous ecchymoses: Secondary | ICD-10-CM | POA: Diagnosis present

## 2019-12-30 DIAGNOSIS — E785 Hyperlipidemia, unspecified: Secondary | ICD-10-CM | POA: Diagnosis present

## 2019-12-30 DIAGNOSIS — D72819 Decreased white blood cell count, unspecified: Secondary | ICD-10-CM | POA: Diagnosis not present

## 2019-12-30 DIAGNOSIS — D696 Thrombocytopenia, unspecified: Secondary | ICD-10-CM | POA: Diagnosis not present

## 2019-12-30 DIAGNOSIS — Z20822 Contact with and (suspected) exposure to covid-19: Secondary | ICD-10-CM | POA: Diagnosis present

## 2019-12-30 DIAGNOSIS — K5900 Constipation, unspecified: Secondary | ICD-10-CM | POA: Diagnosis not present

## 2019-12-30 DIAGNOSIS — E1165 Type 2 diabetes mellitus with hyperglycemia: Secondary | ICD-10-CM | POA: Diagnosis not present

## 2019-12-30 DIAGNOSIS — D72828 Other elevated white blood cell count: Secondary | ICD-10-CM | POA: Diagnosis not present

## 2019-12-30 DIAGNOSIS — R739 Hyperglycemia, unspecified: Secondary | ICD-10-CM | POA: Diagnosis not present

## 2019-12-30 DIAGNOSIS — G936 Cerebral edema: Secondary | ICD-10-CM | POA: Diagnosis present

## 2019-12-30 DIAGNOSIS — I482 Chronic atrial fibrillation, unspecified: Secondary | ICD-10-CM | POA: Diagnosis present

## 2019-12-30 DIAGNOSIS — R972 Elevated prostate specific antigen [PSA]: Secondary | ICD-10-CM | POA: Diagnosis present

## 2019-12-30 DIAGNOSIS — D72829 Elevated white blood cell count, unspecified: Secondary | ICD-10-CM | POA: Diagnosis not present

## 2019-12-30 DIAGNOSIS — J309 Allergic rhinitis, unspecified: Secondary | ICD-10-CM | POA: Diagnosis present

## 2019-12-30 DIAGNOSIS — Z823 Family history of stroke: Secondary | ICD-10-CM | POA: Diagnosis not present

## 2019-12-30 DIAGNOSIS — E871 Hypo-osmolality and hyponatremia: Secondary | ICD-10-CM | POA: Diagnosis not present

## 2019-12-30 DIAGNOSIS — R410 Disorientation, unspecified: Secondary | ICD-10-CM | POA: Diagnosis present

## 2019-12-30 DIAGNOSIS — I63512 Cerebral infarction due to unspecified occlusion or stenosis of left middle cerebral artery: Secondary | ICD-10-CM | POA: Diagnosis present

## 2019-12-30 DIAGNOSIS — Z833 Family history of diabetes mellitus: Secondary | ICD-10-CM | POA: Diagnosis not present

## 2019-12-30 DIAGNOSIS — T380X5A Adverse effect of glucocorticoids and synthetic analogues, initial encounter: Secondary | ICD-10-CM | POA: Diagnosis not present

## 2019-12-30 DIAGNOSIS — N39498 Other specified urinary incontinence: Secondary | ICD-10-CM | POA: Diagnosis present

## 2019-12-30 DIAGNOSIS — D496 Neoplasm of unspecified behavior of brain: Secondary | ICD-10-CM | POA: Diagnosis present

## 2019-12-30 DIAGNOSIS — G8191 Hemiplegia, unspecified affecting right dominant side: Secondary | ICD-10-CM | POA: Diagnosis present

## 2019-12-30 DIAGNOSIS — R609 Edema, unspecified: Secondary | ICD-10-CM | POA: Diagnosis not present

## 2019-12-30 DIAGNOSIS — N179 Acute kidney failure, unspecified: Secondary | ICD-10-CM | POA: Diagnosis present

## 2019-12-30 DIAGNOSIS — N182 Chronic kidney disease, stage 2 (mild): Secondary | ICD-10-CM | POA: Diagnosis present

## 2019-12-30 DIAGNOSIS — E1122 Type 2 diabetes mellitus with diabetic chronic kidney disease: Secondary | ICD-10-CM | POA: Diagnosis present

## 2019-12-30 DIAGNOSIS — M25551 Pain in right hip: Secondary | ICD-10-CM | POA: Diagnosis present

## 2019-12-30 DIAGNOSIS — I63132 Cerebral infarction due to embolism of left carotid artery: Secondary | ICD-10-CM | POA: Diagnosis not present

## 2019-12-30 DIAGNOSIS — W06XXXA Fall from bed, initial encounter: Secondary | ICD-10-CM | POA: Diagnosis present

## 2019-12-30 DIAGNOSIS — I6389 Other cerebral infarction: Secondary | ICD-10-CM | POA: Diagnosis not present

## 2019-12-30 DIAGNOSIS — M25572 Pain in left ankle and joints of left foot: Secondary | ICD-10-CM | POA: Diagnosis present

## 2019-12-30 DIAGNOSIS — I10 Essential (primary) hypertension: Secondary | ICD-10-CM | POA: Diagnosis not present

## 2019-12-30 DIAGNOSIS — N390 Urinary tract infection, site not specified: Secondary | ICD-10-CM | POA: Diagnosis not present

## 2019-12-30 DIAGNOSIS — G9389 Other specified disorders of brain: Secondary | ICD-10-CM

## 2019-12-30 DIAGNOSIS — H409 Unspecified glaucoma: Secondary | ICD-10-CM | POA: Diagnosis present

## 2019-12-30 DIAGNOSIS — E1151 Type 2 diabetes mellitus with diabetic peripheral angiopathy without gangrene: Secondary | ICD-10-CM | POA: Diagnosis present

## 2019-12-30 DIAGNOSIS — R2689 Other abnormalities of gait and mobility: Secondary | ICD-10-CM | POA: Diagnosis not present

## 2019-12-30 DIAGNOSIS — I129 Hypertensive chronic kidney disease with stage 1 through stage 4 chronic kidney disease, or unspecified chronic kidney disease: Secondary | ICD-10-CM | POA: Diagnosis present

## 2019-12-30 LAB — BASIC METABOLIC PANEL
Anion gap: 10 (ref 5–15)
BUN: 28 mg/dL — ABNORMAL HIGH (ref 8–23)
CO2: 18 mmol/L — ABNORMAL LOW (ref 22–32)
Calcium: 11 mg/dL — ABNORMAL HIGH (ref 8.9–10.3)
Chloride: 109 mmol/L (ref 98–111)
Creatinine, Ser: 1.37 mg/dL — ABNORMAL HIGH (ref 0.61–1.24)
GFR calc Af Amer: >60 mL/min (ref >60)
GFR calc non Af Amer: 52 mL/min — ABNORMAL LOW (ref >60)
Glucose, Bld: 279 mg/dL — ABNORMAL HIGH (ref 70–99)
Potassium: 4.7 mmol/L (ref 3.5–5.1)
Sodium: 137 mmol/L (ref 135–145)

## 2019-12-30 LAB — CBG MONITORING, ED: Glucose-Capillary: 198 mg/dL — ABNORMAL HIGH (ref 70–99)

## 2019-12-30 LAB — SARS CORONAVIRUS 2 (TAT 6-24 HRS): SARS Coronavirus 2: NEGATIVE

## 2019-12-30 LAB — HEMOGLOBIN A1C
Hgb A1c MFr Bld: 7.1 % — ABNORMAL HIGH (ref 4.8–5.6)
Mean Plasma Glucose: 157.07 mg/dL

## 2019-12-30 LAB — GLUCOSE, CAPILLARY
Glucose-Capillary: 211 mg/dL — ABNORMAL HIGH (ref 70–99)
Glucose-Capillary: 251 mg/dL — ABNORMAL HIGH (ref 70–99)

## 2019-12-30 MED ORDER — DORZOLAMIDE HCL-TIMOLOL MAL 2-0.5 % OP SOLN
1.0000 [drp] | Freq: Two times a day (BID) | OPHTHALMIC | Status: DC
  Administered 2019-12-30 – 2020-01-10 (×22): 1 [drp] via OPHTHALMIC
  Filled 2019-12-30: qty 10

## 2019-12-30 MED ORDER — CINACALCET HCL 30 MG PO TABS
60.0000 mg | ORAL_TABLET | Freq: Every day | ORAL | Status: DC
  Administered 2019-12-30 – 2020-01-10 (×11): 60 mg via ORAL
  Filled 2019-12-30 (×13): qty 2

## 2019-12-30 MED ORDER — ONDANSETRON HCL 4 MG/2ML IJ SOLN: 4.0000 mg | Freq: Four times a day (QID) | INTRAMUSCULAR | Status: DC | PRN

## 2019-12-30 MED ORDER — SULFAMETHOXAZOLE-TRIMETHOPRIM 400-80 MG PO TABS
1.0000 | ORAL_TABLET | ORAL | Status: DC
  Administered 2019-12-30 – 2020-01-10 (×6): 1 via ORAL
  Filled 2019-12-30 (×6): qty 1

## 2019-12-30 MED ORDER — INSULIN ASPART 100 UNIT/ML ~~LOC~~ SOLN
0.0000 [IU] | Freq: Three times a day (TID) | SUBCUTANEOUS | Status: DC
  Administered 2019-12-30: 3 [IU] via SUBCUTANEOUS
  Administered 2019-12-30: 5 [IU] via SUBCUTANEOUS
  Administered 2019-12-31 (×2): 8 [IU] via SUBCUTANEOUS
  Administered 2020-01-01: 12:00:00 5 [IU] via SUBCUTANEOUS
  Administered 2020-01-01 (×2): 3 [IU] via SUBCUTANEOUS
  Administered 2020-01-02: 2 [IU] via SUBCUTANEOUS
  Administered 2020-01-02 – 2020-01-03 (×2): 3 [IU] via SUBCUTANEOUS
  Administered 2020-01-03: 5 [IU] via SUBCUTANEOUS
  Administered 2020-01-03: 3 [IU] via SUBCUTANEOUS
  Administered 2020-01-04: 12:00:00 2 [IU] via SUBCUTANEOUS
  Administered 2020-01-04 (×2): 3 [IU] via SUBCUTANEOUS
  Administered 2020-01-05: 13:00:00 5 [IU] via SUBCUTANEOUS
  Administered 2020-01-05: 3 [IU] via SUBCUTANEOUS
  Administered 2020-01-05 – 2020-01-06 (×2): 8 [IU] via SUBCUTANEOUS
  Administered 2020-01-06: 12:00:00 5 [IU] via SUBCUTANEOUS
  Administered 2020-01-06: 3 [IU] via SUBCUTANEOUS
  Administered 2020-01-07: 2 [IU] via SUBCUTANEOUS
  Administered 2020-01-07: 13:00:00 5 [IU] via SUBCUTANEOUS
  Administered 2020-01-07: 19:00:00 3 [IU] via SUBCUTANEOUS
  Administered 2020-01-08 (×3): 5 [IU] via SUBCUTANEOUS
  Administered 2020-01-09: 3 [IU] via SUBCUTANEOUS
  Administered 2020-01-09: 2 [IU] via SUBCUTANEOUS
  Administered 2020-01-10: 3 [IU] via SUBCUTANEOUS

## 2019-12-30 MED ORDER — POLYETHYLENE GLYCOL 3350 17 G PO PACK: 17.0000 g | PACK | Freq: Every day | ORAL | Status: DC | PRN

## 2019-12-30 MED ORDER — ATORVASTATIN CALCIUM 10 MG PO TABS
10.0000 mg | ORAL_TABLET | Freq: Every day | ORAL | Status: DC
  Administered 2019-12-30 – 2020-01-03 (×3): 10 mg via ORAL
  Filled 2019-12-30 (×3): qty 1

## 2019-12-30 MED ORDER — GADOBUTROL 1 MMOL/ML IV SOLN
9.0000 mL | Freq: Once | INTRAVENOUS | Status: AC | PRN
  Administered 2019-12-30: 9 mL via INTRAVENOUS

## 2019-12-30 MED ORDER — MYCOPHENOLATE SODIUM 180 MG PO TBEC
360.0000 mg | DELAYED_RELEASE_TABLET | Freq: Two times a day (BID) | ORAL | Status: DC
  Administered 2019-12-30 – 2020-01-10 (×22): 360 mg via ORAL
  Filled 2019-12-30 (×25): qty 2

## 2019-12-30 MED ORDER — MORPHINE SULFATE (PF) 2 MG/ML IV SOLN: 2.0000 mg | INTRAVENOUS | Status: DC | PRN

## 2019-12-30 MED ORDER — LACTATED RINGERS IV SOLN
INTRAVENOUS | Status: DC
  Administered 2019-12-30: 12:00:00 1000 mL via INTRAVENOUS

## 2019-12-30 MED ORDER — TACROLIMUS 1 MG PO CAPS
2.0000 mg | ORAL_CAPSULE | Freq: Two times a day (BID) | ORAL | Status: DC
  Administered 2019-12-30 – 2020-01-10 (×22): 2 mg via ORAL
  Filled 2019-12-30 (×24): qty 2

## 2019-12-30 MED ORDER — AMLODIPINE BESYLATE 5 MG PO TABS: 5.0000 mg | ORAL_TABLET | Freq: Every day | ORAL | Status: DC

## 2019-12-30 MED ORDER — ONDANSETRON HCL 4 MG PO TABS: 4.0000 mg | ORAL_TABLET | Freq: Four times a day (QID) | ORAL | Status: DC | PRN

## 2019-12-30 MED ORDER — INSULIN ASPART 100 UNIT/ML ~~LOC~~ SOLN
0.0000 [IU] | Freq: Every day | SUBCUTANEOUS | Status: DC
  Administered 2019-12-30: 3 [IU] via SUBCUTANEOUS
  Administered 2019-12-31 – 2020-01-05 (×3): 2 [IU] via SUBCUTANEOUS
  Administered 2020-01-07: 22:00:00 3 [IU] via SUBCUTANEOUS
  Administered 2020-01-08: 2 [IU] via SUBCUTANEOUS

## 2019-12-30 MED ORDER — TAMSULOSIN HCL 0.4 MG PO CAPS
0.4000 mg | ORAL_CAPSULE | Freq: Every day | ORAL | Status: DC
  Administered 2019-12-30 – 2020-01-10 (×11): 0.4 mg via ORAL
  Filled 2019-12-30 (×11): qty 1

## 2019-12-30 MED ORDER — KETOTIFEN FUMARATE 0.025 % OP SOLN: 1.0000 [drp] | Freq: Two times a day (BID) | OPHTHALMIC | Status: DC

## 2019-12-30 MED ORDER — ACETAMINOPHEN 325 MG PO TABS: 650.0000 mg | ORAL_TABLET | Freq: Four times a day (QID) | ORAL | Status: DC | PRN

## 2019-12-30 MED ORDER — HYDROCODONE-ACETAMINOPHEN 5-325 MG PO TABS: 1.0000 | ORAL_TABLET | ORAL | Status: DC | PRN

## 2019-12-30 MED ORDER — ARTIFICIAL TEARS OPHTHALMIC OINT
TOPICAL_OINTMENT | Freq: Every evening | OPHTHALMIC | Status: DC | PRN
  Filled 2019-12-30: qty 3.5

## 2019-12-30 MED ORDER — MAGNESIUM OXIDE 400 (241.3 MG) MG PO TABS
400.0000 mg | ORAL_TABLET | Freq: Every day | ORAL | Status: DC
  Administered 2019-12-30 – 2020-01-10 (×11): 400 mg via ORAL
  Filled 2019-12-30 (×11): qty 1

## 2019-12-30 MED ORDER — NIFEDIPINE ER OSMOTIC RELEASE 60 MG PO TB24
60.0000 mg | ORAL_TABLET | Freq: Every day | ORAL | Status: DC
  Administered 2019-12-30 – 2020-01-10 (×11): 60 mg via ORAL
  Filled 2019-12-30 (×13): qty 1

## 2019-12-30 MED ORDER — ACETAMINOPHEN 650 MG RE SUPP: 650.0000 mg | Freq: Four times a day (QID) | RECTAL | Status: DC | PRN

## 2019-12-30 MED ORDER — BISACODYL 5 MG PO TBEC: 5.0000 mg | DELAYED_RELEASE_TABLET | Freq: Every day | ORAL | Status: DC | PRN

## 2019-12-30 MED ORDER — THIAMINE HCL 100 MG PO TABS: 250.0000 mg | ORAL_TABLET | Freq: Every day | ORAL | Status: DC

## 2019-12-30 MED ORDER — FINASTERIDE 5 MG PO TABS
5.0000 mg | ORAL_TABLET | Freq: Every day | ORAL | Status: DC
  Administered 2019-12-30 – 2020-01-10 (×11): 5 mg via ORAL
  Filled 2019-12-30 (×11): qty 1

## 2019-12-30 MED ORDER — DEXAMETHASONE SODIUM PHOSPHATE 10 MG/ML IJ SOLN
10.0000 mg | Freq: Once | INTRAMUSCULAR | Status: AC
  Administered 2019-12-30: 10 mg via INTRAVENOUS
  Filled 2019-12-30: qty 1

## 2019-12-30 MED ORDER — LABETALOL HCL 100 MG PO TABS
100.0000 mg | ORAL_TABLET | Freq: Two times a day (BID) | ORAL | Status: DC
  Administered 2019-12-30 – 2020-01-04 (×10): 100 mg via ORAL
  Filled 2019-12-30 (×10): qty 1

## 2019-12-30 MED ORDER — HYDRALAZINE HCL 20 MG/ML IJ SOLN
5.0000 mg | INTRAMUSCULAR | Status: DC | PRN
  Administered 2019-12-30 – 2020-01-03 (×4): 5 mg via INTRAVENOUS
  Filled 2019-12-30 (×4): qty 1

## 2019-12-30 MED ORDER — DOCUSATE SODIUM 100 MG PO CAPS
100.0000 mg | ORAL_CAPSULE | Freq: Two times a day (BID) | ORAL | Status: DC
  Administered 2019-12-30 – 2020-01-10 (×21): 100 mg via ORAL
  Filled 2019-12-30 (×22): qty 1

## 2019-12-30 MED ORDER — ZOLPIDEM TARTRATE 5 MG PO TABS: 5.0000 mg | ORAL_TABLET | Freq: Every evening | ORAL | Status: DC | PRN

## 2019-12-30 MED ORDER — BRIMONIDINE TARTRATE 0.2 % OP SOLN
1.0000 [drp] | Freq: Two times a day (BID) | OPHTHALMIC | Status: DC
  Administered 2019-12-30 – 2020-01-10 (×22): 1 [drp] via OPHTHALMIC
  Filled 2019-12-30: qty 5

## 2019-12-30 MED ORDER — DEXAMETHASONE SODIUM PHOSPHATE 10 MG/ML IJ SOLN
8.0000 mg | Freq: Four times a day (QID) | INTRAMUSCULAR | Status: DC
  Administered 2019-12-30 – 2020-01-08 (×35): 8 mg via INTRAVENOUS
  Filled 2019-12-30 (×24): qty 0.8
  Filled 2019-12-30: qty 1
  Filled 2019-12-30 (×14): qty 0.8

## 2019-12-30 MED ORDER — FOLIC ACID 1 MG PO TABS
1.0000 mg | ORAL_TABLET | Freq: Every day | ORAL | Status: DC
  Administered 2019-12-30 – 2020-01-10 (×11): 1 mg via ORAL
  Filled 2019-12-30 (×11): qty 1

## 2019-12-30 MED ORDER — FAMOTIDINE 20 MG PO TABS
20.0000 mg | ORAL_TABLET | Freq: Every day | ORAL | Status: DC
  Administered 2019-12-30 – 2020-01-10 (×11): 20 mg via ORAL
  Filled 2019-12-30 (×11): qty 1

## 2019-12-30 NOTE — Consult Note (Signed)
Reason for Consult:brain tumor, left frontal Referring Physician: Isais, Ryan Rice is an 69 y.o. male.  HPI: whom by report started to experience mental changes this past Friday. He was brought to the ED for evaluation of his mental status. His wife reports he had difficulty speaking, that his speech was very slow. Noted to have word finding difficulties in the ED. Head CT showed hypodense brain surrounding a hyperdense mass in the left frontal lobe situated along the midline. MRI confirmed the CT revealing a nonenhancing mass in the left frontal lobe with ill defined margins. I have been requested to evaluate and offer recommendations for his treatment. No seizure activity noted on an EEG performed today. I obtained the history from Ryan Rice and the chart.   Past Medical History:  Diagnosis Date  . Allergic rhinitis   . BPH (benign prostatic hyperplasia)   . Chronic atrial fibrillation ([place])   . Diabetes mellitus ([place])   . DVT (deep venous thrombosis) ([facility])    LLE  . Hematochezia   . HTN (hypertension)   . Hypomagnesemia 03/20/2019  . Kidney transplant recipient   . PVD (peripheral vascular disease) ([place])     Past Surgical History:  Procedure Laterality Date  . KIDNEY TRANSPLANT      Family History  Problem Relation Age of Onset  . Stroke Mother   . Heart disease Father   . Diabetes Father   . Diabetes Sister   . Diabetes Brother   . Heart disease Brother     Social History:  reports that he quit smoking about 35 years ago. He quit smokeless tobacco use about 6 years ago. He reports current alcohol use. He reports that he does not use drugs.  Allergies:  Allergies  Allergen Reactions  . Iodinated Diagnostic Agents Hives  . Tape Hives    Medications: I have reviewed the patient's current medications.  Results for orders placed or performed during the hospital encounter of 12/29/19 (from the past 48 hour(s))  Comprehensive metabolic panel     Status:  Abnormal   Collection Time: 12/29/19  8:24 PM  Result Value Ref Range   Sodium 138 135 - 145 mmol/L   Potassium 4.1 3.5 - 5.1 mmol/L   Chloride 109 98 - 111 mmol/L   CO2 18 (L) 22 - 32 mmol/L   Glucose, Bld 287 (H) 70 - 99 mg/dL    Comment: Glucose reference range applies only to samples taken after fasting for at least 8 hours.   BUN 31 (H) 8 - 23 mg/dL   Creatinine, Ser 1.75 (H) 0.61 - 1.24 mg/dL   Calcium 11.2 (H) 8.9 - 10.3 mg/dL   Total Protein 6.9 6.5 - 8.1 g/dL   Albumin 3.7 3.5 - 5.0 g/dL   AST 18 15 - 41 U/L   ALT 15 0 - 44 U/L   Alkaline Phosphatase 64 38 - 126 U/L   Total Bilirubin 1.3 (H) 0.3 - 1.2 mg/dL   GFR calc non Af Amer 39 (L) >60 mL/min   GFR calc Af Amer 45 (L) >60 mL/min   Anion gap 11 5 - 15    Comment: Performed at [facility], [address] [place], [place] [address]  CBC with Differential     Status: Abnormal   Collection Time: 12/29/19  8:24 PM  Result Value Ref Range   WBC 5.8 4.0 - 10.5 K/uL   RBC 4.82 4.22 - 5.81 MIL/uL   Hemoglobin 13.0  13.0 - 17.0 g/dL   HCT 41.7 39.0 - 52.0 %   MCV 86.5 80.0 - 100.0 fL   MCH 27.0 26.0 - 34.0 pg   MCHC 31.2 30.0 - 36.0 g/dL   RDW 15.7 (H) 11.5 - 15.5 %   Platelets 113 (L) 150 - 400 K/uL   nRBC 0.0 0.0 - 0.2 %   Neutrophils Relative % 81 %   Neutro Abs 4.6 1.7 - 7.7 K/uL   Lymphocytes Relative 9 %   Lymphs Abs 0.5 (L) 0.7 - 4.0 K/uL   Monocytes Relative 9 %   Monocytes Absolute 0.5 0.1 - 1.0 K/uL   Eosinophils Relative 1 %   Eosinophils Absolute 0.1 0.0 - 0.5 K/uL   Basophils Relative 0 %   Basophils Absolute 0.0 0.0 - 0.1 K/uL   Smear Review PLATELET COUNT CONFIRMED BY SMEAR    Immature Granulocytes 0 %   Abs Immature Granulocytes 0.02 0.00 - 0.07 K/uL    Comment: Performed at [facility], [address] [place], [place] [address]  Protime-INR     Status: None   Collection Time: 12/29/19  8:24 PM  Result Value Ref Range   Prothrombin Time 13.9 11.4 - 15.2 seconds   INR 1.1  0.8 - 1.2    Comment: (NOTE) INR goal varies based on device and disease states. Performed at [facility], [address] [place], [place] [address]   SARS CORONAVIRUS 2 (TAT 6-24 HRS) Nasopharyngeal Nasopharyngeal Swab     Status: None   Collection Time: 12/29/19  9:18 PM   Specimen: Nasopharyngeal Swab  Result Value Ref Range   SARS Coronavirus 2 NEGATIVE NEGATIVE    Comment: (NOTE) SARS-CoV-2 target nucleic acids are NOT DETECTED. The SARS-CoV-2 RNA is generally detectable in upper and lower respiratory specimens during the acute phase of infection. Negative results do not preclude SARS-CoV-2 infection, do not rule out co-infections with other pathogens, and should not be used as the sole basis for treatment or other patient management decisions. Negative results must be combined with clinical observations, patient history, and epidemiological information. The expected result is Negative. Fact Sheet for Patients: SugarRoll.be Fact Sheet for Healthcare Providers: https://www.woods-mathews.com/ This test is not yet approved or cleared by the [place] FDA and  has been authorized for detection and/or diagnosis of SARS-CoV-2 by FDA under an Emergency Use Authorization (EUA). This EUA will remain  in effect (meaning this test can be used) for the duration of the COVID-19 declaration under Section 56 4(b)(1) of the Act, 21 U.S.C. section 360bbb-3(b)(1), unless the authorization is terminated or revoked sooner. Performed at [facility], [place] [address] [place], [place] [address]   Urinalysis, Routine w reflex microscopic     Status: Abnormal   Collection Time: 12/29/19  9:37 PM  Result Value Ref Range   Color, Urine YELLOW YELLOW   APPearance CLEAR CLEAR   Specific Gravity, Urine >1.030 (H) 1.005 - 1.030   pH 6.0 5.0 - 8.0   Glucose, UA 250 (A) NEGATIVE mg/dL   Hgb urine dipstick NEGATIVE NEGATIVE   Bilirubin  Urine NEGATIVE NEGATIVE   Ketones, ur NEGATIVE NEGATIVE mg/dL   Protein, ur 30 (A) NEGATIVE mg/dL   Nitrite NEGATIVE NEGATIVE   Leukocytes,Ua NEGATIVE NEGATIVE    Comment: Performed at [facility], [address] [place], [place] [address]  Urinalysis, Microscopic (reflex)     Status: Abnormal   Collection Time: 12/29/19  9:37 PM  Result Value Ref Range   RBC / HPF 0-5 0 - 5 RBC/hpf   WBC, UA 0-5 0 - 5 WBC/hpf   Bacteria, UA FEW (A) NONE SEEN   Squamous Epithelial / LPF 0-5 0 - 5   WBC Casts, UA PRESENT    Ca Oxalate Crys, UA PRESENT     Comment: Performed at [facility], [address] [place], [place] [address]  CBG monitoring, ED     Status: Abnormal   Collection Time: 12/30/19 11:48 AM  Result Value Ref Range   Glucose-Capillary 198 (H) 70 - 99 mg/dL    Comment: Glucose reference range applies only to samples taken after fasting for at least 8 hours.  Hemoglobin A1c     Status: Abnormal   Collection Time: 12/30/19 12:03 PM  Result Value Ref Range   Hgb A1c MFr Bld 7.1 (H) 4.8 - 5.6 %    Comment: (NOTE) Pre diabetes:          5.7%-6.4% Diabetes:              >6.4% Glycemic control for   <7.0% adults with diabetes    Mean Plasma Glucose 157.07 mg/dL    Comment: Performed at [facility] [address], [place], [place] [id number]  Basic metabolic panel     Status: Abnormal   Collection Time: 12/30/19  3:47 PM  Result Value Ref Range   Sodium 137 135 - 145 mmol/L   Potassium 4.7 3.5 - 5.1 mmol/L   Chloride 109 98 - 111 mmol/L   CO2 18 (L) 22 - 32 mmol/L   Glucose, Bld 279 (H) 70 - 99 mg/dL    Comment: Glucose reference range applies only to samples taken after fasting for at least 8 hours.   BUN 28 (H) 8 - 23 mg/dL   Creatinine, Ser 1.37 (H) 0.61 - 1.24 mg/dL   Calcium 11.0 (H) 8.9 - 10.3 mg/dL   GFR calc non Af Amer 52 (L) >60 mL/min   GFR calc Af Amer >60 >60 mL/min   Anion gap 10 5 - 15    Comment: Performed at [facility] [address] [place], [place] [address]  Glucose, capillary     Status: Abnormal   Collection Time: 12/30/19  4:36 PM  Result Value Ref Range   Glucose-Capillary 211 (H) 70 - 99 mg/dL    Comment: Glucose reference range applies only to samples taken after fasting for at least 8 hours.    DG Ankle Complete Left  Result Date: 12/29/2019 CLINICAL DATA:  Pain status post fall EXAM: LEFT ANKLE COMPLETE - 3+ VIEW COMPARISON:  None. FINDINGS: There is no acute displaced fracture. No dislocation. There are degenerative changes of the mortise joint. Advanced vascular calcifications are noted. IMPRESSION: No acute displaced fracture or dislocation. Degenerative changes of the mortise joint. Advanced vascular calcifications. Electronically Signed   By: Constance Holster M.D.   On: 12/29/2019 20:46   CT Head Wo Contrast  Result Date: 12/29/2019 CLINICAL DATA:  Confusion EXAM: CT HEAD WITHOUT CONTRAST TECHNIQUE: Contiguous axial images were obtained from the base of the skull through the vertex without intravenous contrast. COMPARISON:  None. FINDINGS: Brain: There is edema within the left frontal lobe concerning for underlying frontal lobe mass lesion with surrounding edema. No midline shift or hemorrhage. Vascular: No hyperdense vessel or unexpected calcification. Skull: No acute calvarial abnormality. Sinuses/Orbits: Visualized paranasal sinuses and mastoids clear. Orbital  soft tissues unremarkable. Other: None IMPRESSION: Edema within the anterior left frontal lobe concerning for vasogenic edema surrounding and underlying mass lesion. Recommend further evaluation with MRI with and without contrast. Electronically Signed   By: Rolm Baptise M.D.   On: 12/29/2019 20:50   MR BRAIN W WO CONTRAST  Result Date: 12/30/2019 CLINICAL DATA:  Encephalopathy. EXAM: MRI HEAD WITHOUT AND WITH CONTRAST TECHNIQUE: Multiplanar, multiecho pulse sequences of the brain and surrounding structures were obtained without and with  intravenous contrast. CONTRAST:  37mL GADAVIST GADOBUTROL 1 MMOL/ML IV SOLN COMPARISON:  Head CT 12/29/2019 FINDINGS: BRAIN: There is a large area of abnormal signal within the left frontal lobe, with areas of restricted diffusion, facilitated diffusion and magnetic susceptibility effect. There is no associated contrast enhancement. There is mass effect on the frontal horn of the left lateral ventricle with 1-2 mm of rightward midline shift of the left cingulate gyrus. No hydrocephalus. 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 Midline structures are normal. There is magnetic susceptibility effect at multiple locations within the left frontal lesion, roughly corresponding to relatively hyperdense areas on the earlier head CT. VASCULAR: Major flow voids are preserved. Susceptibility-sensitive sequences show no chronic microhemorrhage or superficial siderosis. SKULL AND UPPER CERVICAL SPINE: Normal calvarium and skull base. Visualized upper cervical spine and soft tissues are normal. SINUSES/ORBITS: No paranasal sinus fluid levels or advanced mucosal thickening. No mastoid or middle ear effusion. Normal orbits. IMPRESSION: 1. Area of vasogenic edema within the left frontal lobe without associated contrast enhancement. 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 2. Areas of magnetic susceptibility effect within the periphery of the left frontal lobe lesion may indicate venous congestion or late subacute to chronic petechial hemorrhage. 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 Electronically Signed   By: Ulyses Jarred M.D.   On: 12/30/2019 01:48   EEG adult  Result Date: 12/30/2019 Ryan Havens, MD     12/30/2019  5:01 PM Patient Name: Ryan Rice MRN: [id number] Epilepsy Attending: Lora Rice Referring Physician/Provider: Dr. Karmen Bongo Date: 12/30/2019 Duration: 24.26 mins Patient history: 69 year old male who presented for episodes of  transient alteration of awareness. EEG to evaluate for seizures. Level of alertness: Awake AEDs during EEG study: None Technical aspects: This EEG study was done with scalp electrodes positioned according to the 10-20 International system of electrode placement. Electrical activity was acquired at a sampling rate of 500Hz  and reviewed with a high frequency filter of 70Hz  and a low frequency filter of 1Hz . EEG data were recorded continuously and digitally stored. Description: No clear posterior dominant rhythm was seen. EEG showed generalized polymorphic 8-10 hz alpha activity as well as intermittent left frontal 2-3hz  delta slowing. Hyperventilation and photic stimulation were not performed. ABNORMALITY - Intermittent slow, left frontal IMPRESSION: This study is suggestive of cortical dysfunction in left frontal region likely secondary to underlying mass. No seizures or definite epileptiform discharges were seen throughout the recording. Ryan Rice   DG Hip Unilat W or Wo Pelvis 2-3 Views Left  Result Date: 12/29/2019 CLINICAL DATA:  Pain status post fall EXAM: DG HIP (WITH OR WITHOUT PELVIS) 2-3V LEFT COMPARISON:  None. FINDINGS: There are end-stage degenerative changes of both hips, left worse than right. There is no displaced fracture. No dislocation. Vascular calcifications are noted. IMPRESSION: 1. No displaced fracture or dislocation. 2. End-stage degenerative changes of both hips, left worse than right. Electronically Signed   By:  Constance Holster M.D.   On: 12/29/2019 20:45    Review of Systems  Constitutional: Negative.   HENT: Negative.   Eyes: Negative.   Respiratory: Negative.   Cardiovascular: Negative.   Gastrointestinal: Negative.   Endocrine: Negative.   Genitourinary: Negative.   Musculoskeletal: Positive for gait problem.       Left hip pain  Skin: Negative.   Neurological: Positive for speech difficulty.  Hematological: Negative.   Psychiatric/Behavioral: Positive for  confusion.   Blood pressure (!) 167/79, pulse 80, temperature 98.2 F (36.8 C), temperature source Oral, resp. rate 20, height 5\' 11"  (1.803 m), weight 86.6 kg, SpO2 100 %. Physical Exam  Constitutional: He appears well-developed and well-nourished.  HENT:  Head: Normocephalic and atraumatic.  Right Ear: External ear normal.  Left Ear: External ear normal.  Eyes: Pupils are equal, round, and reactive to light. Conjunctivae and EOM are normal.  Neurological: He is alert. He has normal strength and normal reflexes. He displays no Babinski's sign on the right side. He displays no Babinski's sign on the left side.  Expressive aphasia, word finding difficulties. Speech is non fluent at times, much better at other times. Able to name all objects I asked him to.  Proprioception intact in upper and lower extremities Gait not assessed    Assessment/Plan: Ryan Rice is a 69 y.o. male With a newly diagnosed left frontal mass. I will plan on biopsy on Thursday afternoon. His wife expressed desire to have Ryan Rice remain in the hospital until procedure is done.   Ryan Rice 12/30/2019, 7:30 PM

## 2019-12-30 NOTE — H&P (Addendum)
History and Physical    [facility] [id number] DOB: 04-05-1951 DOA: 12/29/2019  PCP: Ryan Marble, PA-C Consultants:  [facility] - endocrinology; [facility] - urology; [place] - cardiology; [facility] - GI; [facility] - nephrology Patient coming from: Home - lives with wife and granddaughter; [facility]: Wife, [phone]; [phone]  Chief Complaint: AMS  HPI: [facility] is a 69 y.o. male with medical history significant of PVD; renal transplant (2013); HTN; DM; and afib presenting with AMS.  He presented to [facility] last night for AMS.  His wife noticed Saturday that he wasn't talking much, was staring.  When asked a question, he would hesitate and stutter.  They were concerned about a ministroke.  He did not seem to have other symptoms.  She saw a [place] of this Friday but Saturday it was much worse.  No headaches or vision changes.  He has noticed expressive aphasia. Occasional tremor.  Intermittent urinary incontinence.   He is usually a talker and less able to do that.  No apparent changes in executive function.  No unintentional weight loss vs. Maybe a few - previously 192.  ?LAD behind knees.  No night sweats.  He is eating and drinking ok.    He also has left hip and leg pain.  This has been going on for a couple of months and is about the same.  It hurts in his hip and radiates down his entire leg.  No back pain.   He is on chronic Coumadin but has been off for about a week due to scheduled prostate biopsy at [facility] on Wednesday.  PSA in 10/2019 was 9.34, up from 6.79 in 05/2019.     ED Course:  Carryover, per Dr. Marlowe [facility]:  Patient presented to [facility] ED for evaluation of altered mental status and left hip plus ankle pain. Head CT showing a brain mass. 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 Findings concerning for neoplasm. No seizure activity reported. Patient is awake and alert  but confused. Creatinine 1.7, baseline 1.1. X-rays of left hip and ankle negative. ED provider discussed the case with Dr. Christella [facility] who recommended starting Decadron, neurosurgery consult in a.m. ED provider will place the order for Decadron.  Review of Systems: As per HPI; otherwise review of systems reviewed and negative.   Ambulatory Status:  Ambulated without assistance prior but recently with a rolling walker due to hip pain/ataxia    Past Medical History:  Diagnosis Date  . Allergic rhinitis   . BPH (benign prostatic hyperplasia)   . Chronic atrial fibrillation ([place])   . Diabetes mellitus ([place])   . DVT (deep venous thrombosis) (HCC)    LLE  . Hematochezia   . HTN (hypertension)   . Hypomagnesemia 03/20/2019  . Kidney transplant recipient   . PVD (peripheral vascular disease) ([place])     Past Surgical History:  Procedure Laterality Date  . KIDNEY TRANSPLANT      Social History   Socioeconomic History  . Marital status: Married    Spouse name: Not on file  . Number of children: Not on file  . Years of education: Not on file  . Highest education level: Not on file  Occupational History  . Not on file  Tobacco Use  . Smoking status: Former Smoker    Quit date: 03/07/1984    Years since quitting: 35.8  . Smokeless tobacco: Former Systems developer  Quit date: 03/07/2013  . Tobacco comment: N/A  Substance and Sexual Activity  . Alcohol use: Yes    Comment: rare use  . Drug use: Never  . Sexual activity: Not on file  Other Topics Concern  . Not on file  Social History Narrative  . Not on file   Social Determinants of Health   Financial Resource Strain:   . Difficulty of Paying Living Expenses:   Food Insecurity:   . Worried About Charity fundraiser in the Last Year:   . Arboriculturist in the Last Year:   Transportation Needs:   . Film/video editor (Medical):   Marland Kitchen Lack of Transportation (Non-Medical):   Physical Activity:   . Days of Exercise per Week:   .  Minutes of Exercise per Session:   Stress:   . Feeling of Stress :   Social Connections:   . Frequency of Communication with Friends and Family:   . Frequency of Social Gatherings with Friends and Family:   . Attends Religious Services:   . Active Member of Clubs or Organizations:   . Attends Archivist Meetings:   Marland Kitchen Marital Status:   Intimate Partner Violence:   . Fear of Current or Ex-Partner:   . Emotionally Abused:   Marland Kitchen Physically Abused:   . Sexually Abused:     Allergies  Allergen Reactions  . Iodinated Diagnostic Agents Hives  . Tape Hives    Family History  Problem Relation Age of Onset  . Stroke Mother   . Heart disease Father   . Diabetes Father   . Diabetes Sister   . Diabetes Brother   . Heart disease Brother     Prior to Admission medications   Medication Sig Start Date End Date Taking? Authorizing Provider  amLODipine (NORVASC) 5 MG tablet Take 1 tablet (5 mg total) by mouth daily. 03/09/19   Cherylann Ratel A, DO  artificial tears (LACRILUBE) OINT ophthalmic ointment Place into the left eye at bedtime as needed for dry eyes. 03/15/19   Love, Ivan Anchors, PA-C  atorvastatin (LIPITOR) 10 MG tablet TAKE 1 TABLET (10 MG TOTAL) BY MOUTH DAILY AT 6 PM. 05/27/19   Kirsteins, Luanna Salk, MD  benzonatate (TESSALON) 100 MG capsule Take 1 capsule (100 mg total) by mouth 3 (three) times daily as needed for cough. 03/08/19   Cherylann Ratel A, DO  dorzolamide-timolol (COSOPT) 22.3-6.8 MG/ML ophthalmic solution Place 1 drop into the right eye 2 (two) times a day. 01/19/16   [provider]  famotidine (PEPCID) 20 MG tablet Take 1 tablet (20 mg total) by mouth daily. 03/15/19   Love, Ivan Anchors, PA-C  folic acid (FOLVITE) 1 MG tablet TAKE 1 TABLET BY MOUTH EVERY DAY 05/27/19   Kirsteins, Luanna Salk, MD  ketotifen (ZADITOR) 0.025 % ophthalmic solution Place 1 drop into both eyes 2 (two) times daily. 03/15/19   Love, Ivan Anchors, PA-C  labetalol (NORMODYNE) 100 MG tablet Take 1  tablet (100 mg total) by mouth 2 (two) times daily. 03/08/19   Cherylann Ratel A, DO  magnesium oxide (MAG-OX) 400 MG tablet Take 400 mg by mouth daily.    [provider]  metFORMIN (GLUCOPHAGE) 500 MG tablet Take 1 tablet (500 mg total) by mouth 2 (two) times daily with a meal. 03/15/19   Love, Ivan Anchors, PA-C  mycophenolate (MYFORTIC) 180 MG EC tablet Take 360 mg by mouth 2 (two) times daily.     [provider]  predniSONE (DELTASONE) 10 MG tablet Take 10 mg by mouth daily with breakfast.    [provider]  sulfamethoxazole-trimethoprim (BACTRIM) 400-80 MG tablet Take 1 tablet by mouth every Monday, Wednesday, and Friday.     [provider]  tacrolimus (PROGRAF) 1 MG capsule Take 2 mg by mouth 2 (two) times daily.     [provider]  tamsulosin (FLOMAX) 0.4 MG CAPS capsule Take 0.4 mg by mouth daily after breakfast.     [provider]  thiamine 250 MG tablet Take 1 tablet (250 mg total) by mouth daily. 03/09/19   Cherylann Ratel A, DO  warfarin (COUMADIN) 5 MG tablet Take 2.5-5 mg by mouth daily. Take 2.5mg  on MON TUES THUR FRI SAT, then take 5mg  on WED and SUN.    [provider]    Physical Exam: Vitals:   12/30/19 0745 12/30/19 0830 12/30/19 0900 12/30/19 1130  BP: (!) 168/76 (!) 163/86 (!) 164/72 (!) 171/79  Pulse: 83 83 90 74  Resp: 17 (!) 22 (!) 21 (!) 21  Temp:      TempSrc:      SpO2: 99% 100% 100% 99%  Weight:      Height:         . General:  Appears calm and comfortable and is NAD; expressive aphasia noted on exam . Eyes:  PERRL, EOMI, normal lids, iris . ENT:  grossly normal hearing, lips & tongue, mmm; poor dentition . Neck:  no LAD, masses or thyromegaly . Cardiovascular:  Irregularly irregular, no m/r/g. No LE edema.  Marland Kitchen Respiratory:   CTA bilaterally with no wheezes/rales/rhonchi.  Normal respiratory effort. . Abdomen:  soft, NT, ND, NABS . Skin:  no rash or induration seen on limited exam . Musculoskeletal:   grossly normal tone BUE/BLE, good ROM, no bony abnormality . Psychiatric:  grossly normal mood and affect, speech appropriate intermixed with expressive aphasia . Neurologic:  CN 2-12 grossly intact, moves all extremities in coordinated fashion    Radiological Exams on Admission: DG Ankle Complete Left  Result Date: 12/29/2019 CLINICAL DATA:  Pain status post fall EXAM: LEFT ANKLE COMPLETE - 3+ VIEW COMPARISON:  None. FINDINGS: There is no acute displaced fracture. No dislocation. There are degenerative changes of the mortise joint. Advanced vascular calcifications are noted. IMPRESSION: No acute displaced fracture or dislocation. Degenerative changes of the mortise joint. Advanced vascular calcifications. Electronically Signed   By: Constance Holster M.D.   On: 12/29/2019 20:46   CT Head Wo Contrast  Result Date: 12/29/2019 CLINICAL DATA:  Confusion EXAM: CT HEAD WITHOUT CONTRAST TECHNIQUE: Contiguous axial images were obtained from the base of the skull through the vertex without intravenous contrast. COMPARISON:  None. FINDINGS: Brain: There is edema within the left frontal lobe concerning for underlying frontal lobe mass lesion with surrounding edema. No midline shift or hemorrhage. Vascular: No hyperdense vessel or unexpected calcification. Skull: No acute calvarial abnormality. Sinuses/Orbits: Visualized paranasal sinuses and mastoids clear. Orbital soft tissues unremarkable. Other: None IMPRESSION: Edema within the anterior left frontal lobe concerning for vasogenic edema surrounding and underlying mass lesion. Recommend further evaluation with MRI with and without contrast. Electronically Signed   By: Rolm Baptise M.D.   On: 12/29/2019 20:50   MR BRAIN W WO CONTRAST  Result Date: 12/30/2019 CLINICAL DATA:  Encephalopathy. EXAM: MRI HEAD WITHOUT AND WITH CONTRAST TECHNIQUE: Multiplanar, multiecho pulse sequences of the brain and surrounding structures were obtained without and with intravenous  contrast. CONTRAST:  35mL GADAVIST GADOBUTROL  1 MMOL/ML IV SOLN COMPARISON:  Head CT 12/29/2019 FINDINGS: BRAIN: There is a large area of abnormal signal within the left frontal lobe, with areas of restricted diffusion, facilitated diffusion and magnetic susceptibility effect. There is no associated contrast enhancement. There is mass effect on the frontal horn of the left lateral ventricle with 1-2 mm of rightward midline shift of the left cingulate gyrus. No hydrocephalus. 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 Midline structures are normal. There is magnetic susceptibility effect at multiple locations within the left frontal lesion, roughly corresponding to relatively hyperdense areas on the earlier head CT. VASCULAR: Major flow voids are preserved. Susceptibility-sensitive sequences show no chronic microhemorrhage or superficial siderosis. SKULL AND [facility] CERVICAL SPINE: Normal calvarium and skull base. Visualized Ryan cervical spine and soft tissues are normal. SINUSES/ORBITS: No paranasal sinus fluid levels or advanced mucosal thickening. No mastoid or middle ear effusion. Normal orbits. IMPRESSION: 1. Area of vasogenic edema within the left frontal lobe without associated contrast enhancement. 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 2. Areas of magnetic susceptibility effect within the periphery of the left frontal lobe lesion may indicate venous congestion or late subacute to chronic petechial hemorrhage. 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 Electronically Signed   By: Ulyses Jarred M.D.   On: 12/30/2019 01:48   DG Hip Unilat W or Wo Pelvis 2-3 Views Left  Result Date: 12/29/2019 CLINICAL DATA:  Pain status post fall EXAM: DG HIP (WITH OR WITHOUT PELVIS) 2-3V LEFT COMPARISON:  None. FINDINGS: There are end-stage degenerative changes of both hips, left worse than right. There is no displaced fracture. No dislocation. Vascular  calcifications are noted. IMPRESSION: 1. No displaced fracture or dislocation. 2. End-stage degenerative changes of both hips, left worse than right. Electronically Signed   By: Constance Holster M.D.   On: 12/29/2019 20:45    EKG: Independently reviewed.  Afib with rate 77; nonspecific ST changes with no evidence of acute ischemia   Labs on Admission: I have personally reviewed the available labs and imaging studies at the time of the admission.  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 Outpatient evaluation -He was scheduled for prostate biopsy this week at [facility] and is off his Coumadin, so the timing is good for inpatient biopsy -They also might decide to f/u at [facility] for the biopsy, vs. Requesting urology consult and biopsy here, if possible -Will request PT/OT/ST consultation -Will order EEG due to staring episodes, although  AED therapy may be indicated regardless -Will hold [facility] for now given possible need for  surgery as well as possible petechial hemorrhages noted on MRI  AKI with h/o renal transplantation -No recent labs since almost a year ago, but currently with significantly worsened creatinine/GFR -This may be associated with decreased PO intake in the setting of recent confusion -Will start MIVF and also allow patient to eat -Recheck BMP in AM -Continue Sensipar -Hold Voltaren gel - consider discontinuation -Given his h/o renal transplantation, it is critical that his renal function be closely monitored and maintained as much as possible -He is on daily immunosuppresant therapy as well as prednisone and so Decadron dose was increased for stress-dosed steroids as well as control of vasogenic edema -For now, continue Myfortic, Prograf  DM -Recent A1c 6.8, indicating good control -Hold PO meds - Januvia, Gluocphage, Starlix, Actos -Will cover with moderate-scale SSI   HTN -Continue Nifedipine, Labetalol  Afib -Rate controlled with labetalol -Coumadin is on hold due to upcoming prostate biopsy -Appears to be stable at this time so will monitor on med surg  Elevated PSA -He is due for prostate biopsy -Ideally, this could also be performed while patient is off Coumadin; however, this is likely less time-sensitive than the brain tumor biopsy and so could be deferred -He is having urinary incontinence (his sheets were soaking wet at the time of my evaluation), which may be related to his BPH, his AMS, or other -Continue Proscar, Flomax -He is taking 3x/week Bactrim, presumably for UTI prophylaxis  L > R hip pain -Imaging indicates end-stage degenerative changes of B but L > R hips -He complains of pain along the left leg as well as gait instability that appears to be related to the L hip -For now, pain management as needed with eventual plan for orthopedics consult as outpatient and likely to need THR  Glaucoma -Continue Alphagan, Cosopt  HLD -Continue Lipitor  Hyperkalemia -May  improve with hydration in the setting of AKI -If not improving, consider bisphosphonate therapy -Will repeat BMP and check ionized calcium now   Note: This patient has been tested and is negative for the novel coronavirus COVID-19.  DVT prophylaxis:  SCDs Code Status:  Full - confirmed with patient/family Family Communication: Wife was present throughout evaluation Disposition Plan: To be determined - will depend on whether the patient proceeds with inpatient brain biopsy.  Will also depend on therapy assessments. Consults called: Neurosurgery; PT/OT/ST Admission status: Admit - It is my clinical opinion that admission to INPATIENT is reasonable and necessary because of the expectation that this patient will require [facility] care that crosses at least 2 midnights to treat this condition based on the medical complexity of the problems presented.  Given the aforementioned information, the predictability of an adverse outcome is felt to be significant.    Karmen Bongo MD [facility]   How to contact the [facility] Attending or Consulting provider [place] or covering provider during after hours [place], for this patient?  1. Check the care team in [facility] and look for a) attending/consulting TR[facility] provider listed and b) the [facility] team listed 2. Log into [email or web address] and use [place]'s universal password to access. If you do not have the password, please contact the [facility] operator. 3. Locate the [facility] provider you are looking for under [facility] and page to a number that you can be directly reached. 4. If you still have difficulty reaching the provider, please page the [facility] (Director  on Call) for the Hospitalists listed on amion for assistance.   12/30/2019, 11:37 AM

## 2019-12-30 NOTE — Progress Notes (Signed)
EEG Completed; Results Pending  

## 2019-12-30 NOTE — ED Provider Notes (Signed)
Patient transferred here from [facility] for MRI brain.  See prior notes for full H&P.  Briefly, 69 y.o. M initially presenting to the ED with pain in left ankle since January and AMS since yesterday.  He underwent CT which was concerning for brain mass with surrounding edema.  He does take coumadin, however has been off this for several days with anticipation of prostate biopsy later this week.  INR today is 1.1.  Plan:  MRI pending.  Results for orders placed or performed during the hospital encounter of 12/29/19  Comprehensive metabolic panel  Result Value Ref Range   Sodium 138 135 - 145 mmol/L   Potassium 4.1 3.5 - 5.1 mmol/L   Chloride 109 98 - 111 mmol/L   CO2 18 (L) 22 - 32 mmol/L   Glucose, Bld 287 (H) 70 - 99 mg/dL   BUN 31 (H) 8 - 23 mg/dL   Creatinine, Ser 1.75 (H) 0.61 - 1.24 mg/dL   Calcium 11.2 (H) 8.9 - 10.3 mg/dL   Total Protein 6.9 6.5 - 8.1 g/dL   Albumin 3.7 3.5 - 5.0 g/dL   AST 18 15 - 41 U/L   ALT 15 0 - 44 U/L   Alkaline Phosphatase 64 38 - 126 U/L   Total Bilirubin 1.3 (H) 0.3 - 1.2 mg/dL   GFR calc non Af Amer 39 (L) >60 mL/min   GFR calc Af Amer 45 (L) >60 mL/min   Anion gap 11 5 - 15  CBC with Differential  Result Value Ref Range   WBC 5.8 4.0 - 10.5 K/uL   RBC 4.82 4.22 - 5.81 MIL/uL   Hemoglobin 13.0 13.0 - 17.0 g/dL   HCT 41.7 39.0 - 52.0 %   MCV 86.5 80.0 - 100.0 fL   MCH 27.0 26.0 - 34.0 pg   MCHC 31.2 30.0 - 36.0 g/dL   RDW 15.7 (H) 11.5 - 15.5 %   Platelets 113 (L) 150 - 400 K/uL   nRBC 0.0 0.0 - 0.2 %   Neutrophils Relative % 81 %   Neutro Abs 4.6 1.7 - 7.7 K/uL   Lymphocytes Relative 9 %   Lymphs Abs 0.5 (L) 0.7 - 4.0 K/uL   Monocytes Relative 9 %   Monocytes Absolute 0.5 0.1 - 1.0 K/uL   Eosinophils Relative 1 %   Eosinophils Absolute 0.1 0.0 - 0.5 K/uL   Basophils Relative 0 %   Basophils Absolute 0.0 0.0 - 0.1 K/uL   Smear Review PLATELET COUNT CONFIRMED BY SMEAR    Immature Granulocytes 0 %   Abs Immature Granulocytes 0.02 0.00 -  0.07 K/uL  Protime-INR  Result Value Ref Range   Prothrombin Time 13.9 11.4 - 15.2 seconds   INR 1.1 0.8 - 1.2  Urinalysis, Routine w reflex microscopic  Result Value Ref Range   Color, Urine YELLOW YELLOW   APPearance CLEAR CLEAR   Specific Gravity, Urine >1.030 (H) 1.005 - 1.030   pH 6.0 5.0 - 8.0   Glucose, UA 250 (A) NEGATIVE mg/dL   Hgb urine dipstick NEGATIVE NEGATIVE   Bilirubin Urine NEGATIVE NEGATIVE   Ketones, ur NEGATIVE NEGATIVE mg/dL   Protein, ur 30 (A) NEGATIVE mg/dL   Nitrite NEGATIVE NEGATIVE   Leukocytes,Ua NEGATIVE NEGATIVE  Urinalysis, Microscopic (reflex)  Result Value Ref Range   RBC / HPF 0-5 0 - 5 RBC/hpf   WBC, UA 0-5 0 - 5 WBC/hpf   Bacteria, UA FEW (A) NONE SEEN   Squamous Epithelial / LPF 0-5  0 - 5   WBC Casts, UA PRESENT    Ca Oxalate Crys, UA PRESENT    DG Ankle Complete Left  Result Date: 12/29/2019 CLINICAL DATA:  Pain status post fall EXAM: LEFT ANKLE COMPLETE - 3+ VIEW COMPARISON:  None. FINDINGS: There is no acute displaced fracture. No dislocation. There are degenerative changes of the mortise joint. Advanced vascular calcifications are noted. IMPRESSION: No acute displaced fracture or dislocation. Degenerative changes of the mortise joint. Advanced vascular calcifications. Electronically Signed   By: Constance Holster M.D.   On: 12/29/2019 20:46   CT Head Wo Contrast  Result Date: 12/29/2019 CLINICAL DATA:  Confusion EXAM: CT HEAD WITHOUT CONTRAST TECHNIQUE: Contiguous axial images were obtained from the base of the skull through the vertex without intravenous contrast. COMPARISON:  None. FINDINGS: Brain: There is edema within the left frontal lobe concerning for underlying frontal lobe mass lesion with surrounding edema. No midline shift or hemorrhage. Vascular: No hyperdense vessel or unexpected calcification. Skull: No acute calvarial abnormality. Sinuses/Orbits: Visualized paranasal sinuses and mastoids clear. Orbital soft tissues unremarkable.  Other: None IMPRESSION: Edema within the anterior left frontal lobe concerning for vasogenic edema surrounding and underlying mass lesion. Recommend further evaluation with MRI with and without contrast. Electronically Signed   By: Rolm Baptise M.D.   On: 12/29/2019 20:50   MR BRAIN W WO CONTRAST  Result Date: 12/30/2019 CLINICAL DATA:  Encephalopathy. EXAM: MRI HEAD WITHOUT AND WITH CONTRAST TECHNIQUE: Multiplanar, multiecho pulse sequences of the brain and surrounding structures were obtained without and with intravenous contrast. CONTRAST:  55mL GADAVIST GADOBUTROL 1 MMOL/ML IV SOLN COMPARISON:  Head CT 12/29/2019 FINDINGS: BRAIN: There is a large area of abnormal signal within the left frontal lobe, with areas of restricted diffusion, facilitated diffusion and magnetic susceptibility effect. There is no associated contrast enhancement. There is mass effect on the frontal horn of the left lateral ventricle with 1-2 mm of rightward midline shift of the left cingulate gyrus. No hydrocephalus. 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 Midline structures are normal. There is magnetic susceptibility effect at multiple locations within the left frontal lesion, roughly corresponding to relatively hyperdense areas on the earlier head CT. VASCULAR: Major flow voids are preserved. Susceptibility-sensitive sequences show no chronic microhemorrhage or superficial siderosis. SKULL AND UPPER CERVICAL SPINE: Normal calvarium and skull base. Visualized upper cervical spine and soft tissues are normal. SINUSES/ORBITS: No paranasal sinus fluid levels or advanced mucosal thickening. No mastoid or middle ear effusion. Normal orbits. IMPRESSION: 1. Area of vasogenic edema within the left frontal lobe without associated contrast enhancement. 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 2. Areas of magnetic susceptibility effect within the periphery of the left frontal lobe lesion  may indicate venous congestion or late subacute to chronic petechial hemorrhage. 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 Electronically Signed   By: Ulyses Jarred M.D.   On: 12/30/2019 01:48   DG Hip Unilat W or Wo Pelvis 2-3 Views Left  Result Date: 12/29/2019 CLINICAL DATA:  Pain status post fall EXAM: DG HIP (WITH OR WITHOUT PELVIS) 2-3V LEFT COMPARISON:  None. FINDINGS: There are end-stage degenerative changes of both hips, left worse than right. There is no displaced fracture. No dislocation. Vascular calcifications are noted. IMPRESSION: 1. No displaced fracture or dislocation. 2. End-stage degenerative changes of both hips, left worse than right. Electronically Signed   By: Constance Holster M.D.   On: 12/29/2019 20:45  MRI unfortunately with findings suggestive of neoplasm with vasogenic edema.  There is also noted question of venous congestion vs late/subacute/chronic petechial hemorrhage.  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  On re-check, patient able to answer questions here.  He seems comfortable.  His wife has gone home for the evening so will try to call and update her with results.  Will discuss with neurosurgery.  Given his AMS, likely warrants admission.  Discussed with Dr. Christella Noa-- can start decadron overnight, he will see patient in AM.  Admit to hospitalist.  I have attempted to call patient's wife, [name], (patient verbalized permission for this), however no answer at number listed in chart.  Will try again later this morning.  I have tried calling home number for patient and wife [phone]) and still no answer.  Discussed with hospitalist, Dr. Marlowe Sax-- we will go ahead and start the decadron.  She will admit for ongoing care.  3:09 AM Have called patient's wife for the 4th time, still no answer at any of the numbers listed.  Further attempts can be made in the morning.   Larene Pickett, PA-C [id number]    Delora Fuel, MD [id number] [phone]

## 2019-12-30 NOTE — Progress Notes (Signed)
BP (!) 157/81   Pulse 86   Temp 99.2 F (37.3 C) (Oral)   Resp (!) 21   Ht 5\' 11"  (1.803 m)   Wt 86.6 kg   SpO2 96%   BMI 26.64 kg/m  Mri reviewed. 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 I will speak with patient and family later today. Nothing emergent at this time, will recommend a biopsy, but that can be planned for the near future. Will not need to remain an inpatient unless that is the patient's strong preference. Would recommend whatever is suggested for hip to be done without regard for the mass. There is no apparent transformation in the mass, remains non contrast enhancing, and most likely has been present for quite some time. Very [place] mass effect. Mass in its totality is not completely resectable.

## 2019-12-30 NOTE — Evaluation (Signed)
Physical Therapy Evaluation Patient Details Name: Ryan Rice MRN: [id number] DOB: 01/27/1951 Today's Date: 12/30/2019   History of Present Illness  69 y.o. male with medical history significant of PVD; renal transplant (2013); HTN; DM; and afib presenting with AMS. Pt reporting some expressive aphasia, tremors, urinary incontinence. Pt also reports L hip and leg pain for a few months. 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  Clinical Impression  Pt presents to PT with deficits in cognition, gait, balance, motor control, safety awareness. Pt with antalgic gait, but this may be due to chronic L hip pain per chart review. Pt disoriented to time and with noted expressive aphasia with apparent word finding deficits during session. Pt with lack of awareness of deficits, reporting he is fine and feels fine during session but has impaired ability to follow multi-step commands among other cognitive deficits. Pt will continue to benefit from acute PT POC to improve gait quality and further reduce falls risk. PT currently recommending discharge home with HHPT and 24/7 supervision due to safety awareness deficits.    Follow Up Recommendations Home health PT;Supervision/Assistance - 24 hour    Equipment Recommendations  None recommended by PT    Recommendations for Other Services       Precautions / Restrictions Precautions Precautions: Fall Restrictions Weight Bearing Restrictions: No      Mobility  Bed Mobility Overal bed mobility: Modified Independent                Transfers Overall transfer level: Needs assistance Equipment used: None Transfers: Sit to/from Stand Sit to Stand: Supervision            Ambulation/Gait Ambulation/Gait assistance: Supervision Gait Distance (Feet): 100 Feet Assistive device: None Gait Pattern/deviations: Step-to pattern;Trunk flexed;Decreased stance time - left Gait velocity: reduced Gait velocity interpretation: <1.8 ft/sec,  indicate of risk for recurrent falls General Gait Details: pt with increased trunk flexion and antalgic gait on LLE. Pt inconsistently following multi-step commands durign ambulation, unable to follow cues to turn around at 2nd diamond on floor and instead bypassing all diamonds and requiring PT cues to stop ambulation.  Stairs            Wheelchair Mobility    Modified Rankin (Stroke Patients Only)       Balance Overall balance assessment: Needs assistance Sitting-balance support: No upper extremity supported;Feet supported Sitting balance-Leahy Scale: Good     Standing balance support: No upper extremity supported Standing balance-Leahy Scale: Good Standing balance comment: supervision for static standing                             Pertinent Vitals/Pain Pain Assessment: No/denies pain    Home Living Family/patient expects to be discharged to:: Private residence Living Arrangements: Spouse/significant other Available Help at Discharge: Available 24 hours/day;Family Type of Home: House Home Access: Stairs to enter;Other (comment) Entrance Stairs-Rails: None Entrance Stairs-Number of Steps: 1STE back door and able ot use RW (main entryway) and 5STE without handrails on front enttrance Home Layout: One level Home Equipment: Walker - 2 wheels      Prior Function Level of Independence: Independent         Comments: enjoys fishing     Hand Dominance   Dominant Hand: Right    Extremity/Trunk Assessment   Upper Extremity Assessment Upper Extremity Assessment: RUE deficits/detail;LUE deficits/detail RUE Coordination: (slow and deliberate finger to nose w/ mild intention tremor) LUE Coordination: (slow and  deliberate finger to nose w/ mild intention tremor)    Lower Extremity Assessment Lower Extremity Assessment: Overall WFL for tasks assessed    Cervical / Trunk Assessment Cervical / Trunk Assessment: Normal  Communication   Communication:  No difficulties  Cognition Arousal/Alertness: Awake/alert Behavior During Therapy: WFL for tasks assessed/performed Overall Cognitive Status: Impaired/Different from baseline Area of Impairment: Orientation;Attention;Memory;Following commands;Safety/judgement;Awareness;Problem solving                 Orientation Level: Disoriented to;Time Current Attention Level: Focused Memory: Decreased recall of precautions;Decreased short-term memory Following Commands: Follows one step commands consistently;Follows multi-step commands inconsistently Safety/Judgement: Decreased awareness of safety;Decreased awareness of deficits Awareness: Emergent Problem Solving: Slow processing;Requires verbal cues;Requires tactile cues General Comments: pt with some noted expressive aphasia, taking time and having difficulty word finding      General Comments General comments (skin integrity, edema, etc.): pt reports having spider webs in his vision since onset of symptoms. Pt reports chronic tingling in RLE with ambulation    Exercises     Assessment/Plan    PT Assessment Patient needs continued PT services  PT Problem List Decreased activity tolerance;Decreased balance;Decreased mobility;Decreased coordination;Decreased cognition;Decreased knowledge of use of DME;Decreased safety awareness;Decreased knowledge of precautions;Pain;Impaired tone       PT Treatment Interventions DME instruction;Gait training;Stair training;Functional mobility training;Therapeutic activities;Therapeutic exercise;Balance training;Neuromuscular re-education;Cognitive remediation;Patient/family education    PT Goals (Current goals can be found in the Care Plan section)  Acute Rehab PT Goals Patient Stated Goal: To go home PT Goal Formulation: With patient Time For Goal Achievement: 01/13/20 Potential to Achieve Goals: Good Additional Goals Additional Goal #1: Pt will maintain dynamic standing balance within 10 inches of  his base of support with use of the least restrictive assistive device and with supervision.    Frequency Min 3X/week   Barriers to discharge        Co-evaluation               AM-PAC PT "6 Clicks" Mobility  Outcome Measure Help needed turning from your back to your side while in a flat bed without using bedrails?: None Help needed moving from lying on your back to sitting on the side of a flat bed without using bedrails?: None Help needed moving to and from a bed to a chair (including a wheelchair)?: None Help needed standing up from a chair using your arms (e.g., wheelchair or bedside chair)?: None Help needed to walk in hospital room?: A [place] Help needed climbing 3-5 steps with a railing? : A Devincent 6 Click Score: 22    End of Session   Activity Tolerance: Patient tolerated treatment well Patient left: in chair;with call bell/phone within reach;with chair alarm set Nurse Communication: Mobility status PT Visit Diagnosis: Other abnormalities of gait and mobility (R26.89);Other symptoms and signs involving the nervous system (R29.898)    Time: [id number] PT Time Calculation (min) (ACUTE ONLY): 16 min   Charges:   PT Evaluation $PT Eval Moderate Complexity: 1 Mod          Zenaida Niece, PT, DPT Acute Rehabilitation Pager: [phone]   Zenaida Niece 12/30/2019, 5:48 PM

## 2019-12-30 NOTE — Procedures (Addendum)
Patient Name: Jimmy Wiland  MRN: [id number]  Epilepsy Attending: Lora Havens  Referring Physician/Provider: Dr. Karmen Bongo Date: 12/30/2019 Duration: 24.26 mins  Patient history: 69 year old male who presented for episodes of transient alteration of awareness. EEG to evaluate for seizures.  Level of alertness: Awake  AEDs during EEG study: None  Technical aspects: This EEG study was done with scalp electrodes positioned according to the 10-20 International system of electrode placement. Electrical activity was acquired at a sampling rate of 500Hz  and reviewed with a high frequency filter of 70Hz  and a low frequency filter of 1Hz . EEG data were recorded continuously and digitally stored.   Description: No clear posterior dominant rhythm was seen. EEG showed generalized polymorphic 8-10 hz alpha activity as well as intermittent left frontal 2-3hz  delta slowing. Hyperventilation and photic stimulation were not performed.  ABNORMALITY - Intermittent slow, left frontal   IMPRESSION: This study is suggestive of cortical dysfunction in left frontal region likely secondary to underlying mass. No seizures or definite epileptiform discharges were seen throughout the recording.  Channie Bostick Barbra Sarks

## 2019-12-31 DIAGNOSIS — D496 Neoplasm of unspecified behavior of brain: Secondary | ICD-10-CM | POA: Diagnosis not present

## 2019-12-31 LAB — GLUCOSE, CAPILLARY
Glucose-Capillary: 256 mg/dL — ABNORMAL HIGH (ref 70–99)
Glucose-Capillary: 236 mg/dL — ABNORMAL HIGH (ref 70–99)
Glucose-Capillary: 255 mg/dL — ABNORMAL HIGH (ref 70–99)
Glucose-Capillary: 304 mg/dL — ABNORMAL HIGH (ref 70–99)
Glucose-Capillary: 230 mg/dL — ABNORMAL HIGH (ref 70–99)

## 2019-12-31 LAB — BASIC METABOLIC PANEL
Anion gap: 13 (ref 5–15)
BUN: 26 mg/dL — ABNORMAL HIGH (ref 8–23)
CO2: 18 mmol/L — ABNORMAL LOW (ref 22–32)
Calcium: 10.8 mg/dL — ABNORMAL HIGH (ref 8.9–10.3)
Chloride: 106 mmol/L (ref 98–111)
Creatinine, Ser: 1.34 mg/dL — ABNORMAL HIGH (ref 0.61–1.24)
GFR calc Af Amer: >60 mL/min (ref >60)
GFR calc non Af Amer: 54 mL/min — ABNORMAL LOW (ref >60)
Glucose, Bld: 261 mg/dL — ABNORMAL HIGH (ref 70–99)
Potassium: 4.3 mmol/L (ref 3.5–5.1)
Sodium: 137 mmol/L (ref 135–145)

## 2019-12-31 LAB — CALCIUM, IONIZED: Calcium, Ionized, Serum: 6 mg/dL — ABNORMAL HIGH (ref 4.5–5.6)

## 2019-12-31 LAB — CBC
HCT: 41.9 % (ref 39.0–52.0)
Hemoglobin: 13.2 g/dL (ref 13.0–17.0)
MCH: 26.6 pg (ref 26.0–34.0)
MCHC: 31.5 g/dL (ref 30.0–36.0)
MCV: 84.5 fL (ref 80.0–100.0)
Platelets: 106 10*3/uL — ABNORMAL LOW (ref 150–400)
RBC: 4.96 MIL/uL (ref 4.22–5.81)
RDW: 15.2 % (ref 11.5–15.5)
WBC: 7 10*3/uL (ref 4.0–10.5)
nRBC: 0 % (ref 0.0–0.2)

## 2019-12-31 MED ORDER — INSULIN GLARGINE 100 UNIT/ML ~~LOC~~ SOLN
14.0000 [IU] | Freq: Every day | SUBCUTANEOUS | Status: DC
  Administered 2019-12-31: 14 [IU] via SUBCUTANEOUS
  Filled 2019-12-31: qty 0.14

## 2019-12-31 MED ORDER — INSULIN GLARGINE 100 UNIT/ML ~~LOC~~ SOLN
20.0000 [IU] | Freq: Every day | SUBCUTANEOUS | Status: DC
  Administered 2020-01-01 – 2020-01-10 (×10): 20 [IU] via SUBCUTANEOUS
  Filled 2019-12-31 (×10): qty 0.2

## 2019-12-31 MED ORDER — INSULIN GLARGINE 100 UNIT/ML ~~LOC~~ SOLN
6.0000 [IU] | Freq: Once | SUBCUTANEOUS | Status: AC
  Administered 2019-12-31: 6 [IU] via SUBCUTANEOUS
  Filled 2019-12-31: qty 0.06

## 2019-12-31 NOTE — Evaluation (Signed)
Speech Language Pathology Evaluation Patient Details Name: Ryan Rice MRN: [id number] DOB: Nov 14, 1950 Today's Date: 12/31/2019 Time:  -     Problem List:  Patient Active Problem List   Diagnosis Date Noted  . Brain tumor ([place]) 12/30/2019  . Hip pain, bilateral 12/30/2019  . Elevated PSA 12/30/2019  . Atrial fibrillation, chronic ([place]) 12/30/2019  . Hypomagnesemia 03/20/2019  . Diabetes ([place]) 03/15/2019  . Glaucoma 03/15/2019  . Scleral hemorrhage of left eye 03/15/2019  . Gout flare 03/15/2019  . Debility 03/08/2019  . AKI (acute kidney injury) ([place])   . CAP (community acquired pneumonia)   . Kidney transplant recipient   . Sepsis ([place]) 02/27/2019   Past Medical History:  Past Medical History:  Diagnosis Date  . Allergic rhinitis   . BPH (benign prostatic hyperplasia)   . Chronic atrial fibrillation ([place])   . Diabetes mellitus ([place])   . DVT (deep venous thrombosis) ([facility])    LLE  . Hematochezia   . HTN (hypertension)   . Hypomagnesemia 03/20/2019  . Kidney transplant recipient   . PVD (peripheral vascular disease) ([place])    Past Surgical History:  Past Surgical History:  Procedure Laterality Date  . KIDNEY TRANSPLANT     HPI:  69 y.o. male with medical history significant of PVD; renal transplant (2013); HTN; DM; and afib presenting with AMS. Pt reporting some expressive aphasia, tremors, urinary incontinence. Pt also reports L hip and leg pain for a few months. 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 Pt reported that he currently is getting home health ST targeting language deficits.    Assessment / Plan / Recommendation Clinical Impression  Pt was seen for a cognitive-linguistic evaluation in the setting of a possible neoplasm.  He reported that he has been receiving home health ST for the past 6 months "working on making [his] speech shorter".  Pt was unable to elaborate further.  Pt reported that he lives with his wife and granddaughter and that his wife  handles most of his IADLs including medications, finances, etc.  Pt had trouble stating the age of his granddaughter when asked.  He initially said that she was 12, then 15, and finally 18.  Portions of the Bedside WAB and the MOCA were administered in addition to informal evaluation.  Pt answered yes/no questions, followed 1-3 step commands, repeated words and sentences, and completed confrontational and responsive naming tasks with 100% accuracy independently, but he was observed to have difficulty with complex receptive language in conversational speech.  He reported that his speech was intermittently dysarthric and that he noticed increased difficulty in reading, writing, and word finding since this admission.  Anomia and verbal perseveration were noted intermittently in conversational speech. Pt's speech was fluent, but he typically communicated in shorter sentences and he was unable to elaborate during picture description tasks.  Pt presents most consistently with Anomic Aphasia and he would benefit from continued ST acutely and at his next level of care.  Pt also exhibited cognitive deficits in the areas of problem solving, attention, and short-term memory.  Of note, this may have been impacted by the pt's language deficits.  Recommend that he continue to receive assistance with IADLs at time of discharge.  SLP will f/u per POC.      SLP Assessment  SLP Recommendation/Assessment: Patient needs continued Speech Lanaguage Pathology Services SLP Visit Diagnosis: Aphasia (R47.01);Cognitive communication deficit (R41.841)    Follow Up Recommendations  Home health SLP    Frequency and  Duration min 2x/week  2 weeks      SLP Evaluation Cognition  Overall Cognitive Status: Impaired/Different from baseline Arousal/Alertness: Awake/alert Orientation Level: Oriented to person;Oriented to place;Oriented to situation;Oriented to time Attention: Sustained Sustained Attention: Impaired Sustained Attention  Impairment: Verbal complex Memory: Impaired Memory Impairment: Decreased short term memory Decreased Short Term Memory: Verbal basic Immediate Memory Recall: Sock;Blue;Bed Memory Recall Sock: With Cue Memory Recall Blue: With Cue Memory Recall Bed: Without Cue Awareness: Impaired Awareness Impairment: Intellectual impairment Problem Solving: Impaired Problem Solving Impairment: Verbal complex;Verbal basic Executive Function: Reasoning Reasoning: Impaired Reasoning Impairment: Verbal complex Behaviors: Perseveration Safety/Judgment: Impaired       Comprehension  Auditory Comprehension Overall Auditory Comprehension: Impaired Yes/No Questions: Within Functional Limits Commands: Within Functional Limits Conversation: Complex Other Conversation Comments: high level deficits observed in conversational speech  Reading Comprehension Reading Status: Within funtional limits    Expression Expression Primary Mode of Expression: Verbal Verbal Expression Overall Verbal Expression: Impaired Initiation: No impairment Level of Generative/Spontaneous Verbalization: Conversation Repetition: No impairment Naming: No impairment Pragmatics: No impairment Other Verbal Expression Comments: Anomia in conversational speech  Written Expression Dominant Hand: Right Written Expression: Not tested   Oral / Motor  Oral Motor/Sensory Function Overall Oral Motor/Sensory Function: Within functional limits Motor Speech Overall Motor Speech: Impaired Respiration: Within functional limits Phonation: Normal Resonance: Within functional limits Articulation: Impaired Level of Impairment: Conversation Intelligibility: Intelligible Motor Planning: Witnin functional limits Motor Speech Errors: Not applicable   GO                   Colin Mulders M.S., CCC-SLP Acute Rehabilitation Services Office: [phone]  Elvia Collum Yoshiaki Kreuser 12/31/2019, 1:27 PM

## 2019-12-31 NOTE — Progress Notes (Signed)
Patient ID: Ryan Rice, male   DOB: 11-May-1951, 69 y.o.   MRN: [id number] BP (!) 142/67 (BP Location: Right Arm)   Pulse 70   Temp 98.3 F (36.8 C) (Oral)   Resp 20   Ht 5\' 11"  (1.803 m)   Wt 86.6 kg   SpO2 100%   BMI 26.64 kg/m  Alert, oriented x 4 Speech is clear, near fluent Moving all extremities well Plan on biopsy Thursday/friday

## 2019-12-31 NOTE — TOC Initial Note (Signed)
Transition of Care [facility]) - Initial/Assessment Note    Patient Details  Name: Ryan Rice MRN: [id number] Date of Birth: 10-19-1950  Transition of [facility]) CM/SW Contact:    Pollie Friar, RN Phone Number: 12/31/2019, 12:47 PM  Clinical Narrative:                 Recommendations are for [facility] services at d/c. Pt provided choice and selected [facility]. Cory with [facility]mings accepted the referral. Pt to have biopsy on Thursday and will follow for further d/c needs.   Expected Discharge Plan: [place] Barriers to Discharge: Continued Medical Work up   Patient Goals and CMS Choice   CMS Medicare.gov Compare Post Acute Care list provided to:: Patient Choice offered to / list presented to : Patient  Expected Discharge Plan and Services Expected Discharge Plan: [place]   Discharge Planning Services: CM Consult Post Acute Care Choice: [place] arrangements for the past 2 months: [place]: [place] Date [place]: 12/31/19   Representative spoke with at [place]: Tommi Rumps  Prior Living Arrangements/Services Living arrangements for the past 2 months: [place] Lives with:: Spouse Patient language and need for interpreter reviewed:: Yes Do you feel safe going back to the place where you live?: Yes      Need for Family Participation in Patient Care: Yes (Comment) Care giver support system in place?: Yes (comment) Current home services: DME(cane, walker, 3 n 1) Criminal Activity/Legal Involvement Pertinent to Current Situation/Hospitalization: No - Comment as needed  Activities of Daily Living      Permission Sought/Granted                  Emotional Assessment Appearance:: Appears stated age Attitude/Demeanor/Rapport: Engaged Affect (typically observed): Accepting Orientation: : Oriented to Self, Oriented to Place, Oriented to  Time, Oriented  to Situation   Psych Involvement: No (comment)  Admission diagnosis:  Confusion [R41.0] Brain tumor ([place]) [D49.6] Brain mass [G93.89] Patient Active Problem List   Diagnosis Date Noted  . Brain tumor ([place]) 12/30/2019  . Hip pain, bilateral 12/30/2019  . Elevated PSA 12/30/2019  . Atrial fibrillation, chronic ([place]) 12/30/2019  . Hypomagnesemia 03/20/2019  . Diabetes ([place]) 03/15/2019  . Glaucoma 03/15/2019  . Scleral hemorrhage of left eye 03/15/2019  . Gout flare 03/15/2019  . Debility 03/08/2019  . AKI (acute kidney injury) ([place])   . CAP (community acquired pneumonia)   . Kidney transplant recipient   . Sepsis ([place]) 02/27/2019   PCP:  Drosinis, Pamalee Leyden, PA-C Pharmacy:   [id number] [place], [place] - [address] AT [address] [place] [address] Phone: [phone] Fax: [phone]  [facility] - [place], [address] [place] [address] Phone: [phone] Fax: [phone]     Social Determinants of Health (SDOH) Interventions    Readmission Risk Interventions No flowsheet data found.

## 2019-12-31 NOTE — Progress Notes (Signed)
Physical Therapy Treatment Patient Details Name: Ryan Rice MRN: [id number] DOB: 1950-10-23 Today's Date: 12/31/2019    History of Present Illness 69 y.o. male with medical history significant of PVD; renal transplant (2013); HTN; DM; and afib presenting with AMS. Pt reporting some expressive aphasia, tremors, urinary incontinence. Pt also reports L hip and leg pain for a few months. 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    PT Comments    Patient seen for PT treatment. Pt continues to present with impaired gait/balance and cognitive deficits increasing risk for falls. Pt with limited insight into deficits and needs cues to take rest breaks during mobility tasks. Attempted DGI to further challenge balance however pt unable to follow commands for involved tasks. Pt will benefit from further skilled PT services to maximize independence and safety with mobility and will need 24 hour supervision/assistance.    Follow Up Recommendations  Home health PT;Supervision/Assistance - 24 hour     Equipment Recommendations  None recommended by PT    Recommendations for Other Services       Precautions / Restrictions Precautions Precautions: Fall Restrictions Weight Bearing Restrictions: No    Mobility  Bed Mobility Overal bed mobility: Modified Independent                Transfers Overall transfer level: Needs assistance Equipment used: None Transfers: Sit to/from Stand Sit to Stand: Min guard         General transfer comment: min guard for safety upon standing due to instability; no assist to power up into standing  Ambulation/Gait Ambulation/Gait assistance: Min guard;Min assist Gait Distance (Feet): (120 ft X 2 trials with seated rest break) Assistive device: None(assist at trunk with use of gait belt at times ) Gait Pattern/deviations: Trunk flexed;Decreased stance time - left;Step-through pattern;Decreased step length - left;Decreased step length - right Gait  velocity: reduced   General Gait Details: pt with flexed trunk that increases with distance; pt declined use of AD; pt requires assist to steady at times especially when feeling fatigued as he quickly attempts to get somewhere to sit down; attempted DGI however pt unable to follow cues for tasks involved   Stairs Stairs: Yes Stairs assistance: Min assist Stair Management: Two rails;Alternating pattern;Forwards Number of Stairs: (3 steps then 2 shorter steps) General stair comments: cues for safety and step to pattern; pt alternating pattern despite cues   Wheelchair Mobility    Modified Rankin (Stroke Patients Only)       Balance Overall balance assessment: Needs assistance Sitting-balance support: No upper extremity supported;Feet supported Sitting balance-Leahy Scale: Good     Standing balance support: No upper extremity supported Standing balance-Leahy Scale: Fair                              Cognition Arousal/Alertness: Awake/alert Behavior During Therapy: WFL for tasks assessed/performed Overall Cognitive Status: Impaired/Different from baseline Area of Impairment: Orientation;Attention;Memory;Following commands;Safety/judgement;Awareness;Problem solving                 Orientation Level: Disoriented to;Time Current Attention Level: Sustained Memory: Decreased short-term memory Following Commands: Follows multi-step commands inconsistently;Follows one step commands consistently Safety/Judgement: Decreased awareness of safety;Decreased awareness of deficits Awareness: Emergent Problem Solving: Slow processing;Requires tactile cues;Difficulty sequencing;Requires verbal cues General Comments: pt is a Troung impulsive while mobilizing and with decreased insight into deficits/need for rest breaks although able to state he feels he is working hard during mobility tasks  Exercises      General Comments        Pertinent Vitals/Pain Pain  Assessment: No/denies pain Pain Score: 0-No pain    Home Living     Available Help at Discharge: Available 24 hours/day;Family Type of Home: House              Prior Function            PT Goals (current goals can now be found in the care plan section) Acute Rehab PT Goals Patient Stated Goal: To go home Progress towards PT goals: Progressing toward goals    Frequency    Min 3X/week      PT Plan Current plan remains appropriate    Co-evaluation              AM-PAC PT "6 Clicks" Mobility   Outcome Measure  Help needed turning from your back to your side while in a flat bed without using bedrails?: None Help needed moving from lying on your back to sitting on the side of a flat bed without using bedrails?: None Help needed moving to and from a bed to a chair (including a wheelchair)?: None Help needed standing up from a chair using your arms (e.g., wheelchair or bedside chair)?: A Clum Help needed to walk in hospital room?: A Puleo Help needed climbing 3-5 steps with a railing? : A Gayle 6 Click Score: 21    End of Session Equipment Utilized During Treatment: Gait belt Activity Tolerance: Patient tolerated treatment well Patient left: in chair;with call bell/phone within reach;with chair alarm set Nurse Communication: Mobility status PT Visit Diagnosis: Other abnormalities of gait and mobility (R26.89);Other symptoms and signs involving the nervous system (R29.898)     Time: [id number] PT Time Calculation (min) (ACUTE ONLY): 27 min  Charges:  $Gait Training: 23-37 mins                     [name], PTA Acute Rehabilitation Services Pager: [phone] Office: [phone]     [name] 12/31/2019, 3:00 PM

## 2019-12-31 NOTE — Progress Notes (Signed)
PROGRESS NOTE  Ryan Rice [id number] DOB: 09/17/1951 DOA: 12/29/2019 PCP: Drosinis, Pamalee Leyden, PA-C  Brief History    Ryan Rice is a 69 y.o. male with medical history significant of PVD; renal transplant (2013); HTN; DM; and afib presenting with AMS.  He presented to [facility] last night for AMS.  His wife noticed Saturday that he wasn't talking much, was staring.  When asked a question, he would hesitate and stutter.  They were concerned about a ministroke.  He did not seem to have other symptoms.  She saw a Sigmon of this Friday but Saturday it was much worse.  No headaches or vision changes.  He has noticed expressive aphasia. Occasional tremor.  Intermittent urinary incontinence.   He is usually a talker and less able to do that.  No apparent changes in executive function.  The patient denies unintentional weight loss or night sweats.  He is eating and drinking ok.  He was recently taken off of his anticoagulation by his PCP in preparation for a prostate biopsy that was to have taken place this week at [facility]. The patient has had an elevated PSA. It was 9.34 in 10/2019, up from 6.79 in 05/2019.  He also has left hip and leg pain.  This has been going on for a couple of months and is about the same.  It hurts in his hip and radiates down his entire leg.  No back pain.   Neurosurgery has been consulted. The plan is to perform brain biopsy on 01/02/2020.  Consultants   Neurosurgery  Procedures   None  Antibiotics   Anti-infectives (From admission, onward)   Start     Dose/Rate Route Frequency Ordered Stop   12/30/19 1100  sulfamethoxazole-trimethoprim (BACTRIM) 400-80 MG per tablet 1 tablet     1 tablet Oral Every M-W-F 12/30/19 1009        Subjective  The patient is resting comfortably. No new complaints.  Objective   Vitals:  Vitals:   12/31/19 0400 12/31/19 0848  BP: (!) 160/82 (!) 143/67  Pulse: 76 77  Resp: 20 18  Temp: 98.4 F (36.9 C) 98.5 F (36.9 C)  SpO2:  100% 100%   Exam:  Constitutional:   The patient is awake, alert, and oriented x 3. No acute distress. Respiratory:   No increased work of breathing.  No wheezes, rales, or rhonchi  No tactile fremitus Cardiovascular:   Regular rate and rhythm  No murmurs, ectopy, or gallups.  No lateral PMI. No thrills. Abdomen:   Abdomen is soft, non-tender, non-distended  No hernias, masses, or organomegaly  Normoactive bowel sounds.  Musculoskeletal:   No cyanosis, clubbing, or edema Skin:   No rashes, lesions, ulcers  palpation of skin: no induration or nodules Neurologic:   CN 2-12 intact  Sensation all 4 extremities intact Psychiatric:   Mental status o Mood, affect appropriate o Orientation to person, place, time   judgment and insight appear intact  I have personally reviewed the following:   Today's Data   Vitals, BMP, CBC  Imaging   MRI Brain  Scheduled Meds:  atorvastatin  10 mg Oral q1800   brimonidine  1 drop Right Eye BID   cinacalcet  60 mg Oral Daily   dexamethasone (DECADRON) injection  8 mg Intravenous Q6H   docusate sodium  100 mg Oral BID   dorzolamide-timolol  1 drop Right Eye BID   famotidine  20 mg Oral Daily   finasteride  5 mg Oral Daily  folic acid  1 mg Oral Daily   insulin aspart  0-15 Units Subcutaneous TID WC   insulin aspart  0-5 Units Subcutaneous QHS   insulin glargine  14 Units Subcutaneous Daily   labetalol  100 mg Oral BID   magnesium oxide  400 mg Oral Daily   mycophenolate  360 mg Oral BID   NIFEdipine  60 mg Oral Daily   sulfamethoxazole-trimethoprim  1 tablet Oral Q M,W,F   tacrolimus  2 mg Oral BID   tamsulosin  0.4 mg Oral QPC breakfast   Continuous Infusions:  lactated ringers 100 mL/hr at 12/31/19 0540    Principal Problem:   Brain tumor ([place]) Active Problems:   AKI (acute kidney injury) ([place])   Kidney transplant recipient   Diabetes ([place])   Glaucoma   Hip pain, bilateral    Elevated PSA   Atrial fibrillation, chronic ([place])   LOS: 1 day   A & P  Brain tumor: Patient with acute onset of confusion, expressive aphasia. 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 This is the source of his symptoms. 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 Neurosurgery has been consulted, and the patient has been started on IV Decadron 8 mg q 6 hrs. Neurosurgery plans to take the patient to the OR for a biopsy on 01/03/2020. Although, PT has stated that the patient could go home if family were able to provide assistance and support, it seems that this is not possible. The patient's wife has recently undergone surgery for lung cancer. She states that she is unable to care for the patient in her current state. She requests that the patient remain as inpatient pending surgery. The patient's anticoagulation remains held both in preparation for the brain biopsy, the prostate biopsy, and due to the finding of petechia hemorrhages noted on MRI. EEG has been ordered, and results are pending as the patient has been found having staring episodes. PT/OT/ST will be asked to evaluate the patient  Elevated PSA: Patient had planned to have prostate biopsy on 01/01/2020 at [facility]. Family is deciding if the want the patient to go ahead and have the procedure done here, or wait and have it done at [facility] as originally planned. The patient is also experiencing urinary incontinence. He will be continued on proscar and flomax. He is taking bactrim 3x a week for UTI prophylaxis.  AKI with h/o renal transplantation: No recent labs since almost a year ago, but currently with significantly worsened creatinine/GFR. This may be associated with decreased PO intake in the setting of recent confusion. Creatinine is increasing. Will check a renal ultrasound. Continue IV fluids. Also will avoid nephrotoxic medications and hypotension. He will be continued on Sensipar. Voltaren gel  will be held. Will consult nephrology to monitor the status of his renal transplant. He will be continued on prograf and myfortic. Will monitor creatinine, electrolytes, and volume status.  DM II: Recent A1c 6.8, indicating good control. PO meds (Januvia, Gluocphage, Starlix, Actos) have been held. He will receive Lantus 14 units. His glucoses will be followed with FSBS and SSI.   HTN: Continue Nifedipine and Labetalol. Blood pressures under fair control.  Atrial fibrillation: Rate controlled with labetalol. Coumadin is on hold due to upcoming prostate biopsy/brain biopsy/petechial hemorrhages on MRI Brain. Monitor on telemetry.  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 He complains of pain along the left leg  as well as gait instability that appears to be related to the L hip -For now, pain management as needed with eventual plan for orthopedics consult as outpatient and likely to need THR.  Glaucoma: Continue Alphagan, Cosopt  HLD: Continue Lipitor  Hyperkalemia: Improved with IV fluids. Monitor.  I have seen and examined this patient myself. I have spent 34 minutes in his evaluation and care.  DVT prophylaxis:  SCDs Code Status:  Full - confirmed with patient/family Family Communication: No family available. Disposition Plan: To be determined - will depend on whether the patient proceeds with inpatient brain biopsy.  Will also depend on therapy assessments.  Eileene Kisling, DO [facility] Direct contact: see [email or web address]  7PM-7AM contact night coverage as above 12/31/2019, 4:26 PM  LOS: 1 day

## 2019-12-31 NOTE — Progress Notes (Signed)
Inpatient Diabetes Program Recommendations  AACE/ADA: New Consensus Statement on Inpatient Glycemic Control   Target Ranges:  Prepandial:   less than 140 mg/dL      Peak postprandial:   less than 180 mg/dL (1-2 hours)      Critically ill patients:  140 - 180 mg/dL   Results for Ryan Rice, Ryan Rice (MRN [id number]) as of 12/31/2019 11:05  Ref. Range 03/16/2019 06:43 12/30/2019 11:48 12/30/2019 16:36 12/30/2019 21:37 12/31/2019 05:56 12/31/2019 07:51  Glucose-Capillary Latest Ref Range: 70 - 99 mg/dL 148 (H) 198 (H) 211 (H) 251 (H) 256 (H) 236 (H)   Review of Glycemic Control  Diabetes history: DM2 Outpatient Diabetes medications: Januvia 100 mg daily, Metformin XR 500 mg BID, Actos 15 mg daily Current orders for Inpatient glycemic control: Lantus 14 units daily, Novolog 0-15 units TID with meals, Novolog 0-5 units QHS; Decadron 8 mg Q6H  Inpatient Diabetes Program Recommendations:   Insulin - Basal: Noted Lantus 14 units daily ordered today.  Insulin - Meal Coverage: If steroids are continued and post prandial glucose continues to be consistently over 180 mg/dl, please consider ordering Novolog 5 units TID with meals if patient eats at least 50% of meals.  Thanks, Barnie Alderman, RN, MSN, CDE Diabetes Coordinator Inpatient Diabetes Program [phone] (Team Pager from 8am to 5pm)

## 2019-12-31 NOTE — Evaluation (Signed)
Occupational Therapy Evaluation Patient Details Name: Ryan Rice MRN: [id number] DOB: July 12, 1951 Today's Date: 12/31/2019    History of Present Illness 68 y.o. male with medical history significant of PVD; renal transplant (2013); HTN; DM; and afib presenting with AMS. Pt reporting some expressive aphasia, tremors, urinary incontinence. Pt also reports L hip and leg pain for a few months. 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   Clinical Impression   PT admitted with neoplasm frontal lobe found this admission. Pt currently with functional limitiations due to the deficits listed below (see OT problem list). Pt with cognitive deficits noted and decrease awareness to deficits. Pt and wife advised that pt should not driving without recommendations to do so by MD. Pt with decrease processing and safety awareness making it higher risk for wreck.  Pt will benefit from skilled OT to increase their independence and safety with adls and balance to allow discharge [place].     Follow Up Recommendations  Home health OT    Equipment Recommendations  None recommended by OT    Recommendations for Other Services       Precautions / Restrictions Precautions Precautions: Fall Restrictions Weight Bearing Restrictions: No      Mobility Bed Mobility Overal bed mobility: Modified Independent                Transfers Overall transfer level: Needs assistance Equipment used: None Transfers: Sit to/from Stand Sit to Stand: Min guard         General transfer comment: defer to next session.     Balance Overall balance assessment: Needs assistance Sitting-balance support: No upper extremity supported;Feet supported Sitting balance-Leahy Scale: Good     Standing balance support: No upper extremity supported Standing balance-Leahy Scale: Fair                             ADL either performed or assessed with clinical judgement   ADL Overall ADL's : Needs  assistance/impaired Eating/Feeding: Set up;Bed level   Grooming: Set up;Bed level                                 General ADL Comments: pt with cognitive deficits noted      Vision Baseline Vision/History: Wears glasses Wears Glasses: At all times Additional Comments: noted to read the R side of the menu and needed cues to track L side     Perception     Praxis      Pertinent Vitals/Pain Pain Assessment: No/denies pain     Hand Dominance Right   Extremity/Trunk Assessment Upper Extremity Assessment Upper Extremity Assessment: RUE deficits/detail RUE Deficits / Details: WFL AROM with tremor RUE Coordination: (slow and deliberate finger to nose w/ mild intention tremor) LUE Coordination: (slow and deliberate finger to nose w/ mild intention tremor)       Cervical / Trunk Assessment Cervical / Trunk Assessment: Normal   Communication Communication Communication: No difficulties   Cognition Arousal/Alertness: Awake/alert Behavior During Therapy: WFL for tasks assessed/performed Overall Cognitive Status: Impaired/Different from baseline Area of Impairment: Orientation;Attention;Memory;Following commands;Safety/judgement;Awareness;Problem solving                 Orientation Level: Disoriented to;Time Current Attention Level: Sustained Memory: Decreased short-term memory Following Commands: Follows multi-step commands inconsistently;Follows one step commands consistently Safety/Judgement: Decreased awareness of safety;Decreased awareness of deficits Awareness: Emergent Problem Solving: Slow processing;Requires  tactile cues;Difficulty sequencing;Requires verbal cues General Comments: pt unable to call in lunch even after correctly reading out loud the phone number. pt reports if house on fire he should exit the house and use the fire hose. advised wife and patient that due to reaction time should not drive. pt states ' he disagreed then agreed with all  statements   General Comments       Exercises     Shoulder Instructions      Home Living Family/patient expects to be discharged to:: Private residence Living Arrangements: Spouse/significant other Available Help at Discharge: Available 24 hours/day;Family Type of Home: House Home Access: Stairs to enter;Other (comment) Entrance Stairs-Number of Steps: 1STE back door and able ot use RW (main entryway) and 5STE without handrails on front enttrance Entrance Stairs-Rails: None Home Layout: One level     Bathroom Shower/Tub: Walk-in shower;Tub/shower unit   Bathroom Toilet: Standard     Home Equipment: Environmental consultant - 2 wheels   Additional Comments: no DME  Lives With: Spouse;Family(wife and granddaughter (50 years old) )    Prior Functioning/Environment Level of Independence: Independent        Comments: enjoys fishing        OT Problem List: Decreased strength;Decreased activity tolerance;Impaired balance (sitting and/or standing);Decreased cognition;Decreased safety awareness;Decreased knowledge of use of DME or AE;Decreased knowledge of precautions      OT Treatment/Interventions: Self-care/ADL training;Therapeutic exercise;DME and/or AE instruction;Therapeutic activities;Cognitive remediation/compensation;Patient/family education;Balance training    OT Goals(Current goals can be found in the care plan section) Acute Rehab OT Goals Patient Stated Goal: To go home OT Goal Formulation: With patient/family Time For Goal Achievement: 01/14/20 Potential to Achieve Goals: Good  OT Frequency: Min 2X/week   Barriers to D/C:            Co-evaluation              AM-PAC OT "6 Clicks" Daily Activity     Outcome Measure Help from another person eating meals?: A [place] Help from another person taking care of personal grooming?: A [place]a Help from another person toileting, which includes using toliet, bedpan, or urinal?: A Agnes Help from another person bathing  (including washing, rinsing, drying)?: A Coxe Help from another person to put on and taking off regular upper body clothing?: A Castillo Help from another person to put on and taking off regular lower body clothing?: A Mendel 6 Click Score: 18   End of Session Nurse Communication: Mobility status;Precautions  Activity Tolerance: Patient tolerated treatment well Patient left: in bed;with call bell/phone within reach;with bed alarm set;with family/visitor present  OT Visit Diagnosis: Unsteadiness on feet (R26.81);Muscle weakness (generalized) (M62.81)                Time: [id number] OT Time Calculation (min): 24 min Charges:  OT General Charges $OT Visit: 1 Visit OT Evaluation $OT Eval Moderate Complexity: 1 Mod OT Treatments $Self Care/Home Management : 8-22 mins   Brynn, OTR/L  Acute Rehabilitation Services Pager: [phone] Office: [phone] .   Jeri Modena 12/31/2019, 4:35 PM

## 2020-01-01 DIAGNOSIS — D496 Neoplasm of unspecified behavior of brain: Secondary | ICD-10-CM | POA: Diagnosis not present

## 2020-01-01 LAB — GLUCOSE, CAPILLARY
Glucose-Capillary: 189 mg/dL — ABNORMAL HIGH (ref 70–99)
Glucose-Capillary: 225 mg/dL — ABNORMAL HIGH (ref 70–99)
Glucose-Capillary: 189 mg/dL — ABNORMAL HIGH (ref 70–99)
Glucose-Capillary: 197 mg/dL — ABNORMAL HIGH (ref 70–99)

## 2020-01-01 MED ORDER — CEFAZOLIN SODIUM-DEXTROSE 2-4 GM/100ML-% IV SOLN
2.0000 g | INTRAVENOUS | Status: DC
  Filled 2020-01-01: qty 100

## 2020-01-01 MED ORDER — MUPIROCIN 2 % EX OINT
1.0000 "application " | TOPICAL_OINTMENT | Freq: Two times a day (BID) | CUTANEOUS | Status: AC
  Administered 2020-01-02 – 2020-01-06 (×10): 1 via NASAL
  Filled 2020-01-01: qty 22

## 2020-01-01 MED ORDER — LACTATED RINGERS IV SOLN: INTRAVENOUS | Status: AC

## 2020-01-01 MED ORDER — VANCOMYCIN HCL IN DEXTROSE 1-5 GM/200ML-% IV SOLN
1000.0000 mg | INTRAVENOUS | Status: DC
  Filled 2020-01-01: qty 200

## 2020-01-01 MED ORDER — LEVETIRACETAM IN NACL 500 MG/100ML IV SOLN
500.0000 mg | Freq: Two times a day (BID) | INTRAVENOUS | Status: DC
  Administered 2020-01-01 – 2020-01-02 (×2): 500 mg via INTRAVENOUS
  Filled 2020-01-01 (×3): qty 100

## 2020-01-01 NOTE — Progress Notes (Signed)
Inpatient Diabetes Program Recommendations  AACE/ADA: New Consensus Statement on Inpatient Glycemic Control   Target Ranges:  Prepandial:   less than 140 mg/dL      Peak postprandial:   less than 180 mg/dL (1-2 hours)      Critically ill patients:  140 - 180 mg/dL   Results for MAXIMILIAN, HILDEBRANDT (MRN [id number]) as of 01/01/2020 07:57  Ref. Range 12/31/2019 07:51 12/31/2019 11:47 12/31/2019 15:25 12/31/2019 19:54 01/01/2020 05:55  Glucose-Capillary Latest Ref Range: 70 - 99 mg/dL 236 (H) 255 (H) 304 (H) 230 (H) 189 (H)   Review of Glycemic Control  Diabetes history: DM2 Outpatient Diabetes medications: Januvia 100 mg daily, Metformin XR 500 mg BID, Actos 15 mg daily Current orders for Inpatient glycemic control: Lantus 20 units daily, Novolog 0-15 units TID with meals, Novolog 0-5 units QHS; Decadron 8 mg Q6H  Inpatient Diabetes Program Recommendations:   Insulin - Basal: Noted Lantus increased to 20 units daily today.  Insulin - Meal Coverage: If steroids are continued and post prandial glucose continues to be consistently over 180 mg/dl, please consider ordering Novolog 5 units TID with meals if patient eats at least 50% of meals.  Thanks, Barnie Alderman, RN, MSN, CDE Diabetes Coordinator Inpatient Diabetes Program [phone] (Team Pager from 8am to 5pm)

## 2020-01-01 NOTE — Progress Notes (Signed)
Patient ID: Ryan Rice, male   DOB: 12-Nov-1950, 69 y.o.   MRN: [id number] BP (!) 158/74 (BP Location: Right Arm)   Pulse 62   Temp 98.2 F (36.8 C) (Oral)   Resp 18   Ht 5\' 11"  (1.803 m)   Wt 86.6 kg   SpO2 96%   BMI 26.64 kg/m  Alert and oriented x 4, speech is clear and fluent No drift Moving all extremities well No difficulty with naming objects.  No difficulty with repetition Plan on biopsy tomorrow.

## 2020-01-01 NOTE — Progress Notes (Addendum)
Upon initial shift assessment patient's pupil showed different finding as compared to other shifts. The left pupil was 47mm and round while the right was oval shaped. Physician notified.

## 2020-01-01 NOTE — Progress Notes (Signed)
PROGRESS NOTE  Ryan Rice [id number] DOB: 07/30/1951 DOA: 12/29/2019 PCP: Drosinis, Pamalee Leyden, PA-C  Brief History    Ryan Rice is a 69 y.o. male with medical history significant of PVD; renal transplant (2013); HTN; DM; and afib presenting with AMS.  He presented to [facility] last night for AMS.  His wife noticed Saturday that he wasn't talking much, was staring.  When asked a question, he would hesitate and stutter.  They were concerned about a ministroke.  He did not seem to have other symptoms.  She saw a [place] of this Friday but Saturday it was much worse.  No headaches or vision changes.  He has noticed expressive aphasia. Occasional tremor.  Intermittent urinary incontinence.   He is usually a talker and less able to do that.  No apparent changes in executive function.  The patient denies unintentional weight loss or night sweats.  He is eating and drinking ok.  He was recently taken off of his anticoagulation by his PCP in preparation for a prostate biopsy that was to have taken place this week at [facility]. The patient has had an elevated PSA. It was 9.34 in 10/2019, up from 6.79 in 05/2019.  He also has left hip and leg pain.  This has been going on for a couple of months and is about the same.  It hurts in his hip and radiates down his entire leg.  No back pain.   Neurosurgery has been consulted. The plan is to perform brain biopsy on 01/02/2020.  Consultants  . Neurosurgery  Procedures  . None  Antibiotics   Anti-infectives (From admission, onward)   Start     Dose/Rate Route Frequency Ordered Stop   12/30/19 1100  sulfamethoxazole-trimethoprim (BACTRIM) 400-80 MG per tablet 1 tablet     1 tablet Oral Every M-W-F 12/30/19 1009       Subjective  The patient is resting comfortably. No new complaints.  Objective   Vitals:  Vitals:   01/01/20 0813 01/01/20 1137  BP: (!) 150/75 (!) 143/79  Pulse: 61 79  Resp: 18 16  Temp: 97.7 F (36.5 C) 97.7 F (36.5 C)  SpO2: 99%  100%   Exam:  Constitutional:  . The patient is awake, alert, and oriented x 3. No acute distress. EYES: Anisocorea. Right pupil 2-3 mm. Left is 37mm. Respiratory:  . No increased work of breathing. . No wheezes, rales, or rhonchi . No tactile fremitus Cardiovascular:  . Regular rate and rhythm . No murmurs, ectopy, or gallups. . No lateral PMI. No thrills. Abdomen:  . Abdomen is soft, non-tender, non-distended . No hernias, masses, or organomegaly . Normoactive bowel sounds.  Musculoskeletal:  . No cyanosis, clubbing, or edema Skin:  . No rashes, lesions, ulcers . palpation of skin: no induration or nodules Neurologic:  . CN 2-12 intact . Sensation all 4 extremities intact Psychiatric:  . Mental status o Mood, affect appropriate o Orientation to person, place, time  . judgment and insight appear intact  I have personally reviewed the following:   Today's Data  . Vitals, BMP, CBC  Imaging  . MRI Brain  Scheduled Meds: . atorvastatin  10 mg Oral q1800  . brimonidine  1 drop Right Eye BID  . cinacalcet  60 mg Oral Daily  . dexamethasone (DECADRON) injection  8 mg Intravenous Q6H  . docusate sodium  100 mg Oral BID  . dorzolamide-timolol  1 drop Right Eye BID  . famotidine  20 mg Oral Daily  .  finasteride  5 mg Oral Daily  . folic acid  1 mg Oral Daily  . insulin aspart  0-15 Units Subcutaneous TID WC  . insulin aspart  0-5 Units Subcutaneous QHS  . insulin glargine  20 Units Subcutaneous Daily  . labetalol  100 mg Oral BID  . magnesium oxide  400 mg Oral Daily  . mycophenolate  360 mg Oral BID  . NIFEdipine  60 mg Oral Daily  . sulfamethoxazole-trimethoprim  1 tablet Oral Q M,W,F  . tacrolimus  2 mg Oral BID  . tamsulosin  0.4 mg Oral QPC breakfast   Continuous Infusions: . lactated ringers 100 mL/hr at 12/31/19 0540    Principal Problem:   Brain tumor ([place]) Active Problems:   AKI (acute kidney injury) ([place])   Kidney transplant recipient   Diabetes  ([place])   Glaucoma   Hip pain, bilateral   Elevated PSA   Atrial fibrillation, chronic ([place])   LOS: 2 days   A & P  Brain tumor: Patient with acute onset of confusion, expressive aphasia. 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 This is the source of his symptoms. 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 Neurosurgery has been consulted, and the patient has been started on IV Decadron 8 mg q 6 hrs. Neurosurgery plans to take the patient to the OR for a biopsy on 01/03/2020. Although, PT has stated that the patient could go home if family were able to provide assistance and support, it seems that this is not possible. The patient's wife has recently undergone surgery for lung cancer. She states that she is unable to care for the patient in her current state. She requests that the patient remain as inpatient pending surgery. The patient's anticoagulation remains held both in preparation for the brain biopsy, the prostate biopsy, and due to the finding of petechia hemorrhages noted on MRI. EEG has been ordered, and results are pending as the patient has been found having staring episodes. PT/OT/ST will be asked to evaluate the patient.   Aniscoria: Likely due to brain mass. Pt is on steroids to address vasogenic edema.  Elevated PSA: Patient had planned to have prostate biopsy on 01/01/2020 at [facility]. Family is deciding if the want the patient to go ahead and have the procedure done here, or wait and have it done at [facility] as originally planned. The patient is also experiencing urinary incontinence. He will be continued on proscar and flomax. He is taking bactrim 3x a week for UTI prophylaxis.  AKI with h/o renal transplantation: No recent labs since almost a year ago, but currently with significantly worsened creatinine/GFR. This may be associated with decreased PO intake in the setting of recent confusion. Creatinine is increasing. Will check a renal  ultrasound. Continue IV fluids. Also will avoid nephrotoxic medications and hypotension. He will be continued on Sensipar. Voltaren gel will be held. Will consult nephrology to monitor the status of his renal transplant. He will be continued on prograf and myfortic. Will monitor creatinine, electrolytes, and volume status.  DM II: Recent A1c 6.8, indicating good control. PO meds (Januvia, Gluocphage, Starlix, Actos) have been held. He will receive Lantus 14 units. His glucoses will be followed with FSBS and SSI.   HTN: Continue Nifedipine and Labetalol. Blood pressures under fair control.  Atrial fibrillation: Rate controlled with labetalol. Coumadin is on hold due to upcoming prostate biopsy/brain biopsy/petechial hemorrhages on MRI Brain. Monitor on telemetry.  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 He complains of pain along the left leg as well as gait instability that appears to be related to the L hip -For now, pain management as needed with eventual plan for orthopedics consult as outpatient and likely to need THR.  Glaucoma: Continue Alphagan, Cosopt  HLD: Continue Lipitor  Hyperkalemia: Improved with IV fluids. Monitor.  I have seen and examined this patient myself. I have spent 30 minutes in his evaluation and care.  DVT prophylaxis:  SCDs Code Status:  Full - confirmed with patient/family Family Communication: No family available. Disposition Plan: To be determined - will depend on whether the patient proceeds with inpatient brain biopsy.  Will also depend on therapy assessments.  Corbitt Cloke, DO [facility] Direct contact: see [email or web address]  7PM-7AM contact night coverage as above 01/01/2020, 2:51 PM  LOS: 1 day

## 2020-01-02 ENCOUNTER — Encounter (HOSPITAL_COMMUNITY)
Admission: EM | Disposition: A | Discharge status: Rehabilitation Facility | Payer: Self-pay | Source: Home / Self Care | Attending: Internal Medicine

## 2020-01-02 ENCOUNTER — Other Ambulatory Visit: Payer: Self-pay | Admitting: Neurosurgery

## 2020-01-02 ENCOUNTER — Encounter (HOSPITAL_COMMUNITY): Payer: Self-pay | Admitting: Certified Registered"

## 2020-01-02 ENCOUNTER — Inpatient Hospital Stay (HOSPITAL_COMMUNITY): Payer: Medicare HMO

## 2020-01-02 ENCOUNTER — Encounter (HOSPITAL_COMMUNITY): Payer: Self-pay | Admitting: Internal Medicine

## 2020-01-02 DIAGNOSIS — D496 Neoplasm of unspecified behavior of brain: Secondary | ICD-10-CM | POA: Diagnosis not present

## 2020-01-02 LAB — GLUCOSE, CAPILLARY
Glucose-Capillary: 138 mg/dL — ABNORMAL HIGH (ref 70–99)
Glucose-Capillary: 152 mg/dL — ABNORMAL HIGH (ref 70–99)
Glucose-Capillary: 107 mg/dL — ABNORMAL HIGH (ref 70–99)
Glucose-Capillary: 116 mg/dL — ABNORMAL HIGH (ref 70–99)

## 2020-01-02 LAB — CBC WITH DIFFERENTIAL/PLATELET
Abs Immature Granulocytes: 0.05 10*3/uL (ref 0.00–0.07)
Basophils Absolute: 0 10*3/uL (ref 0.0–0.1)
Basophils Relative: 0 %
Eosinophils Absolute: 0 10*3/uL (ref 0.0–0.5)
Eosinophils Relative: 0 %
HCT: 39.1 % (ref 39.0–52.0)
Hemoglobin: 12.7 g/dL — ABNORMAL LOW (ref 13.0–17.0)
Immature Granulocytes: 1 %
Lymphocytes Relative: 4 %
Lymphs Abs: 0.3 10*3/uL — ABNORMAL LOW (ref 0.7–4.0)
MCH: 27.1 pg (ref 26.0–34.0)
MCHC: 32.5 g/dL (ref 30.0–36.0)
MCV: 83.5 fL (ref 80.0–100.0)
Monocytes Absolute: 0.3 10*3/uL (ref 0.1–1.0)
Monocytes Relative: 4 %
Neutro Abs: 6.8 10*3/uL (ref 1.7–7.7)
Neutrophils Relative %: 91 %
Platelets: 130 10*3/uL — ABNORMAL LOW (ref 150–400)
RBC: 4.68 MIL/uL (ref 4.22–5.81)
RDW: 14.7 % (ref 11.5–15.5)
WBC: 7.4 10*3/uL (ref 4.0–10.5)
nRBC: 0 % (ref 0.0–0.2)

## 2020-01-02 LAB — BASIC METABOLIC PANEL
Anion gap: 10 (ref 5–15)
BUN: 33 mg/dL — ABNORMAL HIGH (ref 8–23)
CO2: 18 mmol/L — ABNORMAL LOW (ref 22–32)
Calcium: 8.6 mg/dL — ABNORMAL LOW (ref 8.9–10.3)
Chloride: 108 mmol/L (ref 98–111)
Creatinine, Ser: 1.14 mg/dL (ref 0.61–1.24)
GFR calc Af Amer: >60 mL/min (ref >60)
GFR calc non Af Amer: >60 mL/min (ref >60)
Glucose, Bld: 160 mg/dL — ABNORMAL HIGH (ref 70–99)
Potassium: 4.2 mmol/L (ref 3.5–5.1)
Sodium: 136 mmol/L (ref 135–145)

## 2020-01-02 LAB — SURGICAL PCR SCREEN
MRSA, PCR: NEGATIVE
Staphylococcus aureus: NEGATIVE

## 2020-01-02 LAB — TYPE AND SCREEN
ABO/RH(D): A POS
Antibody Screen: NEGATIVE

## 2020-01-02 SURGERY — FRAMELESS BIOPSY WITH BRAINLAB
Anesthesia: General | Laterality: Left

## 2020-01-02 MED ORDER — SUFENTANIL CITRATE 50 MCG/ML IV SOLN
INTRAVENOUS | Status: AC
  Filled 2020-01-02: qty 1

## 2020-01-02 MED ORDER — LACTATED RINGERS IV SOLN: INTRAVENOUS | Status: DC

## 2020-01-02 MED ORDER — PROPOFOL 10 MG/ML IV BOLUS
INTRAVENOUS | Status: AC
  Filled 2020-01-02: qty 20

## 2020-01-02 NOTE — Progress Notes (Signed)
Physical Therapy Treatment Patient Details Name: Ryan Rice MRN: [id number] DOB: August 28, 1951 Today's Date: 01/02/2020    History of Present Illness 69 y.o. male with medical history significant of PVD; renal transplant (2013); HTN; DM; and afib presenting with AMS. Pt reporting some expressive aphasia, tremors, urinary incontinence. Pt also reports L hip and leg pain for a few months. 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    PT Comments    Patient seen for mobility progression and tolerated session well. Pt continues to present with impaired gait and cognition. Pt would likely benefit from AD for at least single UE support although pt denies need for AD.  Pt will continue to benefit from further skilled PT services to maximize independence and safety with mobility.     Follow Up Recommendations  Home health PT;Supervision/Assistance - 24 hour     Equipment Recommendations  None recommended by PT    Recommendations for Other Services       Precautions / Restrictions Precautions Precautions: Fall Restrictions Weight Bearing Restrictions: No    Mobility  Bed Mobility Overal bed mobility: Modified Independent             General bed mobility comments: pt OOB in chair upon arrival   Transfers Overall transfer level: Needs assistance Equipment used: Rolling walker (2 wheeled) Transfers: Sit to/from Stand Sit to Stand: Min guard         General transfer comment: min guard for safety  Ambulation/Gait Ambulation/Gait assistance: Min guard Gait Distance (Feet): 200 Feet Assistive device: None;IV Pole Gait Pattern/deviations: Trunk flexed;Decreased stance time - left;Step-through pattern;Decreased step length - left;Decreased step length - right;Antalgic Gait velocity: reduced   General Gait Details: pt pushed IV pole most of gait distance and demonstrates improved posture and stability with single UE support; last ~70 ft ambulated without UE support and with  increased trunk flexion and antalgic gait pattern   Stairs             Wheelchair Mobility    Modified Rankin (Stroke Patients Only)       Balance Overall balance assessment: Needs assistance Sitting-balance support: No upper extremity supported;Feet supported Sitting balance-Leahy Scale: Good     Standing balance support: Single extremity supported;No upper extremity supported;During functional activity Standing balance-Leahy Scale: Fair Standing balance comment: Use of walker in ambulation due to balance deficits, propped on one hand or two when completing functional tasks at the sink in standing                            Cognition Arousal/Alertness: Awake/alert Behavior During Therapy: WFL for tasks assessed/performed Overall Cognitive Status: Impaired/Different from baseline Area of Impairment: Attention;Memory;Following commands;Safety/judgement;Awareness;Problem solving                   Current Attention Level: Sustained Memory: Decreased short-term memory;Decreased recall of precautions Following Commands: Follows multi-step commands inconsistently;Follows one step commands consistently Safety/Judgement: Decreased awareness of safety;Decreased awareness of deficits Awareness: Emergent Problem Solving: Slow processing;Requires tactile cues;Difficulty sequencing;Requires verbal cues General Comments: recalled 1/3 words and answered with word from OT session this am; continues to decreased insight into deficits and poor safety awareness      Exercises      General Comments        Pertinent Vitals/Pain Pain Assessment: No/denies pain    Home Living  Prior Function            PT Goals (current goals can now be found in the care plan section) Acute Rehab PT Goals Patient Stated Goal: To go home Progress towards PT goals: Progressing toward goals    Frequency    Min 3X/week      PT Plan Current  plan remains appropriate    Co-evaluation              AM-PAC PT "6 Clicks" Mobility   Outcome Measure  Help needed turning from your back to your side while in a flat bed without using bedrails?: None Help needed moving from lying on your back to sitting on the side of a flat bed without using bedrails?: None Help needed moving to and from a bed to a chair (including a wheelchair)?: None Help needed standing up from a chair using your arms (e.g., wheelchair or bedside chair)?: A [place] Help needed to walk in hospital room?: A [place] Help needed climbing 3-5 steps with a railing? : A [place] 6 Click Score: 21    End of Session Equipment Utilized During Treatment: Gait belt Activity Tolerance: Patient tolerated treatment well Patient left: in chair;with call bell/phone within reach;with chair alarm set Nurse Communication: Mobility status PT Visit Diagnosis: Other abnormalities of gait and mobility (R26.89);Other symptoms and signs involving the nervous system (R29.898)     Time: [id number] PT Time Calculation (min) (ACUTE ONLY): 12 min  Charges:  $Gait Training: 8-22 mins                     [name], PTA Acute Rehabilitation Services Pager: [phone] Office: [phone]     [name] 01/02/2020, 1:43 PM

## 2020-01-02 NOTE — Progress Notes (Signed)
PROGRESS NOTE  Ryan Rice [id number] DOB: 10-Oct-1950 DOA: 12/29/2019 PCP: Drosinis, Pamalee Leyden, PA-C  Brief History    Ryan Rice is a 69 y.o. male with medical history significant of PVD; renal transplant (2013); HTN; DM; and afib presenting with AMS.  He presented to [facility] last night for AMS.  His wife noticed Saturday that he wasn't talking much, was staring.  When asked a question, he would hesitate and stutter.  They were concerned about a ministroke.  He did not seem to have other symptoms.  She saw a [place] of this Friday but Saturday it was much worse.  No headaches or vision changes.  He has noticed expressive aphasia. Occasional tremor.  Intermittent urinary incontinence.   He is usually a talker and less able to do that.  No apparent changes in executive function.  The patient denies unintentional weight loss or night sweats.  He is eating and drinking ok.  He was recently taken off of his anticoagulation by his PCP in preparation for a prostate biopsy that was to have taken place this week at [facility]. The patient has had an elevated PSA. It was 9.34 in 10/2019, up from 6.79 in 05/2019.  He also has left hip and leg pain.  This has been going on for a couple of months and is about the same.  It hurts in his hip and radiates down his entire leg.  No back pain.   Neurosurgery has been consulted. The plan is to perform brain biopsy on 01/02/2020.  Consultants  . Neurosurgery  Procedures  . None  Antibiotics   Anti-infectives (From admission, onward)   Start     Dose/Rate Route Frequency Ordered Stop   01/02/20 0000  ceFAZolin (ANCEF) IVPB 2g/100 mL premix     2 g 200 mL/hr over 30 Minutes Intravenous 30 min pre-op 01/01/20 2039     01/02/20 0000  vancomycin (VANCOCIN) IVPB 1000 mg/200 mL premix     1,000 mg 200 mL/hr over 60 Minutes Intravenous 60 min pre-op 01/01/20 2039     12/30/19 1100  sulfamethoxazole-trimethoprim (BACTRIM) 400-80 MG per tablet 1 tablet     1 tablet  Oral Every M-W-F 12/30/19 1009       Subjective  The patient is resting comfortably. No new complaints.  Objective   Vitals:  Vitals:   01/02/20 0805 01/02/20 1226  BP: (!) 172/76 (!) 164/83  Pulse: (!) 57 77  Resp: 17 16  Temp: 98.1 F (36.7 C) 97.7 F (36.5 C)  SpO2: 99% 100%   Exam:  Constitutional:  . The patient is awake, alert, and oriented x 3. No acute distress. EYES: Anisocorea. Right pupil 2-3 mm. Left is 26mm. Respiratory:  . No increased work of breathing. . No wheezes, rales, or rhonchi . No tactile fremitus Cardiovascular:  . Regular rate and rhythm . No murmurs, ectopy, or gallups. . No lateral PMI. No thrills. Abdomen:  . Abdomen is soft, non-tender, non-distended . No hernias, masses, or organomegaly . Normoactive bowel sounds.  Musculoskeletal:  . No cyanosis, clubbing, or edema Skin:  . No rashes, lesions, ulcers . palpation of skin: no induration or nodules Neurologic:  . CN 2-12 intact . Sensation all 4 extremities intact Psychiatric:  . Mental status o Mood, affect appropriate o Orientation to person, place, time  . judgment and insight appear intact  I have personally reviewed the following:   Today's Data  . Vitals, BMP, CBC  Imaging  . MRI Brain  Scheduled Meds: . atorvastatin  10 mg Oral q1800  . brimonidine  1 drop Right Eye BID  . cinacalcet  60 mg Oral Daily  . dexamethasone (DECADRON) injection  8 mg Intravenous Q6H  . docusate sodium  100 mg Oral BID  . dorzolamide-timolol  1 drop Right Eye BID  . famotidine  20 mg Oral Daily  . finasteride  5 mg Oral Daily  . folic acid  1 mg Oral Daily  . insulin aspart  0-15 Units Subcutaneous TID WC  . insulin aspart  0-5 Units Subcutaneous QHS  . insulin glargine  20 Units Subcutaneous Daily  . labetalol  100 mg Oral BID  . magnesium oxide  400 mg Oral Daily  . mupirocin ointment  1 application Nasal BID  . mycophenolate  360 mg Oral BID  . NIFEdipine  60 mg Oral Daily  .  sulfamethoxazole-trimethoprim  1 tablet Oral Q M,W,F  . tacrolimus  2 mg Oral BID  . tamsulosin  0.4 mg Oral QPC breakfast   Continuous Infusions: .  ceFAZolin (ANCEF) IV    . levETIRAcetam 500 mg (01/02/20 0859)  . vancomycin      Principal Problem:   Brain tumor ([place]) Active Problems:   AKI (acute kidney injury) ([place])   Kidney transplant recipient   Diabetes ([place])   Glaucoma   Hip pain, bilateral   Elevated PSA   Atrial fibrillation, chronic ([place])   LOS: 3 days   A & P  Brain tumor: Patient with acute onset of confusion, expressive aphasia. 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 This is the source of his symptoms. 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 Neurosurgery has been consulted, and the patient has been started on IV Decadron 8 mg q 6 hrs. Neurosurgery plans to take the patient to the OR for a biopsy later today. Although, PT has stated that the patient could go home if family were able to provide assistance and support, it seems that this is not possible. The patient's wife has recently undergone surgery for lung cancer. She states that she is unable to care for the patient in her current state. She requests that the patient remain as inpatient pending surgery. The patient's anticoagulation remains held both in preparation for the brain biopsy, the prostate biopsy, and due to the finding of petechia hemorrhages noted on MRI. EEG has been ordered, and results are pending as the patient has been found having staring episodes. PT/OT/ST will be asked to evaluate the patient.   Aniscoria: Likely due to brain mass. Pt is on steroids to address vasogenic edema. Plan is for brain biopsy today.   Elevated PSA: Patient had planned to have prostate biopsy on 01/01/2020 at [facility]. Family is deciding if the want the patient to go ahead and have the procedure done here, or wait and have it done at [facility] as originally planned. The patient  is also experiencing urinary incontinence. He will be continued on proscar and flomax. He is taking bactrim 3x a week for UTI prophylaxis.  AKI with h/o renal transplantation: No recent labs since almost a year ago, but currently with significantly worsened creatinine/GFR. This may be associated with decreased PO intake in the setting of recent confusion. Creatinine is increasing. Will check a renal ultrasound. Continue IV fluids. Also will avoid nephrotoxic medications and hypotension. He will be continued on Sensipar. Voltaren gel will be held. Will consult nephrology to monitor the  status of his renal transplant. He will be continued on prograf and myfortic. Will monitor creatinine, electrolytes, and volume status.  DM II: Recent A1c 6.8, indicating good control. PO meds (Januvia, Gluocphage, Starlix, Actos) have been held. He will receive Lantus 14 units. His glucoses will be followed with FSBS and SSI.   HTN: Continue Nifedipine and Labetalol. Blood pressures under fair control.  Atrial fibrillation: Rate controlled with labetalol. Coumadin is on hold due to upcoming prostate biopsy/brain biopsy/petechial hemorrhages on MRI Brain. Monitor on telemetry.  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 He complains of pain along the left leg as well as gait instability that appears to be related to the L hip -For now, pain management as needed with eventual plan for orthopedics consult as outpatient and likely to need THR.  Glaucoma: Continue Alphagan, Cosopt  HLD: Continue Lipitor  Hyperkalemia: Improved with IV fluids. Monitor.  I have seen and examined this patient myself. I have spent 32 minutes in his evaluation and care.  DVT prophylaxis:  SCDs Code Status:  Full - confirmed with patient/family Family Communication: No family available. Disposition Plan: Patient is from home. Plan is to discharge to home pending patient's Brain biopsy  and sign off of neurosurgery and clearance from them for discharge.  Wilber Fini, DO [facility] Direct contact: see [email or web address]  7PM-7AM contact night coverage as above 01/02/2020, 2:16 PM  LOS: 1 day

## 2020-01-02 NOTE — Progress Notes (Signed)
Occupational Therapy Treatment Patient Details Name: Ryan Rice MRN: [id number] DOB: 03/11/1951 Today's Date: 01/02/2020    History of present illness 69 y.o. male with medical history significant of PVD; renal transplant (2013); HTN; DM; and afib presenting with AMS. Pt reporting some expressive aphasia, tremors, urinary incontinence. Pt also reports L hip and leg pain for a few months. 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   OT comments  Patient continues to make steady progress towards goals in skilled OT session. Patient's session encompassed higher level cognitive tasks and ADLs in standing to progress balance. Pt noted to be increasingly impulsive in session, initiating movements prior to therapist being ready, and forgetting AD as well as leaving IV behind upon completion of functional tasks. Pt able to recall 2/3 words posed to him at beginning of the session, and required cues for overall safety throughout session due to decreased awareness and judgement. Current discharge plan remains appropriate; will continue to follow acutely.    Follow Up Recommendations  Home health OT    Equipment Recommendations  None recommended by OT    Recommendations for Other Services      Precautions / Restrictions Precautions Precautions: Fall Restrictions Weight Bearing Restrictions: No       Mobility Bed Mobility Overal bed mobility: Modified Independent                Transfers Overall transfer level: Needs assistance Equipment used: Rolling walker (2 wheeled) Transfers: Sit to/from Stand Sit to Stand: Min guard              Balance Overall balance assessment: Needs assistance Sitting-balance support: No upper extremity supported;Feet supported Sitting balance-Leahy Scale: Good     Standing balance support: Bilateral upper extremity supported;Single extremity supported Standing balance-Leahy Scale: Poor Standing balance comment: Use of walker in  ambulation due to balance deficits, propped on one hand or two when completing functional tasks at the sink in standing                           ADL either performed or assessed with clinical judgement   ADL Overall ADL's : Needs assistance/impaired     Grooming: Wash/dry hands;Wash/dry face;Oral care;Min guard;Minimal assistance;Standing;Cueing for safety Grooming Details (indicate cue type and reason): Completed in standing, no LOB, but ataxic movement noted throughout     Lower Body Bathing: Min guard;Minimal assistance Lower Body Bathing Details (indicate cue type and reason): Completed in standing at sink, min A for safety and balance         Toilet Transfer: Min guard;Cueing for safety;Cueing for sequencing Toilet Transfer Details (indicate cue type and reason): Simulated with transfer to recliner after functional ambulation         Functional mobility during ADLs: Min guard;Minimal assistance General ADL Comments: pt with cognitive deficits noted      Vision Baseline Vision/History: Wears glasses Wears Glasses: At all times     Perception     Praxis      Cognition Arousal/Alertness: Awake/alert Behavior During Therapy: [facility] for tasks assessed/performed Overall Cognitive Status: Impaired/Different from baseline Area of Impairment: Attention;Memory;Following commands;Safety/judgement;Awareness;Problem solving                   Current Attention Level: Sustained Memory: Decreased short-term memory;Decreased recall of precautions Following Commands: Follows multi-step commands inconsistently;Follows one step commands consistently Safety/Judgement: Decreased awareness of safety;Decreased awareness of deficits Awareness: Emergent Problem Solving: Slow processing;Requires tactile cues;Difficulty  sequencing;Requires verbal cues General Comments: Pt unable to remember 3 words provided at beginning of the session recalling 2/3, pt remains impulsive in  movement, standing before therapist was ready, and leaving the IV and rolling walker behind after functional tasks        Exercises     Shoulder Instructions       General Comments      Pertinent Vitals/ Pain       Pain Assessment: No/denies pain  Home Living                                          Prior Functioning/Environment              Frequency  Min 2X/week        Progress Toward Goals  OT Goals(current goals can now be found in the care plan section)  Progress towards OT goals: Progressing toward goals  Acute Rehab OT Goals Patient Stated Goal: To go home OT Goal Formulation: With patient/family Time For Goal Achievement: 01/14/20 Potential to Achieve Goals: Good  Plan Discharge plan remains appropriate    Co-evaluation                 AM-PAC OT "6 Clicks" Daily Activity     Outcome Measure   Help from another person eating meals?: A [place] Help from another person taking care of personal grooming?: A [place] Help from another person toileting, which includes using toliet, bedpan, or urinal?: A [place] Help from another person bathing (including washing, rinsing, drying)?: A [place] Help from another person to put on and taking off regular upper body clothing?: A [place] Help from another person to put on and taking off regular lower body clothing?: A [place] 6 Click Score: 18    End of Session Equipment Utilized During Treatment: Rolling walker  OT Visit Diagnosis: Unsteadiness on feet (R26.81);Muscle weakness (generalized) (M62.81)   Activity Tolerance Patient tolerated treatment well   Patient Left with call bell/phone within reach;in chair;with chair alarm set   Nurse Communication Mobility status;Precautions        Time: [id number] OT Time Calculation (min): 17 min  Charges: OT General Charges $OT Visit: 1 Visit OT Treatments $Self Care/Home Management : 8-22 mins  Corinne Ports E. Derren Suydam, COTA/L Acute  Rehabilitation Services [place] 01/02/2020, 11:02 AM

## 2020-01-02 NOTE — Progress Notes (Signed)
Dr. Christella Noa spoke with patient and decided to cancel surgery in order to have the patient worked up for a possible stroke. Report called to floor. Patient stable and wife informed of plans. Will continue to monitor patient.

## 2020-01-02 NOTE — Progress Notes (Signed)
Patient ID: Ryan Rice, male   DOB: 1951/05/09, 69 y.o.   MRN: [id number] BP (!) 151/82 (BP Location: Right Arm)   Pulse (!) 57   Temp 97.8 F (36.6 C) (Oral)   Resp 14   Ht 5\' 11"  (1.803 m)   Wt 86.6 kg   SpO2 100%   BMI 26.64 kg/m  Alert and oriented x 4 I spoke with the radiologist who reviewed the MRI from today. There is a new lesion visible on the scan, and he believes with good reason that this may be an infarct mimicking a tumor. I will speak with neurology tomorrow and have the stroke team evaluate Ryan Rice.  For now the biopsy is on hold. I have explained the situation to Ryan Rice.

## 2020-01-03 ENCOUNTER — Inpatient Hospital Stay (HOSPITAL_COMMUNITY): Payer: Medicare HMO

## 2020-01-03 DIAGNOSIS — I6389 Other cerebral infarction: Secondary | ICD-10-CM

## 2020-01-03 DIAGNOSIS — I6340 Cerebral infarction due to embolism of unspecified cerebral artery: Secondary | ICD-10-CM | POA: Insufficient documentation

## 2020-01-03 DIAGNOSIS — I63132 Cerebral infarction due to embolism of left carotid artery: Secondary | ICD-10-CM | POA: Diagnosis not present

## 2020-01-03 DIAGNOSIS — D496 Neoplasm of unspecified behavior of brain: Secondary | ICD-10-CM | POA: Diagnosis not present

## 2020-01-03 LAB — ECHOCARDIOGRAM COMPLETE
Height: 71 in
Weight: 3056 oz

## 2020-01-03 LAB — GLUCOSE, CAPILLARY
Glucose-Capillary: 175 mg/dL — ABNORMAL HIGH (ref 70–99)
Glucose-Capillary: 172 mg/dL — ABNORMAL HIGH (ref 70–99)
Glucose-Capillary: 223 mg/dL — ABNORMAL HIGH (ref 70–99)
Glucose-Capillary: 167 mg/dL — ABNORMAL HIGH (ref 70–99)

## 2020-01-03 MED ORDER — ASPIRIN EC 81 MG PO TBEC
81.0000 mg | DELAYED_RELEASE_TABLET | Freq: Every day | ORAL | Status: DC
  Administered 2020-01-03 – 2020-01-04 (×2): 81 mg via ORAL
  Filled 2020-01-03 (×2): qty 1

## 2020-01-03 MED ORDER — DIPHENHYDRAMINE HCL 25 MG PO CAPS: 50.0000 mg | ORAL_CAPSULE | Freq: Once | ORAL | Status: DC

## 2020-01-03 MED ORDER — DIPHENHYDRAMINE HCL 50 MG/ML IJ SOLN: 50.0000 mg | Freq: Once | INTRAMUSCULAR | Status: DC

## 2020-01-03 MED ORDER — ENALAPRILAT 1.25 MG/ML IV SOLN
0.6250 mg | Freq: Once | INTRAVENOUS | Status: AC
  Administered 2020-01-03: 0.625 mg via INTRAVENOUS
  Filled 2020-01-03: qty 0.5

## 2020-01-03 MED ORDER — PREDNISONE 50 MG PO TABS
50.0000 mg | ORAL_TABLET | Freq: Four times a day (QID) | ORAL | Status: DC
  Administered 2020-01-03: 50 mg via ORAL
  Filled 2020-01-03: qty 1

## 2020-01-03 NOTE — Progress Notes (Signed)
  Echocardiogram 2D Echocardiogram has been performed.  Darlina Sicilian M 01/03/2020, 12:33 PM

## 2020-01-03 NOTE — Progress Notes (Signed)
PROGRESS NOTE  Ryan Rice [id number] DOB: [birth year] DOA: 12/29/2019 PCP: Drosinis, Pamalee Leyden, PA-C  Brief History    Ryan Rice is a 69 y.o. male with medical history significant of PVD; renal transplant (2013); HTN; DM; and afib presenting with AMS.  He presented to [facility] last night for AMS.  His wife noticed Saturday that he wasn't talking much, was staring.  When asked a question, he would hesitate and stutter.  They were concerned about a ministroke.  He did not seem to have other symptoms.  She saw a Ryan Rice of this Friday but Saturday it was much worse.  No headaches or vision changes.  He has noticed expressive aphasia. Occasional tremor.  Intermittent urinary incontinence.   He is usually a talker and less able to do that.  No apparent changes in executive function.  The patient denies unintentional weight loss or night sweats.  He is eating and drinking ok.  He was recently taken off of his anticoagulation by his PCP in preparation for a prostate biopsy that was to have taken place this week at [facility]. The patient has had an elevated PSA. It was 9.34 in 10/2019, up from 6.79 in 05/2019.  He also has left hip and leg pain.  This has been going on for a couple of months and is about the same.  It hurts in his hip and radiates down his entire leg.  No back pain.   Neurosurgery has been consulted. The plan was to perform brain biopsy on 01/02/2020. However, repeat MRI performed prior to procedure demonstrated new infarct. The procedure has been postponed until the patient has completed work up for the CVA. Neurology has been consulted and stroke work up initiated.  Consultants   Neurosurgery  Procedures   None  Antibiotics   Anti-infectives (From admission, onward)   Start     Dose/Rate Route Frequency Ordered Stop   01/02/20 0000  ceFAZolin (ANCEF) IVPB 2g/100 mL premix  Status:  Discontinued     2 g 200 mL/hr over 30 Minutes Intravenous 30 min pre-op 01/01/20 2039 01/02/20  1958   01/02/20 0000  vancomycin (VANCOCIN) IVPB 1000 mg/200 mL premix  Status:  Discontinued     1,000 mg 200 mL/hr over 60 Minutes Intravenous 60 min pre-op 01/01/20 2039 01/02/20 1958   12/30/19 1100  sulfamethoxazole-trimethoprim (BACTRIM) 400-80 MG per tablet 1 tablet     1 tablet Oral Every M-W-F 12/30/19 1009       Subjective  The patient is resting comfortably. No new complaints.  Objective   Vitals:  Vitals:   01/03/20 0825 01/03/20 1304  BP: (!) 139/53 (!) 163/79  Pulse: 79 62  Resp: 18 18  Temp: 98.3 F (36.8 C) 97.8 F (36.6 C)  SpO2: 99% 99%   Exam:  Constitutional:   The patient is awake, alert, and oriented x 3. No acute distress.  Respiratory:   No increased work of breathing.  No wheezes, rales, or rhonchi  No tactile fremitus Cardiovascular:   Regular rate and rhythm  No murmurs, ectopy, or gallups.  No lateral PMI. No thrills. Abdomen:   Abdomen is soft, non-tender, non-distended  No hernias, masses, or organomegaly  Normoactive bowel sounds.  Musculoskeletal:   No cyanosis, clubbing, or edema Skin:   No rashes, lesions, ulcers  palpation of skin: no induration or nodules Neurologic:   CN 2-12 intact  Sensation all 4 extremities intact  Motor function is intact. Psychiatric:   Mental status o Mood,  affect appropriate o Orientation to person, place, time   judgment and insight appear intact  I have personally reviewed the following:   Today's Data   Vitals, BMP, CBC  Imaging   MRI Brain 12/30/2019, 01/02/2020  MRA Brain 01/03/2020  Carotid ultrasound 01/03/2020  Echocardiogram: pending  Scheduled Meds:  atorvastatin  10 mg Oral q1800   brimonidine  1 drop Right Eye BID   cinacalcet  60 mg Oral Daily   dexamethasone (DECADRON) injection  8 mg Intravenous Q6H   docusate sodium  100 mg Oral BID   dorzolamide-timolol  1 drop Right Eye BID   famotidine  20 mg Oral Daily   finasteride  5 mg Oral Daily    folic acid  1 mg Oral Daily   insulin aspart  0-15 Units Subcutaneous TID WC   insulin aspart  0-5 Units Subcutaneous QHS   insulin glargine  20 Units Subcutaneous Daily   labetalol  100 mg Oral BID   magnesium oxide  400 mg Oral Daily   mupirocin ointment  1 application Nasal BID   mycophenolate  360 mg Oral BID   NIFEdipine  60 mg Oral Daily   sulfamethoxazole-trimethoprim  1 tablet Oral Q M,W,F   tacrolimus  2 mg Oral BID   tamsulosin  0.4 mg Oral QPC breakfast   Continuous Infusions:  lactated ringers 10 mL/hr at 01/02/20 1912    Principal Problem:   Brain tumor ([place]) Active Problems:   AKI (acute kidney injury) ([place])   Kidney transplant recipient   Diabetes ([place])   Glaucoma   Hip pain, bilateral   Elevated PSA   Atrial fibrillation, chronic ([place])   Cerebral embolism with cerebral infarction   LOS: 4 days   A & P  Brain tumor: Patient with acute onset of confusion, expressive aphasia. 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 This is the source of his symptoms. 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 Neurosurgery has been consulted, and the patient has been started on IV Decadron 8 mg q 6 hrs. Neurosurgery plans to take the patient to the OR for a biopsy later today. Although, PT has stated that the patient could go home if family were able to provide assistance and support, it seems that this is not possible. The patient's wife has recently undergone surgery for lung cancer. She states that she is unable to care for the patient in her current state. She requests that the patient remain as inpatient pending surgery. The patient's anticoagulation remains held both in preparation for the brain biopsy, the prostate biopsy, and due to the finding of petechia hemorrhages noted on MRI. EEG has been ordered, and results are pending as the patient has been found having staring episodes. PT/OT/ST will be asked to evaluate the patient.    Sub acute CVA: Brain biopsy has been delayed. Stroke work up initiated. neurology  Aniscoria: Likely due to brain mass. Unlikely to be due to CVA per neurology. Pt is on steroids to address vasogenic edema. Plan is for brain biopsy after stroke work up is completed.   Elevated PSA: Patient had planned to have prostate biopsy on 01/01/2020 at [facility]. Family is deciding if the want the patient to go ahead and have the procedure done here, or wait and have it done at [facility] as originally planned. The patient is also experiencing urinary incontinence. He will be continued on proscar and flomax. He is taking bactrim 3x  a week for UTI prophylaxis.  AKI with h/o renal transplantation: No recent labs since almost a year ago, but currently with significantly worsened creatinine/GFR. This may be associated with decreased PO intake in the setting of recent confusion. Creatinine is increasing. Will check a renal ultrasound. Continue IV fluids. Also will avoid nephrotoxic medications and hypotension. He will be continued on Sensipar. Voltaren gel will be held. Will consult nephrology to monitor the status of his renal transplant. He will be continued on prograf and myfortic. Will monitor creatinine, electrolytes, and volume status.  DM II: Recent A1c 6.8, indicating good control. PO meds (Januvia, Gluocphage, Starlix, Actos) have been held. He will receive Lantus 14 units. His glucoses will be followed with FSBS and SSI. For the last 24 hours glucoses have run from 107 - 175.  HTN: Continue Nifedipine and Labetalol. Blood pressures under fair control.  Atrial fibrillation: Rate controlled with labetalol. Coumadin is on hold due to upcoming prostate biopsy/brain biopsy/petechial hemorrhages on MRI Brain. Monitor on telemetry.  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 He complains of pain along the left leg as well as gait instability that appears to be  related to the L hip -For now, pain management as needed with eventual plan for orthopedics consult as outpatient and likely to need THR.  Glaucoma: Continue Alphagan, Cosopt  HLD: Continue Lipitor  Hyperkalemia: Improved with IV fluids. Monitor.  I have seen and examined this patient myself. I have spent 34 minutes in his evaluation and care.  DVT prophylaxis:  SCDs Code Status:  Full - confirmed with patient/family Family Communication: No family available. Disposition Plan: Patient is from home. Plan is to discharge to home pending patient's Brain biopsy and sign off of neurosurgery and clearance from them for discharge.  Elaijah Munoz, DO [facility] Direct contact: see [email or web address]  7PM-7AM contact night coverage as above 01/03/2020, 2:00 PM  LOS: 1 day

## 2020-01-03 NOTE — Progress Notes (Signed)
Patient ID: Ryan Rice, male   DOB: 1951-03-16, 69 y.o.   MRN: [id number] BP 116/65 (BP Location: Right Arm)   Pulse (!) 56   Temp 97.8 F (36.6 C) (Oral)   Resp 18   Ht 5\' 11"  (1.803 m)   Wt 86.6 kg   SpO2 100%   BMI 26.64 kg/m  Currently sleeping. Neurology has started their evaluation.  Will sign off, call if there are questions

## 2020-01-03 NOTE — Progress Notes (Signed)
Carotid artery duplex has been completed. Preliminary results can be found in CV Proc through chart review.   01/03/20 12:11 PM Ryan Rice RVT

## 2020-01-03 NOTE — Consult Note (Signed)
Stroke Neurology Consultation Note  Consult Requested by: Dr. Benny Lennert  Reason for Consult: stroke  Consult Date: 01/03/20  The history was obtained from the wife.  During history and examination, all items were able to obtain unless otherwise noted.  History of Present Illness:  Ryan Rice is a 69 y.o. African American male with PMH of PVD, renal transplant 2013, HTN, DM, AFIB on coumadin until 12/23/19, right retinal detachment 2019 s/p surgery admitted for aphasia and confusion. As per wife, pt was at baseline 4/1 but seems to have some speech changes 4/2 but not alarming. On 4/3 pt had difficulty speaking, only answer questions with one word, seems confused but no arm leg weakness. He was sent to ED on 4/4, and CT and MRI showed left frontal mass lesion without enhancement. NSG envolved for possible brain biopsy. However, repeat MRI 4/8 showed new left parietal infarct. Taken together, concerning for left MCA and ACA stroke with hemorrhagic conversion. Neurology was consulted.  Pt has afib on coumadin. However, it was discontinued on 3/29 in preparation of prostate biopsy scheduled on 4/7. INR 1.1 on admission. Pt has ESRD on HD in the past but had renal transplant in 2013. He also had right retinal detachment s/p surgery 2 years ago which may explain his right pupil irregular shape.   LSN: 12/26/19 tPA Given: No: outside window, hemorrhagic conversion  Past Medical History:  Diagnosis Date  . Allergic rhinitis   . BPH (benign prostatic hyperplasia)   . Chronic atrial fibrillation ([place])   . Diabetes mellitus ([place])   . DVT (deep venous thrombosis) ([facility])    LLE  . Hematochezia   . HTN (hypertension)   . Hypomagnesemia 03/20/2019  . Kidney transplant recipient   . PVD (peripheral vascular disease) ([place])     Past Surgical History:  Procedure Laterality Date  . KIDNEY TRANSPLANT      Family History  Problem Relation Age of Onset  . Stroke Mother   . Heart disease Father   .  Diabetes Father   . Diabetes Sister   . Diabetes Brother   . Heart disease Brother     Social History:  reports that he quit smoking about 35 years ago. He quit smokeless tobacco use about 6 years ago. He reports current alcohol use. He reports that he does not use drugs.  Allergies:  Allergies  Allergen Reactions  . Iodinated Diagnostic Agents Hives  . Tape Hives    No current facility-administered medications on file prior to encounter.   Current Outpatient Medications on File Prior to Encounter  Medication Sig Dispense Refill  . artificial tears (LACRILUBE) OINT ophthalmic ointment Place into the left eye at bedtime as needed for dry eyes. 1 Tube 1  . atorvastatin (LIPITOR) 10 MG tablet TAKE 1 TABLET (10 MG TOTAL) BY MOUTH DAILY AT 6 PM. 30 tablet 0  . brimonidine (ALPHAGAN) 0.2 % ophthalmic solution Place 1 drop into the right eye 2 (two) times daily.    . cinacalcet (SENSIPAR) 60 MG tablet Take 60 mg by mouth daily.    . diclofenac Sodium (VOLTAREN) 1 % GEL Apply 4 g topically 2 (two) times daily. Apply to hips and legs    . dorzolamide-timolol (COSOPT) 22.3-6.8 MG/ML ophthalmic solution Place 1 drop into the right eye 2 (two) times a day.    . famotidine (PEPCID) 20 MG tablet Take 1 tablet (20 mg total) by mouth daily. 30 tablet 1  . finasteride (PROSCAR) 5 MG tablet Take 5  mg by mouth daily.    . folic acid (FOLVITE) 1 MG tablet TAKE 1 TABLET BY MOUTH EVERY DAY (Patient taking differently: Take 1 mg by mouth daily. ) 30 tablet 0  . JANUVIA 100 MG tablet Take 100 mg by mouth daily.    Marland Kitchen labetalol (NORMODYNE) 100 MG tablet Take 1 tablet (100 mg total) by mouth 2 (two) times daily.    . magnesium oxide (MAG-OX) 400 MG tablet Take 400 mg by mouth daily.    . metFORMIN (GLUCOPHAGE-XR) 500 MG 24 hr tablet Take 500 mg by mouth 2 (two) times daily.    . mycophenolate (MYFORTIC) 180 MG EC tablet Take 360 mg by mouth 2 (two) times daily.     . nateglinide (STARLIX) 120 MG tablet Take  120 mg by mouth 2 (two) times daily before a meal. On hold if BG is < 130    . NIFEdipine (ADALAT CC) 60 MG 24 hr tablet Take 60 mg by mouth daily.    . pioglitazone (ACTOS) 30 MG tablet Take 15 mg by mouth daily.    . predniSONE (DELTASONE) 10 MG tablet Take 10 mg by mouth daily with breakfast.    . sulfamethoxazole-trimethoprim (BACTRIM) 400-80 MG tablet Take 1 tablet by mouth every Monday, Wednesday, and Friday.     . tacrolimus (PROGRAF) 1 MG capsule Take 2 mg by mouth 2 (two) times daily.     . tamsulosin (FLOMAX) 0.4 MG CAPS capsule Take 0.4 mg by mouth daily after breakfast.     . warfarin (COUMADIN) 5 MG tablet Take 2.5-5 mg by mouth See admin instructions. Take 2.5mg  on MON Wed THUR FRI SAT, then take 5mg  on Tues and SUN.      Review of Systems: A full ROS was attempted today and was able to be performed from wife.  Systems assessed include - Constitutional, Eyes, HENT, Respiratory, Cardiovascular, Gastrointestinal, Genitourinary, Integument/breast, Hematologic/lymphatic, Musculoskeletal, Neurological, Behavioral/Psych, Endocrine, Allergic/Immunologic - with pertinent responses as per HPI.  Physical Examination: Temp:  [97.7 F (36.5 C)-98.3 F (36.8 C)] 98.3 F (36.8 C) (04/09 0825) Pulse Rate:  [52-84] 79 (04/09 0825) Resp:  [14-18] 18 (04/09 0825) BP: (139-194)/(53-90) 139/53 (04/09 0825) SpO2:  [99 %-100 %] 99 % (04/09 0825) Weight:  [86.6 kg] 86.6 kg (04/08 1910)  General - well nourished, well developed, in no apparent distress.    Ophthalmologic - fundi not visualized due to noncooperation.    Cardiovascular - irregularly irregular heart rate and rhythm.  Neuro - awake alert, able to tell me his name and age, able to speak sentences " I am fine"" you can call her" but majority of his sentences were word salad.  Able to follow most of the simple commands, but not all of them.  Not able to name or repeat.  Seems to have right hemianopia.  Left pupil 2 mm, reactive to light.   Right pupil over shape due to prior surgery, not reactive to light.  Mild right nasolabial fold flattening.  Tongue midline.  Moving all extremities symmetrically and equally.  Sensation subjective symmetrical, bilateral finger-to-nose intact.  Gait not tested.  Data Reviewed: DG Ankle Complete Left  Result Date: 12/29/2019 CLINICAL DATA:  Pain status post fall EXAM: LEFT ANKLE COMPLETE - 3+ VIEW COMPARISON:  None. FINDINGS: There is no acute displaced fracture. No dislocation. There are degenerative changes of the mortise joint. Advanced vascular calcifications are noted. IMPRESSION: No acute displaced fracture or dislocation. Degenerative changes of the mortise joint. Advanced vascular  calcifications. Electronically Signed   By: Constance Holster M.D.   On: 12/29/2019 20:46   CT Head Wo Contrast  Result Date: 12/29/2019 CLINICAL DATA:  Confusion EXAM: CT HEAD WITHOUT CONTRAST TECHNIQUE: Contiguous axial images were obtained from the base of the skull through the vertex without intravenous contrast. COMPARISON:  None. FINDINGS: Brain: There is edema within the left frontal lobe concerning for underlying frontal lobe mass lesion with surrounding edema. No midline shift or hemorrhage. Vascular: No hyperdense vessel or unexpected calcification. Skull: No acute calvarial abnormality. Sinuses/Orbits: Visualized paranasal sinuses and mastoids clear. Orbital soft tissues unremarkable. Other: None IMPRESSION: Edema within the anterior left frontal lobe concerning for vasogenic edema surrounding and underlying mass lesion. Recommend further evaluation with MRI with and without contrast. Electronically Signed   By: Rolm Baptise M.D.   On: 12/29/2019 20:50   MR BRAIN WO CONTRAST  Addendum Date: 01/02/2020   ADDENDUM REPORT: 01/02/2020 19:23 ADDENDUM: Study discussed by telephone with PA Ferne Reus and Dr. Marylyn Ishihara CABBELL. And we agreed that the stereotactic brain biopsy should be deferred for now, in favor of  further stroke workup. Electronically Signed   By: Genevie Ann M.D.   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 Nonenhancing lesion, stereotactic surgical planning. EXAM: MRI HEAD WITHOUT CONTRAST TECHNIQUE: Multiplanar, multiecho pulse sequences of the brain and surrounding structures were obtained without intravenous contrast. COMPARISON:  Recent brain MRI without and with contrast 12/30/2019. FINDINGS: Brain: Relatively large 6-7 cm area of confluent abnormal T2 and FLAIR hyperintensity redemonstrated in the anterior left frontal lobe, affecting both white matter and overlying cortex (series 4, image 21). Superimposed confluent and gyriform susceptibility along the margins of the lesion, especially medially, suggests significant hemosiderin, and these areas were mildly hyperdense recently on noncontrast CT. Stable regional mass effect, most notably on the left frontal horn. There is abnormal diffusion restriction, a especially along the anterior superior margin of the mass on series 550, image 41), although some of the diffusion abnormality could also reflect susceptibility. However, there is also new gyriform abnormal diffusion in the posterior and lateral left temporal and occipital lobes (series 550, image 26) with mild associated gyriform FLAIR hyperintensity. And interestingly there is also punctate new susceptibility artifact along the anterior margin of that abnormality on series 7, image 59. No other abnormal diffusion. Chronic cortical encephalomalacia in the contralateral posterior right operculum and also at the occipital lobe is again noted. And these areas also demonstrate hemosiderin. No midline shift, mass effect, evidence of mass lesion, ventriculomegaly, extra-axial collection. Cervicomedullary junction and pituitary are within normal limits. Vascular: Major intracranial vascular flow voids are stable, preserved.  Skull and upper cervical spine: Partially visible multilevel advanced cervical spine degeneration on series 3 today. Multilevel mild spinal stenosis. Visualized bone marrow signal is within normal limits. Sinuses/Orbits: Stable, negative. Other: Mastoids remain clear. IMPRESSION: 1. Stable anterior left frontal lobe lesion from the recent 12/30/2019 MRI, which in addition to confluent T2/FLAIR hyperintensity demonstrates abundant susceptibility artifact suggesting blood products. 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 New confluent area of posterior left hemisphere gyriform restricted diffusion, with only minimal FLAIR hyperintensity at this time but also punctate new susceptibility. Furthermore, there are small areas of chronic encephalomalacia in the right hemisphere with hemosiderin. 3. Therefore, consider an alternative diagnosis for #1 of Subacute Infarct with petechial hemorrhage. Venous Infarcts could also have this appearance, although  the superior sagittal sinus appears patent. Other stroke/tumor mimics such as MELAS were considered but felt less likely. CTA head and neck may be valuable considering the acute ischemia in #2. Electronically Signed: By: Genevie Ann M.D. On: 01/02/2020 18:33   MR BRAIN W WO CONTRAST  Result Date: 12/30/2019 CLINICAL DATA:  Encephalopathy. EXAM: MRI HEAD WITHOUT AND WITH CONTRAST TECHNIQUE: Multiplanar, multiecho pulse sequences of the brain and surrounding structures were obtained without and with intravenous contrast. CONTRAST:  49mL GADAVIST GADOBUTROL 1 MMOL/ML IV SOLN COMPARISON:  Head CT 12/29/2019 FINDINGS: BRAIN: There is a large area of abnormal signal within the left frontal lobe, with areas of restricted diffusion, facilitated diffusion and magnetic susceptibility effect. There is no associated contrast enhancement. There is mass effect on the frontal horn of the left lateral ventricle with 1-2 mm of rightward midline shift of the  left cingulate gyrus. No hydrocephalus. 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 Midline structures are normal. There is magnetic susceptibility effect at multiple locations within the left frontal lesion, roughly corresponding to relatively hyperdense areas on the earlier head CT. VASCULAR: Major flow voids are preserved. Susceptibility-sensitive sequences show no chronic microhemorrhage or superficial siderosis. SKULL AND UPPER CERVICAL SPINE: Normal calvarium and skull base. Visualized upper cervical spine and soft tissues are normal. SINUSES/ORBITS: No paranasal sinus fluid levels or advanced mucosal thickening. No mastoid or middle ear effusion. Normal orbits. IMPRESSION: 1. Area of vasogenic edema within the left frontal lobe without associated contrast enhancement. 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 2. Areas of magnetic susceptibility effect within the periphery of the left frontal lobe lesion may indicate venous congestion or late subacute to chronic petechial hemorrhage. 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 Electronically Signed   By: Ulyses Jarred M.D.   On: 12/30/2019 01:48   EEG adult  Result Date: 12/30/2019 Lora Havens, MD     12/30/2019  5:01 PM Patient Name: Fedrick Southward MRN: [id number] Epilepsy Attending: Lora Havens Referring Physician/Provider: Dr. Karmen Bongo Date: 12/30/2019 Duration: 24.26 mins Patient history: 69 year old male who presented for episodes of transient alteration of awareness. EEG to evaluate for seizures. Level of alertness: Awake AEDs during EEG study: None Technical aspects: This EEG study was done with scalp electrodes positioned according to the 10-20 International system of electrode placement. Electrical activity was acquired at a sampling rate of 500Hz  and reviewed with a high frequency filter of 70Hz  and a low frequency filter of 1Hz . EEG data were recorded continuously and  digitally stored. Description: No clear posterior dominant rhythm was seen. EEG showed generalized polymorphic 8-10 hz alpha activity as well as intermittent left frontal 2-3hz  delta slowing. Hyperventilation and photic stimulation were not performed. ABNORMALITY - Intermittent slow, left frontal IMPRESSION: This study is suggestive of cortical dysfunction in left frontal region likely secondary to underlying mass. No seizures or definite epileptiform discharges were seen throughout the recording. Lora Havens   DG Hip Unilat W or Wo Pelvis 2-3 Views Left  Result Date: 12/29/2019 CLINICAL DATA:  Pain status post fall EXAM: DG HIP (WITH OR WITHOUT PELVIS) 2-3V LEFT COMPARISON:  None. FINDINGS: There are end-stage degenerative changes of both hips, left worse than right. There is no displaced fracture. No dislocation. Vascular calcifications are noted. IMPRESSION: 1. No displaced fracture or dislocation. 2. End-stage degenerative changes of both hips, left worse than right. Electronically Signed   By: Jamie Kato.D.  On: 12/29/2019 20:45    Assessment: 69 y.o. male with history of PMH of PVD, renal transplant 2013, HTN, DM, AFIB on coumadin until 12/23/19, right retinal detachment 2019 s/p surgery presenting with aphasia and confusion. On 4/4 CT and MRI showed L frontal mass lesion and NSG considering bx. Now with new L parietal infarct, concerning for stroke w/ hemorrhagic transformation.   Stroke:  likely  L ACA  Infarcts w/ hemorrhage conversion as well as left MCA infarcts, embolic secondary to known AF not on coumadin  CT head 4/4  Anterior L frontal lobe edema, ? Underlying mass  MRI brain w/w/o 4/4 L frontal lobe edema w/o enhancement. Suggestive of brain tumor. Area w/ magnetic susceptibility may indicate venous congestion vs petechial hemorrhage   MRI 4/8  stable L frontal lobe lesion w/ petechial hemorrhage. New punctate  posterior L gyriform DWI enhancement. Old R brain  encephalomalacia w/ hemosiderin.   Do not feel brain biopsy is needed at this time  MRA  Unremarkable   MRV negative. Hypoplastic L transverse sinus stable    CT repeat pending in am to follow up of left ACA hemorrhagic conversion  Carotid Doppler  B ICA 1-39% stenosis, VAs antegrade   2D Echo EF 55-60%. No source of embolus. B atria dilated  LDL pending    HgbA1c 7.1   SCDs for VTE prophylaxis  On warfarin PTA but on hold d/t planned outpt prostate bx, now on ASA 81mg . If CT repeat showed resolving left ACA hemorrhagic conversion, may consider heparin IV for stroke prevention.  Therapy recommendations:  pending   Disposition:  pending   Atrial Fibrillation  Home anticoagulation:  warfarin daily , but was stopped 3/29 in anticipation of prostate bx scheduled on 4/7  Last INR 1.1  now on ASA 81mg .   If CT repeat showed resolving left ACA hemorrhagic conversion, may consider heparin IV for stroke prevention.   Hypertension  Stable  On home labetalol 100mg  bid  BP < 160 given hemorrhagic conversion  Long-term BP goal normotensive  Hyperlipidemia  Home meds:  lipitor 10, resumed in hospital  LDL pending, goal < 70  Continue statin at discharge  Diabetes type II, Uncontrolled  HgbA1c 7.1, goal < 7.0  CBGs  SSI  Close PCP follow-up for better DM control  Other Stroke Risk Factors  Advanced age  Former Cigarette smoker  ETOH use, advised to drink no more than 2 drink(s) a day  Family hx stroke (mother)  PVD  Other Active Problems  Hx ESRD s/p renal transplant 2013  Hx R retinal detachment s/p repair 2 yrs ago which may explain irregular pupil shape  L>R hip pain  Glaucoma   AKI creatinine 1.37-1.34-1.14  Thank you for this consultation and allowing [place] to participate in the care of this patient. We will follow with you.  Rosalin Hawking, MD PhD Stroke Neurology 01/03/2020 8:19 PM

## 2020-01-04 ENCOUNTER — Inpatient Hospital Stay (HOSPITAL_COMMUNITY): Payer: Medicare HMO

## 2020-01-04 DIAGNOSIS — D496 Neoplasm of unspecified behavior of brain: Secondary | ICD-10-CM | POA: Diagnosis not present

## 2020-01-04 DIAGNOSIS — I6389 Other cerebral infarction: Secondary | ICD-10-CM | POA: Diagnosis not present

## 2020-01-04 LAB — GLUCOSE, CAPILLARY
Glucose-Capillary: 178 mg/dL — ABNORMAL HIGH (ref 70–99)
Glucose-Capillary: 139 mg/dL — ABNORMAL HIGH (ref 70–99)
Glucose-Capillary: 192 mg/dL — ABNORMAL HIGH (ref 70–99)
Glucose-Capillary: 219 mg/dL — ABNORMAL HIGH (ref 70–99)

## 2020-01-04 LAB — CBC WITH DIFFERENTIAL/PLATELET
Abs Immature Granulocytes: 0.05 10*3/uL (ref 0.00–0.07)
Basophils Absolute: 0 10*3/uL (ref 0.0–0.1)
Basophils Relative: 0 %
Eosinophils Absolute: 0 10*3/uL (ref 0.0–0.5)
Eosinophils Relative: 0 %
HCT: 43.7 % (ref 39.0–52.0)
Hemoglobin: 14 g/dL (ref 13.0–17.0)
Immature Granulocytes: 1 %
Lymphocytes Relative: 4 %
Lymphs Abs: 0.4 10*3/uL — ABNORMAL LOW (ref 0.7–4.0)
MCH: 27 pg (ref 26.0–34.0)
MCHC: 32 g/dL (ref 30.0–36.0)
MCV: 84.4 fL (ref 80.0–100.0)
Monocytes Absolute: 0.4 10*3/uL (ref 0.1–1.0)
Monocytes Relative: 4 %
Neutro Abs: 8.3 10*3/uL — ABNORMAL HIGH (ref 1.7–7.7)
Neutrophils Relative %: 91 %
Platelets: 161 10*3/uL (ref 150–400)
RBC: 5.18 MIL/uL (ref 4.22–5.81)
RDW: 14.9 % (ref 11.5–15.5)
WBC: 9.1 10*3/uL (ref 4.0–10.5)
nRBC: 0 % (ref 0.0–0.2)

## 2020-01-04 LAB — BASIC METABOLIC PANEL
Anion gap: 10 (ref 5–15)
BUN: 41 mg/dL — ABNORMAL HIGH (ref 8–23)
CO2: 18 mmol/L — ABNORMAL LOW (ref 22–32)
Calcium: 9 mg/dL (ref 8.9–10.3)
Chloride: 107 mmol/L (ref 98–111)
Creatinine, Ser: 1.22 mg/dL (ref 0.61–1.24)
GFR calc Af Amer: >60 mL/min (ref >60)
GFR calc non Af Amer: >60 mL/min (ref >60)
Glucose, Bld: 172 mg/dL — ABNORMAL HIGH (ref 70–99)
Potassium: 4.5 mmol/L (ref 3.5–5.1)
Sodium: 135 mmol/L (ref 135–145)

## 2020-01-04 LAB — LIPID PANEL
Cholesterol: 224 mg/dL — ABNORMAL HIGH (ref 0–200)
HDL: 43 mg/dL (ref >40)
LDL Cholesterol: 164 mg/dL — ABNORMAL HIGH (ref 0–99)
Total CHOL/HDL Ratio: 5.2 RATIO
Triglycerides: 87 mg/dL (ref <150)
VLDL: 17 mg/dL (ref 0–40)

## 2020-01-04 MED ORDER — ATORVASTATIN CALCIUM 80 MG PO TABS
80.0000 mg | ORAL_TABLET | Freq: Every day | ORAL | Status: DC
  Administered 2020-01-04 – 2020-01-09 (×6): 80 mg via ORAL
  Filled 2020-01-04 (×6): qty 1

## 2020-01-04 MED ORDER — APIXABAN 5 MG PO TABS
5.0000 mg | ORAL_TABLET | Freq: Two times a day (BID) | ORAL | Status: DC
  Administered 2020-01-04 – 2020-01-10 (×12): 5 mg via ORAL
  Filled 2020-01-04 (×12): qty 1

## 2020-01-04 MED ORDER — METOPROLOL TARTRATE 12.5 MG HALF TABLET
12.5000 mg | ORAL_TABLET | Freq: Two times a day (BID) | ORAL | Status: DC
  Administered 2020-01-04 – 2020-01-10 (×10): 12.5 mg via ORAL
  Filled 2020-01-04 (×13): qty 1

## 2020-01-04 NOTE — Progress Notes (Signed)
PROGRESS NOTE  Ryan Rice [id number] DOB: Nov 08, 1950 DOA: 12/29/2019 PCP: Drosinis, Pamalee Leyden, PA-C  Brief History    Ryan Rice is a 69 y.o. male with medical history significant of PVD; renal transplant (2013); HTN; DM; and afib presenting with AMS.  He presented to [facility] last night for AMS.  His wife noticed Saturday that he wasn't talking much, was staring.  When asked a question, he would hesitate and stutter.  They were concerned about a ministroke.  He did not seem to have other symptoms.  She saw a [place] of this Friday but Saturday it was much worse.  No headaches or vision changes.  He has noticed expressive aphasia. Occasional tremor.  Intermittent urinary incontinence.   He is usually a talker and less able to do that.  No apparent changes in executive function.  The patient denies unintentional weight loss or night sweats.  He is eating and drinking ok.  He was recently taken off of his anticoagulation by his PCP in preparation for a prostate biopsy that was to have taken place this week at [facility]. The patient has had an elevated PSA. It was 9.34 in 10/2019, up from 6.79 in 05/2019.  He also has left hip and leg pain.  This has been going on for a couple of months and is about the same.  It hurts in his hip and radiates down his entire leg.  No back pain.   Neurosurgery has been consulted. The plan was to perform brain biopsy on 01/02/2020. However, repeat MRI performed prior to procedure demonstrated new infarct. The procedure has been postponed until the patient has completed work up for the CVA. Neurology has been consulted and stroke work up initiated.  Echocardiogram demonstrates EF of 55-60%, Nl LV function. No regional wall motion abnormalities. Mild LVH is present and LV diastolic function could not be evaluated. RV systolic function is normal. They were unable to assess PA pressure due to inadequate signal from tricuspid regurgitation. LA and RA were moderately dilated. LV  function has improved from previous. No intracardiac source of thrombus was seen.  Carotid ultrasound: BL ICA demonstrate 1-39% stenosis. Antegrade flow of bilateral vertebral arteries.   EKG demonstrates atrial fibrillation with a controlled ventricular response. Initiation of anticoagulation has been discussed with [name]. As the petechial hemorrhages seen on MRI occurred more than a week ago, [name] recommends starting Eliquis. Pharmacy has been consulted to manage the initiation of Eliquis.  Today the patient had a 2 second pause on telemetry. Labetalol 100 mg bid has been stopped and the patient has been changed to metoprolol 12.5 mg bid. Will continue to monitor for rate control and further arrhythmias.  Consultants  . Neurosurgery  Procedures  . None  Antibiotics   Anti-infectives (From admission, onward)   Start     Dose/Rate Route Frequency Ordered Stop   01/02/20 0000  ceFAZolin (ANCEF) IVPB 2g/100 mL premix  Status:  Discontinued     2 g 200 mL/hr over 30 Minutes Intravenous 30 min pre-op 01/01/20 2039 01/02/20 1958   01/02/20 0000  vancomycin (VANCOCIN) IVPB 1000 mg/200 mL premix  Status:  Discontinued     1,000 mg 200 mL/hr over 60 Minutes Intravenous 60 min pre-op 01/01/20 2039 01/02/20 1958   12/30/19 1100  sulfamethoxazole-trimethoprim (BACTRIM) 400-80 MG per tablet 1 tablet     1 tablet Oral Every M-W-F 12/30/19 1009       Subjective  The patient is resting comfortably. No new  complaints.  Objective   Vitals:  Vitals:   01/04/20 1148 01/04/20 1553  BP: (!) 153/95 121/69  Pulse: 70 65  Resp: 18 18  Temp: 98.2 F (36.8 C) 97.7 F (36.5 C)  SpO2: 100% 99%   Exam:  Constitutional:  . The patient is awake, alert, and oriented x 3. No acute distress.  Respiratory:  . No increased work of breathing. . No wheezes, rales, or rhonchi . No tactile fremitus Cardiovascular:  . Regular rate and rhythm . No murmurs, ectopy, or gallups. . No lateral  PMI. No thrills. Abdomen:  . Abdomen is soft, non-tender, non-distended . No hernias, masses, or organomegaly . Normoactive bowel sounds.  Musculoskeletal:  . No cyanosis, clubbing, or edema Skin:  . No rashes, lesions, ulcers . palpation of skin: no induration or nodules Neurologic:  . CN 2-12 intact . Sensation all 4 extremities intact . Motor function is intact. Psychiatric:  . Mental status o Mood, affect appropriate o Orientation to person, place, time  . judgment and insight appear intact  I have personally reviewed the following:   Today's Data  . Vitals, BMP, CBC, Lipid panel  Imaging  . MRI Brain 12/30/2019, 01/02/2020 . MRA Brain 01/03/2020 . Carotid ultrasound 01/03/2020 . Echocardiogram: pending . CT head without contrast: No evidence for frank hemorrhagic transformation.  Scheduled Meds: . apixaban  5 mg Oral BID  . atorvastatin  80 mg Oral q1800  . brimonidine  1 drop Right Eye BID  . cinacalcet  60 mg Oral Daily  . dexamethasone (DECADRON) injection  8 mg Intravenous Q6H  . docusate sodium  100 mg Oral BID  . dorzolamide-timolol  1 drop Right Eye BID  . famotidine  20 mg Oral Daily  . finasteride  5 mg Oral Daily  . folic acid  1 mg Oral Daily  . insulin aspart  0-15 Units Subcutaneous TID WC  . insulin aspart  0-5 Units Subcutaneous QHS  . insulin glargine  20 Units Subcutaneous Daily  . magnesium oxide  400 mg Oral Daily  . metoprolol tartrate  12.5 mg Oral BID  . mupirocin ointment  1 application Nasal BID  . mycophenolate  360 mg Oral BID  . NIFEdipine  60 mg Oral Daily  . sulfamethoxazole-trimethoprim  1 tablet Oral Q M,W,F  . tacrolimus  2 mg Oral BID  . tamsulosin  0.4 mg Oral QPC breakfast   Continuous Infusions: . lactated ringers 10 mL/hr at 01/02/20 1912    Principal Problem:   Brain tumor [facility]) Active Problems:   AKI (acute kidney injury) ([place])   Kidney transplant recipient   Diabetes ([place])   Glaucoma   Hip pain, bilateral    Elevated PSA   Atrial fibrillation, chronic ([place])   Cerebral embolism with cerebral infarction   LOS: 5 days   A & P  Brain tumor: Patient with acute onset of confusion, expressive aphasia. 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 This is the source of his symptoms. 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 Neurosurgery has been consulted, and the patient has been started on IV Decadron 8 mg q 6 hrs. Neurosurgery plans to take the patient to the OR for a biopsy later today. Although, PT has stated that the patient could go home if family were able to provide assistance and support, it seems that this is not possible. The patient's wife has recently undergone surgery for lung cancer. She states that she  is unable to care for the patient in her current state. She requests that the patient remain as inpatient pending surgery. As the patient was found to have new CVA on repeat MRI prior to planned biopsy, the brain biopsy was held in lieu of stroke work up. Neurology was consulted. [name] has evaluated the patient and feels that Brain Biopsy is no longer necessary. Stroke work up has been completed. The patient is found to be in atrial fibrillation. Initiation of anticoagulation has been discussed with [name]. As the petechial hemorrhages seen on MRI occurred more than a week ago, [name] recommends starting Eliquis. Pharmacy has been consulted to manage the initiation of Eliquis.  Sub acute CVA: Brain biopsy has been delayed. Stroke work up initiated.  Neurology was consulted. [name] has evaluated the patient and feels that Brain Biopsy is no longer necessary. Stroke work up has been completed. The patient is found to be in atrial fibrillation. Initiation of anticoagulation has been discussed with [name]. As the petechial hemorrhages seen on MRI occurred more than a week ago, [name] recommends starting Eliquis. Pharmacy has been consulted to manage the  initiation of Eliquis.  Aniscoria: Likely due to brain mass. Unlikely to be due to CVA per neurology. Pt is on steroids to address vasogenic edema. Plan is for brain biopsy after stroke work up is completed.   Elevated PSA: Patient had planned to have prostate biopsy on 01/01/2020 at [facility]. Family is deciding if the want the patient to go ahead and have the procedure done here, or wait and have it done at [facility] as originally planned. The patient is also experiencing urinary incontinence. He will be continued on proscar and flomax. He is taking bactrim 3x a week for UTI prophylaxis.  AKI with h/o renal transplantation: No recent labs since almost a year ago, but currently with significantly worsened creatinine/GFR. This may be associated with decreased PO intake in the setting of recent confusion. Creatinine is increasing. Will check a renal ultrasound. Continue IV fluids. Also will avoid nephrotoxic medications and hypotension. He will be continued on Sensipar. Voltaren gel will be held. Will consult nephrology to monitor the status of his renal transplant. He will be continued on prograf and myfortic. Will monitor creatinine, electrolytes, and volume status.  DM II: Recent A1c 6.8, indicating good control. PO meds (Januvia, Gluocphage, Starlix, Actos) have been held. He will receive Lantus 14 units. His glucoses will be followed with FSBS and SSI. For the last 24 hours glucoses have run from 107 - 175.  HTN: Continue Nifedipine and Labetalol. Blood pressures under fair control.  Atrial fibrillation: Rate controlled with labetalol. Coumadin is on hold due to upcoming prostate biopsy/brain biopsy/petechial hemorrhages on MRI Brain. Monitor on telemetry.  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 He complains of pain along the left leg as well as gait instability that appears to be related to the L hip -For now, pain management as needed with  eventual plan for orthopedics consult as outpatient and likely to need THR.  Glaucoma: Continue Alphagan, Cosopt  HLD: Continue Lipitor  Hyperkalemia: Improved with IV fluids. Monitor.  I have seen and examined this patient myself. I have spent 37 minutes in his evaluation and care.  DVT prophylaxis:  SCDs Code Status:  Full - confirmed with patient/family Family Communication: No family available. Disposition Plan: Patient is from home. Plan is to discharge to  home pending patient's Brain biopsy and sign off of neurosurgery and clearance from them for discharge.  Natsuko Kelsay, DO [facility] Direct contact: see [email or web address]  7PM-7AM contact night coverage as above 01/04/2020, 4:20 PM  LOS: 1 day

## 2020-01-04 NOTE — Progress Notes (Signed)
ANTICOAGULATION CONSULT NOTE - Initial Consult  Pharmacy Consult for Apixaban Indication: Afib + CVA  Allergies  Allergen Reactions  . Iodinated Diagnostic Agents Hives  . Tape Hives    Patient Measurements: Height: 5\' 11"  (180.3 cm) Weight: 86.6 kg (191 lb) IBW/kg (Calculated) : 75.3  Vital Signs: Temp: 98.2 F (36.8 C) (04/10 1148) Temp Source: Oral (04/10 1148) BP: 153/95 (04/10 1148) Pulse Rate: 70 (04/10 1148)  Labs: Recent Labs    01/02/20 0430 01/04/20 0418  HGB 12.7* 14.0  HCT 39.1 43.7  PLT 130* 161  CREATININE 1.14 1.22    Estimated Creatinine Clearance: 60.9 mL/min (by C-G formula based on SCr of 1.22 mg/dL).   Medical History: Past Medical History:  Diagnosis Date  . Allergic rhinitis   . BPH (benign prostatic hyperplasia)   . Chronic atrial fibrillation ([place])   . Diabetes mellitus ([place])   . DVT (deep venous thrombosis) ([facility])    LLE  . Hematochezia   . HTN (hypertension)   . Hypomagnesemia 03/20/2019  . Kidney transplant recipient   . PVD (peripheral vascular disease) ([facility])     Assessment: 24 YOM on warfarin PTA for hx Afib - held PTA due to need for prostate biopsy, then admitted with AMS and thought to have a brain mass. Upon further review and evaluation - it is thought to represent infarcts with petechia hemorrhages and the stroke team was consulted. The hemorrhagic conversions have remained stable and pharmacy has been consulted to transition to Apixaban for anticoagulation.   Goal of Therapy:  Appropriate anticoagulation for indication and hepatic/renal function  Monitor platelets by anticoagulation protocol: Yes   Plan:  - Start Apixaban 5 mg bid - Will plan to educate prior to discharge - Will continue to monitor for any signs/symptoms of bleeding and will follow along with neuro for evaluation of stability of hemorrhagic conversions   Thank you for allowing pharmacy to be a part of this patient's care.  Alycia Rossetti, PharmD,  BCPS Clinical Pharmacist Clinical phone for 01/04/2020: [phone] 01/04/2020 3:02 PM   **Pharmacist phone directory can now be found on [email or web address] ([email or web address]).  Listed under [place].

## 2020-01-04 NOTE — Progress Notes (Signed)
STROKE TEAM PROGRESS NOTE   HISTORY OF PRESENT ILLNESS (per record) Ryan Rice is a 69 y.o. 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 As per wife, pt was at baseline 4/1 but seems to have some speech changes 4/2 but not alarming. On 4/3 pt had difficulty speaking, only answer questions with one word, seems confused but no arm leg weakness. He was sent to ED on 4/4, and CT and MRI showed left frontal mass lesion without enhancement. NSG envolved for possible brain biopsy. However, repeat MRI 4/8 showed new left parietal infarct. Taken together, concerning for left MCA and ACA stroke with hemorrhagic conversion. Neurology was consulted. Pt has afib on coumadin. However, it was discontinued on 3/29 in preparation of prostate biopsy scheduled on 4/7. INR 1.1 on admission. Pt has ESRD on HD in the past but had renal transplant in 2013. He also had right retinal detachment s/p surgery 2 years ago which may explain his right pupil irregular shape.   LSN: 12/26/19 tPA Given: No: outside window, hemorrhagic conversion   INTERVAL HISTORY .  Patient is sitting up in bed.  Continues to have expressive aphasia does follow commands well.  Vital signs stable.  No neurological changes.    OBJECTIVE Vitals:   01/04/20 0345 01/04/20 0500 01/04/20 0814 01/04/20 1148  BP: (!) 169/70  (!) 185/89 (!) 153/95  Pulse: (!) 55  (!) 108 70  Resp: 16  18 18   Temp: 98 F (36.7 C)  98 F (36.7 C) 98.2 F (36.8 C)  TempSrc:   Oral Oral  SpO2: 98% 98% 100% 100%  Weight:      Height:        CBC:  Recent Labs  Lab 01/02/20 0430 01/04/20 0418  WBC 7.4 9.1  NEUTROABS 6.8 8.3*  HGB 12.7* 14.0  HCT 39.1 43.7  MCV 83.5 84.4  PLT 130* [id number]    Basic Metabolic Panel:  Recent Labs  Lab 01/02/20 0430 01/04/20 0418  NA 136 135  K 4.2 4.5  CL 108  107  CO2 18* 18*  GLUCOSE 160* 172*  BUN 33* 41*  CREATININE 1.14 1.22  CALCIUM 8.6* 9.0    Lipid Panel:     Component Value Date/Time   CHOL 224 (H) 01/04/2020 0418   TRIG 87 01/04/2020 0418   HDL 43 01/04/2020 0418   CHOLHDL 5.2 01/04/2020 0418   VLDL 17 01/04/2020 0418   LDLCALC 164 (H) 01/04/2020 0418   HgbA1c:  Lab Results  Component Value Date   HGBA1C 7.1 (H) 12/30/2019   Urine Drug Screen: No results found for: LABOPIA, COCAINSCRNUR, LABBENZ, AMPHETMU, THCU, LABBARB  Alcohol Level No results found for: [place]  CT HEAD WO CONTRAST 01/04/2020 IMPRESSION:  1. No significant interval change in size of evolving subacute left ACA territory infarct with similar associated petechial hemorrhage. No evidence for frank hemorrhagic transformation or intraparenchymal hematoma.  2. Stable size of additional evolving ischemic infarct involving the posterior left temporoparietal and occipital region. No evidence for hemorrhagic transformation or significant regional mass effect.  3. No other new acute intracranial abnormality.   MR ANGIO HEAD WO CONTRAST 01/03/2020 IMPRESSION:  Negative MRI head    MR BRAIN WO CONTRAST 01/02/2020   IMPRESSION:  1. Stable anterior left frontal lobe lesion from the recent 12/30/2019 MRI, which in addition to confluent  T2/FLAIR hyperintensity demonstrates abundant susceptibility artifact suggesting blood products. 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 New confluent area of posterior left hemisphere gyriform restricted diffusion, with only minimal FLAIR hyperintensity at this time but also punctate new susceptibility. Furthermore, there are small areas of chronic encephalomalacia in the right hemisphere with hemosiderin.  3. Therefore, consider an alternative diagnosis for #1 of Subacute Infarct with petechial hemorrhage. Venous Infarcts could also have this appearance, although the superior sagittal sinus  appears patent. Other stroke/tumor mimics such as MELAS were considered but felt less likely. CTA head and neck may be valuable considering the acute ischemia in #2.   MR MRV HEAD WO CM 01/03/2020 IMPRESSION:  Negative for venous sinus thrombosis. Hypoplastic left transverse sinus appears congenital. This had a similar appearance on the prior MRI brain with contrast on 12/30/2019    ECHOCARDIOGRAM COMPLETE 01/03/2020 IMPRESSIONS   1. Left ventricular ejection fraction, by estimation, is 55 to 60%. The left ventricle has normal function. The left ventricle has no regional wall motion abnormalities. There is mild concentric left ventricular hypertrophy. Left ventricular diastolic function could not be evaluated.   2. Right ventricular systolic function is normal. The right ventricular size is normal. Tricuspid regurgitation signal is inadequate for assessing PA pressure.   3. Left atrial size was moderately dilated.   4. Right atrial size was mild to moderately dilated.   5. The mitral valve is normal in structure. No evidence of mitral valve regurgitation. No evidence of mitral stenosis.   6. The aortic valve is tricuspid. Aortic valve regurgitation is not visualized. Mild aortic valve stenosis. Comparison(s): Prior images reviewed side by side. The left ventricular function has improved.   VAS US CAROTID 01/04/2020 Summary:  Right Carotid: Velocities in the right ICA are consistent with a 1-39% stenosis.  Left Carotid: Velocities in the left ICA are consistent with a 1-39% stenosis.  Vertebrals: Bilateral vertebral arteries demonstrate antegrade flow.   Final    ECG atrial fibrillation - ventricular response 77 BPM (See cardiology reading for complete details)    PHYSICAL EXAM Blood pressure (!) 153/95, pulse 70, temperature 98.2 F (36.8 C), temperature source Oral, resp. rate 18, height 5\' 11"  (1.803 m), weight 86.6 kg, SpO2 100 %. Pleasant elderly African-American male not in distress.  . Afebrile. Head is nontraumatic. Neck is supple without bruit.    Cardiac exam no murmur or gallop. Lungs are clear to auscultation. Distal pulses are well felt. Neurological Exam : He is awake alert has expressive aphasia and can speak only few words and short sentences has some paraphasic errors as well as garbled speech at times.  Difficulty with naming, repetition.  Follows only simple midline and one-step commands.  Does not blink to threat on the right but does so on the left.  Right lower facial weakness.  Tongue midline.  Motor system exam able to move all 4 extremities well against gravity without focal weakness.  Sensation appears intact.  Gait not tested.     ASSESSMENT/PLAN Ryan Rice is a 69 y.o. 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 He did not receive IV t-PA due to late presentation (>4.5 hours from time of onset).  Possible TIA:  No acute stroke - evolving subacute left ACA territory infarct with similar associated petechial  hemorrhage  Resultant aphasia and right facial weakness code Stroke CT Head - not ordered  CT head - No significant interval change in size of evolving subacute left ACA territory infarct with similar associated petechial hemorrhage. No evidence for frank hemorrhagic transformation or intraparenchymal hematoma. Stable size of additional evolving ischemic infarct involving the posterior left temporoparietal and occipital region.  MRI head 01/03/20 - Negative MRI head     MRA head - negative  MR MRV HEAD WO CM - Negative for venous sinus thrombosis. Hypoplastic left transverse sinus appears congenital.  CTA H&N - not ordered  CT Perfusion - not ordered  Carotid Doppler - unremarkable  2D Echo - EF 55 to 60%. No cardiac source of emboli identified.   Sars [place]ona  Virus 2 - negative  LDL - 164  HgbA1c - 7.1  UDS - not ordered  VTE prophylaxis - SCDs Diet  Diet Order            Diet heart healthy/carb modified Room service appropriate? Yes; Fluid consistency: Thin  Diet effective now              warfarin daily prior to admission, now on eliquis   daily  Patient counseled to be compliant with his antithrombotic medications  Ongoing aggressive stroke risk factor management  Therapy recommendations:  HH PT  Disposition:  Pending  Hypertension  Home BP meds: Labetalol ; Adalat  Current BP meds: nifedipine ; labetalol  Blood pressure somewhat high at times but within post stroke/TIA parameters . Permissive hypertension (OK if < 220/120) but gradually normalize in 5-7 days  . Long-term BP goal normotensive  Hyperlipidemia  Home Lipid lowering medication: Lipitor 10 mg daily  LDL 164, goal < 70  Current lipid lowering medication: Lipitor 10 mg daily -> increase to lipitor 80  Continue statin at discharge  Diabetes  Home diabetic meds: Januvia ; Starlix ; metformin ; Actos  Current diabetic meds: insulin  HgbA1c 7.1, goal < 7.0 Recent Labs    01/03/20 2111 01/04/20 0610 01/04/20 1147  GLUCAP 167* 178* 139*    Other Stroke Risk Factors  Advanced age  Former cigarette smoker - quit  ETOH use, advised to drink no more than 1 alcoholic beverage per day.  Family hx stroke (mother)  Other Active Problems  Code status - Full code  Hx ESRD s/p renal transplant 2013  Hx R retinal detachment s/p repair 2 yrs ago which may explain irregular pupil shape  L>R hip pain  Glaucoma   AKI creatinine 1.37-1.34-1.14  NS consult for ? frontal mask - no longer be needed - NS signed off   Hospital day # 5 Patient remains aphasic and follow-up CT scan from yesterday shows petechial hemorrhage and subacute infarct which is now more than a week old.  Plan resume anticoagulation with Eliquis as his renal function seems  satisfactory.  We will consult pharmacy for the same.  Continue ongoing therapies.  Discussed with Dr. Benny Lennert.  Greater than 50% time during this 35-minute visit was spent on counseling and coordination of care and discussion with care team. Antony Contras, MD To contact Stroke Continuity provider, please refer to [email or web address] After hours, contact General Neurology

## 2020-01-04 NOTE — Progress Notes (Addendum)
Notified Swayze, MD on patient having a 2.0 pause on telemetry at 1250. patient Alert and asymptomatic.

## 2020-01-05 DIAGNOSIS — D496 Neoplasm of unspecified behavior of brain: Secondary | ICD-10-CM | POA: Diagnosis not present

## 2020-01-05 DIAGNOSIS — I6389 Other cerebral infarction: Secondary | ICD-10-CM | POA: Diagnosis not present

## 2020-01-05 LAB — GLUCOSE, CAPILLARY
Glucose-Capillary: 192 mg/dL — ABNORMAL HIGH (ref 70–99)
Glucose-Capillary: 226 mg/dL — ABNORMAL HIGH (ref 70–99)
Glucose-Capillary: 284 mg/dL — ABNORMAL HIGH (ref 70–99)
Glucose-Capillary: 267 mg/dL — ABNORMAL HIGH (ref 70–99)
Glucose-Capillary: 225 mg/dL — ABNORMAL HIGH (ref 70–99)

## 2020-01-05 NOTE — Plan of Care (Signed)
Patient stable, discussed POC with patient and spouse, agreeable with stroke plan, denies question/concerns at this time. Handbook at bedside.

## 2020-01-05 NOTE — Discharge Instructions (Signed)

## 2020-01-05 NOTE — Progress Notes (Signed)
PROGRESS NOTE  Ryan Rice [id number] DOB: 1951-07-14 DOA: 12/29/2019 PCP: Drosinis, Pamalee Leyden, PA-C  Brief History    Ryan Rice is a 69 y.o. male with medical history significant of PVD; renal transplant (2013); HTN; DM; and afib presenting with AMS.  He presented to [facility] last night for AMS.  His wife noticed Saturday that he wasn't talking much, was staring.  When asked a question, he would hesitate and stutter.  They were concerned about a ministroke.  He did not seem to have other symptoms.  She saw a Nath of this Friday but Saturday it was much worse.  No headaches or vision changes.  He has noticed expressive aphasia. Occasional tremor.  Intermittent urinary incontinence.   He is usually a talker and less able to do that.  No apparent changes in executive function.  The patient denies unintentional weight loss or night sweats.  He is eating and drinking ok.  He was recently taken off of his anticoagulation by his PCP in preparation for a prostate biopsy that was to have taken place this week at [facility]. The patient has had an elevated PSA. It was 9.34 in 10/2019, up from 6.79 in 05/2019.  He also has left hip and leg pain.  This has been going on for a couple of months and is about the same.  It hurts in his hip and radiates down his entire leg.  No back pain.   Neurosurgery has been consulted. The plan was to perform brain biopsy on 01/02/2020. However, repeat MRI performed prior to procedure demonstrated new infarct. The procedure has been postponed until the patient has completed work up for the CVA. Neurology has been consulted and stroke work up initiated.  Echocardiogram demonstrates EF of 55-60%, Nl LV function. No regional wall motion abnormalities. Mild LVH is present and LV diastolic function could not be evaluated. RV systolic function is normal. They were unable to assess PA pressure due to inadequate signal from tricuspid regurgitation. LA and RA were moderately dilated. LV  function has improved from previous. No intracardiac source of thrombus was seen.  Carotid ultrasound: BL ICA demonstrate 1-39% stenosis. Antegrade flow of bilateral vertebral arteries.   EKG demonstrates atrial fibrillation with a controlled ventricular response. Initiation of anticoagulation has been discussed with [name]. As the petechial hemorrhages seen on MRI occurred more than a week ago, [name] recommends starting Eliquis. Pharmacy has been consulted to manage the initiation of Eliquis.  On 01/04/2020 the patient had a 2 second pause on telemetry. Labetalol 100 mg bid has been stopped and the patient has been changed to metoprolol 12.5 mg bid. This change seems to have addressed this problem as well as the patient's blood pressure. Eliquis has been started. Will monitor on Eliquis for one day.  The patient has been discussed with [name]. He states that the apparent mass on MRI is much more likely a stroke. No further follow up with neurosurgery is necessary. The patient will follow up with stroke clinic on discharge. Consultants  . Neurosurgery . Neurology  Procedures  . None  Antibiotics   Anti-infectives (From admission, onward)   Start     Dose/Rate Route Frequency Ordered Stop   01/02/20 0000  ceFAZolin (ANCEF) IVPB 2g/100 mL premix  Status:  Discontinued     2 g 200 mL/hr over 30 Minutes Intravenous 30 min pre-op 01/01/20 2039 01/02/20 1958   01/02/20 0000  vancomycin (VANCOCIN) IVPB 1000 mg/200 mL premix  Status:  Discontinued     1,000 mg 200 mL/hr over 60 Minutes Intravenous 60 min pre-op 01/01/20 2039 01/02/20 1958   12/30/19 1100  sulfamethoxazole-trimethoprim (BACTRIM) 400-80 MG per tablet 1 tablet     1 tablet Oral Every M-W-F 12/30/19 1009       Subjective  The patient is resting comfortably. No new complaints.  Objective   Vitals:  Vitals:   01/05/20 0822 01/05/20 1209  BP: (!) 157/69 (!) 147/78  Pulse: 70 (!) 44  Resp: 16 18  Temp: 98 F (36.7  C) 97.7 F (36.5 C)  SpO2: 99% 100%   Exam:  Constitutional:  . The patient is awake, alert, and oriented x 3. No acute distress.  Respiratory:  . No increased work of breathing. . No wheezes, rales, or rhonchi . No tactile fremitus Cardiovascular:  . Regular rate and rhythm . No murmurs, ectopy, or gallups. . No lateral PMI. No thrills. Abdomen:  . Abdomen is soft, non-tender, non-distended . No hernias, masses, or organomegaly . Normoactive bowel sounds.  Musculoskeletal:  . No cyanosis, clubbing, or edema Skin:  . No rashes, lesions, ulcers . palpation of skin: no induration or nodules Neurologic:  . CN 2-12 intact . Sensation all 4 extremities intact . Motor function is intact. Psychiatric:  . Mental status o Mood, affect appropriate o Orientation to person, place, time  . judgment and insight appear intact  I have personally reviewed the following:   Today's Data  . Vitals, BMP, CBC, Lipid panel  Imaging  . MRI Brain 12/30/2019, 01/02/2020 . MRA Brain 01/03/2020 . Carotid ultrasound 01/03/2020 . Echocardiogram: pending . CT head without contrast: No evidence for frank hemorrhagic transformation.  Scheduled Meds: . apixaban  5 mg Oral BID  . atorvastatin  80 mg Oral q1800  . brimonidine  1 drop Right Eye BID  . cinacalcet  60 mg Oral Daily  . dexamethasone (DECADRON) injection  8 mg Intravenous Q6H  . docusate sodium  100 mg Oral BID  . dorzolamide-timolol  1 drop Right Eye BID  . famotidine  20 mg Oral Daily  . finasteride  5 mg Oral Daily  . folic acid  1 mg Oral Daily  . insulin aspart  0-15 Units Subcutaneous TID WC  . insulin aspart  0-5 Units Subcutaneous QHS  . insulin glargine  20 Units Subcutaneous Daily  . magnesium oxide  400 mg Oral Daily  . metoprolol tartrate  12.5 mg Oral BID  . mupirocin ointment  1 application Nasal BID  . mycophenolate  360 mg Oral BID  . NIFEdipine  60 mg Oral Daily  . sulfamethoxazole-trimethoprim  1 tablet Oral Q  M,W,F  . tacrolimus  2 mg Oral BID  . tamsulosin  0.4 mg Oral QPC breakfast   Continuous Infusions: . lactated ringers 10 mL/hr at 01/02/20 1912    Principal Problem:   Brain tumor [facility]) Active Problems:   AKI (acute kidney injury) ([place])   Kidney transplant recipient   Diabetes ([place])   Glaucoma   Hip pain, bilateral   Elevated PSA   Atrial fibrillation, chronic ([place])   Cerebral embolism with cerebral infarction   LOS: 6 days   A & P  Brain tumor: Patient with acute onset of confusion, expressive aphasia. 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 This is the source of his symptoms. Neurosurgery had planned to take the patient to the OR for a biopsy. However, as  the patient was found to have new CVA on repeat MRI prior to planned biopsy, the brain biopsy was cancelled lieu of stroke work up. Neurology was consulted. [name] has evaluated the patient and feels that Brain Biopsy is no longer necessary. I have discussed the patient with [name]. He believes that the abnormality seen on MRI was infact just a CVA. Stroke work up has been completed. The patient is found to be in atrial fibrillation. Initiation of anticoagulation has been discussed with [name]. As the petechial hemorrhages seen on MRI occurred more than a week ago, [name] recommended starting Eliquis. Pharmacy has been consulted to manage the initiation of Eliquis.  Aniscoria: Likely due to brain mass. Unlikely to be due to CVA per neurology. Pt is on steroids to address vasogenic edema. Plan is for brain biopsy after stroke work up is completed.   Elevated PSA: Patient had planned to have prostate biopsy on 01/01/2020 at [facility]. Family is deciding if the want the patient to go ahead and have the procedure done here, or wait and have it done at [facility] as originally planned. The patient is also experiencing urinary incontinence. He will be continued on  proscar and flomax. He is taking bactrim 3x a week for UTI prophylaxis.  AKI with h/o renal transplantation: No recent labs since almost a year ago, but currently with significantly worsened creatinine/GFR. This may be associated with decreased PO intake in the setting of recent confusion. Creatinine is increasing. Will check a renal ultrasound. Continue IV fluids. Also will avoid nephrotoxic medications and hypotension. He will be continued on Sensipar. Voltaren gel will be held. Will consult nephrology to monitor the status of his renal transplant. He will be continued on prograf and myfortic. Will monitor creatinine, electrolytes, and volume status.  DM II: Recent A1c 6.8, indicating good control. PO meds (Januvia, Gluocphage, Starlix, Actos) have been held. He will receive Lantus 14 units. His glucoses will be followed with FSBS and SSI. For the last 24 hours glucoses have run from 107 - 175.  HTN: Continue Nifedipine and Labetalol. Blood pressures under fair control.  Atrial fibrillation: Rate controlled with labetalol. Coumadin is on hold due to upcoming prostate biopsy/brain biopsy/petechial hemorrhages on MRI Brain. Monitor on telemetry.  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 He complains of pain along the left leg as well as gait instability that appears to be related to the L hip. For now, pain management as needed with eventual plan for orthopedics consult as outpatient and likely to need THR.  Glaucoma: Continue Alphagan, Cosopt  HLD: Continue Lipitor  Hyperkalemia: Improved with IV fluids. Monitor.  I have seen and examined this patient myself. I have spent 36 minutes in his evaluation and care.  DVT prophylaxis:  SCDs Code Status:  Full - confirmed with patient/family Family Communication: No family available. Disposition Plan: Patient is from home. Plan is to discharge to home when medically cleared. Will continue to  monitor patient on Eliquis for another 24 hours due to prior history of petechial hemorrhage. Anticipate discharge to home tomorrow.  Sharyah Bostwick, DO [facility] Direct contact: see [email or web address]  7PM-7AM contact night coverage as above 01/05/2020, 2:07 PM  LOS: 1 day

## 2020-01-05 NOTE — Progress Notes (Signed)
STROKE TEAM PROGRESS NOTE    INTERVAL HISTORY .  Patient is sitting up in bed.  Continues to have mild expressive aphasia does follow commands well.  Vital signs stable.  No neurological changes.    OBJECTIVE Vitals:   01/04/20 2352 01/05/20 0407 01/05/20 0822 01/05/20 1209  BP: 136/79 (!) 149/90 (!) 157/69 (!) 147/78  Pulse: 68 (!) 51 70 (!) 44  Resp: 16 16 16 18   Temp: 97.8 F (36.6 C) 98.2 F (36.8 C) 98 F (36.7 C) 97.7 F (36.5 C)  TempSrc: Oral Oral Oral Oral  SpO2: 100% 98% 99% 100%  Weight:      Height:        CBC:  Recent Labs  Lab 01/02/20 0430 01/04/20 0418  WBC 7.4 9.1  NEUTROABS 6.8 8.3*  HGB 12.7* 14.0  HCT 39.1 43.7  MCV 83.5 84.4  PLT 130* [id number]    Basic Metabolic Panel:  Recent Labs  Lab 01/02/20 0430 01/04/20 0418  NA 136 135  K 4.2 4.5  CL 108 107  CO2 18* 18*  GLUCOSE 160* 172*  BUN 33* 41*  CREATININE 1.14 1.22  CALCIUM 8.6* 9.0    Lipid Panel:     Component Value Date/Time   CHOL 224 (H) 01/04/2020 0418   TRIG 87 01/04/2020 0418   HDL 43 01/04/2020 0418   CHOLHDL 5.2 01/04/2020 0418   VLDL 17 01/04/2020 0418   LDLCALC 164 (H) 01/04/2020 0418   HgbA1c:  Lab Results  Component Value Date   HGBA1C 7.1 (H) 12/30/2019   Urine Drug Screen: No results found for: LABOPIA, COCAINSCRNUR, LABBENZ, AMPHETMU, THCU, LABBARB  Alcohol Level No results found for: [place]  CT HEAD WO CONTRAST 01/04/2020 IMPRESSION:  1. No significant interval change in size of evolving subacute left ACA territory infarct with similar associated petechial hemorrhage. No evidence for frank hemorrhagic transformation or intraparenchymal hematoma.  2. Stable size of additional evolving ischemic infarct involving the posterior left temporoparietal and occipital region. No evidence for hemorrhagic transformation or significant regional mass effect.  3. No other new acute intracranial abnormality.   MR ANGIO HEAD WO CONTRAST 01/03/2020 IMPRESSION:   Negative MRI head    MR BRAIN WO CONTRAST 01/02/2020   IMPRESSION:  1. Stable anterior left frontal lobe lesion from the recent 12/30/2019 MRI, which in addition to confluent T2/FLAIR hyperintensity demonstrates abundant susceptibility artifact suggesting blood products. 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 New confluent area of posterior left hemisphere gyriform restricted diffusion, with only minimal FLAIR hyperintensity at this time but also punctate new susceptibility. Furthermore, there are small areas of chronic encephalomalacia in the right hemisphere with hemosiderin.  3. Therefore, consider an alternative diagnosis for #1 of Subacute Infarct with petechial hemorrhage. Venous Infarcts could also have this appearance, although the superior sagittal sinus appears patent. Other stroke/tumor mimics such as MELAS were considered but felt less likely. CTA head and neck may be valuable considering the acute ischemia in #2.   MR MRV HEAD WO CM 01/03/2020 IMPRESSION:  Negative for venous sinus thrombosis. Hypoplastic left transverse sinus appears congenital. This had a similar appearance on the prior MRI brain with contrast on 12/30/2019    ECHOCARDIOGRAM COMPLETE 01/03/2020 IMPRESSIONS   1. Left ventricular ejection fraction, by estimation, is 55 to 60%. The left ventricle has normal function. The left ventricle has no regional wall motion abnormalities. There is mild concentric left ventricular hypertrophy. Left ventricular diastolic function could  not be evaluated.   2. Right ventricular systolic function is normal. The right ventricular size is normal. Tricuspid regurgitation signal is inadequate for assessing PA pressure.   3. Left atrial size was moderately dilated.   4. Right atrial size was mild to moderately dilated.   5. The mitral valve is normal in structure. No evidence of mitral valve regurgitation. No evidence of mitral stenosis.   6. The aortic  valve is tricuspid. Aortic valve regurgitation is not visualized. Mild aortic valve stenosis. Comparison(s): Prior images reviewed side by side. The left ventricular function has improved.   VAS US CAROTID 01/04/2020 Summary:  Right Carotid: Velocities in the right ICA are consistent with a 1-39% stenosis.  Left Carotid: Velocities in the left ICA are consistent with a 1-39% stenosis.  Vertebrals: Bilateral vertebral arteries demonstrate antegrade flow.   Final    ECG atrial fibrillation - ventricular response 77 BPM (See cardiology reading for complete details)    PHYSICAL EXAM Blood pressure (!) 147/78, pulse (!) 44, temperature 97.7 F (36.5 C), temperature source Oral, resp. rate 18, height 5\' 11"  (1.803 m), weight 86.6 kg, SpO2 100 %. Pleasant elderly African-American male not in distress. . Afebrile. Head is nontraumatic. Neck is supple without bruit.    Cardiac exam no murmur or gallop. Lungs are clear to auscultation. Distal pulses are well felt. Neurological Exam : He is awake alert has mild expressive aphasia and can speak   short sentences has some paraphasic errors as well as garbled speech at times.  Difficulty with naming, repetition.  Follows only simple midline and one-step commands.  Does not blink to threat on the right but does so on the left.  Right lower facial weakness.  Tongue midline.  Motor system exam able to move all 4 extremities well against gravity without focal weakness.  Sensation appears intact.  Gait not tested.     ASSESSMENT/PLAN Ryan Rice is a 69 y.o. 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 He did not receive IV t-PA due to late presentation (>4.5 hours from time of onset).  Possible TIA:  No acute stroke - evolving subacute left ACA territory infarct with similar  associated petechial hemorrhage  Resultant aphasia and right facial weakness code Stroke CT Head - not ordered  CT head - No significant interval change in size of evolving subacute left ACA territory infarct with similar associated petechial hemorrhage. No evidence for frank hemorrhagic transformation or intraparenchymal hematoma. Stable size of additional evolving ischemic infarct involving the posterior left temporoparietal and occipital region.  MRI head 01/03/20 - Negative MRI head     MRA head - negative  MR MRV HEAD WO CM - Negative for venous sinus thrombosis. Hypoplastic left transverse sinus appears congenital.  CTA H&N - not ordered  CT Perfusion - not ordered  Carotid Doppler - unremarkable  2D Echo - EF 55 to 60%. No cardiac source of emboli identified.   Sars Corona Virus 2 - negative  LDL - 164  HgbA1c - 7.1  UDS - not ordered  VTE prophylaxis - SCDs Diet  Diet Order            Diet heart healthy/carb modified Room service appropriate? Yes; Fluid consistency: Thin  Diet effective now  warfarin daily prior to admission, now on eliquis   daily  Patient counseled to be compliant with his antithrombotic medications  Ongoing aggressive stroke risk factor management  Therapy recommendations:  HH PT  Disposition:  Pending  Hypertension  Home BP meds: Labetalol ; Adalat  Current BP meds: nifedipine ; labetalol  Blood pressure somewhat high at times but within post stroke/TIA parameters . Permissive hypertension (OK if < 220/120) but gradually normalize in 5-7 days  . Long-term BP goal normotensive  Hyperlipidemia  Home Lipid lowering medication: Lipitor 10 mg daily  LDL 164, goal < 70  Current lipid lowering medication: Lipitor 10 mg daily -> increase to lipitor 80  Continue statin at discharge  Diabetes  Home diabetic meds: Januvia ; Starlix ; metformin ; Actos  Current diabetic meds: insulin  HgbA1c 7.1, goal < 7.0 Recent  Labs    01/05/20 0601 01/05/20 0826 01/05/20 1211  GLUCAP 192* 226* 284*    Other Stroke Risk Factors  Advanced age  Former cigarette smoker - quit  ETOH use, advised to drink no more than 1 alcoholic beverage per day.  Family hx stroke (mother)  Other Active Problems  Code status - Full code  Hx ESRD s/p renal transplant 2013  Hx R retinal detachment s/p repair 2 yrs ago which may explain irregular pupil shape  L>R hip pain  Glaucoma   AKI creatinine 1.37-1.34-1.14  NS consult for ? frontal mask - no longer be needed - NS signed off   Hospital day # 6 Patient remains aphasic and  Have resumed anticoagulation with Eliquis as his renal function seems satisfactory.  .  Continue ongoing therapies.  Discussed with Dr. Benny Lennert.  Stroke team will sign off.  Kindly call for questions. Antony Contras, MD To contact Stroke Continuity provider, please refer to [email or web address] After hours, contact General Neurology

## 2020-01-06 ENCOUNTER — Inpatient Hospital Stay (HOSPITAL_COMMUNITY): Payer: Medicare HMO

## 2020-01-06 DIAGNOSIS — D496 Neoplasm of unspecified behavior of brain: Secondary | ICD-10-CM | POA: Diagnosis not present

## 2020-01-06 LAB — CBC WITH DIFFERENTIAL/PLATELET
Abs Immature Granulocytes: 0.15 10*3/uL — ABNORMAL HIGH (ref 0.00–0.07)
Basophils Absolute: 0 10*3/uL (ref 0.0–0.1)
Basophils Relative: 0 %
Eosinophils Absolute: 0 10*3/uL (ref 0.0–0.5)
Eosinophils Relative: 0 %
HCT: 43.8 % (ref 39.0–52.0)
Hemoglobin: 14.3 g/dL (ref 13.0–17.0)
Immature Granulocytes: 2 %
Lymphocytes Relative: 4 %
Lymphs Abs: 0.4 10*3/uL — ABNORMAL LOW (ref 0.7–4.0)
MCH: 27.2 pg (ref 26.0–34.0)
MCHC: 32.6 g/dL (ref 30.0–36.0)
MCV: 83.3 fL (ref 80.0–100.0)
Monocytes Absolute: 0.4 10*3/uL (ref 0.1–1.0)
Monocytes Relative: 5 %
Neutro Abs: 8.7 10*3/uL — ABNORMAL HIGH (ref 1.7–7.7)
Neutrophils Relative %: 89 %
Platelets: 182 10*3/uL (ref 150–400)
RBC: 5.26 MIL/uL (ref 4.22–5.81)
RDW: 14.5 % (ref 11.5–15.5)
WBC: 9.7 10*3/uL (ref 4.0–10.5)
nRBC: 0 % (ref 0.0–0.2)

## 2020-01-06 LAB — TROPONIN I (HIGH SENSITIVITY)
Troponin I (High Sensitivity): 30 ng/L — ABNORMAL HIGH (ref <18)
Troponin I (High Sensitivity): 25 ng/L — ABNORMAL HIGH (ref <18)

## 2020-01-06 LAB — BASIC METABOLIC PANEL
Anion gap: 6 (ref 5–15)
BUN: 54 mg/dL — ABNORMAL HIGH (ref 8–23)
CO2: 21 mmol/L — ABNORMAL LOW (ref 22–32)
Calcium: 8.7 mg/dL — ABNORMAL LOW (ref 8.9–10.3)
Chloride: 104 mmol/L (ref 98–111)
Creatinine, Ser: 1.56 mg/dL — ABNORMAL HIGH (ref 0.61–1.24)
GFR calc Af Amer: 52 mL/min — ABNORMAL LOW (ref >60)
GFR calc non Af Amer: 45 mL/min — ABNORMAL LOW (ref >60)
Glucose, Bld: 182 mg/dL — ABNORMAL HIGH (ref 70–99)
Potassium: 5.1 mmol/L (ref 3.5–5.1)
Sodium: 131 mmol/L — ABNORMAL LOW (ref 135–145)

## 2020-01-06 LAB — GLUCOSE, CAPILLARY
Glucose-Capillary: 185 mg/dL — ABNORMAL HIGH (ref 70–99)
Glucose-Capillary: 227 mg/dL — ABNORMAL HIGH (ref 70–99)
Glucose-Capillary: 263 mg/dL — ABNORMAL HIGH (ref 70–99)
Glucose-Capillary: 155 mg/dL — ABNORMAL HIGH (ref 70–99)

## 2020-01-06 LAB — CREATININE, URINE, RANDOM: Creatinine, Urine: 44.46 mg/dL

## 2020-01-06 LAB — SODIUM, URINE, RANDOM: Sodium, Ur: 24 mmol/L

## 2020-01-06 MED ORDER — HYDRALAZINE HCL 25 MG PO TABS: 25.0000 mg | ORAL_TABLET | Freq: Four times a day (QID) | ORAL | 0 refills | Status: DC

## 2020-01-06 MED ORDER — SODIUM CHLORIDE 0.9 % IV SOLN: INTRAVENOUS | Status: DC

## 2020-01-06 MED ORDER — PREDNISONE 10 MG PO TABS: ORAL_TABLET | ORAL | 0 refills | Status: DC

## 2020-01-06 MED ORDER — HYDRALAZINE HCL 25 MG PO TABS
25.0000 mg | ORAL_TABLET | Freq: Four times a day (QID) | ORAL | Status: DC
  Administered 2020-01-06 – 2020-01-10 (×15): 25 mg via ORAL
  Filled 2020-01-06 (×16): qty 1

## 2020-01-06 MED ORDER — ATORVASTATIN CALCIUM 80 MG PO TABS: 80.0000 mg | ORAL_TABLET | Freq: Every day | ORAL | 0 refills | Status: DC

## 2020-01-06 MED ORDER — APIXABAN 5 MG PO TABS: 5.0000 mg | ORAL_TABLET | Freq: Two times a day (BID) | ORAL | 0 refills | Status: DC

## 2020-01-06 MED ORDER — PREDNISONE 10 MG PO TABS: 10.0000 mg | ORAL_TABLET | Freq: Every day | ORAL | 0 refills | Status: DC

## 2020-01-06 MED ORDER — DEXAMETHASONE 4 MG PO TABS: 4.0000 mg | ORAL_TABLET | Freq: Three times a day (TID) | ORAL | 0 refills | Status: AC

## 2020-01-06 MED ORDER — DOCUSATE SODIUM 100 MG PO CAPS: 100.0000 mg | ORAL_CAPSULE | Freq: Two times a day (BID) | ORAL | 0 refills | Status: DC

## 2020-01-06 MED ORDER — METOPROLOL TARTRATE 25 MG PO TABS: 12.5000 mg | ORAL_TABLET | Freq: Two times a day (BID) | ORAL | 0 refills | Status: DC

## 2020-01-06 NOTE — Consult Note (Addendum)
Reason for Consult: Acute kidney injury on chronic kidney disease stage IIT Referring Physician: Karie Kirks, DO [facility])  HPI:  [name] with past medical history significant for diabetes mellitus, hypertension, atrial fibrillation, peripheral vascular disease and end-stage renal disease status post cadaveric renal transplant in March, 2013 (also reviewed records indicating March, 2014---patient himself states "2019")  at [facility] with baseline creatinine seemingly around 1.5-1.7 based on review of his records following early rejection treated with IVIG/rituximab.  He is on maintenance therapy with Prograf and Myfortic and follows up routinely with Dr. Dimas Aguas in [facility].  He was admitted to the hospital 1 week ago with changes in mental status when his wife noted distinct change in his speech quality/frequency and brought him to the emergency room for additional evaluation.  He also had some left lower extremity pain without associated back pain and was holding Coumadin for a prostate biopsy.  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  He has undergone work-up and risk factor identification/modification strategies for the same.  Concern is raised with changes in his renal function.  His creatinine trend since admission has been as follows:   12/29/2019  12/30/2019  01/02/2020  01/04/2020  01/06/2020   BUN 31 (H) 28 (H) 33 (H) 41 (H) 54 (H)  Creatinine 1.75 (H) 1.37 (H) 1.14 1.22 1.56 (H)   His input/output indicate a net negative fluid balance of 4.2 L since admission and his oral intake appears to have been inconsistent.  He has not had any iodinated intravenous contrast exposure, nonsteroidal anti-inflammatory drugs or  significant episode of hypotension.  He denies any chest pain or shortness of breath and does not have any nausea, vomiting or diarrhea.  He denies any dysuria, urgency, frequency or hematuria.  Past Medical History:  Diagnosis Date  . Allergic rhinitis   . BPH (benign prostatic hyperplasia)   . Chronic atrial fibrillation ([place])   . Diabetes mellitus ([place])   . DVT (deep venous thrombosis) ([facility])    LLE  . Hematochezia   . HTN (hypertension)   . Hypomagnesemia 03/20/2019  . Kidney transplant recipient   . PVD (peripheral vascular disease) ([place])     Past Surgical History:  Procedure Laterality Date  . KIDNEY TRANSPLANT      Family History  Problem Relation Age of Onset  . Stroke Mother   . Heart disease Father   . Diabetes Father   . Diabetes Sister   . Diabetes Brother   . Heart disease Brother     Social History:  reports that he quit smoking about 35 years ago. He quit smokeless tobacco use about 6 years ago. He reports current alcohol use. He reports that he does not use drugs.  Allergies:  Allergies  Allergen Reactions  . Iodinated Diagnostic Agents Hives  . Tape Hives    Medications:  Scheduled: . apixaban  5 mg Oral BID  . atorvastatin  80 mg Oral q1800  . brimonidine  1 drop Right Eye BID  . cinacalcet  60 mg Oral Daily  . dexamethasone (DECADRON) injection  8 mg Intravenous Q6H  . docusate sodium  100 mg Oral BID  . dorzolamide-timolol  1 drop Right Eye BID  . famotidine  20 mg Oral Daily  . finasteride  5 mg Oral  Daily  . folic acid  1 mg Oral Daily  . hydrALAZINE  25 mg Oral Q6H  . insulin aspart  0-15 Units Subcutaneous TID WC  . insulin aspart  0-5 Units Subcutaneous QHS  . insulin glargine  20 Units Subcutaneous Daily  . magnesium oxide  400 mg Oral Daily  . metoprolol tartrate  12.5 mg Oral BID  . mycophenolate  360 mg Oral BID  . NIFEdipine  60 mg Oral Daily  . sulfamethoxazole-trimethoprim  1 tablet Oral Q M,W,F  . tacrolimus  2 mg Oral BID   . tamsulosin  0.4 mg Oral QPC breakfast    BMP Latest Ref Rng & Units 01/06/2020 01/04/2020 01/02/2020  Glucose 70 - 99 mg/dL 182(H) 172(H) 160(H)  BUN 8 - 23 mg/dL 54(H) 41(H) 33(H)  Creatinine 0.61 - 1.24 mg/dL 1.56(H) 1.22 1.14  Sodium 135 - 145 mmol/L 131(L) 135 136  Potassium 3.5 - 5.1 mmol/L 5.1 4.5 4.2  Chloride 98 - 111 mmol/L 104 107 108  CO2 22 - 32 mmol/L 21(L) 18(L) 18(L)  Calcium 8.9 - 10.3 mg/dL 8.7(L) 9.0 8.6(L)   CBC Latest Ref Rng & Units 01/06/2020 01/04/2020 01/02/2020  WBC 4.0 - 10.5 K/uL 9.7 9.1 7.4  Hemoglobin 13.0 - 17.0 g/dL 14.3 14.0 12.7(L)  Hematocrit 39.0 - 52.0 % 43.8 43.7 39.1  Platelets 150 - 400 K/uL 182 161 130(L)     CT HEAD WO CONTRAST  Result Date: 01/06/2020 CLINICAL DATA:  Stroke, follow-up. EXAM: CT HEAD WITHOUT CONTRAST TECHNIQUE: Contiguous axial images were obtained from the base of the skull through the vertex without intravenous contrast. COMPARISON:  Prior head CT examinations 01/04/2020 and earlier, brain MRI 01/02/2020, brain MRI 12/30/2019. FINDINGS: Brain: No significant change in size of a paramedian anterior left frontal lobe lesion. As before, there is associated petechial hemorrhage without frank hemorrhagic conversion. Also unchanged, there is partial effacement of the anterior horn of the left lateral ventricle. No midline shift. An evolving subacute ischemic infarct involving portions of the left temporoparietal and occipital lobes has not significantly changed in size. Question minimal associated petechial hemorrhage. No frank hemorrhagic conversion. No new demarcated cortical infarct is identified. Redemonstrated small chronic cortically based infarcts within the right parietal and occipital lobes. Stable background chronic small vessel ischemic disease and generalized parenchymal atrophy again. No hydrocephalus. No extra-axial fluid collection. Vascular: No hyperdense vessel. Skull: Normal. Negative for fracture or focal lesion.  Sinuses/Orbits: Visualized orbits demonstrate no acute abnormality. Minimal ethmoid sinus mucosal thickening. No significant mastoid effusion. IMPRESSION: 1. Unchanged size and appearance of a lesion involving the paramedian anterior left frontal lobe. Please refer to prior brain MRI 01/02/2020 for differential considerations. Unchanged petechial hemorrhage associated with this lesion. 2. An evolving subacute ischemic infarct involving portions of the left temporoparietal and occipital lobes has not significantly changed in size. There may be minimal associated petechial hemorrhage without frank hemorrhagic conversion. 3. No worsening mass effect or midline shift. 4. Redemonstrated chronic cortically based infarcts within the right parietal and occipital lobes. 5. Stable background generalized parenchymal atrophy and chronic small vessel ischemic disease. Electronically Signed   By: Kellie Simmering DO   On: 01/06/2020 13:21    Review of Systems  Constitutional: Negative.  Negative for chills, fatigue and fever.  HENT: Negative for congestion, dental problem, ear pain, nosebleeds, sinus pressure, sinus pain and sore throat.   Eyes: Negative for pain, redness and visual disturbance.  Respiratory: Negative for cough, chest tightness, shortness of breath and wheezing.  Cardiovascular: Negative.  Negative for chest pain and leg swelling.  Gastrointestinal: Negative for abdominal pain, blood in stool, diarrhea, nausea and vomiting.  Genitourinary: Negative for difficulty urinating, flank pain, hematuria and urgency.  Musculoskeletal: Positive for arthralgias. Negative for back pain and joint swelling.       Left knee/hip  Skin: Negative for rash.  Neurological: Positive for speech difficulty.   Blood pressure (!) 145/68, pulse (!) 43, temperature 97.8 F (36.6 C), temperature source Oral, resp. rate 16, height 5\' 11"  (1.803 m), weight 86.6 kg, SpO2 99 %. Physical Exam  Nursing note and vitals  reviewed. Constitutional: He appears well-developed and well-nourished. No distress.  HENT:  Head: Normocephalic and atraumatic.  Mouth/Throat: Oropharynx is clear and moist.  Eyes: Conjunctivae and EOM are normal. No scleral icterus.  Anisocoria noted  Neck: No JVD present.  Cardiovascular: Normal rate, regular rhythm and normal heart sounds.  No murmur heard. Respiratory: Effort normal and breath sounds normal. He has no wheezes. He has no rales.  GI: Soft. Bowel sounds are normal. He exhibits no distension. There is no abdominal tenderness. There is no guarding.  Musculoskeletal:        General: No edema.     Cervical back: Normal range of motion and neck supple.     Comments: Left upper extremity fistula  Neurological: He is alert.  Not oriented to place/time.  Vaguely oriented to person.  Evidence of expressive aphasia noted.  Skin: Skin is warm and dry. No rash noted.    Assessment/Plan: 1.  Acute kidney injury on chronic kidney disease stage II T: He appears to have a widely variable baseline creatinine based on previous records however, based on his current hospitalization records, indeed may have an element of volume contraction.  I will check urine electrolytes and begin him on intravenous fluids.  Continue steroid sparing immunosuppressive therapy at this time.  I will check a Prograf level tomorrow morning with labs however, the turnaround time for this lab is notoriously slow and may not be useful for current management.  I will order a renal ultrasound to ensure that he does not have any obstruction. 2.  Hyperkalemia: Mild and likely associated with acute kidney injury.  Monitor with forced diuresis on normal saline drip. 3.  Altered mental status/expressive aphasia: With imaging showing an evolving subacute left ACA territory infarct with associated petechial hemorrhage.  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 4.  Hypertension: Blood pressure  under acceptable control on nifedipine and labetalol, will continue to monitor for rise after beginning normal saline. 5.  Atrial fibrillation: Rate controlled on labetalol and plans noted to begin Eliquis which may need to be coordinated closely with prostate biopsy timing. 6.  Diabetes mellitus: Closely managed by Dr. Elyse Hsu as an outpatient.  Current inpatient management per primary service.  Adalei Novell K. 01/06/2020, 4:11 PM

## 2020-01-06 NOTE — Progress Notes (Addendum)
Occupational Therapy Treatment Patient Details Name: Ryan Rice MRN: [id number] DOB: 1951-06-24 Today's Date: 01/06/2020    History of present illness 69 y.o. male with medical history significant of PVD; renal transplant (2013); HTN; DM; and afib presenting with AMS. Pt reporting some expressive aphasia, tremors, urinary incontinence. Pt also reports L hip and leg pain for a few months. 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 Treatment in place for infarct vs neoplasm    OT comments  Patient continues to make progress towards goals in skilled OT session. Patient's session encompassed cognitive tests via trial mapping and locating items in L visual field. Pt noted to be more impulsive and disoriented in session, requiring max verbal cues and min tactile cues to follow one step commands. Pt unable to locate items in L visual field without prompting, and unable to complete 3 step trail mapping task without step by step cues and despite cues demonstrated decreased success. Due to current deficits, therapist is recommending 24 supervision upon discharge to emphasize safety in functional tasks; will continue to follow acutely.    Follow Up Recommendations  Home health OT;Supervision/Assistance - 24 hour    Equipment Recommendations  None recommended by OT    Recommendations for Other Services      Precautions / Restrictions Precautions Precautions: Fall Restrictions Weight Bearing Restrictions: No       Mobility Bed Mobility Overal bed mobility: Modified Independent             General bed mobility comments: requires supervision for impulsivity  Transfers Overall transfer level: Needs assistance Equipment used: Rolling walker (2 wheeled) Transfers: Sit to/from Stand Sit to Stand: Min guard          General transfer comment: min guard for safety    Balance Overall balance assessment: Needs assistance Sitting-balance support: No upper extremity supported;Feet supported Sitting balance-Leahy Scale: Good     Standing balance support: Single extremity supported;No upper extremity supported;During functional activity Standing balance-Leahy Scale: Fair Standing balance comment: Use of AD for safety                           ADL either performed or assessed with clinical judgement   ADL Overall ADL's : Needs assistance/impaired                         Toilet Transfer: Min guard;Cueing for safety;Cueing for sequencing Toilet Transfer Details (indicate cue type and reason): Simulated with transfer to recliner after functional ambulation         Functional mobility during ADLs: Min guard;Minimal assistance General ADL Comments: Pt continues to demonstrate cognitive deficits when completing trail mapping tasks, pt refused ADLs this date     Vision Baseline Vision/History: Wears glasses Wears Glasses: At all times     Perception     Praxis      Cognition Arousal/Alertness: Awake/alert Behavior During Therapy: Impulsive;Flat affect Overall Cognitive Status: Difficult to assess(secondary to langauge deficits ) Area of Impairment: Attention;Memory;Following commands;Safety/judgement;Awareness;Problem solving                 Orientation Level: Disoriented to;Time;Place Current Attention Level: Sustained Memory: Decreased short-term memory;Decreased recall of precautions Following Commands: Follows multi-step commands inconsistently;Follows one step commands inconsistently Safety/Judgement: Decreased awareness of safety;Decreased awareness of  deficits Awareness: Emergent Problem Solving: Slow processing;Requires tactile cues;Difficulty sequencing;Requires verbal cues General Comments: Attempted 3 step trail mapping task with patient, unable  to complete without max verbal cues requiring tactile cues to stop ambulating down the hall at times. Pt unable to orient to place, first calling it a hotel, then with a choice of two, stated that he was in a bank.        Exercises     Shoulder Instructions       General Comments      Pertinent Vitals/ Pain       Pain Assessment: No/denies pain  Home Living     Available Help at Discharge: Available 24 hours/day;Family Type of Home: House                              Lives With: Spouse;Family(wife and granddaughter )    Prior Functioning/Environment              Frequency  Min 2X/week        Progress Toward Goals  OT Goals(current goals can now be found in the care plan section)  Progress towards OT goals: Not progressing toward goals - comment(Pt with increased deficits in cognition, unable to complete trial mapping or find objects in L visual field without cues, increased disorientation to date)  Acute Rehab OT Goals Patient Stated Goal: To go home OT Goal Formulation: With patient Time For Goal Achievement: 01/14/20 Potential to Achieve Goals: Good  Plan Discharge plan needs to be updated    Co-evaluation                 AM-PAC OT "6 Clicks" Daily Activity     Outcome Measure   Help from another person eating meals?: A [place] Help from another person taking care of personal grooming?: A [place] Help from another person toileting, which includes using toliet, bedpan, or urinal?: A [place] Help from another person bathing (including washing, rinsing, drying)?: A [place] Help from another person to put on and taking off regular upper body clothing?: A [place] Help from another person to put on and taking off regular lower body clothing?: A [place] 6 Click Score: 18    End of Session Equipment Utilized During Treatment: Rolling walker  OT Visit Diagnosis: Unsteadiness on feet (R26.81);Muscle weakness (generalized) (M62.81)   Activity  Tolerance Patient tolerated treatment well   Patient Left with call bell/phone within reach;in chair;with chair alarm set   Nurse Communication Mobility status;Precautions        Time: 1050-1101 OT Time Calculation (min): 11 min  Charges: OT General Charges $OT Visit: 1 Visit OT Treatments $Self Care/Home Management : 8-22 mins  Corinne Ports E. Minie Roadcap, COTA/L Acute Rehabilitation Services [place] 01/06/2020, 2:12 PM

## 2020-01-06 NOTE — TOC Transition Note (Signed)
Transition of Care [facility]) - CM/SW Discharge Note   Patient Details  Name: Ryan Rice MRN: [id number] Date of Birth: 1951/08/17  [facility] CM/SW Contact:  Pollie Friar, RN Phone Number: 01/06/2020, 11:38 AM   Clinical Narrative:    Pt discharging home with [facility] services through [place]. Cory with [facility] aware of d/c home today.  No DME needs.  Pt provided 30 day free card for Eliquis and bedside RN to make sure wife receives.  Pt has transportation home.    Final next level of care: Home w Home Health Services Barriers to Discharge: No Barriers Identified   Patient Goals and CMS Choice   CMS Medicare.gov Compare Post Acute Care list provided to:: Patient Choice offered to / list presented to : Patient  Discharge Placement                       Discharge Plan and Services   Discharge Planning Services: CM Consult Post Acute Care Choice: Home Health                    HH Arranged: PT, OT [facility] Agency: [place] Date [place]: 12/31/19   Representative spoke with at [place]: Tommi Rumps aware of d/c home today.  Social Determinants of Health (SDOH) Interventions     Readmission Risk Interventions Readmission Risk Prevention Plan 12/31/2019  Transportation Screening Complete  HRI or Home Care Consult Complete  Palliative Care Screening Not Applicable  Medication Review (RN Care Manager) Referral to Pharmacy

## 2020-01-06 NOTE — Progress Notes (Deleted)
PROGRESS NOTE  Ryan Rice [id number] DOB: [birth year] DOA: 12/29/2019 PCP: Drosinis, Pamalee Leyden, PA-C  Brief History    Ryan Rice is a 69 y.o. male with medical history significant of PVD; renal transplant (2013); HTN; DM; and afib presenting with AMS.  He presented to [facility] last night for AMS.  His wife noticed Saturday that he wasn't talking much, was staring.  When asked a question, he would hesitate and stutter.  They were concerned about a ministroke.  He did not seem to have other symptoms.  She saw a [place] of this Friday but Saturday it was much worse.  No headaches or vision changes.  He has noticed expressive aphasia. Occasional tremor.  Intermittent urinary incontinence.   He is usually a talker and less able to do that.  No apparent changes in executive function.  The patient denies unintentional weight loss or night sweats.  He is eating and drinking ok.  He was recently taken off of his anticoagulation by his PCP in preparation for a prostate biopsy that was to have taken place this week at [facility]. The patient has had an elevated PSA. It was 9.34 in 10/2019, up from 6.79 in 05/2019.  He also has left hip and leg pain.  This has been going on for a couple of months and is about the same.  It hurts in his hip and radiates down his entire leg.  No back pain.   Neurosurgery has been consulted. The plan was to perform brain biopsy on 01/02/2020. However, repeat MRI performed prior to procedure demonstrated new infarct. The procedure has been postponed until the patient has completed work up for the CVA. Neurology has been consulted and stroke work up initiated.  Echocardiogram demonstrates EF of 55-60%, Nl LV function. No regional wall motion abnormalities. Mild LVH is present and LV diastolic function could not be evaluated. RV systolic function is normal. They were unable to assess PA pressure due to inadequate signal from tricuspid regurgitation. LA and RA were moderately dilated. LV  function has improved from previous. No intracardiac source of thrombus was seen.  Carotid ultrasound: BL ICA demonstrate 1-39% stenosis. Antegrade flow of bilateral vertebral arteries.   EKG demonstrates atrial fibrillation with a controlled ventricular response. Initiation of anticoagulation has been discussed with [name]. As the petechial hemorrhages seen on MRI occurred more than a week ago, [name] recommends starting Eliquis. Pharmacy has been consulted to manage the initiation of Eliquis.  On 01/04/2020 the patient had a 2 second pause on telemetry. Labetalol 100 mg bid has been stopped and the patient has been changed to metoprolol 12.5 mg bid. This change seems to have addressed this problem as well as the patient's blood pressure. Eliquis has been started. Will monitor on Eliquis for one day.  The patient has been discussed with [name]. He states that the apparent mass on MRI is much more likely a stroke. No further follow up with neurosurgery is necessary. The patient will follow up with stroke clinic on discharge.  Discharge to home was anticipated this morning after I visited this patient in his room. However, I received a message from nursing that the patient was now having difficulty answering questions that he had no difficulty with this morning. Repeat CT head was performed and demonstrated no significant change. The patient also had some changes on telemetry. EKG showed these to most likely be just another manifestation of early repolarization abnormality. SLP has re-evaluated the patient. They now recommend  inpatient rehab for the patient. Discharge has been cancelled, and PT/OT and inpatient rehab have been asked to re-evaluated the patient for discharge disposition. Consultants  . Neurosurgery . Neurology  Procedures  . None  Antibiotics   Anti-infectives (From admission, onward)   Start     Dose/Rate Route Frequency Ordered Stop   01/02/20 0000  ceFAZolin (ANCEF)  IVPB 2g/100 mL premix  Status:  Discontinued     2 g 200 mL/hr over 30 Minutes Intravenous 30 min pre-op 01/01/20 2039 01/02/20 1958   01/02/20 0000  vancomycin (VANCOCIN) IVPB 1000 mg/200 mL premix  Status:  Discontinued     1,000 mg 200 mL/hr over 60 Minutes Intravenous 60 min pre-op 01/01/20 2039 01/02/20 1958   12/30/19 1100  sulfamethoxazole-trimethoprim (BACTRIM) 400-80 MG per tablet 1 tablet     1 tablet Oral Every M-W-F 12/30/19 1009       Subjective  The patient is resting comfortably. No new complaints.  Objective   Vitals:  Vitals:   01/06/20 0751 01/06/20 1131  BP: (!) 155/74 (!) 154/63  Pulse: (!) 45 (!) 58  Resp: 16 18  Temp: 98.2 F (36.8 C) 98.2 F (36.8 C)  SpO2: 99% 98%   Exam:  Constitutional:  . The patient is awake, alert, and oriented x 3. No acute distress.   Respiratory:  . No increased work of breathing. . No wheezes, rales, or rhonchi . No tactile fremitus Cardiovascular:  . Regular rate and rhythm . No murmurs, ectopy, or gallups. . No lateral PMI. No thrills. Abdomen:  . Abdomen is soft, non-tender, non-distended . No hernias, masses, or organomegaly . Normoactive bowel sounds.  Musculoskeletal:  . No cyanosis, clubbing, or edema Skin:  . No rashes, lesions, ulcers . palpation of skin: no induration or nodules Neurologic:  . CN 2-12 intact . Sensation all 4 extremities intact . Motor function is intact. Psychiatric:  . Mental status o Mood, affect appropriate o Orientation to person, place, time  . judgment and insight appear intact  I have personally reviewed the following:   Today's Data  . Vitals, BMP, CBC  Imaging  . MRI Brain 12/30/2019, 01/02/2020 . MRA Brain 01/03/2020 . Carotid ultrasound 01/03/2020 . Echocardiogram: pending . CT head without contrast: No evidence for frank hemorrhagic transformation.  Scheduled Meds: . apixaban  5 mg Oral BID  . atorvastatin  80 mg Oral q1800  . brimonidine  1 drop Right Eye BID  .  cinacalcet  60 mg Oral Daily  . dexamethasone (DECADRON) injection  8 mg Intravenous Q6H  . docusate sodium  100 mg Oral BID  . dorzolamide-timolol  1 drop Right Eye BID  . famotidine  20 mg Oral Daily  . finasteride  5 mg Oral Daily  . folic acid  1 mg Oral Daily  . hydrALAZINE  25 mg Oral Q6H  . insulin aspart  0-15 Units Subcutaneous TID WC  . insulin aspart  0-5 Units Subcutaneous QHS  . insulin glargine  20 Units Subcutaneous Daily  . magnesium oxide  400 mg Oral Daily  . metoprolol tartrate  12.5 mg Oral BID  . mycophenolate  360 mg Oral BID  . NIFEdipine  60 mg Oral Daily  . sulfamethoxazole-trimethoprim  1 tablet Oral Q M,W,F  . tacrolimus  2 mg Oral BID  . tamsulosin  0.4 mg Oral QPC breakfast   Continuous Infusions: . lactated ringers Stopped (01/05/20 1800)    Principal Problem:   Brain tumor (Ryan Rice) Active Problems:  AKI (acute kidney injury) (Ryan Rice)   Kidney transplant recipient   Diabetes (Ryan Rice)   Glaucoma   Hip pain, bilateral   Elevated PSA   Atrial fibrillation, chronic (Ryan Rice)   Cerebral embolism with cerebral infarction   LOS: 7 days   A & P  Brain tumor: Patient with acute onset of confusion, expressive aphasia. 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 This is the source of his symptoms. Neurosurgery had planned to take the patient to the OR for a biopsy. However, as the patient was found to have new CVA on repeat MRI prior to planned biopsy, the brain biopsy was cancelled lieu of stroke work up. Neurology was consulted. [name] has evaluated the patient and feels that Brain Biopsy is no longer necessary. I have discussed the patient with [name]. He believes that the abnormality seen on MRI was infact a CVA. Stroke work up has been completed. The patient is found to be in atrial fibrillation. Initiation of anticoagulation has been discussed with [name]. As the petechial hemorrhages seen on MRI  occurred more than a week ago, [name] recommended starting Eliquis. Pharmacy has been consulted to manage the initiation of Eliquis. Steroids are being tapered down to the patient's usual dose of prednisone 10 mg daily.  Aniscoria: Likely due to brain mass. Unlikely to be due to CVA per neurology. Pt is on steroids to address vasogenic edema. Plan is for brain biopsy after stroke work up is completed.   Elevated PSA: Patient had planned to have prostate biopsy on 01/01/2020 at [facility]. Family is deciding if the want the patient to go ahead and have the procedure done here, or wait and have it done at [facility] as originally planned. The patient is also experiencing urinary incontinence. He will be continued on proscar and flomax. He is taking bactrim 3x a week for UTI prophylaxis.  AKI with h/o renal transplantation: Worsening. No recent labs since almost a year ago, but currently with significantly worsened creatinine/GFR. This may be associated with decreased PO intake in the setting of recent confusion. Creatinine is increasing. Will check a renal ultrasound. Continue IV fluids. Also will avoid nephrotoxic medications and hypotension. He will be continued on Sensipar. Voltaren gel will be held. He will be continued on prograf and myfortic. As creatinine is increasing, will consult nephrology. I appreciate their help  DM II: Recent A1c 6.8, indicating good control. PO meds (Januvia, Gluocphage, Starlix, Actos) have been held. He will receive Lantus 14 units. His glucoses will be followed with FSBS and SSI. For the last 24 hours glucoses have run from 163 - 236.  HTN: Continue Nifedipine and Labetalol. Blood pressures under fair control.  Atrial fibrillation: Rate controlled with labetalol. Coumadin is on hold due to upcoming prostate biopsy/brain biopsy/petechial hemorrhages on MRI Brain. Monitor on telemetry.  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 He complains of pain along the left leg as well as gait instability that appears to be related to the L hip. For now, pain management as needed with eventual plan for orthopedics consult as outpatient and likely to need THR.  Glaucoma: Continue Alphagan, Cosopt  HLD: Continue Lipitor  Hyperkalemia: Improved with IV fluids. Monitor.  I have seen and examined this patient myself. I have spent 30 minutes in his evaluation and care.  DVT prophylaxis:  SCDs Code Status:  Full - confirmed with patient/family Family Communication: No family available. Disposition Plan: Patient is from home. Plan is to discharge to home when medically cleared. Will continue to monitor patient on Eliquis for another 24 hours due to prior history of petechial hemorrhage. Anticipate discharge to home today.  Ryan Eliot, DO [facility] Direct contact: see [email or web address]  7PM-7AM contact night coverage as above 01/07/2020, 3:25 PM  LOS: 1 day

## 2020-01-06 NOTE — Progress Notes (Signed)
Inpatient Diabetes Program Recommendations  AACE/ADA: New Consensus Statement on Inpatient Glycemic Control (2015)  Target Ranges:  Prepandial:   less than 140 mg/dL      Peak postprandial:   less than 180 mg/dL (1-2 hours)      Critically ill patients:  140 - 180 mg/dL   Lab Results  Component Value Date   GLUCAP 227 (H) 01/06/2020   HGBA1C 7.1 (H) 12/30/2019    Review of Glycemic Control  Results for Ryan Rice, Ryan Rice (MRN [id number]) as of 01/06/2020 13:15  Ref. Range 01/05/2020 12:11 01/05/2020 16:16 01/05/2020 21:14 01/06/2020 06:11 01/06/2020 11:26  Glucose-Capillary Latest Ref Range: 70 - 99 mg/dL 284 (H) 267 (H) 225 (H) 185 (H) 227 (H)     Outpatient Diabetes medications: DM2  Current orders for Inpatient glycemic control: Novolog 0-15 units TID, 0-5 units QHS, Lantus 20 units QD, Decadron 8mg  Q6H   Inpatient Diabetes Program Recommendations: -Novolog 3 units TID with meals if eats at least 50% of meal  Thank you, Reche Dixon, RN, BSN Diabetes Coordinator Inpatient Diabetes Program [phone] (team pager from 8a-5p)

## 2020-01-06 NOTE — Plan of Care (Signed)

## 2020-01-06 NOTE — Progress Notes (Addendum)
PROGRESS NOTE  Ryan Rice [id number] DOB: [birth year] DOA: 12/29/2019 PCP: Drosinis, Pamalee Leyden, PA-C  Brief History    Ryan Rice is a 69 y.o. male with medical history significant of PVD; renal transplant (2013); HTN; DM; and afib presenting with AMS.  He presented to [facility] last night for AMS.  His wife noticed Saturday that he wasn't talking much, was staring.  When asked a question, he would hesitate and stutter.  They were concerned about a ministroke.  He did not seem to have other symptoms.  She saw a Keetch of this Friday but Saturday it was much worse.  No headaches or vision changes.  He has noticed expressive aphasia. Occasional tremor.  Intermittent urinary incontinence.   He is usually a talker and less able to do that.  No apparent changes in executive function.  The patient denies unintentional weight loss or night sweats.  He is eating and drinking ok.  He was recently taken off of his anticoagulation by his PCP in preparation for a prostate biopsy that was to have taken place this week at [facility]. The patient has had an elevated PSA. It was 9.34 in 10/2019, up from 6.79 in 05/2019.  He also has left hip and leg pain.  This has been going on for a couple of months and is about the same.  It hurts in his hip and radiates down his entire leg.  No back pain.   Neurosurgery has been consulted. The plan was to perform brain biopsy on 01/02/2020. However, repeat MRI performed prior to procedure demonstrated new infarct. The procedure has been postponed until the patient has completed work up for the CVA. Neurology has been consulted and stroke work up initiated.  Echocardiogram demonstrates EF of 55-60%, Nl LV function. No regional wall motion abnormalities. Mild LVH is present and LV diastolic function could not be evaluated. RV systolic function is normal. They were unable to assess PA pressure due to inadequate signal from tricuspid regurgitation. LA and RA were moderately dilated. LV  function has improved from previous. No intracardiac source of thrombus was seen.  Carotid ultrasound: BL ICA demonstrate 1-39% stenosis. Antegrade flow of bilateral vertebral arteries.   EKG demonstrates atrial fibrillation with a controlled ventricular response. Initiation of anticoagulation has been discussed with [name]. As the petechial hemorrhages seen on MRI occurred more than a week ago, [name] recommends starting Eliquis. Pharmacy has been consulted to manage the initiation of Eliquis.  On 01/04/2020 the patient had a 2 second pause on telemetry. Labetalol 100 mg bid has been stopped and the patient has been changed to metoprolol 12.5 mg bid. This change seems to have addressed this problem as well as the patient's blood pressure. Eliquis has been started. Will monitor on Eliquis for one day.  The patient has been discussed with [name]. He states that the apparent mass on MRI is much more likely a stroke. No further follow up with neurosurgery is necessary. The patient will follow up with stroke clinic on discharge.  Discharge to home was anticipated this morning after I visited this patient in his room. However, I received a message from nursing that the patient was now having difficulty answering questions that he had no difficulty with this morning. Repeat CT head was performed and demonstrated no significant change. The patient also had some changes on telemetry. EKG showed these to most likely be just another manifestation of early repolarization abnormality.  Consultants  . Neurosurgery . Neurology  Procedures  . None  Antibiotics   Anti-infectives (From admission, onward)   Start     Dose/Rate Route Frequency Ordered Stop   01/02/20 0000  ceFAZolin (ANCEF) IVPB 2g/100 mL premix  Status:  Discontinued     2 g 200 mL/hr over 30 Minutes Intravenous 30 min pre-op 01/01/20 2039 01/02/20 1958   01/02/20 0000  vancomycin (VANCOCIN) IVPB 1000 mg/200 mL premix  Status:   Discontinued     1,000 mg 200 mL/hr over 60 Minutes Intravenous 60 min pre-op 01/01/20 2039 01/02/20 1958   12/30/19 1100  sulfamethoxazole-trimethoprim (BACTRIM) 400-80 MG per tablet 1 tablet     1 tablet Oral Every M-W-F 12/30/19 1009       Subjective  The patient is resting comfortably. No new complaints.  Objective   Vitals:  Vitals:   01/06/20 0751 01/06/20 1131  BP: (!) 155/74 (!) 154/63  Pulse: (!) 45 (!) 58  Resp: 16 18  Temp: 98.2 F (36.8 C) 98.2 F (36.8 C)  SpO2: 99% 98%   Exam:  Constitutional:  . The patient is awake, alert, and oriented x 3. No acute distress.  Respiratory:  . No increased work of breathing. . No wheezes, rales, or rhonchi . No tactile fremitus Cardiovascular:  . Regular rate and rhythm . No murmurs, ectopy, or gallups. . No lateral PMI. No thrills. Abdomen:  . Abdomen is soft, non-tender, non-distended . No hernias, masses, or organomegaly . Normoactive bowel sounds.  Musculoskeletal:  . No cyanosis, clubbing, or edema Skin:  . No rashes, lesions, ulcers . palpation of skin: no induration or nodules Neurologic:  . CN 2-12 intact . Sensation all 4 extremities intact . Motor function is intact. Psychiatric:  . Mental status o Mood, affect appropriate o Orientation to person, place, time  . judgment and insight appear intact  I have personally reviewed the following:   Today's Data  . Vitals, BMP, CBC, Lipid panel  Imaging  . MRI Brain 12/30/2019, 01/02/2020 . MRA Brain 01/03/2020 . Carotid ultrasound 01/03/2020 . Echocardiogram: pending . CT head without contrast: No evidence for frank hemorrhagic transformation.  Scheduled Meds: . apixaban  5 mg Oral BID  . atorvastatin  80 mg Oral q1800  . brimonidine  1 drop Right Eye BID  . cinacalcet  60 mg Oral Daily  . dexamethasone (DECADRON) injection  8 mg Intravenous Q6H  . docusate sodium  100 mg Oral BID  . dorzolamide-timolol  1 drop Right Eye BID  . famotidine  20 mg Oral  Daily  . finasteride  5 mg Oral Daily  . folic acid  1 mg Oral Daily  . hydrALAZINE  25 mg Oral Q6H  . insulin aspart  0-15 Units Subcutaneous TID WC  . insulin aspart  0-5 Units Subcutaneous QHS  . insulin glargine  20 Units Subcutaneous Daily  . magnesium oxide  400 mg Oral Daily  . metoprolol tartrate  12.5 mg Oral BID  . mycophenolate  360 mg Oral BID  . NIFEdipine  60 mg Oral Daily  . sulfamethoxazole-trimethoprim  1 tablet Oral Q M,W,F  . tacrolimus  2 mg Oral BID  . tamsulosin  0.4 mg Oral QPC breakfast   Continuous Infusions: . lactated ringers Stopped (01/05/20 1800)    Principal Problem:   Brain tumor ([place]) Active Problems:   AKI (acute kidney injury) ([place])   Kidney transplant recipient   Diabetes ([place])   Glaucoma   Hip pain, bilateral   Elevated PSA  Atrial fibrillation, chronic (H[facility])   Cerebral embolism with cerebral infarction   LOS: 7 days   A & P  Brain tumor: Patient with acute onset of confusion, expressive aphasia. 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 This is the source of his symptoms. Neurosurgery had planned to take the patient to the OR for a biopsy. However, as the patient was found to have new CVA on repeat MRI prior to planned biopsy, the brain biopsy was cancelled lieu of stroke work up. Neurology was consulted. [name] has evaluated the patient and feels that Brain Biopsy is no longer necessary. I have discussed the patient with [name]. He believes that the abnormality seen on MRI was infact just a CVA. Stroke work up has been completed. The patient is found to be in atrial fibrillation. Initiation of anticoagulation has been discussed with [name]. As the petechial hemorrhages seen on MRI occurred more than a week ago, [name] recommended starting Eliquis. Pharmacy has been consulted to manage the initiation of Eliquis.  Aniscoria: Likely due to brain mass. Unlikely to be due to  CVA per neurology. Pt is on steroids to address vasogenic edema. Plan is for brain biopsy after stroke work up is completed.   Elevated PSA: Patient had planned to have prostate biopsy on 01/01/2020 at [facility]. Family is deciding if the want the patient to go ahead and have the procedure done here, or wait and have it done at [facility] as originally planned. The patient is also experiencing urinary incontinence. He will be continued on proscar and flomax. He is taking bactrim 3x a week for UTI prophylaxis.  AKI with h/o renal transplantation: No recent labs since almost a year ago, but currently with significantly worsened creatinine/GFR. This may be associated with decreased PO intake in the setting of recent confusion. Creatinine is increasing. Will check a renal ultrasound. Continue IV fluids. Also will avoid nephrotoxic medications and hypotension. He will be continued on Sensipar. Voltaren gel will be held. Will consult nephrology to monitor the status of his renal transplant. He will be continued on prograf and myfortic. Will monitor creatinine, electrolytes, and volume status. Nephrology has been consulted as cretinine is up to 1.54 today.   DM II: Recent A1c 6.8, indicating good control. PO meds (Januvia, Gluocphage, Starlix, Actos) have been held. He will receive Lantus 14 units. His glucoses will be followed with FSBS and SSI. For the last 24 hours glucoses have run from 186 - 285.  HTN: Continue Nifedipine and Labetalol. Blood pressures under fair control.  Atrial fibrillation: Rate controlled with labetalol. Coumadin is on hold due to upcoming prostate biopsy/brain biopsy/petechial hemorrhages on MRI Brain. Monitor on telemetry.The patient has started on eliquis.  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 He complains of pain along the left leg as well as gait instability that appears to be related to the L hip. For now, pain management as  needed with eventual plan for orthopedics consult as outpatient and likely to need THR.  Glaucoma: Continue Alphagan, Cosopt  HLD: Continue Lipitor  Hyperkalemia: Improved with IV fluids. Monitor.  I have seen and examined this patient myself. I have spent 32 minutes in his evaluation and care.  DVT prophylaxis:  SCDs Code Status:  Full - confirmed with patient/family Family Communication: No family available. Disposition Plan: Patient is from home. Plan is to  discharge to home when medically cleared. Will continue to monitor patient on Eliquis for another 24 hours due to prior history of petechial hemorrhage. Anticipate discharge to home tomorrow.  Anthonee Gelin, DO [facility] Direct contact: see [email or web address]  7PM-7AM contact night coverage as above 01/06/2020, 10:44:07 PM  LOS: 1 day   ADDENDUM: I was contacted by nursing this morning and told that the patient was having difficulty answering questions. I went back to revisit the patient. Unlike earlier this morning, the patient now would just repeat the questions that I would ask him back to me. He could parrot back what I told him, but was not coming up with spontaneous answers or speech. Repeat CT of the head was obtained and did not demonstrate any new abnormality. SLP was consulted and now was recommending in patient rehab for the patient. Discharge was cancelled, and PT/OT was asked to re-evaluate the patient and inpatient rehab was consulted. I have discussed the patient in detail with his wife, [name]. All questions were answered to the best of my ability.

## 2020-01-06 NOTE — Evaluation (Signed)
Speech Language Pathology Evaluation Patient Details Name: Ryan Rice MRN: [id number] DOB: 1951/03/22 Today's Date: 01/06/2020 Time: [id number] SLP Time Calculation (min) (ACUTE ONLY): 12 min  Problem List:  Patient Active Problem List   Diagnosis Date Noted  . Cerebral embolism with cerebral infarction 01/03/2020  . Brain tumor ([place]) 12/30/2019  . Hip pain, bilateral 12/30/2019  . Elevated PSA 12/30/2019  . Atrial fibrillation, chronic ([place]) 12/30/2019  . Hypomagnesemia 03/20/2019  . Diabetes ([place]) 03/15/2019  . Glaucoma 03/15/2019  . Scleral hemorrhage of left eye 03/15/2019  . Gout flare 03/15/2019  . Debility 03/08/2019  . AKI (acute kidney injury) ([place])   . CAP (community acquired pneumonia)   . Kidney transplant recipient   . Sepsis ([place]) 02/27/2019   Past Medical History:  Past Medical History:  Diagnosis Date  . Allergic rhinitis   . BPH (benign prostatic hyperplasia)   . Chronic atrial fibrillation ([place])   . Diabetes mellitus ([place])   . DVT (deep venous thrombosis) ([facility])    LLE  . Hematochezia   . HTN (hypertension)   . Hypomagnesemia 03/20/2019  . Kidney transplant recipient   . PVD (peripheral vascular disease) ([place])    Past Surgical History:  Past Surgical History:  Procedure Laterality Date  . KIDNEY TRANSPLANT     HPI:  69 y.o. male with medical history significant of PVD; renal transplant (2013); HTN; DM; and afib presenting with AMS. Pt reporting some expressive aphasia, tremors, urinary incontinence. Pt also reports L hip and leg pain for a few months. 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 Pt reported that he currently is getting home health ST targeting language deficits.    Assessment / Plan / Recommendation Clinical Impression  Pt was seen for a repeat cognitive-linguistic evaluation in the setting of reported worsening expressive aphasia.  Pt was encountered awake/alert and he was very pleasant.  Pt was noted to have increased  expressive and receptive language deficits in comparison to original evaluation on 4/6.  Pt's automatic speech was a relative strength and he was able to state general phrases such as "How are you?" without difficulty.  He repeated single words with 100% accuracy, but demonstrated difficulty repeating phrases.  Pt completed a confrontational naming task with 3/10 accuracy and he demonstrated semantic and phonemic paraphasias in addition to verbal perseveration.  Pt followed 1 step basic commands with 100% accuracy, but only followed 2-step basic commands with 50% accuracy.  Pt participate in an automatic speech task (counting 1-10) without difficulty.  Although route phrases were okay, pt exhibited difficulty with conversational speech tasks, especially when he was asked a question (e.g. "what did you have for lunch").  During conversational speech, pt was noted to have decreased length of utterance with verbal perseveration and phonemic paraphasias observed.  Pt would benefit from 24/7 supervision for safety and home health ST targeting aphasia at time of discharge.  Case manager was made aware of recommendations.  SLP will f/u for treatment per POC.      SLP Assessment  SLP Recommendation/Assessment: Patient needs continued Speech Lanaguage Pathology Services SLP Visit Diagnosis: Aphasia (R47.01)    Follow Up Recommendations  Home health SLP;Inpatient Rehab   Frequency and Duration min 2x/week  2 weeks      SLP Evaluation Cognition  Overall Cognitive Status: Difficult to assess(secondary to langauge deficits ) Arousal/Alertness: Awake/alert       Comprehension  Auditory Comprehension Overall Auditory Comprehension: Impaired Yes/No Questions: Not tested Commands: Impaired One Step  Basic Commands: 75-100% accurate Two Step Basic Commands: 50-74% accurate Conversation: Simple Other Conversation Comments: Pt had difficulty with receptive language particularly in conversational speech  q EffectiveTechniques: Repetition Environmental consultant Discrimination: Not tested Reading Comprehension Reading Status: Not tested    Expression Expression Primary Mode of Expression: Verbal Verbal Expression Overall Verbal Expression: Impaired Initiation: No impairment Automatic Speech: Counting Level of Generative/Spontaneous Verbalization: Phrase Repetition: Impaired Level of Impairment: Phrase level Naming: Impairment Responsive: Not tested Confrontation: Impaired Convergent: 25-49% accurate Divergent: Not tested Verbal Errors: Semantic paraphasias;Phonemic paraphasias Pragmatics: No impairment Other Verbal Expression Comments: Anomia in conversational speech  Written Expression Dominant Hand: Right Written Expression: Not tested   Oral / Motor  Oral Motor/Sensory Function Overall Oral Motor/Sensory Function: Within functional limits Motor Speech Overall Motor Speech: Impaired Respiration: Within functional limits Phonation: Normal Resonance: Within functional limits Articulation: Within functional limitis Intelligibility: Intelligible Motor Planning: Witnin functional limits Motor Speech Errors: Not applicable   GO                   Colin Mulders M.S., CCC-SLP Acute Rehabilitation Services Office: [phone]  [place] 01/06/2020, 12:58 PM

## 2020-01-06 NOTE — Consult Note (Signed)
Physical Medicine and Rehabilitation Consult   Reason for Consult: Functional deficits.  Referring Physician: Dr. Benny Lennert   HPI: Ryan Rice is a 69 y.o. RH-male with history of PVD, renal transplant, CAF, BPH, HTN, who was admitted on 12/30/19 with staring episodes, decrease in verbal output and stuttering.  Patient on chronic coumadin and had been off coumadin in anticipation of GU procedure.  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 EEG without seizure activity and Dr.Cabbell consulted with plans for biopsy on 04/08.  for input on biopsy. Follow up MRI brain on 04/08 showed stable anterior left frontal lobe with hyperintense flare suggesting blood products and new confluent areas of diffusion abnormality left hemisphere gyriform with small areas of chronic encephalomalacia question subacute infarct with petechial hemorrhage or venous infarcts.  Surgery cancelled and  [name] consulted for input. He felt that patient with L-ACA infarcts with hemorrhagic conversion as well as L-MCA infarcts--embolic due to A fib and lack of coumadin. He did not feel that brain biopsy was indicated.   Follow up CT head showed stable evolving L-ACA and left temporoparietal and occipital infarcts. MRV brain was negative for venous sinus thrombosis with hypoplastic left transverse sinus.    2D echo showed EF 55-60% and bilateral atrial dilatation. Carotid dopplers without significant ICA stenosis.  Nephrology consulted for acute on chronic renal failure with hyperkalemia and he was started on IVF for hydration and renal ultrasound ordered for work up. Therapy ongoing  revealing expressive and receptive language deficits with verbal perseveration and phonemic paraphasias, left visual field deficits  and antalgic gait with poor safety awareness. CIR recommended due to functional deficits.      Pt isn't sure when had his LBM, but  feels constipated. Denies pain Admits to smoking occ cigarettes still Is emphatic this is "an adult child" that could help him for rehab.   Review of Systems  Constitutional: Negative for chills and fever.  HENT: Negative for hearing loss and tinnitus.   Eyes: Positive for blurred vision and double vision.  Respiratory: Negative for cough and shortness of breath.   Cardiovascular: Negative for chest pain and leg swelling.  Gastrointestinal: Negative for abdominal pain, heartburn and nausea.  Musculoskeletal: Positive for joint pain (bilateral knee pain).  Skin: Negative for rash.  Neurological: Positive for sensory change (RLE>LLE) and weakness. Negative for dizziness.  Psychiatric/Behavioral: The patient is not nervous/anxious.   All other systems reviewed and are negative.    Past Medical History:  Diagnosis Date  . Allergic rhinitis   . BPH (benign prostatic hyperplasia)   . Chronic atrial fibrillation ([place])   . Diabetes mellitus ([place])   . DVT (deep venous thrombosis) ([facility])    LLE  . Hematochezia   . HTN (hypertension)   . Hypomagnesemia 03/20/2019  . Kidney transplant recipient   . PVD (peripheral vascular disease) ([place])     Past Surgical History:  Procedure Laterality Date  . KIDNEY TRANSPLANT      Family History  Problem Relation Age of Onset  . Stroke Mother   . Heart disease Father   . Diabetes Father   . Diabetes Sister   . Diabetes Brother   . Heart disease Brother     Social History: Married. Independent with cane PTA. Per  reports that he quit smoking about 35 years ago. He quit smokeless tobacco use about 6 years ago. He reports current alcohol use.  He reports that he does not use drugs.    Allergies  Allergen Reactions  . Iodinated Diagnostic Agents Hives  . Tape Hives    Medications Prior to Admission  Medication Sig Dispense Refill  . artificial tears (LACRILUBE) OINT ophthalmic ointment Place into the left eye at bedtime as needed for dry  eyes. 1 Tube 1  . atorvastatin (LIPITOR) 10 MG tablet TAKE 1 TABLET (10 MG TOTAL) BY MOUTH DAILY AT 6 PM. 30 tablet 0  . brimonidine (ALPHAGAN) 0.2 % ophthalmic solution Place 1 drop into the right eye 2 (two) times daily.    . cinacalcet (SENSIPAR) 60 MG tablet Take 60 mg by mouth daily.    . diclofenac Sodium (VOLTAREN) 1 % GEL Apply 4 g topically 2 (two) times daily. Apply to hips and legs    . dorzolamide-timolol (COSOPT) 22.3-6.8 MG/ML ophthalmic solution Place 1 drop into the right eye 2 (two) times a day.    . famotidine (PEPCID) 20 MG tablet Take 1 tablet (20 mg total) by mouth daily. 30 tablet 1  . finasteride (PROSCAR) 5 MG tablet Take 5 mg by mouth daily.    . folic acid (FOLVITE) 1 MG tablet TAKE 1 TABLET BY MOUTH EVERY DAY (Patient taking differently: Take 1 mg by mouth daily. ) 30 tablet 0  . JANUVIA 100 MG tablet Take 100 mg by mouth daily.    Marland Kitchen labetalol (NORMODYNE) 100 MG tablet Take 1 tablet (100 mg total) by mouth 2 (two) times daily.    . magnesium oxide (MAG-OX) 400 MG tablet Take 400 mg by mouth daily.    . metFORMIN (GLUCOPHAGE-XR) 500 MG 24 hr tablet Take 500 mg by mouth 2 (two) times daily.    . mycophenolate (MYFORTIC) 180 MG EC tablet Take 360 mg by mouth 2 (two) times daily.     . nateglinide (STARLIX) 120 MG tablet Take 120 mg by mouth 2 (two) times daily before a meal. On hold if BG is < 130    . NIFEdipine (ADALAT CC) 60 MG 24 hr tablet Take 60 mg by mouth daily.    . pioglitazone (ACTOS) 30 MG tablet Take 15 mg by mouth daily.    Marland Kitchen sulfamethoxazole-trimethoprim (BACTRIM) 400-80 MG tablet Take 1 tablet by mouth every Monday, Wednesday, and Friday.     . tacrolimus (PROGRAF) 1 MG capsule Take 2 mg by mouth 2 (two) times daily.     . tamsulosin (FLOMAX) 0.4 MG CAPS capsule Take 0.4 mg by mouth daily after breakfast.     . warfarin (COUMADIN) 5 MG tablet Take 2.5-5 mg by mouth See admin instructions. Take 2.5mg  on MON Wed THUR FRI SAT, then take 5mg  on Tues and SUN.      . [DISCONTINUED] predniSONE (DELTASONE) 10 MG tablet Take 10 mg by mouth daily with breakfast.      Home: Home Living Family/patient expects to be discharged to:: Private residence Living Arrangements: Spouse/significant other Available Help at Discharge: Available 24 hours/day, Family Type of Home: House Home Access: Stairs to enter, Other (comment) Entrance Stairs-Number of Steps: 1STE back door and able ot use RW (main entryway) and 5STE without handrails on front enttrance Entrance Stairs-Rails: None Home Layout: One level Bathroom Shower/Tub: Gaffer, Chiropodist: Standard Home Equipment: Environmental consultant - 2 wheels Additional Comments: no DME  Lives With: Spouse, Family(wife and granddaughter )  Functional History: Prior Function Level of Independence: Independent Comments: enjoys fishing Functional Status:  Mobility: Bed Mobility Overal bed  mobility: Modified Independent General bed mobility comments: requires supervision for impulsivity Transfers Overall transfer level: Needs assistance Equipment used: Rolling walker (2 wheeled) Transfers: Sit to/from Stand Sit to Stand: Min guard General transfer comment: min guard for safety Ambulation/Gait Ambulation/Gait assistance: Min guard, Min assist, Mod assist Gait Distance (Feet): (80 ft X 2 trials (with and without AD)) Assistive device: Rolling walker (2 wheeled), None(pt using rail, other objects and assist at trunk w/gait belt) Gait Pattern/deviations: Trunk flexed, Step-through pattern, Decreased stride length, Drifts right/left General Gait Details: assistance required for balance without use of AD and min guard assist for safety with use of RW; pt with unsafe gait speeds at times and max directional cues  Gait velocity: varying  Gait velocity interpretation: <1.8 ft/sec, indicate of risk for recurrent falls Stairs: Yes Stairs assistance: Min assist Stair Management: Two rails, Alternating  pattern, Forwards Number of Stairs: (3 steps then 2 shorter steps) General stair comments: cues for safety and step to pattern; pt alternating pattern despite cues    ADL: ADL Overall ADL's : Needs assistance/impaired Eating/Feeding: Set up, Bed level Grooming: Wash/dry hands, Wash/dry face, Oral care, Min guard, Minimal assistance, Standing, Cueing for safety Grooming Details (indicate cue type and reason): Completed in standing, no LOB, but ataxic movement noted throughout Lower Body Bathing: Min guard, Minimal assistance Lower Body Bathing Details (indicate cue type and reason): Completed in standing at sink, min A for safety and balance Toilet Transfer: Min guard, Cueing for safety, Cueing for sequencing Toilet Transfer Details (indicate cue type and reason): Simulated with transfer to recliner after functional ambulation Functional mobility during ADLs: Min guard, Minimal assistance General ADL Comments: Pt continues to demonstrate cognitive deficits when completing trail mapping tasks, pt refused ADLs this date  Cognition: Cognition Overall Cognitive Status: Impaired/Different from baseline Arousal/Alertness: Awake/alert Orientation Level: Disoriented to situation, Disoriented to time, Disoriented to place, Oriented to person Attention: Sustained Sustained Attention: Impaired Sustained Attention Impairment: Verbal complex Memory: Impaired Memory Impairment: Decreased short term memory Decreased Short Term Memory: Verbal basic Immediate Memory Recall: Sock, Blue, Bed Memory Recall Sock: With Cue Memory Recall Blue: With Cue Memory Recall Bed: Without Cue Awareness: Impaired Awareness Impairment: Intellectual impairment Problem Solving: Impaired Problem Solving Impairment: Verbal complex, Verbal basic Executive Function: Reasoning Reasoning: Impaired Reasoning Impairment: Verbal complex Behaviors: Perseveration Safety/Judgment: Impaired Cognition Arousal/Alertness:  Awake/alert Behavior During Therapy: Impulsive, Flat affect Overall Cognitive Status: Impaired/Different from baseline Area of Impairment: Attention, Memory, Following commands, Safety/judgement, Problem solving, Orientation Orientation Level: Time, Situation Current Attention Level: Sustained Memory: Decreased short-term memory Following Commands: Follows one step commands inconsistently Safety/Judgement: Decreased awareness of safety, Decreased awareness of deficits Awareness: Emergent Problem Solving: Slow processing, Requires tactile cues, Difficulty sequencing, Requires verbal cues General Comments: unable to perform standardized balance assessment due to pt's inability to follow commands; pt urinated into cup on table instead of using urinal on bed rail and unaware that bed was soiled Difficult to assess due to: (aphasia)   Blood pressure (!) 161/70, pulse (!) 53, temperature 97.6 F (36.4 C), temperature source Oral, resp. rate 18, height 5\' 11"  (1.803 m), weight 86.6 kg, SpO2 100 %. Physical Exam  Nursing note and vitals reviewed. Constitutional: He is oriented to person, place, and time. He appears well-developed and well-nourished. No distress.  Pt sitting up in bed; appropriate, appears comfortable, NAD  HENT:  Head: Normocephalic and atraumatic.  Nose: Nose normal.  Mouth/Throat: Oropharynx is clear and moist. No oropharyngeal exudate.  No facial droop Tongue  midline No facial sensation change  Eyes: Conjunctivae are normal.  Unable to get L eye to medline completely with EOMs No nystagmus seen Appears to have possible visual field, but not clear due to aphasia  Neck: No tracheal deviation present.  Cardiovascular:  Pt in afib, rate controlled  Respiratory: No stridor.  CTA B/L- good air movement B/L  GI:  Soft, NT< slightly distended, normoactive BS  Musculoskeletal:        General: No edema.     Cervical back: Normal range of motion and neck supple.      Comments: RUE deltoid, biceps, triceps 5/5; grip 4/5, couldn't understand finger abd to test LUE 5/5 except finger abd- didn't test as above RLE- HF 4/5, KE/KF 4+/5, DF and PF 5-/5 LLE 5/5 in same muscles   Neurological: He is alert and oriented to person, place, and time.  Speech with minimal dysarthria. Expressive deficits with occasional perseveration requiring redirection. He was able to follow simple one step motor commands with occasional cues and unable to follow two step commands. Unable to point to items consistently without cues for redirection.   Could only follow 1 step directions with multiple cues Multiple paraphasias and word substitutions heard Unable to name apple, pen, glasses- perseverative  Skin: Skin is warm and dry. He is not diaphoretic.  Psychiatric:  Appropriate, calm    Results for orders placed or performed during the hospital encounter of 12/29/19 (from the past 24 hour(s))  Glucose, capillary     Status: Abnormal   Collection Time: 01/06/20 11:26 AM  Result Value Ref Range   Glucose-Capillary 227 (H) 70 - 99 mg/dL   Comment 1 Notify RN    Comment 2 Document in Chart   Troponin I (High Sensitivity)     Status: Abnormal   Collection Time: 01/06/20  2:07 PM  Result Value Ref Range   Troponin I (High Sensitivity) 30 (H) <18 ng/L  Troponin I (High Sensitivity)     Status: Abnormal   Collection Time: 01/06/20  3:13 PM  Result Value Ref Range   Troponin I (High Sensitivity) 25 (H) <18 ng/L  Glucose, capillary     Status: Abnormal   Collection Time: 01/06/20  3:59 PM  Result Value Ref Range   Glucose-Capillary 263 (H) 70 - 99 mg/dL   Comment 1 Notify RN    Comment 2 Document in Chart   Creatinine, urine, random     Status: None   Collection Time: 01/06/20  6:44 PM  Result Value Ref Range   Creatinine, Urine 44.46 mg/dL  Sodium, urine, random     Status: None   Collection Time: 01/06/20  6:44 PM  Result Value Ref Range   Sodium, Ur 24 mmol/L  Glucose,  capillary     Status: Abnormal   Collection Time: 01/06/20  9:14 PM  Result Value Ref Range   Glucose-Capillary 155 (H) 70 - 99 mg/dL  Renal function panel     Status: Abnormal   Collection Time: 01/07/20  3:30 AM  Result Value Ref Range   Sodium 131 (L) 135 - 145 mmol/L   Potassium 4.7 3.5 - 5.1 mmol/L   Chloride 104 98 - 111 mmol/L   CO2 20 (L) 22 - 32 mmol/L   Glucose, Bld 118 (H) 70 - 99 mg/dL   BUN 51 (H) 8 - 23 mg/dL   Creatinine, Ser 1.18 0.61 - 1.24 mg/dL   Calcium 8.8 (L) 8.9 - 10.3 mg/dL   Phosphorus 2.6 2.5 -  4.6 mg/dL   Albumin 2.3 (L) 3.5 - 5.0 g/dL   GFR calc non Af Amer >60 >60 mL/min   GFR calc Af Amer >60 >60 mL/min   Anion gap 7 5 - 15  CBC with Differential/Platelet     Status: Abnormal   Collection Time: 01/07/20  3:30 AM  Result Value Ref Range   WBC 10.1 4.0 - 10.5 K/uL   RBC 5.41 4.22 - 5.81 MIL/uL   Hemoglobin 14.3 13.0 - 17.0 g/dL   HCT 44.1 39.0 - 52.0 %   MCV 81.5 80.0 - 100.0 fL   MCH 26.4 26.0 - 34.0 pg   MCHC 32.4 30.0 - 36.0 g/dL   RDW 14.4 11.5 - 15.5 %   Platelets 174 150 - 400 K/uL   nRBC 0.0 0.0 - 0.2 %   Neutrophils Relative % 89 %   Neutro Abs 9.0 (H) 1.7 - 7.7 K/uL   Lymphocytes Relative 4 %   Lymphs Abs 0.4 (L) 0.7 - 4.0 K/uL   Monocytes Relative 5 %   Monocytes Absolute 0.5 0.1 - 1.0 K/uL   Eosinophils Relative 0 %   Eosinophils Absolute 0.0 0.0 - 0.5 K/uL   Basophils Relative 0 %   Basophils Absolute 0.0 0.0 - 0.1 K/uL   Immature Granulocytes 2 %   Abs Immature Granulocytes 0.18 (H) 0.00 - 0.07 K/uL  Basic metabolic panel     Status: Abnormal   Collection Time: 01/07/20  3:30 AM  Result Value Ref Range   Sodium 132 (L) 135 - 145 mmol/L   Potassium 4.8 3.5 - 5.1 mmol/L   Chloride 104 98 - 111 mmol/L   CO2 21 (L) 22 - 32 mmol/L   Glucose, Bld 120 (H) 70 - 99 mg/dL   BUN 50 (H) 8 - 23 mg/dL   Creatinine, Ser 1.19 0.61 - 1.24 mg/dL   Calcium 8.9 8.9 - 10.3 mg/dL   GFR calc non Af Amer >60 >60 mL/min   GFR calc Af Amer >60  >60 mL/min   Anion gap 7 5 - 15  Glucose, capillary     Status: Abnormal   Collection Time: 01/07/20  6:24 AM  Result Value Ref Range   Glucose-Capillary 136 (H) 70 - 99 mg/dL   CT HEAD WO CONTRAST  Result Date: 01/06/2020 CLINICAL DATA:  Stroke, follow-up. EXAM: CT HEAD WITHOUT CONTRAST TECHNIQUE: Contiguous axial images were obtained from the base of the skull through the vertex without intravenous contrast. COMPARISON:  Prior head CT examinations 01/04/2020 and earlier, brain MRI 01/02/2020, brain MRI 12/30/2019. FINDINGS: Brain: No significant change in size of a paramedian anterior left frontal lobe lesion. As before, there is associated petechial hemorrhage without frank hemorrhagic conversion. Also unchanged, there is partial effacement of the anterior horn of the left lateral ventricle. No midline shift. An evolving subacute ischemic infarct involving portions of the left temporoparietal and occipital lobes has not significantly changed in size. Question minimal associated petechial hemorrhage. No frank hemorrhagic conversion. No new demarcated cortical infarct is identified. Redemonstrated small chronic cortically based infarcts within the right parietal and occipital lobes. Stable background chronic small vessel ischemic disease and generalized parenchymal atrophy again. No hydrocephalus. No extra-axial fluid collection. Vascular: No hyperdense vessel. Skull: Normal. Negative for fracture or focal lesion. Sinuses/Orbits: Visualized orbits demonstrate no acute abnormality. Minimal ethmoid sinus mucosal thickening. No significant mastoid effusion. IMPRESSION: 1. Unchanged size and appearance of a lesion involving the paramedian anterior left frontal lobe. Please refer to prior  brain MRI 01/02/2020 for differential considerations. Unchanged petechial hemorrhage associated with this lesion. 2. An evolving subacute ischemic infarct involving portions of the left temporoparietal and occipital lobes has  not significantly changed in size. There may be minimal associated petechial hemorrhage without frank hemorrhagic conversion. 3. No worsening mass effect or midline shift. 4. Redemonstrated chronic cortically based infarcts within the right parietal and occipital lobes. 5. Stable background generalized parenchymal atrophy and chronic small vessel ischemic disease. 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 Does the need for close, 24 hr/day medical supervision in concert with the patient's rehab needs make it unreasonable for this patient to be served in a less intensive setting? Yes and Potentially 2. Co-Morbidities requiring supervision/potential complications: aphasia, visual field deficits, renal transplant, Afib, PVD 3. Due to bladder management, bowel management, safety, skin/wound care, disease management, medication administration, pain management and patient education, does the patient require 24 hr/day rehab nursing? Yes and Potentially 4. Does the patient require coordinated care of a physician, rehab nurse, therapy disciplines of PT, OT and SLP to address physical and functional deficits in the context of the above medical diagnosis(es)? Yes and Potentially Addressing deficits in the following areas: balance and endurance locomotion, transfers, bathing, dressing, cognition, speech, swallowing 5. Can the patient actively participate in an intensive therapy program of at least 3 hrs of therapy per day at least 5 days per week? Yes 6. The potential for patient to make measurable gains while on inpatient rehab is good 7. Anticipated functional outcomes upon discharge from inpatient rehab are supervision and min assist  with PT, supervision and min assist with OT, supervision and min assist with SLP. 8. Estimated rehab length of stay to reach the above  functional goals is: 2-2.5 weeks 9. Anticipated discharge destination: Other 10. Overall Rehab/Functional Prognosis: good  RECOMMENDATIONS: This patient's condition is appropriate for continued rehabilitative care in the following setting: CIR and SNF Patient has agreed to participate in recommended program. Potentially Note that insurance prior authorization may be required for reimbursement for recommended care.  Comment:  1. Pt doesn't have a clear dispo- we will look into this and see if possible to come to inpt rehab 2. Suggest Amantadine 100 mg daily for now for aphasia possible recovery 3. Is constipated, per pt- suggest bowel aids 4. If pt unable ot come to CIR, suggest f/u with [facility] [phone] myself or Dr Letta Pate who is out stroke specialist.  5. Thank you for this consult- will determine if can get to CIR.   Bary Leriche, PA-C 01/07/2020   I have personally performed a face to face diagnostic evaluation of this patient and formulated the key components of the plan.  Additionally, I have personally reviewed laboratory data, imaging studies, as well as relevant notes and concur with the physician assistant's documentation above.

## 2020-01-06 NOTE — Progress Notes (Addendum)
Physical Therapy Treatment Patient Details Name: Ryan Rice MRN: [id number] DOB: [birth year] Today's Date: 01/06/2020    History of Present Illness 69 y.o. male with medical history significant of PVD; renal transplant (2013); HTN; DM; and afib presenting with AMS. Pt reporting some expressive aphasia, tremors, urinary incontinence. Pt also reports L hip and leg pain for a few months. 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 Treatment in place for infarct vs neoplasm    PT Comments    Patient seen for mobility progression. Pt in bed upon arrival and appeared eager to mobilize. Pt presents with further impaired balance compared to previous PT session and with significant cognitive deficits increasing risk for falls. Prior to admission pt was independent without AD. Today pt requires min-mod A for gait training without use of AD and max directional cues to navigate environment. Recommend CIR for further skilled PT services to maximize independence and safety with mobility and decrease caregiver burden.     Follow Up Recommendations  Supervision/Assistance - 24 hour;CIR     Equipment Recommendations  None recommended by PT    Recommendations for Other Services       Precautions / Restrictions Precautions Precautions: Fall Restrictions Weight Bearing Restrictions: No    Mobility  Bed Mobility Overal bed mobility: Modified Independent             General bed mobility comments: requires supervision for impulsivity  Transfers Overall transfer level: Needs assistance Equipment used: Rolling walker (2 wheeled) Transfers: Sit to/from Stand Sit to Stand: Min guard         General transfer comment: min guard for safety  Ambulation/Gait Ambulation/Gait assistance: Min guard;Min assist;Mod  assist Gait Distance (Feet): (80 ft X 2 trials (with and without AD)) Assistive device: Rolling walker (2 wheeled);None(pt using rail, other objects and assist at trunk w/gait belt) Gait Pattern/deviations: Trunk flexed;Step-through pattern;Decreased stride length;Drifts right/left Gait velocity: varying    General Gait Details: assistance required for balance without use of AD and min guard assist for safety with use of RW; pt with unsafe gait speeds at times and max directional cues    Stairs             Wheelchair Mobility    Modified Rankin (Stroke Patients Only)       Balance Overall balance assessment: Needs assistance Sitting-balance support: No upper extremity supported;Feet supported Sitting balance-Leahy Scale: Good     Standing balance support: Single extremity supported;During functional activity;Bilateral upper extremity supported Standing balance-Leahy Scale: Poor                              Cognition Arousal/Alertness: Awake/alert Behavior During Therapy: Impulsive;Flat affect Overall Cognitive Status: Impaired/Different from baseline Area of Impairment: Attention;Memory;Following commands;Safety/judgement;Problem solving;Orientation                 Orientation Level: Time;Situation Current Attention Level: Sustained Memory: Decreased short-term memory Following Commands: Follows one step commands inconsistently Safety/Judgement: Decreased awareness of safety;Decreased awareness of deficits   Problem Solving: Slow processing;Requires tactile cues;Difficulty sequencing;Requires verbal cues General Comments: unable to perform standardized balance assessment due to pt's inability to follow commands; pt urinated into cup on table instead of using urinal on bed rail and unaware that bed was soiled      Exercises  General Comments        Pertinent Vitals/Pain Pain Assessment: No/denies pain    Home Living                       Prior Function            PT Goals (current goals can now be found in the care plan section) Progress towards PT goals: Progressing toward goals    Frequency    Min 3X/week      PT Plan Discharge plan needs to be updated    Co-evaluation              AM-PAC PT "6 Clicks" Mobility   Outcome Measure  Help needed turning from your back to your side while in a flat bed without using bedrails?: None Help needed moving from lying on your back to sitting on the side of a flat bed without using bedrails?: None Help needed moving to and from a bed to a chair (including a wheelchair)?: A [place] Help needed standing up from a chair using your arms (e.g., wheelchair or bedside chair)?: A [place] Help needed to walk in hospital room?: A Lot Help needed climbing 3-5 steps with a railing? : A Lot 6 Click Score: 18    End of Session Equipment Utilized During Treatment: Gait belt Activity Tolerance: Patient tolerated treatment well Patient left: in chair;with call bell/phone within reach;with chair alarm set Nurse Communication: Mobility status PT Visit Diagnosis: Other abnormalities of gait and mobility (R26.89);Other symptoms and signs involving the nervous system [id number])     Time: [id number] PT Time Calculation (min) (ACUTE ONLY): 22 min  Charges:  $Gait Training: 8-22 mins                     [name], PTA Acute Rehabilitation Services Pager: [phone] Office: [phone]     [name] 01/06/2020, 4:54 PM

## 2020-01-06 NOTE — Evaluation (Signed)
Clinical/Bedside Swallow Evaluation Patient Details  Name: Ryan Rice MRN: [id number] Date of Birth: 06/24/1951  Today's Date: 01/06/2020 Time: SLP Start Time (ACUTE ONLY): 1210 SLP Stop Time (ACUTE ONLY): 1222 SLP Time Calculation (min) (ACUTE ONLY): 12 min  Past Medical History:  Past Medical History:  Diagnosis Date  . Allergic rhinitis   . BPH (benign prostatic hyperplasia)   . Chronic atrial fibrillation ([place])   . Diabetes mellitus ([place])   . DVT (deep venous thrombosis) ([facility])    LLE  . Hematochezia   . HTN (hypertension)   . Hypomagnesemia 03/20/2019  . Kidney transplant recipient   . PVD (peripheral vascular disease) ([place])    Past Surgical History:  Past Surgical History:  Procedure Laterality Date  . KIDNEY TRANSPLANT     HPI:  69 y.o. male with medical history significant of PVD; renal transplant (2013); HTN; DM; and afib presenting with AMS. Pt reporting some expressive aphasia, tremors, urinary incontinence. Pt also reports L hip and leg pain for a few months. 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 Pt reported that he currently is getting home health ST targeting language deficits.    Assessment / Plan / Recommendation Clinical Impression  Pt was seen for a bedside swallow evaluation and he presents with what appears to be functional oropharyngeal swallowing abilities.  Pt has been on regular solids and thin liquids since admission and RN reported that pt is tolerating his current diet without difficulty.  Pt was seen with trials from his lunch meal tray (meat, broccoli, macaroni and cheese, etc.) He demonstrated timely mastication, suspected timely AP transport and swallow initiation, and consistent hyolaryngeal elevation/excursion to observation.  No overt s/sx of aspiration were observed with any trials.  Pt was able to feed himself without assistance.  Recommend continuation of regular solids and thin liquids with medications administered whole with liquid.   No further skilled ST is warranted targeting dysphagia.  Please re-consult if additional needs arise.  SLP will f/u for cognitive-linguistic treatment per POC.    SLP Visit Diagnosis: Dysphagia, unspecified (R13.10)    Aspiration Risk  No limitations    Diet Recommendation Regular;Thin liquid   Liquid Administration via: Cup;Straw Medication Administration: Whole meds with liquid Supervision: Patient able to self feed;Intermittent supervision to cue for compensatory strategies Compensations: Slow rate;Small sips/bites;Minimize environmental distractions Postural Changes: Seated upright at 90 degrees    Other  Recommendations Oral Care Recommendations: Oral care BID   Follow up Recommendations None(for swallowing )        Swallow Study   General HPI: 69 y.o. male with medical history significant of PVD; renal transplant (2013); HTN; DM; and afib presenting with AMS. Pt reporting some expressive aphasia, tremors, urinary incontinence. Pt also reports L hip and leg pain for a few months. 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 Pt reported that he currently is getting home health ST targeting language deficits.  Type of Study: Bedside Swallow Evaluation Previous Swallow Assessment: BSE 03/03/2019 Diet Prior to this Study: Regular;Thin liquids Temperature Spikes Noted: No Respiratory Status: Room air History of Recent Intubation: No Behavior/Cognition: Alert;Cooperative;Pleasant mood Oral Cavity Assessment: Within Functional Limits Oral Care Completed by SLP: No Oral Cavity - Dentition: Adequate natural dentition Vision: Functional for self-feeding Self-Feeding Abilities: Able to feed self Patient Positioning: Upright in chair Baseline Vocal Quality: Normal Volitional Swallow: Able to elicit    Oral/Motor/Sensory Function Overall Oral Motor/Sensory Function: Within functional limits   Ice Chips Ice chips: Not  tested   Thin Liquid Thin Liquid: Within functional  limits Presentation: Straw;Self Fed    Nectar Thick Nectar Thick Liquid: Not tested   Honey Thick Honey Thick Liquid: Not tested   Puree Puree: Not tested   Solid     Solid: Within functional limits Presentation: [place] M[facility]S., [place] Office: [phone]  [place] 01/06/2020,12:40 PM

## 2020-01-07 ENCOUNTER — Inpatient Hospital Stay (HOSPITAL_COMMUNITY): Payer: Medicare HMO

## 2020-01-07 DIAGNOSIS — D496 Neoplasm of unspecified behavior of brain: Secondary | ICD-10-CM

## 2020-01-07 LAB — CBC WITH DIFFERENTIAL/PLATELET
Abs Immature Granulocytes: 0.18 10*3/uL — ABNORMAL HIGH (ref 0.00–0.07)
Basophils Absolute: 0 10*3/uL (ref 0.0–0.1)
Basophils Relative: 0 %
Eosinophils Absolute: 0 10*3/uL (ref 0.0–0.5)
Eosinophils Relative: 0 %
HCT: 44.1 % (ref 39.0–52.0)
Hemoglobin: 14.3 g/dL (ref 13.0–17.0)
Immature Granulocytes: 2 %
Lymphocytes Relative: 4 %
Lymphs Abs: 0.4 10*3/uL — ABNORMAL LOW (ref 0.7–4.0)
MCH: 26.4 pg (ref 26.0–34.0)
MCHC: 32.4 g/dL (ref 30.0–36.0)
MCV: 81.5 fL (ref 80.0–100.0)
Monocytes Absolute: 0.5 10*3/uL (ref 0.1–1.0)
Monocytes Relative: 5 %
Neutro Abs: 9 10*3/uL — ABNORMAL HIGH (ref 1.7–7.7)
Neutrophils Relative %: 89 %
Platelets: 174 10*3/uL (ref 150–400)
RBC: 5.41 MIL/uL (ref 4.22–5.81)
RDW: 14.4 % (ref 11.5–15.5)
WBC: 10.1 10*3/uL (ref 4.0–10.5)
nRBC: 0 % (ref 0.0–0.2)

## 2020-01-07 LAB — GLUCOSE, CAPILLARY
Glucose-Capillary: 136 mg/dL — ABNORMAL HIGH (ref 70–99)
Glucose-Capillary: 218 mg/dL — ABNORMAL HIGH (ref 70–99)
Glucose-Capillary: 198 mg/dL — ABNORMAL HIGH (ref 70–99)
Glucose-Capillary: 279 mg/dL — ABNORMAL HIGH (ref 70–99)

## 2020-01-07 LAB — BASIC METABOLIC PANEL
Anion gap: 7 (ref 5–15)
BUN: 50 mg/dL — ABNORMAL HIGH (ref 8–23)
CO2: 21 mmol/L — ABNORMAL LOW (ref 22–32)
Calcium: 8.9 mg/dL (ref 8.9–10.3)
Chloride: 104 mmol/L (ref 98–111)
Creatinine, Ser: 1.19 mg/dL (ref 0.61–1.24)
GFR calc Af Amer: >60 mL/min (ref >60)
GFR calc non Af Amer: >60 mL/min (ref >60)
Glucose, Bld: 120 mg/dL — ABNORMAL HIGH (ref 70–99)
Potassium: 4.8 mmol/L (ref 3.5–5.1)
Sodium: 132 mmol/L — ABNORMAL LOW (ref 135–145)

## 2020-01-07 LAB — RENAL FUNCTION PANEL
Albumin: 2.3 g/dL — ABNORMAL LOW (ref 3.5–5.0)
Anion gap: 7 (ref 5–15)
BUN: 51 mg/dL — ABNORMAL HIGH (ref 8–23)
CO2: 20 mmol/L — ABNORMAL LOW (ref 22–32)
Calcium: 8.8 mg/dL — ABNORMAL LOW (ref 8.9–10.3)
Chloride: 104 mmol/L (ref 98–111)
Creatinine, Ser: 1.18 mg/dL (ref 0.61–1.24)
GFR calc Af Amer: >60 mL/min (ref >60)
GFR calc non Af Amer: >60 mL/min (ref >60)
Glucose, Bld: 118 mg/dL — ABNORMAL HIGH (ref 70–99)
Phosphorus: 2.6 mg/dL (ref 2.5–4.6)
Potassium: 4.7 mmol/L (ref 3.5–5.1)
Sodium: 131 mmol/L — ABNORMAL LOW (ref 135–145)

## 2020-01-07 LAB — SARS CORONAVIRUS 2 (TAT 6-24 HRS): SARS Coronavirus 2: NEGATIVE

## 2020-01-07 NOTE — Progress Notes (Signed)
Patient ID: Ryan Rice, male   DOB: 1950/11/05, 69 y.o.   MRN: [id number] [facility] Progress Note   Assessment/ Plan:   1.  Acute kidney injury on chronic kidney disease stage II T:  Fractional excretion of sodium 0.7% with improvement of creatinine/renal function overnight after getting intravenous fluids-this being indicative of intravascular volume contraction likely from poor oral intake/net negative fluid balance since admission.  Nonoliguric overnight and without any acute electrolyte abnormalities.  I reminded him of the need for liberal fluid intake status post renal transplant. 2.  Hyperkalemia:  Improved with intravenous fluids/forced diuresis. 3.  Altered mental status/expressive aphasia: With imaging showing an evolving subacute left ACA territory infarct with associated petechial hemorrhage.  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 4.  Hypertension: Blood pressure elevated likely as a result of saline loading-we will discontinue normal saline at this time and allow for oral intake. 5.  Atrial fibrillation: Rate controlled on labetalol and plans noted to begin Eliquis which may need to be coordinated closely with prostate biopsy timing. 6.  Diabetes mellitus: Closely managed by Dr. Elyse Hsu as an outpatient.  Current inpatient management per primary service.  Subjective:   Reports to be feeling fair, denies any chest pain or shortness of breath.  Overnight telemetry events noted   Objective:   BP (!) 161/70 (BP Location: Right Arm)   Pulse (!) 53   Temp 97.6 F (36.4 C) (Oral)   Resp 18   Ht 5\' 11"  (1.803 m)   Wt 86.6 kg   SpO2 100%   BMI 26.64 kg/m   Intake/Output Summary (Last 24 hours) at 01/07/2020 0750 Last data filed at 01/07/2020 0530 Gross per 24 hour  Intake 840 ml  Output 1950 ml  Net -1110 ml   Weight change:   Physical Exam: Gen: Comfortably resting in bed, eating breakfast CVS: Pulse regular rhythm,  bradycardia, S1 and S2 Resp: Clear to auscultation bilaterally, no rales/rhonchi Abd: Soft, flat, nontender Ext: No lower extremity edema.  Left upper arm AVF  Imaging: CT HEAD WO CONTRAST  Result Date: 01/06/2020 CLINICAL DATA:  Stroke, follow-up. EXAM: CT HEAD WITHOUT CONTRAST TECHNIQUE: Contiguous axial images were obtained from the base of the skull through the vertex without intravenous contrast. COMPARISON:  Prior head CT examinations 01/04/2020 and earlier, brain MRI 01/02/2020, brain MRI 12/30/2019. FINDINGS: Brain: No significant change in size of a paramedian anterior left frontal lobe lesion. As before, there is associated petechial hemorrhage without frank hemorrhagic conversion. Also unchanged, there is partial effacement of the anterior horn of the left lateral ventricle. No midline shift. An evolving subacute ischemic infarct involving portions of the left temporoparietal and occipital lobes has not significantly changed in size. Question minimal associated petechial hemorrhage. No frank hemorrhagic conversion. No new demarcated cortical infarct is identified. Redemonstrated small chronic cortically based infarcts within the right parietal and occipital lobes. Stable background chronic small vessel ischemic disease and generalized parenchymal atrophy again. No hydrocephalus. No extra-axial fluid collection. Vascular: No hyperdense vessel. Skull: Normal. Negative for fracture or focal lesion. Sinuses/Orbits: Visualized orbits demonstrate no acute abnormality. Minimal ethmoid sinus mucosal thickening. No significant mastoid effusion. IMPRESSION: 1. Unchanged size and appearance of a lesion involving the paramedian anterior left frontal lobe. Please refer to prior brain MRI 01/02/2020 for differential considerations. Unchanged petechial hemorrhage associated with this lesion. 2. An evolving subacute ischemic infarct involving portions of the left temporoparietal and occipital lobes has not  significantly changed  in size. There may be minimal associated petechial hemorrhage without frank hemorrhagic conversion. 3. No worsening mass effect or midline shift. 4. Redemonstrated chronic cortically based infarcts within the right parietal and occipital lobes. 5. Stable background generalized parenchymal atrophy and chronic small vessel ischemic disease. Electronically Signed   By: Kellie Simmering DO   On: 01/06/2020 13:21    Labs: BMET Recent Labs  Lab 01/02/20 0430 01/04/20 0418 01/06/20 0337 01/07/20 0330  NA 136 135 131* 132*  131*  K 4.2 4.5 5.1 4.8  4.7  CL 108 107 104 104  104  CO2 18* 18* 21* 21*  20*  GLUCOSE 160* 172* 182* 120*  118*  BUN 33* 41* 54* 50*  51*  CREATININE 1.14 1.22 1.56* 1.19  1.18  CALCIUM 8.6* 9.0 8.7* 8.9  8.8*  PHOS  --   --   --  2.6   CBC Recent Labs  Lab 01/02/20 0430 01/04/20 0418 01/06/20 0337 01/07/20 0330  WBC 7.4 9.1 9.7 10.1  NEUTROABS 6.8 8.3* 8.7* 9.0*  HGB 12.7* 14.0 14.3 14.3  HCT 39.1 43.7 43.8 44.1  MCV 83.5 84.4 83.3 81.5  PLT 130* 161 182 174    Medications:    . apixaban  5 mg Oral BID  . atorvastatin  80 mg Oral q1800  . brimonidine  1 drop Right Eye BID  . cinacalcet  60 mg Oral Daily  . dexamethasone (DECADRON) injection  8 mg Intravenous Q6H  . docusate sodium  100 mg Oral BID  . dorzolamide-timolol  1 drop Right Eye BID  . famotidine  20 mg Oral Daily  . finasteride  5 mg Oral Daily  . folic acid  1 mg Oral Daily  . hydrALAZINE  25 mg Oral Q6H  . insulin aspart  0-15 Units Subcutaneous TID WC  . insulin aspart  0-5 Units Subcutaneous QHS  . insulin glargine  20 Units Subcutaneous Daily  . magnesium oxide  400 mg Oral Daily  . metoprolol tartrate  12.5 mg Oral BID  . mycophenolate  360 mg Oral BID  . NIFEdipine  60 mg Oral Daily  . sulfamethoxazole-trimethoprim  1 tablet Oral Q M,W,F  . tacrolimus  2 mg Oral BID  . tamsulosin  0.4 mg Oral QPC breakfast   Elmarie Shiley, MD 01/07/2020, 7:50 AM

## 2020-01-07 NOTE — Progress Notes (Addendum)
PROGRESS NOTE  Ryan Rice [id number] DOB: [birth year] DOA: 12/29/2019 PCP: Drosinis, Pamalee Leyden, PA-C  Brief History    Ryan Rice is a 69 y.o. male with medical history significant of PVD; renal transplant (2013); HTN; DM; and afib presenting with AMS.  He presented to [facility] last night for AMS.  His wife noticed Saturday that he wasn't talking much, was staring.  When asked a question, he would hesitate and stutter.  They were concerned about a ministroke.  He did not seem to have other symptoms.  She saw a Repetto of this Friday but Saturday it was much worse.  No headaches or vision changes.  He has noticed expressive aphasia. Occasional tremor.  Intermittent urinary incontinence.   He is usually a talker and less able to do that.  No apparent changes in executive function.  The patient denies unintentional weight loss or night sweats.  He is eating and drinking ok.  He was recently taken off of his anticoagulation by his PCP in preparation for a prostate biopsy that was to have taken place this week at [facility]. The patient has had an elevated PSA. It was 9.34 in 10/2019, up from 6.79 in 05/2019.  He also has left hip and leg pain.  This has been going on for a couple of months and is about the same.  It hurts in his hip and radiates down his entire leg.  No back pain.   Neurosurgery has been consulted. The plan was to perform brain biopsy on 01/02/2020. However, repeat MRI performed prior to procedure demonstrated new infarct. The procedure has been postponed until the patient has completed work up for the CVA. Neurology has been consulted and stroke work up initiated.  Echocardiogram demonstrates EF of 55-60%, Nl LV function. No regional wall motion abnormalities. Mild LVH is present and LV diastolic function could not be evaluated. RV systolic function is normal. They were unable to assess PA pressure due to inadequate signal from tricuspid regurgitation. LA and RA were moderately dilated. LV  function has improved from previous. No intracardiac source of thrombus was seen.  Carotid ultrasound: BL ICA demonstrate 1-39% stenosis. Antegrade flow of bilateral vertebral arteries.   EKG demonstrates atrial fibrillation with a controlled ventricular response. Initiation of anticoagulation has been discussed with [name]. As the petechial hemorrhages seen on MRI occurred more than a week ago, [name] recommends starting Eliquis. Pharmacy has been consulted to manage the initiation of Eliquis.  On 01/04/2020 the patient had a 2 second pause on telemetry. Labetalol 100 mg bid has been stopped and the patient has been changed to metoprolol 12.5 mg bid. This change seems to have addressed this problem as well as the patient's blood pressure. Eliquis has been started. Will monitor on Eliquis for one day.  The patient has been discussed with [name]. He states that the apparent mass on MRI is much more likely a stroke. No further follow up with neurosurgery is necessary. The patient will follow up with stroke clinic on discharge.  Discharge to home was anticipated this morning after I visited this patient in his room. However, I received a message from nursing that the patient was now having difficulty answering questions that he had no difficulty with this morning. Repeat CT head was performed and demonstrated no significant change. The patient also had some changes on telemetry. EKG showed these to most likely be just another manifestation of early repolarization abnormality. SLP has re-evaluated the patient. They now recommend  inpatient rehab for the patient. The patient has now been re-evaluated by PT/OT. They have recommended CIR. PM&R was consulted and they feel that he is appropriate for CIR.  Consultants  . Neurosurgery . Neurology  Procedures  . None  Antibiotics   Anti-infectives (From admission, onward)   Start     Dose/Rate Route Frequency Ordered Stop   01/02/20 0000  ceFAZolin  (ANCEF) IVPB 2g/100 mL premix  Status:  Discontinued     2 g 200 mL/hr over 30 Minutes Intravenous 30 min pre-op 01/01/20 2039 01/02/20 1958   01/02/20 0000  vancomycin (VANCOCIN) IVPB 1000 mg/200 mL premix  Status:  Discontinued     1,000 mg 200 mL/hr over 60 Minutes Intravenous 60 min pre-op 01/01/20 2039 01/02/20 1958   12/30/19 1100  sulfamethoxazole-trimethoprim (BACTRIM) 400-80 MG per tablet 1 tablet     1 tablet Oral Every M-W-F 12/30/19 1009       Subjective  The patient is resting comfortably. No new complaints.  Objective   Vitals:  Vitals:   01/07/20 0454 01/07/20 1140  BP: (!) 161/70 115/62  Pulse: (!) 53 (!) 47  Resp: 18 16  Temp: 97.6 F (36.4 C) 98.1 F (36.7 C)  SpO2: 100% 100%   Exam:  Constitutional:  . The patient is awake, alert, and oriented x 3.  No acute distress.   Respiratory:  . No increased work of breathing. . No wheezes, rales, or rhonchi . No tactile fremitus Cardiovascular:  . Regular rate and rhythm . No murmurs, ectopy, or gallups. . No lateral PMI. No thrills. Abdomen:  . Abdomen is soft, non-tender, non-distended . No hernias, masses, or organomegaly . Normoactive bowel sounds.  Musculoskeletal:  . No cyanosis, clubbing, or edema Skin:  . No rashes, lesions, ulcers . palpation of skin: no induration or nodules Neurologic:  . CN 2-12 intact . Sensation all 4 extremities intact . Motor function is intact. Psychiatric:  . Mental status o Mood, affect appropriate o Orientation to person, place, time  . judgment and insight appear intact  I have personally reviewed the following:   Today's Data  . Vitals, BMP, CBC  Imaging  . MRI Brain 12/30/2019, 01/02/2020 . MRA Brain 01/03/2020 . Carotid ultrasound 01/03/2020 . Echocardiogram: pending . CT head without contrast: No evidence for frank hemorrhagic transformation.  Scheduled Meds: . apixaban  5 mg Oral BID  . atorvastatin  80 mg Oral q1800  . brimonidine  1 drop Right Eye  BID  . cinacalcet  60 mg Oral Daily  . dexamethasone (DECADRON) injection  8 mg Intravenous Q6H  . docusate sodium  100 mg Oral BID  . dorzolamide-timolol  1 drop Right Eye BID  . famotidine  20 mg Oral Daily  . finasteride  5 mg Oral Daily  . folic acid  1 mg Oral Daily  . hydrALAZINE  25 mg Oral Q6H  . insulin aspart  0-15 Units Subcutaneous TID WC  . insulin aspart  0-5 Units Subcutaneous QHS  . insulin glargine  20 Units Subcutaneous Daily  . magnesium oxide  400 mg Oral Daily  . metoprolol tartrate  12.5 mg Oral BID  . mycophenolate  360 mg Oral BID  . NIFEdipine  60 mg Oral Daily  . sulfamethoxazole-trimethoprim  1 tablet Oral Q M,W,F  . tacrolimus  2 mg Oral BID  . tamsulosin  0.4 mg Oral QPC breakfast   Principal Problem:   Brain tumor ([place]) Active Problems:   AKI (acute kidney  injury) [facility])   Kidney transplant recipient   Diabetes ([place])   Glaucoma   Hip pain, bilateral   Elevated PSA   Atrial fibrillation, chronic ([place])   Cerebral embolism with cerebral infarction   LOS: 8 days   A & P  Brain tumor: Patient with acute onset of confusion, expressive aphasia. 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 This is the source of his symptoms. Neurosurgery had planned to take the patient to the OR for a biopsy. However, as the patient was found to have new CVA on repeat MRI prior to planned biopsy, the brain biopsy was cancelled lieu of stroke work up. Neurology was consulted. [name] has evaluated the patient and feels that Brain Biopsy is no longer necessary. I have discussed the patient with [name]. He believes that the abnormality seen on MRI was infact a CVA. Stroke work up has been completed. The patient is found to be in atrial fibrillation. Initiation of anticoagulation has been discussed with [name]. As the petechial hemorrhages seen on MRI occurred more than a week ago, [name] recommended starting  Eliquis. Pharmacy has been consulted to manage the initiation of Eliquis.  Aniscoria: Likely due to brain mass. Unlikely to be due to CVA per neurology. Pt is on steroids to address vasogenic edema. Plan is for brain biopsy after stroke work up is completed.   Elevated PSA: Patient had planned to have prostate biopsy on 01/01/2020 at [facility]. Family is deciding if the want the patient to go ahead and have the procedure done here, or wait and have it done at [facility] as originally planned. The patient is also experiencing urinary incontinence. He will be continued on proscar and flomax. He is taking bactrim 3x a week for UTI prophylaxis.  AKI with h/o renal transplantation: Worsening. No recent labs since almost a year ago, but currently with significantly worsened creatinine/GFR. This may be associated with decreased PO intake in the setting of recent confusion. Creatinine is increasing. Will check a renal ultrasound. Continue IV fluids. Also will avoid nephrotoxic medications and hypotension. He will be continued on Sensipar. Voltaren gel will be held. He will be continued on prograf and myfortic. As creatinine is increasing, will consult nephrology.  DM II: Recent A1c 6.8, indicating good control. PO meds (Januvia, Gluocphage, Starlix, Actos) have been held. He will receive Lantus 14 units. His glucoses will be followed with FSBS and SSI. For the last 24 hours glucoses have run from 107 - 175.  HTN: Continue Nifedipine and Labetalol. Blood pressures under fair control.  Atrial fibrillation: Rate controlled with labetalol. Coumadin is on hold due to upcoming prostate biopsy/brain biopsy/petechial hemorrhages on MRI Brain. Monitor on telemetry.  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 He complains of pain along the left leg as well as gait instability that appears to be related to the L hip. For now, pain management as needed with eventual plan  for orthopedics consult as outpatient and likely to need THR.  Glaucoma: Continue Alphagan, Cosopt  HLD: Continue Lipitor  Hyperkalemia: Improved with IV fluids. Monitor.  I have seen and examined this patient myself. I have spent 32 minutes in his evaluation and care.  DVT prophylaxis:  SCDs Code Status:  Full - confirmed with patient/family Family Communication: No family available. Disposition Plan: Patient is from home. Plan is for discharge to SNF.  He is medically cleared. COVID has been ordered. Vern Guerette, DO [facility] Direct contact: see [email or web address]  7PM-7AM contact night coverage as above 01/07/2020, 1:56 PM  LOS: 1 day

## 2020-01-07 NOTE — TOC Progression Note (Signed)
Transition of Care [facility]) - Progression Note    Patient Details  Name: Ryan Rice MRN: [id number] Date of Birth: Mar 12, 1951  Transition of Care [facility]) CM/SW Contact  Pollie Friar, RN Phone Number: 01/07/2020, 1:35 PM  Clinical Narrative:    Recommendations have changed to rehab prior to d/c home. Wife is not able to provide 24 hour assistance/ supervision at home. Wife is agreeable to pt being faxed out for SNF rehab in the [facility] area. FL2 completed and information faxed to the facilities through the [place].  Pt will required new covid and insurance auth prior to SNF rehab.  TOC following.   Expected Discharge Plan: [place] Barriers to Discharge: No Barriers Identified  Expected Discharge Plan and Services Expected Discharge Plan: [place]   Discharge Planning Services: CM Consult Post Acute Care Choice: [place] arrangements for the past 2 months: Single Family Home Expected Discharge Date: 01/06/20                         HH Arranged: PT, OT HH Agency: [place] Date [facility] Agency Contacted: 12/31/19   Representative spoke with at [place]: Tommi Rumps aware of d/c home today.   Social Determinants of Health (SDOH) Interventions    Readmission Risk Interventions Readmission Risk Prevention Plan 12/31/2019  Transportation Screening Complete  HRI or Home Care Consult Complete  Palliative Care Screening Not Applicable  Medication Review (RN Care Manager) Referral to Pharmacy

## 2020-01-07 NOTE — Progress Notes (Signed)
Physical Therapy Treatment Patient Details Name: Ryan Rice MRN: [id number] DOB: January 15, 1951 Today's Date: 01/07/2020    History of Present Illness 69 y.o. male with medical history significant of PVD; renal transplant (2013); HTN; DM; and afib presenting with AMS. Pt reporting some expressive aphasia, tremors, urinary incontinence. Pt also reports L hip and leg pain for a few months. 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 Treatment in place for infarct vs neoplasm    PT Comments    Patient seen for continued gait and balance training. Continue to recommend CIR for further skilled PT services to maximize independence and safety with mobility.     Follow Up Recommendations  Supervision/Assistance - 24 hour;CIR     Equipment Recommendations  None recommended by PT    Recommendations for Other Services       Precautions / Restrictions Precautions Precautions: Fall Restrictions Weight Bearing Restrictions: No    Mobility  Bed Mobility Overal bed mobility: Needs Assistance             General bed mobility comments: supervision for safety due to pt's impulsivity however no physical assist or cues required   Transfers Overall transfer level: Needs assistance Equipment used: 1 person hand held assist Transfers: Sit to/from Stand Sit to Stand: Min assist         General transfer comment: assistance required for balance and safety; pt reaching for something to hold onto upon standing  Ambulation/Gait Ambulation/Gait assistance: Min assist;Mod assist Gait Distance (Feet): 100 Feet Assistive device: 1 person hand held assist(assist at trunk with gait belt) Gait Pattern/deviations: Trunk flexed;Step-through pattern;Decreased stride length;Drifts right/left Gait velocity: varying     General Gait Details: assistance required for balance and cues for safety and navigating environment required; pt incontinent of urine while ambulating    Stairs             Wheelchair Mobility    Modified Rankin (Stroke Patients Only) Modified Rankin (Stroke Patients Only) Modified Rankin: Moderately severe disability     Balance Overall balance assessment: Needs assistance Sitting-balance support: No upper extremity supported;Feet supported Sitting balance-Leahy Scale: Good     Standing balance support: Single extremity supported;During functional activity;Bilateral upper extremity supported Standing balance-Leahy Scale: Poor                              Cognition Arousal/Alertness: Awake/alert Behavior During Therapy: Impulsive;Flat affect Overall Cognitive Status: Impaired/Different from baseline Area of Impairment: Attention;Memory;Following commands;Safety/judgement;Problem solving;Orientation                 Orientation Level: Time;Situation Current Attention Level: Sustained Memory: Decreased short-term memory Following Commands: Follows one step commands inconsistently Safety/Judgement: Decreased awareness of safety;Decreased awareness of deficits   Problem Solving: Requires tactile cues;Difficulty sequencing;Requires verbal cues        Exercises      General Comments        Pertinent Vitals/Pain Pain Assessment: No/denies pain    Home Living                      Prior Function            PT Goals (current goals can now be found in the care plan section) Acute Rehab PT Goals Patient Stated Goal: To go home  Progress towards PT goals: Progressing toward goals    Frequency    Min 3X/week      PT Plan Current plan remains appropriate    Co-evaluation              AM-PAC PT "6 Clicks" Mobility   Outcome Measure  Help needed turning from your back to your side while in a flat bed without using  bedrails?: None Help needed moving from lying on your back to sitting on the side of a flat bed without using bedrails?: None Help needed moving to and from a bed to a chair (including a wheelchair)?: A [place] Help needed standing up from a chair using your arms (e.g., wheelchair or bedside chair)?: A [place] Help needed to walk in hospital room?: A Lot Help needed climbing 3-5 steps with a railing? : A Lot 6 Click Score: 18    End of Session Equipment Utilized During Treatment: Gait belt Activity Tolerance: Patient tolerated treatment well Patient left: with call bell/phone within reach;in bed;with bed alarm set Nurse Communication: Mobility status PT Visit Diagnosis: Other abnormalities of gait and mobility (R26.89);Other symptoms and signs involving the nervous system (R29.898)     Time: [id number] PT Time Calculation (min) (ACUTE ONLY): 27 min  Charges:  $Gait Training: 23-37 mins                     [name], PTA Acute Rehabilitation Services Pager: [phone] Office: [phone]     [name] 01/07/2020, 4:54 PM

## 2020-01-07 NOTE — Progress Notes (Signed)
Pr telemetry, pt had 2.27 pause HR 61 at 0635. Opyd updated.

## 2020-01-07 NOTE — Progress Notes (Signed)
ANTICOAGULATION CONSULT NOTE - Initial Consult  Pharmacy Consult for Apixaban Indication: Afib + CVA  Allergies  Allergen Reactions  . Iodinated Diagnostic Agents Hives  . Tape Hives    Patient Measurements: Height: 5\' 11"  (180.3 cm) Weight: 86.6 kg (191 lb) IBW/kg (Calculated) : 75.3  Vital Signs: Temp: 97.6 F (36.4 C) (04/13 0454) Temp Source: Oral (04/13 0454) BP: 161/70 (04/13 0454) Pulse Rate: 53 (04/13 0454)  Labs: Recent Labs    01/06/20 0337 01/06/20 1407 01/06/20 1513 01/07/20 0330  HGB 14.3  --   --  14.3  HCT 43.8  --   --  44.1  PLT 182  --   --  174  CREATININE 1.56*  --   --  1.19  1.18  TROPONINIHS  --  30* 25*  --     Estimated Creatinine Clearance: 62.9 mL/min (by C-G formula based on SCr of 1.18 mg/dL).   Medical History: Past Medical History:  Diagnosis Date  . Allergic rhinitis   . BPH (benign prostatic hyperplasia)   . Chronic atrial fibrillation ([place])   . Diabetes mellitus ([place])   . DVT (deep venous thrombosis) ([facility])    LLE  . Hematochezia   . HTN (hypertension)   . Hypomagnesemia 03/20/2019  . Kidney transplant recipient   . PVD (peripheral vascular disease) ([facility])     Assessment: 21 YOM on warfarin PTA for hx Afib - held PTA due to need for prostate biopsy, then admitted with AMS and thought to have a brain mass. Upon further review and evaluation - it is thought to represent infarcts with petechia hemorrhages and the stroke team was consulted. The hemorrhagic conversions have remained stable and pharmacy has been consulted to transition to Apixaban for anticoagulation.   Goal of Therapy:  Appropriate anticoagulation for indication and hepatic/renal function  Monitor platelets by anticoagulation protocol: Yes   Plan:  - Continue Apixaban 5 mg bid - Will continue to monitor for any signs/symptoms of bleeding and will follow along with neuro for evaluation of stability of hemorrhagic conversions  -Pharmacy will sign off  Artha Chiasson  A. Levada Dy, PharmD, BCPS, FNKF Clinical Pharmacist [place] Please utilize Amion for appropriate phone number to reach the unit pharmacist ([place])

## 2020-01-07 NOTE — Progress Notes (Signed)
Inpatient Rehab Admissions:  Inpatient Rehab Consult received.  I met with pt at the bedside for rehabilitation assessment and to discuss goals and expectations of an inpatient rehab admission. Pt appears to be a good CIR rehab candidate. However, noted in CM note that pt does not have 24/7 supervision.  Called wife with pt's permission. Left message and awaiting return call to confirm family availability at d/c. Will f/u tomorrow.  If pt does not have 24/7 supervision, he will need SNF placement.   Signed: Gayland Curry, [place], [place] Admissions Coordinator [phone]

## 2020-01-07 NOTE — NC FL2 (Signed)
[place] LEVEL OF CARE SCREENING TOOL     IDENTIFICATION  Patient Name: Ryan Rice Birthdate: 23-May-1951 Sex: male Admission Date (Current Location): 12/29/2019  [facility] and [place] Number:  Herbalist and Address:  [facility], [place] [address], [place],  [address]      Provider Number: [id number]  Attending Physician Name and Address:  Karie Kirks, DO  Relative Name and Phone Number:       Current Level of Care: Hospital Recommended Level of Care: [place] Prior Approval Number:    Date Approved/Denied:   PASRR Number: [id number]  Discharge Plan: SNF    Current Diagnoses: Patient Active Problem List   Diagnosis Date Noted  . Cerebral embolism with cerebral infarction 01/03/2020  . Brain tumor ([place]) 12/30/2019  . Hip pain, bilateral 12/30/2019  . Elevated PSA 12/30/2019  . Atrial fibrillation, chronic ([place]) 12/30/2019  . Hypomagnesemia 03/20/2019  . Diabetes ([place]) 03/15/2019  . Glaucoma 03/15/2019  . Scleral hemorrhage of left eye 03/15/2019  . Gout flare 03/15/2019  . Debility 03/08/2019  . AKI (acute kidney injury) ([place])   . CAP (community acquired pneumonia)   . Kidney transplant recipient   . Sepsis ([place]) 02/27/2019    Orientation RESPIRATION BLADDER Height & Weight     Self, Place  Normal Incontinent Weight: 86.6 kg Height:  5\' 11"  (180.3 cm)  BEHAVIORAL SYMPTOMS/MOOD NEUROLOGICAL BOWEL NUTRITION STATUS      Incontinent Diet(heart healthy/ carb modified with thin liquids)  AMBULATORY STATUS COMMUNICATION OF NEEDS Skin   Extensive Assist Verbally Normal                       Personal Care Assistance Level of Assistance  Bathing, Feeding, Dressing Bathing Assistance: Limited assistance Feeding assistance: Independent Dressing Assistance: Limited assistance     Functional Limitations Info  Sight, Hearing, Speech Sight Info: Impaired Hearing Info: Adequate Speech  Info: Impaired(expressive aphasia)    SPECIAL CARE FACTORS FREQUENCY  PT (By licensed PT), OT (By licensed OT), Speech therapy     PT Frequency: 5x/wk OT Frequency: 5x/wk     Speech Therapy Frequency: 5x/wk      Contractures Contractures Info: Not present    Additional Factors Info  Code Status, Allergies, Insulin Sliding Scale Code Status Info: Full Allergies Info: iodinated diagnostic agents/ tape   Insulin Sliding Scale Info: Novolog 0-15 units SQ three times a day/ Novolog 0-5 units SQ at night/ Lantus 20 units SQ daily       Current Medications (01/07/2020):  This is the current hospital active medication list Current Facility-Administered Medications  Medication Dose Route Frequency Provider Last Rate Last Admin  . acetaminophen (TYLENOL) tablet 650 mg  650 mg Oral Q6H PRN Karmen Bongo, MD       Or  . acetaminophen (TYLENOL) suppository 650 mg  650 mg Rectal Q6H PRN Karmen Bongo, MD      . apixaban Arne Cleveland) tablet 5 mg  5 mg Oral BID Rolla Flatten, RPH   5 mg at 01/07/20 [id number]  . artificial tears (LACRILUBE) ophthalmic ointment   Left Eye QHS PRN Karmen Bongo, MD      . atorvastatin (LIPITOR) tablet 80 mg  80 mg Oral q1800 Rinehuls, David L, PA-C   80 mg at 01/06/20 1821  . bisacodyl (DULCOLAX) EC tablet 5 mg  5 mg Oral Daily PRN Karmen Bongo, MD      . brimonidine (ALPHAGAN) 0.2 %  ophthalmic solution 1 drop  1 drop Right Eye BID Karmen Bongo, MD   1 drop at 01/07/20 0909  . cinacalcet (SENSIPAR) tablet 60 mg  60 mg Oral Daily Karmen Bongo, MD   60 mg at 01/07/20 0908  . dexamethasone (DECADRON) injection 8 mg  8 mg Intravenous Q6H Karmen Bongo, MD   8 mg at 01/07/20 1316  . docusate sodium (COLACE) capsule 100 mg  100 mg Oral BID Karmen Bongo, MD   100 mg at 01/07/20 0908  . dorzolamide-timolol (COSOPT) 22.3-6.8 MG/ML ophthalmic solution 1 drop  1 drop Right Eye BID Karmen Bongo, MD   1 drop at 01/07/20 0909  . famotidine (PEPCID) tablet  20 mg  20 mg Oral Daily Karmen Bongo, MD   20 mg at 01/07/20 0908  . finasteride (PROSCAR) tablet 5 mg  5 mg Oral Daily Karmen Bongo, MD   5 mg at 01/07/20 0908  . folic acid (FOLVITE) tablet 1 mg  1 mg Oral Daily Karmen Bongo, MD   1 mg at 01/07/20 0909  . hydrALAZINE (APRESOLINE) tablet 25 mg  25 mg Oral Q6H Swayze, Ava, DO   25 mg at 01/07/20 1315  . HYDROcodone-acetaminophen (NORCO/VICODIN) 5-325 MG per tablet 1-2 tablet  1-2 tablet Oral Q4H PRN Karmen Bongo, MD      . insulin aspart (novoLOG) injection 0-15 Units  0-15 Units Subcutaneous TID WC Karmen Bongo, MD   5 Units at 01/07/20 1320  . insulin aspart (novoLOG) injection 0-5 Units  0-5 Units Subcutaneous QHS Karmen Bongo, MD   2 Units at 01/05/20 2220  . insulin glargine (LANTUS) injection 20 Units  20 Units Subcutaneous Daily Swayze, Ava, DO   20 Units at 01/07/20 0907  . magnesium oxide (MAG-OX) tablet 400 mg  400 mg Oral Daily Karmen Bongo, MD   400 mg at 01/07/20 0908  . metoprolol tartrate (LOPRESSOR) tablet 12.5 mg  12.5 mg Oral BID Swayze, Ava, DO   12.5 mg at 01/07/20 0908  . morphine 2 MG/ML injection 2 mg  2 mg Intravenous Q2H PRN Karmen Bongo, MD      . mycophenolate (MYFORTIC) EC tablet 360 mg  360 mg Oral BID Karmen Bongo, MD   360 mg at 01/07/20 0908  . NIFEdipine (PROCARDIA XL/NIFEDICAL XL) 24 hr tablet 60 mg  60 mg Oral Daily Karmen Bongo, MD   60 mg at 01/07/20 0909  . ondansetron (ZOFRAN) tablet 4 mg  4 mg Oral Q6H PRN Karmen Bongo, MD       Or  . ondansetron [facility]) injection 4 mg  4 mg Intravenous Q6H PRN Karmen Bongo, MD      . polyethylene glycol (MIRALAX / GLYCOLAX) packet 17 g  17 g Oral Daily PRN Karmen Bongo, MD      . sulfamethoxazole-trimethoprim (BACTRIM) 400-80 MG per tablet 1 tablet  1 tablet Oral Q M,W,F Karmen Bongo, MD   1 tablet at 01/06/20 1109  . tacrolimus (PROGRAF) capsule 2 mg  2 mg Oral BID Karmen Bongo, MD   2 mg at 01/07/20 0908  . tamsulosin (FLOMAX)  capsule 0.4 mg  0.4 mg Oral QPC breakfast Karmen Bongo, MD   0.4 mg at 01/07/20 0908  . zolpidem (AMBIEN) tablet 5 mg  5 mg Oral QHS PRN Karmen Bongo, MD         Discharge Medications: Please see discharge summary for a list of discharge medications.  Relevant Imaging Results:  Relevant Lab Results:   Additional Information SS#: [id number]  Pollie Friar, RN

## 2020-01-08 DIAGNOSIS — D496 Neoplasm of unspecified behavior of brain: Secondary | ICD-10-CM | POA: Diagnosis not present

## 2020-01-08 LAB — BASIC METABOLIC PANEL
Anion gap: 8 (ref 5–15)
BUN: 52 mg/dL — ABNORMAL HIGH (ref 8–23)
CO2: 21 mmol/L — ABNORMAL LOW (ref 22–32)
Calcium: 8.9 mg/dL (ref 8.9–10.3)
Chloride: 102 mmol/L (ref 98–111)
Creatinine, Ser: 1.26 mg/dL — ABNORMAL HIGH (ref 0.61–1.24)
GFR calc Af Amer: >60 mL/min (ref >60)
GFR calc non Af Amer: 58 mL/min — ABNORMAL LOW (ref >60)
Glucose, Bld: 226 mg/dL — ABNORMAL HIGH (ref 70–99)
Potassium: 4.9 mmol/L (ref 3.5–5.1)
Sodium: 131 mmol/L — ABNORMAL LOW (ref 135–145)

## 2020-01-08 LAB — GLUCOSE, CAPILLARY
Glucose-Capillary: 212 mg/dL — ABNORMAL HIGH (ref 70–99)
Glucose-Capillary: 222 mg/dL — ABNORMAL HIGH (ref 70–99)
Glucose-Capillary: 241 mg/dL — ABNORMAL HIGH (ref 70–99)
Glucose-Capillary: 244 mg/dL — ABNORMAL HIGH (ref 70–99)

## 2020-01-08 LAB — MAGNESIUM: Magnesium: 1.8 mg/dL (ref 1.7–2.4)

## 2020-01-08 LAB — TACROLIMUS LEVEL: Tacrolimus (FK506) - LabCorp: 9.7 ng/mL (ref 2.0–20.0)

## 2020-01-08 MED ORDER — PREDNISONE 20 MG PO TABS: 40.0000 mg | ORAL_TABLET | Freq: Every day | ORAL | Status: DC

## 2020-01-08 MED ORDER — PREDNISONE 5 MG PO TABS: 10.0000 mg | ORAL_TABLET | Freq: Every day | ORAL | Status: DC

## 2020-01-08 MED ORDER — PREDNISONE 20 MG PO TABS: 20.0000 mg | ORAL_TABLET | Freq: Every day | ORAL | Status: DC

## 2020-01-08 MED ORDER — PREDNISONE 20 MG PO TABS
60.0000 mg | ORAL_TABLET | Freq: Every day | ORAL | Status: DC
  Administered 2020-01-09 – 2020-01-10 (×2): 60 mg via ORAL
  Filled 2020-01-08 (×2): qty 3

## 2020-01-08 MED ORDER — PREDNISONE 5 MG PO TABS: 30.0000 mg | ORAL_TABLET | Freq: Every day | ORAL | Status: DC

## 2020-01-08 MED ORDER — PREDNISONE 50 MG PO TABS: 50.0000 mg | ORAL_TABLET | Freq: Every day | ORAL | Status: DC

## 2020-01-08 NOTE — PMR Pre-admission (Addendum)
PMR Admission Coordinator Pre-Admission Assessment  Patient: Ryan Rice is an 69 y.o., male MRN: [id number] DOB: 09-19-1951 Height: 5' 11" (180.3 cm) Weight: 86.6 kg              Insurance Information HMO: yes    PPO:      PCP:      IPA:      80/20:      OTHER:  PRIMARY: [employer] Medicare      Policy#: [id number]      Subscriber: patient CM Name: Lattie Haw       Phone#: [phone]     Fax#: [phone] Pre-Cert#: [id number]       Employer:  Josem Kaufmann provided by Lattie Haw with [facility] Medicare for admit to CIR with start date 4/15. Auth is good for 5 days to admit. Call once admitted if start date different. Follow up CM is [employer]. Fax weekly updates to (F): [phone] (P): [phone] Benefits:  Phone #: online via [email or web address]     Name:  Eff. Date: 09/27/2019-09/25/2020     Deduct: NA for in-network providers      Out of Pocket Max: $3,900 ($385.67 met)      Life Max:   CIR: $295/day for days 1-6, $0/day for days 7-90      SNF: $0/day for days 1-20, $184/day for days 21-100; limited to 100 days/calendar year Outpatient: $10-$40/visit pending service; visits limited by medical necessity     Co-Pay:  Home Health: 100% coverage, 0% co-insurance; limited by medical necessity      Co-Pay:  DME: 80% coverage, 20% co-insurance     Co-Pay:  Providers:  SECONDARY: none       Financial Counselor:       Phone#:   The Therapist, art Information Summary" for patients in Inpatient Rehabilitation Facilities with attached "Privacy Act [place] Records" was provided and verbally reviewed with: Family  Emergency Contact Information Contact Information    Name Relation Home Work Mobile   Stegmann,BEVERLY Spouse   [phone]     Current Medical History  Patient Admitting Diagnosis: L ACA territory infarct History of Present Illness: Pt is a 69 y.o.male with history of PVD, renal transplant, CAF, BPH, HTN, who was admitted on 12/30/19 with staring episodes, decrease in verbal  output and stuttering.  Patient on chronic coumadin and had been off coumadin in anticipation of GU procedure.  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 EEG without seizure activity and Dr.Cabbell consulted with plans for biopsy on 04/08.  for input on biopsy. Follow up MRI brain on 04/08 showed stable anterior left frontal lobe with hyperintense flare suggesting blood products and new confluent areas of diffusion abnormality left hemisphere gyriform with small areas of chronic encephalomalacia question subacute infarct with petechial hemorrhage or venous infarcts.  Surgery cancelled and  [name] consulted for input. He felt that patient with L-ACA infarcts with hemorrhagic conversion as well as L-MCA infarcts--embolic due to A fib and lack of coumadin. He did not feel that brain biopsy was indicated.   Follow up CT head showed stable evolving L-ACA and left temporoparietal and occipital infarcts. MRV brain was negative for venous sinus thrombosis with hypoplastic left transverse sinus.    2D echo showed EF 55-60% and bilateral atrial dilatation. Carotid dopplers without significant ICA stenosis.  Nephrology consulted for acute on chronic renal failure with hyperkalemia and he was started on IVF for hydration and renal ultrasound  ordered for work up. Therapy ongoing  revealing expressive and receptive language deficits with verbal perseveration and phonemic paraphasias, left visual field deficits  and antalgic gait with poor safety awareness. CIR recommended due to functional deficits.     Complete NIHSS TOTAL: 2 Glasgow Coma Scale Score: 15  Past Medical History  Past Medical History:  Diagnosis Date  . Allergic rhinitis   . BPH (benign prostatic hyperplasia)   . Chronic atrial fibrillation ([place])   . Diabetes mellitus ([place])   . DVT (deep venous thrombosis) ([facility])    LLE  . Hematochezia   . HTN (hypertension)   .  Hypomagnesemia 03/20/2019  . Kidney transplant recipient   . PVD (peripheral vascular disease) ([place])     Family History  family history includes Diabetes in his brother, father, and sister; Heart disease in his brother and father; Stroke in his mother.  Prior Rehab/Hospitalizations:  Has the patient had prior rehab or hospitalizations prior to admission? Yes  Has the patient had major surgery during 100 days prior to admission? No  Current Medications   Current Facility-Administered Medications:  .  0.9 %  sodium chloride infusion, , Intravenous, Continuous, Shawna Clamp, MD, Last Rate: 100 mL/hr at 01/10/20 0807, New Bag at 01/10/20 0807 .  acetaminophen (TYLENOL) tablet 650 mg, 650 mg, Oral, Q6H PRN **OR** acetaminophen (TYLENOL) suppository 650 mg, 650 mg, Rectal, Q6H PRN, Karmen Bongo, MD .  apixaban Arne Cleveland) tablet 5 mg, 5 mg, Oral, BID, Rolla Flatten, RPH, 5 mg at 01/10/20 0805 .  artificial tears (LACRILUBE) ophthalmic ointment, , Left Eye, QHS PRN, Karmen Bongo, MD .  atorvastatin (LIPITOR) tablet 80 mg, 80 mg, Oral, q1800, Rinehuls, David L, PA-C, 80 mg at 01/09/20 1752 .  bisacodyl (DULCOLAX) EC tablet 5 mg, 5 mg, Oral, Daily PRN, Karmen Bongo, MD .  brimonidine (ALPHAGAN) 0.2 % ophthalmic solution 1 drop, 1 drop, Right Eye, BID, Karmen Bongo, MD, 1 drop at 01/10/20 [phone]  cinacalcet (SENSIPAR) tablet 60 mg, 60 mg, Oral, Daily, Karmen Bongo, MD, 60 mg at 01/10/20 0905 .  docusate sodium (COLACE) capsule 100 mg, 100 mg, Oral, BID, Karmen Bongo, MD, 100 mg at 01/10/20 0805 .  dorzolamide-timolol (COSOPT) 22.3-6.8 MG/ML ophthalmic solution 1 drop, 1 drop, Right Eye, BID, Karmen Bongo, MD, 1 drop at 01/10/20 0811 .  famotidine (PEPCID) tablet 20 mg, 20 mg, Oral, Daily, Karmen Bongo, MD, 20 mg at 01/10/20 0806 .  finasteride (PROSCAR) tablet 5 mg, 5 mg, Oral, Daily, Karmen Bongo, MD, 5 mg at 01/10/20 0805 .  folic acid (FOLVITE) tablet 1 mg, 1 mg,  Oral, Daily, Karmen Bongo, MD, 1 mg at 01/10/20 0806 .  hydrALAZINE (APRESOLINE) tablet 25 mg, 25 mg, Oral, Q6H, Swayze, Ava, DO, 25 mg at 01/10/20 0509 .  HYDROcodone-acetaminophen (NORCO/VICODIN) 5-325 MG per tablet 1-2 tablet, 1-2 tablet, Oral, Q4H PRN, Karmen Bongo, MD .  insulin aspart (novoLOG) injection 0-15 Units, 0-15 Units, Subcutaneous, TID WC, Karmen Bongo, MD, 3 Units at 01/10/20 [phone] .  insulin aspart (novoLOG) injection 0-5 Units, 0-5 Units, Subcutaneous, QHS, Karmen Bongo, MD, 2 Units at 01/08/20 2159 .  insulin glargine (LANTUS) injection 20 Units, 20 Units, Subcutaneous, Daily, Swayze, Ava, DO, 20 Units at 01/10/20 0905 .  magnesium oxide (MAG-OX) tablet 400 mg, 400 mg, Oral, Daily, Karmen Bongo, MD, 400 mg at 01/10/20 0806 .  metoprolol tartrate (LOPRESSOR) tablet 12.5 mg, 12.5 mg, Oral, BID, Opyd, Ilene Qua, MD, 12.5 mg at 01/10/20 0806 .  morphine  2 MG/ML injection 2 mg, 2 mg, Intravenous, Q2H PRN, Karmen Bongo, MD .  mycophenolate (MYFORTIC) EC tablet 360 mg, 360 mg, Oral, BID, Karmen Bongo, MD, 360 mg at 01/10/20 0905 .  NIFEdipine (PROCARDIA XL/NIFEDICAL XL) 24 hr tablet 60 mg, 60 mg, Oral, Daily, Karmen Bongo, MD, 60 mg at 01/10/20 0905 .  ondansetron (ZOFRAN) tablet 4 mg, 4 mg, Oral, Q6H PRN **OR** ondansetron (ZOFRAN) injection 4 mg, 4 mg, Intravenous, Q6H PRN, Karmen Bongo, MD .  polyethylene glycol (MIRALAX / GLYCOLAX) packet 17 g, 17 g, Oral, Daily PRN, Karmen Bongo, MD .  Derrill Memo ON 01/24/2020] predniSONE (DELTASONE) tablet 10 mg, 10 mg, Oral, Q breakfast, Swayze, Ava, DO .  [START ON 01/21/2020] predniSONE (DELTASONE) tablet 20 mg, 20 mg, Oral, Q breakfast, Swayze, Ava, DO .  [START ON 01/18/2020] predniSONE (DELTASONE) tablet 30 mg, 30 mg, Oral, Q breakfast, Swayze, Ava, DO .  [START ON 01/15/2020] predniSONE (DELTASONE) tablet 40 mg, 40 mg, Oral, Q breakfast, Swayze, Ava, DO .  [START ON 01/12/2020] predniSONE (DELTASONE) tablet 50 mg, 50 mg,  Oral, Q breakfast, Swayze, Ava, DO .  predniSONE (DELTASONE) tablet 60 mg, 60 mg, Oral, Q breakfast, Swayze, Ava, DO, 60 mg at 01/10/20 0805 .  sulfamethoxazole-trimethoprim (BACTRIM) 400-80 MG per tablet 1 tablet, 1 tablet, Oral, Q M,W,F, Karmen Bongo, MD, 1 tablet at 01/10/20 0905 .  tacrolimus (PROGRAF) capsule 2 mg, 2 mg, Oral, BID, Karmen Bongo, MD, 2 mg at 01/10/20 0905 .  tamsulosin (FLOMAX) capsule 0.4 mg, 0.4 mg, Oral, QPC breakfast, Karmen Bongo, MD, 0.4 mg at 01/10/20 8242 .  zolpidem (AMBIEN) tablet 5 mg, 5 mg, Oral, QHS PRN, Karmen Bongo, MD  Patients Current Diet:  Diet Order            Diet - low sodium heart healthy        Diet Carb Modified        Diet heart healthy/carb modified Room service appropriate? 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  Independent  Indoor Mobility: Did the patient need assistance with walking from room to room (with or without device)? Independent  Stairs: Did the patient need assistance with internal or external stairs (with or without device)? Independent  Functional Cognition: Did the patient need help planning regular tasks such as shopping or remembering to take medications? Independent  Home Assistive Devices / Equipment Home Equipment: Walker - 2 wheels  Prior Device Use: Indicate devices/aids used by the patient prior to current illness, exacerbation or injury? None of the above  Current Functional Level Cognition  Arousal/Alertness:  Awake/alert Overall Cognitive Status: Impaired/Different from baseline Difficult to assess due to: (aphasia) Current Attention Level: Sustained Orientation Level: Disoriented to situation, Disoriented to time, Disoriented to place, Oriented to person Following Commands: Follows one step commands inconsistently, Follows one step commands with increased time Safety/Judgement: Decreased awareness of safety, Decreased awareness of deficits General Comments: pt unable to state name and with increased word salad this session; perseverating Attention: Sustained Sustained Attention: Impaired Sustained  Attention Impairment: Verbal complex Memory: Impaired Memory Impairment: Decreased short term memory Decreased Short Term Memory: Verbal basic Awareness: Impaired Awareness Impairment: Intellectual impairment Problem Solving: Impaired Problem Solving Impairment: Verbal complex, Verbal basic Executive Function: Reasoning Reasoning: Impaired Reasoning Impairment: Verbal complex Behaviors: Perseveration Safety/Judgment: Impaired    Extremity Assessment (includes Sensation/Coordination)  Upper Extremity Assessment: Generalized weakness RUE Deficits / Details: WFL AROM with tremor RUE Coordination: (slow and deliberate finger to nose w/ mild intention tremor) LUE Coordination: (slow and deliberate finger to nose w/ mild intention tremor)  Lower Extremity Assessment: Defer to PT evaluation    ADLs  Overall ADL's : Needs assistance/impaired Eating/Feeding: Set up, Bed level Grooming: Cueing for sequencing, Sitting, Standing Grooming Details (indicate cue type and reason): Completed in standing, no LOB, but ataxic movement noted throughout Upper Body Bathing: Minimal assistance Lower Body Bathing: Moderate assistance, Sit to/from stand Lower Body Bathing Details (indicate cue type and reason): Completed in standing at sink, min A for safety and balance Upper Body Dressing : Minimal  assistance Lower Body Dressing: Moderate assistance, Sit to/from stand Toilet Transfer: Min guard, Cueing for safety, Cueing for sequencing Toilet Transfer Details (indicate cue type and reason): Simulated with transfer to recliner after functional ambulation Toileting- Clothing Manipulation and Hygiene: Minimal assistance Toileting - Clothing Manipulation Details (indicate cue type and reason): apparently had to urinate and pulled the trashcan next to him to urinate in rather than notifying therapist or asking to go to the bathroom. Pt given urinal and used appropriately Functional mobility during ADLs: Minimal assistance(HHA) General ADL Comments: Pt attempting to wash face    Mobility  Overal bed mobility: Needs Assistance Bed Mobility: Supine to Sit Supine to sit: Min assist General bed mobility comments: multimodal cues and assist to bring bilat LE to EOB and to elevate trunk into sitting     Transfers  Overall transfer level: Needs assistance Equipment used: 1 person hand held assist Transfers: Sit to/from Stand Sit to Stand: Min assist General transfer comment: assist to power up and to steady; pt stood several times due to bowel incontinence X 2 during session and standing for hygiene    Ambulation / Gait / Stairs / Wheelchair Mobility  Ambulation/Gait Ambulation/Gait assistance: Min assist, Mod assist Gait Distance (Feet): 100 Feet Assistive device: 1 person hand held assist, Rolling walker (2 wheeled) Gait Pattern/deviations: Trunk flexed, Step-through pattern, Decreased stride length, Drifts right/left, Antalgic, Decreased stance time - left, Decreased step length - right, Wide base of support General Gait Details: initially HHA however pt requiring increased assistance for balance and with very flexed posture; pt able to correct posture briefly with cues; assistance for balance and for managing RW as pt tends to keep RW too far away  Gait velocity: varying  Gait velocity  interpretation: <1.8 ft/sec, indicate of risk for recurrent falls Stairs: Yes Stairs assistance: Min assist Stair Management: Two rails, Alternating pattern, Forwards Number of Stairs: (3 steps then 2 shorter steps) General stair comments: cues for safety and step to pattern; pt alternating pattern despite cues    Posture / Balance Balance Overall balance assessment: Needs assistance Sitting-balance support: No upper extremity supported, Feet supported Sitting balance-Leahy Scale: Good Standing balance support: Single extremity supported, During functional activity, Bilateral upper extremity supported Standing balance-Leahy Scale: Poor Standing balance comment: HHA used    Special needs/care consideration Continuous Drip IV  0.9% sodium chloride infusion , Skin abrasion to med/lower scrotum, Diabetic management yes and Designated visitor  wife: [name]  Pt is incontinent bowel and bladder. Last BM: 4/15     Previous Home Environment (from acute therapy documentation) Living Arrangements: Spouse/significant other  Lives With: Spouse, Family(grandaughter lives with them) Available Help at Discharge: Family, Available 24 hours/day Type of Home: House Home Layout: One level Home Access: Stairs to enter Entrance Stairs-Rails: None Entrance Stairs-Number of Steps: 1 Bathroom Shower/Tub: Optometrist: Yes How Accessible: Accessible via walker [place]: No Additional Comments: no DME  Discharge Living Setting Plans for Discharge Living Setting: Patient's home Type of Home at Discharge: House Discharge Home Layout: One level Discharge Home Access: Stairs to enter Entrance Stairs-Rails: None Entrance Stairs-Number of Steps: 1 Discharge Bathroom Shower/Tub: Tub/shower unit Discharge Bathroom Toilet: Standard Discharge Bathroom Accessibility: Yes How Accessible: Accessible via walker Does the patient have any problems  obtaining your medications?: Yes (Describe)(kidney transplant medication)  Social/Family/Support Systems Patient Roles: Spouse, Caregiver Contact Information: see below for [employer] Anticipated Caregiver: Elpidio Anis, wife and son/daughter-in-law Anticipated Caregiver's Contact Information: [phone] Ability/Limitations of Caregiver: Min A Caregiver Availability: 24/7 Discharge Plan Discussed with Primary Caregiver: Yes Is Caregiver In Agreement with Plan?: Yes Does Caregiver/Family have Issues with Lodging/Transportation while Pt is in Rehab?: No   Goals Patient/Family Goal for Rehab: Supervison-Min A:PT/OT/ST Expected length of stay: 12-15 days Pt/Family Agrees to Admission and willing to participate: Yes Program Orientation Provided & Reviewed with Pt/Caregiver Including Roles  & Responsibilities: Yes  Barriers to Discharge: Home environment access/layout  Barriers to Discharge Comments: steps to enter home.    Decrease burden of Care through IP rehab admission: NA   Possible need for SNF placement upon discharge: Not anticipated d/t acceptable supervision after d/c and anticipated achievement of CIR goals. Family aware that plan is to DC home with support at end of CIR program.    Patient Condition: This patient's medical and functional status has changed since the consult dated 01/07/20 in which the Rehabilitation Physician determined and documented that the patient was potentially appropriate for intensive rehabilitative care in an inpatient rehabilitation facility. Issues have been addressed and update has been discussed with [name] and patient now appropriate for inpatient rehabilitation as he now has a stable, confirmed dispo from family. Will admit to inpatient rehab today.   Preadmission Screen Completed By: Scherrie Gerlach with day of admit updates provided by:   Raechel Ache, OT, 01/10/2020 11:30  AM ______________________________________________________________________   Discussed status with [name] on 01/10/20 at 11:32AM and received approval for admission today.  Admission Coordinator:  Raechel Ache, time 11:32AM/Date 01/10/20

## 2020-01-08 NOTE — Progress Notes (Signed)
Will begin insurance authorization process for potential admission to CIR.  Gayland Curry, [place], [place] Admissions Coordinator [phone]

## 2020-01-08 NOTE — Progress Notes (Signed)
After signing off on pt, wife called this morning.  She explained that family is now able to provide necessary support for pt after d/c.  She wants to pursue CIR.  Asked MD to place consult order.  No beds are available today.. Will f/u tomorrow.  Gayland Curry, [place], [place] Admissions Coordinator [phone]

## 2020-01-08 NOTE — Progress Notes (Signed)
Occupational Therapy Treatment Note  Pt perseverating and has difficulty with sequencing during functional tasks requiring increased VC. Hand held min A for ambulation during functional mobility. Feel pt would benefit from rehab at CIR to maximize functional level of independence. Will continue to follow acutely.    01/08/20 1000  OT Visit Information  Last OT Received On 01/08/20  Assistance Needed +1  History of Present Illness 69 y.o. male with medical history significant of PVD; renal transplant (2013); HTN; DM; and afib presenting with AMS. Pt reporting some expressive aphasia, tremors, urinary incontinence. Pt also reports L hip and leg pain for a few months. 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 Treatment in place for infarct vs neoplasm  Precautions  Precautions Fall  Pain Assessment  Pain Assessment Faces  Faces Pain Scale 4  Pain Location L hip with mobility  Pain Descriptors / Indicators Aching;Guarding  Pain Intervention(s) Limited activity within patient's tolerance  Cognition  Arousal/Alertness Awake/alert  Behavior During Therapy Impulsive  Overall Cognitive Status Impaired/Different from baseline  Area of Impairment Attention;Memory;Following commands;Safety/judgement;Problem solving;Orientation;Awareness  Orientation Level Situation;Time  Current Attention Level Sustained  Memory Decreased short-term memory  Following Commands Follows one step commands inconsistently  Safety/Judgement Decreased awareness of safety;Decreased awareness of deficits  Awareness Emergent  Problem Solving Requires tactile cues;Difficulty sequencing;Requires verbal cues;Slow processing  General Comments difficulty sequencing; perseverating  Difficult to assess due to  (aphasia)  Upper Extremity Assessment   Upper Extremity Assessment Generalized weakness  Lower Extremity Assessment  Lower Extremity Assessment Defer to PT evaluation  ADL  Overall ADL's  Needs assistance/impaired  Grooming Cueing for sequencing;Sitting;Standing  Upper Body Bathing Minimal assistance  Lower Body Bathing Moderate assistance;Sit to/from stand  Upper Body Dressing  Minimal assistance  Lower Body Dressing Moderate assistance;Sit to/from stand  Toileting- Water quality scientist and Hygiene Minimal assistance  Toileting - Clothing Manipulation Details (indicate cue type and reason) apparently had to urinate and pulled the trashcan next to him to urinate in rather than notifying therapist or asking to go to the bathroom. Pt given urinal and used appropriately  Functional mobility during ADLs Minimal assistance (HHA)  General ADL Comments Pt attempting to wash face  Bed Mobility  Overal bed mobility Needs Assistance  General bed mobility comments supervision for safety due to pt's impulsivity however no physical assist or cues required   Balance  Overall balance assessment Needs assistance  Sitting-balance support No upper extremity supported;Feet supported  Sitting balance-Leahy Scale Good  Standing balance support Single extremity supported;During functional activity;Bilateral upper extremity supported  Standing balance-Leahy Scale Poor  Standing balance comment HHA used  Restrictions  Weight Bearing Restrictions No  Transfers  Overall transfer level Needs assistance  Equipment used 1 person hand held assist  Transfers Sit to/from Stand  Sit to Stand Min assist  General transfer comment assistance required for balance and safety; pt reaching for something to hold onto upon standing  OT - End of Session  Equipment Utilized During Treatment Gait belt  Activity Tolerance Patient tolerated treatment well  Patient left in chair;with call bell/phone within reach;with chair alarm set  Nurse Communication Mobility  status  OT Assessment/Plan  OT Plan Discharge plan remains appropriate  OT Visit Diagnosis Unsteadiness on feet (R26.81);Other abnormalities of gait and mobility (R26.89);Muscle weakness (generalized) (M62.81);Other symptoms and signs involving cognitive function;Pain  Pain - Right/Left Left  Pain - part of body Hip  OT Frequency (ACUTE ONLY) Min 2X/week  Recommendations for Other Services Rehab consult  Follow Up Recommendations CIR;Supervision/Assistance - 24 hour  OT Equipment Other (comment) (TBA)  AM-PAC OT "6 Clicks" Daily Activity Outcome Measure (Version 2)  Help from another person eating meals? 3  Help from another person taking care of personal grooming? 3  Help from another person toileting, which includes using toliet, bedpan, or urinal? 2  Help from another person bathing (including washing, rinsing, drying)? 2  Help from another person to put on and taking off regular upper body clothing? 3  Help from another person to put on and taking off regular lower body clothing? 2  6 Click Score 15  OT Goal Progression  Progress towards OT goals Progressing toward goals  Acute Rehab OT Goals  Patient Stated Goal To go home  OT Goal Formulation With patient  Time For Goal Achievement 01/14/20  Potential to Achieve Goals Good  ADL Goals - additional goals established  Additional ADL Goal #1 pt will complete pathfinding task with 5 steps mod i  Additional ADL Goal #2 pt will locate 2 objects in left visual field without cues  Additional ADL Goal #3 pt will demonstrate balance DGI with score > 19 to indicate decrease fall risk with adls.  Maurie Boettcher, OT/L   Acute OT Clinical Specialist Acute Rehabilitation Services Pager [phone] Office [phone]

## 2020-01-08 NOTE — Progress Notes (Signed)
Patient ID: Ryan Rice, male   DOB: [birth year], 69 y.o.   MRN: [id number] [facility] Progress Note   Assessment/ Plan:   1.  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  Renal allograft ultrasound unremarkable.  Patient maintained on his usual immunosuppressive regimen without any changes.  Hyperkalemia corrected with forced diuresis. 2.  Altered mental status/expressive aphasia: With imaging showing an evolving subacute left ACA territory infarct with associated petechial hemorrhage.  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  Plans noted for possible admission to CIR for intensive therapy. 3.  Hypertension: Blood pressure under acceptable control at this time on labetalol and nifedipine. 4.  Atrial fibrillation: Rate controlled on labetalol and plans noted to begin Eliquis which may need to be coordinated closely with prostate biopsy timing. 6.  Diabetes mellitus: Closely managed by Dr. Elyse Hsu as an outpatient.  Current inpatient management per primary service.  I will sign off at this time and have reminded him to follow-up with his outpatient nephrologist.  Maintain current immunosuppressive therapy with encouragement of oral fluid intake (at least 48-60 ounces a day).  No additional changes at this time.  Please call with questions or concerns during hospitalization.  Subjective:   Overnight telemetry events noted with NSVT.  Denies any chest pain or shortness of breath  Objective:   BP (!) 122/54 (BP Location: Right Arm)   Pulse (!) 54   Temp 98.8 F (37.1 C)   Resp 20   Ht 5\' 11"  (1.803 m)   Wt 86.6 kg   SpO2 100%   BMI 26.64 kg/m   Intake/Output Summary (Last 24 hours) at 01/08/2020 0847 Last data filed at 01/08/2020  0435 Gross per 24 hour  Intake --  Output 1300 ml  Net -1300 ml   Weight change:   Physical Exam: Gen: Performing self-care with assistance from PT. CVS: Pulse regular rhythm, bradycardia, S1 and S2 Resp: Clear to auscultation bilaterally, no rales/rhonchi Abd: Soft, flat, nontender Ext: No lower extremity edema.  Left upper arm AVF  Imaging: CT HEAD WO CONTRAST  Result Date: 01/06/2020 CLINICAL DATA:  Stroke, follow-up. EXAM: CT HEAD WITHOUT CONTRAST TECHNIQUE: Contiguous axial images were obtained from the base of the skull through the vertex without intravenous contrast. COMPARISON:  Prior head CT examinations 01/04/2020 and earlier, brain MRI 01/02/2020, brain MRI 12/30/2019. FINDINGS: Brain: No significant change in size of a paramedian anterior left frontal lobe lesion. As before, there is associated petechial hemorrhage without frank hemorrhagic conversion. Also unchanged, there is partial effacement of the anterior horn of the left lateral ventricle. No midline shift. An evolving subacute ischemic infarct involving portions of the left temporoparietal and occipital lobes has not significantly changed in size. Question minimal associated petechial hemorrhage. No frank hemorrhagic conversion. No new demarcated cortical infarct is identified. Redemonstrated small chronic cortically based infarcts within the right parietal and occipital lobes. Stable background chronic small vessel ischemic disease and generalized parenchymal atrophy again. No hydrocephalus. No extra-axial fluid collection. Vascular: No hyperdense vessel. Skull: Normal. Negative for fracture or focal lesion. Sinuses/Orbits: Visualized orbits demonstrate no acute abnormality. Minimal ethmoid sinus mucosal thickening. No significant mastoid effusion. IMPRESSION: 1. Unchanged size and appearance of a lesion involving the paramedian anterior left frontal lobe. Please refer to prior brain MRI 01/02/2020 for  differential  considerations. Unchanged petechial hemorrhage associated with this lesion. 2. An evolving subacute ischemic infarct involving portions of the left temporoparietal and occipital lobes has not significantly changed in size. There may be minimal associated petechial hemorrhage without frank hemorrhagic conversion. 3. No worsening mass effect or midline shift. 4. Redemonstrated chronic cortically based infarcts within the right parietal and occipital lobes. 5. Stable background generalized parenchymal atrophy and chronic small vessel ischemic disease. Electronically Signed   By: Kellie Simmering DO   On: 01/06/2020 13:21   US Renal Transplant w/Doppler  Result Date: 01/08/2020 CLINICAL DATA:  Acute kidney injury, previous renal transplant EXAM: ULTRASOUND OF RENAL TRANSPLANT WITH RENAL DOPPLER ULTRASOUND TECHNIQUE: Ultrasound examination of the renal transplant was performed with gray-scale, color and duplex doppler evaluation. COMPARISON:  03/01/2019 FINDINGS: Transplant kidney location: RLQ Transplant Kidney: Renal measurements: 9.6 x 5.8 x 7.1 = volume: 265mL (previously 237). Normal in size and parenchymal echogenicity. No evidence of mass or hydronephrosis. No peri-transplant fluid collection seen. Color flow in the main renal artery:  Yes Color flow in the main renal vein:  Yes Duplex Doppler Evaluation: Main Renal Artery Resistive Index: 0.8 Venous waveform in main renal vein:  Present Intrarenal resistive index in upper pole:  0.72 (normal 0.6-0.8; equivocal 0.8-0.9; abnormal >= 0.9) Intrarenal resistive index in lower pole: 0.71 (normal 0.6-0.8; equivocal 0.8-0.9; abnormal >= 0.9) Bladder: Incompletely distended, unremarkable. No ureteral jets demonstrated. Other findings:  None. IMPRESSION: 1. Stable appearance of transplant kidney.  No hydronephrosis. Electronically Signed   By: Lucrezia Europe M.D.   On: 01/08/2020 08:32    Labs: BMET Recent Labs  Lab 01/02/20 0430 01/04/20 0418 01/06/20 0337  01/07/20 0330 01/08/20 0348  NA 136 135 131* 132*  131* 131*  K 4.2 4.5 5.1 4.8  4.7 4.9  CL 108 107 104 104  104 102  CO2 18* 18* 21* 21*  20* 21*  GLUCOSE 160* 172* 182* 120*  118* 226*  BUN 33* 41* 54* 50*  51* 52*  CREATININE 1.14 1.22 1.56* 1.19  1.18 1.26*  CALCIUM 8.6* 9.0 8.7* 8.9  8.8* 8.9  PHOS  --   --   --  2.6  --    CBC Recent Labs  Lab 01/02/20 0430 01/04/20 0418 01/06/20 0337 01/07/20 0330  WBC 7.4 9.1 9.7 10.1  NEUTROABS 6.8 8.3* 8.7* 9.0*  HGB 12.7* 14.0 14.3 14.3  HCT 39.1 43.7 43.8 44.1  MCV 83.5 84.4 83.3 81.5  PLT 130* 161 182 174    Medications:    . apixaban  5 mg Oral BID  . atorvastatin  80 mg Oral q1800  . brimonidine  1 drop Right Eye BID  . cinacalcet  60 mg Oral Daily  . dexamethasone (DECADRON) injection  8 mg Intravenous Q6H  . docusate sodium  100 mg Oral BID  . dorzolamide-timolol  1 drop Right Eye BID  . famotidine  20 mg Oral Daily  . finasteride  5 mg Oral Daily  . folic acid  1 mg Oral Daily  . hydrALAZINE  25 mg Oral Q6H  . insulin aspart  0-15 Units Subcutaneous TID WC  . insulin aspart  0-5 Units Subcutaneous QHS  . insulin glargine  20 Units Subcutaneous Daily  . magnesium oxide  400 mg Oral Daily  . metoprolol tartrate  12.5 mg Oral BID  . mycophenolate  360 mg Oral BID  . NIFEdipine  60 mg Oral Daily  . sulfamethoxazole-trimethoprim  1 tablet Oral Q  M,W,F  . tacrolimus  2 mg Oral BID  . tamsulosin  0.4 mg Oral QPC breakfast   Elmarie Shiley, MD 01/08/2020, 8:47 AM

## 2020-01-08 NOTE — Progress Notes (Signed)
PROGRESS NOTE  Ryan Rice [id number] DOB: 10-03-1950 DOA: 12/29/2019 PCP: Drosinis, Pamalee Leyden, PA-C  Brief History    Ryan Rice is a 69 y.o. male with medical history significant of PVD; renal transplant (2013); HTN; DM; and afib presenting with AMS.  He presented to [facility] last night for AMS.  His wife noticed Saturday that he wasn't talking much, was staring.  When asked a question, he would hesitate and stutter.  They were concerned about a ministroke.  He did not seem to have other symptoms.  She saw a [place] of this Friday but Saturday it was much worse.  No headaches or vision changes.  He has noticed expressive aphasia. Occasional tremor.  Intermittent urinary incontinence.   He is usually a talker and less able to do that.  No apparent changes in executive function.  The patient denies unintentional weight loss or night sweats.  He is eating and drinking ok.  He was recently taken off of his anticoagulation by his PCP in preparation for a prostate biopsy that was to have taken place this week at [facility]. The patient has had an elevated PSA. It was 9.34 in 10/2019, up from 6.79 in 05/2019.  He also has left hip and leg pain.  This has been going on for a couple of months and is about the same.  It hurts in his hip and radiates down his entire leg.  No back pain.   Neurosurgery has been consulted. The plan was to perform brain biopsy on 01/02/2020. However, repeat MRI performed prior to procedure demonstrated new infarct. The procedure has been postponed until the patient has completed work up for the CVA. Neurology has been consulted and stroke work up initiated.  Echocardiogram demonstrates EF of 55-60%, Nl LV function. No regional wall motion abnormalities. Mild LVH is present and LV diastolic function could not be evaluated. RV systolic function is normal. They were unable to assess PA pressure due to inadequate signal from tricuspid regurgitation. LA and RA were moderately dilated. LV  function has improved from previous. No intracardiac source of thrombus was seen.  Carotid ultrasound: BL ICA demonstrate 1-39% stenosis. Antegrade flow of bilateral vertebral arteries.   EKG demonstrates atrial fibrillation with a controlled ventricular response. Initiation of anticoagulation has been discussed with [name]. As the petechial hemorrhages seen on MRI occurred more than a week ago, [name] recommends starting Eliquis. Pharmacy has been consulted to manage the initiation of Eliquis.  On 01/04/2020 the patient had a 2 second pause on telemetry. Labetalol 100 mg bid has been stopped and the patient has been changed to metoprolol 12.5 mg bid. This change seems to have addressed this problem as well as the patient's blood pressure. Eliquis has been started. Will monitor on Eliquis for one day.  The patient has been discussed with [name]. He states that the apparent mass on MRI is much more likely a stroke. No further follow up with neurosurgery is necessary. The patient will follow up with stroke clinic on discharge.  Discharge to home was anticipated this morning after I visited this patient in his room. However, I received a message from nursing that the patient was now having difficulty answering questions that he had no difficulty with this morning. Repeat CT head was performed and demonstrated no significant change. The patient also had some changes on telemetry. EKG showed these to most likely be just another manifestation of early repolarization abnormality. SLP has re-evaluated the patient. They now recommend  inpatient rehab for the patient. Discharge has been cancelled, and PT/OT and inpatient rehab have been asked to re-evaluated the patient for discharge disposition. Consultants  . Neurosurgery . Neurology  Procedures  . None  Antibiotics   Anti-infectives (From admission, onward)   Start     Dose/Rate Route Frequency Ordered Stop   01/02/20 0000  ceFAZolin (ANCEF)  IVPB 2g/100 mL premix  Status:  Discontinued     2 g 200 mL/hr over 30 Minutes Intravenous 30 min pre-op 01/01/20 2039 01/02/20 1958   01/02/20 0000  vancomycin (VANCOCIN) IVPB 1000 mg/200 mL premix  Status:  Discontinued     1,000 mg 200 mL/hr over 60 Minutes Intravenous 60 min pre-op 01/01/20 2039 01/02/20 1958   12/30/19 1100  sulfamethoxazole-trimethoprim (BACTRIM) 400-80 MG per tablet 1 tablet     1 tablet Oral Every M-W-F 12/30/19 1009       Subjective  The patient is sitting up at bedside. No new complaints.  Objective   Vitals:  Vitals:   01/08/20 0811 01/08/20 1135  BP: (!) 122/54 133/66  Pulse: (!) 54 63  Resp: 20 16  Temp: 98.8 F (37.1 C) (!) 97.4 F (36.3 C)  SpO2: 100% 100%   Exam:  Constitutional:  . The patient is awake, alert, and oriented x 3. No acute distress.   Respiratory:  . No increased work of breathing. . No wheezes, rales, or rhonchi . No tactile fremitus Cardiovascular:  . Regular rate and rhythm . No murmurs, ectopy, or gallups. . No lateral PMI. No thrills. Abdomen:  . Abdomen is soft, non-tender, non-distended . No hernias, masses, or organomegaly . Normoactive bowel sounds.  Musculoskeletal:  . No cyanosis, clubbing, or edema Skin:  . No rashes, lesions, ulcers . palpation of skin: no induration or nodules Neurologic:  . CN 2-12 intact . Sensation all 4 extremities intact . Motor function is intact. Psychiatric:  . Mental status o Mood, affect appropriate o Orientation to person, place, time  . judgment and insight appear intact  I have personally reviewed the following:   Today's Data  . Vitals, BMP, CBC  Imaging  . MRI Brain 12/30/2019, 01/02/2020 . MRA Brain 01/03/2020 . Carotid ultrasound 01/03/2020 . Echocardiogram: pending . CT head without contrast: No evidence for frank hemorrhagic transformation.  Scheduled Meds: . apixaban  5 mg Oral BID  . atorvastatin  80 mg Oral q1800  . brimonidine  1 drop Right Eye BID  .  cinacalcet  60 mg Oral Daily  . docusate sodium  100 mg Oral BID  . dorzolamide-timolol  1 drop Right Eye BID  . famotidine  20 mg Oral Daily  . finasteride  5 mg Oral Daily  . folic acid  1 mg Oral Daily  . hydrALAZINE  25 mg Oral Q6H  . insulin aspart  0-15 Units Subcutaneous TID WC  . insulin aspart  0-5 Units Subcutaneous QHS  . insulin glargine  20 Units Subcutaneous Daily  . magnesium oxide  400 mg Oral Daily  . metoprolol tartrate  12.5 mg Oral BID  . mycophenolate  360 mg Oral BID  . NIFEdipine  60 mg Oral Daily  . [START ON 01/24/2020] predniSONE  10 mg Oral Q breakfast  . [START ON 01/21/2020] predniSONE  20 mg Oral Q breakfast  . [START ON 01/18/2020] predniSONE  30 mg Oral Q breakfast  . [START ON 01/15/2020] predniSONE  40 mg Oral Q breakfast  . [START ON 01/12/2020] predniSONE  50 mg  Oral Q breakfast  . [START ON 01/09/2020] predniSONE  60 mg Oral Q breakfast  . sulfamethoxazole-trimethoprim  1 tablet Oral Q M,W,F  . tacrolimus  2 mg Oral BID  . tamsulosin  0.4 mg Oral QPC breakfast   Continuous Infusions:   Principal Problem:   Brain tumor ([place]) Active Problems:   AKI (acute kidney injury) ([place])   Kidney transplant recipient   Diabetes ([place])   Glaucoma   Hip pain, bilateral   Elevated PSA   Atrial fibrillation, chronic (HCC)   Cerebral embolism with cerebral infarction   LOS: 9 days   A & P  Brain tumor: Patient with acute onset of confusion, expressive aphasia. 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 This is the source of his symptoms. Neurosurgery had planned to take the patient to the OR for a biopsy. However, as the patient was found to have new CVA on repeat MRI prior to planned biopsy, the brain biopsy was cancelled lieu of stroke work up. Neurology was consulted. [name] has evaluated the patient and feels that Brain Biopsy is no longer necessary. I have discussed the patient with [name]. He  believes that the abnormality seen on MRI was infact a CVA. Stroke work up has been completed. The patient is found to be in atrial fibrillation. Initiation of anticoagulation has been discussed with [name]. As the petechial hemorrhages seen on MRI occurred more than a week ago, [name] recommended starting Eliquis. Pharmacy has been consulted to manage the initiation of Eliquis. Steroids are being tapered down to the patient's usual dose of prednisone 10 mg daily. Hemoglobin has been stable on Eliquis.  Aniscoria: Likely due to brain mass. Unlikely to be due to CVA per neurology. Pt is on steroids to address vasogenic edema. Plan is for brain biopsy after stroke work up is completed.   Elevated PSA: Patient had planned to have prostate biopsy on 01/01/2020 at [facility]. Family is deciding if the want the patient to go ahead and have the procedure done here, or wait and have it done at [facility] as originally planned. The patient is also experiencing urinary incontinence. He will be continued on proscar and flomax. He is taking bactrim 3x a week for UTI prophylaxis.  AKI with h/o renal transplantation: Worsening. No recent labs since almost a year ago, but currently with significantly worsened creatinine/GFR. This may be associated with decreased PO intake in the setting of recent confusion. Creatinine is increasing. Will check a renal ultrasound. Continue IV fluids. Also will avoid nephrotoxic medications and hypotension. He will be continued on Sensipar. Voltaren gel will be held. He will be continued on prograf and myfortic. As creatinine is increasing, will consult nephrology. I appreciate their help  DM II: Recent A1c 6.8, indicating good control. PO meds (Januvia, Gluocphage, Starlix, Actos) have been held. He will receive Lantus 14 units. His glucoses will be followed with FSBS and SSI. For the last 24 hours glucoses have run from 198 - 279. Steroids are being weaned.  HTN: Continue  Nifedipine and Labetalol. Blood pressures under fair control.  Atrial fibrillation: Rate controlled with labetalol. Coumadin is on hold due to upcoming prostate biopsy/brain biopsy/petechial hemorrhages on MRI Brain. Monitor on telemetry. He is on Eliquis and has been stable on this for 3 days.  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 He complains of  pain along the left leg as well as gait instability that appears to be related to the L hip. For now, pain management as needed with eventual plan for orthopedics consult as outpatient and likely to need THR.  Glaucoma: Continue Alphagan, Cosopt  HLD: Continue Lipitor  Hyperkalemia: Improved with IV fluids. Monitor.  I have seen and examined this patient myself. I have spent 32 minutes in his evaluation and care.  DVT prophylaxis:  SCDs Code Status:  Full - confirmed with patient/family Family Communication: No family available. Disposition Plan: Patient is from home. Plan is to discharge to [facility]/CIR . The patient is medically cleared for discharge. We are awaiting insurance clearance and a bed. Jonna Dittrich, DO [facility] Direct contact: see [email or web address]  7PM-7AM contact night coverage as above 01/08/2020, 2:56 PM  LOS: 1 day

## 2020-01-08 NOTE — Progress Notes (Signed)
Physical Therapy Treatment Patient Details Name: Ryan Rice MRN: [id number] DOB: [birth year] Today's Date: 01/08/2020    History of Present Illness 69 y.o. male with medical history significant of PVD; renal transplant (2013); HTN; DM; and afib presenting with AMS. Pt reporting some expressive aphasia, tremors, urinary incontinence. Pt also reports L hip and leg pain for a few months. 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 Treatment in place for infarct vs neoplasm    PT Comments    Patient seen for PT treatment. Continue to recommend CIR for further skilled PT services to decrease risk of falls and caregiver burden.    Follow Up Recommendations  Supervision/Assistance - 24 hour;CIR     Equipment Recommendations  None recommended by PT    Recommendations for Other Services       Precautions / Restrictions Precautions Precautions: Fall Restrictions Weight Bearing Restrictions: No    Mobility  Bed Mobility Overal bed mobility: Needs Assistance             General bed mobility comments: supervision for safety due to pt's impulsivity however no physical assist or cues required   Transfers Overall transfer level: Needs assistance Equipment used: 1 person hand held assist Transfers: Sit to/from Stand Sit to Stand: Min assist         General transfer comment: assistance required for balance and safety; pt reaching for something to hold onto upon standing  Ambulation/Gait Ambulation/Gait assistance: Min assist;Mod assist Gait Distance (Feet): (~180 ft total with seated standing breaks) Assistive device: 1 person hand held assist(assist at trunk with gait belt; pt using rail at times) Gait Pattern/deviations: Trunk flexed;Step-through pattern;Decreased stride length;Drifts  right/left;Antalgic;Decreased stance time - left;Decreased step length - right;Wide base of support Gait velocity: varying    General Gait Details: forward flexed posture and at times unsafe gait speed; multimodal cues required for navigating distracting environment; assistance for balance and safety; needs cues for rest breaks before getting so fatigued he needs to sit down   Stairs             Wheelchair Mobility    Modified Rankin (Stroke Patients Only) Modified Rankin (Stroke Patients Only) Modified Rankin: Moderately severe disability     Balance Overall balance assessment: Needs assistance Sitting-balance support: No upper extremity supported;Feet supported Sitting balance-Leahy Scale: Good     Standing balance support: Single extremity supported;During functional activity;Bilateral upper extremity supported Standing balance-Leahy Scale: Poor Standing balance comment: Use of AD for safety                            Cognition Arousal/Alertness: Awake/alert Behavior During Therapy: Impulsive Overall Cognitive Status: Impaired/Different from baseline Area of Impairment: Attention;Memory;Following commands;Safety/judgement;Problem solving;Orientation;Awareness                 Orientation Level: Situation;Time Current Attention Level: Sustained Memory: Decreased short-term memory Following Commands: Follows one step commands inconsistently Safety/Judgement: Decreased awareness of safety;Decreased awareness of deficits Awareness: Emergent Problem Solving: Requires tactile cues;Difficulty sequencing;Requires verbal cues;Slow processing        Exercises      General Comments        Pertinent Vitals/Pain Pain Assessment: Faces Faces Pain Scale: Hurts [place] more Pain Location: L hip with mobility Pain Descriptors / Indicators: Aching;Guarding Pain Intervention(s): Limited activity within patient's  tolerance    Home Living                       Prior Function            PT Goals (current goals can now be found in the care plan section) Acute Rehab PT Goals Patient Stated Goal: To go home Progress towards PT goals: Progressing toward goals    Frequency    Min 3X/week      PT Plan Current plan remains appropriate    Co-evaluation              AM-PAC PT "6 Clicks" Mobility   Outcome Measure  Help needed turning from your back to your side while in a flat bed without using bedrails?: None Help needed moving from lying on your back to sitting on the side of a flat bed without using bedrails?: None Help needed moving to and from a bed to a chair (including a wheelchair)?: A [place] Help needed standing up from a chair using your arms (e.g., wheelchair or bedside chair)?: A [place] Help needed to walk in hospital room?: A Lot Help needed climbing 3-5 steps with a railing? : A Lot 6 Click Score: 18    End of Session Equipment Utilized During Treatment: Gait belt Activity Tolerance: Patient tolerated treatment well Patient left: with call bell/phone within reach;in chair;with chair alarm set Nurse Communication: Mobility status PT Visit Diagnosis: Other abnormalities of gait and mobility (R26.89);Other symptoms and signs involving the nervous system [id number])     Time: [id number] PT Time Calculation (min) (ACUTE ONLY): 27 min  Charges:  $Gait Training: 23-37 mins                     [name], PTA Acute Rehabilitation Services Pager: [phone] Office: [phone]     [name] 01/08/2020, 10:30 AM

## 2020-01-08 NOTE — Progress Notes (Signed)
Per telemetry 25 beats VT at 0046, Opyd updated.

## 2020-01-08 NOTE — Progress Notes (Signed)
Inpatient Diabetes Program Recommendations  AACE/ADA: New Consensus Statement on Inpatient Glycemic Control (2015)  Target Ranges:  Prepandial:   less than 140 mg/dL      Peak postprandial:   less than 180 mg/dL (1-2 hours)      Critically ill patients:  140 - 180 mg/dL   Lab Results  Component Value Date   GLUCAP 222 (H) 01/08/2020   HGBA1C 7.1 (H) 12/30/2019    Review of Glycemic Control  Results for BRANDN, SLIWINSKI (MRN [id number]) as of 01/08/2020 12:33  Ref. Range 01/07/2020 11:39 01/07/2020 16:23 01/07/2020 21:12 01/08/2020 06:21 01/08/2020 11:37  Glucose-Capillary Latest Ref Range: 70 - 99 mg/dL 218 (H) 198 (H) 279 (H) 212 (H) 222 (H)    Diabetes history: DM2 Outpatient Diabetes medications: Januvia 100 mg QD + Metformin 500 BID + Actos 30 mg QD  Current orders for Inpatient glycemic control: Novolog 0-15 TID + 0-5 QHS+ Lantus 20 units daily + Decadron 8 mg Q6H  Inpatient Diabetes Program Recommendations:    If steroids continue, please consider,  Lantus 25 units daily Novolog 3 units TID with meals   Thank you, Reche Dixon, RN, BSN Diabetes Coordinator Inpatient Diabetes Program [phone] (team pager from 8a-5p)

## 2020-01-08 NOTE — Plan of Care (Signed)

## 2020-01-08 NOTE — Progress Notes (Signed)
Pt's wife returned call.  Provided wife with details about CIR goals and expectations.  She acknowledged understanding and told me that she could not provide 24/7 supervision for pt after d/c.  She also told me she was unable to arrange friends/family to supervise pt when she was not available.  Explained to wife that the alternate rehab option is SNF.  Wife is agreeable to SNF rehab.  Will sign off on this pt.  Gayland Curry, [place], [place] Admissions Coordinator [phone]

## 2020-01-09 DIAGNOSIS — D496 Neoplasm of unspecified behavior of brain: Secondary | ICD-10-CM | POA: Diagnosis not present

## 2020-01-09 LAB — GLUCOSE, CAPILLARY
Glucose-Capillary: 164 mg/dL — ABNORMAL HIGH (ref 70–99)
Glucose-Capillary: 110 mg/dL — ABNORMAL HIGH (ref 70–99)
Glucose-Capillary: 138 mg/dL — ABNORMAL HIGH (ref 70–99)
Glucose-Capillary: 198 mg/dL — ABNORMAL HIGH (ref 70–99)

## 2020-01-09 LAB — BASIC METABOLIC PANEL
Anion gap: 8 (ref 5–15)
BUN: 53 mg/dL — ABNORMAL HIGH (ref 8–23)
CO2: 22 mmol/L (ref 22–32)
Calcium: 8.7 mg/dL — ABNORMAL LOW (ref 8.9–10.3)
Chloride: 101 mmol/L (ref 98–111)
Creatinine, Ser: 1.33 mg/dL — ABNORMAL HIGH (ref 0.61–1.24)
GFR calc Af Amer: >60 mL/min (ref >60)
GFR calc non Af Amer: 54 mL/min — ABNORMAL LOW (ref >60)
Glucose, Bld: 161 mg/dL — ABNORMAL HIGH (ref 70–99)
Potassium: 4.9 mmol/L (ref 3.5–5.1)
Sodium: 131 mmol/L — ABNORMAL LOW (ref 135–145)

## 2020-01-09 NOTE — Progress Notes (Signed)
  Speech Language Pathology Treatment: Cognitive-Linquistic  Patient Details Name: Ryan Rice MRN: [id number] DOB: 1951/04/03 Today's Date: 01/09/2020 Time: [id number] SLP Time Calculation (min) (ACUTE ONLY): 16 min  Assessment / Plan / Recommendation Clinical Impression  Pt was seen for skilled ST targeting aphasia.  He was encountered awake/alert and was pleasant throughout this tx session.  He demonstrated good use of automatic speech with greetings and farewells, but during conversational speech he exhibited communication breakdowns with significant anomia and semantic/phonemic paraphasias.  Pt answered basic yes/no questions with 2/10 accuracy and he responded "yes" to all but one question despite cues.  He following 1-step commands with 70% accuracy decreasing to 15% accuracy when two-step commands were introduced. Visual cues were not helpful for increasing accuracy.  Pt completed a confrontational naming task with 3/10 accuracy, improving to 7/10 given moderate semantic and phonemic cues.  Pt demonstrated a relative strength in automatic speech (counting, days of the week, months of the year) and single word/short phrase repetition.  Pt repeated single words with 90% accuracy, but this decreased to 40% accuracy with 4+word phrases.  Responsive naming was also a strength for this patient as he answer the questions with 85% accuracy independently.  Recommend additional ST 5x weekly, high intensity (CIR) at time of discharge targeting aphasia.  SLP will f/u acutely.     HPI HPI: 69 y.o. male with medical history significant of PVD; renal transplant (2013); HTN; DM; and afib presenting with AMS. Pt reporting some expressive aphasia, tremors, urinary incontinence. Pt also reports L hip and leg pain for a few months. 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 Pt reported that he currently is getting home health ST targeting language deficits.       SLP Plan  Continue with current plan of  care       Recommendations                   Oral Care Recommendations: Oral care BID Follow up Recommendations: Inpatient Rehab SLP Visit Diagnosis: Aphasia (R47.01) Plan: Continue with current plan of care       [place]., M.S., [place] Acute Rehabilitation Services Office: [phone]  [place] 01/09/2020, 10:32 AM

## 2020-01-09 NOTE — Progress Notes (Signed)
PROGRESS NOTE    Ryan Rice  [id number] DOB: [birth year] DOA: 12/29/2019 PCP: Drosinis, Pamalee Leyden, PA-C   Brief Narrative:  Phinn Stauff a 69 y.o.malewith medical history significant ofPVD; renal transplant(2013); HTN; DM; and afib presenting with AMS.He presented to [facility] last night for AMS. His wife noticed Saturday that he wasn't talking much, was staring. When asked a question, he would hesitate and stutter. They were concerned about a ministroke. He did not seem to have other symptoms. She saw a [place] of this Friday but Saturday it was much worse. No headaches or vision changes. He has noticed expressive aphasia. Occasional tremor. Intermittent urinary incontinence. He is usually a talker and less able to do that. No apparent changes in executive function. The patient denies unintentional weight loss ornight sweats. He is eating and drinking ok. He was recently taken off of his anticoagulation by his PCP in preparation for a prostate biopsy that was to have taken place this week at [facility]. The patient has had an elevated PSA. It was 9.34 in 10/2019, up from 6.79 in 05/2019.  He also has left hip and leg pain. This has been going on for a couple of months and is about the same. It hurts in his hip and radiates down his entire leg. No back pain.   Neurosurgery has been consulted. The plan was to perform brain biopsy on 01/02/2020. However, repeat MRI performed prior to procedure demonstrated new infarct. The procedure has been postponed until the patient has completed work up for the CVA. Neurology has been consulted and stroke work up initiated.  Echocardiogram demonstrates EF of 55-60%, Nl LV function. No regional wall motion abnormalities. Mild LVH is present and LV diastolic function could not be evaluated. RV systolic function is normal. They were unable to assess PA pressure due to inadequate signal from tricuspid regurgitation. LA and RA were moderately  dilated. LV function has improved from previous. No intracardiac source of thrombus was seen.  Carotid ultrasound: BL ICA demonstrate 1-39% stenosis. Antegrade flow of bilateral vertebral arteries.   EKG demonstrates atrial fibrillation with a controlled ventricular response. Initiation of anticoagulation has been discussed with [name]. As the petechial hemorrhages seen on MRI occurred more than a week ago, [name] recommends starting Eliquis. Pharmacy has been consulted to manage the initiation of Eliquis.  On 01/04/2020 the patient had a 2 second pause on telemetry. Labetalol 100 mg bid has been stopped and the patient has been changed to metoprolol 12.5 mg bid. This change seems to have addressed this problem as well as the patient's blood pressure. Eliquis has been started. Will monitor on Eliquis for one day.  The patient has been discussed with [name]. He states that the apparent mass on MRI is much more likely a stroke. No further follow up with neurosurgery is necessary. The patient will follow up with stroke clinic on discharge.  Discharge to home was anticipated this morning after I visited this patient in his room. However, I received a message from nursing that the patient was now having difficulty answering questions that he had no difficulty with this morning. Repeat CT head was performed and demonstrated no significant change. The patient also had some changes on telemetry. EKG showed these to most likely be just another manifestation of early repolarization abnormality. SLP has re-evaluated the patient. They now recommend inpatient rehab for the patient. Discharge has been cancelled, and PT/OT and inpatient rehab have been asked to re-evaluated the patient for discharge  disposition.  Assessment & Plan:   Principal Problem:   Brain tumor [facility]) Active Problems:   AKI (acute kidney injury) ([place])   Kidney transplant recipient   Diabetes ([place])   Glaucoma   Hip pain,  bilateral   Elevated PSA   Atrial fibrillation, chronic (HCC)   Cerebral embolism with cerebral infarction  Brain tumor: Patient with acute onset of confusion, expressive aphasia. 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 This is the source of his symptoms. Neurosurgery had planned to take the patient to the OR for a biopsy. However, as the patient was found to have new CVA on repeat MRI prior to planned biopsy, the brain biopsy was cancelled lieu of stroke work up. Neurology was consulted. [name] has evaluated the patient and feels that Brain Biopsy is no longer necessary. I have discussed the patient with [name]. He believes that the abnormality seen on MRI was infact a CVA. Stroke work up has been completed. The patient is found to be in atrial fibrillation. Initiation of anticoagulation has been discussed with [name]. As the petechial hemorrhages seen on MRI occurred more than a week ago, [name] recommended starting Eliquis. Pharmacy has been consulted to manage the initiation of Eliquis. Steroids are being tapered down to the patient's usual dose of prednisone 10 mg daily. Hemoglobin has been stable on Eliquis.  Aniscoria: Likely due to brain mass. Unlikely to be due to CVA per neurology. Pt is on steroids to address vasogenic edema. Plan is for brain biopsy after stroke work up is completed.   Elevated PSA: Patient had planned to have prostate biopsy on 01/01/2020 at [facility]. Family is deciding if they want the patient to go ahead and have the procedure done here, or wait and have it done at [facility] as originally planned. The patient is also experiencing urinary incontinence. He will be continued on proscar and flomax. He is taking bactrim 3x a week for UTI prophylaxis.  AKI with h/o renal transplantation: Worsening. No recent labs since almost a year ago, but currently with significantly worsened creatinine/GFR. This  may be associated with decreased PO intake in the setting of recent confusion. Creatinine is increasing. Will check a renal ultrasound. Continue IV fluids. Also will avoid nephrotoxic medications and hypotension. He will be continued on Sensipar. Voltaren gel will be held. He will be continued on prograf and myfortic. As creatinine is increasing, will consult nephrology. I appreciate their help  DM II: Recent A1c 6.8, indicating good control. PO meds (Januvia, Gluocphage, Starlix, Actos) have been held. He will receive Lantus 14 units. His glucoses will be followed with FSBS and SSI. For the last 24 hours glucoses have run from 198 - 279. Steroids are being weaned.  HTN: Continue Nifedipine and Labetalol. Blood pressures under fair control.  Atrial fibrillation: Rate controlled with labetalol. Coumadin is on hold due to upcoming prostate biopsy/brain biopsy/petechial hemorrhages on MRI Brain. Monitor on telemetry. He is on Eliquis and has been stable on this for 3 days.  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 He complains of pain along the left leg as well as gait instability that appears to be related to the L hip. For now, pain management as needed with eventual plan for orthopedics consult as outpatient and likely to need THR.  Glaucoma: Continue Alphagan, Cosopt  HLD: Continue Lipitor  Hyperkalemia: Improved  with IV fluids. Monitor.  I have seen and examined this patient myself. I have spent 32 minutes in his evaluation and care.  DVT prophylaxis:SCDs/ Eliquis Code Status:Full - confirmed with patient/family Family Communication:No family available. Disposition Plan:Patient is from home. Plan is to discharge to [facility]IR . The patient is medically cleared for discharge. We are awaiting insurance clearance and a bed.   Consultants:   Neurology  Neurosurgery  Procedures: Antimicrobials:  Anti-infectives (From admission,  onward)   Start     Dose/Rate Route Frequency Ordered Stop   01/02/20 0000  ceFAZolin (ANCEF) IVPB 2g/100 mL premix  Status:  Discontinued     2 g 200 mL/hr over 30 Minutes Intravenous 30 min pre-op 01/01/20 2039 01/02/20 1958   01/02/20 0000  vancomycin (VANCOCIN) IVPB 1000 mg/200 mL premix  Status:  Discontinued     1,000 mg 200 mL/hr over 60 Minutes Intravenous 60 min pre-op 01/01/20 2039 01/02/20 1958   12/30/19 1100  sulfamethoxazole-trimethoprim (BACTRIM) 400-80 MG per tablet 1 tablet     1 tablet Oral Every M-W-F 12/30/19 1009        Subjective: Patient was seen and examined at bedside.  No overnight events.  Patient reports feeling cold, participated in physical therapy, eating fine.  Objective: Vitals:   01/09/20 0023 01/09/20 0305 01/09/20 0757 01/09/20 1248  BP: 123/68 136/66 116/63 (!) 106/56  Pulse: (!) 58 (!) 58 65 63  Resp: 16 18 18 17   Temp: 98.2 F (36.8 C) 97.7 F (36.5 C) 97.6 F (36.4 C) 98.4 F (36.9 C)  TempSrc: Oral Oral Oral Oral  SpO2: 98% 98% 98% 99%  Weight:      Height:        Intake/Output Summary (Last 24 hours) at 01/09/2020 1344 Last data filed at 01/09/2020 0300 Gross per 24 hour  Intake --  Output 700 ml  Net -700 ml   Filed Weights   12/29/19 1922 01/02/20 1910  Weight: 86.6 kg 86.6 kg    Examination:  General exam: Appears calm and comfortable  Respiratory system: Clear to auscultation. Respiratory effort normal. Cardiovascular system: S1 & S2 heard, RRR. No JVD, murmurs, rubs, gallops or clicks. No pedal edema. Gastrointestinal system: Abdomen is nondistended, soft and nontender. No organomegaly or masses felt. Normal bowel sounds heard. Central nervous system: Alert and oriented. No focal neurological deficits. Extremities: Symmetric 5 x 5 power. Skin: No rashes, lesions or ulcers Psychiatry: Judgement and insight appear normal. Mood & affect appropriate.     Data Reviewed: I have personally reviewed following labs and  imaging studies  CBC: Recent Labs  Lab 01/04/20 0418 01/06/20 0337 01/07/20 0330  WBC 9.1 9.7 10.1  NEUTROABS 8.3* 8.7* 9.0*  HGB 14.0 14.3 14.3  HCT 43.7 43.8 44.1  MCV 84.4 83.3 81.5  PLT 161 182 [id number]   Basic Metabolic Panel: Recent Labs  Lab 01/04/20 0418 01/06/20 0337 01/07/20 0330 01/08/20 0348 01/09/20 0348  NA 135 131* 132*  131* 131* 131*  K 4.5 5.1 4.8  4.7 4.9 4.9  CL 107 104 104  104 102 101  CO2 18* 21* 21*  20* 21* 22  GLUCOSE 172* 182* 120*  118* 226* 161*  BUN 41* 54* 50*  51* 52* 53*  CREATININE 1.22 1.56* 1.19  1.18 1.26* 1.33*  CALCIUM 9.0 8.7* 8.9  8.8* 8.9 8.7*  MG  --   --   --  1.8  --   PHOS  --   --  2.6  --   --  GFR: Estimated Creatinine Clearance: 55.8 mL/min (A) (by C-G formula based on SCr of 1.33 mg/dL (H)). Liver Function Tests: Recent Labs  Lab 01/07/20 0330  ALBUMIN 2.3*   No results for input(s): LIPASE, AMYLASE in the last 168 hours. No results for input(s): AMMONIA in the last 168 hours. Coagulation Profile: No results for input(s): INR, PROTIME in the last 168 hours. Cardiac Enzymes: No results for input(s): CKTOTAL, CKMB, CKMBINDEX, TROPONINI in the last 168 hours. BNP (last 3 results) No results for input(s): PROBNP in the last 8760 hours. HbA1C: No results for input(s): HGBA1C in the last 72 hours. CBG: Recent Labs  Lab 01/08/20 1137 01/08/20 1619 01/08/20 2105 01/09/20 0636 01/09/20 1135  GLUCAP 222* 241* 244* 164* 110*   Lipid Profile: No results for input(s): CHOL, HDL, LDLCALC, TRIG, CHOLHDL, LDLDIRECT in the last 72 hours. Thyroid Function Tests: No results for input(s): TSH, T4TOTAL, FREET4, T3FREE, THYROIDAB in the last 72 hours. Anemia Panel: No results for input(s): VITAMINB12, FOLATE, FERRITIN, TIBC, IRON, RETICCTPCT in the last 72 hours. Sepsis Labs: No results for input(s): PROCALCITON, LATICACIDVEN in the last 168 hours.  Recent Results (from the past 240 hour(s))  Surgical PCR screen      Status: None   Collection Time: 01/01/20 10:25 PM   Specimen: Nasal Mucosa; Nasal Swab  Result Value Ref Range Status   MRSA, PCR NEGATIVE NEGATIVE Final   Staphylococcus aureus NEGATIVE NEGATIVE Final    Comment: (NOTE) The Xpert SA Assay (FDA approved for NASAL specimens in patients 89 years of age and older), is one component of a comprehensive surveillance program. It is not intended to diagnose infection nor to guide or monitor treatment. Performed at [facility], [place] [address], [place], [place] [address]   SARS CORONAVIRUS 2 (TAT 6-24 HRS) Nasopharyngeal Nasopharyngeal Swab     Status: None   Collection Time: 01/07/20  2:24 PM   Specimen: Nasopharyngeal Swab  Result Value Ref Range Status   SARS Coronavirus 2 NEGATIVE NEGATIVE Final    Comment: (NOTE) SARS-CoV-2 target nucleic acids are NOT DETECTED. The SARS-CoV-2 RNA is generally detectable in upper and lower respiratory specimens during the acute phase of infection. Negative results do not preclude SARS-CoV-2 infection, do not rule out co-infections with other pathogens, and should not be used as the sole basis for treatment or other patient management decisions. Negative results must be combined with clinical observations, patient history, and epidemiological information. The expected result is Negative. Fact Sheet for Patients: SugarRoll.be Fact Sheet for Healthcare Providers: https://www.woods-mathews.com/ This test is not yet approved or cleared by the [place] FDA and  has been authorized for detection and/or diagnosis of SARS-CoV-2 by FDA under an Emergency Use Authorization (EUA). This EUA will remain  in effect (meaning this test can be used) for the duration of the COVID-19 declaration under Section 56 4(b)(1) of the Act, 21 U.S.C. section 360bbb-3(b)(1), unless the authorization is terminated or revoked sooner. Performed at [facility],  [place] [address], [place], [place] [address]      Radiology Studies: No results found.   Scheduled Meds: . apixaban  5 mg Oral BID  . atorvastatin  80 mg Oral q1800  . brimonidine  1 drop Right Eye BID  . cinacalcet  60 mg Oral Daily  . docusate sodium  100 mg Oral BID  . dorzolamide-timolol  1 drop Right Eye BID  . famotidine  20 mg Oral Daily  . finasteride  5 mg Oral  Daily  . folic acid  1 mg Oral Daily  . hydrALAZINE  25 mg Oral Q6H  . insulin aspart  0-15 Units Subcutaneous TID WC  . insulin aspart  0-5 Units Subcutaneous QHS  . insulin glargine  20 Units Subcutaneous Daily  . magnesium oxide  400 mg Oral Daily  . metoprolol tartrate  12.5 mg Oral BID  . mycophenolate  360 mg Oral BID  . NIFEdipine  60 mg Oral Daily  . [START ON 01/24/2020] predniSONE  10 mg Oral Q breakfast  . [START ON 01/21/2020] predniSONE  20 mg Oral Q breakfast  . [START ON 01/18/2020] predniSONE  30 mg Oral Q breakfast  . [START ON 01/15/2020] predniSONE  40 mg Oral Q breakfast  . [START ON 01/12/2020] predniSONE  50 mg Oral Q breakfast  . predniSONE  60 mg Oral Q breakfast  . sulfamethoxazole-trimethoprim  1 tablet Oral Q M,W,F  . tacrolimus  2 mg Oral BID  . tamsulosin  0.4 mg Oral QPC breakfast   Continuous Infusions:   LOS: 10 days    Time spent: 25 mins.    Shawna Clamp, MD [facility]   If 7PM-7AM, please contact night-coverage

## 2020-01-09 NOTE — Progress Notes (Signed)
Physical Therapy Treatment Patient Details Name: Ryan Rice MRN: [id number] DOB: 08-03-1951 Today's Date: 01/09/2020    History of Present Illness 69 y.o. male with medical history significant of PVD; renal transplant (2013); HTN; DM; and afib presenting with AMS. Pt reporting some expressive aphasia, tremors, urinary incontinence. Pt also reports L hip and leg pain for a few months. 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 Treatment in place for infarct vs neoplasm    PT Comments    Patient presents with changes in cognition, speech, and mobility compared to previous session. RN notified. Pt unable to state name and with more frequent "word salad". Pt able to ambulate ~100 ft with min-mod A and use of bilat UE support.  Frequent loose stools so incontinence underwear used for ambulation. RN assessed skin during session as pt noted to have skin breakdown. Continue to progress as tolerated and recommend CIR for further skilled PT services to maximize independence and safety with mobility.    Follow Up Recommendations  Supervision/Assistance - 24 hour;CIR     Equipment Recommendations  None recommended by PT    Recommendations for Other Services       Precautions / Restrictions Precautions Precautions: Fall Restrictions Weight Bearing Restrictions: No    Mobility  Bed Mobility Overal bed mobility: Needs Assistance Bed Mobility: Supine to Sit     Supine to sit: Min assist     General bed mobility comments: multimodal cues and assist to bring bilat LE to EOB and to elevate trunk into sitting   Transfers Overall transfer level: Needs assistance Equipment used: 1 person hand held assist Transfers: Sit to/from Stand Sit to Stand: Min assist         General transfer comment: assist to  power up and to steady; pt stood several times due to bowel incontinence X 2 during session and standing for hygiene  Ambulation/Gait Ambulation/Gait assistance: Min assist;Mod assist Gait Distance (Feet): 100 Feet Assistive device: 1 person hand held assist;Rolling walker (2 wheeled) Gait Pattern/deviations: Trunk flexed;Step-through pattern;Decreased stride length;Drifts right/left;Antalgic;Decreased stance time - left;Decreased step length - right;Wide base of support     General Gait Details: initially HHA however pt requiring increased assistance for balance and with very flexed posture; pt able to correct posture briefly with cues; assistance for balance and for managing RW as pt tends to keep RW too far away    Stairs             Wheelchair Mobility    Modified Rankin (Stroke Patients Only) Modified Rankin (Stroke Patients Only) Modified Rankin: Moderately severe disability     Balance Overall balance assessment: Needs assistance Sitting-balance support: No upper extremity supported;Feet supported Sitting balance-Leahy Scale: Good     Standing balance support: Single extremity supported;During functional activity;Bilateral upper extremity supported Standing balance-Leahy Scale: Poor                              Cognition Arousal/Alertness: Awake/alert Behavior During Therapy: Impulsive Overall Cognitive Status: Impaired/Different from baseline                         Following Commands: Follows one step commands inconsistently;Follows one step commands with increased time Safety/Judgement: Decreased awareness of safety;Decreased awareness of deficits   Problem  Solving: Requires tactile cues;Difficulty sequencing;Requires verbal cues;Slow processing General Comments: pt unable to state name and with increased word salad this session; perseverating      Exercises      General Comments General comments (skin integrity, edema, etc.): pt  with bleeding on scrotum noted during pericare; RN notified and barrier cream applied after hygiene      Pertinent Vitals/Pain Pain Assessment: Faces Faces Pain Scale: Hurts [name] more Pain Location: L hip with mobility Pain Descriptors / Indicators: Guarding Pain Intervention(s): Limited activity within patient's tolerance;Monitored during session    Home Living                      Prior Function            PT Goals (current goals can now be found in the care plan section) Progress towards PT goals: Progressing toward goals    Frequency    Min 3X/week      PT Plan Current plan remains appropriate    Co-evaluation              AM-PAC PT "6 Clicks" Mobility   Outcome Measure  Help needed turning from your back to your side while in a flat bed without using bedrails?: None Help needed moving from lying on your back to sitting on the side of a flat bed without using bedrails?: None Help needed moving to and from a bed to a chair (including a wheelchair)?: A [place] Help needed standing up from a chair using your arms (e.g., wheelchair or bedside chair)?: A [place] Help needed to walk in hospital room?: A Lot Help needed climbing 3-5 steps with a railing? : A Lot 6 Click Score: 18    End of Session Equipment Utilized During Treatment: Gait belt Activity Tolerance: Patient tolerated treatment well Patient left: with call bell/phone within reach;in chair;with chair alarm set Nurse Communication: Mobility status PT Visit Diagnosis: Other abnormalities of gait and mobility (R26.89);Other symptoms and signs involving the nervous system (R29.898)     Time: [id number] PT Time Calculation (min) (ACUTE ONLY): 43 min  Charges:  $Gait Training: 23-37 mins $Therapeutic Activity: 8-22 mins                     [name], PTA Acute Rehabilitation Services Pager: [phone] Office: [phone]     [name] 01/09/2020, 4:37 PM

## 2020-01-10 ENCOUNTER — Other Ambulatory Visit: Payer: Self-pay

## 2020-01-10 ENCOUNTER — Encounter (HOSPITAL_COMMUNITY): Payer: Self-pay | Admitting: Physical Medicine & Rehabilitation

## 2020-01-10 ENCOUNTER — Inpatient Hospital Stay (HOSPITAL_COMMUNITY)
Admission: RE | Admit: 2020-01-10 | Discharge: 2020-01-25 | DRG: 092 | Disposition: A | Discharge status: Home Health | Payer: Medicare HMO | Source: Intra-hospital | Attending: Physical Medicine & Rehabilitation | Admitting: Physical Medicine & Rehabilitation

## 2020-01-10 DIAGNOSIS — N400 Enlarged prostate without lower urinary tract symptoms: Secondary | ICD-10-CM | POA: Diagnosis present

## 2020-01-10 DIAGNOSIS — I69320 Aphasia following cerebral infarction: Secondary | ICD-10-CM

## 2020-01-10 DIAGNOSIS — Z7984 Long term (current) use of oral hypoglycemic drugs: Secondary | ICD-10-CM

## 2020-01-10 DIAGNOSIS — T380X5A Adverse effect of glucocorticoids and synthetic analogues, initial encounter: Secondary | ICD-10-CM | POA: Diagnosis present

## 2020-01-10 DIAGNOSIS — R2689 Other abnormalities of gait and mobility: Principal | ICD-10-CM | POA: Diagnosis present

## 2020-01-10 DIAGNOSIS — D696 Thrombocytopenia, unspecified: Secondary | ICD-10-CM | POA: Diagnosis not present

## 2020-01-10 DIAGNOSIS — M25551 Pain in right hip: Secondary | ICD-10-CM | POA: Diagnosis present

## 2020-01-10 DIAGNOSIS — I482 Chronic atrial fibrillation, unspecified: Secondary | ICD-10-CM | POA: Diagnosis present

## 2020-01-10 DIAGNOSIS — Z86718 Personal history of other venous thrombosis and embolism: Secondary | ICD-10-CM | POA: Diagnosis not present

## 2020-01-10 DIAGNOSIS — B965 Pseudomonas (aeruginosa) (mallei) (pseudomallei) as the cause of diseases classified elsewhere: Secondary | ICD-10-CM | POA: Diagnosis present

## 2020-01-10 DIAGNOSIS — R739 Hyperglycemia, unspecified: Secondary | ICD-10-CM

## 2020-01-10 DIAGNOSIS — Z823 Family history of stroke: Secondary | ICD-10-CM

## 2020-01-10 DIAGNOSIS — E1165 Type 2 diabetes mellitus with hyperglycemia: Secondary | ICD-10-CM | POA: Diagnosis present

## 2020-01-10 DIAGNOSIS — I639 Cerebral infarction, unspecified: Secondary | ICD-10-CM | POA: Diagnosis not present

## 2020-01-10 DIAGNOSIS — I69328 Other speech and language deficits following cerebral infarction: Secondary | ICD-10-CM

## 2020-01-10 DIAGNOSIS — Z79899 Other long term (current) drug therapy: Secondary | ICD-10-CM

## 2020-01-10 DIAGNOSIS — N390 Urinary tract infection, site not specified: Secondary | ICD-10-CM | POA: Diagnosis present

## 2020-01-10 DIAGNOSIS — E1151 Type 2 diabetes mellitus with diabetic peripheral angiopathy without gangrene: Secondary | ICD-10-CM | POA: Diagnosis present

## 2020-01-10 DIAGNOSIS — I749 Embolism and thrombosis of unspecified artery: Secondary | ICD-10-CM

## 2020-01-10 DIAGNOSIS — E8770 Fluid overload, unspecified: Secondary | ICD-10-CM | POA: Diagnosis not present

## 2020-01-10 DIAGNOSIS — Z940 Kidney transplant status: Secondary | ICD-10-CM

## 2020-01-10 DIAGNOSIS — I69319 Unspecified symptoms and signs involving cognitive functions following cerebral infarction: Secondary | ICD-10-CM | POA: Diagnosis not present

## 2020-01-10 DIAGNOSIS — Z833 Family history of diabetes mellitus: Secondary | ICD-10-CM | POA: Diagnosis not present

## 2020-01-10 DIAGNOSIS — Z91041 Radiographic dye allergy status: Secondary | ICD-10-CM

## 2020-01-10 DIAGNOSIS — R609 Edema, unspecified: Secondary | ICD-10-CM

## 2020-01-10 DIAGNOSIS — Z87891 Personal history of nicotine dependence: Secondary | ICD-10-CM

## 2020-01-10 DIAGNOSIS — E871 Hypo-osmolality and hyponatremia: Secondary | ICD-10-CM

## 2020-01-10 DIAGNOSIS — I10 Essential (primary) hypertension: Secondary | ICD-10-CM | POA: Diagnosis present

## 2020-01-10 DIAGNOSIS — J309 Allergic rhinitis, unspecified: Secondary | ICD-10-CM | POA: Diagnosis present

## 2020-01-10 DIAGNOSIS — Z8249 Family history of ischemic heart disease and other diseases of the circulatory system: Secondary | ICD-10-CM | POA: Diagnosis not present

## 2020-01-10 DIAGNOSIS — D72819 Decreased white blood cell count, unspecified: Secondary | ICD-10-CM

## 2020-01-10 DIAGNOSIS — D709 Neutropenia, unspecified: Secondary | ICD-10-CM | POA: Diagnosis not present

## 2020-01-10 DIAGNOSIS — D496 Neoplasm of unspecified behavior of brain: Secondary | ICD-10-CM | POA: Diagnosis not present

## 2020-01-10 DIAGNOSIS — E785 Hyperlipidemia, unspecified: Secondary | ICD-10-CM

## 2020-01-10 DIAGNOSIS — R0989 Other specified symptoms and signs involving the circulatory and respiratory systems: Secondary | ICD-10-CM

## 2020-01-10 DIAGNOSIS — D72829 Elevated white blood cell count, unspecified: Secondary | ICD-10-CM

## 2020-01-10 DIAGNOSIS — Z7901 Long term (current) use of anticoagulants: Secondary | ICD-10-CM

## 2020-01-10 DIAGNOSIS — Z91048 Other nonmedicinal substance allergy status: Secondary | ICD-10-CM

## 2020-01-10 LAB — GLUCOSE, CAPILLARY
Glucose-Capillary: 159 mg/dL — ABNORMAL HIGH (ref 70–99)
Glucose-Capillary: 111 mg/dL — ABNORMAL HIGH (ref 70–99)
Glucose-Capillary: 177 mg/dL — ABNORMAL HIGH (ref 70–99)
Glucose-Capillary: 145 mg/dL — ABNORMAL HIGH (ref 70–99)

## 2020-01-10 LAB — BASIC METABOLIC PANEL
Anion gap: 9 (ref 5–15)
BUN: 56 mg/dL — ABNORMAL HIGH (ref 8–23)
CO2: 20 mmol/L — ABNORMAL LOW (ref 22–32)
Calcium: 8.5 mg/dL — ABNORMAL LOW (ref 8.9–10.3)
Chloride: 102 mmol/L (ref 98–111)
Creatinine, Ser: 1.69 mg/dL — ABNORMAL HIGH (ref 0.61–1.24)
GFR calc Af Amer: 47 mL/min — ABNORMAL LOW (ref >60)
GFR calc non Af Amer: 41 mL/min — ABNORMAL LOW (ref >60)
Glucose, Bld: 165 mg/dL — ABNORMAL HIGH (ref 70–99)
Potassium: 5 mmol/L (ref 3.5–5.1)
Sodium: 131 mmol/L — ABNORMAL LOW (ref 135–145)

## 2020-01-10 MED ORDER — PREDNISONE 20 MG PO TABS
50.0000 mg | ORAL_TABLET | Freq: Every day | ORAL | Status: AC
  Administered 2020-01-12 – 2020-01-14 (×3): 50 mg via ORAL
  Filled 2020-01-10 (×3): qty 2

## 2020-01-10 MED ORDER — MAGNESIUM OXIDE 400 (241.3 MG) MG PO TABS
400.0000 mg | ORAL_TABLET | Freq: Every day | ORAL | Status: DC
  Administered 2020-01-11 – 2020-01-25 (×15): 400 mg via ORAL
  Filled 2020-01-10 (×15): qty 1

## 2020-01-10 MED ORDER — ACETAMINOPHEN 325 MG PO TABS
325.0000 mg | ORAL_TABLET | ORAL | Status: DC | PRN
  Administered 2020-01-24: 650 mg via ORAL
  Filled 2020-01-10 (×2): qty 2

## 2020-01-10 MED ORDER — PREDNISONE 20 MG PO TABS
40.0000 mg | ORAL_TABLET | Freq: Every day | ORAL | Status: AC
  Administered 2020-01-15 – 2020-01-17 (×3): 40 mg via ORAL
  Filled 2020-01-10 (×3): qty 2

## 2020-01-10 MED ORDER — PREDNISONE 50 MG PO TABS: ORAL_TABLET | ORAL | 0 refills | Status: DC

## 2020-01-10 MED ORDER — ONDANSETRON HCL 4 MG PO TABS: 4.0000 mg | ORAL_TABLET | Freq: Four times a day (QID) | ORAL | Status: DC | PRN

## 2020-01-10 MED ORDER — SODIUM CHLORIDE 0.9 % IV SOLN: INTRAVENOUS | Status: DC

## 2020-01-10 MED ORDER — FOLIC ACID 1 MG PO TABS
1.0000 mg | ORAL_TABLET | Freq: Every day | ORAL | Status: DC
  Administered 2020-01-11 – 2020-01-25 (×15): 1 mg via ORAL
  Filled 2020-01-10 (×15): qty 1

## 2020-01-10 MED ORDER — FLEET ENEMA 7-19 GM/118ML RE ENEM: 1.0000 | ENEMA | Freq: Once | RECTAL | Status: DC | PRN

## 2020-01-10 MED ORDER — ATORVASTATIN CALCIUM 80 MG PO TABS
80.0000 mg | ORAL_TABLET | Freq: Every day | ORAL | Status: DC
  Administered 2020-01-10 – 2020-01-24 (×15): 80 mg via ORAL
  Filled 2020-01-10 (×15): qty 1

## 2020-01-10 MED ORDER — POLYETHYLENE GLYCOL 3350 17 G PO PACK: 17.0000 g | PACK | Freq: Every day | ORAL | Status: DC | PRN

## 2020-01-10 MED ORDER — GUAIFENESIN-DM 100-10 MG/5ML PO SYRP: 5.0000 mL | ORAL_SOLUTION | Freq: Four times a day (QID) | ORAL | Status: DC | PRN

## 2020-01-10 MED ORDER — DIPHENHYDRAMINE HCL 12.5 MG/5ML PO ELIX: 12.5000 mg | ORAL_SOLUTION | Freq: Four times a day (QID) | ORAL | Status: DC | PRN

## 2020-01-10 MED ORDER — INSULIN ASPART 100 UNIT/ML ~~LOC~~ SOLN
0.0000 [IU] | Freq: Three times a day (TID) | SUBCUTANEOUS | Status: DC
  Administered 2020-01-10 – 2020-01-11 (×2): 3 [IU] via SUBCUTANEOUS
  Administered 2020-01-11: 2 [IU] via SUBCUTANEOUS
  Administered 2020-01-12: 3 [IU] via SUBCUTANEOUS
  Administered 2020-01-12: 5 [IU] via SUBCUTANEOUS
  Administered 2020-01-12: 2 [IU] via SUBCUTANEOUS
  Administered 2020-01-13 (×2): 5 [IU] via SUBCUTANEOUS
  Administered 2020-01-13: 3 [IU] via SUBCUTANEOUS
  Administered 2020-01-14: 5 [IU] via SUBCUTANEOUS
  Administered 2020-01-14 – 2020-01-15 (×5): 3 [IU] via SUBCUTANEOUS
  Administered 2020-01-16 (×3): 5 [IU] via SUBCUTANEOUS
  Administered 2020-01-17: 3 [IU] via SUBCUTANEOUS
  Administered 2020-01-17: 13:00:00 5 [IU] via SUBCUTANEOUS
  Administered 2020-01-17: 8 [IU] via SUBCUTANEOUS
  Administered 2020-01-18: 2 [IU] via SUBCUTANEOUS
  Administered 2020-01-18 – 2020-01-20 (×6): 3 [IU] via SUBCUTANEOUS
  Administered 2020-01-21: 2 [IU] via SUBCUTANEOUS
  Administered 2020-01-21 (×2): 5 [IU] via SUBCUTANEOUS
  Administered 2020-01-22: 3 [IU] via SUBCUTANEOUS
  Administered 2020-01-22: 2 [IU] via SUBCUTANEOUS
  Administered 2020-01-22: 18:00:00 3 [IU] via SUBCUTANEOUS
  Administered 2020-01-23: 5 [IU] via SUBCUTANEOUS
  Administered 2020-01-23: 3 [IU] via SUBCUTANEOUS
  Administered 2020-01-23: 2 [IU] via SUBCUTANEOUS
  Administered 2020-01-24: 18:00:00 3 [IU] via SUBCUTANEOUS
  Administered 2020-01-24: 12:00:00 2 [IU] via SUBCUTANEOUS

## 2020-01-10 MED ORDER — FINASTERIDE 5 MG PO TABS
5.0000 mg | ORAL_TABLET | Freq: Every day | ORAL | Status: DC
  Administered 2020-01-11 – 2020-01-25 (×15): 5 mg via ORAL
  Filled 2020-01-10 (×15): qty 1

## 2020-01-10 MED ORDER — PREDNISONE 20 MG PO TABS
60.0000 mg | ORAL_TABLET | Freq: Every day | ORAL | Status: AC
  Administered 2020-01-11: 09:00:00 60 mg via ORAL
  Filled 2020-01-10: qty 3

## 2020-01-10 MED ORDER — HYDRALAZINE HCL 25 MG PO TABS
25.0000 mg | ORAL_TABLET | Freq: Four times a day (QID) | ORAL | Status: DC
  Administered 2020-01-10 – 2020-01-25 (×60): 25 mg via ORAL
  Filled 2020-01-10 (×60): qty 1

## 2020-01-10 MED ORDER — HYDROCODONE-ACETAMINOPHEN 5-325 MG PO TABS: 1.0000 | ORAL_TABLET | ORAL | Status: DC | PRN

## 2020-01-10 MED ORDER — METOPROLOL TARTRATE 12.5 MG HALF TABLET
12.5000 mg | ORAL_TABLET | Freq: Two times a day (BID) | ORAL | Status: DC
  Administered 2020-01-10 – 2020-01-25 (×30): 12.5 mg via ORAL
  Filled 2020-01-10 (×30): qty 1

## 2020-01-10 MED ORDER — SODIUM CHLORIDE 0.9 % IV SOLN
INTRAVENOUS | Status: DC
  Administered 2020-01-11 (×2): 1000 mL via INTRAVENOUS

## 2020-01-10 MED ORDER — SULFAMETHOXAZOLE-TRIMETHOPRIM 400-80 MG PO TABS
1.0000 | ORAL_TABLET | ORAL | Status: DC
  Administered 2020-01-13 – 2020-01-24 (×6): 1 via ORAL
  Filled 2020-01-10 (×6): qty 1

## 2020-01-10 MED ORDER — ARTIFICIAL TEARS OPHTHALMIC OINT
TOPICAL_OINTMENT | Freq: Every evening | OPHTHALMIC | Status: DC | PRN
  Filled 2020-01-10: qty 3.5

## 2020-01-10 MED ORDER — DOCUSATE SODIUM 100 MG PO CAPS
100.0000 mg | ORAL_CAPSULE | Freq: Two times a day (BID) | ORAL | Status: DC
  Administered 2020-01-10 – 2020-01-25 (×30): 100 mg via ORAL
  Filled 2020-01-10 (×30): qty 1

## 2020-01-10 MED ORDER — TRAZODONE HCL 50 MG PO TABS
25.0000 mg | ORAL_TABLET | Freq: Every evening | ORAL | Status: DC | PRN
  Administered 2020-01-12 – 2020-01-24 (×3): 50 mg via ORAL
  Filled 2020-01-10 (×3): qty 1

## 2020-01-10 MED ORDER — PREDNISONE 20 MG PO TABS: 60.0000 mg | ORAL_TABLET | Freq: Every day | ORAL | 0 refills | Status: DC

## 2020-01-10 MED ORDER — CINACALCET HCL 30 MG PO TABS
60.0000 mg | ORAL_TABLET | Freq: Every day | ORAL | Status: DC
  Administered 2020-01-11 – 2020-01-25 (×15): 60 mg via ORAL
  Filled 2020-01-10 (×15): qty 2

## 2020-01-10 MED ORDER — TACROLIMUS 1 MG PO CAPS
2.0000 mg | ORAL_CAPSULE | Freq: Two times a day (BID) | ORAL | Status: DC
  Administered 2020-01-10 – 2020-01-25 (×30): 2 mg via ORAL
  Filled 2020-01-10 (×31): qty 2

## 2020-01-10 MED ORDER — NIFEDIPINE ER OSMOTIC RELEASE 60 MG PO TB24
60.0000 mg | ORAL_TABLET | Freq: Every day | ORAL | Status: DC
  Administered 2020-01-11 – 2020-01-25 (×15): 60 mg via ORAL
  Filled 2020-01-10 (×15): qty 1

## 2020-01-10 MED ORDER — ONDANSETRON HCL 4 MG/2ML IJ SOLN: 4.0000 mg | Freq: Four times a day (QID) | INTRAMUSCULAR | Status: DC | PRN

## 2020-01-10 MED ORDER — ZOLPIDEM TARTRATE 5 MG PO TABS: 5.0000 mg | ORAL_TABLET | Freq: Every evening | ORAL | Status: DC | PRN

## 2020-01-10 MED ORDER — PREDNISONE 10 MG PO TABS: 30.0000 mg | ORAL_TABLET | Freq: Every day | ORAL | 0 refills | Status: DC

## 2020-01-10 MED ORDER — MYCOPHENOLATE SODIUM 180 MG PO TBEC
360.0000 mg | DELAYED_RELEASE_TABLET | Freq: Two times a day (BID) | ORAL | Status: DC
  Administered 2020-01-10 – 2020-01-25 (×30): 360 mg via ORAL
  Filled 2020-01-10 (×30): qty 2

## 2020-01-10 MED ORDER — INSULIN ASPART 100 UNIT/ML ~~LOC~~ SOLN
0.0000 [IU] | Freq: Every day | SUBCUTANEOUS | Status: DC
  Administered 2020-01-11 – 2020-01-13 (×3): 2 [IU] via SUBCUTANEOUS
  Administered 2020-01-15 – 2020-01-16 (×2): 3 [IU] via SUBCUTANEOUS
  Administered 2020-01-17 – 2020-01-21 (×5): 2 [IU] via SUBCUTANEOUS
  Administered 2020-01-22: 3 [IU] via SUBCUTANEOUS
  Administered 2020-01-24: 2 [IU] via SUBCUTANEOUS

## 2020-01-10 MED ORDER — PREDNISONE 10 MG PO TABS
10.0000 mg | ORAL_TABLET | Freq: Every day | ORAL | Status: DC
  Administered 2020-01-24 – 2020-01-25 (×2): 10 mg via ORAL
  Filled 2020-01-10 (×2): qty 1

## 2020-01-10 MED ORDER — INSULIN GLARGINE 100 UNIT/ML ~~LOC~~ SOLN
20.0000 [IU] | Freq: Every day | SUBCUTANEOUS | Status: DC
  Administered 2020-01-11 – 2020-01-25 (×15): 20 [IU] via SUBCUTANEOUS
  Filled 2020-01-10 (×16): qty 0.2

## 2020-01-10 MED ORDER — PREDNISONE 20 MG PO TABS: 20.0000 mg | ORAL_TABLET | Freq: Every day | ORAL | 0 refills | Status: DC

## 2020-01-10 MED ORDER — BRIMONIDINE TARTRATE 0.2 % OP SOLN
1.0000 [drp] | Freq: Two times a day (BID) | OPHTHALMIC | Status: DC
  Administered 2020-01-10 – 2020-01-25 (×30): 1 [drp] via OPHTHALMIC
  Filled 2020-01-10: qty 5

## 2020-01-10 MED ORDER — DORZOLAMIDE HCL-TIMOLOL MAL 2-0.5 % OP SOLN
1.0000 [drp] | Freq: Two times a day (BID) | OPHTHALMIC | Status: DC
  Administered 2020-01-10 – 2020-01-25 (×29): 1 [drp] via OPHTHALMIC
  Filled 2020-01-10: qty 10

## 2020-01-10 MED ORDER — APIXABAN 5 MG PO TABS
5.0000 mg | ORAL_TABLET | Freq: Two times a day (BID) | ORAL | Status: DC
  Administered 2020-01-10 – 2020-01-25 (×30): 5 mg via ORAL
  Filled 2020-01-10 (×30): qty 1

## 2020-01-10 MED ORDER — FAMOTIDINE 20 MG PO TABS
20.0000 mg | ORAL_TABLET | Freq: Every day | ORAL | Status: DC
  Administered 2020-01-11 – 2020-01-25 (×15): 20 mg via ORAL
  Filled 2020-01-10 (×15): qty 1

## 2020-01-10 MED ORDER — PREDNISONE 20 MG PO TABS: 40.0000 mg | ORAL_TABLET | Freq: Every day | ORAL | 0 refills | Status: DC

## 2020-01-10 MED ORDER — PREDNISONE 20 MG PO TABS
20.0000 mg | ORAL_TABLET | Freq: Every day | ORAL | Status: AC
  Administered 2020-01-21 – 2020-01-23 (×3): 20 mg via ORAL
  Filled 2020-01-10 (×3): qty 1

## 2020-01-10 MED ORDER — PREDNISONE 20 MG PO TABS
30.0000 mg | ORAL_TABLET | Freq: Every day | ORAL | Status: AC
  Administered 2020-01-18 – 2020-01-20 (×3): 30 mg via ORAL
  Filled 2020-01-10 (×3): qty 1

## 2020-01-10 MED ORDER — BISACODYL 10 MG RE SUPP: 10.0000 mg | Freq: Every day | RECTAL | Status: DC | PRN

## 2020-01-10 MED ORDER — ALUM & MAG HYDROXIDE-SIMETH 200-200-20 MG/5ML PO SUSP: 30.0000 mL | ORAL | Status: DC | PRN

## 2020-01-10 MED ORDER — TAMSULOSIN HCL 0.4 MG PO CAPS
0.4000 mg | ORAL_CAPSULE | Freq: Every day | ORAL | Status: DC
  Administered 2020-01-11 – 2020-01-25 (×15): 0.4 mg via ORAL
  Filled 2020-01-10 (×15): qty 1

## 2020-01-10 NOTE — Progress Notes (Signed)
Ryan Heys, MD  Physician  Physical Medicine and Rehabilitation  Consult Note      Signed  Date of Service:  01/07/2020  9:21 AM      Related encounter: ED to Hosp-Admission (Discharged) from 12/29/2019 in [facility]      Signed      Expand AllCollapse All   Show:Clear all [x] Manual[x] Template[] Copied  Added by: [x] Love, Ivan Anchors, PA-C[x] Ryan Heys, MD  [] Hover for details          Physical Medicine and Rehabilitation Consult     Reason for Consult: Functional deficits.  Referring Physician: Dr. Benny Lennert     HPI: Ryan Rice is a 69 y.o. RH-male with history of PVD, renal transplant, CAF, BPH, HTN, who was admitted on 12/30/19 with staring episodes, decrease in verbal output and stuttering.  Patient on chronic coumadin and had been off coumadin in anticipation of GU procedure.  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 EEG without seizure activity and Dr.Cabbell consulted with plans for biopsy on 04/08.  for input on biopsy. Follow up MRI brain on 04/08 showed stable anterior left frontal lobe with hyperintense flare suggesting blood products and new confluent areas of diffusion abnormality left hemisphere gyriform with small areas of chronic encephalomalacia question subacute infarct with petechial hemorrhage or venous infarcts.  Surgery cancelled and  [name] consulted for input. He felt that patient with L-ACA infarcts with hemorrhagic conversion as well as L-MCA infarcts--embolic due to A fib and lack of coumadin. He did not feel that brain biopsy was indicated.    Follow up CT head showed stable evolving L-ACA and left temporoparietal and occipital infarcts. MRV brain was negative for venous sinus thrombosis with hypoplastic left transverse sinus.    2D echo showed EF 55-60% and bilateral atrial dilatation. Carotid dopplers without significant ICA stenosis.   Nephrology consulted for acute on chronic renal failure with hyperkalemia and he was started on IVF for hydration and renal ultrasound ordered for work up. Therapy ongoing  revealing expressive and receptive language deficits with verbal perseveration and phonemic paraphasias, left visual field deficits  and antalgic gait with poor safety awareness. CIR recommended due to functional deficits.        Pt isn't sure when had his LBM, but feels constipated. Denies pain Admits to smoking occ cigarettes still Is emphatic this is "an adult child" that could help him for rehab.    Review of Systems  Constitutional: Negative for chills and fever.  HENT: Negative for hearing loss and tinnitus.   Eyes: Positive for blurred vision and double vision.  Respiratory: Negative for cough and shortness of breath.   Cardiovascular: Negative for chest pain and leg swelling.  Gastrointestinal: Negative for abdominal pain, heartburn and nausea.  Musculoskeletal: Positive for joint pain (bilateral knee pain).  Skin: Negative for rash.  Neurological: Positive for sensory change (RLE>LLE) and weakness. Negative for dizziness.  Psychiatric/Behavioral: The patient is not nervous/anxious.   All other systems reviewed and are negative.         Past Medical History:  Diagnosis Date  . Allergic rhinitis    . BPH (benign prostatic hyperplasia)    . Chronic atrial fibrillation ([place])    . Diabetes mellitus ([place])    . DVT (deep venous thrombosis) ([facility])      LLE  . Hematochezia    . HTN (hypertension)    . Hypomagnesemia 03/20/2019  . Kidney  transplant recipient    . PVD (peripheral vascular disease) ([place])             Past Surgical History:  Procedure Laterality Date  . KIDNEY TRANSPLANT               Family History  Problem Relation Age of Onset  . Stroke Mother    . Heart disease Father    . Diabetes Father    . Diabetes Sister    . Diabetes Brother    . Heart disease Brother        Social  History: Married. Independent with cane PTA. Per  reports that he quit smoking about 35 years ago. He quit smokeless tobacco use about 6 years ago. He reports current alcohol use. He reports that he does not use drugs.          Allergies  Allergen Reactions  . Iodinated Diagnostic Agents Hives  . Tape Hives            Medications Prior to Admission  Medication Sig Dispense Refill  . artificial tears (LACRILUBE) OINT ophthalmic ointment Place into the left eye at bedtime as needed for dry eyes. 1 Tube 1  . atorvastatin (LIPITOR) 10 MG tablet TAKE 1 TABLET (10 MG TOTAL) BY MOUTH DAILY AT 6 PM. 30 tablet 0  . brimonidine (ALPHAGAN) 0.2 % ophthalmic solution Place 1 drop into the right eye 2 (two) times daily.      . cinacalcet (SENSIPAR) 60 MG tablet Take 60 mg by mouth daily.      . diclofenac Sodium (VOLTAREN) 1 % GEL Apply 4 g topically 2 (two) times daily. Apply to hips and legs      . dorzolamide-timolol (COSOPT) 22.3-6.8 MG/ML ophthalmic solution Place 1 drop into the right eye 2 (two) times a day.      . famotidine (PEPCID) 20 MG tablet Take 1 tablet (20 mg total) by mouth daily. 30 tablet 1  . finasteride (PROSCAR) 5 MG tablet Take 5 mg by mouth daily.      . folic acid (FOLVITE) 1 MG tablet TAKE 1 TABLET BY MOUTH EVERY DAY (Patient taking differently: Take 1 mg by mouth daily. ) 30 tablet 0  . JANUVIA 100 MG tablet Take 100 mg by mouth daily.      Marland Kitchen labetalol (NORMODYNE) 100 MG tablet Take 1 tablet (100 mg total) by mouth 2 (two) times daily.      . magnesium oxide (MAG-OX) 400 MG tablet Take 400 mg by mouth daily.      . metFORMIN (GLUCOPHAGE-XR) 500 MG 24 hr tablet Take 500 mg by mouth 2 (two) times daily.      . mycophenolate (MYFORTIC) 180 MG EC tablet Take 360 mg by mouth 2 (two) times daily.       . nateglinide (STARLIX) 120 MG tablet Take 120 mg by mouth 2 (two) times daily before a meal. On hold if BG is < 130      . NIFEdipine (ADALAT CC) 60 MG 24 hr tablet Take 60 mg by  mouth daily.      . pioglitazone (ACTOS) 30 MG tablet Take 15 mg by mouth daily.      Marland Kitchen sulfamethoxazole-trimethoprim (BACTRIM) 400-80 MG tablet Take 1 tablet by mouth every Monday, Wednesday, and Friday.       . tacrolimus (PROGRAF) 1 MG capsule Take 2 mg by mouth 2 (two) times daily.       . tamsulosin (FLOMAX) 0.4 MG CAPS  capsule Take 0.4 mg by mouth daily after breakfast.       . warfarin (COUMADIN) 5 MG tablet Take 2.5-5 mg by mouth See admin instructions. Take 2.5mg  on MON Wed THUR FRI SAT, then take 5mg  on Tues and SUN.      . [DISCONTINUED] predniSONE (DELTASONE) 10 MG tablet Take 10 mg by mouth daily with breakfast.          Home: Home Living Family/patient expects to be discharged to:: Private residence Living Arrangements: Spouse/significant other Available Help at Discharge: Available 24 hours/day, Family Type of Home: House Home Access: Stairs to enter, Other (comment) Entrance Stairs-Number of Steps: 1STE back door and able ot use RW (main entryway) and 5STE without handrails on front enttrance Entrance Stairs-Rails: None Home Layout: One level Bathroom Shower/Tub: Gaffer, Chiropodist: Standard Home Equipment: Environmental consultant - 2 wheels Additional Comments: no DME  Lives With: Spouse, Family(wife and granddaughter )  Functional History: Prior Function Level of Independence: Independent Comments: enjoys fishing Functional Status:  Mobility: Bed Mobility Overal bed mobility: Modified Independent General bed mobility comments: requires supervision for impulsivity Transfers Overall transfer level: Needs assistance Equipment used: Rolling walker (2 wheeled) Transfers: Sit to/from Stand Sit to Stand: Min guard General transfer comment: min guard for safety Ambulation/Gait Ambulation/Gait assistance: Min guard, Min assist, Mod assist Gait Distance (Feet): (80 ft X 2 trials (with and without AD)) Assistive device: Rolling walker (2 wheeled), None(pt  using rail, other objects and assist at trunk w/gait belt) Gait Pattern/deviations: Trunk flexed, Step-through pattern, Decreased stride length, Drifts right/left General Gait Details: assistance required for balance without use of AD and min guard assist for safety with use of RW; pt with unsafe gait speeds at times and max directional cues  Gait velocity: varying  Gait velocity interpretation: <1.8 ft/sec, indicate of risk for recurrent falls Stairs: Yes Stairs assistance: Min assist Stair Management: Two rails, Alternating pattern, Forwards Number of Stairs: (3 steps then 2 shorter steps) General stair comments: cues for safety and step to pattern; pt alternating pattern despite cues   ADL: ADL Overall ADL's : Needs assistance/impaired Eating/Feeding: Set up, Bed level Grooming: Wash/dry hands, Wash/dry face, Oral care, Min guard, Minimal assistance, Standing, Cueing for safety Grooming Details (indicate cue type and reason): Completed in standing, no LOB, but ataxic movement noted throughout Lower Body Bathing: Min guard, Minimal assistance Lower Body Bathing Details (indicate cue type and reason): Completed in standing at sink, min A for safety and balance Toilet Transfer: Min guard, Cueing for safety, Cueing for sequencing Toilet Transfer Details (indicate cue type and reason): Simulated with transfer to recliner after functional ambulation Functional mobility during ADLs: Min guard, Minimal assistance General ADL Comments: Pt continues to demonstrate cognitive deficits when completing trail mapping tasks, pt refused ADLs this date   Cognition: Cognition Overall Cognitive Status: Impaired/Different from baseline Arousal/Alertness: Awake/alert Orientation Level: Disoriented to situation, Disoriented to time, Disoriented to place, Oriented to person Attention: Sustained Sustained Attention: Impaired Sustained Attention Impairment: Verbal complex Memory: Impaired Memory  Impairment: Decreased short term memory Decreased Short Term Memory: Verbal basic Immediate Memory Recall: Sock, Blue, Bed Memory Recall Sock: With Cue Memory Recall Blue: With Cue Memory Recall Bed: Without Cue Awareness: Impaired Awareness Impairment: Intellectual impairment Problem Solving: Impaired Problem Solving Impairment: Verbal complex, Verbal basic Executive Function: Reasoning Reasoning: Impaired Reasoning Impairment: Verbal complex Behaviors: Perseveration Safety/Judgment: Impaired Cognition Arousal/Alertness: Awake/alert Behavior During Therapy: Impulsive, Flat affect Overall Cognitive Status: Impaired/Different from baseline Area of  Impairment: Attention, Memory, Following commands, Safety/judgement, Problem solving, Orientation Orientation Level: Time, Situation Current Attention Level: Sustained Memory: Decreased short-term memory Following Commands: Follows one step commands inconsistently Safety/Judgement: Decreased awareness of safety, Decreased awareness of deficits Awareness: Emergent Problem Solving: Slow processing, Requires tactile cues, Difficulty sequencing, Requires verbal cues General Comments: unable to perform standardized balance assessment due to pt's inability to follow commands; pt urinated into cup on table instead of using urinal on bed rail and unaware that bed was soiled Difficult to assess due to: (aphasia)     Blood pressure (!) 161/70, pulse (!) 53, temperature 97.6 F (36.4 C), temperature source Oral, resp. rate 18, height 5\' 11"  (1.803 m), weight 86.6 kg, SpO2 100 %. Physical Exam  Nursing note and vitals reviewed. Constitutional: He is oriented to person, place, and time. He appears well-developed and well-nourished. No distress.  Pt sitting up in bed; appropriate, appears comfortable, NAD  HENT:  Head: Normocephalic and atraumatic.  Nose: Nose normal.  Mouth/Throat: Oropharynx is clear and moist. No oropharyngeal exudate.  No  facial droop Tongue midline No facial sensation change  Eyes: Conjunctivae are normal.  Unable to get L eye to medline completely with EOMs No nystagmus seen Appears to have possible visual field, but not clear due to aphasia  Neck: No tracheal deviation present.  Cardiovascular:  Pt in afib, rate controlled  Respiratory: No stridor.  CTA B/L- good air movement B/L  GI:  Soft, NT< slightly distended, normoactive BS  Musculoskeletal:        General: No edema.     Cervical back: Normal range of motion and neck supple.     Comments: RUE deltoid, biceps, triceps 5/5; grip 4/5, couldn't understand finger abd to test LUE 5/5 except finger abd- didn't test as above RLE- HF 4/5, KE/KF 4+/5, DF and PF 5-/5 LLE 5/5 in same muscles   Neurological: He is alert and oriented to person, place, and time.  Speech with minimal dysarthria. Expressive deficits with occasional perseveration requiring redirection. He was able to follow simple one step motor commands with occasional cues and unable to follow two step commands. Unable to point to items consistently without cues for redirection.   Could only follow 1 step directions with multiple cues Multiple paraphasias and word substitutions heard Unable to name apple, pen, glasses- perseverative  Skin: Skin is warm and dry. He is not diaphoretic.  Psychiatric:  Appropriate, calm      Lab Results Last 24 Hours       Results for orders placed or performed during the hospital encounter of 12/29/19 (from the past 24 hour(s))  Glucose, capillary     Status: Abnormal    Collection Time: 01/06/20 11:26 AM  Result Value Ref Range    Glucose-Capillary 227 (H) 70 - 99 mg/dL    Comment 1 Notify RN      Comment 2 Document in Chart    Troponin I (High Sensitivity)     Status: Abnormal    Collection Time: 01/06/20  2:07 PM  Result Value Ref Range    Troponin I (High Sensitivity) 30 (H) <18 ng/L  Troponin I (High Sensitivity)     Status: Abnormal     Collection Time: 01/06/20  3:13 PM  Result Value Ref Range    Troponin I (High Sensitivity) 25 (H) <18 ng/L  Glucose, capillary     Status: Abnormal    Collection Time: 01/06/20  3:59 PM  Result Value Ref Range    Glucose-Capillary  263 (H) 70 - 99 mg/dL    Comment 1 Notify RN      Comment 2 Document in Chart    Creatinine, urine, random     Status: None    Collection Time: 01/06/20  6:44 PM  Result Value Ref Range    Creatinine, Urine 44.46 mg/dL  Sodium, urine, random     Status: None    Collection Time: 01/06/20  6:44 PM  Result Value Ref Range    Sodium, Ur 24 mmol/L  Glucose, capillary     Status: Abnormal    Collection Time: 01/06/20  9:14 PM  Result Value Ref Range    Glucose-Capillary 155 (H) 70 - 99 mg/dL  Renal function panel     Status: Abnormal    Collection Time: 01/07/20  3:30 AM  Result Value Ref Range    Sodium 131 (L) 135 - 145 mmol/L    Potassium 4.7 3.5 - 5.1 mmol/L    Chloride 104 98 - 111 mmol/L    CO2 20 (L) 22 - 32 mmol/L    Glucose, Bld 118 (H) 70 - 99 mg/dL    BUN 51 (H) 8 - 23 mg/dL    Creatinine, Ser 1.18 0.61 - 1.24 mg/dL    Calcium 8.8 (L) 8.9 - 10.3 mg/dL    Phosphorus 2.6 2.5 - 4.6 mg/dL    Albumin 2.3 (L) 3.5 - 5.0 g/dL    GFR calc non Af Amer >60 >60 mL/min    GFR calc Af Amer >60 >60 mL/min    Anion gap 7 5 - 15  CBC with Differential/Platelet     Status: Abnormal    Collection Time: 01/07/20  3:30 AM  Result Value Ref Range    WBC 10.1 4.0 - 10.5 K/uL    RBC 5.41 4.22 - 5.81 MIL/uL    Hemoglobin 14.3 13.0 - 17.0 g/dL    HCT 44.1 39.0 - 52.0 %    MCV 81.5 80.0 - 100.0 fL    MCH 26.4 26.0 - 34.0 pg    MCHC 32.4 30.0 - 36.0 g/dL    RDW 14.4 11.5 - 15.5 %    Platelets 174 150 - 400 K/uL    nRBC 0.0 0.0 - 0.2 %    Neutrophils Relative % 89 %    Neutro Abs 9.0 (H) 1.7 - 7.7 K/uL    Lymphocytes Relative 4 %    Lymphs Abs 0.4 (L) 0.7 - 4.0 K/uL    Monocytes Relative 5 %    Monocytes Absolute 0.5 0.1 - 1.0 K/uL    Eosinophils Relative 0  %    Eosinophils Absolute 0.0 0.0 - 0.5 K/uL    Basophils Relative 0 %    Basophils Absolute 0.0 0.0 - 0.1 K/uL    Immature Granulocytes 2 %    Abs Immature Granulocytes 0.18 (H) 0.00 - 0.07 K/uL  Basic metabolic panel     Status: Abnormal    Collection Time: 01/07/20  3:30 AM  Result Value Ref Range    Sodium 132 (L) 135 - 145 mmol/L    Potassium 4.8 3.5 - 5.1 mmol/L    Chloride 104 98 - 111 mmol/L    CO2 21 (L) 22 - 32 mmol/L    Glucose, Bld 120 (H) 70 - 99 mg/dL    BUN 50 (H) 8 - 23 mg/dL    Creatinine, Ser 1.19 0.61 - 1.24 mg/dL    Calcium 8.9 8.9 - 10.3 mg/dL    GFR  calc non Af Amer >60 >60 mL/min    GFR calc Af Amer >60 >60 mL/min    Anion gap 7 5 - 15  Glucose, capillary     Status: Abnormal    Collection Time: 01/07/20  6:24 AM  Result Value Ref Range    Glucose-Capillary 136 (H) 70 - 99 mg/dL       Imaging Results (Last 48 hours)  CT HEAD WO CONTRAST   Result Date: 01/06/2020 CLINICAL DATA:  Stroke, follow-up. EXAM: CT HEAD WITHOUT CONTRAST TECHNIQUE: Contiguous axial images were obtained from the base of the skull through the vertex without intravenous contrast. COMPARISON:  Prior head CT examinations 01/04/2020 and earlier, brain MRI 01/02/2020, brain MRI 12/30/2019. FINDINGS: Brain: No significant change in size of a paramedian anterior left frontal lobe lesion. As before, there is associated petechial hemorrhage without frank hemorrhagic conversion. Also unchanged, there is partial effacement of the anterior horn of the left lateral ventricle. No midline shift. An evolving subacute ischemic infarct involving portions of the left temporoparietal and occipital lobes has not significantly changed in size. Question minimal associated petechial hemorrhage. No frank hemorrhagic conversion. No new demarcated cortical infarct is identified. Redemonstrated small chronic cortically based infarcts within the right parietal and occipital lobes. Stable background chronic small vessel  ischemic disease and generalized parenchymal atrophy again. No hydrocephalus. No extra-axial fluid collection. Vascular: No hyperdense vessel. Skull: Normal. Negative for fracture or focal lesion. Sinuses/Orbits: Visualized orbits demonstrate no acute abnormality. Minimal ethmoid sinus mucosal thickening. No significant mastoid effusion. IMPRESSION: 1. Unchanged size and appearance of a lesion involving the paramedian anterior left frontal lobe. Please refer to prior brain MRI 01/02/2020 for differential considerations. Unchanged petechial hemorrhage associated with this lesion. 2. An evolving subacute ischemic infarct involving portions of the left temporoparietal and occipital lobes has not significantly changed in size. There may be minimal associated petechial hemorrhage without frank hemorrhagic conversion. 3. No worsening mass effect or midline shift. 4. Redemonstrated chronic cortically based infarcts within the right parietal and occipital lobes. 5. Stable background generalized parenchymal atrophy and chronic small vessel ischemic disease. 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 Does the need for close, 24 hr/day medical supervision in concert with the patient's rehab needs make it unreasonable for this patient to be served in a less intensive setting? Yes and Potentially 2. Co-Morbidities requiring supervision/potential complications: aphasia, visual field deficits, renal transplant, Afib, PVD 3. Due to bladder management, bowel management, safety, skin/wound care, disease management, medication administration, pain management and patient education, does the patient require 24 hr/day rehab nursing? Yes and Potentially 4. Does the patient require coordinated care of a physician, rehab nurse, therapy disciplines of PT, OT and SLP to address physical  and functional deficits in the context of the above medical diagnosis(es)? Yes and Potentially Addressing deficits in the following areas: balance and endurance locomotion, transfers, bathing, dressing, cognition, speech, swallowing 5. Can the patient actively participate in an intensive therapy program of at least 3 hrs of therapy per day at least 5 days per week? Yes 6. The potential for patient to make measurable gains while on inpatient rehab is good 7. Anticipated functional outcomes upon discharge from inpatient rehab are supervision and min assist  with PT, supervision and min assist with OT, supervision and min assist with  SLP. 8. Estimated rehab length of stay to reach the above functional goals is: 2-2.5 weeks 9. Anticipated discharge destination: Other 10. Overall Rehab/Functional Prognosis: good   RECOMMENDATIONS: This patient's condition is appropriate for continued rehabilitative care in the following setting: CIR and SNF Patient has agreed to participate in recommended program. Potentially Note that insurance prior authorization may be required for reimbursement for recommended care.   Comment:  1. Pt doesn't have a clear dispo- we will look into this and see if possible to come to inpt rehab 2. Suggest Amantadine 100 mg daily for now for aphasia possible recovery 3. Is constipated, per pt- suggest bowel aids 4. If pt unable ot come to CIR, suggest f/u with PM&R [phone] myself or Dr Letta Pate who is out stroke specialist.  5. Thank you for this consult- will determine if can get to CIR.     Bary Leriche, PA-C 01/07/2020    I have personally performed a face to face diagnostic evaluation of this patient and formulated the key components of the plan.  Additionally, I have personally reviewed laboratory data, imaging studies, as well as relevant notes and concur with the physician assistant's documentation above.            Revision History                       Routing History

## 2020-01-10 NOTE — H&P (Signed)
Physical Medicine and Rehabilitation Admission H&P    Chief Complaint  Patient presents with  . Functional decline    HPI: Ryan Rice is a 69 year old male with history of PVD, CAF, BPH, HTN, renal transplant who was admitted on 12/30/2019 with changes in mentation and speech.  He was noted to have staring episodes, decrease in verbal output and stuttering.  History taken from chart review due to aphasia.  He was on chronic Coumadin which had been held in anticipation of GU procedure.  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  EEG negative for seizures.  He was started on steroids. Dr. Christella Noa was consulted with plans for biopsy on 04/08.  Follow-up MRI brain on 04/08 showed stable anterior left frontal lobe with hyperintense FLAIR suggesting blood products and new confluent area of diffusion abnormality left hemisphere gyriform small areas of chronic encephalomalacia question subacute infarct with petechial hemorrhages versus venous infarcts.  Surgery was canceled and neurology was consulted for input.  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 fib and lack of Coumadin.  He did not feel that biopsy was indicated.  Follow-up CT head showed stable evolving left ACA and left temporoparietal and occipital lobe infarcts.  MRV brain was negative for venous sinus thrombosis with hypoplastic left transverse sinus.  Echocardiogram with ejection fraction of 55-60 -60%.  Bilateral atrial dilatation.  Carotid Doppler showed no significant ICA stenosis.  Dr. Leonie Man recommended starting Eliquis on 4/10. He did have worsening of cognitive status on 04/12 with worsening of expressive and receptive aphasia with difficulty following commands and perseverative speech. Follow up CT head showed no change in lesion left frontal lobe and evolving  infarcts left temporoparietal and occipital lobes.     Nephrology was consulted for acute on chronic renal failure with hyperkalemia and he was started on IV fluids for hydration.  Renal ultrasound done revealing stable appearance of transplanted kidney without hydronephrosis. Nephrology has signed off as Hyperkalemia has corrected with IVF.  Steroids being tapered to home dose. Patient continues to be limited by expressive/receptive aphasia,  balance deficits and difficulty with sequencing. CIR recommended due to functional decline.  Please see preadmission assessment from earlier today as well. .   Review of Systems  Unable to perform ROS: Mental acuity    Past Medical History:  Diagnosis Date  . Allergic rhinitis   . BPH (benign prostatic hyperplasia)   . Chronic atrial fibrillation ([place])   . Diabetes mellitus ([place])   . DVT (deep venous thrombosis) ([facility])    LLE  . Hematochezia   . HTN (hypertension)   . Hypomagnesemia 03/20/2019  . Kidney transplant recipient   . PVD (peripheral vascular disease) ([place])     Past Surgical History:  Procedure Laterality Date  . KIDNEY TRANSPLANT      Family History  Problem Relation Age of Onset  . Stroke Mother   . Heart disease Father   . Diabetes Father   . Diabetes Sister   . Diabetes Brother   . Heart disease Brother     Social History:  Married. Per reports that he quit smoking about 35 years ago. He quit smokeless tobacco use about 6 years ago. Per reports has  current alcohol use and that he does not use drugs.    Allergies  Allergen Reactions  .  Iodinated Diagnostic Agents Hives  . Tape Hives    Medications Prior to Admission  Medication Sig Dispense Refill  . artificial tears (LACRILUBE) OINT ophthalmic ointment Place into the left eye at bedtime as needed for dry eyes. 1 Tube 1  . atorvastatin (LIPITOR) 10 MG tablet TAKE 1 TABLET (10 MG TOTAL) BY MOUTH DAILY AT 6 PM. 30 tablet 0  . brimonidine (ALPHAGAN) 0.2 % ophthalmic  solution Place 1 drop into the right eye 2 (two) times daily.    . cinacalcet (SENSIPAR) 60 MG tablet Take 60 mg by mouth daily.    . diclofenac Sodium (VOLTAREN) 1 % GEL Apply 4 g topically 2 (two) times daily. Apply to hips and legs    . dorzolamide-timolol (COSOPT) 22.3-6.8 MG/ML ophthalmic solution Place 1 drop into the right eye 2 (two) times a day.    . famotidine (PEPCID) 20 MG tablet Take 1 tablet (20 mg total) by mouth daily. 30 tablet 1  . finasteride (PROSCAR) 5 MG tablet Take 5 mg by mouth daily.    . folic acid (FOLVITE) 1 MG tablet TAKE 1 TABLET BY MOUTH EVERY DAY (Patient taking differently: Take 1 mg by mouth daily. ) 30 tablet 0  . JANUVIA 100 MG tablet Take 100 mg by mouth daily.    Marland Kitchen labetalol (NORMODYNE) 100 MG tablet Take 1 tablet (100 mg total) by mouth 2 (two) times daily.    . magnesium oxide (MAG-OX) 400 MG tablet Take 400 mg by mouth daily.    . metFORMIN (GLUCOPHAGE-XR) 500 MG 24 hr tablet Take 500 mg by mouth 2 (two) times daily.    . mycophenolate (MYFORTIC) 180 MG EC tablet Take 360 mg by mouth 2 (two) times daily.     . nateglinide (STARLIX) 120 MG tablet Take 120 mg by mouth 2 (two) times daily before a meal. On hold if BG is < 130    . NIFEdipine (ADALAT CC) 60 MG 24 hr tablet Take 60 mg by mouth daily.    . pioglitazone (ACTOS) 30 MG tablet Take 15 mg by mouth daily.    Marland Kitchen sulfamethoxazole-trimethoprim (BACTRIM) 400-80 MG tablet Take 1 tablet by mouth every Monday, Wednesday, and Friday.     . tacrolimus (PROGRAF) 1 MG capsule Take 2 mg by mouth 2 (two) times daily.     . tamsulosin (FLOMAX) 0.4 MG CAPS capsule Take 0.4 mg by mouth daily after breakfast.     . warfarin (COUMADIN) 5 MG tablet Take 2.5-5 mg by mouth See admin instructions. Take 2.5mg  on MON Wed THUR FRI SAT, then take 5mg  on Tues and SUN.    . [DISCONTINUED] predniSONE (DELTASONE) 10 MG tablet Take 10 mg by mouth daily with breakfast.      Drug Regimen Review  Drug regimen was reviewed and  remains appropriate with no significant issues identified  Home: Home Living Family/patient expects to be discharged to:: Private residence Living Arrangements: Spouse/significant other Available Help at Discharge: Family, Available 24 hours/day Type of Home: House Home Access: Stairs to enter [employer] of Steps: 1 Entrance Stairs-Rails: None Home Layout: One level Bathroom Shower/Tub: Chiropodist: Standard Bathroom Accessibility: Yes Home Equipment: Environmental consultant - 2 wheels Additional Comments: no DME  Lives With: Spouse, Family(grandaughter lives with them)   Functional History: Prior Function Level of Independence: Independent Comments: enjoys fishing  Functional Status:  Mobility: Bed Mobility Overal bed mobility: Needs Assistance Bed Mobility: Supine to Sit Supine to sit: Min assist General bed mobility  comments: multimodal cues and assist to bring bilat LE to EOB and to elevate trunk into sitting  Transfers Overall transfer level: Needs assistance Equipment used: 1 person hand held assist Transfers: Sit to/from Stand Sit to Stand: Min assist General transfer comment: assist to power up and to steady; pt stood several times due to bowel incontinence X 2 during session and standing for hygiene Ambulation/Gait Ambulation/Gait assistance: Min assist, Mod assist Gait Distance (Feet): 100 Feet Assistive device: 1 person hand held assist, Rolling walker (2 wheeled) Gait Pattern/deviations: Trunk flexed, Step-through pattern, Decreased stride length, Drifts right/left, Antalgic, Decreased stance time - left, Decreased step length - right, Wide base of support General Gait Details: initially HHA however pt requiring increased assistance for balance and with very flexed posture; pt able to correct posture briefly with cues; assistance for balance and for managing RW as pt tends to keep RW too far away  Gait velocity: varying  Gait velocity  interpretation: <1.8 ft/sec, indicate of risk for recurrent falls Stairs: Yes Stairs assistance: Min assist Stair Management: Two rails, Alternating pattern, Forwards Number of Stairs: (3 steps then 2 shorter steps) General stair comments: cues for safety and step to pattern; pt alternating pattern despite cues    ADL: ADL Overall ADL's : Needs assistance/impaired Eating/Feeding: Set up, Bed level Grooming: Cueing for sequencing, Sitting, Standing Grooming Details (indicate cue type and reason): Completed in standing, no LOB, but ataxic movement noted throughout Upper Body Bathing: Minimal assistance Lower Body Bathing: Moderate assistance, Sit to/from stand Lower Body Bathing Details (indicate cue type and reason): Completed in standing at sink, min A for safety and balance Upper Body Dressing : Minimal assistance Lower Body Dressing: Moderate assistance, Sit to/from stand Toilet Transfer: Min guard, Cueing for safety, Cueing for sequencing Toilet Transfer Details (indicate cue type and reason): Simulated with transfer to recliner after functional ambulation Toileting- Clothing Manipulation and Hygiene: Minimal assistance Toileting - Clothing Manipulation Details (indicate cue type and reason): apparently had to urinate and pulled the trashcan next to him to urinate in rather than notifying therapist or asking to go to the bathroom. Pt given urinal and used appropriately Functional mobility during ADLs: Minimal assistance(HHA) General ADL Comments: Pt attempting to wash face  Cognition: Cognition Overall Cognitive Status: Impaired/Different from baseline Arousal/Alertness: Awake/alert Orientation Level: Disoriented to situation, Disoriented to time, Disoriented to place, Oriented to person Attention: Sustained Sustained Attention: Impaired Sustained Attention Impairment: Verbal complex Memory: Impaired Memory Impairment: Decreased short term memory Decreased Short Term Memory:  Verbal basic Immediate Memory Recall: Sock, Blue, Bed Memory Recall Sock: With Cue Memory Recall Blue: With Cue Memory Recall Bed: Without Cue Awareness: Impaired Awareness Impairment: Intellectual impairment Problem Solving: Impaired Problem Solving Impairment: Verbal complex, Verbal basic Executive Function: Reasoning Reasoning: Impaired Reasoning Impairment: Verbal complex Behaviors: Perseveration Safety/Judgment: Impaired Cognition Arousal/Alertness: Awake/alert Behavior During Therapy: Impulsive Overall Cognitive Status: Impaired/Different from baseline Area of Impairment: Attention, Memory, Following commands, Safety/judgement, Problem solving, Orientation, Awareness Orientation Level: Situation, Time Current Attention Level: Sustained Memory: Decreased short-term memory Following Commands: Follows one step commands inconsistently, Follows one step commands with increased time Safety/Judgement: Decreased awareness of safety, Decreased awareness of deficits Awareness: Emergent Problem Solving: Requires tactile cues, Difficulty sequencing, Requires verbal cues, Slow processing General Comments: pt unable to state name and with increased word salad this session; perseverating Difficult to assess due to: (aphasia)   Blood pressure 107/64, pulse 76, temperature 98.4 F (36.9 C), temperature source Oral, resp. rate 20, height 5\' 11"  (1.803  m), weight 86.6 kg, SpO2 98 %. Physical Exam  Nursing note and vitals reviewed. Constitutional: He appears well-developed and well-nourished.  HENT:  Head: Normocephalic and atraumatic.  Eyes: EOM are normal. Right eye exhibits no discharge. Left eye exhibits no discharge.  Neck: No tracheal deviation present. No thyromegaly present.  Respiratory: Effort normal. No stridor. No respiratory distress.  GI: Soft. He exhibits no distension.  Musculoskeletal:     Comments: No edema or tenderness in extremities  Neurological: He is alert.    Verbal output limited and reported yes to most questions.  Expressive >receptive aphasia Motor: Right upper extremity: 5/5 proximal distal Right lower extremity: 4+/5 proximal distally  Left upper extremity: 4/5 proximal distally left lower extremity: Hip flexion knee extension 4/5, ankle dorsiflexion 2-/5  Skin: Skin is warm and dry.  Psychiatric:  Unable to assess due to mentation    Results for orders placed or performed during the hospital encounter of 12/29/19 (from the past 48 hour(s))  Glucose, capillary     Status: Abnormal   Collection Time: 01/08/20  4:19 PM  Result Value Ref Range   Glucose-Capillary 241 (H) 70 - 99 mg/dL    Comment: Glucose reference range applies only to samples taken after fasting for at least 8 hours.   Comment 1 Notify RN    Comment 2 Document in Chart   Glucose, capillary     Status: Abnormal   Collection Time: 01/08/20  9:05 PM  Result Value Ref Range   Glucose-Capillary 244 (H) 70 - 99 mg/dL    Comment: Glucose reference range applies only to samples taken after fasting for at least 8 hours.   Comment 1 Notify RN    Comment 2 Document in Chart   Basic metabolic panel     Status: Abnormal   Collection Time: 01/09/20  3:48 AM  Result Value Ref Range   Sodium 131 (L) 135 - 145 mmol/L   Potassium 4.9 3.5 - 5.1 mmol/L   Chloride 101 98 - 111 mmol/L   CO2 22 22 - 32 mmol/L   Glucose, Bld 161 (H) 70 - 99 mg/dL    Comment: Glucose reference range applies only to samples taken after fasting for at least 8 hours.   BUN 53 (H) 8 - 23 mg/dL   Creatinine, Ser 1.33 (H) 0.61 - 1.24 mg/dL   Calcium 8.7 (L) 8.9 - 10.3 mg/dL   GFR calc non Af Amer 54 (L) >60 mL/min   GFR calc Af Amer >60 >60 mL/min   Anion gap 8 5 - 15    Comment: Performed at [facility] [address], [place], [place] [address]  Glucose, capillary     Status: Abnormal   Collection Time: 01/09/20  6:36 AM  Result Value Ref Range   Glucose-Capillary 164 (H) 70 - 99 mg/dL     Comment: Glucose reference range applies only to samples taken after fasting for at least 8 hours.  Glucose, capillary     Status: Abnormal   Collection Time: 01/09/20 11:35 AM  Result Value Ref Range   Glucose-Capillary 110 (H) 70 - 99 mg/dL    Comment: Glucose reference range applies only to samples taken after fasting for at least 8 hours.   Comment 1 Notify RN    Comment 2 Document in Chart   Glucose, capillary     Status: Abnormal   Collection Time: 01/09/20  3:14 PM  Result Value Ref Range   Glucose-Capillary 138 (H) 70 -  99 mg/dL    Comment: Glucose reference range applies only to samples taken after fasting for at least 8 hours.   Comment 1 Notify RN    Comment 2 Document in Chart   Glucose, capillary     Status: Abnormal   Collection Time: 01/09/20  9:21 PM  Result Value Ref Range   Glucose-Capillary 198 (H) 70 - 99 mg/dL    Comment: Glucose reference range applies only to samples taken after fasting for at least 8 hours.  Basic metabolic panel     Status: Abnormal   Collection Time: 01/10/20  4:32 AM  Result Value Ref Range   Sodium 131 (L) 135 - 145 mmol/L   Potassium 5.0 3.5 - 5.1 mmol/L   Chloride 102 98 - 111 mmol/L   CO2 20 (L) 22 - 32 mmol/L   Glucose, Bld 165 (H) 70 - 99 mg/dL    Comment: Glucose reference range applies only to samples taken after fasting for at least 8 hours.   BUN 56 (H) 8 - 23 mg/dL   Creatinine, Ser 1.69 (H) 0.61 - 1.24 mg/dL   Calcium 8.5 (L) 8.9 - 10.3 mg/dL   GFR calc non Af Amer 41 (L) >60 mL/min   GFR calc Af Amer 47 (L) >60 mL/min   Anion gap 9 5 - 15    Comment: Performed at [facility] [address], [place], [place] [address]  Glucose, capillary     Status: Abnormal   Collection Time: 01/10/20  6:28 AM  Result Value Ref Range   Glucose-Capillary 159 (H) 70 - 99 mg/dL    Comment: Glucose reference range applies only to samples taken after fasting for at least 8 hours.  Glucose, capillary     Status: Abnormal   Collection  Time: 01/10/20 12:03 PM  Result Value Ref Range   Glucose-Capillary 111 (H) 70 - 99 mg/dL    Comment: Glucose reference range applies only to samples taken after fasting for at least 8 hours.   Comment 1 Notify RN    Comment 2 Document in Chart    No results found.     Medical Problem List and Plan: 1.  Deficits with mobility, transfers, cognition secondary to left MCA/ACA embolic infarcts.  -patient may shower  -ELOS/Goals: 12-17 days/supervision/min a  Admit to CIR 2.  Antithrombotics: -DVT/anticoagulation:  Pharmaceutical: Other (comment)--Eliquis  -antiplatelet therapy: N/A 3. Pain Management: N/A 4. Mood: LCSW to follow for evaluation and support when appropriate.   -antipsychotic agents: N/A 5. Neuropsych: This patient is not capable of making decisions on his own behalf. 6. Skin/Wound Care: Routine pressure relief measures.  7. Fluids/Electrolytes/Nutrition: Monitor I/O. Intake variable.  8.  HTN: Monitor tid--Labetalol was d/c due to pauses--now on metoprolol, hydralazine and Nifedipine   Monitor with increased mobility 9.  CKD s/p renal transplant: Continues to have upward trend BUN and creatinine despite IV fluids for hydration. On prednisone taper to 10 mg daily. Sepra DS MWF,Prograf and Myfortic.   CMP ordered. 10.  T2DM: Hgb A1c- 7.1.  Was on metformin, Starlix and Januvia PTA. Monitor blood sugars ac/hs. Continue Lantus 25 units daily and use SSI for elevated BS.   Monitor with increased mobility  Bary Leriche, PA-C 01/10/2020  I have personally performed a face to face diagnostic evaluation, including, but not limited to relevant history and physical exam findings, of this patient and developed relevant assessment and plan.  Additionally, I have reviewed and concur with the physician  assistant's documentation above.  Delice Lesch, MD, ABPMR

## 2020-01-10 NOTE — IPOC Note (Signed)
Individualized overall Plan of Care [facility]) Patient Details Name: Ryan Rice MRN: [id number] DOB: [birth year]  Admitting Diagnosis: Embolic infarction [facility])  Hospital Problems: Principal Problem:   Embolic infarction [facility]) Active Problems:   Ischemic stroke of frontal lobe ([place])   Controlled type 2 diabetes mellitus with hyperglycemia, without long-term current use of insulin ([place])   Renal transplant, status post   Essential hypertension     Functional Problem List: Nursing Bladder, Bowel, Endurance, Medication Management, Pain, Perception, Safety  PT Balance, Endurance, Motor, Safety, Perception  OT Balance, Cognition, Motor, Perception, Safety, Skin Integrity  SLP Cognition, Linguistic  TR         Basic ADL's: OT Grooming, Bathing, Dressing, Toileting, Eating     Advanced  ADL's: OT       Transfers: PT Bed Mobility, Bed to Chair, Car, Manufacturing systems engineer, Metallurgist: PT Ambulation, Emergency planning/management officer, Stairs     Additional Impairments: OT None  SLP Communication, Social Cognition comprehension, expression Problem Solving, Attention, Awareness  TR      Anticipated Outcomes Item Anticipated Outcome  Self Feeding supervision  Swallowing      Basic self-care  supervision  Toileting  supervision   Bathroom Transfers supervision  Bowel/Bladder  Supervision assist and maintain continence  Transfers  supervision   Locomotion  supervision household gait distances and stairs  Communication  Mod A  Cognition  Min A  Pain  <3 on a 0-10 pain scale  Safety/Judgment  min assist with safety transfers, with cues and reminders to call for assistance   Therapy Plan: PT Intensity: Minimum of 1-2 x/day ,45 to 90 minutes PT Frequency: 5 out of 7 days PT Duration Estimated Length of Stay: 12-14 days OT Intensity: Minimum of 1-2 x/day, 45 to 90 minutes OT Frequency: 5 out of 7 days OT Duration/Estimated Length of Stay: ~2 weeks SLP Intensity:  Minumum of 1-2 x/day, 30 to 90 minutes SLP Frequency: 3 to 5 out of 7 days SLP Duration/Estimated Length of Stay: 2 to 2.5 weeks    Team Interventions: Nursing Interventions Patient/Family Education, Bladder Management, Bowel Management, Disease Management/Prevention, Discharge Planning, Pain Management, Medication Management, Psychosocial Support  PT interventions Ambulation/gait training, Medical illustrator training, Cognitive remediation/compensation, Community reintegration, Discharge planning, Disease management/prevention, DME/adaptive equipment instruction, Functional mobility training, Neuromuscular re-education, Pain management, Patient/family education, Psychosocial support, Skin care/wound management, Splinting/orthotics, Stair training, Therapeutic Activities, UE/LE Strength taining/ROM, Therapeutic Exercise, UE/LE Coordination activities, Visual/perceptual remediation/compensation, Wheelchair propulsion/positioning, Functional electrical stimulation  OT Interventions Balance/vestibular training, Neuromuscular re-education, Self Care/advanced ADL retraining, Therapeutic Exercise, Cognitive remediation/compensation, DME/adaptive equipment instruction, Pain management, Skin care/wound managment, UE/LE Strength taining/ROM, Community reintegration, Barrister's clerk education, UE/LE Coordination activities, Discharge planning, Functional mobility training, Psychosocial support, Therapeutic Activities  SLP Interventions Speech/Language facilitation, Cognitive remediation/compensation  TR Interventions    SW/CM Interventions     Barriers to Discharge MD  Medical stability  Nursing Decreased caregiver support, Medical stability, Home environment access/layout, Incontinence, Medication compliance    PT Incontinence, Decreased caregiver support(unclear what support is available at home)    OT      SLP      SW       Team Discharge Planning: Destination: PT-Home ,OT- Home ,  SLP-Home Projected Follow-up: PT-Home health PT, 24 hour supervision/assistance, OT-  Home health OT, Outpatient OT, SLP-Other (comment)(TBD) Projected Equipment Needs: PT-To be determined, OT- To be determined, SLP-None recommended by SLP Equipment Details: PT- , OT-  Patient/family involved in discharge planning: PT- Patient,  OT-Patient,  SLP-Patient unable/family or caregive not available  MD ELOS: 10-14 days. Medical Rehab Prognosis:  Good Assessment: 69 year old male with history of PVD, CAF, BPH, HTN, renal transplant who was admitted on 12/30/2019 with changes in mentation and speech.  He was noted to have staring episodes, decrease in verbal output and stuttering.  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  EEG negative for seizures.  He was started on steroids. Dr. Christella Noa was consulted with plans for biopsy on 04/08.  Follow-up MRI brain on 04/08 showed stable anterior left frontal lobe with hyperintense FLAIR suggesting blood products and new confluent area of diffusion abnormality left hemisphere gyriform small areas of chronic encephalomalacia question subacute infarct with petechial hemorrhages versus venous infarcts.  Surgery was canceled and neurology was consulted for input.  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 fib and lack of Coumadin.  He did not feel that biopsy was indicated.  Follow-up CT head showed stable evolving left ACA and left temporoparietal and occipital lobe infarcts.  MRV brain was negative for venous sinus thrombosis with hypoplastic left transverse sinus.  Echocardiogram with ejection fraction of 55-60 -60%.  Bilateral atrial dilatation.  Carotid Doppler showed no significant ICA stenosis.  Dr. Leonie Man recommended starting Eliquis on 4/10. He did have worsening of cognitive status on 04/12 with  worsening of expressive and receptive aphasia with difficulty following commands and perseverative speech. Follow up CT head showed no change in lesion left frontal lobe and evolving infarcts left temporoparietal and occipital lobes.     Nephrology was consulted for acute on chronic renal failure with hyperkalemia and he was started on IV fluids for hydration.  Renal ultrasound done revealing stable appearance of transplanted kidney without hydronephrosis. Nephrology has signed off as Hyperkalemia has corrected with IVF.  Steroids being tapered to home dose. Patient continues to be limited by expressive/receptive aphasia  balance deficits and difficulty with sequencing.  We will set goals for supervision with PT/OT and min/mod a with SLP.   Due to the current state of emergency, patients may not be receiving their 3-hours of Medicare-mandated therapy.  See Team Conference Notes for weekly updates to the plan of care

## 2020-01-10 NOTE — Plan of Care (Signed)

## 2020-01-10 NOTE — H&P (Signed)
Physical Medicine and Rehabilitation Admission H&P    Chief Complaint  Patient presents with  . Functional decline    HPI: Ryan Rice is a 69 year old male with history of PVD, CAF, BPH, HTN, renal transplant who was admitted on 12/30/2019 with changes in mentation and speech.  He was noted to have staring episodes, decrease in verbal output and stuttering.  History taken from chart review due to aphasia.  He was on chronic Coumadin which had been held in anticipation of GU procedure.  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  EEG negative for seizures.  He was started on steroids. Dr. Christella Noa was consulted with plans for biopsy on 04/08.  Follow-up MRI brain on 04/08 showed stable anterior left frontal lobe with hyperintense FLAIR suggesting blood products and new confluent area of diffusion abnormality left hemisphere gyriform small areas of chronic encephalomalacia question subacute infarct with petechial hemorrhages versus venous infarcts.  Surgery was canceled and neurology was consulted for input.  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 fib and lack of Coumadin.  He did not feel that biopsy was indicated.  Follow-up CT head showed stable evolving left ACA and left temporoparietal and occipital lobe infarcts.  MRV brain was negative for venous sinus thrombosis with hypoplastic left transverse sinus.  Echocardiogram with ejection fraction of 55-60 -60%.  Bilateral atrial dilatation.  Carotid Doppler showed no significant ICA stenosis.  Dr. Leonie Man recommended starting Eliquis on 4/10. He did have worsening of cognitive status on 04/12 with worsening of expressive and receptive aphasia with difficulty following commands and perseverative speech. Follow up CT head showed no change in lesion left frontal lobe and evolving  infarcts left temporoparietal and occipital lobes.     Nephrology was consulted for acute on chronic renal failure with hyperkalemia and he was started on IV fluids for hydration.  Renal ultrasound done revealing stable appearance of transplanted kidney without hydronephrosis. Nephrology has signed off as Hyperkalemia has corrected with IVF.  Steroids being tapered to home dose. Patient continues to be limited by expressive/receptive aphasia,  balance deficits and difficulty with sequencing. CIR recommended due to functional decline.  Please see preadmission assessment from earlier today as well.  Review of Systems  Unable to perform ROS: Mental acuity    Past Medical History:  Diagnosis Date  . Allergic rhinitis   . BPH (benign prostatic hyperplasia)   . Chronic atrial fibrillation ([place])   . Diabetes mellitus ([place])   . DVT (deep venous thrombosis) ([facility])    LLE  . Hematochezia   . HTN (hypertension)   . Hypomagnesemia 03/20/2019  . Kidney transplant recipient   . PVD (peripheral vascular disease) ([place])     Past Surgical History:  Procedure Laterality Date  . KIDNEY TRANSPLANT      Family History  Problem Relation Age of Onset  . Stroke Mother   . Heart disease Father   . Diabetes Father   . Diabetes Sister   . Diabetes Brother   . Heart disease Brother     Social History:  Married. Per reports that he quit smoking about 35 years ago. He quit smokeless tobacco use about 6 years ago. Per reports has  current alcohol use and that he does not use drugs.    Allergies  Allergen Reactions  . Iodinated  Diagnostic Agents Hives  . Tape Hives    Medications Prior to Admission  Medication Sig Dispense Refill  . artificial tears (LACRILUBE) OINT ophthalmic ointment Place into the left eye at bedtime as needed for dry eyes. 1 Tube 1  . atorvastatin (LIPITOR) 10 MG tablet TAKE 1 TABLET (10 MG TOTAL) BY MOUTH DAILY AT 6 PM. 30 tablet 0  . brimonidine (ALPHAGAN) 0.2 % ophthalmic  solution Place 1 drop into the right eye 2 (two) times daily.    . cinacalcet (SENSIPAR) 60 MG tablet Take 60 mg by mouth daily.    . diclofenac Sodium (VOLTAREN) 1 % GEL Apply 4 g topically 2 (two) times daily. Apply to hips and legs    . dorzolamide-timolol (COSOPT) 22.3-6.8 MG/ML ophthalmic solution Place 1 drop into the right eye 2 (two) times a day.    . famotidine (PEPCID) 20 MG tablet Take 1 tablet (20 mg total) by mouth daily. 30 tablet 1  . finasteride (PROSCAR) 5 MG tablet Take 5 mg by mouth daily.    . folic acid (FOLVITE) 1 MG tablet TAKE 1 TABLET BY MOUTH EVERY DAY (Patient taking differently: Take 1 mg by mouth daily. ) 30 tablet 0  . JANUVIA 100 MG tablet Take 100 mg by mouth daily.    Marland Kitchen labetalol (NORMODYNE) 100 MG tablet Take 1 tablet (100 mg total) by mouth 2 (two) times daily.    . magnesium oxide (MAG-OX) 400 MG tablet Take 400 mg by mouth daily.    . metFORMIN (GLUCOPHAGE-XR) 500 MG 24 hr tablet Take 500 mg by mouth 2 (two) times daily.    . mycophenolate (MYFORTIC) 180 MG EC tablet Take 360 mg by mouth 2 (two) times daily.     . nateglinide (STARLIX) 120 MG tablet Take 120 mg by mouth 2 (two) times daily before a meal. On hold if BG is < 130    . NIFEdipine (ADALAT CC) 60 MG 24 hr tablet Take 60 mg by mouth daily.    . pioglitazone (ACTOS) 30 MG tablet Take 15 mg by mouth daily.    Marland Kitchen sulfamethoxazole-trimethoprim (BACTRIM) 400-80 MG tablet Take 1 tablet by mouth every Monday, Wednesday, and Friday.     . tacrolimus (PROGRAF) 1 MG capsule Take 2 mg by mouth 2 (two) times daily.     . tamsulosin (FLOMAX) 0.4 MG CAPS capsule Take 0.4 mg by mouth daily after breakfast.     . warfarin (COUMADIN) 5 MG tablet Take 2.5-5 mg by mouth See admin instructions. Take 2.5mg  on MON Wed THUR FRI SAT, then take 5mg  on Tues and SUN.    . [DISCONTINUED] predniSONE (DELTASONE) 10 MG tablet Take 10 mg by mouth daily with breakfast.      Drug Regimen Review  Drug regimen was reviewed and  remains appropriate with no significant issues identified  Home: Home Living Family/patient expects to be discharged to:: Private residence Living Arrangements: Spouse/significant other Available Help at Discharge: Family, Available 24 hours/day Type of Home: House Home Access: Stairs to enter [employer] of Steps: 1 Entrance Stairs-Rails: None Home Layout: One level Bathroom Shower/Tub: Chiropodist: Standard Bathroom Accessibility: Yes Home Equipment: Environmental consultant - 2 wheels Additional Comments: no DME  Lives With: Spouse, Family(grandaughter lives with them)   Functional History: Prior Function Level of Independence: Independent Comments: enjoys fishing  Functional Status:  Mobility: Bed Mobility Overal bed mobility: Needs Assistance Bed Mobility: Supine to Sit Supine to sit: Min assist General bed mobility comments:  multimodal cues and assist to bring bilat LE to EOB and to elevate trunk into sitting  Transfers Overall transfer level: Needs assistance Equipment used: 1 person hand held assist Transfers: Sit to/from Stand Sit to Stand: Min assist General transfer comment: assist to power up and to steady; pt stood several times due to bowel incontinence X 2 during session and standing for hygiene Ambulation/Gait Ambulation/Gait assistance: Min assist, Mod assist Gait Distance (Feet): 100 Feet Assistive device: 1 person hand held assist, Rolling walker (2 wheeled) Gait Pattern/deviations: Trunk flexed, Step-through pattern, Decreased stride length, Drifts right/left, Antalgic, Decreased stance time - left, Decreased step length - right, Wide base of support General Gait Details: initially HHA however pt requiring increased assistance for balance and with very flexed posture; pt able to correct posture briefly with cues; assistance for balance and for managing RW as pt tends to keep RW too far away  Gait velocity: varying  Gait velocity  interpretation: <1.8 ft/sec, indicate of risk for recurrent falls Stairs: Yes Stairs assistance: Min assist Stair Management: Two rails, Alternating pattern, Forwards Number of Stairs: (3 steps then 2 shorter steps) General stair comments: cues for safety and step to pattern; pt alternating pattern despite cues    ADL: ADL Overall ADL's : Needs assistance/impaired Eating/Feeding: Set up, Bed level Grooming: Cueing for sequencing, Sitting, Standing Grooming Details (indicate cue type and reason): Completed in standing, no LOB, but ataxic movement noted throughout Upper Body Bathing: Minimal assistance Lower Body Bathing: Moderate assistance, Sit to/from stand Lower Body Bathing Details (indicate cue type and reason): Completed in standing at sink, min A for safety and balance Upper Body Dressing : Minimal assistance Lower Body Dressing: Moderate assistance, Sit to/from stand Toilet Transfer: Min guard, Cueing for safety, Cueing for sequencing Toilet Transfer Details (indicate cue type and reason): Simulated with transfer to recliner after functional ambulation Toileting- Clothing Manipulation and Hygiene: Minimal assistance Toileting - Clothing Manipulation Details (indicate cue type and reason): apparently had to urinate and pulled the trashcan next to him to urinate in rather than notifying therapist or asking to go to the bathroom. Pt given urinal and used appropriately Functional mobility during ADLs: Minimal assistance(HHA) General ADL Comments: Pt attempting to wash face  Cognition: Cognition Overall Cognitive Status: Impaired/Different from baseline Arousal/Alertness: Awake/alert Orientation Level: Disoriented to situation, Disoriented to time, Disoriented to place, Oriented to person Attention: Sustained Sustained Attention: Impaired Sustained Attention Impairment: Verbal complex Memory: Impaired Memory Impairment: Decreased short term memory Decreased Short Term Memory:  Verbal basic Immediate Memory Recall: Sock, Blue, Bed Memory Recall Sock: With Cue Memory Recall Blue: With Cue Memory Recall Bed: Without Cue Awareness: Impaired Awareness Impairment: Intellectual impairment Problem Solving: Impaired Problem Solving Impairment: Verbal complex, Verbal basic Executive Function: Reasoning Reasoning: Impaired Reasoning Impairment: Verbal complex Behaviors: Perseveration Safety/Judgment: Impaired Cognition Arousal/Alertness: Awake/alert Behavior During Therapy: Impulsive Overall Cognitive Status: Impaired/Different from baseline Area of Impairment: Attention, Memory, Following commands, Safety/judgement, Problem solving, Orientation, Awareness Orientation Level: Situation, Time Current Attention Level: Sustained Memory: Decreased short-term memory Following Commands: Follows one step commands inconsistently, Follows one step commands with increased time Safety/Judgement: Decreased awareness of safety, Decreased awareness of deficits Awareness: Emergent Problem Solving: Requires tactile cues, Difficulty sequencing, Requires verbal cues, Slow processing General Comments: pt unable to state name and with increased word salad this session; perseverating Difficult to assess due to: (aphasia)   Blood pressure 107/64, pulse 76, temperature 98.4 F (36.9 C), temperature source Oral, resp. rate 20, height 5\' 11"  (1.803 m),  weight 86.6 kg, SpO2 98 %. Physical Exam  Nursing note and vitals reviewed. Constitutional: He appears well-developed and well-nourished.  HENT:  Head: Normocephalic and atraumatic.  Eyes: EOM are normal. Right eye exhibits no discharge. Left eye exhibits no discharge.  Neck: No tracheal deviation present. No thyromegaly present.  Respiratory: Effort normal. No stridor. No respiratory distress.  GI: Soft. He exhibits no distension.  Musculoskeletal:     Comments: No edema or tenderness in extremities  Neurological: He is alert.    Verbal output limited and reported yes to most questions.  Expressive >receptive aphasia Motor: Right upper extremity: 5/5 proximal distal Right lower extremity: 4+/5 proximal distally  Left upper extremity: 4/5 proximal distally left lower extremity: Hip flexion knee extension 4/5, ankle dorsiflexion 2-/5  Skin: Skin is warm and dry.  Psychiatric:  Unable to assess due to mentation    Results for orders placed or performed during the hospital encounter of 12/29/19 (from the past 48 hour(s))  Glucose, capillary     Status: Abnormal   Collection Time: 01/08/20  4:19 PM  Result Value Ref Range   Glucose-Capillary 241 (H) 70 - 99 mg/dL    Comment: Glucose reference range applies only to samples taken after fasting for at least 8 hours.   Comment 1 Notify RN    Comment 2 Document in Chart   Glucose, capillary     Status: Abnormal   Collection Time: 01/08/20  9:05 PM  Result Value Ref Range   Glucose-Capillary 244 (H) 70 - 99 mg/dL    Comment: Glucose reference range applies only to samples taken after fasting for at least 8 hours.   Comment 1 Notify RN    Comment 2 Document in Chart   Basic metabolic panel     Status: Abnormal   Collection Time: 01/09/20  3:48 AM  Result Value Ref Range   Sodium 131 (L) 135 - 145 mmol/L   Potassium 4.9 3.5 - 5.1 mmol/L   Chloride 101 98 - 111 mmol/L   CO2 22 22 - 32 mmol/L   Glucose, Bld 161 (H) 70 - 99 mg/dL    Comment: Glucose reference range applies only to samples taken after fasting for at least 8 hours.   BUN 53 (H) 8 - 23 mg/dL   Creatinine, Ser 1.33 (H) 0.61 - 1.24 mg/dL   Calcium 8.7 (L) 8.9 - 10.3 mg/dL   GFR calc non Af Amer 54 (L) >60 mL/min   GFR calc Af Amer >60 >60 mL/min   Anion gap 8 5 - 15    Comment: Performed at [facility] [address], [place], [place] [address]  Glucose, capillary     Status: Abnormal   Collection Time: 01/09/20  6:36 AM  Result Value Ref Range   Glucose-Capillary 164 (H) 70 - 99 mg/dL     Comment: Glucose reference range applies only to samples taken after fasting for at least 8 hours.  Glucose, capillary     Status: Abnormal   Collection Time: 01/09/20 11:35 AM  Result Value Ref Range   Glucose-Capillary 110 (H) 70 - 99 mg/dL    Comment: Glucose reference range applies only to samples taken after fasting for at least 8 hours.   Comment 1 Notify RN    Comment 2 Document in Chart   Glucose, capillary     Status: Abnormal   Collection Time: 01/09/20  3:14 PM  Result Value Ref Range   Glucose-Capillary 138 (H) 70 -  99 mg/dL    Comment: Glucose reference range applies only to samples taken after fasting for at least 8 hours.   Comment 1 Notify RN    Comment 2 Document in Chart   Glucose, capillary     Status: Abnormal   Collection Time: 01/09/20  9:21 PM  Result Value Ref Range   Glucose-Capillary 198 (H) 70 - 99 mg/dL    Comment: Glucose reference range applies only to samples taken after fasting for at least 8 hours.  Basic metabolic panel     Status: Abnormal   Collection Time: 01/10/20  4:32 AM  Result Value Ref Range   Sodium 131 (L) 135 - 145 mmol/L   Potassium 5.0 3.5 - 5.1 mmol/L   Chloride 102 98 - 111 mmol/L   CO2 20 (L) 22 - 32 mmol/L   Glucose, Bld 165 (H) 70 - 99 mg/dL    Comment: Glucose reference range applies only to samples taken after fasting for at least 8 hours.   BUN 56 (H) 8 - 23 mg/dL   Creatinine, Ser 1.69 (H) 0.61 - 1.24 mg/dL   Calcium 8.5 (L) 8.9 - 10.3 mg/dL   GFR calc non Af Amer 41 (L) >60 mL/min   GFR calc Af Amer 47 (L) >60 mL/min   Anion gap 9 5 - 15    Comment: Performed at [facility] [address], [place], [place] [address]  Glucose, capillary     Status: Abnormal   Collection Time: 01/10/20  6:28 AM  Result Value Ref Range   Glucose-Capillary 159 (H) 70 - 99 mg/dL    Comment: Glucose reference range applies only to samples taken after fasting for at least 8 hours.  Glucose, capillary     Status: Abnormal   Collection  Time: 01/10/20 12:03 PM  Result Value Ref Range   Glucose-Capillary 111 (H) 70 - 99 mg/dL    Comment: Glucose reference range applies only to samples taken after fasting for at least 8 hours.   Comment 1 Notify RN    Comment 2 Document in Chart    No results found.     Medical Problem List and Plan: 1.  Deficits with mobility, transfers, cognition secondary to left MCA/ACA embolic infarcts.  -patient may shower  -ELOS/Goals: 12-17 days/supervision/min a  Admit to CIR 2.  Antithrombotics: -DVT/anticoagulation:  Pharmaceutical: Other (comment)--Eliquis  -antiplatelet therapy: N/A 3. Pain Management: N/A 4. Mood: LCSW to follow for evaluation and support when appropriate.   -antipsychotic agents: N/A 5. Neuropsych: This patient is not capable of making decisions on his own behalf. 6. Skin/Wound Care: Routine pressure relief measures.  7. Fluids/Electrolytes/Nutrition: Monitor I/O. Intake variable.  8.  HTN: Monitor tid--Labetalol was d/c due to pauses--now on metoprolol, hydralazine and Nifedipine   Monitor with increased mobility 9.  CKD s/p renal transplant: Continues to have upward trend BUN and creatinine despite IV fluids for hydration. On prednisone taper to 10 mg daily. Sepra DS MWF,Prograf and Myfortic.   CMP ordered. 10.  T2DM with hyperglycemia: Hgb A1c- 7.1.  Was on metformin, Starlix and Januvia PTA. Monitor blood sugars ac/hs. Continue Lantus 25 units daily and use SSI for elevated BS.   Monitor with increased mobility  Bary Leriche, PA-C 01/10/2020  I have personally performed a face to face diagnostic evaluation, including, but not limited to relevant history and physical exam findings, of this patient and developed relevant assessment and plan.  Additionally, I have reviewed and concur with  the physician assistant's documentation above.  Delice Lesch, MD, ABPMR  The patient's status has not changed. The original post admission physician evaluation remains  appropriate, and any changes from the pre-admission screening or documentation from the acute chart are noted above.   Delice Lesch, MD, ABPMR

## 2020-01-10 NOTE — Progress Notes (Signed)
Inpatient Rehabilitation-Admissions Coordinator   I have received insurance approval and medical clearance from Dr. Dwyane Dee for admit to CIR today. Pt and his wife notified of bed opening and have accepted bed offer. I have reviewed consent forms and insurance benefit letter with pt and his wife. RN and [facility] team aware of plan for admit to CIR today.   Please call if questions.   Raechel Ache, OTR/L  Rehab Admissions Coordinator  [phone] 01/10/2020 12:26 PM

## 2020-01-10 NOTE — Progress Notes (Signed)
Ryan Arn, MD  Physician  Physical Medicine and Rehabilitation  PMR Pre-admission      Addendum  Date of Service:  01/08/2020  3:36 PM      Related encounter: ED to Hosp-Admission (Discharged) from 12/29/2019 in [facility]        Show:Clear all _0 Manual_1 Template_2 Copied  Added by: _3 Raechel Ache, OT_4 Lind Covert, Lauren P, CCC-SLP_5 Ryan Arn, MD  _6 Hover for details PMR Admission Coordinator Pre-Admission Assessment   Patient: Ryan Rice is an 68 y.o., male MRN: [id number] DOB: [birth year] Height: _7  (180.3 cm) Weight: 86.6 kg                                                                                                                                                  Insurance Information HMO: yes    PPO:      PCP:      IPA:      80/20:      OTHER:  PRIMARY: [employer]      Policy#: [id number]      Subscriber: patient CM Name: Lattie Haw       Phone#: [phone]     Fax#: [phone] Pre-Cert#: [id number]       Employer:  Josem Kaufmann provided by Lattie Haw with [facility] Medicare for admit to CIR with start date 4/15. Auth is good for 5 days to admit. Call once admitted if start date different. Follow up CM is [employer]. Fax weekly updates to (F): [phone] (P): [phone] Benefits:  Phone #: online via [email or web address]     Name:  Eff. Date: 09/27/2019-09/25/2020     Deduct: NA for in-network providers      Out of Pocket Max: $3,900 ($385.67 met)      Life Max:   CIR: $295/day for days 1-6, $0/day for days 7-90      SNF: $0/day for days 1-20, $184/day for days 21-100; limited to 100 days/calendar year Outpatient: $10-$40/visit pending service; visits limited by medical necessity     Co-Pay:  Home Health: 100% coverage, 0% co-insurance; limited by medical necessity      Co-Pay:  DME: 80% coverage, 20% co-insurance     Co-Pay:  Providers:  SECONDARY: none        Financial Counselor:       Phone#:    The Therapist, art  Information Summary" for patients in Inpatient Rehabilitation Facilities with attached "Privacy Act [place] Records" was provided and verbally reviewed with: Family   Emergency Contact Information         Contact Information     Name Relation Home Work Mobile    [place] Spouse     [phone]       Current Medical History  Patient Admitting Diagnosis: L ACA territory infarct History of Present Illness: Pt is a 69 y.o.male with history of PVD,  renal transplant, CAF, BPH, HTN, who was admitted on 12/30/19 with staring episodes, decrease in verbal output and stuttering.  Patient on chronic coumadin and had been off coumadin in anticipation of GU procedure.  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 EEG without seizure activity and Dr.Cabbell consulted with plans for biopsy on 04/08.  for input on biopsy. Follow up MRI brain on 04/08 showed stable anterior left frontal lobe with hyperintense flare suggesting blood products and new confluent areas of diffusion abnormality left hemisphere gyriform with small areas of chronic encephalomalacia question subacute infarct with petechial hemorrhage or venous infarcts.  Surgery cancelled and  [name] consulted for input. He felt that patient with L-ACA infarcts with hemorrhagic conversion as well as L-MCA infarcts--embolic due to A fib and lack of coumadin. He did not feel that brain biopsy was indicated.    Follow up CT head showed stable evolving L-ACA and left temporoparietal and occipital infarcts. MRV brain was negative for venous sinus thrombosis with hypoplastic left transverse sinus.    2D echo showed EF 55-60% and bilateral atrial dilatation. Carotid dopplers without significant ICA stenosis.  Nephrology consulted for acute on chronic renal failure with hyperkalemia and he was started on IVF for hydration and renal ultrasound ordered for work up.  Therapy ongoing  revealing expressive and receptive language deficits with verbal perseveration and phonemic paraphasias, left visual field deficits  and antalgic gait with poor safety awareness. CIR recommended due to functional deficits.     Complete NIHSS TOTAL: 2 Glasgow Coma Scale Score: 15   Past Medical History  Past Medical History:  Diagnosis Date  . Allergic rhinitis    . BPH (benign prostatic hyperplasia)    . Chronic atrial fibrillation ([place])    . Diabetes mellitus ([place])    . DVT (deep venous thrombosis) ([facility])      LLE  . Hematochezia    . HTN (hypertension)    . Hypomagnesemia 03/20/2019  . Kidney transplant recipient    . PVD (peripheral vascular disease) ([place])        Family History  family history includes Diabetes in his brother, father, and sister; Heart disease in his brother and father; Stroke in his mother.   Prior Rehab/Hospitalizations:  Has the patient had prior rehab or hospitalizations prior to admission? Yes   Has the patient had major surgery during 100 days prior to admission? No   Current Medications    Current Facility-Administered Medications:  .  0.9 %  sodium chloride infusion, , Intravenous, Continuous, Shawna Clamp, MD, Last Rate: 100 mL/hr at 01/10/20 0807, New Bag at 01/10/20 0807 .  acetaminophen (TYLENOL) tablet 650 mg, 650 mg, Oral, Q6H PRN **OR** acetaminophen (TYLENOL) suppository 650 mg, 650 mg, Rectal, Q6H PRN, Karmen Bongo, MD .  apixaban Arne Cleveland) tablet 5 mg, 5 mg, Oral, BID, Rolla Flatten, RPH, 5 mg at 01/10/20 0805 .  artificial tears (LACRILUBE) ophthalmic ointment, , Left Eye, QHS PRN, Karmen Bongo, MD .  atorvastatin (LIPITOR) tablet 80 mg, 80 mg, Oral, q1800, Rinehuls, David L, PA-C, 80 mg at 01/09/20 1752 .  bisacodyl (DULCOLAX) EC tablet 5 mg, 5 mg, Oral, Daily PRN, Karmen Bongo, MD .  brimonidine (ALPHAGAN) 0.2 % ophthalmic solution 1 drop, 1 drop, Right Eye, BID, Karmen Bongo, MD, 1 drop at 01/10/20  [phone] .  cinacalcet (SENSIPAR) tablet 60 mg, 60 mg, Oral, Daily, Karmen Bongo, MD, 60 mg at 01/10/20 0905 .  docusate sodium (COLACE) capsule 100 mg, 100 mg, Oral, BID, Karmen Bongo, MD, 100 mg at 01/10/20 0805 .  dorzolamide-timolol (COSOPT) 22.3-6.8 MG/ML ophthalmic solution 1 drop, 1 drop, Right Eye, BID, Karmen Bongo, MD, 1 drop at 01/10/20 0811 .  famotidine (PEPCID) tablet 20 mg, 20 mg, Oral, Daily, Karmen Bongo, MD, 20 mg at 01/10/20 0806 .  finasteride (PROSCAR) tablet 5 mg, 5 mg, Oral, Daily, Karmen Bongo, MD, 5 mg at 01/10/20 0805 .  folic acid (FOLVITE) tablet 1 mg, 1 mg, Oral, Daily, Karmen Bongo, MD, 1 mg at 01/10/20 0806 .  hydrALAZINE (APRESOLINE) tablet 25 mg, 25 mg, Oral, Q6H, Swayze, Ava, DO, 25 mg at 01/10/20 0509 .  HYDROcodone-acetaminophen (NORCO/VICODIN) 5-325 MG per tablet 1-2 tablet, 1-2 tablet, Oral, Q4H PRN, Karmen Bongo, MD .  insulin aspart (novoLOG) injection 0-15 Units, 0-15 Units, Subcutaneous, TID WC, Karmen Bongo, MD, 3 Units at 01/10/20 [phone] .  insulin aspart (novoLOG) injection 0-5 Units, 0-5 Units, Subcutaneous, QHS, Karmen Bongo, MD, 2 Units at 01/08/20 2159 .  insulin glargine (LANTUS) injection 20 Units, 20 Units, Subcutaneous, Daily, Swayze, Ava, DO, 20 Units at 01/10/20 0905 .  magnesium oxide (MAG-OX) tablet 400 mg, 400 mg, Oral, Daily, Karmen Bongo, MD, 400 mg at 01/10/20 0806 .  metoprolol tartrate (LOPRESSOR) tablet 12.5 mg, 12.5 mg, Oral, BID, Opyd, Ilene Qua, MD, 12.5 mg at 01/10/20 0806 .  morphine 2 MG/ML injection 2 mg, 2 mg, Intravenous, Q2H PRN, Karmen Bongo, MD .  mycophenolate (MYFORTIC) EC tablet 360 mg, 360 mg, Oral, BID, Karmen Bongo, MD, 360 mg at 01/10/20 0905 .  NIFEdipine (PROCARDIA XL/NIFEDICAL XL) 24 hr tablet 60 mg, 60 mg, Oral, Daily, Karmen Bongo, MD, 60 mg at 01/10/20 0905 .  ondansetron (ZOFRAN) tablet 4 mg, 4 mg, Oral, Q6H PRN **OR** ondansetron (ZOFRAN) injection 4 mg, 4 mg, Intravenous, Q6H  PRN, Karmen Bongo, MD .  polyethylene glycol (MIRALAX / GLYCOLAX) packet 17 g, 17 g, Oral, Daily PRN, Karmen Bongo, MD .  Derrill Memo ON 01/24/2020] predniSONE (DELTASONE) tablet 10 mg, 10 mg, Oral, Q breakfast, Swayze, Ava, DO .  [START ON 01/21/2020] predniSONE (DELTASONE) tablet 20 mg, 20 mg, Oral, Q breakfast, Swayze, Ava, DO .  [START ON 01/18/2020] predniSONE (DELTASONE) tablet 30 mg, 30 mg, Oral, Q breakfast, Swayze, Ava, DO .  [START ON 01/15/2020] predniSONE (DELTASONE) tablet 40 mg, 40 mg, Oral, Q breakfast, Swayze, Ava, DO .  [START ON 01/12/2020] predniSONE (DELTASONE) tablet 50 mg, 50 mg, Oral, Q breakfast, Swayze, Ava, DO .  predniSONE (DELTASONE) tablet 60 mg, 60 mg, Oral, Q breakfast, Swayze, Ava, DO, 60 mg at 01/10/20 0805 .  sulfamethoxazole-trimethoprim (BACTRIM) 400-80 MG per tablet 1 tablet, 1 tablet, Oral, Q M,W,F, Karmen Bongo, MD, 1 tablet at 01/10/20 0905 .  tacrolimus (PROGRAF) capsule 2 mg, 2 mg, Oral, BID, Karmen Bongo, MD, 2 mg at 01/10/20 0905 .  tamsulosin (FLOMAX) capsule 0.4 mg, 0.4 mg, Oral, QPC breakfast, Karmen Bongo, MD, 0.4 mg at 01/10/20 5427 .  zolpidem (AMBIEN) tablet 5 mg, 5 mg, Oral, QHS PRN, Karmen Bongo, MD   Patients Current Diet:  Diet Order                      Diet - low sodium heart healthy           Diet Carb Modified           Diet heart healthy/carb modified Room service appropriate? 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  Independent   Indoor  Mobility: Did the patient need assistance with walking from room to room (with or without device)? Independent   Stairs: Did the patient need assistance with internal or external stairs (with or without device)? Independent   Functional Cognition: Did the patient need help planning regular tasks such as shopping or remembering to take medications? Independent   Home Assistive Devices / Equipment Home Equipment: Walker - 2 wheels   Prior Device Use: Indicate devices/aids used by the patient prior to current illness, exacerbation or injury? None of the above   Current Functional Level Cognition   Arousal/Alertness: Awake/alert Overall Cognitive Status: Impaired/Different from baseline Difficult to assess due to: (aphasia) Current Attention Level: Sustained Orientation Level: Disoriented to situation, Disoriented to time, Disoriented to place, Oriented to person Following Commands: Follows one step commands inconsistently, Follows one step commands with increased time Safety/Judgement: Decreased awareness of safety, Decreased awareness of deficits General Comments: pt unable to state name and with increased word salad this session; perseverating Attention: Sustained Sustained Attention: Impaired Sustained Attention Impairment: Verbal complex Memory: Impaired Memory Impairment: Decreased short term memory Decreased Short Term Memory: Verbal basic Awareness: Impaired Awareness Impairment: Intellectual impairment Problem Solving: Impaired Problem Solving Impairment: Verbal complex, Verbal basic Executive Function: Reasoning Reasoning: Impaired Reasoning Impairment: Verbal complex Behaviors: Perseveration Safety/Judgment: Impaired    Extremity Assessment (includes Sensation/Coordination)   Upper Extremity Assessment: Generalized weakness RUE Deficits / Details: WFL AROM with tremor RUE Coordination: (slow and deliberate finger to nose w/ mild intention tremor) LUE Coordination:  (slow and deliberate finger to nose w/ mild intention tremor)  Lower Extremity Assessment: Defer to PT evaluation     ADLs   Overall ADL's : Needs assistance/impaired Eating/Feeding: Set up, Bed level Grooming: Cueing for sequencing, Sitting, Standing Grooming Details (indicate cue type and reason): Completed in standing, no LOB, but ataxic movement noted throughout Upper Body Bathing: Minimal assistance Lower Body Bathing: Moderate assistance, Sit to/from stand Lower Body Bathing Details (indicate cue type and reason): Completed in standing at sink, min A for safety and balance Upper Body Dressing : Minimal assistance Lower Body Dressing: Moderate assistance, Sit to/from stand Toilet Transfer: Min guard, Cueing for safety, Cueing for sequencing Toilet Transfer Details (indicate cue type and reason): Simulated with transfer to recliner after functional ambulation Toileting- Clothing Manipulation and Hygiene: Minimal assistance Toileting - Clothing Manipulation Details (indicate cue type and reason): apparently had to urinate and pulled the trashcan next to him to urinate in rather than notifying therapist or asking to go to the bathroom. Pt given urinal and used appropriately Functional mobility during ADLs: Minimal assistance(HHA) General ADL Comments: Pt attempting to wash face     Mobility   Overal bed mobility: Needs Assistance Bed Mobility: Supine to Sit Supine to sit: Min assist General bed mobility comments: multimodal cues and assist to bring bilat LE to EOB and to elevate trunk into sitting      Transfers  Overall transfer level: Needs assistance Equipment used: 1 person hand held assist Transfers: Sit to/from Stand Sit to Stand: Min assist General transfer comment: assist to power up and to steady; pt stood several times due to bowel incontinence X 2 during session and standing for hygiene     Ambulation / Gait / Stairs / Wheelchair Mobility    Ambulation/Gait Ambulation/Gait assistance: Min assist, Mod assist Gait Distance (Feet): 100 Feet Assistive device: 1 person hand held assist, Rolling walker (2 wheeled) Gait Pattern/deviations: Trunk flexed, Step-through pattern, Decreased stride length, Drifts right/left, Antalgic, Decreased stance time - left, Decreased step length - right, Wide base of support General Gait Details: initially HHA however pt requiring increased assistance for balance and with very flexed posture; pt able to correct posture briefly with cues; assistance for balance and for managing RW as pt tends to keep RW too far away  Gait velocity: varying  Gait velocity interpretation: <1.8 ft/sec, indicate of risk for recurrent falls Stairs: Yes Stairs assistance: Min assist Stair Management: Two rails, Alternating pattern, Forwards Number of Stairs: (3 steps then 2 shorter steps) General stair comments: cues for safety and step to pattern; pt alternating pattern despite cues     Posture / Balance Balance Overall balance assessment: Needs assistance Sitting-balance support: No upper extremity supported, Feet supported Sitting balance-Leahy Scale: Good Standing balance support: Single extremity supported, During functional activity, Bilateral upper extremity supported Standing balance-Leahy Scale: Poor Standing balance comment: HHA used     Special needs/care consideration Continuous Drip IV  0.9% sodium chloride infusion , Skin abrasion to med/lower scrotum, Diabetic management yes and Designated visitor  wife: [name]   Pt is incontinent bowel and bladder. Last BM: 4/15        Previous Home Environment (from acute therapy documentation) Living Arrangements: Spouse/significant other  Lives With: Spouse, Family(grandaughter lives with them) Available Help at Discharge: Family, Available 24 hours/day Type of Home: House Home Layout: One level Home Access: Stairs to enter Entrance Stairs-Rails: None Entrance  Stairs-Number of Steps: 1 Bathroom Shower/Tub: Optometrist: Yes How Accessible: Accessible via walker [place]: No Additional Comments: no DME   Discharge Living Setting Plans for Discharge Living Setting: Patient's home Type of Home at Discharge: House Discharge Home Layout: One level Discharge Home Access: Stairs to enter Entrance Stairs-Rails: None Entrance Stairs-Number of Steps: 1 Discharge Bathroom Shower/Tub: Tub/shower unit Discharge Bathroom Toilet: Standard Discharge Bathroom Accessibility: Yes How Accessible: Accessible via walker Does the patient have any problems obtaining your medications?: Yes (Describe)(kidney transplant medication)   Social/Family/Support Systems Patient Roles: Spouse, Caregiver Contact Information: see below for [employer] Anticipated Caregiver: Elpidio Anis, wife and son/daughter-in-law Anticipated Caregiver's Contact Information: [phone] Ability/Limitations of Caregiver: Min A Caregiver Availability: 24/7 Discharge Plan Discussed with Primary Caregiver: Yes Is Caregiver In Agreement with Plan?: Yes Does Caregiver/Family have Issues with Lodging/Transportation while Pt is in Rehab?: No     Goals Patient/Family Goal for Rehab: Supervison-Min A:PT/OT/ST Expected length of stay: 12-15 days Pt/Family Agrees to Admission and willing to participate: Yes Program Orientation Provided & Reviewed with Pt/Caregiver Including Roles  & Responsibilities: Yes  Barriers to Discharge: Home environment access/layout  Barriers to Discharge Comments: steps to enter home.      Decrease burden of Care through IP rehab admission: NA     Possible need for SNF placement upon discharge: Not anticipated d/t acceptable supervision after d/c and anticipated achievement of CIR goals. Family aware that plan is to DC  home with support at end of CIR program.      Patient Condition: This patient's  medical and functional status has changed since the consult dated 01/07/20 in which the Rehabilitation Physician determined and documented that the patient was potentially appropriate for intensive rehabilitative care in an inpatient rehabilitation facility. Issues have been addressed and update has been discussed with [name] and patient now appropriate for inpatient rehabilitation as he now has a stable, confirmed dispo from family. Will admit to inpatient rehab today.    Preadmission Screen Completed By: Scherrie Gerlach with day of admit updates provided by:   Raechel Ache, OT, 01/10/2020 11:30 AM ______________________________________________________________________   Discussed status with [name] on 01/10/20 at 11:32AM and received approval for admission today.   Admission Coordinator:  Raechel Ache, time 11:32AM/Date 01/10/20         Revision History

## 2020-01-10 NOTE — Plan of Care (Signed)
Adequate for discharge.

## 2020-01-10 NOTE — Discharge Summary (Signed)
Physician Discharge Summary  [name] [id number] DOB: [birth year] DOA: 12/29/2019  PCP: Mina Marble, PA-C  Admit date: 12/29/2019 Discharge date: 01/10/2020  Admitted From: Home Disposition:  Rehabilitation  Recommendations for Outpatient Follow-up:  1. Follow up with PCP in 1-2 weeks 2. Please obtain BMP/CBC in 24 hrs. 3. Please follow up on the following pending results:  Home Health:No Equipment/Devices: None  Discharge Condition: Stable CODE STATUS:Full code Diet recommendation: Heart Healthy / Carb Modified / Regular / Dysphagia   Brief/Interim Summary: Ryan Rice a 69 y.o.malewith medical history significant ofPVD; renal transplant(2013); HTN; DM; and afib presenting with AMS.Patient with acute onset of confusion, expressive aphasia. 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 This is the source of his symptoms. Neurosurgery had planned to take the patient to the OR for a biopsy. However, as the patient was found to have new CVA on repeat MRI prior to planned biopsy, the brain biopsy was cancelled lieu of stroke work up. Neurology was consulted. [name] has evaluated the patient and feels that Brain Biopsy is no longer necessary. I have discussed the patient with [name]. He believes that the abnormality seen on MRI was infact a CVA. Stroke work up has been completed. The patient is found to be in atrial fibrillation. Initiation of anticoagulation has been discussed with [name]. As the petechial hemorrhages seen on MRI occurred more than a week ago, [name] recommended starting Eliquis. Pharmacy has been consulted to manage the initiation of Eliquis. Steroids are being tapered down to the patient's usual dose of prednisone 10 mg daily.Hemoglobin has been stable on Eliquis.Pt is on steroids to address vasogenic edema.  He is taking bactrim 3x a week for UTI prophylaxis.  Patient has been discharged to  rehab.  Patient's serum creatinine increased on the day of discharge.  Physicians at the rehab agreed to monitor serum creatinine.    He was managed for below problems.  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 This is the source of his symptoms. Neurosurgery had planned to take the patient to the OR for a biopsy. However, as the patient was found to have new CVA on repeat MRI prior to planned biopsy, the brain biopsy was cancelled lieu of stroke work up. Neurology was consulted. [name] has evaluated the patient and feels that Brain Biopsy is no longer necessary. I have discussed the patient with [name]. He believes that the abnormality seen on MRI was infact a CVA. Stroke work up has been completed. The patient is found to be in atrial fibrillation. Initiation of anticoagulation has been discussed with [name]. As the petechial hemorrhages seen on MRI occurred more than a week ago, [name] recommended starting Eliquis. Pharmacy has been consulted to manage the initiation of Eliquis. Steroids are being tapered down to the patient's usual dose of prednisone 10 mg daily.Hemoglobin has been stable on Eliquis.  Aniscoria: Likely due to brain mass. Unlikely to be due to CVA per neurology. Pt is on steroids to address vasogenic edema. Plan is for brain biopsy after stroke work up is completed.   Elevated PSA: Patient had planned to have prostate biopsy on 01/01/2020 at [facility]. Family is deciding if they want the patient to go ahead and have the procedure done here, or wait and have it  done at [facility] as originally planned. The patient is also experiencing urinary incontinence. He will be continued on proscar and flomax. He is taking bactrim 3x a week for UTI prophylaxis.  AKI with h/o renal transplantation: Worsening. No recent labs since almost a  year ago, but currently with significantly worsened creatinine/GFR.This may be associated with decreased PO intake in the setting of recent confusion. Creatinine is increasing. Will check a renal ultrasound. Continue IV fluids. Also will avoid nephrotoxic medications and hypotension. He will be continued on Sensipar. Voltaren gel will be held. He will be continued on prograf and myfortic. As creatinine is increasing, will consult nephrology. I appreciate their help  DM II: Recent A1c 6.8, indicating good control.PO meds (Januvia, Gluocphage, Starlix, Actos) have been held. He will receive Lantus 14 units. His glucoses will be followed with FSBS and SSI. For the last 24 hours glucoses have run from 198- 279.Steroids are being weaned.  HTN: Continue Nifedipine and Labetalol. Blood pressures under fair control.  Atrial fibrillation: Rate controlled with labetalol.Coumadin is on hold due to upcoming prostate biopsy/brain biopsy/petechial hemorrhages on MRI Brain. Monitor on telemetry.He is on Eliquis and has been stable on this for 3 days.  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 For now, pain management as needed with eventual plan for orthopedics consult as outpatient and likely to need THR.  Glaucoma: Continue Alphagan, Cosopt  HLD: Continue Lipitor  Hyperkalemia: Improved with IV fluids. Monitor.   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   Call MD for:  difficulty breathing, headache or visual disturbances   Complete by: As directed    Call MD for:  persistant dizziness or light-headedness   Complete by: As directed    Call MD for:  persistant nausea and vomiting   Complete by: As directed    Call MD for:  persistant nausea and vomiting   Complete by: As directed    Call MD for:  severe uncontrolled pain   Complete by: As directed    Call MD for:  severe uncontrolled pain   Complete by: As directed    Call MD for:  temperature >100.4   Complete by: As directed    Diet - low sodium heart healthy   Complete by: As directed    Diet - low sodium heart healthy   Complete by: As directed    Diet Carb Modified   Complete by: As directed    Discharge instructions   Complete by: As directed    Discharge to home with home health PT/OT. Follow up with PCP in 7-10 days Follow up with Stroke clinic in 4-6 weeks. No driving until cleared by neurology   Discharge instructions   Complete by: As directed    Advised to recheck his CBC and CMP in 24 hours.  Increase activity slowly   Complete by: As directed    Increase activity slowly   Complete by: As directed      Allergies as of 01/10/2020      Reactions   Iodinated Diagnostic Agents Hives   Tape Hives      Medication List    STOP taking these medications   labetalol 100 MG tablet Commonly known as: NORMODYNE   warfarin 5 MG tablet Commonly known as: COUMADIN     TAKE these medications   apixaban 5 MG Tabs tablet Commonly known as: ELIQUIS Take 1 tablet (5 mg total) by mouth 2 (two) times daily.   artificial tears Oint ophthalmic ointment Commonly known as: LACRILUBE Place into the left eye at bedtime as needed for dry eyes.   atorvastatin 80 MG tablet Commonly known as: LIPITOR Take 1 tablet (80 mg total) by mouth daily at 6 PM. What changed:   medication strength  how much to take    brimonidine 0.2 % ophthalmic solution Commonly known as: ALPHAGAN Place 1 drop into the right eye 2 (two) times daily.   cinacalcet 60 MG tablet Commonly known as: SENSIPAR Take 60 mg by mouth daily.   diclofenac Sodium 1 % Gel Commonly known as: VOLTAREN Apply 4 g topically 2 (two) times daily. Apply to hips and legs   docusate sodium 100 MG capsule Commonly known as: COLACE Take 1 capsule (100 mg total) by mouth 2 (two) times daily.   dorzolamide-timolol 22.3-6.8 MG/ML ophthalmic solution Commonly known as: COSOPT Place 1 drop into the right eye 2 (two) times a day.   famotidine 20 MG tablet Commonly known as: PEPCID Take 1 tablet (20 mg total) by mouth daily.   finasteride 5 MG tablet Commonly known as: PROSCAR Take 5 mg by mouth daily.   folic acid 1 MG tablet Commonly known as: FOLVITE TAKE 1 TABLET BY MOUTH EVERY DAY   hydrALAZINE 25 MG tablet Commonly known as: APRESOLINE Take 1 tablet (25 mg total) by mouth every 6 (six) hours.   Januvia 100 MG tablet Generic drug: sitaGLIPtin Take 100 mg by mouth daily.   magnesium oxide 400 MG tablet Commonly known as: MAG-OX Take 400 mg by mouth daily.   metFORMIN 500 MG 24 hr tablet Commonly known as: GLUCOPHAGE-XR Take 500 mg by mouth 2 (two) times daily.   metoprolol tartrate 25 MG tablet Commonly known as: LOPRESSOR Take 0.5 tablets (12.5 mg total) by mouth 2 (two) times daily.   mycophenolate 180 MG EC tablet Commonly known as: MYFORTIC Take 360 mg by mouth 2 (two) times daily.   nateglinide 120 MG tablet Commonly known as: STARLIX Take 120 mg by mouth 2 (two) times daily before a meal. On hold if BG is < 130   NIFEdipine 60 MG 24 hr tablet Commonly known as: ADALAT CC Take 60 mg by mouth daily.   pioglitazone 30 MG tablet Commonly known as: ACTOS Take 15 mg by mouth daily.   predniSONE 20 MG tablet Commonly known as: DELTASONE Take 3 tablets (60 mg total) by mouth daily with breakfast for 3  days. Start taking on: January 11, 2020 What changed:   medication strength  how much to take   predniSONE 50 MG tablet Commonly known as: DELTASONE Take 6 tablets (60 mg total) by mouth daily with breakfast for 3 days, THEN 5 tablets (50 mg total) daily with breakfast for 3 days, THEN 4 tablets (40 mg total) daily with breakfast for 3 days,  THEN 3 tablets (30 mg total) daily with breakfast for 3 daysTHEN 2 tablets ( 20 mg total ) for 3 days and then 1 tablet 10 mg for three days. Start taking on: January 12, 2020 What changed: You were already taking a medication with the same name, and this prescription was added. Make sure you understand how and when to take each.   predniSONE 20 MG tablet Commonly known as: DELTASONE Take 2 tablets (40 mg total) by mouth daily with breakfast. Start taking on: January 15, 2020 What changed: You were already taking a medication with the same name, and this prescription was added. Make sure you understand how and when to take each.   predniSONE 10 MG tablet Commonly known as: DELTASONE Take 3 tablets (30 mg total) by mouth daily with breakfast. Start taking on: January 18, 2020 What changed: You were already taking a medication with the same name, and this prescription was added. Make sure you understand how and when to take each.   predniSONE 20 MG tablet Commonly known as: DELTASONE Take 1 tablet (20 mg total) by mouth daily with breakfast. Start taking on: January 21, 2020 What changed: You were already taking a medication with the same name, and this prescription was added. Make sure you understand how and when to take each.   sulfamethoxazole-trimethoprim 400-80 MG tablet Commonly known as: BACTRIM Take 1 tablet by mouth every Monday, Wednesday, and Friday.   tacrolimus 1 MG capsule Commonly known as: PROGRAF Take 2 mg by mouth 2 (two) times daily.   tamsulosin 0.4 MG Caps capsule Commonly known as: FLOMAX Take 0.4 mg by mouth daily after  breakfast.     ASK your doctor about these medications   dexamethasone 4 MG tablet Commonly known as: Decadron Take 1 tablet (4 mg total) by mouth 3 (three) times daily for 2 days. Ask about: Should I take this medication?      Follow-up Information    [facility] Follow up.   Specialty: Home Health Services Why: The home health agency will contact you for the first home visit. Contact information: [place] [address] [phone]          Allergies  Allergen Reactions  . Iodinated Diagnostic Agents Hives  . Tape Hives    Consultations:  Nephrology   Procedures/Studies: DG Ankle Complete Left  Result Date: 12/29/2019 CLINICAL DATA:  Pain status post fall EXAM: LEFT ANKLE COMPLETE - 3+ VIEW COMPARISON:  None. FINDINGS: There is no acute displaced fracture. No dislocation. There are degenerative changes of the mortise joint. Advanced vascular calcifications are noted. IMPRESSION: No acute displaced fracture or dislocation. Degenerative changes of the mortise joint. Advanced vascular calcifications. Electronically Signed   By: Constance Holster M.D.   On: 12/29/2019 20:46   CT HEAD WO CONTRAST  Result Date: 01/06/2020 CLINICAL DATA:  Stroke, follow-up. EXAM: CT HEAD WITHOUT CONTRAST TECHNIQUE: Contiguous axial images were obtained from the base of the skull through the vertex without intravenous contrast. COMPARISON:  Prior head CT examinations 01/04/2020 and earlier, brain MRI 01/02/2020, brain MRI 12/30/2019. FINDINGS: Brain: No significant change in size of a paramedian anterior left frontal lobe lesion. As before, there is associated petechial hemorrhage without frank hemorrhagic conversion. Also unchanged, there is partial effacement of the anterior horn of the left lateral ventricle. No midline shift. An evolving subacute ischemic infarct involving portions of the left temporoparietal and occipital lobes has not significantly  changed in size.  Question minimal associated petechial hemorrhage. No frank hemorrhagic conversion. No new demarcated cortical infarct is identified. Redemonstrated small chronic cortically based infarcts within the right parietal and occipital lobes. Stable background chronic small vessel ischemic disease and generalized parenchymal atrophy again. No hydrocephalus. No extra-axial fluid collection. Vascular: No hyperdense vessel. Skull: Normal. Negative for fracture or focal lesion. Sinuses/Orbits: Visualized orbits demonstrate no acute abnormality. Minimal ethmoid sinus mucosal thickening. No significant mastoid effusion. IMPRESSION: 1. Unchanged size and appearance of a lesion involving the paramedian anterior left frontal lobe. Please refer to prior brain MRI 01/02/2020 for differential considerations. Unchanged petechial hemorrhage associated with this lesion. 2. An evolving subacute ischemic infarct involving portions of the left temporoparietal and occipital lobes has not significantly changed in size. There may be minimal associated petechial hemorrhage without frank hemorrhagic conversion. 3. No worsening mass effect or midline shift. 4. Redemonstrated chronic cortically based infarcts within the right parietal and occipital lobes. 5. Stable background generalized parenchymal atrophy and chronic small vessel ischemic disease. Electronically Signed   By: Kellie Simmering DO   On: 01/06/2020 13:21   CT HEAD WO CONTRAST  Result Date: 01/04/2020 CLINICAL DATA:  Follow-up examination for acute stroke. EXAM: CT HEAD WITHOUT CONTRAST TECHNIQUE: Contiguous axial images were obtained from the base of the skull through the vertex without intravenous contrast. COMPARISON:  Prior MRIs from 01/02/2020 and 12/30/2019 as well as prior CT from 12/29/2019. FINDINGS: Brain: Probable of all vein subacute left ACA territory infarct involving the parasagittal and anterior left frontal lobe again seen. Overall distribution and  size of the area of involvement is stable from prior CT. Probable petechial hemorrhage along the periphery of this area is relatively stable as compared to most recent MRI. No frank intraparenchymal hematoma or hemorrhagic transformation. No significant regional mass effect or midline shift. Additional evolving ischemic infarct involving the posterior left temporoparietal and occipital region again seen, also relatively stable from previous MRI. No evidence for hemorrhagic transformation or significant regional mass effect. No other new areas of large vessel territory infarct. Small chronic posterior right frontal infarct again noted. No other new areas of intracranial hemorrhage. Underlying atrophy with chronic small vessel ischemic disease again noted. No midline shift or hydrocephalus. No extra-axial fluid collection. Vascular: No hyperdense vessel. Skull: Scalp soft tissues and calvarium within normal limits. Sinuses/Orbits: Globes and orbital soft tissues within normal limits. Paranasal sinuses are clear. No mastoid effusion. Other: None. IMPRESSION: 1. No significant interval change in size of evolving subacute left ACA territory infarct with similar associated petechial hemorrhage. No evidence for frank hemorrhagic transformation or intraparenchymal hematoma. 2. Stable size of additional evolving ischemic infarct involving the posterior left temporoparietal and occipital region. No evidence for hemorrhagic transformation or significant regional mass effect. 3. No other new acute intracranial abnormality. Electronically Signed   By: Jeannine Boga M.D.   On: 01/04/2020 06:10   CT Head Wo Contrast  Result Date: 12/29/2019 CLINICAL DATA:  Confusion EXAM: CT HEAD WITHOUT CONTRAST TECHNIQUE: Contiguous axial images were obtained from the base of the skull through the vertex without intravenous contrast. COMPARISON:  None. FINDINGS: Brain: There is edema within the left frontal lobe concerning for  underlying frontal lobe mass lesion with surrounding edema. No midline shift or hemorrhage. Vascular: No hyperdense vessel or unexpected calcification. Skull: No acute calvarial abnormality. Sinuses/Orbits: Visualized paranasal sinuses and mastoids clear. Orbital soft tissues unremarkable. Other: None IMPRESSION: Edema within the anterior left frontal lobe concerning for vasogenic edema surrounding and underlying mass lesion.  Recommend further evaluation with MRI with and without contrast. Electronically Signed   By: Rolm Baptise M.D.   On: 12/29/2019 20:50   MR ANGIO HEAD WO CONTRAST  Result Date: 01/03/2020 CLINICAL DATA:  Left frontal lobe lesion. Rule out tumor versus stroke. EXAM: MRA HEAD WITHOUT CONTRAST TECHNIQUE: Angiographic images of the Circle of Willis were obtained using MRA technique without intravenous contrast. COMPARISON:  MRI head 01/02/2020 FINDINGS: Both vertebral arteries widely patent to the basilar. PICA not visualized. Prominent AICA patent bilaterally. Basilar widely patent. Superior cerebellar and posterior cerebral arteries patent bilaterally without stenosis. Fetal origin left posterior cerebral artery. Atherosclerotic disease in the cavernous carotid bilaterally with mild stenosis bilaterally. Hypoplastic left A1 segment. Both anterior cerebral arteries are patent supplied from the right. Middle cerebral arteries patent bilaterally without stenosis. Negative for aneurysm or vascular malformation. IMPRESSION: Negative MRI head Electronically Signed   By: Franchot Gallo M.D.   On: 01/03/2020 12:17   MR BRAIN WO CONTRAST  Addendum Date: 01/02/2020   ADDENDUM REPORT: 01/02/2020 19:23 ADDENDUM: Study discussed by telephone with PA Ferne Reus and Dr. Marylyn Ishihara CABBELL. And we agreed that the stereotactic brain biopsy should be deferred for now, in favor of further stroke workup. Electronically Signed   By: Genevie Ann M.D.   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 Nonenhancing lesion, stereotactic surgical planning. EXAM: MRI HEAD WITHOUT CONTRAST TECHNIQUE: Multiplanar, multiecho pulse sequences of the brain and surrounding structures were obtained without intravenous contrast. COMPARISON:  Recent brain MRI without and with contrast 12/30/2019. FINDINGS: Brain: Relatively large 6-7 cm area of confluent abnormal T2 and FLAIR hyperintensity redemonstrated in the anterior left frontal lobe, affecting both white matter and overlying cortex (series 4, image 21). Superimposed confluent and gyriform susceptibility along the margins of the lesion, especially medially, suggests significant hemosiderin, and these areas were mildly hyperdense recently on noncontrast CT. Stable regional mass effect, most notably on the left frontal horn. There is abnormal diffusion restriction, a especially along the anterior superior margin of the mass on series 550, image 41), although some of the diffusion abnormality could also reflect susceptibility. However, there is also new gyriform abnormal diffusion in the posterior and lateral left temporal and occipital lobes (series 550, image 26) with mild associated gyriform FLAIR hyperintensity. And interestingly there is also punctate new susceptibility artifact along the anterior margin of that abnormality on series 7, image 59. No other abnormal diffusion. Chronic cortical encephalomalacia in the contralateral posterior right operculum and also at the occipital lobe is again noted. And these areas also demonstrate hemosiderin. No midline shift, mass effect, evidence of mass lesion, ventriculomegaly, extra-axial collection. Cervicomedullary junction and pituitary are within normal limits. Vascular: Major intracranial vascular flow voids are stable, preserved. Skull and upper cervical spine: Partially visible multilevel advanced cervical spine degeneration on series 3 today. Multilevel mild  spinal stenosis. Visualized bone marrow signal is within normal limits. Sinuses/Orbits: Stable, negative. Other: Mastoids remain clear. IMPRESSION: 1. Stable anterior left frontal lobe lesion from the recent 12/30/2019 MRI, which in addition to confluent T2/FLAIR hyperintensity demonstrates abundant susceptibility artifact suggesting blood products. 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 New confluent area of posterior left hemisphere gyriform restricted diffusion, with only minimal FLAIR hyperintensity at this time but also punctate new susceptibility. Furthermore, there are small areas of chronic encephalomalacia in the right hemisphere with hemosiderin. 3. Therefore, consider an  alternative diagnosis for #1 of Subacute Infarct with petechial hemorrhage. Venous Infarcts could also have this appearance, although the superior sagittal sinus appears patent. Other stroke/tumor mimics such as MELAS were considered but felt less likely. CTA head and neck may be valuable considering the acute ischemia in #2. Electronically Signed: By: Genevie Ann M.D. On: 01/02/2020 18:33   MR BRAIN W WO CONTRAST  Result Date: 12/30/2019 CLINICAL DATA:  Encephalopathy. EXAM: MRI HEAD WITHOUT AND WITH CONTRAST TECHNIQUE: Multiplanar, multiecho pulse sequences of the brain and surrounding structures were obtained without and with intravenous contrast. CONTRAST:  74mL GADAVIST GADOBUTROL 1 MMOL/ML IV SOLN COMPARISON:  Head CT 12/29/2019 FINDINGS: BRAIN: There is a large area of abnormal signal within the left frontal lobe, with areas of restricted diffusion, facilitated diffusion and magnetic susceptibility effect. There is no associated contrast enhancement. There is mass effect on the frontal horn of the left lateral ventricle with 1-2 mm of rightward midline shift of the left cingulate gyrus. No hydrocephalus. 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 Midline structures are normal. There  is magnetic susceptibility effect at multiple locations within the left frontal lesion, roughly corresponding to relatively hyperdense areas on the earlier head CT. VASCULAR: Major flow voids are preserved. Susceptibility-sensitive sequences show no chronic microhemorrhage or superficial siderosis. SKULL AND UPPER CERVICAL SPINE: Normal calvarium and skull base. Visualized upper cervical spine and soft tissues are normal. SINUSES/ORBITS: No paranasal sinus fluid levels or advanced mucosal thickening. No mastoid or middle ear effusion. Normal orbits. IMPRESSION: 1. Area of vasogenic edema within the left frontal lobe without associated contrast enhancement. 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 2. Areas of magnetic susceptibility effect within the periphery of the left frontal lobe lesion may indicate venous congestion or late subacute to chronic petechial hemorrhage. 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 Electronically Signed   By: Ulyses Jarred M.D.   On: 12/30/2019 01:48   US Renal Transplant w/Doppler  Result Date: 01/08/2020 CLINICAL DATA:  Acute kidney injury, previous renal transplant EXAM: ULTRASOUND OF RENAL TRANSPLANT WITH RENAL DOPPLER ULTRASOUND TECHNIQUE: Ultrasound examination of the renal transplant was performed with gray-scale, color and duplex doppler evaluation. COMPARISON:  03/01/2019 FINDINGS: Transplant kidney location: RLQ Transplant Kidney: Renal measurements: 9.6 x 5.8 x 7.1 = volume: 245mL (previously 237). Normal in size and parenchymal echogenicity. No evidence of mass or hydronephrosis. No peri-transplant fluid collection seen. Color flow in the main renal artery:  Yes Color flow in the main renal vein:  Yes Duplex Doppler Evaluation: Main Renal Artery Resistive Index: 0.8 Venous waveform in main renal vein:  Present Intrarenal resistive index in upper pole:  0.72 (normal 0.6-0.8; equivocal 0.8-0.9; abnormal >= 0.9) Intrarenal  resistive index in lower pole: 0.71 (normal 0.6-0.8; equivocal 0.8-0.9; abnormal >= 0.9) Bladder: Incompletely distended, unremarkable. No ureteral jets demonstrated. Other findings:  None. IMPRESSION: 1. Stable appearance of transplant kidney.  No hydronephrosis. Electronically Signed   By: Lucrezia Europe M.D.   On: 01/08/2020 08:32   EEG adult  Result Date: 12/30/2019 Lora Havens, MD     12/30/2019  5:01 PM Patient Name: Ryan Rice MRN: [id number] Epilepsy Attending: Lora Havens Referring Physician/Provider: Dr. Karmen Bongo Date: 12/30/2019 Duration: 24.26 mins Patient history: 69 year old male who presented for episodes of transient alteration of awareness. EEG to evaluate for seizures. Level of alertness: Awake AEDs during EEG study: None Technical aspects: This EEG study was done with  scalp electrodes positioned according to the 10-20 International system of electrode placement. Electrical activity was acquired at a sampling rate of 500Hz  and reviewed with a high frequency filter of 70Hz  and a low frequency filter of 1Hz . EEG data were recorded continuously and digitally stored. Description: No clear posterior dominant rhythm was seen. EEG showed generalized polymorphic 8-10 hz alpha activity as well as intermittent left frontal 2-3hz  delta slowing. Hyperventilation and photic stimulation were not performed. ABNORMALITY - Intermittent slow, left frontal IMPRESSION: This study is suggestive of cortical dysfunction in left frontal region likely secondary to underlying mass. No seizures or definite epileptiform discharges were seen throughout the recording. Lora Havens   MR MRV HEAD WO CM  Result Date: 01/03/2020 CLINICAL DATA:  Stroke EXAM: MR VENOGRAM OF THE HEAD WITHOUT CONTRAST TECHNIQUE: Angiographic images of the intracranial venous structures were obtained using MRV technique without intravenous contrast. COMPARISON:  MRI head 01/02/2020 FINDINGS: Superior sagittal sinus widely patent  normal. Right transverse sinus is dominant and widely patent. Right sigmoid sinus and jugular vein patent. Proximal left transverse sinus is not visualized. Small left sigmoid sinus and jugular vein appear patent. 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 IMPRESSION: Negative for venous sinus thrombosis. Hypoplastic left transverse sinus appears congenital. This had a similar appearance on the prior MRI brain with contrast on 12/30/2019 Electronically Signed   By: Franchot Gallo M.D.   On: 01/03/2020 12:36   ECHOCARDIOGRAM COMPLETE  Result Date: 01/03/2020    ECHOCARDIOGRAM REPORT   Patient Name:   ETTA Avitia Date of Exam: 01/03/2020 Medical Rec #:  [id number]     Height:       71.0 in Accession #:    [id number]    Weight:       191.0 lb Date of Birth:  April 26, 1951    BSA:          2.068 m Patient Age:    89 years      BP:           139/53 mmHg Patient Gender: M             HR:           79 bpm. Exam Location:  Inpatient Procedure: 2D Echo Indications:    Stroke 434.91 / I163.9  History:        Patient has prior history of Echocardiogram examinations, most                 recent 03/03/2019. PAD, Arrythmias:Atrial Fibrillation; Risk                 Factors:Diabetes. Kidney transplant. Past history of DVT.  Sonographer:    Darlina Sicilian RDCS Referring Phys: [id number] AVA SWAYZE  Sonographer Comments: Technically challenging study due to limited acoustic windows. IMPRESSIONS  1. Left ventricular ejection fraction, by estimation, is 55 to 60%. The left ventricle has normal function. The left ventricle has no regional wall motion abnormalities. There is mild concentric left ventricular hypertrophy. Left ventricular diastolic function could not be evaluated.  2. Right ventricular systolic function is normal. The right ventricular size is normal. Tricuspid regurgitation signal is inadequate for assessing PA pressure.  3. Left atrial size was moderately dilated.  4. Right atrial size was mild to moderately  dilated.  5. The mitral valve is normal in structure. No evidence of mitral valve regurgitation. No evidence of mitral stenosis.  6. The aortic valve is tricuspid. Aortic valve regurgitation is not visualized. Mild  aortic valve stenosis. Comparison(s): Prior images reviewed side by side. The left ventricular function has improved. FINDINGS  Left Ventricle: Left ventricular ejection fraction, by estimation, is 55 to 60%. The left ventricle has normal function. The left ventricle has no regional wall motion abnormalities. The left ventricular internal cavity size was normal in size. There is  mild concentric left ventricular hypertrophy. Left ventricular diastolic function could not be evaluated due to atrial fibrillation. Left ventricular diastolic function could not be evaluated. Right Ventricle: The right ventricular size is normal. No increase in right ventricular wall thickness. Right ventricular systolic function is normal. Tricuspid regurgitation signal is inadequate for assessing PA pressure. Left Atrium: Left atrial size was moderately dilated. Right Atrium: Right atrial size was mild to moderately dilated. Pericardium: There is no evidence of pericardial effusion. Mitral Valve: The mitral valve is normal in structure. Mild to moderate mitral annular calcification. No evidence of mitral valve regurgitation. No evidence of mitral valve stenosis. Tricuspid Valve: The tricuspid valve is normal in structure. Tricuspid valve regurgitation is not demonstrated. Aortic Valve: The aortic valve is tricuspid. . There is mild thickening and mild calcification of the aortic valve. Aortic valve regurgitation is not visualized. Mild aortic stenosis is present. There is mild thickening of the aortic valve. There is mild  calcification of the aortic valve. Aortic valve mean gradient measures 6.8 mmHg. Aortic valve peak gradient measures 14.6 mmHg. Aortic valve area, by VTI measures 1.72 cm. Pulmonic Valve: The pulmonic valve  was not well visualized. Pulmonic valve regurgitation is not visualized. Aorta: The aortic root is normal in size and structure. IAS/Shunts: No atrial level shunt detected by color flow Doppler.  LEFT VENTRICLE PLAX 2D LVIDd:         4.90 cm      Diastology LVIDs:         3.80 cm      LV e' lateral:   7.62 cm/s LV PW:         1.30 cm      LV E/e' lateral: 13.3 LV IVS:        1.10 cm      LV e' medial:    6.74 cm/s LVOT diam:     2.15 cm      LV E/e' medial:  15.1 LV SV:         64 LV SV Index:   31 LVOT Area:     3.63 cm  LV Volumes (MOD) LV vol d, MOD A2C: 175.0 ml LV vol d, MOD A4C: 156.0 ml LV vol s, MOD A2C: 79.1 ml LV vol s, MOD A4C: 74.1 ml LV SV MOD A2C:     95.9 ml LV SV MOD A4C:     156.0 ml LV SV MOD BP:      86.1 ml RIGHT VENTRICLE TAPSE (M-mode): 1.8 cm LEFT ATRIUM             Index LA diam:        4.70 cm 2.27 cm/m LA Vol (A2C):   92.0 ml 44.49 ml/m LA Vol (A4C):   79.9 ml 38.64 ml/m LA Biplane Vol: 90.8 ml 43.91 ml/m  AORTIC VALVE AV Area (Vmax):    1.96 cm AV Area (Vmean):   2.01 cm AV Area (VTI):     1.72 cm AV Vmax:           191.00 cm/s AV Vmean:          119.250 cm/s AV VTI:  0.374 m AV Peak Grad:      14.6 mmHg AV Mean Grad:      6.8 mmHg LVOT Vmax:         103.00 cm/s LVOT Vmean:        65.900 cm/s LVOT VTI:          0.177 m LVOT/AV VTI ratio: 0.47  AORTA Ao Root diam: 3.35 cm MITRAL VALVE MV Area (PHT): 3.56 cm     SHUNTS MV Decel Time: 213 msec     Systemic VTI:  0.18 m MV E velocity: 101.53 cm/s  Systemic Diam: 2.15 cm Dani Gobble Croitoru MD Electronically signed by Sanda Klein MD Signature Date/Time: 01/03/2020/4:19:48 PM    Final    DG Hip Unilat W or Wo Pelvis 2-3 Views Left  Result Date: 12/29/2019 CLINICAL DATA:  Pain status post fall EXAM: DG HIP (WITH OR WITHOUT PELVIS) 2-3V LEFT COMPARISON:  None. FINDINGS: There are end-stage degenerative changes of both hips, left worse than right. There is no displaced fracture. No dislocation. Vascular calcifications are noted.  IMPRESSION: 1. No displaced fracture or dislocation. 2. End-stage degenerative changes of both hips, left worse than right. Electronically Signed   By: Constance Holster M.D.   On: 12/29/2019 20:45   VAS US CAROTID  Result Date: 01/04/2020 Carotid Arterial Duplex Study Indications:       CVA. Risk Factors:      None. Comparison Study:  No prior studies. Performing Technologist: Oliver Hum RVT  Examination Guidelines: A complete evaluation includes B-mode imaging, spectral Doppler, color Doppler, and power Doppler as needed of all accessible portions of each vessel. Bilateral testing is considered an integral part of a complete examination. Limited examinations for reoccurring indications may be performed as noted.  Right Carotid Findings: +----------+-------+-------+--------+---------------------------------+--------+           PSV    EDV    StenosisPlaque Description               Comments           cm/s   cm/s                                                     +----------+-------+-------+--------+---------------------------------+--------+ CCA Prox  81     0              smooth and heterogenous          tortuous +----------+-------+-------+--------+---------------------------------+--------+ CCA Distal67     8              heterogenous, irregular and                                               calcific                                  +----------+-------+-------+--------+---------------------------------+--------+ ICA Prox  35     9              calcific                                  +----------+-------+-------+--------+---------------------------------+--------+  ICA Distal46     15                                              tortuous +----------+-------+-------+--------+---------------------------------+--------+ ECA       51     0                                                         +----------+-------+-------+--------+---------------------------------+--------+ +----------+--------+-------+--------+-------------------+           PSV cm/sEDV cmsDescribeArm Pressure (mmHG) +----------+--------+-------+--------+-------------------+ Subclavian160                                        +----------+--------+-------+--------+-------------------+ +---------+--------+--+--------+-+---------+ VertebralPSV cm/s44EDV cm/s5Antegrade +---------+--------+--+--------+-+---------+  Left Carotid Findings: +----------+-------+-------+--------+---------------------------------+--------+           PSV    EDV    StenosisPlaque Description               Comments           cm/s   cm/s                                                     +----------+-------+-------+--------+---------------------------------+--------+ CCA Prox  88     16             smooth and heterogenous                   +----------+-------+-------+--------+---------------------------------+--------+ CCA Distal58     6              smooth and heterogenous                   +----------+-------+-------+--------+---------------------------------+--------+ ICA Prox  34     8              irregular, heterogenous and      tortuous                                 calcific                                  +----------+-------+-------+--------+---------------------------------+--------+ ICA Distal48     12                                              tortuous +----------+-------+-------+--------+---------------------------------+--------+ ECA       63     0                                                        +----------+-------+-------+--------+---------------------------------+--------+ +----------+--------+--------+--------+-------------------+  PSV cm/sEDV cm/sDescribeArm Pressure (mmHG) +----------+--------+--------+--------+-------------------+ [id number]                                          +----------+--------+--------+--------+-------------------+ +---------+--------+--+--------+-+---------+ VertebralPSV cm/s42EDV cm/s7Antegrade +---------+--------+--+--------+-+---------+   Summary: Right Carotid: Velocities in the right ICA are consistent with a 1-39% stenosis. Left Carotid: Velocities in the left ICA are consistent with a 1-39% stenosis. Vertebrals: Bilateral vertebral arteries demonstrate antegrade flow. *See table(s) above for measurements and observations.  Electronically signed by Antony Contras MD on 01/04/2020 at 11:45:11 AM.    Final        Subjective: Patient was seen and examined at bedside.  He is very alert,  has eaten his breakfast.  No overnight events.  Discharge Exam: Vitals:   01/10/20 0809 01/10/20 1152  BP: 129/68 107/64  Pulse: 66 76  Resp: 16 20  Temp: 98.6 F (37 C) 98.4 F (36.9 C)  SpO2: 98% 98%   Vitals:   01/10/20 0008 01/10/20 0442 01/10/20 0809 01/10/20 1152  BP: 113/63 123/70 129/68 107/64  Pulse: 77 60 66 76  Resp: 18 18 16 20   Temp: 98 F (36.7 C) 98.5 F (36.9 C) 98.6 F (37 C) 98.4 F (36.9 C)  TempSrc: Oral Oral Oral Oral  SpO2: 97% 100% 98% 98%  Weight:      Height:        General: Pt is alert, awake, not in acute distress Cardiovascular: RRR, S1/S2 +, no rubs, no gallops Respiratory: CTA bilaterally, no wheezing, no rhonchi Abdominal: Soft, NT, ND, bowel sounds + Extremities: no edema, no cyanosis    The results of significant diagnostics from this hospitalization (including imaging, microbiology, ancillary and laboratory) are listed below for reference.     Microbiology: Recent Results (from the past 240 hour(s))  Surgical PCR screen     Status: None   Collection Time: 01/01/20 10:25 PM   Specimen: Nasal Mucosa; Nasal Swab  Result Value Ref Range Status   MRSA, PCR NEGATIVE NEGATIVE Final   Staphylococcus aureus NEGATIVE NEGATIVE Final    Comment: (NOTE) The Xpert  SA Assay (FDA approved for NASAL specimens in patients 22 years of age and older), is one component of a comprehensive surveillance program. It is not intended to diagnose infection nor to guide or monitor treatment. Performed at [facility], [place] [address], [place], [place] [address]   SARS CORONAVIRUS 2 (TAT 6-24 HRS) Nasopharyngeal Nasopharyngeal Swab     Status: None   Collection Time: 01/07/20  2:24 PM   Specimen: Nasopharyngeal Swab  Result Value Ref Range Status   SARS Coronavirus 2 NEGATIVE NEGATIVE Final    Comment: (NOTE) SARS-CoV-2 target nucleic acids are NOT DETECTED. The SARS-CoV-2 RNA is generally detectable in upper and lower respiratory specimens during the acute phase of infection. Negative results do not preclude SARS-CoV-2 infection, do not rule out co-infections with other pathogens, and should not be used as the sole basis for treatment or other patient management decisions. Negative results must be combined with clinical observations, patient history, and epidemiological information. The expected result is Negative. Fact Sheet for Patients: SugarRoll.be Fact Sheet for Healthcare Providers: https://www.woods-mathews.com/ This test is not yet approved or cleared by the [place] FDA and  has been authorized for detection and/or diagnosis of SARS-CoV-2 by FDA under an Emergency Use Authorization (EUA). This EUA will remain  in effect (meaning this  test can be used) for the duration of the COVID-19 declaration under Section 56 4(b)(1) of the Act, 21 U.S.C. section 360bbb-3(b)(1), unless the authorization is terminated or revoked sooner. Performed at [facility], [place] [address], [place], [place] [address]      Labs: BNP (last 3 results) Recent Labs    02/27/19 1534  BNP [id number]*   Basic Metabolic Panel: Recent Labs  Lab 01/06/20 0337 01/07/20 0330 01/08/20 0348 01/09/20 0348 01/10/20 0432   NA 131* 132*  131* 131* 131* 131*  K 5.1 4.8  4.7 4.9 4.9 5.0  CL 104 104  104 102 101 102  CO2 21* 21*  20* 21* 22 20*  GLUCOSE 182* 120*  118* 226* 161* 165*  BUN 54* 50*  51* 52* 53* 56*  CREATININE 1.56* 1.19  1.18 1.26* 1.33* 1.69*  CALCIUM 8.7* 8.9  8.8* 8.9 8.7* 8.5*  MG  --   --  1.8  --   --   PHOS  --  2.6  --   --   --    Liver Function Tests: Recent Labs  Lab 01/07/20 0330  ALBUMIN 2.3*   No results for input(s): LIPASE, AMYLASE in the last 168 hours. No results for input(s): AMMONIA in the last 168 hours. CBC: Recent Labs  Lab 01/04/20 0418 01/06/20 0337 01/07/20 0330  WBC 9.1 9.7 10.1  NEUTROABS 8.3* 8.7* 9.0*  HGB 14.0 14.3 14.3  HCT 43.7 43.8 44.1  MCV 84.4 83.3 81.5  PLT 161 182 174   Cardiac Enzymes: No results for input(s): CKTOTAL, CKMB, CKMBINDEX, TROPONINI in the last 168 hours. BNP: Invalid input(s): POCBNP CBG: Recent Labs  Lab 01/09/20 1135 01/09/20 1514 01/09/20 2121 01/10/20 0628 01/10/20 1203  GLUCAP 110* 138* 198* 159* 111*   D-Dimer No results for input(s): DDIMER in the last 72 hours. Hgb A1c No results for input(s): HGBA1C in the last 72 hours. Lipid Profile No results for input(s): CHOL, HDL, LDLCALC, TRIG, CHOLHDL, LDLDIRECT in the last 72 hours. Thyroid function studies No results for input(s): TSH, T4TOTAL, T3FREE, THYROIDAB in the last 72 hours.  Invalid input(s): FREET3 Anemia work up No results for input(s): VITAMINB12, FOLATE, FERRITIN, TIBC, IRON, RETICCTPCT in the last 72 hours. Urinalysis    Component Value Date/Time   COLORURINE YELLOW 12/29/2019 2137   APPEARANCEUR CLEAR 12/29/2019 2137   LABSPEC >1.030 (H) 12/29/2019 2137   PHURINE 6.0 12/29/2019 2137   GLUCOSEU 250 (A) 12/29/2019 2137   HGBUR NEGATIVE 12/29/2019 2137   BILIRUBINUR NEGATIVE 12/29/2019 2137   [place] NEGATIVE 12/29/2019 2137   PROTEINUR 30 (A) 12/29/2019 2137   NITRITE NEGATIVE 12/29/2019 2137   LEUKOCYTESUR NEGATIVE  12/29/2019 2137   Sepsis Labs Invalid input(s): PROCALCITONIN,  WBC,  LACTICIDVEN Microbiology Recent Results (from the past 240 hour(s))  Surgical PCR screen     Status: None   Collection Time: 01/01/20 10:25 PM   Specimen: Nasal Mucosa; Nasal Swab  Result Value Ref Range Status   MRSA, PCR NEGATIVE NEGATIVE Final   Staphylococcus aureus NEGATIVE NEGATIVE Final    Comment: (NOTE) The Xpert SA Assay (FDA approved for NASAL specimens in patients 15 years of age and older), is one component of a comprehensive surveillance program. It is not intended to diagnose infection nor to guide or monitor treatment. Performed at [facility], [place] [address] [place], [place] [address]   SARS CORONAVIRUS 2 (TAT 6-24 HRS) Nasopharyngeal Nasopharyngeal Swab     Status: None  Collection Time: 01/07/20  2:24 PM   Specimen: Nasopharyngeal Swab  Result Value Ref Range Status   SARS Coronavirus 2 NEGATIVE NEGATIVE Final    Comment: (NOTE) SARS-CoV-2 target nucleic acids are NOT DETECTED. The SARS-CoV-2 RNA is generally detectable in upper and lower respiratory specimens during the acute phase of infection. Negative results do not preclude SARS-CoV-2 infection, do not rule out co-infections with other pathogens, and should not be used as the sole basis for treatment or other patient management decisions. Negative results must be combined with clinical observations, patient history, and epidemiological information. The expected result is Negative. Fact Sheet for Patients: SugarRoll.be Fact Sheet for Healthcare Providers: https://www.woods-mathews.com/ This test is not yet approved or cleared by the [place] FDA and  has been authorized for detection and/or diagnosis of SARS-CoV-2 by FDA under an Emergency Use Authorization (EUA). This EUA will remain  in effect (meaning this test can be used) for the duration of the COVID-19 declaration under  Section 56 4(b)(1) of the Act, 21 U.S.C. section 360bbb-3(b)(1), unless the authorization is terminated or revoked sooner. Performed at [facility], [place] [address], [place], [place] [address]      Time coordinating discharge: Over 30 minutes  SIGNED:   Shawna Clamp, MD  [facility] 01/10/2020, 12:09 PM Pager   If 7PM-7AM, please contact night-coverage [email or web address]

## 2020-01-10 NOTE — Progress Notes (Signed)
Patient ID: Ryan Rice, male   DOB: 1951/05/28, 69 y.o.   MRN: [id number] Patient arrived from 3W08 with RN and belongings. Patient oriented to room, nurse call system, fall prevention plan, rehab process, safety plan, rehab schedule, and health resource notebook with verbal understanding. Patient resting comfortably in bed with bed alarm on and call bell at side.

## 2020-01-10 NOTE — TOC Transition Note (Signed)
Transition of Care [facility]) - CM/SW Discharge Note   Patient Details  Name: Ryan Rice MRN: [id number] Date of Birth: 02/26/1951  Transition of Care [facility]) CM/SW Contact:  Pollie Friar, RN Phone Number: 01/10/2020, 11:50 AM   Clinical Narrative:    Pt discharging to CIR today. CM will update Cory with [facility]. CM signing off.    Final next level of care: IP Rehab Facility Barriers to Discharge: No Barriers Identified   Patient Goals and CMS Choice   CMS Medicare.gov Compare Post Acute Care list provided to:: Patient Choice offered to / list presented to : Patient  Discharge Placement                       Discharge Plan and Services   Discharge Planning Services: CM Consult Post Acute Care Choice: Home Health                    HH Arranged: PT, OT [facility] Agency: [place] Date [place]: 12/31/19   Representative spoke with at [facility]: Tommi Rumps aware of d/c home today.  Social Determinants of Health (SDOH) Interventions     Readmission Risk Interventions Readmission Risk Prevention Plan 12/31/2019  Transportation Screening Complete  HRI or Home Care Consult Complete  Palliative Care Screening Not Applicable  Medication Review (RN Care Manager) Referral to Pharmacy

## 2020-01-10 NOTE — Progress Notes (Signed)
Patient transferred to 4 MW/15

## 2020-01-11 ENCOUNTER — Inpatient Hospital Stay (HOSPITAL_COMMUNITY): Payer: Medicare HMO

## 2020-01-11 ENCOUNTER — Inpatient Hospital Stay (HOSPITAL_COMMUNITY): Payer: Medicare HMO | Admitting: Speech Pathology

## 2020-01-11 ENCOUNTER — Inpatient Hospital Stay (HOSPITAL_COMMUNITY): Payer: Medicare HMO | Admitting: Occupational Therapy

## 2020-01-11 DIAGNOSIS — I749 Embolism and thrombosis of unspecified artery: Secondary | ICD-10-CM | POA: Diagnosis not present

## 2020-01-11 LAB — CBC WITH DIFFERENTIAL/PLATELET
Abs Immature Granulocytes: 2.08 10*3/uL — ABNORMAL HIGH (ref 0.00–0.07)
Basophils Absolute: 0 10*3/uL (ref 0.0–0.1)
Basophils Relative: 0 %
Eosinophils Absolute: 0 10*3/uL (ref 0.0–0.5)
Eosinophils Relative: 0 %
HCT: 39 % (ref 39.0–52.0)
Hemoglobin: 12.8 g/dL — ABNORMAL LOW (ref 13.0–17.0)
Immature Granulocytes: 8 %
Lymphocytes Relative: 0 %
Lymphs Abs: 0.1 10*3/uL — ABNORMAL LOW (ref 0.7–4.0)
MCH: 27 pg (ref 26.0–34.0)
MCHC: 32.8 g/dL (ref 30.0–36.0)
MCV: 82.3 fL (ref 80.0–100.0)
Monocytes Absolute: 0.7 10*3/uL (ref 0.1–1.0)
Monocytes Relative: 3 %
Neutro Abs: 23.4 10*3/uL — ABNORMAL HIGH (ref 1.7–7.7)
Neutrophils Relative %: 89 %
Platelets: 117 10*3/uL — ABNORMAL LOW (ref 150–400)
RBC: 4.74 MIL/uL (ref 4.22–5.81)
RDW: 15.3 % (ref 11.5–15.5)
WBC: 26.4 10*3/uL — ABNORMAL HIGH (ref 4.0–10.5)
nRBC: 0 % (ref 0.0–0.2)

## 2020-01-11 LAB — GLUCOSE, CAPILLARY
Glucose-Capillary: 69 mg/dL — ABNORMAL LOW (ref 70–99)
Glucose-Capillary: 83 mg/dL (ref 70–99)
Glucose-Capillary: 151 mg/dL — ABNORMAL HIGH (ref 70–99)
Glucose-Capillary: 173 mg/dL — ABNORMAL HIGH (ref 70–99)
Glucose-Capillary: 210 mg/dL — ABNORMAL HIGH (ref 70–99)

## 2020-01-11 LAB — COMPREHENSIVE METABOLIC PANEL
ALT: 59 U/L — ABNORMAL HIGH (ref 0–44)
AST: 24 U/L (ref 15–41)
Albumin: 1.9 g/dL — ABNORMAL LOW (ref 3.5–5.0)
Alkaline Phosphatase: 82 U/L (ref 38–126)
Anion gap: 8 (ref 5–15)
BUN: 43 mg/dL — ABNORMAL HIGH (ref 8–23)
CO2: 19 mmol/L — ABNORMAL LOW (ref 22–32)
Calcium: 7.7 mg/dL — ABNORMAL LOW (ref 8.9–10.3)
Chloride: 107 mmol/L (ref 98–111)
Creatinine, Ser: 1.8 mg/dL — ABNORMAL HIGH (ref 0.61–1.24)
GFR calc Af Amer: 44 mL/min — ABNORMAL LOW (ref >60)
GFR calc non Af Amer: 38 mL/min — ABNORMAL LOW (ref >60)
Glucose, Bld: 106 mg/dL — ABNORMAL HIGH (ref 70–99)
Potassium: 4.4 mmol/L (ref 3.5–5.1)
Sodium: 134 mmol/L — ABNORMAL LOW (ref 135–145)
Total Bilirubin: 0.9 mg/dL (ref 0.3–1.2)
Total Protein: 4.4 g/dL — ABNORMAL LOW (ref 6.5–8.1)

## 2020-01-11 NOTE — Evaluation (Signed)
Physical Therapy Assessment and Plan  Patient Details  Name: Ryan Rice MRN: [id number] Date of Birth: [birth year]  PT Diagnosis: Difficulty walking, Impaired cognition and Muscle weakness Rehab Potential: Good ELOS: 12-14 days   Today's Date: 01/11/2020 PT Individual Time: 1000-1055 PT Individual Time Calculation (min): 55 min    Problem List:  Patient Active Problem List   Diagnosis Date Noted  . Ischemic stroke of frontal lobe ([place]) 01/10/2020  . Embolic infarction ([place]) 01/10/2020  . Controlled type 2 diabetes mellitus with hyperglycemia, without long-term current use of insulin ([place])   . Renal transplant, status post   . Essential hypertension   . Cerebral embolism with cerebral infarction 01/03/2020  . Brain tumor ([place]) 12/30/2019  . Hip pain, bilateral 12/30/2019  . Elevated PSA 12/30/2019  . Atrial fibrillation, chronic ([place]) 12/30/2019  . Hypomagnesemia 03/20/2019  . Diabetes ([place]) 03/15/2019  . Glaucoma 03/15/2019  . Scleral hemorrhage of left eye 03/15/2019  . Gout flare 03/15/2019  . Debility 03/08/2019  . AKI (acute kidney injury) ([place])   . CAP (community acquired pneumonia)   . Kidney transplant recipient   . Sepsis ([place]) 02/27/2019    Past Medical History:  Past Medical History:  Diagnosis Date  . Allergic rhinitis   . BPH (benign prostatic hyperplasia)   . Chronic atrial fibrillation ([place])   . Diabetes mellitus ([place])   . DVT (deep venous thrombosis) ([facility])    LLE  . Hematochezia   . HTN (hypertension)   . Hypomagnesemia 03/20/2019  . Kidney transplant recipient   . PVD (peripheral vascular disease) ([place])    Past Surgical History:  Past Surgical History:  Procedure Laterality Date  . KIDNEY TRANSPLANT      Assessment & Plan Clinical Impression: Patient is a 69 y.o.malewith history of PVD, renal transplant, CAF, BPH, HTN, who was admitted on 12/30/19 with staring episodes, decrease in verbal output and stuttering. Patient on chronic coumadin  and had been off coumadin in anticipation of GU procedure. 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 EEG without seizure activity and Dr.Cabbell consulted with plans for biopsy on 04/08. for input on biopsy. Follow up MRI brain on 04/08 showed stable anterior left frontal lobe with hyperintense flare suggesting blood products and new confluent areas of diffusion abnormality left hemisphere gyriform with small areas of chronic encephalomalacia question subacute infarct with petechial hemorrhage or venous infarcts. Surgery cancelled and [name] consulted for input. He felt that patient with L-ACA infarcts with hemorrhagic conversion as well as L-MCA infarcts--embolic due to A fib and lack of coumadin. He did not feel that brain biopsy was indicated.   Follow up CT head showed stable evolving L-ACA and left temporoparietal and occipital infarcts. MRV brain was negative for venous sinus thrombosis with hypoplastic left transverse sinus. 2D echo showed EF 55-60% and bilateral atrial dilatation. Carotid dopplers without significant ICA stenosis. Nephrology consulted for acute on chronic renal failure with hyperkalemia and he was started on IVF for hydration and renal ultrasound ordered for work up. Therapy ongoing revealing expressive and receptive language deficits with verbal perseveration and phonemic paraphasias, left visual field deficits and antalgic gait with poor safety awareness. CIR recommended due to functional deficits.Patient transferred to CIR on 01/10/2020 .   Patient currently requires min to mod  with mobility secondary to muscle weakness and muscle joint tightness, decreased cardiorespiratoy endurance, impaired timing and sequencing, motor apraxia, decreased coordination and decreased motor planning,  inattention, decreased attention, decreased awareness, decreased problem solving and decreased safety  awareness and decreased sitting balance, decreased standing balance, decreased postural control and decreased balance strategies.  Prior to hospitalization, patient was independent  with mobility and lived with Spouse, Family in a House home.  Home access is 1Stairs to enter.  Patient will benefit from skilled PT intervention to maximize safe functional mobility, minimize fall risk and decrease caregiver burden for planned discharge home with 24 hour supervision.  Anticipate patient will benefit from follow up [place] at discharge.  PT - End of Session Activity Tolerance: Decreased this session Endurance Deficit: Yes PT Assessment Rehab Potential (ACUTE/IP ONLY): Good PT Barriers to Discharge: Incontinence;Decreased caregiver support(unclear what support is available at home) PT Patient demonstrates impairments in the following area(s): Balance;Endurance;Motor;Safety;Perception PT Transfers Functional Problem(s): Bed Mobility;Bed to Chair;Car;Furniture PT Locomotion Functional Problem(s): Ambulation;Wheelchair Mobility;Stairs PT Plan PT Intensity: Minimum of 1-2 x/day ,45 to 90 minutes PT Frequency: 5 out of 7 days PT Duration Estimated Length of Stay: 12-14 days PT Treatment/Interventions: Ambulation/gait training;Balance/vestibular training;Cognitive remediation/compensation;Community reintegration;Discharge planning;Disease management/prevention;DME/adaptive equipment instruction;Functional mobility training;Neuromuscular re-education;Pain management;Patient/family education;Psychosocial support;Skin care/wound management;Splinting/orthotics;Stair training;Therapeutic Activities;UE/LE Strength taining/ROM;Therapeutic Exercise;UE/LE Coordination activities;Visual/perceptual remediation/compensation;Wheelchair propulsion/positioning;Functional electrical stimulation PT Transfers Anticipated Outcome(s): supervision  PT Locomotion Anticipated Outcome(s): supervision household gait distances and  stairs PT Recommendation Recommendations for Other Services: Neuropsych consult;Therapeutic Recreation consult Therapeutic Recreation Interventions: Outing/community reintergration Follow Up Recommendations: Home health PT;24 hour supervision/assistance Patient destination: Home Equipment Recommended: To be determined  Skilled Therapeutic Intervention Evaluation completed (see details above and below) with education on PT POC and goals and individual treatment initiated with focus on assessing functional transfers with and without AD, initiation of gait, bed mobility, and functional balance during toileting. Pt performs basic transfers with overall min assist with cues for technique and sequencing. W/c propulsion with supervision to CGA needed for attention to hand placement and difficulty coordinating initially x 150' total. Simulated car transfer with overall min assist for stand pivot without AD and cues for safer technique. Gait without AD requiring +2 assist for Bilateral HHA x 10' with noted flexed posture, decreased step length and foot clearance, and knee flexion. Attempted to use RW to side step around mat for functional home mobility assessment but difficulty with coordinating of RW and positioning. Would like to attempt in less distracting and crowded environment but did not have time to reassess due to urgent toileting needs during session. Pt able to perform bed mobility on flat surface with CGA progressing to supervision with repetition. Pt with incontinent bowel while performing bed mobility in gym and returned to room via w/c total assist. +2 for toileting for time efficiency in order to finish other mobility goals of evaluation. Pt performed toilet transfers and sit to stands with min assist as well as dynamic standing balance with min to mod assist without UE support (loose, watery stool in brief and then again on toilet). Multiple rest breaks needed during standing due to reported  fatigue. Stair negotiation training introduced (pt inconsistent with report but per chart has only 1 step to enter home) and performed 4 steps with up to mod assist during descent due to impaired eccentric control and difficulty with coordination of foot placement at times on the step. End of session tranfserred back to bed with min assist and returned to supine with supervision.   Pt with inconsistent yes/no answers during session and difficulty with expressing needs. Apraxia also limiting during functional mobility and inattention vs perceptual deficits noted as  well. Will need further assessment.   PT Evaluation Precautions/Restrictions Precautions Precautions: Fall Precaution Comments: language deficits; apraxia Restrictions Weight Bearing Restrictions: No Pain Pain Assessment Pain Scale: 0-10 Pain Score: 0-No pain Home Living/Prior Functioning Home Living Available Help at Discharge: Family;Available 24 hours/day Type of Home: House Home Access: Stairs to enter [employer] of Steps: 1 Entrance Stairs-Rails: None Home Layout: One level Additional Comments: info from chart - no fam present and pt inconsistent  Lives With: Spouse;Family Prior Function Level of Independence: Independent with gait;Independent with basic ADLs;Independent with transfers(per chart)  Able to Take Stairs?: Yes Vision/Perception  Perception Perception: Impaired Praxis Praxis: Impaired  Cognition Overall Cognitive Status: Impaired/Different from baseline Sensation Sensation Light Touch: Appears Intact Coordination Gross Motor Movements are Fluid and Coordinated: No Motor  Motor Motor: Abnormal postural alignment and control;Motor apraxia  Mobility Bed Mobility Bed Mobility: Rolling Left;Supine to Sit;Sit to Supine Rolling Left: Contact Guard/Touching assist Supine to Sit: Contact Guard/Touching assist Sit to Supine: Contact Guard/Touching assist Transfers Transfers: Sit to  Stand;Stand to Sit;Stand Pivot Transfers Sit to Stand: Minimal Assistance - Patient > 75% Stand to Sit: Minimal Assistance - Patient > 75% Stand Pivot Transfers: Minimal Assistance - Patient > 75% Transfer (Assistive device): None Locomotion  Gait Gait Distance (Feet): 10 Feet Assistive device: (hand held assist) Stairs / Additional Locomotion Stairs: Yes Stairs Assistance: Moderate Assistance - Patient 50 - 74% Stair Management Technique: Step to pattern;Two rails Number of Stairs: 4 Height of Stairs: 6 Wheelchair Mobility Wheelchair Mobility: Yes Wheelchair Assistance: Development worker, international aid: Both upper extremities Wheelchair Parts Management: Needs assistance Distance: 150  Trunk/Postural Assessment  Cervical Assessment Cervical Assessment: Within Functional Limits Thoracic Assessment Thoracic Assessment: (flexed posture) Lumbar Assessment Lumbar Assessment: (posterior tilt) Postural Control Postural Control: Deficits on evaluation(poor postural control in standing without UE support) Righting Reactions: delayed  Balance Balance Balance Assessed: Yes Static Sitting Balance Static Sitting - Level of Assistance: 5: Stand by assistance Dynamic Sitting Balance Dynamic Sitting - Level of Assistance: 5: Stand by assistance Static Standing Balance Static Standing - Level of Assistance: 4: Min assist Dynamic Standing Balance Dynamic Standing - Level of Assistance: 3: Mod assist Extremity Assessment   see OT eval for UE details.    RLE Assessment RLE Assessment: Exceptions to [facility] General Strength Comments: grossly appears 4/5; difficulty with formal testing due to apraxia LLE Assessment LLE Assessment: Exceptions to [facility] General Strength Comments: grossly appears 4/5; difficulty with formal testing due to aprazia    Refer to Care Plan for Long Term Goals  Recommendations for other services: Neuropsych and Therapeutic Recreation   Outing/community reintegration  Discharge Criteria: Patient will be discharged from PT if patient refuses treatment 3 consecutive times without medical reason, if treatment goals not met, if there is a change in medical status, if patient makes no progress towards goals or if patient is discharged from hospital.  The above assessment, treatment plan, treatment alternatives and goals were discussed and mutually agreed upon: by patient  Juanna Cao, PT, DPT, CBIS  01/11/2020, 1:00 PM

## 2020-01-11 NOTE — Evaluation (Signed)
Speech Language Pathology Assessment and Plan  Patient Details  Name: Ryan Rice MRN: [id number] Date of Birth: 02/12/1951  SLP Diagnosis: Aphasia;Cognitive Impairments  Rehab Potential: Good ELOS: 2 to 2.5 weeks    Today's Date: 01/11/2020 SLP Individual Time: 3154-0086 SLP Individual Time Calculation (min): 20 min   Problem List:  Patient Active Problem List   Diagnosis Date Noted  . Ischemic stroke of frontal lobe ([place]) 01/10/2020  . Embolic infarction ([place]) 01/10/2020  . Controlled type 2 diabetes mellitus with hyperglycemia, without long-term current use of insulin ([place])   . Renal transplant, status post   . Essential hypertension   . Cerebral embolism with cerebral infarction 01/03/2020  . Brain tumor ([place]) 12/30/2019  . Hip pain, bilateral 12/30/2019  . Elevated PSA 12/30/2019  . Atrial fibrillation, chronic ([place]) 12/30/2019  . Hypomagnesemia 03/20/2019  . Diabetes ([place]) 03/15/2019  . Glaucoma 03/15/2019  . Scleral hemorrhage of left eye 03/15/2019  . Gout flare 03/15/2019  . Debility 03/08/2019  . AKI (acute kidney injury) ([place])   . CAP (community acquired pneumonia)   . Kidney transplant recipient   . Sepsis ([place]) 02/27/2019   Past Medical History:  Past Medical History:  Diagnosis Date  . Allergic rhinitis   . BPH (benign prostatic hyperplasia)   . Chronic atrial fibrillation ([place])   . Diabetes mellitus ([place])   . DVT (deep venous thrombosis) ([facility])    LLE  . Hematochezia   . HTN (hypertension)   . Hypomagnesemia 03/20/2019  . Kidney transplant recipient   . PVD (peripheral vascular disease) ([place])    Past Surgical History:  Past Surgical History:  Procedure Laterality Date  . KIDNEY TRANSPLANT      Assessment / Plan / Recommendation Clinical Impression Ryan Rice is a 69 year old male with history of PVD, CAF, BPH, HTN, renal transplant who was admitted on 12/30/2019 with changes in mentation and speech.  He was noted to have staring episodes,  decrease in verbal output and stuttering.  History taken from chart review due to aphasia.  He was on chronic Coumadin which had been held in anticipation of GU procedure.  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  EEG negative for seizures.  He was started on steroids. Dr. Christella Noa was consulted with plans for biopsy on 04/08.  Follow-up MRI brain on 04/08 showed stable anterior left frontal lobe with hyperintense FLAIR suggesting blood products and new confluent area of diffusion abnormality left hemisphere gyriform small areas of chronic encephalomalacia question subacute infarct with petechial hemorrhages versus venous infarcts.  Surgery was canceled and neurology was consulted for input.  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 fib and lack of Coumadin.  He did not feel that biopsy was indicated. Follow-up CT head showed stable evolving left ACA and left temporoparietal and occipital lobe infarcts.  MRV brain was negative for venous sinus thrombosis with hypoplastic left transverse sinus.  Echocardiogram with ejection fraction of 55-60 -60%.  Bilateral atrial dilatation.  Carotid Doppler showed no significant ICA stenosis.  Dr. Leonie Man recommended starting Eliquis on 4/10. He did have worsening of cognitive status on 04/12 with worsening of expressive and receptive aphasia with difficulty following commands and perseverative speech. Follow up CT head showed no change in lesion left frontal lobe and evolving infarcts left temporoparietal and occipital lobes.  Nephrology was consulted for acute on chronic renal failure with hyperkalemia and he was started on IV fluids for hydration.  Renal ultrasound done revealing stable appearance of transplanted kidney without hydronephrosis. Nephrology has signed off as Hyperkalemia has corrected with  IVF.  Steroids being tapered to home dose. Patient continues to be limited by expressive/receptive aphasia,  balance deficits and difficulty with sequencing. CIR recommended due to functional decline.  Please see preadmission assessment from earlier today as well.   Pt presents with expressive > receptive language deficits. Pt's expressive language contains anomia, phonemic paraphasias and semantic paraphasias. Pt produces automatic phrases such as greetings without any word finding deficits. However his ability to answer questions is impacted even when answering personal questions. He is not able to label objects but he is able to imitate most verbal stimuli. Additionally, pt has deficits in following 1 step directions and answering yes/no questions. Additionally, pt demonstrated deficits in problem solving with additional cognitive testing indicated as language abilities progress. At this time, skilled ST is required to target the above mentioned goals, increase pt's functional independence and reduce caregiver burden.      Skilled Therapeutic Interventions          Speech-Language evaluation completed.   SLP Assessment  Patient will need skilled [place] [facility] Services during CIR admission    Recommendations  Recommendations for Other Services: Neuropsych consult Patient destination: Home Follow up Recommendations: Other (comment)(TBD) Equipment Recommended: None recommended by SLP    SLP Frequency 3 to 5 out of 7 days   SLP Duration  SLP Intensity  SLP Treatment/Interventions 2 to 2.5 weeks  Minumum of 1-2 x/day, 30 to 90 minutes  Speech/Language facilitation;Cognitive remediation/compensation    Pain Pain Assessment Pain Scale: 0-10 Pain Score: 0-No pain  Prior Functioning Cognitive/Linguistic Baseline: Baseline deficits Baseline deficit details: language deficts  Type of Home: House  Lives With: Spouse;Family Available Help at Discharge: Family;Available 24  hours/day  SLP Evaluation Cognition Overall Cognitive Status: Impaired/Different from baseline Arousal/Alertness: Awake/alert Orientation Level: Oriented to person Attention: Selective Selective Attention: Impaired Selective Attention Impairment: Verbal basic;Functional basic Awareness: Appears intact Problem Solving: Impaired Problem Solving Impairment: Verbal complex;Verbal basic Behaviors: Perseveration Safety/Judgment: Impaired  Comprehension Auditory Comprehension Overall Auditory Comprehension: Impaired Yes/No Questions: Impaired Basic Biographical Questions: 0-25% accurate Basic Immediate Environment Questions: 0-24% accurate Commands: Impaired One Step Basic Commands: 75-100% accurate Two Step Basic Commands: 25-49% accurate Conversation: Simple Other Conversation Comments: Pt had difficulty with receptive language particularly in conversational speech EffectiveTechniques: Repetition Environmental consultant Discrimination: Not tested Reading Comprehension Reading Status: Not tested Expression Expression Primary Mode of Expression: Verbal Verbal Expression Overall Verbal Expression: Impaired Initiation: No impairment Automatic Speech: Name Level of Generative/Spontaneous Verbalization: Word Repetition: Impaired Level of Impairment: Phrase level Naming: Impairment Responsive: 26-50% accurate Confrontation: Impaired Convergent: 25-49% accurate Divergent: 25-49% accurate Verbal Errors: Semantic paraphasias;Phonemic paraphasias Pragmatics: No impairment Non-Verbal Means of Communication: Not applicable Written Expression Dominant Hand: Right Written Expression: Not tested Oral Motor Oral Motor/Sensory Function Overall Oral Motor/Sensory Function: Within functional limits Motor Speech Overall Motor Speech: Appears within functional limits for tasks assessed Respiration: Within functional limits Phonation: Normal Resonance: Within functional  limits Articulation: Within functional limitis Level of Impairment: Sentence Intelligibility: Intelligible Motor Planning: Witnin functional limits Motor Speech Errors: Not applicable    Short Term Goals: Week 1: SLP Short Term Goal 1 (Week 1): Pt will name common objects in 8 out of 10 opportunities with Min A cues. SLP Short Term Goal 2 (Week  1): Pt will answer basic yes/no questions in 8 out of 10 opportunities with Min A cues. SLP Short Term Goal 3 (Week 1): Pt will use multimodal means to communicate basic wants and needs with Mod A cues. SLP Short Term Goal 4 (Week 1): Pt will follow 1 step-directions in 8 out of 10 opportunities with Min A cues. SLP Short Term Goal 5 (Week 1): Pt will complete basic problem solving tasks with Min A cues.  Refer to Care Plan for Long Term Goals  Recommendations for other services: Neuropsych  Discharge Criteria: Patient will be discharged from SLP if patient refuses treatment 3 consecutive times without medical reason, if treatment goals not met, if there is a change in medical status, if patient makes no progress towards goals or if patient is discharged from hospital.  The above assessment, treatment plan, treatment alternatives and goals were discussed and mutually agreed upon: No family available/patient unable  Benett Swoyer 01/11/2020, 2:55 PM

## 2020-01-11 NOTE — Progress Notes (Signed)
Ryan Rice PHYSICAL MEDICINE & REHABILITATION PROGRESS NOTE   Subjective/Complaints:  Pt denies pain; said "no issues".  Bowels "did move". Pt had a low grade temp of 99.5 overnight, however no Sx's of being ill, per pt and nursing.  Pt has WBC of 26k- which is new from 4 days ago, however has been started on Prednisone 60 mg since then. Steroids likely the cause, but will check U/A and Cx and CXR    ROS:  Pt denies SOB, abd pain, CP, N/V/C/D, and vision changes   Objective:   No results found. Recent Labs    01/11/20 0802  WBC 26.4*  HGB 12.8*  HCT 39.0  PLT 117*   Recent Labs    01/10/20 0432 01/11/20 0802  NA 131* 134*  K 5.0 4.4  CL 102 107  CO2 20* 19*  GLUCOSE 165* 106*  BUN 56* 43*  CREATININE 1.69* 1.80*  CALCIUM 8.5* 7.7*    Intake/Output Summary (Last 24 hours) at 01/11/2020 1045 Last data filed at 01/11/2020 0900 Gross per 24 hour  Intake 2281.37 ml  Output --  Net 2281.37 ml     Physical Exam: Vital Signs Blood pressure 133/61, pulse 83, temperature 99 F (37.2 C), temperature source Oral, resp. rate 18, height 5\' 11"  (1.803 m), weight 85.6 kg, SpO2 97 %.  Physical Exam  Nursing note and vitals reviewed. Constitutional: sitting up in bed watching TV, appropriate, NAD HENT: conjugate gaze; oral mucosa moist CV; RRR-  no JVD.  Respiratory: CTA B/L- good air movement.  GI: soft, NT, ND, (+)BS Musculoskeletal:     Comments: No edema or tenderness in extremities  Neurological: He is alert.  Severe expressive aphasia- able to answer yes/no to many questions, but not much more.  Motor: Right upper extremity: 5/5 proximal distal Right lower extremity: 4+/5 proximal distally  Left upper extremity: 4/5 proximal distally left lower extremity: Hip flexion knee extension 4/5, ankle dorsiflexion 2-/5  Skin: Skin is warm and dry.  Psychiatric:  Calm       Assessment/Plan: 1. Functional deficits secondary to L ACA, MCA embolic infracts which  require 3+ hours per day of interdisciplinary therapy in a comprehensive inpatient rehab setting.  Physiatrist is providing close team supervision and 24 hour management of active medical problems listed below.  Physiatrist and rehab team continue to assess barriers to discharge/monitor patient progress toward functional and medical goals  Care Tool:  Bathing    Body parts bathed by patient: Right arm, Left arm, Chest, Abdomen, Right upper leg, Left upper leg, Right lower leg, Left lower leg, Face   Body parts bathed by helper: Front perineal area, Buttocks(due to large incontience)     Bathing assist Assist Level: Moderate Assistance - Patient 50 - 74%     Upper Body Dressing/Undressing Upper body dressing   What is the patient wearing?: Pull over shirt    Upper body assist Assist Level: Supervision/Verbal cueing    Lower Body Dressing/Undressing Lower body dressing      What is the patient wearing?: Pants, Underwear/pull up     Lower body assist Assist for lower body dressing: Moderate Assistance - Patient 50 - 74%     Toileting Toileting    Toileting assist Assist for toileting: (incontience requring total A)     Transfers Chair/bed transfer  Transfers assist     Chair/bed transfer assist level: Minimal Assistance - Patient > 75%     Locomotion Ambulation   Ambulation assist  Walk 10 feet activity   Assist           Walk 50 feet activity   Assist           Walk 150 feet activity   Assist           Walk 10 feet on uneven surface  activity   Assist           Wheelchair     Assist               Wheelchair 50 feet with 2 turns activity    Assist            Wheelchair 150 feet activity     Assist          Blood pressure 133/61, pulse 83, temperature 99 F (37.2 C), temperature source Oral, resp. rate 18, height 5\' 11"  (1.803 m), weight 85.6 kg, SpO2 97 %.  Medical Problem  List and Plan: 1.  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             -patient may shower             -ELOS/Goals: 12-17 days/supervision/min a             Admit to CIR 2.  Antithrombotics: -DVT/anticoagulation:  Pharmaceutical: Other (comment)--Eliquis             -antiplatelet therapy: N/A 3. Pain Management: N/A 4. Mood: LCSW to follow for evaluation and support when appropriate.              -antipsychotic agents: N/A 5. Neuropsych: This patient is not capable of making decisions on his own behalf. 6. Skin/Wound Care: Routine pressure relief measures.  7. Fluids/Electrolytes/Nutrition: Monitor I/O. Intake variable.  8.  HTN: Monitor tid--Labetalol was d/c due to pauses--now on metoprolol, hydralazine and Nifedipine   4/17- BP well controlled- not too low- con't meds             Monitor with increased mobility 9.  CKD s/p renal transplant: Continues to have upward trend BUN and creatinine despite IV fluids for hydration. On prednisone taper to 10 mg daily. Sepra DS MWF,Prograf and Myfortic.              CMP ordered.  4/17- Cr up to 1.80- and BUN 43- on steroid taper for this- since only slightly increased over last labs, will recheck in AM and call renal if goes up again.  10.  T2DM with hyperglycemia: Hgb A1c- 7.1.  Was on metformin, Starlix and Januvia PTA. Monitor blood sugars ac/hs. Continue Lantus 25 units daily and use SSI for elevated BS.    CBG (last 3)  Recent Labs    01/10/20 2109 01/11/20 0609 01/11/20 0634  GLUCAP 145* 69* 83    4/17- BGs slightly low to well controlled in spite of high dose prednisone             Monitor with increased mobility 11. Severe leukocytosis  4/17- WBC 26- up from 10k- likely due to high dose prednisone, however will check U/A and Cx and CXR and order labs for AM, including lactic acid, to make sure we monitor nd treat as needed.     LOS: 1 days A FACE TO FACE EVALUATION WAS  PERFORMED  Blaire Hodsdon 01/11/2020, 10:45 AM

## 2020-01-11 NOTE — Evaluation (Signed)
Occupational Therapy Assessment and Plan  Patient Details  Name: Ryan Rice MRN: [id number] Date of Birth: 1950-11-04  OT Diagnosis: apraxia and muscle weakness (generalized) Rehab Potential: Rehab Potential (ACUTE ONLY): Good ELOS: ~2 weeks   Today's Date: 01/11/2020 OT Individual Time: 8366-2947 OT Individual Time Calculation (min): 75 min     Problem List:  Patient Active Problem List   Diagnosis Date Noted  . Ischemic stroke of frontal lobe ([place]) 01/10/2020  . Embolic infarction ([place]) 01/10/2020  . Controlled type 2 diabetes mellitus with hyperglycemia, without long-term current use of insulin ([place])   . Renal transplant, status post   . Essential hypertension   . Cerebral embolism with cerebral infarction 01/03/2020  . Brain tumor ([place]) 12/30/2019  . Hip pain, bilateral 12/30/2019  . Elevated PSA 12/30/2019  . Atrial fibrillation, chronic ([place]) 12/30/2019  . Hypomagnesemia 03/20/2019  . Diabetes ([place]) 03/15/2019  . Glaucoma 03/15/2019  . Scleral hemorrhage of left eye 03/15/2019  . Gout flare 03/15/2019  . Debility 03/08/2019  . AKI (acute kidney injury) ([place])   . CAP (community acquired pneumonia)   . Kidney transplant recipient   . Sepsis ([place]) 02/27/2019    Past Medical History:  Past Medical History:  Diagnosis Date  . Allergic rhinitis   . BPH (benign prostatic hyperplasia)   . Chronic atrial fibrillation ([place])   . Diabetes mellitus ([place])   . DVT (deep venous thrombosis) ([facility])    LLE  . Hematochezia   . HTN (hypertension)   . Hypomagnesemia 03/20/2019  . Kidney transplant recipient   . PVD (peripheral vascular disease) ([place])    Past Surgical History:  Past Surgical History:  Procedure Laterality Date  . KIDNEY TRANSPLANT      Assessment & Plan Clinical Impression: Patient is a 69 y.o. year old male with history of PVD, CAF, BPH, HTN, renal transplant who was admitted on 12/30/2019 with changes in mentation and speech.  He was noted to have staring  episodes, decrease in verbal output and stuttering.  History taken from chart review due to aphasia.  He was on chronic Coumadin which had been held in anticipation of GU procedure.  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  EEG negative for seizures.  He was started on steroids. Dr. Christella Noa was consulted with plans for biopsy on 04/08.  Follow-up MRI brain on 04/08 showed stable anterior left frontal lobe with hyperintense FLAIR suggesting blood products and new confluent area of diffusion abnormality left hemisphere gyriform small areas of chronic encephalomalacia question subacute infarct with petechial hemorrhages versus venous infarcts.  Surgery was canceled and neurology was consulted for input.  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 fib and lack of Coumadin.  He did not feel that biopsy was indicated.  Follow-up CT head showed stable evolving left ACA and left temporoparietal and occipital lobe infarcts.  MRV brain was negative for venous sinus thrombosis with hypoplastic left transverse sinus.  Echocardiogram with ejection fraction of 55-60 -60%.  Bilateral atrial dilatation.  Carotid Doppler showed no significant ICA stenosis.  Dr. Leonie Man recommended starting Eliquis on 4/10. He did have worsening of cognitive status on 04/12 with worsening of expressive and receptive aphasia with difficulty following commands and perseverative speech. Follow up CT head showed no change in lesion left frontal lobe and evolving infarcts left temporoparietal and  occipital lobes.     Nephrology was consulted for acute on chronic renal failure with hyperkalemia and he was started on IV fluids for hydration.  Renal ultrasound done revealing stable appearance of transplanted kidney without hydronephrosis. Nephrology has signed off as Hyperkalemia has  corrected with IVF.  Steroids being tapered to home dose. Patient continues to be limited by expressive/receptive aphasia,  balance deficits and difficulty with sequencing.  Patient transferred to CIR on 01/10/2020 .    Patient currently requires min to mod  with basic self-care skills and funtional mobility  secondary to muscle weakness,, impaired timing and sequencing, motor apraxia and decreased coordination, decreased attention to right and ideational apraxia, decreased attention, decreased awareness, decreased problem solving, decreased safety awareness, delayed processing and language deflicits and decreased standing balance, decreased postural control and decreased balance strategies.  Prior to hospitalization, patient could complete ADL with independent .  Patient will benefit from skilled intervention to decrease level of assist with basic self-care skills and increase independence with basic self-care skills prior to discharge home with care partner.  Anticipate patient will require 24 hour supervision and follow up home health.  OT - End of Session Activity Tolerance: Tolerates 30+ min activity without fatigue OT Assessment Rehab Potential (ACUTE ONLY): Good OT Patient demonstrates impairments in the following area(s): Balance;Cognition;Motor;Perception;Safety;Skin Integrity OT Basic ADL's Functional Problem(s): Grooming;Bathing;Dressing;Toileting;Eating OT Transfers Functional Problem(s): Toilet;Tub/Shower OT Additional Impairment(s): None OT Plan OT Intensity: Minimum of 1-2 x/day, 45 to 90 minutes OT Frequency: 5 out of 7 days OT Duration/Estimated Length of Stay: ~2 weeks OT Treatment/Interventions: Balance/vestibular training;Neuromuscular re-education;Self Care/advanced ADL retraining;Therapeutic Exercise;Cognitive remediation/compensation;DME/adaptive equipment instruction;Pain management;Skin care/wound managment;UE/LE Strength taining/ROM;Community reintegration;Patient/family  education;UE/LE Coordination activities;Discharge planning;Functional mobility training;Psychosocial support;Therapeutic Activities OT Self Feeding Anticipated Outcome(s): supervision OT Basic Self-Care Anticipated Outcome(s): supervision OT Toileting Anticipated Outcome(s): supervision OT Bathroom Transfers Anticipated Outcome(s): supervision OT Recommendation Patient destination: Home Follow Up Recommendations: Home health OT;Outpatient OT Equipment Recommended: To be determined   Skilled Therapeutic Intervention Ot eval initiated with OT goals, purpose and role discussed.   Pt had been incontinent of liquid bowel in the bed and unsure if the pt was really aware.  Pt's IV site was also bleeding as well as the skin tears on his scrotum. RN aware of all issues.   Self care retraining at sink level due to IV running in session. Pt able to perform transfers with min A as well as sit to stands. Pt did demonstrate ideational apraxia in session: pt was able to don shirt without difficulty but then proceeded to thread his pants on his ARms and put them over his head without noticiing the errors. Pt performed this twice and required max multimodal cues to correct and thread them on the proper body parts. Pt able to thread and don socks and shoes (inlcuding tying them). Pt with decr standign tolerance in session requiring multiple sit to stands during peri hygiene after accident.  Pt ate breakfast with setup and no apraxia note. Did note a right inattention at times- will continue to assess. Pt also presents with expressive difficulties with no recognition for errors in his answers. Left sitting up in the w/c.   OT Evaluation Precautions/Restrictions  Precautions Precautions: Fall Precaution Comments: language deficits Restrictions Weight Bearing Restrictions: No General Chart Reviewed: Yes Family/Caregiver Present: No   Pain Pain Assessment Pain Scale: 0-10 Pain Score: 0-No pain Home  Living/Prior Functioning Home Living Family/patient expects to be discharged to:: Private residence Living Arrangements: Spouse/significant other Available  Help at Discharge: Family, Available 24 hours/day Type of Home: House Home Access: Stairs to enter [employer] of Steps: 1 Bathroom Shower/Tub: Chiropodist: Standard  Lives With: Spouse, Family ADL ADL Where Assessed-Eating: Chair Grooming: Minimal assistance Upper Body Bathing: Setup Where Assessed-Upper Body Bathing: Sitting at sink Lower Body Bathing: Moderate assistance Where Assessed-Lower Body Bathing: Wheelchair, Sitting at sink Upper Body Dressing: Supervision/safety Lower Body Dressing: Moderate assistance Where Assessed-Lower Body Dressing: Sitting at sink Toileting: (total A due to incontience) Toilet Transfer: Minimal assistance Toilet Transfer Method: Stand pivot Vision Baseline Vision/History: Wears glasses Wears Glasses: At all times Patient Visual Report: No change from baseline Perception  Perception: Impaired Inattention/Neglect: Does not attend to right visual field Praxis Praxis: Impaired Praxis Impairment Details: Initiation;Ideomotor Cognition Overall Cognitive Status: Impaired/Different from baseline Arousal/Alertness: Awake/alert Orientation Level: Place;Situation;Person Person: Oriented Place: Oriented Situation: Oriented Year: 2021 Month: March Day of Week: Correct Memory: Impaired Memory Impairment: Decreased short term memory Immediate Memory Recall: (unable due to language deficits) Attention: Sustained;Selective Sustained Attention: Appears intact Selective Attention: Impaired Awareness: Appears intact Awareness Impairment: Intellectual impairment Problem Solving: Impaired Executive Function: (all impaired due to lower level of cognition) Behaviors: Perseveration Safety/Judgment: Impaired Sensation Sensation Light Touch: Appears  Intact Hot/Cold: Appears Intact Proprioception: Appears Intact Coordination Gross Motor Movements are Fluid and Coordinated: Yes Fine Motor Movements are Fluid and Coordinated: Yes Finger Nose Finger Test: does demonstrate apraxia with tasks Motor  Motor Motor: Motor apraxia Mobility  Bed Mobility Bed Mobility: Supine to Sit Supine to Sit: Minimal Assistance - Patient > 75%  Trunk/Postural Assessment  Cervical Assessment Cervical Assessment: Within Functional Limits Thoracic Assessment Thoracic Assessment: Within Functional Limits(forward lean in standing) Lumbar Assessment Lumbar Assessment: Within Functional Limits Postural Control Postural Control: Deficits on evaluation Righting Reactions: delayed  Balance Balance Balance Assessed: Yes Static Sitting Balance Static Sitting - Balance Support: No upper extremity supported Static Sitting - Level of Assistance: 5: Stand by assistance Dynamic Sitting Balance Dynamic Sitting - Balance Support: During functional activity Dynamic Sitting - Level of Assistance: 5: Stand by assistance Static Standing Balance Static Standing - Balance Support: During functional activity Static Standing - Level of Assistance: 4: Min assist Dynamic Standing Balance Dynamic Standing - Balance Support: During functional activity Dynamic Standing - Level of Assistance: 3: Mod assist Extremity/Trunk Assessment RUE Assessment General Strength Comments: 4/5 LUE Assessment General Strength Comments: 4/5     Refer to Care Plan for Long Term Goals  Recommendations for other services: None    Discharge Criteria: Patient will be discharged from OT if patient refuses treatment 3 consecutive times without medical reason, if treatment goals not met, if there is a change in medical status, if patient makes no progress towards goals or if patient is discharged from hospital.  The above assessment, treatment plan, treatment alternatives and goals were  discussed and mutually agreed upon: by patient  Nicoletta Ba 01/11/2020, 12:09 PM

## 2020-01-12 ENCOUNTER — Inpatient Hospital Stay (HOSPITAL_COMMUNITY): Payer: Medicare HMO

## 2020-01-12 DIAGNOSIS — I749 Embolism and thrombosis of unspecified artery: Secondary | ICD-10-CM | POA: Diagnosis not present

## 2020-01-12 LAB — BASIC METABOLIC PANEL
Anion gap: 9 (ref 5–15)
BUN: 37 mg/dL — ABNORMAL HIGH (ref 8–23)
CO2: 14 mmol/L — ABNORMAL LOW (ref 22–32)
Calcium: 7.9 mg/dL — ABNORMAL LOW (ref 8.9–10.3)
Chloride: 108 mmol/L (ref 98–111)
Creatinine, Ser: 1.52 mg/dL — ABNORMAL HIGH (ref 0.61–1.24)
GFR calc Af Amer: 53 mL/min — ABNORMAL LOW (ref >60)
GFR calc non Af Amer: 46 mL/min — ABNORMAL LOW (ref >60)
Glucose, Bld: 166 mg/dL — ABNORMAL HIGH (ref 70–99)
Potassium: 4.3 mmol/L (ref 3.5–5.1)
Sodium: 131 mmol/L — ABNORMAL LOW (ref 135–145)

## 2020-01-12 LAB — URINALYSIS, ROUTINE W REFLEX MICROSCOPIC
Bacteria, UA: NONE SEEN
Bilirubin Urine: NEGATIVE
Glucose, UA: 150 mg/dL — AB
Ketones, ur: NEGATIVE mg/dL
Nitrite: NEGATIVE
Protein, ur: 100 mg/dL — AB
Specific Gravity, Urine: 1.017 (ref 1.005–1.030)
pH: 5 (ref 5.0–8.0)

## 2020-01-12 LAB — CBC WITH DIFFERENTIAL/PLATELET
Abs Immature Granulocytes: 0.7 10*3/uL — ABNORMAL HIGH (ref 0.00–0.07)
Basophils Absolute: 0 10*3/uL (ref 0.0–0.1)
Basophils Relative: 0 %
Eosinophils Absolute: 0 10*3/uL (ref 0.0–0.5)
Eosinophils Relative: 0 %
HCT: 37.6 % — ABNORMAL LOW (ref 39.0–52.0)
Hemoglobin: 12.3 g/dL — ABNORMAL LOW (ref 13.0–17.0)
Immature Granulocytes: 3 %
Lymphocytes Relative: 0 %
Lymphs Abs: 0.1 10*3/uL — ABNORMAL LOW (ref 0.7–4.0)
MCH: 26.9 pg (ref 26.0–34.0)
MCHC: 32.7 g/dL (ref 30.0–36.0)
MCV: 82.3 fL (ref 80.0–100.0)
Monocytes Absolute: 0.8 10*3/uL (ref 0.1–1.0)
Monocytes Relative: 4 %
Neutro Abs: 19.9 10*3/uL — ABNORMAL HIGH (ref 1.7–7.7)
Neutrophils Relative %: 93 %
Platelets: 95 10*3/uL — ABNORMAL LOW (ref 150–400)
RBC: 4.57 MIL/uL (ref 4.22–5.81)
RDW: 15.3 % (ref 11.5–15.5)
WBC: 21.4 10*3/uL — ABNORMAL HIGH (ref 4.0–10.5)
nRBC: 0 % (ref 0.0–0.2)

## 2020-01-12 LAB — GLUCOSE, CAPILLARY
Glucose-Capillary: 131 mg/dL — ABNORMAL HIGH (ref 70–99)
Glucose-Capillary: 183 mg/dL — ABNORMAL HIGH (ref 70–99)
Glucose-Capillary: 239 mg/dL — ABNORMAL HIGH (ref 70–99)
Glucose-Capillary: 231 mg/dL — ABNORMAL HIGH (ref 70–99)

## 2020-01-12 LAB — LACTIC ACID, PLASMA: Lactic Acid, Venous: 1.1 mmol/L (ref 0.5–1.9)

## 2020-01-12 NOTE — Progress Notes (Signed)
[facility] PHYSICAL MEDICINE & REHABILITATION PROGRESS NOTE   Subjective/Complaints:  WBC down to 2.18- U/A not done CXR looks good- no acute disease Slept "Fine" bowels working OK per pt- denies pain.    ROS:   Pt denies SOB, abd pain, CP, N/V/C/D, and vision changes    Objective:   DG CHEST PORT 1 VIEW  Result Date: 01/11/2020 CLINICAL DATA:  Leukocytosis, CVA EXAM: PORTABLE CHEST 1 VIEW COMPARISON:  03/02/2019 chest radiograph. FINDINGS: Left rotated chest radiograph. Stable cardiomediastinal silhouette with moderate cardiomegaly. No pneumothorax. No pleural effusion. Cephalization of the pulmonary vasculature without overt pulmonary edema. No acute consolidative airspace disease. IMPRESSION: Stable moderate cardiomegaly without overt pulmonary edema. No active pulmonary disease. Electronically Signed   By: Ilona Sorrel M.D.   On: 01/11/2020 13:21   Recent Labs    01/11/20 0802 01/12/20 0505  WBC 26.4* 21.4*  HGB 12.8* 12.3*  HCT 39.0 37.6*  PLT 117* 95*   Recent Labs    01/11/20 0802 01/12/20 0505  NA 134* 131*  K 4.4 4.3  CL 107 108  CO2 19* 14*  GLUCOSE 106* 166*  BUN 43* 37*  CREATININE 1.80* 1.52*  CALCIUM 7.7* 7.9*    Intake/Output Summary (Last 24 hours) at 01/12/2020 1024 Last data filed at 01/12/2020 0849 Gross per 24 hour  Intake 602 ml  Output --  Net 602 ml     Physical Exam: Vital Signs Blood pressure (!) 144/69, pulse 80, temperature 99.1 F (37.3 C), temperature source Oral, resp. rate 18, height 5\' 11"  (1.803 m), weight 85.6 kg, SpO2 98 %.  Physical Exam  Nursing note and vitals reviewed. Constitutional: asleep but woke to verbal stimuli, NAD HENT: conjugate gaze; oral mucosa moist CV; No JVD.  Respiratory: no resp distress; no accessory muscle use GI: soft, nondistended Musculoskeletal:     Comments: No edema or tenderness in extremities  Neurological: He is alert.  Can respond yes/no to questions with expressive aphasia.  Motor:  Right upper extremity: 5/5 proximal distal Right lower extremity: 4+/5 proximal distally  Left upper extremity: 4/5 proximal distally left lower extremity: Hip flexion knee extension 4/5, ankle dorsiflexion 2-/5  Skin: Skin is warm and dry.  Psychiatric:  appropriate       Assessment/Plan: 1. Functional deficits secondary to L ACA, MCA embolic infracts which require 3+ hours per day of interdisciplinary therapy in a comprehensive inpatient rehab setting.  Physiatrist is providing close team supervision and 24 hour management of active medical problems listed below.  Physiatrist and rehab team continue to assess barriers to discharge/monitor patient progress toward functional and medical goals  Care Tool:  Bathing    Body parts bathed by patient: Right arm, Left arm, Chest, Abdomen, Right upper leg, Left upper leg, Right lower leg, Left lower leg, Face   Body parts bathed by helper: Front perineal area, Buttocks(due to large incontience)     Bathing assist Assist Level: Moderate Assistance - Patient 50 - 74%     Upper Body Dressing/Undressing Upper body dressing   What is the patient wearing?: Pull over shirt    Upper body assist Assist Level: Supervision/Verbal cueing    Lower Body Dressing/Undressing Lower body dressing      What is the patient wearing?: Pants, Underwear/pull up     Lower body assist Assist for lower body dressing: Moderate Assistance - Patient 50 - 74%     Toileting Toileting    Toileting assist Assist for toileting: Maximal Assistance - Patient 25 -  49%     Transfers Chair/bed transfer  Transfers assist     Chair/bed transfer assist level: Minimal Assistance - Patient > 75%     Locomotion Ambulation   Ambulation assist      Assist level: 2 helpers Assistive device: Hand held assist Max distance: 10'   Walk 10 feet activity   Assist     Assist level: 2 helpers Assistive device: Hand held assist   Walk 50 feet  activity   Assist Walk 50 feet with 2 turns activity did not occur: Safety/medical concerns         Walk 150 feet activity   Assist Walk 150 feet activity did not occur: Safety/medical concerns         Walk 10 feet on uneven surface  activity   Assist Walk 10 feet on uneven surfaces activity did not occur: Safety/medical concerns         Wheelchair     Assist   Type of Wheelchair: Manual    Wheelchair assist level: Contact Guard/Touching assist Max wheelchair distance: 150'    Wheelchair 50 feet with 2 turns activity    Assist        Assist Level: Contact Guard/Touching assist   Wheelchair 150 feet activity     Assist      Assist Level: Contact Guard/Touching assist   Blood pressure (!) 144/69, pulse 80, temperature 99.1 F (37.3 C), temperature source Oral, resp. rate 18, height 5\' 11"  (1.803 m), weight 85.6 kg, SpO2 98 %.  Medical Problem List and Plan: 1.  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             -patient may shower             -ELOS/Goals: 12-17 days/supervision/min a             Admit to CIR 2.  Antithrombotics: -DVT/anticoagulation:  Pharmaceutical: Other (comment)--Eliquis             -antiplatelet therapy: N/A 3. Pain Management: N/A 4. Mood: LCSW to follow for evaluation and support when appropriate.              -antipsychotic agents: N/A 5. Neuropsych: This patient is not capable of making decisions on his own behalf. 6. Skin/Wound Care: Routine pressure relief measures.  7. Fluids/Electrolytes/Nutrition: Monitor I/O. Intake variable.  8.  HTN: Monitor tid--Labetalol was d/c due to pauses--now on metoprolol, hydralazine and Nifedipine   4/17- BP well controlled- not too low- con't meds             Monitor with increased mobility 9.  CKD s/p renal transplant: Continues to have upward trend BUN and creatinine despite IV fluids for hydration. On prednisone  taper to 10 mg daily. Sepra DS MWF,Prograf and Myfortic.              CMP ordered.  4/17- Cr up to 1.80- and BUN 43- on steroid taper for this- since only slightly increased over last labs, will recheck in AM and call renal if goes up again.  10.  T2DM with hyperglycemia: Hgb A1c- 7.1.  Was on metformin, Starlix and Januvia PTA. Monitor blood sugars ac/hs. Continue Lantus 25 units daily and use SSI for elevated BS.    CBG (last 3)  Recent Labs    01/11/20 1653 01/11/20 2125 01/12/20 0608  GLUCAP 173* 210* 131*    4/17- BGs slightly low to well controlled in spite of high dose  prednisone  4/18- BGs 131-210 in last 24 hours- con't regimen- likely due to steroids             Monitor with increased mobility 11. Severe leukocytosis  4/17- WBC 26- up from 10k- likely due to high dose prednisone, however will check U/A and Cx and CXR and order labs for AM, including lactic acid, to make sure we monitor nd treat as needed.    4/18- Lactic acid 1.1; WBC down to 21.8- U/A not done- reordered- CXR looks good- no acute disease- likely from steroids- con't to monitor    LOS: 2 days A FACE TO FACE EVALUATION WAS PERFORMED  Henning Ehle 01/12/2020, 10:24 AM

## 2020-01-13 ENCOUNTER — Inpatient Hospital Stay (HOSPITAL_COMMUNITY): Payer: Medicare HMO | Admitting: Speech Pathology

## 2020-01-13 ENCOUNTER — Inpatient Hospital Stay (HOSPITAL_COMMUNITY): Payer: Medicare HMO | Admitting: Occupational Therapy

## 2020-01-13 ENCOUNTER — Inpatient Hospital Stay (HOSPITAL_COMMUNITY): Payer: Medicare HMO

## 2020-01-13 DIAGNOSIS — D696 Thrombocytopenia, unspecified: Secondary | ICD-10-CM

## 2020-01-13 DIAGNOSIS — D72829 Elevated white blood cell count, unspecified: Secondary | ICD-10-CM

## 2020-01-13 DIAGNOSIS — E871 Hypo-osmolality and hyponatremia: Secondary | ICD-10-CM

## 2020-01-13 DIAGNOSIS — T380X5A Adverse effect of glucocorticoids and synthetic analogues, initial encounter: Secondary | ICD-10-CM

## 2020-01-13 DIAGNOSIS — R739 Hyperglycemia, unspecified: Secondary | ICD-10-CM

## 2020-01-13 DIAGNOSIS — E1165 Type 2 diabetes mellitus with hyperglycemia: Secondary | ICD-10-CM | POA: Diagnosis not present

## 2020-01-13 DIAGNOSIS — I749 Embolism and thrombosis of unspecified artery: Secondary | ICD-10-CM | POA: Diagnosis not present

## 2020-01-13 DIAGNOSIS — I639 Cerebral infarction, unspecified: Secondary | ICD-10-CM | POA: Diagnosis not present

## 2020-01-13 DIAGNOSIS — I10 Essential (primary) hypertension: Secondary | ICD-10-CM | POA: Diagnosis not present

## 2020-01-13 DIAGNOSIS — D72828 Other elevated white blood cell count: Secondary | ICD-10-CM

## 2020-01-13 LAB — BASIC METABOLIC PANEL
Anion gap: 10 (ref 5–15)
BUN: 33 mg/dL — ABNORMAL HIGH (ref 8–23)
CO2: 15 mmol/L — ABNORMAL LOW (ref 22–32)
Calcium: 8.3 mg/dL — ABNORMAL LOW (ref 8.9–10.3)
Chloride: 107 mmol/L (ref 98–111)
Creatinine, Ser: 1.24 mg/dL (ref 0.61–1.24)
GFR calc Af Amer: >60 mL/min (ref >60)
GFR calc non Af Amer: 59 mL/min — ABNORMAL LOW (ref >60)
Glucose, Bld: 170 mg/dL — ABNORMAL HIGH (ref 70–99)
Potassium: 3.9 mmol/L (ref 3.5–5.1)
Sodium: 132 mmol/L — ABNORMAL LOW (ref 135–145)

## 2020-01-13 LAB — CBC
HCT: 38.2 % — ABNORMAL LOW (ref 39.0–52.0)
Hemoglobin: 12.6 g/dL — ABNORMAL LOW (ref 13.0–17.0)
MCH: 26.8 pg (ref 26.0–34.0)
MCHC: 33 g/dL (ref 30.0–36.0)
MCV: 81.1 fL (ref 80.0–100.0)
Platelets: 90 10*3/uL — ABNORMAL LOW (ref 150–400)
RBC: 4.71 MIL/uL (ref 4.22–5.81)
RDW: 15.5 % (ref 11.5–15.5)
WBC: 16.2 10*3/uL — ABNORMAL HIGH (ref 4.0–10.5)
nRBC: 0 % (ref 0.0–0.2)

## 2020-01-13 LAB — GLUCOSE, CAPILLARY
Glucose-Capillary: 158 mg/dL — ABNORMAL HIGH (ref 70–99)
Glucose-Capillary: 226 mg/dL — ABNORMAL HIGH (ref 70–99)
Glucose-Capillary: 226 mg/dL — ABNORMAL HIGH (ref 70–99)
Glucose-Capillary: 224 mg/dL — ABNORMAL HIGH (ref 70–99)

## 2020-01-13 NOTE — Discharge Instructions (Addendum)
Inpatient Rehab Discharge Instructions  [name] Discharge date and time: 01/25/20   Activities/Precautions/ Functional Status: Activity: no lifting, driving, or strenuous exercise till cleared by MD Diet: cardiac diet Wound Care: none needed    Functional status:  ___ No restrictions     ___ Walk up steps independently _X__ 24/7 supervision/assistance   ___ Walk up steps with assistance ___ Intermittent supervision/assistance  ___ Bathe/dress independently ___ Walk with walker     _X__ Bathe/dress with assistance ___ Walk Independently    ___ Shower independently ___ Walk with assistance    ___ Shower with assistance _X__ No alcohol     ___ Return to work/school ________    Special Instructions:  COMMUNITY REFERRALS UPON DISCHARGE:    Home Health:   PT     OT     ST                    Agency: [facility] [employer]: [phone]   Medical Equipment/Items Ordered: Producer, television/film/video                                                 Agency/Supplier: [employer]    My questions have been answered and I understand these instructions. I will adhere to these goals and the provided educational materials after my discharge from the hospital.  Patient/Caregiver Signature _______________________________ Date __________  Clinician Signature _______________________________________ Date __________  Please bring this form and your medication list with you to all your follow-up doctor's appointments. Information on my medicine - ELIQUIS (apixaban)  This medication education was reviewed with me or my healthcare representative as part of my discharge preparation.     Why was Eliquis prescribed for you? Eliquis was prescribed for you to reduce the risk of a blood clot forming that can cause a stroke if you have a medical condition called atrial fibrillation (a type of irregular heartbeat).  What do You need to know about Eliquis ? Take your Eliquis TWICE  DAILY - one tablet in the morning and one tablet in the evening with or without food. If you have difficulty swallowing the tablet whole please discuss with your pharmacist how to take the medication safely.  Take Eliquis exactly as prescribed by your doctor and DO NOT stop taking Eliquis without talking to the doctor who prescribed the medication.  Stopping may increase your risk of developing a stroke.  Refill your prescription before you run out.  After discharge, you should have regular check-up appointments with your healthcare provider that is prescribing your Eliquis.  In the future your dose may need to be changed if your kidney function or weight changes by a significant amount or as you get older.  What do you do if you miss a dose? If you miss a dose, take it as soon as you remember on the same day and resume taking twice daily.  Do not take more than one dose of ELIQUIS at the same time to make up a missed dose.  Important Safety Information A possible side effect of Eliquis is bleeding. You should call your healthcare provider right away if you experience any of the following: ? Bleeding from an injury or your nose that does not stop. ? Unusual colored urine (red or dark brown) or unusual colored stools (red or black). ? Unusual  bruising for unknown reasons. ? A serious fall or if you hit your head (even if there is no bleeding).  Some medicines may interact with Eliquis and might increase your risk of bleeding or clotting while on Eliquis. To help avoid this, consult your healthcare provider or pharmacist prior to using any new prescription or non-prescription medications, including herbals, vitamins, non-steroidal anti-inflammatory drugs (NSAIDs) and supplements.  This website has more information on Eliquis (apixaban): http://www.eliquis.com/eliquis/home

## 2020-01-13 NOTE — Progress Notes (Signed)
PHYSICAL MEDICINE & REHABILITATION PROGRESS NOTE   Subjective/Complaints: Patient seen laying in bed this morning.  No reported issues overnight.   ROS: Limited due to language/cognition.   Objective:   DG CHEST PORT 1 VIEW  Result Date: 01/11/2020 CLINICAL DATA:  Leukocytosis, CVA EXAM: PORTABLE CHEST 1 VIEW COMPARISON:  03/02/2019 chest radiograph. FINDINGS: Left rotated chest radiograph. Stable cardiomediastinal silhouette with moderate cardiomegaly. No pneumothorax. No pleural effusion. Cephalization of the pulmonary vasculature without overt pulmonary edema. No acute consolidative airspace disease. IMPRESSION: Stable moderate cardiomegaly without overt pulmonary edema. No active pulmonary disease. Electronically Signed   By: Ilona Sorrel M.D.   On: 01/11/2020 13:21   Recent Labs    01/12/20 0505 01/13/20 0532  WBC 21.4* 16.2*  HGB 12.3* 12.6*  HCT 37.6* 38.2*  PLT 95* 90*   Recent Labs    01/12/20 0505 01/13/20 0532  NA 131* 132*  K 4.3 3.9  CL 108 107  CO2 14* 15*  GLUCOSE 166* 170*  BUN 37* 33*  CREATININE 1.52* 1.24  CALCIUM 7.9* 8.3*    Intake/Output Summary (Last 24 hours) at 01/13/2020 0902 Last data filed at 01/12/2020 2020 Gross per 24 hour  Intake 462 ml  Output 175 ml  Net 287 ml     Physical Exam: Vital Signs Blood pressure (!) 149/72, pulse 81, temperature 98.8 F (37.1 C), temperature source Oral, resp. rate 18, height 5\' 11"  (1.803 m), weight 85.6 kg, SpO2 99 %. Constitutional: No distress . Vital signs reviewed. HENT: Normocephalic.  Atraumatic. Eyes: EOMI. No discharge. Cardiovascular: No JVD. Respiratory: Normal effort.  No stridor. GI: Non-distended. Skin: Warm and dry.  Intact. Psych: Normal mood.  Normal behavior. Musc: No edema in extremities.  No tenderness in extremities. Neuro: Alert Expressive >>receptive aphasia Motor: RUE/RLE: 5/5 proximal distal LUE/LLE: 4+/5 proximal to distal, except for left  ankle  Assessment/Plan: 1. Functional deficits secondary to L ACA, MCA embolic infracts which require 3+ hours per day of interdisciplinary therapy in a comprehensive inpatient rehab setting.  Physiatrist is providing close team supervision and 24 hour management of active medical problems listed below.  Physiatrist and rehab team continue to assess barriers to discharge/monitor patient progress toward functional and medical goals  Care Tool:  Bathing    Body parts bathed by patient: Right arm, Left arm, Chest, Abdomen, Right upper leg, Left upper leg, Right lower leg, Left lower leg, Face   Body parts bathed by helper: Front perineal area, Buttocks(due to large incontience)     Bathing assist Assist Level: Moderate Assistance - Patient 50 - 74%     Upper Body Dressing/Undressing Upper body dressing   What is the patient wearing?: Pull over shirt    Upper body assist Assist Level: Supervision/Verbal cueing    Lower Body Dressing/Undressing Lower body dressing      What is the patient wearing?: Incontinence brief     Lower body assist Assist for lower body dressing: Maximal Assistance - Patient 25 - 49%     Toileting Toileting    Toileting assist Assist for toileting: Total Assistance - Patient < 25%     Transfers Chair/bed transfer  Transfers assist     Chair/bed transfer assist level: Minimal Assistance - Patient > 75%     Locomotion Ambulation   Ambulation assist      Assist level: 2 helpers Assistive device: Hand held assist Max distance: 10'   Walk 10 feet activity   Assist     Assist  level: 2 helpers Assistive device: Hand held assist   Walk 50 feet activity   Assist Walk 50 feet with 2 turns activity did not occur: Safety/medical concerns         Walk 150 feet activity   Assist Walk 150 feet activity did not occur: Safety/medical concerns         Walk 10 feet on uneven surface  activity   Assist Walk 10 feet on  uneven surfaces activity did not occur: Safety/medical concerns         Wheelchair     Assist   Type of Wheelchair: Manual    Wheelchair assist level: Contact Guard/Touching assist Max wheelchair distance: 150'    Wheelchair 50 feet with 2 turns activity    Assist        Assist Level: Contact Guard/Touching assist   Wheelchair 150 feet activity     Assist      Assist Level: Contact Guard/Touching assist   Blood pressure (!) 149/72, pulse 81, temperature 98.8 F (37.1 C), temperature source Oral, resp. rate 18, height 5\' 11"  (1.803 m), weight 85.6 kg, SpO2 99 %.  Medical Problem List and Plan: 1.  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  Continue CIR 2.  Antithrombotics: -DVT/anticoagulation:  Pharmaceutical: Other (comment)--Eliquis             -antiplatelet therapy: N/A 3. Pain Management: N/A 4. Mood: LCSW to follow for evaluation and support when appropriate.              -antipsychotic agents: N/A 5. Neuropsych: This patient is not capable of making decisions on his own behalf. 6. Skin/Wound Care: Routine pressure relief measures.  7. Fluids/Electrolytes/Nutrition: Monitor I/Os.  8.  HTN: Monitor tid--Labetalol was d/c due to pauses--now on metoprolol, hydralazine and Nifedipine   Slightly labile on 4/19, monitor for trend             Monitor with increased mobility 9.  CKD s/p renal transplant: Continues to have upward trend BUN and creatinine despite IV fluids for hydration. On prednisone taper to 10 mg daily. Sepra DS MWF,Prograf and Myfortic.              Creatinine 1.24 on 4/19  Continue to monitor 10. Steroid-induced hyperglycemia on T2DM with hyperglycemia: Hgb A1c- 7.1.  Was on metformin, Starlix and Januvia PTA. Monitor blood sugars ac/hs. Continue Lantus 20 units daily and use SSI for elevated BS.    CBG (last 3)  Recent Labs    01/12/20 1636 01/12/20 2107 01/13/20 0619  GLUCAP  239* 231* 158*    Labile on 4/19, continue to monitor             Monitor with increased mobility 11. Leukocytosis-likely secondary to steroids  WBC 16.2 on 4/19  Afebrile  UA slightly borderline chest x-ray unremarkable for infection 12.  Hyponatremia  Sodium 132 on 4/19  Continue to monitor 13.  Thrombocytopenia  Platelets 90 on 4/19, labs ordered for tomorrow  Continue to monitor  LOS: 3 days A FACE TO FACE EVALUATION WAS PERFORMED  Ankit Lorie Phenix 01/13/2020, 9:02 AM

## 2020-01-13 NOTE — Progress Notes (Signed)
Occupational Therapy Session Note  Patient Details  Name: Ryan Rice MRN: [id number] Date of Birth: August 08, 1951  Today's Date: 01/13/2020 OT Individual Time: [id number] OT Individual Time Calculation (min): 70 min    Short Term Goals: No short term goals set  Skilled Therapeutic Interventions/Progress Updates:    Treatment session with focus on self-care retraining with functional transfers, dynamic standing balance, and Rt attention during self-care tasks.  Pt received semi-reclined in bed agreeable to therapy session.  Pt completed stand pivot transfers bed > w/c > toilet with Min assist.  Pt required min cues for weight shift and safety during transfer.  Pt required increased time on toilet.  Impulsively (decreased safety awareness) attempting to transfer back to w/c post toilieting without assistance, requiring CGA for safety.  Engaged in bathing and dressing at sink with setup for items and min cues for sequencing and thoroughness.  Pt completed sit > stand with CGA and standing during self-care with min assist.  Pt demonstrating improved sequencing with dressing this session, did demonstrate difficulty when donning Rt sock.  Pt demonstrating occasional expressive aphasia, with no awareness of errors.  Setup for meal, with no evidence of ideational apraxia during self-feeding or dressing this session.  Pt remained upright in w/c with seat belt alarm on and breakfast tray and all needs in reach.  Therapy Documentation Precautions:  Precautions Precautions: Fall Precaution Comments: language deficits; apraxia Restrictions Weight Bearing Restrictions: No Pain:  Pt with no c/o pain ADL: ADL Where Assessed-Eating: Chair Grooming: Minimal assistance Upper Body Bathing: Setup Where Assessed-Upper Body Bathing: Sitting at sink Lower Body Bathing: Moderate assistance Where Assessed-Lower Body Bathing: Wheelchair, Sitting at sink Upper Body Dressing: Supervision/safety Lower Body  Dressing: Moderate assistance Where Assessed-Lower Body Dressing: Sitting at sink Toileting: (total A due to incontience) Toilet Transfer: Minimal assistance Toilet Transfer Method: Stand pivot Vision   Perception    Praxis   Exercises:   Other Treatments:     Therapy/Group: Individual Therapy  [name] 01/13/2020, 9:34 AM

## 2020-01-13 NOTE — Progress Notes (Signed)
Speech Language Pathology Daily Session Note  Patient Details  Name: Ryan Rice MRN: [id number] Date of Birth: 01-20-1951  Today's Date: 01/13/2020 SLP Individual Time: [id number] SLP Individual Time Calculation (min): 45 min  Short Term Goals: Week 1: SLP Short Term Goal 1 (Week 1): Pt will name common objects in 8 out of 10 opportunities with Min A cues. SLP Short Term Goal 2 (Week 1): Pt will answer basic yes/no questions in 8 out of 10 opportunities with Min A cues. SLP Short Term Goal 3 (Week 1): Pt will use multimodal means to communicate basic wants and needs with Mod A cues. SLP Short Term Goal 4 (Week 1): Pt will follow 1 step-directions in 8 out of 10 opportunities with Min A cues. SLP Short Term Goal 5 (Week 1): Pt will complete basic problem solving tasks with Min A cues.  Skilled Therapeutic Interventions: Pt was seen for skilled ST intervention targeting aforementioned goals. Pt was initially pleasant and cooperative with unfamiliar therapist. SLP facilitated session by administering the Reading Comprehension Battery for Aphasia (RCBA). Pt was 60% accurate for visual confusion subtest, 80% accurate for auditory confusion, and 40% accurate for semantic confusion. Functional reading subtest was 10% accurate. At that point, pt may have been fatigued, as his willingness to participate declined significantly, with pt no longer following directions (point to the word that goes with the picture) as he had earlier in the session. Pt was encouraged to continue participation another time, as this assessment is helpful to determine appropriate course of aphasia treatment for maximum rehab benefit.  Pt was left in wheelchair with alarm on, all needs within reach. Continue ST per current plan of care.   Pain Pain Assessment Pain Scale: 0-10 Pain Score: 0-No pain  Therapy/Group: Individual Therapy   Makaylia Hewett B. Quentin Ore, [facility], CCC-SLP Speech Language Pathologist  Shonna Chock 01/13/2020, 12:31 PM

## 2020-01-13 NOTE — Progress Notes (Signed)
Social Work Assessment and Plan   Patient Details  Name: Ryan Rice MRN: [id number] Date of Birth: Nov 09, 1950  Today's Date: 01/13/2020  Problem List:  Patient Active Problem List   Diagnosis Date Noted  . Leukocytosis   . Thrombocytopenia ([place])   . Hyponatremia   . Steroid-induced hyperglycemia   . Ischemic stroke of frontal lobe ([place]) 01/10/2020  . Embolic infarction ([place]) 01/10/2020  . Controlled type 2 diabetes mellitus with hyperglycemia, without long-term current use of insulin ([place])   . Renal transplant, status post   . Essential hypertension   . Cerebral embolism with cerebral infarction 01/03/2020  . Brain tumor ([place]) 12/30/2019  . Hip pain, bilateral 12/30/2019  . Elevated PSA 12/30/2019  . Atrial fibrillation, chronic ([place]) 12/30/2019  . Hypomagnesemia 03/20/2019  . Diabetes ([place]) 03/15/2019  . Glaucoma 03/15/2019  . Scleral hemorrhage of left eye 03/15/2019  . Gout flare 03/15/2019  . Debility 03/08/2019  . AKI (acute kidney injury) ([place])   . CAP (community acquired pneumonia)   . Kidney transplant recipient   . Sepsis ([place]) 02/27/2019   Past Medical History:  Past Medical History:  Diagnosis Date  . Allergic rhinitis   . BPH (benign prostatic hyperplasia)   . Chronic atrial fibrillation ([place])   . Diabetes mellitus ([place])   . DVT (deep venous thrombosis) ([facility])    LLE  . Hematochezia   . HTN (hypertension)   . Hypomagnesemia 03/20/2019  . Kidney transplant recipient   . PVD (peripheral vascular disease) ([place])    Past Surgical History:  Past Surgical History:  Procedure Laterality Date  . KIDNEY TRANSPLANT     Social History:  reports that he quit smoking about 35 years ago. His smoking use included cigarettes. He has never used smokeless tobacco. He reports current alcohol use. He reports that he does not use drugs.  Family / Support Systems Marital Status: Married Patient Roles: Spouse Children: 3 children. Ages 84, 92, 19 Other Supports:  Ryan Rice Anticipated Caregiver: Spouse: Ryan Rice Ability/Limitations of Caregiver: N/A Caregiver Availability: 24/7 Family Dynamics: N/A  Social History Preferred language: English Religion: Baptist Cultural Background: Retired Hotel manager , loves yard work Education: Some college Read: Yes Write: Yes Employment Status: Retired Public relations account executive Issues: N/A   Abuse/Neglect Abuse/Neglect Assessment Can Be Completed: Yes Physical Abuse: Denies Verbal Abuse: Denies Sexual Abuse: Denies Exploitation of patient/patient's resources: Denies Self-Neglect: Denies  Emotional Status Pt's affect, behavior and adjustment status: patient reports no ajustment, SW will monitor. Recent Psychosocial Issues: N/A Psychiatric History: N/A Substance Abuse History: Spouse states "not really"  Patient / Family Perceptions, Expectations & Goals Pt/Family understanding of illness & functional limitations: Yes Anticipated changes in roles/activities/participation: N/A Pt/family expectations/goals: Goal for patient to discharge back home  [employer]: None Transportation available at discharge: spouse able to transport  Discharge Planning Living Arrangements: Spouse/significant other, Other (Comment)(Grand daughter 37) Support Systems: Spouse/significant other, Other relatives(Grand daughter, daughter in Sports coach) Type of Residence: Private residence Administrator, sports: Multimedia programmer (specify)([employer]) Financial Resources: [place] Referred: Yes Living Expenses: Own Money Management: Patient, Spouse Does the patient have any problems obtaining your medications?: No Social Work Anticipated Follow Up Needs: HH/OP  Clinical Impression Sw entered to room, introduced self to patient, explained d/c process. Patient confused SW called spouse in room to assist with assessment. SW also explained processes to spouse and  answered questions and concerns. SW will follow up with spouse after team conference.  Ryan Rice  Ryan Rice 01/13/2020, 10:43 AM

## 2020-01-13 NOTE — Progress Notes (Signed)
Inpatient Rehabilitation  Patient information reviewed and entered into eRehab system by Jahden Schara M. Aya Geisel, M.A., CCC/SLP, PPS Coordinator.  Information including medical coding, functional ability and quality indicators will be reviewed and updated through discharge.    

## 2020-01-13 NOTE — Plan of Care (Signed)
  Problem: Consults Goal: RH STROKE PATIENT EDUCATION Description: See Patient Education module for education specifics  Outcome: Progressing Goal: Diabetes Guidelines if Diabetic/Glucose > 140 Description: If diabetic or lab glucose is > 140 mg/dl - Initiate Diabetes/Hyperglycemia Guidelines & Document Interventions  Outcome: Progressing   Problem: RH BOWEL ELIMINATION Goal: RH STG MANAGE BOWEL WITH ASSISTANCE Description: STG Manage Bowel with min Assistance. Outcome: Progressing Flowsheets (Taken 01/13/2020 1424) STG: Pt will manage bowels with assistance: 3-Moderate assistance Goal: RH STG MANAGE BOWEL W/MEDICATION W/ASSISTANCE Description: STG Manage Bowel with Medication with  minAssistance. Outcome: Progressing Flowsheets (Taken 01/13/2020 1424) STG: Pt will manage bowels with medication with assistance: 3-Moderate assistance   Problem: RH BLADDER ELIMINATION Goal: RH STG MANAGE BLADDER WITH ASSISTANCE Description: STG Manage Bladder With min Assistance Outcome: Progressing Flowsheets (Taken 01/13/2020 1424) STG: Pt will manage bladder with assistance: 5-Supervision/set up Goal: RH STG MANAGE BLADDER WITH MEDICATION WITH ASSISTANCE Description: STG Manage Bladder With Medication With min Assistance. Outcome: Progressing Flowsheets (Taken 01/13/2020 1424) STG: Pt will manage bladder with medication with assistance: 5-Supervision/set up   Problem: RH SKIN INTEGRITY Goal: RH STG MAINTAIN SKIN INTEGRITY WITH ASSISTANCE Description: STG Maintain Skin Integrity With supervision Assistance. Outcome: Progressing Flowsheets (Taken 01/13/2020 1424) STG: Maintain skin integrity with assistance: 3-Moderate assistance Goal: RH STG ABLE TO PERFORM INCISION/WOUND CARE W/ASSISTANCE Description: STG Able To Perform Incision/Wound Care With supervision Assistance. Outcome: Progressing Flowsheets (Taken 01/13/2020 1424) STG: Pt will be able to perform incision/wound care with assistance:  6-Modified independent   Problem: RH SAFETY Goal: RH STG ADHERE TO SAFETY PRECAUTIONS W/ASSISTANCE/DEVICE Description: STG Adhere to Safety Precautions With min Assistance and appropriate assistive Device. Outcome: Progressing Flowsheets (Taken 01/13/2020 1424) STG:Pt will adhere to safety precautions with assistance/device: 4-Minimal assistance   Problem: RH PAIN MANAGEMENT Goal: RH STG PAIN MANAGED AT OR BELOW PT'S PAIN GOAL Description: <3 on a 0-10 pain scale Outcome: Progressing   Problem: RH KNOWLEDGE DEFICIT Goal: RH STG INCREASE KNOWLEDGE OF DIABETES Description: Patient will demonstrate knowledge on diabetic medications, dietary restrictions, and follow up care with the MD with min assist from [place] staff. Outcome: Progressing Goal: RH STG INCREASE KNOWLEDGE OF HYPERTENSION Description: Patient will demonstrate knowledge on HTN medications, dietary restrictions, and follow up care with the MD with min assist from [facility] staff.  Outcome: Progressing Goal: RH STG INCREASE KNOWLEGDE OF HYPERLIPIDEMIA Description: Patient will demonstrate knowledge on HLD medications, dietary restrictions, and follow up care with the MD with min assist from [place] staff.  Outcome: Progressing Goal: RH STG INCREASE KNOWLEDGE OF STROKE PROPHYLAXIS Description: Patient will demonstrate knowledge on stroke prevention medications, dietary restrictions, and follow up care with the MD with min assist from [place] staff.  Outcome: Progressing

## 2020-01-13 NOTE — Care Management (Addendum)
[facility] Individual Statement of Services  Patient Name:  Ryan Rice  Date:  01/13/2020  Welcome to the [facility].  Our goal is to provide you with an individualized program based on your diagnosis and situation, designed to meet your specific needs.  With this comprehensive rehabilitation program, you will be expected to participate in at least 3 hours of rehabilitation therapies Monday-Friday, with modified therapy programming on the weekends.  Your rehabilitation program will include the following services:  Physical Therapy (PT), Occupational Therapy (OT), Speech Therapy (ST), 24 hour per day rehabilitation nursing, Therapeutic Recreaction (TR), Neuropsychology, Case Management (Social Worker), Rehabilitation Medicine, Nutrition Services, Pharmacy Services and Other  Weekly team conferences will be held on Wednesdays to discuss your progress.  Your Social Worker will talk with you frequently to get your input and to update you on team discussions.  Team conferences with you and your family in attendance may also be held.  Expected length of stay: 12-15 Days  Overall anticipated outcome: Supervision-Min A  Depending on your progress and recovery, your program may change. Your Social Worker will coordinate services and will keep you informed of any changes. Your Social Worker's name and contact numbers are listed  below.  The following services may also be recommended but are not provided by the [facility]:    [facility] will be made to provide these services after discharge if needed.  Arrangements include referral to agencies that provide these services.  Your insurance has been verified to be:  [employer] Your primary doctor is:  Butch Penny Drosinis  Pertinent information will be shared with your doctor and your insurance company.  Social  Worker:  Erlene Quan, [place] or ([phone]   Information discussed with and copy given to patient by: Dyanne Iha, 01/13/2020, 9:31 AM

## 2020-01-13 NOTE — Progress Notes (Signed)
Physical Therapy Session Note  Patient Details  Name: Destry Dawn MRN: [id number] Date of Birth: [birth year]  Today's Date: 01/13/2020 PT Individual Time: 1300-1413 PT Individual Time Calculation (min): 73 min   Short Term Goals: Week 1:  PT Short Term Goal 1 (Week 1): Pt will be able to perform transfers with CGA PT Short Term Goal 2 (Week 1): Pt will be able to demonstrate functional dynamic standing balane with CGA PT Short Term Goal 3 (Week 1): Pt will be able to gait x 100' wiht min assist  Skilled Therapeutic Interventions/Progress Updates:   Received pt sitting in WC with lunch tray sitting in front of him untouched, pt agreeable to therapy, and denied any pain during session. Session focused on eating, functional mobility/transfers, LE strength, dynamic standing balance/coordination, ambulation, and improved endurance with activity. Therapist encouraged pt to eat a few bites of lunch prior to going down to the gym (pt did not realize that he hadn't eaten). Pt required min A to cut food and encouragement to continue to eat as pt frequently getting distracted by TV. Pt with significant dysarthria throughout session making communication difficult; however pt able to answer yes/no questions. Pt transported to dayroom in [facility] total assist for time management purposes. Pt transferred stand<>pivot WC<>mat with RW min A. Pt ambulated 37ft with RW min A and required verbal cues for RW safety and turning technique. Pt performed standing alternating toe taps to 3in step with bilateral UE support on RW 2x20 with CGA/min A. Pt required verbal cues for slow and controlled movement. Pt transferred sit<>stand and worked on dynamic standing balance playing cornhole with UE support on RW CGA/min A x 3 trials. Pt transferred sit<>stand x5 reps with UE support on mat CGA and x5 reps without UE support min A with increased time and verbal cues for anterior weight shifting. Pt ambulated 63ft with RW min A to Nustep  and passed Nustep requiring max cues to redirect and mod A to pivot with RW to Nustep seat. Pt with difficulty turning and sequencing transfer. Pt performed bilateral LE strengthening on Nustep at workload 2 for 5 minutes x 1 trial and additional 1 minute x 1 trial. Pt with increased fatigue with activities demonstrated by pt frequently stopping mid-activity to rest. Pt transferred stand<>pivot Nustep<>WC with RW min A and was transported back to room total assist. Concluded session with pt sitting in WC, needs within reach, and seatbelt alarm on.   Therapy Documentation Precautions:  Precautions Precautions: Fall Precaution Comments: language deficits; apraxia Restrictions Weight Bearing Restrictions: No  Therapy/Group: Individual Therapy Alfonse Alpers PT, DPT   01/13/2020, 7:31 AM

## 2020-01-13 NOTE — Plan of Care (Signed)
  Problem: Consults Goal: RH STROKE PATIENT EDUCATION Description: See Patient Education module for education specifics  Outcome: Progressing Goal: Diabetes Guidelines if Diabetic/Glucose > 140 Description: If diabetic or lab glucose is > 140 mg/dl - Initiate Diabetes/Hyperglycemia Guidelines & Document Interventions  Outcome: Progressing   Problem: RH BOWEL ELIMINATION Goal: RH STG MANAGE BOWEL WITH ASSISTANCE Description: STG Manage Bowel with min Assistance. Outcome: Progressing Goal: RH STG MANAGE BOWEL W/MEDICATION W/ASSISTANCE Description: STG Manage Bowel with Medication with  minAssistance. Outcome: Progressing   Problem: RH BLADDER ELIMINATION Goal: RH STG MANAGE BLADDER WITH ASSISTANCE Description: STG Manage Bladder With min Assistance Outcome: Progressing Goal: RH STG MANAGE BLADDER WITH MEDICATION WITH ASSISTANCE Description: STG Manage Bladder With Medication With min Assistance. Outcome: Progressing   Problem: RH SKIN INTEGRITY Goal: RH STG MAINTAIN SKIN INTEGRITY WITH ASSISTANCE Description: STG Maintain Skin Integrity With supervision Assistance. Outcome: Progressing Goal: RH STG ABLE TO PERFORM INCISION/WOUND CARE W/ASSISTANCE Description: STG Able To Perform Incision/Wound Care With supervision Assistance. Outcome: Progressing   Problem: RH SAFETY Goal: RH STG ADHERE TO SAFETY PRECAUTIONS W/ASSISTANCE/DEVICE Description: STG Adhere to Safety Precautions With min Assistance and appropriate assistive Device. Outcome: Progressing   Problem: RH PAIN MANAGEMENT Goal: RH STG PAIN MANAGED AT OR BELOW PT'S PAIN GOAL Description: <3 on a 0-10 pain scale Outcome: Progressing   Problem: RH KNOWLEDGE DEFICIT Goal: RH STG INCREASE KNOWLEDGE OF DIABETES Description: Patient will demonstrate knowledge on diabetic medications, dietary restrictions, and follow up care with the MD with min assist from [facility] staff. Outcome: Progressing Goal: RH STG INCREASE KNOWLEDGE OF  HYPERTENSION Description: Patient will demonstrate knowledge on HTN medications, dietary restrictions, and follow up care with the MD with min assist from [place] staff.  Outcome: Progressing Goal: RH STG INCREASE KNOWLEGDE OF HYPERLIPIDEMIA Description: Patient will demonstrate knowledge on HLD medications, dietary restrictions, and follow up care with the MD with min assist from [place] staff.  Outcome: Progressing Goal: RH STG INCREASE KNOWLEDGE OF STROKE PROPHYLAXIS Description: Patient will demonstrate knowledge on stroke prevention medications, dietary restrictions, and follow up care with the MD with min assist from [facility] staff.  Outcome: Progressing

## 2020-01-14 ENCOUNTER — Inpatient Hospital Stay (HOSPITAL_COMMUNITY): Payer: Medicare HMO | Admitting: Occupational Therapy

## 2020-01-14 ENCOUNTER — Inpatient Hospital Stay (HOSPITAL_COMMUNITY): Payer: Medicare HMO

## 2020-01-14 ENCOUNTER — Inpatient Hospital Stay (HOSPITAL_COMMUNITY): Payer: Medicare HMO | Admitting: Speech Pathology

## 2020-01-14 DIAGNOSIS — E1165 Type 2 diabetes mellitus with hyperglycemia: Secondary | ICD-10-CM | POA: Diagnosis not present

## 2020-01-14 DIAGNOSIS — I749 Embolism and thrombosis of unspecified artery: Secondary | ICD-10-CM | POA: Diagnosis not present

## 2020-01-14 DIAGNOSIS — I639 Cerebral infarction, unspecified: Secondary | ICD-10-CM | POA: Diagnosis not present

## 2020-01-14 DIAGNOSIS — I10 Essential (primary) hypertension: Secondary | ICD-10-CM | POA: Diagnosis not present

## 2020-01-14 LAB — CBC WITH DIFFERENTIAL/PLATELET
Abs Immature Granulocytes: 0.12 10*3/uL — ABNORMAL HIGH (ref 0.00–0.07)
Basophils Absolute: 0 10*3/uL (ref 0.0–0.1)
Basophils Relative: 0 %
Eosinophils Absolute: 0 10*3/uL (ref 0.0–0.5)
Eosinophils Relative: 0 %
HCT: 41.2 % (ref 39.0–52.0)
Hemoglobin: 13.3 g/dL (ref 13.0–17.0)
Immature Granulocytes: 1 %
Lymphocytes Relative: 1 %
Lymphs Abs: 0.1 10*3/uL — ABNORMAL LOW (ref 0.7–4.0)
MCH: 26.2 pg (ref 26.0–34.0)
MCHC: 32.3 g/dL (ref 30.0–36.0)
MCV: 81.1 fL (ref 80.0–100.0)
Monocytes Absolute: 0.7 10*3/uL (ref 0.1–1.0)
Monocytes Relative: 4 %
Neutro Abs: 14.7 10*3/uL — ABNORMAL HIGH (ref 1.7–7.7)
Neutrophils Relative %: 94 %
Platelets: 112 10*3/uL — ABNORMAL LOW (ref 150–400)
RBC: 5.08 MIL/uL (ref 4.22–5.81)
RDW: 15.9 % — ABNORMAL HIGH (ref 11.5–15.5)
WBC: 15.6 10*3/uL — ABNORMAL HIGH (ref 4.0–10.5)
nRBC: 0 % (ref 0.0–0.2)

## 2020-01-14 LAB — GLUCOSE, CAPILLARY
Glucose-Capillary: 162 mg/dL — ABNORMAL HIGH (ref 70–99)
Glucose-Capillary: 168 mg/dL — ABNORMAL HIGH (ref 70–99)
Glucose-Capillary: 202 mg/dL — ABNORMAL HIGH (ref 70–99)
Glucose-Capillary: 185 mg/dL — ABNORMAL HIGH (ref 70–99)
Glucose-Capillary: 190 mg/dL — ABNORMAL HIGH (ref 70–99)

## 2020-01-14 NOTE — Progress Notes (Signed)
Physical Therapy Session Note  Patient Details  Name: Ryan Rice MRN: [id number] Date of Birth: 05-20-1951  Today's Date: 01/14/2020 PT Individual Time: 0800-0857 and [id number] PT Individual Time Calculation (min): 57 min and 25 min  Short Term Goals: Week 1:  PT Short Term Goal 1 (Week 1): Pt will be able to perform transfers with CGA PT Short Term Goal 2 (Week 1): Pt will be able to demonstrate functional dynamic standing balane with CGA PT Short Term Goal 3 (Week 1): Pt will be able to gait x 100' wiht min assist  Skilled Therapeutic Interventions/Progress Updates:   Treatment Session 1: [id number] 57 min Received pt supine in bed eating breakfast, pt agreeable to therapy, and denied any pain during session. Pt agreeable to continue eating while sitting EOB for improved digestion, sitting balance, and strength. Session focused on eating, dressing, functional mobility/transfers, LE strength, dynamic sitting/standing balance/coordination, ambulation, and improved activity tolerance. Pt donned scrub pants supine in bed with mod A. Pt transferred supine<>sitting EOB from flat bed with CGA and increased time. Donned shoes sitting EOB with max A. Pt worked on dynamic sitting balance sitting EOB while finishing breakfast x 15 minutes. Pt with increased time spent sitting EOB eating, and pt frequently distracted and forgetting to attend to food. When therapist asked if pt was done eating pt stated "not yet" however, required min cues to continue eating. MD present for brief pt assessment during session. When asked if pt needed to use restroom, pt responded "yes" and ambulated 51ft x 2 trials with RW mod A to/from bathroom. Pt with significant apraxia seen throughout today's session. Pt initially trying to walk out the door instead of to bathroom; requiring cues for redirection. Pt with poor RW safety awareness requiring max cues to remain close to RW and safety when turning. Doffed soiled brief and  pants with max A. Pt with small BM smear and able to perform hygiene management with CGA. Pt required max A to pull pants over hips in standing. Pt washed hands at sink seated in WC with min A. Pt initially trying to use shampoo instead of soap to wash hands and reaching into closet for paper towels to dry hands; requiring mod cues for redirection. Pt transported to therapy gym in [facility] total assist for time management purposes. Pt ambulated 48ft with RW min A/mod A when turning due to apraxia. Pt required verbal cues to stand upright, for RW safety, and sequencing when turning. Pt transferred sit<>stand without AD and UE support on armrests x 5 reps with min A. Pt with increased fatigue and difficulty with activity as demonstrated by pt using Gower's maneuver along LEs to climb to back into standing position. Pt transported back to room in [facility] total asssit. Concluded session with pt sitting in WC, needs within reach, and seatbelt alarm on. Therapist provided pt with fresh cup of ice water as pt trying to drink water out of pitcher.   Treatment Session 2: [id number] 25 min Received pt sitting in WC, pt agreeable to therapy, and denied any pain during session. Session focused on functional mobility/transfers, LE strength, dynamic standing balance/coordination, ambulation, and improved activity tolerance. Pt transported to dayroom in [facility] total assist. Pt ambulated 142ft with RW min/mod A. Pt required verbal cues to remain inside RW for safety, to stand upright, and turning technique. Pt worked on standing dynamic balance playing cornhole without AD and CGA x 4 trials. Pt spontaneously stopping mid-activity due to fatigue. Pt transported back to room  in [facility] total assist. Concluded session with pt sitting in WC, needs within reach, and seatbelt alarm on. Therapist provided pt with fresh drink.   Therapy Documentation Precautions:  Precautions Precautions: Fall Precaution Comments: language deficits;  apraxia Restrictions Weight Bearing Restrictions: No  Therapy/Group: Individual Therapy Alfonse Alpers PT, DPT   01/14/2020, 7:28 AM

## 2020-01-14 NOTE — Progress Notes (Signed)
Speech Language Pathology Daily Session Note  Patient Details  Name: Ryan Rice MRN: [id number] Date of Birth: [birth year]  Today's Date: 01/14/2020 SLP Individual Time: 1000-1053 SLP Individual Time Calculation (min): 53 min  Short Term Goals: Week 1: SLP Short Term Goal 1 (Week 1): Pt will name common objects in 8 out of 10 opportunities with Min A cues. SLP Short Term Goal 2 (Week 1): Pt will answer basic yes/no questions in 8 out of 10 opportunities with Min A cues. SLP Short Term Goal 3 (Week 1): Pt will use multimodal means to communicate basic wants and needs with Mod A cues. SLP Short Term Goal 4 (Week 1): Pt will follow 1 step-directions in 8 out of 10 opportunities with Min A cues. SLP Short Term Goal 5 (Week 1): Pt will complete basic problem solving tasks with Min A cues.  Skilled Therapeutic Interventions: Pt was seen for skilled ST intervention targeting aforementioned goals. Pt was awake and alert, cooperative with RN providing medications. Pt named color photographs of common objects with 68% accuracy. Some perseveration, and appeared to have difficulty with colors. Pt verbalized that he was able to distinguish colors. Yes/No responses to simple questions are yes dominant, with "no" responses elicited 4x (appropriate "no" responses would have been for 10 questions). Pt followed 1-step verbal commands with 100% accuracy. Expressively, pt was able to list days of the week, months of the year, and count to 10 with minimal perseveration (required "begin with January" cue when perseverated on days of the week). Pt was able to repeat phrase length material without error. Sentence completion with 71% accuracy. Responsive naming task 20% accurate. Pt was able to name 2 members of a concrete category (animals), repeating them over and over. Pt required max multimodal cues to organize pictures by color (given items predominately red, yellow, and blue).   Pt was left in wheelchair with  alarm on, all needs within reach. Continue ST per current plan of care.   Pain Pain Assessment Pain Scale: 0-10 Pain Score: 0-No pain  Therapy/Group: Individual Therapy  Ryan Rice B. Ryan Rice, [facility], CCC-SLP Speech Language Pathologist  Ryan Rice 01/14/2020, 11:04 AM

## 2020-01-14 NOTE — Progress Notes (Addendum)
Occupational Therapy Session Note  Patient Details  Name: Ryan Rice MRN: [id number] Date of Birth: [birth year]  Today's Date: 01/14/2020 OT Individual Time: 1305-1405 OT Individual Time Calculation (min): 60 min    Short Term Goals: No STG set  Skilled Therapeutic Interventions/Progress Updates:    Treatment session with focus on functional transfers, standing tolerance, and following one to two step commands.  Pt received upright in w/c finishing lunch.  Pt required mod cues for initiation as he was easily distracted internally and externally.  Engaged in table top tasks initially in standing, however ultimately requiring sitting due to decreased standing tolerance.  Completed pipe tree puzzle x2 with pt requiring max multimodal cues and use of external aids as well as decreased amount of supplies to increase success.  Then transitioned to utilizing simple pattern pushpin activity with 2 colors, pt making errors 75% of the time when given 4 colors to choose from.  When therapist holding both colors in hand, pt able to correctly choose 100% of time.  Attempted again to provide less structure with pt again making more errors.  Completed all stand pivot transfers and sit > stand with Min assist to CGA.  Pt remained upright in w/c with seat belt alarm on and all needs in reach.    Pt's expressive and receptive aphasia continue to impact success in participation.    Therapy Documentation Precautions:  Precautions Precautions: Fall Precaution Comments: language deficits; apraxia Restrictions Weight Bearing Restrictions: No General:   Vital Signs: Therapy Vitals Temp: 98.1 F (36.7 C) Temp Source: Oral Pulse Rate: 81 Resp: 16 BP: 115/74 Patient Position (if appropriate): Sitting Oxygen Therapy SpO2: 98 % O2 Device: Room Air Pain:  Pt with no c/o pain   Therapy/Group: Individual Therapy  [name] 01/14/2020, 3:29 PM

## 2020-01-14 NOTE — Plan of Care (Signed)
  Problem: Consults Goal: RH STROKE PATIENT EDUCATION Description: See Patient Education module for education specifics  Outcome: Progressing Goal: Diabetes Guidelines if Diabetic/Glucose > 140 Description: If diabetic or lab glucose is > 140 mg/dl - Initiate Diabetes/Hyperglycemia Guidelines & Document Interventions  Outcome: Progressing   Problem: RH BOWEL ELIMINATION Goal: RH STG MANAGE BOWEL WITH ASSISTANCE Description: STG Manage Bowel with min Assistance. Outcome: Progressing Goal: RH STG MANAGE BOWEL W/MEDICATION W/ASSISTANCE Description: STG Manage Bowel with Medication with  minAssistance. Outcome: Progressing   Problem: RH BLADDER ELIMINATION Goal: RH STG MANAGE BLADDER WITH ASSISTANCE Description: STG Manage Bladder With min Assistance Outcome: Progressing Goal: RH STG MANAGE BLADDER WITH MEDICATION WITH ASSISTANCE Description: STG Manage Bladder With Medication With min Assistance. Outcome: Progressing   Problem: RH SKIN INTEGRITY Goal: RH STG MAINTAIN SKIN INTEGRITY WITH ASSISTANCE Description: STG Maintain Skin Integrity With supervision Assistance. Outcome: Progressing Goal: RH STG ABLE TO PERFORM INCISION/WOUND CARE W/ASSISTANCE Description: STG Able To Perform Incision/Wound Care With supervision Assistance. Outcome: Progressing   Problem: RH SAFETY Goal: RH STG ADHERE TO SAFETY PRECAUTIONS W/ASSISTANCE/DEVICE Description: STG Adhere to Safety Precautions With min Assistance and appropriate assistive Device. Outcome: Progressing   Problem: RH PAIN MANAGEMENT Goal: RH STG PAIN MANAGED AT OR BELOW PT'S PAIN GOAL Description: <3 on a 0-10 pain scale Outcome: Progressing   Problem: RH KNOWLEDGE DEFICIT Goal: RH STG INCREASE KNOWLEDGE OF DIABETES Description: Patient will demonstrate knowledge on diabetic medications, dietary restrictions, and follow up care with the MD with min assist from [facility] staff. Outcome: Progressing Goal: RH STG INCREASE KNOWLEDGE OF  HYPERTENSION Description: Patient will demonstrate knowledge on HTN medications, dietary restrictions, and follow up care with the MD with min assist from [facility] staff.  Outcome: Progressing Goal: RH STG INCREASE KNOWLEGDE OF HYPERLIPIDEMIA Description: Patient will demonstrate knowledge on HLD medications, dietary restrictions, and follow up care with the MD with min assist from [facility] staff.  Outcome: Progressing Goal: RH STG INCREASE KNOWLEDGE OF STROKE PROPHYLAXIS Description: Patient will demonstrate knowledge on stroke prevention medications, dietary restrictions, and follow up care with the MD with min assist from [facility] staff.  Outcome: Progressing

## 2020-01-14 NOTE — Progress Notes (Signed)
[facility] PHYSICAL MEDICINE & REHABILITATION PROGRESS NOTE   Subjective/Complaints: Patient seen sitting up at the edge of his bed this morning working with therapies.  He indicates he slept well overnight.  Discussed aphasia with therapies.  Patient appears to be improving.  ROS: Limited due to language/cognition, but appears to deny CP, shortness of breath, nausea, vomiting, diarrhea.  Objective:   No results found. Recent Labs    01/13/20 0532 01/14/20 0537  WBC 16.2* 15.6*  HGB 12.6* 13.3  HCT 38.2* 41.2  PLT 90* 112*   Recent Labs    01/12/20 0505 01/13/20 0532  NA 131* 132*  K 4.3 3.9  CL 108 107  CO2 14* 15*  GLUCOSE 166* 170*  BUN 37* 33*  CREATININE 1.52* 1.24  CALCIUM 7.9* 8.3*    Intake/Output Summary (Last 24 hours) at 01/14/2020 0849 Last data filed at 01/14/2020 0846 Gross per 24 hour  Intake 720 ml  Output 250 ml  Net 470 ml     Physical Exam: Vital Signs Blood pressure (!) 165/88, pulse 87, temperature 98.6 F (37 C), temperature source Oral, resp. rate 17, height 5\' 11"  (1.803 m), weight 85.6 kg, SpO2 98 %. Constitutional: No distress . Vital signs reviewed. HENT: Normocephalic.  Atraumatic. Eyes: EOMI. No discharge. Cardiovascular: No JVD. Respiratory: Normal effort.  No stridor. GI: Non-distended. Skin: Warm and dry.  Intact. Psych: Normal mood.  Normal behavior. Musc: No edema in extremities.  No tenderness in extremities. Neuro: Alert Expressive >>receptive aphasia, improving Motor: RUE/RLE: 5/5 proximal distal LUE/LLE: 4+/5 proximal to distal, except for left ankle, unchanged  Assessment/Plan: 1. Functional deficits secondary to L ACA, MCA embolic infracts which require 3+ hours per day of interdisciplinary therapy in a comprehensive inpatient rehab setting.  Physiatrist is providing close team supervision and 24 hour management of active medical problems listed below.  Physiatrist and rehab team continue to assess barriers to  discharge/monitor patient progress toward functional and medical goals  Care Tool:  Bathing    Body parts bathed by patient: Right arm, Left arm, Chest, Abdomen, Front perineal area, Buttocks, Right upper leg, Left upper leg, Right lower leg, Left lower leg, Face   Body parts bathed by helper: Front perineal area, Buttocks(due to large incontience)     Bathing assist Assist Level: Minimal Assistance - Patient > 75%     Upper Body Dressing/Undressing Upper body dressing   What is the patient wearing?: Pull over shirt    Upper body assist Assist Level: Supervision/Verbal cueing    Lower Body Dressing/Undressing Lower body dressing      What is the patient wearing?: Incontinence brief, Pants     Lower body assist Assist for lower body dressing: Moderate Assistance - Patient 50 - 74%     Toileting Toileting    Toileting assist Assist for toileting: Maximal Assistance - Patient 25 - 49%     Transfers Chair/bed transfer  Transfers assist     Chair/bed transfer assist level: Moderate Assistance - Patient 50 - 74%     Locomotion Ambulation   Ambulation assist      Assist level: Minimal Assistance - Patient > 75% Assistive device: Walker-rolling Max distance: 45   Walk 10 feet activity   Assist     Assist level: Minimal Assistance - Patient > 75% Assistive device: Walker-rolling   Walk 50 feet activity   Assist Walk 50 feet with 2 turns activity did not occur: Safety/medical concerns  Walk 150 feet activity   Assist Walk 150 feet activity did not occur: Safety/medical concerns         Walk 10 feet on uneven surface  activity   Assist Walk 10 feet on uneven surfaces activity did not occur: Safety/medical concerns         Wheelchair     Assist   Type of Wheelchair: Manual    Wheelchair assist level: Contact Guard/Touching assist Max wheelchair distance: 150'    Wheelchair 50 feet with 2 turns  activity    Assist        Assist Level: Contact Guard/Touching assist   Wheelchair 150 feet activity     Assist      Assist Level: Contact Guard/Touching assist   Blood pressure (!) 165/88, pulse 87, temperature 98.6 F (37 C), temperature source Oral, resp. rate 17, height 5\' 11"  (1.803 m), weight 85.6 kg, SpO2 98 %.  Medical Problem List and Plan: 1.  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  Continue CIR 2.  Antithrombotics: -DVT/anticoagulation:  Pharmaceutical: Other (comment)--Eliquis             -antiplatelet therapy: N/A 3. Pain Management: N/A 4. Mood: LCSW to follow for evaluation and support when appropriate.              -antipsychotic agents: N/A 5. Neuropsych: This patient is not capable of making decisions on his own behalf. 6. Skin/Wound Care: Routine pressure relief measures.  7. Fluids/Electrolytes/Nutrition: Monitor I/Os.  8.  HTN: Monitor tid--Labetalol was d/c due to pauses--now on metoprolol, hydralazine and Nifedipine   Labile on 4/20, monitor for trend             Monitor with increased mobility 9.  CKD s/p renal transplant: Continues to have upward trend BUN and creatinine despite IV fluids for hydration. On prednisone taper to 10 mg daily. Sepra DS MWF,Prograf and Myfortic.              Creatinine 1.24 on 4/19  Continue to monitor 10. Steroid-induced hyperglycemia on T2DM with hyperglycemia: Hgb A1c- 7.1.  Was on metformin, Starlix and Januvia PTA. Monitor blood sugars ac/hs. Continue Lantus 20 units daily and use SSI for elevated BS.    CBG (last 3)  Recent Labs    01/13/20 1742 01/13/20 2055 01/14/20 0613  GLUCAP 226* 224* 162*    Labile on 4/20, monitor with steroid taper             Monitor with increased mobility 11. Leukocytosis-likely secondary to steroids  WBC 15.6 on 4/20  Afebrile  UA slightly borderline chest x-ray unremarkable for infection 12.   Hyponatremia  Sodium 132 on 4/19  Continue to monitor 13.  Thrombocytopenia  Platelets 112 on 4/20  Continue to monitor  LOS: 4 days A FACE TO FACE EVALUATION WAS PERFORMED  Lurlene Ronda Lorie Phenix 01/14/2020, 8:49 AM

## 2020-01-15 ENCOUNTER — Inpatient Hospital Stay (HOSPITAL_COMMUNITY): Payer: Medicare HMO | Admitting: Occupational Therapy

## 2020-01-15 ENCOUNTER — Inpatient Hospital Stay (HOSPITAL_COMMUNITY): Payer: Medicare HMO | Admitting: Speech Pathology

## 2020-01-15 ENCOUNTER — Inpatient Hospital Stay (HOSPITAL_COMMUNITY): Payer: Medicare HMO

## 2020-01-15 DIAGNOSIS — R0989 Other specified symptoms and signs involving the circulatory and respiratory systems: Secondary | ICD-10-CM | POA: Insufficient documentation

## 2020-01-15 DIAGNOSIS — N390 Urinary tract infection, site not specified: Secondary | ICD-10-CM

## 2020-01-15 LAB — URINE CULTURE: Culture: >=100000 — AB

## 2020-01-15 LAB — GLUCOSE, CAPILLARY
Glucose-Capillary: 168 mg/dL — ABNORMAL HIGH (ref 70–99)
Glucose-Capillary: 199 mg/dL — ABNORMAL HIGH (ref 70–99)
Glucose-Capillary: 184 mg/dL — ABNORMAL HIGH (ref 70–99)
Glucose-Capillary: 256 mg/dL — ABNORMAL HIGH (ref 70–99)

## 2020-01-15 MED ORDER — SODIUM CHLORIDE 0.9 % IV SOLN
2.0000 g | Freq: Two times a day (BID) | INTRAVENOUS | Status: DC
  Administered 2020-01-15: 2 g via INTRAVENOUS
  Filled 2020-01-15 (×2): qty 2

## 2020-01-15 MED ORDER — SODIUM CHLORIDE 0.9 % IV SOLN
2.0000 g | Freq: Two times a day (BID) | INTRAVENOUS | Status: DC
  Administered 2020-01-15 – 2020-01-17 (×4): 2 g via INTRAVENOUS
  Filled 2020-01-15 (×5): qty 2

## 2020-01-15 NOTE — Patient Care Conference (Signed)
Inpatient RehabilitationTeam Conference and Plan of Care Update Date: 01/15/2020   Time: 11:40 AM    Patient Name: Ryan Rice      Medical Record Number: [id number]  Date of Birth: Jul 12, 1951 Sex: Male         Room/Bed: 4M05C/4M05C-01 Payor Info: Payor: [employer] MEDICARE / Plan: [place] HMO / Product Type: *No Product type* /    Admit Date/Time:  01/10/2020  2:51 PM  Primary Diagnosis:  Embolic infarction [facility])  Patient Active Problem List   Diagnosis Date Noted  . Labile blood pressure   . Acute lower UTI   . Leukocytosis   . Thrombocytopenia ([place])   . Hyponatremia   . Steroid-induced hyperglycemia   . Ischemic stroke of frontal lobe ([place]) 01/10/2020  . Embolic infarction ([place]) 01/10/2020  . Controlled type 2 diabetes mellitus with hyperglycemia, without long-term current use of insulin ([place])   . Renal transplant, status post   . Essential hypertension   . Cerebral embolism with cerebral infarction 01/03/2020  . Brain tumor ([place]) 12/30/2019  . Hip pain, bilateral 12/30/2019  . Elevated PSA 12/30/2019  . Atrial fibrillation, chronic ([place]) 12/30/2019  . Hypomagnesemia 03/20/2019  . Diabetes ([place]) 03/15/2019  . Glaucoma 03/15/2019  . Scleral hemorrhage of left eye 03/15/2019  . Gout flare 03/15/2019  . Debility 03/08/2019  . AKI (acute kidney injury) ([place])   . CAP (community acquired pneumonia)   . Kidney transplant recipient   . Sepsis ([place]) 02/27/2019    Expected Discharge Date: Expected Discharge Date: 01/25/20  Team Members Present: Physician leading conference: Dr. Delice Lesch Care Coodinator Present: Nestor Lewandowsky, RN, BSN, CRRN;Christina Sampson Goon, BSW;Genie Malayla Granberry, RN, MSN Nurse Present: Other (comment)(Amanda Archie, RN) PT Present: Becky Sax, PT OT Present: [name], OT SLP Present: Colon Flattery, SLP PPS Coordinator present : Ileana Ladd, PT     Current Status/Progress Goal Weekly Team Focus  Bowel/Bladder   Continent and incontinent  of B/B  To help become more continent  To ask pt if he needs to void or have a BM often or as needed.   Swallow/Nutrition/ Hydration             ADL's   Min assist bathing and dressing with up to Mod assist for LB dressing, Min assist transfers,  Supervision overall, Min A cognition  ADL retraining, activity tolerance, standing balance   Mobility   transfers min A with RW, ambulation 70ft with RW min A, min/mod A with stairs with rails  supervision  functional mobility/transfers, LE strength, dynamic standing balance/coordination, ambulation, stair navigation, endurance   Communication   Mod A - expressive > receptive aphasia, perseveration  Mod A  yes/no accuracy, following directions, naming tasks   Safety/Cognition/ Behavioral Observations            Pain   No complaints of pain this shift  To remain pain free  Assess pain q shift or as needed.   Skin   Has abrasion like area to scrotum  Use barrier cream to promote healing and prevent further breakdown.  Assess skin q shift or as needed.    Rehab Goals Patient on target to meet rehab goals: Yes Rehab Goals Revised: Patient on point for goals *See Care Plan and progress notes for long and short-term goals.     Barriers to Discharge  Current Status/Progress Possible Resolutions Date Resolved   Nursing                  PT  Incontinence;Decreased caregiver support  unclear what support is available at home              OT                  SLP                SW     No barriers, patient on track for discharge          Discharge Planning/Teaching Needs:  Goal to discharge home  Will schedule education if therapy reccommends   Team Discussion: MD aphasia improving, plateletts improving, Na+ mildly low, moritoring labs, CBGs up, on steroids, BP up/down, UTI, abx started today.  RN cont/inc, BM 4/19, BS 168, meds whole, IV abx.  OT min transfers, bathing and dressing, toileting max/total, inc bladder, loose stool, S goals.  PT  transfers min A, amb 80' min A RW, cues for safety, S goals, has wife.  SLP needs some S to eat.   Revisions to Treatment Plan: N/A     Medical Summary Current Status: Deficits with mobility, transfers, cognition/aphasia secondary to left MCA/ACA embolic infarcts. Weekly Focus/Goal: Improve mobility, BP, CKD, hyponatremia, thrombocytopenia, UTI, DM  Barriers to Discharge: IV antibiotics;Medical stability   Possible Resolutions to Barriers: Therapies, follow labs, abx for UTI, monitor CBGs with steroid taper   Continued Need for Acute Rehabilitation Level of Care: The patient requires daily medical management by a physician with specialized training in physical medicine and rehabilitation for the following reasons: Direction of a multidisciplinary physical rehabilitation program to maximize functional independence : Yes Medical management of patient stability for increased activity during participation in an intensive rehabilitation regime.: Yes Analysis of laboratory values and/or radiology reports with any subsequent need for medication adjustment and/or medical intervention. : Yes   I attest that I was present, lead the team conference, and concur with the assessment and plan of the team.   Jodell Cipro M 01/15/2020, 2:35 PM   Team conference was held via web/ teleconference due to [place] - 19

## 2020-01-15 NOTE — Progress Notes (Signed)
Physical Therapy Session Note  Patient Details  Name: Ryan Rice MRN: [id number] Date of Birth: 11-01-1950  Today's Date: 01/15/2020 PT Individual Time: 1300-1413 PT Individual Time Calculation (min): 73 min   Short Term Goals: Week 1:  PT Short Term Goal 1 (Week 1): Pt will be able to perform transfers with CGA PT Short Term Goal 2 (Week 1): Pt will be able to demonstrate functional dynamic standing balane with CGA PT Short Term Goal 3 (Week 1): Pt will be able to gait x 100' wiht min assist  Skilled Therapeutic Interventions/Progress Updates:   Received pt sitting in WC finishing lunch, pt agreeable to therapy, and denied any pain during session. Session focused on functional mobility/transfers, LE strength, dynamic standing balance/coordination, ambulation, and improved activity tolerance. Pt performed WC mobility 168ft alternating between using bilateral LEs and bilateral UEs with supervision and verbal and tactile cues for propulsion technique. Pt transferred stand<>pivot WC<>mat without AD min A. Pt ambulated 60ft x 2 trials with RW min/mod A. Pt required manual facilitation to keep RW close by and verbal cues for RW safety, upright posture, and following directions with ambulation. Pt attempting to go straight instead of turning and tried to sit in another pt's WC; requiring max cues for redirection. Therapist asked pt to locate his WC and point to it, however pt unable and continued to state "white". During trial 2 of ambulation, upon returning to mat, pt pushed walker out in front and failed to turn all the way around prior to sitting resulting in LOB sideways onto mat. Therapist educated pt extensively on turning technique and RW safety; however pt with poor carry over. Worked on dynamic standing balance playing tic tac toe standing with min A x 2 trials. Pt with increased trunk flexion leaning on tray table and required max cues to stand upright. Pt played "O's" and therapist played  "X's"; pt required cues to mark "O's" instead of "X's" throughout game. Pt transferred sit<>stand min A without AD and practiced identifying colors on clothespins and clipping them to basketball net with min A x 1 trial without AD. Pt leaning on tray table and leaning on basketball hoop with trunk flexed, requiring max verbal cues to stand upright; however pt with poor carry over and continued to lean forward. Pt able to correctly identify color 3/6 trials with increased difficulty locating yellow clothespins. Pt performed activity x 4 additional trials with RW CGA. Pt with decreased trunk flexion relying on RW for UE support. Pt with continued difficulty locating yellow clothespin. Pt spontaneously siting down mid-activity and when asked if pt was tired he responded with "yes". Pt required max verbal cues to remain standing during activity. Pt required multiple seated rest breaks throughout session due to increased fatigue. Pt transferred sit<>stand and performed alternating toe taps to 6in step x 20 reps with UE support on RW min A. Pt required verbal cues for eccentric control of LE when taking them off step and for upright posture. Pt transferred stand<>pivot mat<>WC without AD to R min A. Pt transported back to room in [facility] total assist. Pt requested to lie down and transferred stand<>pivot WC<>bed without AD min A and sit<>supine with min A for LE management. Concluded session with pt supine in bed, needs within reach, and bed alarm on.  Therapy Documentation Precautions:  Precautions Precautions: Fall Precaution Comments: language deficits; apraxia Restrictions Weight Bearing Restrictions: No  Therapy/Group: Individual Therapy Alfonse Alpers PT, DPT   01/15/2020, 7:39 AM

## 2020-01-15 NOTE — Progress Notes (Signed)
  Pharmacy Antibiotic Note  Ryan Rice is a 69 y.o. male admitted on 01/10/2020 with pseudomonas UTI.  Pharmacy has been consulted for cefepime dosing.   Patient is on immunosuppressants for hx of kidney transplant. WBC 15. Afebrile. Urine culture positive for >100k pseudomonas.  Plan: Cefepime 2g Q12hr  F/u LOT, clinical status  Height: 5\' 11"  (180.3 cm) Weight: 85.6 kg (188 lb 11.4 oz) IBW/kg (Calculated) : 75.3  Temp (24hrs), Avg:97.9 F (36.6 C), Min:97.6 F (36.4 C), Max:98.1 F (36.7 C)  Recent Labs  Lab 01/09/20 0348 01/10/20 0432 01/11/20 0802 01/12/20 0505 01/13/20 0532 01/14/20 0537  WBC  --   --  26.4* 21.4* 16.2* 15.6*  CREATININE 1.33* 1.69* 1.80* 1.52* 1.24  --   LATICACIDVEN  --   --   --  1.1  --   --     Estimated Creatinine Clearance: 59.9 mL/min (by C-G formula based on SCr of 1.24 mg/dL).    Antimicrobials this admission: Cefepime 4/21 >>   Microbiology results: 4/18 UCx:>100k Pseudomonas S cefe  Thank you for allowing pharmacy to be a part of this patient's care.  Benetta Spar, PharmD, BCPS, BCCP Clinical Pharmacist  Please check AMION for all [facility] phone numbers After 10:00 PM, call [place] [phone]

## 2020-01-15 NOTE — Progress Notes (Signed)
Occupational Therapy Session Note  Patient Details  Name: Ryan Rice MRN: [id number] Date of Birth: 1951-08-27  Today's Date: 01/15/2020 OT Individual Time: [id number] OT Individual Time Calculation (min): 49 min    Short Term Goals: No short term goals set  Skilled Therapeutic Interventions/Progress Updates:    Treatment session with focus on self-care retraining.  Pt received upright in bed with breakfast tray still in front of pt.  Initially pt stating that he was still eating, however did not touch food despite encouragement.  Pt agreeable to getting dressed and then finishing breakfast if still hungry.  Completed bed mobility CGA and stand pivot transfer to w/c.  Noted pt to have been incontinent of bladder, therefore asked pt if he needed to toilet.  Completed stand pivot transfer CGA w/c > BSC over toilet and assisted for removal of incontinence brief.  Pt continent of bowel (loose) and bladder on toilet with increased time.  Engaged in bathing/dressing at sit > stand level at sink with cues for initiation and sequencing with bathing.  Pt able to complete dressing with setup assist, except requiring assist with donning and fastening shoes due to fatigue.  Pt remained upright in w/c with seat belt alarm on and all needs in reach.  Therapy Documentation Precautions:  Precautions Precautions: Fall Precaution Comments: language deficits; apraxia Restrictions Weight Bearing Restrictions: No General:   Vital Signs: Therapy Vitals Pulse Rate: 96 BP: (!) 146/83 Pain:  Pt with no c/o pain   Therapy/Group: Individual Therapy  [name] 01/15/2020, 11:40 AM

## 2020-01-15 NOTE — Progress Notes (Addendum)
[place] PHYSICAL MEDICINE & REHABILITATION PROGRESS NOTE   Subjective/Complaints: Patient seen lying in bed this morning.  He states he slept well overnight.  He is attempting to tolerate something, but unable to intelligibly articulate.  ROS: Denies CP, SOB, N/V/D  Objective:   No results found. Recent Labs    01/13/20 0532 01/14/20 0537  WBC 16.2* 15.6*  HGB 12.6* 13.3  HCT 38.2* 41.2  PLT 90* 112*   Recent Labs    01/13/20 0532  NA 132*  K 3.9  CL 107  CO2 15*  GLUCOSE 170*  BUN 33*  CREATININE 1.24  CALCIUM 8.3*    Intake/Output Summary (Last 24 hours) at 01/15/2020 0811 Last data filed at 01/14/2020 1300 Gross per 24 hour  Intake 500 ml  Output --  Net 500 ml     Physical Exam: Vital Signs Blood pressure (!) 148/69, pulse 69, temperature 98 F (36.7 C), temperature source Oral, resp. rate 16, height 5\' 11"  (1.803 m), weight 85.6 kg, SpO2 99 %. Constitutional: No distress . Vital signs reviewed. HENT: Normocephalic.  Atraumatic. Eyes: EOMI. No discharge. Cardiovascular: No JVD. Respiratory: Normal effort.  No stridor. GI: Non-distended. Skin: Warm and dry.  Intact. Psych: Normal mood.  Normal behavior. Musc: No edema in extremities.  No tenderness in extremities. Neuro: Alert Expressive >>receptive aphasia, improving Motor: RUE/RLE: 5/5 proximal distal LUE/LLE: 4+/5 proximal to distal  Assessment/Plan: 1. Functional deficits secondary to L ACA, MCA embolic infracts which require 3+ hours per day of interdisciplinary therapy in a comprehensive inpatient rehab setting.  Physiatrist is providing close team supervision and 24 hour management of active medical problems listed below.  Physiatrist and rehab team continue to assess barriers to discharge/monitor patient progress toward functional and medical goals  Care Tool:  Bathing    Body parts bathed by patient: Right arm, Left arm, Chest, Abdomen, Front perineal area, Buttocks, Right upper leg,  Left upper leg, Right lower leg, Left lower leg, Face   Body parts bathed by helper: Front perineal area, Buttocks(due to large incontience)     Bathing assist Assist Level: Minimal Assistance - Patient > 75%     Upper Body Dressing/Undressing Upper body dressing   What is the patient wearing?: Pull over shirt    Upper body assist Assist Level: Supervision/Verbal cueing    Lower Body Dressing/Undressing Lower body dressing      What is the patient wearing?: Incontinence brief, Pants     Lower body assist Assist for lower body dressing: Moderate Assistance - Patient 50 - 74%     Toileting Toileting    Toileting assist Assist for toileting: Maximal Assistance - Patient 25 - 49%     Transfers Chair/bed transfer  Transfers assist     Chair/bed transfer assist level: Moderate Assistance - Patient 50 - 74%     Locomotion Ambulation   Ambulation assist      Assist level: Minimal Assistance - Patient > 75% Assistive device: Walker-rolling Max distance: 132ft   Walk 10 feet activity   Assist     Assist level: Minimal Assistance - Patient > 75% Assistive device: Walker-rolling   Walk 50 feet activity   Assist Walk 50 feet with 2 turns activity did not occur: Safety/medical concerns  Assist level: Minimal Assistance - Patient > 75% Assistive device: Walker-rolling    Walk 150 feet activity   Assist Walk 150 feet activity did not occur: Safety/medical concerns         Walk 10 feet  on uneven surface  activity   Assist Walk 10 feet on uneven surfaces activity did not occur: Safety/medical concerns         Wheelchair     Assist   Type of Wheelchair: Manual    Wheelchair assist level: Contact Guard/Touching assist Max wheelchair distance: 150'    Wheelchair 50 feet with 2 turns activity    Assist        Assist Level: Contact Guard/Touching assist   Wheelchair 150 feet activity     Assist      Assist Level:  Contact Guard/Touching assist   Blood pressure (!) 148/69, pulse 69, temperature 98 F (36.7 C), temperature source Oral, resp. rate 16, height 5\' 11"  (1.803 m), weight 85.6 kg, SpO2 99 %.  Medical Problem List and Plan: 1.  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  Continue CIR  Team conference today to discuss current and goals and coordination of care, home and environmental barriers, and discharge planning with nursing, case manager, and therapies.  2.  Antithrombotics: -DVT/anticoagulation:  Pharmaceutical: Other (comment)--Eliquis             -antiplatelet therapy: N/A 3. Pain Management: N/A 4. Mood: LCSW to follow for evaluation and support when appropriate.              -antipsychotic agents: N/A 5. Neuropsych: This patient is not capable of making decisions on his own behalf. 6. Skin/Wound Care: Routine pressure relief measures.  7. Fluids/Electrolytes/Nutrition: Monitor I/Os.  8.  HTN: Monitor tid--Labetalol was d/c due to pauses--now on metoprolol, hydralazine and Nifedipine   Labile on 4/21, monitor for trend             Monitor with increased mobility 9.  CKD s/p renal transplant: Continues to have upward trend BUN and creatinine despite IV fluids for hydration. On prednisone taper to 10 mg daily. Sepra DS MWF,Prograf and Myfortic.              Creatinine 1.24 on 4/19, will order labs for later this week  Continue to monitor 10. Steroid-induced hyperglycemia on T2DM with hyperglycemia: Hgb A1c- 7.1.  Was on metformin, Starlix and Januvia PTA. Monitor blood sugars ac/hs. Continue Lantus 20 units daily and use SSI for elevated BS.    CBG (last 3)  Recent Labs    01/14/20 1643 01/14/20 2120 01/15/20 0618  GLUCAP 185* 190* 168*    Elevated, but stabilizing on 4/21, monitor with steroid taper             Monitor with increased mobility 11. Leukocytosis-likely secondary to steroids  WBC 15.6 on 4/20  Afebrile  UA  slightly borderline chest x-ray unremarkable for infection 12.  Hyponatremia  Sodium 132 on 4/19, plan to order labs for later this week  Continue to monitor 13.  Thrombocytopenia  Platelets 112 on 4/20  Continue to monitor. 14.  Pseudomonas UTI  Discussed with pharmacy, cefepime initiated on 4/21  LOS: 5 days A FACE TO FACE EVALUATION WAS PERFORMED  Ryan Rice 01/15/2020, 8:11 AM

## 2020-01-15 NOTE — Progress Notes (Signed)
Speech Language Pathology Daily Session Note  Patient Details  Name: Ryan Rice MRN: [id number] Date of Birth: 05/22/1951  Today's Date: 01/15/2020 SLP Individual Time: 1450-1505 SLP Individual Time Calculation (min): 15 min  Short Term Goals: Week 1: SLP Short Term Goal 1 (Week 1): Pt will name common objects in 8 out of 10 opportunities with Min A cues. SLP Short Term Goal 2 (Week 1): Pt will answer basic yes/no questions in 8 out of 10 opportunities with Min A cues. SLP Short Term Goal 3 (Week 1): Pt will use multimodal means to communicate basic wants and needs with Mod A cues. SLP Short Term Goal 4 (Week 1): Pt will follow 1 step-directions in 8 out of 10 opportunities with Min A cues. SLP Short Term Goal 5 (Week 1): Pt will complete basic problem solving tasks with Min A cues.  Skilled Therapeutic Interventions: Pt was seen for skilled ST intervention targeting assessment of verbal and oral apraxia. SLP facilitated session by administering portions of the Apraxia Battery for Adults. Pt did not exhibit increased errors with increased complexity, and did not exhibit numerous or varied attempts at word production. Pt was able to perform oral tasks without searching or inconsistent behavior. At the end of the session, pt response to a request was to say "mm", but not complete the task. He does not presents with the typical oral/verbal apraxic behaviors. Pt social exchanges continue to be intact - pt said "have a good one" as I left the room.  Pt was left in bed with alarm on, all needs within reach. Continue ST per current plan of care.   Pain Pain Assessment Pain Scale: 0-10 Pain Score: 0-No pain  Therapy/Group: Individual Therapy   Amanuel Sinkfield B. Quentin Ore, [facility], CCC-SLP Speech Language Pathologist  Shonna Chock 01/15/2020, 3:38 PM

## 2020-01-15 NOTE — Progress Notes (Signed)
Occupational Therapy Session Note  Patient Details  Name: Ryan Rice MRN: [id number] Date of Birth: 13-Sep-1951  Today's Date: 01/15/2020 OT Individual Time: [id number] OT Individual Time Calculation (min): 28 min    Short Term Goals: Week 1:   STGs=LTGs  Skilled Therapeutic Interventions/Progress Updates:    Upon entering the room, pt in bed with HOB elevated and eating breakfast. OT entered the room, and pt verbalized wishing to eat breakfast first before therapy. OT observed for several minutes and pt not taking any bites but holding spoon up and was obviously internally and externally distracted. Pt given choice of two for orientation questions and needing max cuing for orientation. When OT asking pt a question often he would report, "umm or hmm" but not answer. OT attempting to create quiet environment for pt to finish breakfast as therapist exited the room. Call bell and all needed items within reach. Bed alarm activated.   Therapy Documentation Precautions:  Precautions Precautions: Fall Precaution Comments: language deficits; apraxia Restrictions Weight Bearing Restrictions: No General:   Vital Signs: Therapy Vitals Temp: 98.1 F (36.7 C) Pulse Rate: 78 Resp: 16 BP: 109/62 Patient Position (if appropriate): Lying Oxygen Therapy SpO2: 97 % O2 Device: Room Air Pain: Pain Assessment Pain Scale: 0-10 Pain Score: 0-No pain ADL: ADL Where Assessed-Eating: Chair Grooming: Minimal assistance Upper Body Bathing: Setup Where Assessed-Upper Body Bathing: Sitting at sink Lower Body Bathing: Moderate assistance Where Assessed-Lower Body Bathing: Wheelchair, Sitting at sink Upper Body Dressing: Supervision/safety Lower Body Dressing: Moderate assistance Where Assessed-Lower Body Dressing: Sitting at sink Toileting: (total A due to incontience) Toilet Transfer: Minimal assistance Toilet Transfer Method: Stand pivot   Therapy/Group: Individual Therapy  Gypsy Decant 01/15/2020, 4:20 PM

## 2020-01-15 NOTE — Evaluation (Addendum)
Speech Language Pathology Assessment and Plan Bedside Swallow Evaluation  Patient Details  Name: Ryan Rice MRN: [id number] Date of Birth: [birth year]  SLP Diagnosis: dysphagia - unspecified Rehab Potential:  good  Today's Date: 01/15/2020 SLP Individual Time: 1230-1300 SLP Individual Time Calculation (min): 30 min   Problem List:  Patient Active Problem List   Diagnosis Date Noted  . Labile blood pressure   . Acute lower UTI   . Leukocytosis   . Thrombocytopenia ([place])   . Hyponatremia   . Steroid-induced hyperglycemia   . Ischemic stroke of frontal lobe ([place]) 01/10/2020  . Embolic infarction ([place]) 01/10/2020  . Controlled type 2 diabetes mellitus with hyperglycemia, without long-term current use of insulin ([place])   . Renal transplant, status post   . Essential hypertension   . Cerebral embolism with cerebral infarction 01/03/2020  . Brain tumor ([place]) 12/30/2019  . Hip pain, bilateral 12/30/2019  . Elevated PSA 12/30/2019  . Atrial fibrillation, chronic ([place]) 12/30/2019  . Hypomagnesemia 03/20/2019  . Diabetes ([place]) 03/15/2019  . Glaucoma 03/15/2019  . Scleral hemorrhage of left eye 03/15/2019  . Gout flare 03/15/2019  . Debility 03/08/2019  . AKI (acute kidney injury) ([place])   . CAP (community acquired pneumonia)   . Kidney transplant recipient   . Sepsis ([place]) 02/27/2019   Past Medical History:  Past Medical History:  Diagnosis Date  . Allergic rhinitis   . BPH (benign prostatic hyperplasia)   . Chronic atrial fibrillation ([place])   . Diabetes mellitus ([place])   . DVT (deep venous thrombosis) ([facility])    LLE  . Hematochezia   . HTN (hypertension)   . Hypomagnesemia 03/20/2019  . Kidney transplant recipient   . PVD (peripheral vascular disease) ([place])    Past Surgical History:  Past Surgical History:  Procedure Laterality Date  . KIDNEY TRANSPLANT      Assessment / Plan / Recommendation Clinical Impression Pt seen for clinical swallow evaluation after  concerns voiced during team conference. Pt was observed during lunch with regular texture solids and thin liquids. Pt benefitted from assistance with cutting up solid textures. Pt highly distractible during the meal, and benefitted from turning off TV and closing his door. No obvious oral issues were noted, and no overt s/s aspiration on any consistency. Good attention to the entire tray, without obvious neglect noted. Pt was fed 3 bites of lunch to encourage continued intake. He was able to verbalize "I'm full".   Recommend continuing regular solids and thin liquids. Safe swallow precautions posted, which include: intermittent supervision, minimize distractions, assist with cutting solids. No further ST intervention for dysphagia recommended.  Skilled Therapeutic Interventions          BSE  Recommendations  SLP Diet Recommendations: Age appropriate regular solids;Thin Liquid Administration via: Cup;Straw Medication Administration: Whole meds with liquid Supervision: Patient able to self feed;Intermittent supervision to cue for compensatory strategies Compensations: Slow rate;Small sips/bites;Minimize environmental distractions Postural Changes and/or Swallow Maneuvers: Seated upright 90 degrees Oral Care Recommendations: Oral care BID Patient destination: Home Equipment Recommended: None recommended by SLP     Pain Pain Assessment Pain Scale: 0-10 Pain Score: 0-No pain  Bedside Swallowing Assessment General Date of Onset: 01/10/20 Previous Swallow Assessment: BSE 03/03/2019 Diet Prior to this Study: Thin liquids;Regular Temperature Spikes Noted: No Respiratory Status: Room air History of Recent Intubation: No Behavior/Cognition: Alert;Cooperative Oral Cavity - Dentition: Adequate natural dentition Self-Feeding Abilities: Able to feed self;Needs set up Vision: Functional for self-feeding Patient Positioning: Upright in bed Baseline  Vocal Quality: Normal Volitional Cough:  Strong Volitional Swallow: Able to elicit  Oral Care Assessment Does patient have any of the following "high(er) risk" factors?: None of the above Does patient have any of the following "at risk" factors?: None of the above Patient is LOW RISK: Follow universal precautions (see row information) Ice Chips Ice chips: Not tested Thin Liquid Presentation: Straw;Self Fed Nectar Thick Nectar Thick Liquid: Not tested Honey Thick Honey Thick Liquid: Not tested Puree Puree: Within functional limits Presentation: Self Fed Solid Solid: Within functional limits Presentation: Self Fed Other Comments: assisted with cutting up solid items BSE Assessment Risk for Aspiration Impact on safety and function: No limitations Other Related Risk Factors: Cognitive impairment  Short Term Goals: N/A for swallowing   The above assessment, treatment plan, treatment alternatives and goals were discussed and mutually agreed upon: by patient  Rosendo Couser B. Quentin Ore, [facility], CCC-SLP Speech Language Pathologist  Shonna Chock 01/15/2020, 3:32 PM

## 2020-01-15 NOTE — Progress Notes (Signed)
Social Work Patient ID: Ryan Rice, male   DOB: [birth year], 69 y.o.   MRN: [id number]  Sw informed patient, spouse and daughter in law of team conference updates. Inform patient and family of MD, nursing and therapy updates. Family requesting orders for Home Care. Sw informed family that therapy would make these recommendations, and sw will set up [facility] for patient and recommendations from therapy will be known closer to discharge..  Would also like PT to follow up about hip pains patient reporting having before being admitted. Informed family and patient of discharge date of May 1st and they are pleased. Will follow up with family questions/concerns.

## 2020-01-16 ENCOUNTER — Inpatient Hospital Stay (HOSPITAL_COMMUNITY): Payer: Medicare HMO | Admitting: Speech Pathology

## 2020-01-16 ENCOUNTER — Inpatient Hospital Stay (HOSPITAL_COMMUNITY): Payer: Medicare HMO | Admitting: Occupational Therapy

## 2020-01-16 ENCOUNTER — Inpatient Hospital Stay (HOSPITAL_COMMUNITY): Payer: Medicare HMO

## 2020-01-16 ENCOUNTER — Inpatient Hospital Stay (HOSPITAL_COMMUNITY): Payer: Medicare HMO | Admitting: *Deleted

## 2020-01-16 ENCOUNTER — Inpatient Hospital Stay (HOSPITAL_COMMUNITY): Payer: Medicare HMO | Admitting: Physical Therapy

## 2020-01-16 DIAGNOSIS — D72829 Elevated white blood cell count, unspecified: Secondary | ICD-10-CM

## 2020-01-16 LAB — GLUCOSE, CAPILLARY
Glucose-Capillary: 209 mg/dL — ABNORMAL HIGH (ref 70–99)
Glucose-Capillary: 212 mg/dL — ABNORMAL HIGH (ref 70–99)
Glucose-Capillary: 247 mg/dL — ABNORMAL HIGH (ref 70–99)
Glucose-Capillary: 283 mg/dL — ABNORMAL HIGH (ref 70–99)

## 2020-01-16 NOTE — Progress Notes (Signed)
Occupational Therapy Session Note  Patient Details  Name: Ryan Rice MRN: [id number] Date of Birth: 1951/04/28  Today's Date: 01/16/2020 OT Individual Time: 1300-1358 OT Individual Time Calculation (min): 58 min   Short Term Goals: Week 1:     Skilled Therapeutic Interventions/Progress Updates:    Pt greeted seated in wc after finishing lunch and agreeable to OT treatment session. Pt declined BADL tasks this afternoon. Pt brought down to therapy gym in wc. UB there-ex on [employer] arm bike on level 3. 2 sets of 5 minutes with rest break in between. B UE coordination with core strengthening using seated ball toss with abdominal twist - 3 sets of 20.  Worked on simple peg board puzzle in standing with 3 color pattern. Pt with a lot of difficulty just naming the colors. OT asked if pt was color blind, and he said he was, but could see red and green. OT graded activity and made 2 color pattern out of red and green and made sure he could tell the difference between the red and green pegs. Pt was still unable to copy simple 2 color pattern despite giving max cues and using backwards chaining. Pt was also in distracting environment which may have been contributing to confusion. When asked what color was next, pt often stating "yellow." Pt returned to room at end of session and left seated in wc with alarm belt on, call bell in reach, and needs met.   Therapy Documentation Precautions:  Precautions Precautions: Fall Precaution Comments: language deficits; apraxia Restrictions Weight Bearing Restrictions: No Pain: Pain Assessment Pain Scale: 0-10 Pain Score: 0-No pain   Therapy/Group: Individual Therapy  Valma Cava 01/16/2020, 1:14 PM

## 2020-01-16 NOTE — Progress Notes (Signed)
Recreational Therapy Session Note  Patient Details  Name: Ryan Rice MRN: [id number] Date of Birth: Aug 21, 1951 Today's Date: 01/16/2020 Time:  8:35 Pain: no c/o Skilled Therapeutic Interventions/Progress Updates: Upon entering, pt was seated attempting to eat fruit cup from his breakfast tray with the top attached.  LRT  removed top and provided set up assistance for fruit consumption.  TR eval incomplete at this time as pt was working on his breakfast and easily distracted.  Will follow up at a later date. [place] 01/16/2020, 12:29 PM

## 2020-01-16 NOTE — Progress Notes (Signed)
Physical Therapy Session Note  Patient Details  Name: Ryan Rice MRN: [id number] Date of Birth: [birth year]  Today's Date: 01/16/2020 PT Individual Time: 2703-5009 PT Individual Time Calculation (min): 42 min   Short Term Goals: Week 1:  PT Short Term Goal 1 (Week 1): Pt will be able to perform transfers with CGA PT Short Term Goal 2 (Week 1): Pt will be able to demonstrate functional dynamic standing balane with CGA PT Short Term Goal 3 (Week 1): Pt will be able to gait x 100' wiht min assist  Skilled Therapeutic Interventions/Progress Updates:   Pt received sitting in WC and agreeable to PT. Pt transported to [employer] in [facility] Stand pivot transfer with min assist and moderate cues to push from sitting surface and then BUE support on RW. PT instructed p tin dynamic standing balance to perform cross body then lateral reaches to place and remove cloth pins from basketball net. Seated trunkal NMR to improved postureal control muscles raise ball overhead then peforrm diagonals. Raise/lowers x 8 Bil with min assist for erect posture and improved thoracic/abominal rotation. Gait training with RW x 134f and min assist for improve pelvic rotation and reduced flexed posture. Forward/reverse gait training with RW 2 x 10 ft with min-mod assist for safety and min cues for gait pattern and AD management. [facility] returned to [facility] and [facility] reach and all needs met.          Therapy Documentation Precautions:  Precautions Precautions: Fall Precaution Comments: language deficits; apraxia Restrictions Weight Bearing Restrictions: No Vital Signs: Therapy Vitals Temp: 98.3 F (36.8 C) Temp Source: Oral Pulse Rate: 83 Resp: 17 BP: 123/68 Patient Position (if appropriate): Sitting Oxygen Therapy SpO2: 100 % O2 Device: Room Air Pain: denies   Therapy/Group: Individual Therapy  ALorie Phenix4/22/2021, 5:37 PM

## 2020-01-16 NOTE — Progress Notes (Signed)
Physical Therapy Session Note  Patient Details  Name: Ryan Rice MRN: [id number] Date of Birth: [birth year]  Today's Date: 01/16/2020 PT Individual Time: 0901-0956 PT Individual Time Calculation (min): 55 min   Short Term Goals: Week 1:  PT Short Term Goal 1 (Week 1): Pt will be able to perform transfers with CGA PT Short Term Goal 2 (Week 1): Pt will be able to demonstrate functional dynamic standing balane with CGA PT Short Term Goal 3 (Week 1): Pt will be able to gait x 100' wiht min assist  Skilled Therapeutic Interventions/Progress Updates:   Received pt supine in bed, pt agreeable to therapy, and denied any pain during today's session. When therapist pulled back sheets, pt sitting in pile of liquid and extremely loose stool that soiled bedsheets and all of pt's clothing. RN made aware. Pt rolled to L and R with min A and use of bedrails to doff soiled brief and required total assist and extensive time for peri-care and clean up. Donned clean incontinence brief in supine with max A. Pt transferred supine<>sitting EOB with CGA. Doffed soiled shirt and donned clean one with min A. Donned shoes with max A and pants sitting EOB with mod A and pt required max A to pull pants over hips in standing with bilateral UE support on RW. Pt transferred stand<>pivot bed<>WC with RW min A and verbal cues for RW safety and turning technique. Session focused on toileting, hygiene, dressing, functional mobility/transfers, LE strength, dynamic standing balance/coordination, ambulation, and improved activity tolerance. Pt transported to therapy gym in [facility] total assist for time management purposes. Pt ambulated 60ft with RW min A with manual facilitation from therapist to keep RW close by. Pt performed the following exercises standing with bilateral UE support on RW and verbal cues: -mini squats 2x8 with min A and tactile and verbal cues for technique -alternating marching 2x10 CGA/min A -heel raises x10 Pt  transported back to room in [facility] total assist. Concluded session with pt sitting in WC, needs within reach, and seatbelt alarm on.   Therapy Documentation Precautions:  Precautions Precautions: Fall Precaution Comments: language deficits; apraxia Restrictions Weight Bearing Restrictions: No  Therapy/Group: Individual Therapy Alfonse Alpers PT, DPT   01/16/2020, 7:24 AM

## 2020-01-16 NOTE — Progress Notes (Signed)
Speech Language Pathology Daily Session Note  Patient Details  Name: Cassey Bateson MRN: [id number] Date of Birth: [birth year]  Today's Date: 01/16/2020 SLP Individual Time: 1135-1200 SLP Individual Time Calculation (min): 25 min  Short Term Goals: Week 1: SLP Short Term Goal 1 (Week 1): Pt will name common objects in 8 out of 10 opportunities with Min A cues. SLP Short Term Goal 2 (Week 1): Pt will answer basic yes/no questions in 8 out of 10 opportunities with Min A cues. SLP Short Term Goal 3 (Week 1): Pt will use multimodal means to communicate basic wants and needs with Mod A cues. SLP Short Term Goal 4 (Week 1): Pt will follow 1 step-directions in 8 out of 10 opportunities with Min A cues. SLP Short Term Goal 5 (Week 1): Pt will complete basic problem solving tasks with Min A cues.  Skilled Therapeutic Interventions: Pt was seen for skilled ST targeting communication goals. Pt named objects within pictures with 75% accuracy provided Moderate semantic and phonemic cues. In order to extend to phrase level expressions, pt required written cues to correct phonemic paraphasias. Pt left sitting in wheelchair with alarm set and needs within reach, RN present. Continue per current plan of care.        Pain Pain Assessment Pain Scale: 0-10 Pain Score: 0-No pain  Therapy/Group: Individual Therapy  Arbutus Leas 01/16/2020, 7:15 AM

## 2020-01-16 NOTE — Progress Notes (Signed)
PHYSICAL MEDICINE & REHABILITATION PROGRESS NOTE   Subjective/Complaints: Patient seen sitting up in bed this morning.  He states he slept well overnight.  He is attempting to communicate something, but nonsensical words.  ROS: Denies CP, SOB, N/V/D  Objective:   No results found. Recent Labs    01/14/20 0537  WBC 15.6*  HGB 13.3  HCT 41.2  PLT 112*   No results for input(s): NA, K, CL, CO2, GLUCOSE, BUN, CREATININE, CALCIUM in the last 72 hours.  Intake/Output Summary (Last 24 hours) at 01/16/2020 0920 Last data filed at 01/16/2020 0756 Gross per 24 hour  Intake 1240 ml  Output --  Net 1240 ml     Physical Exam: Vital Signs Blood pressure (!) 121/56, pulse 69, temperature 98.8 F (37.1 C), temperature source Oral, resp. rate 18, height 5\' 11"  (1.803 m), weight 85.6 kg, SpO2 100 %. Constitutional: No distress . Vital signs reviewed. HENT: Normocephalic.  Atraumatic. Eyes: EOMI. No discharge. Cardiovascular: No JVD. Respiratory: Normal effort.  No stridor. GI: Non-distended. Skin: Warm and dry.  Intact. Psych: Normal mood.  Normal behavior. Musc: No edema in extremities.  No tenderness in extremities. 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 Functional deficits secondary to L ACA, MCA embolic infracts which require 3+ hours per day of interdisciplinary therapy in a comprehensive inpatient rehab setting.  Physiatrist is providing close team supervision and 24 hour management of active medical problems listed below.  Physiatrist and rehab team continue to assess barriers to discharge/monitor patient progress toward functional and medical goals  Care Tool:  Bathing    Body parts bathed by patient: Right arm, Left arm, Chest, Abdomen, Front perineal area, Right upper leg, Left upper leg, Right lower leg, Left lower leg, Face   Body parts  bathed by helper: Buttocks     Bathing assist Assist Level: Minimal Assistance - Patient > 75%     Upper Body Dressing/Undressing Upper body dressing   What is the patient wearing?: Pull over shirt    Upper body assist Assist Level: Supervision/Verbal cueing    Lower Body Dressing/Undressing Lower body dressing      What is the patient wearing?: Incontinence brief, Pants     Lower body assist Assist for lower body dressing: Minimal Assistance - Patient > 75%     Toileting Toileting    Toileting assist Assist for toileting: Maximal Assistance - Patient 25 - 49%     Transfers Chair/bed transfer  Transfers assist     Chair/bed transfer assist level: Minimal Assistance - Patient > 75%     Locomotion Ambulation   Ambulation assist      Assist level: Minimal Assistance - Patient > 75% Assistive device: Walker-rolling Max distance: 42ft   Walk 10 feet activity   Assist     Assist level: Minimal Assistance - Patient > 75% Assistive device: Walker-rolling   Walk 50 feet activity   Assist Walk 50 feet with 2 turns activity did not occur: Safety/medical concerns  Assist level: Minimal Assistance - Patient > 75% Assistive device: Walker-rolling    Walk 150 feet activity   Assist Walk 150 feet activity did not occur: Safety/medical concerns         Walk 10 feet on uneven surface  activity   Assist Walk 10 feet on uneven surfaces activity did not occur: Safety/medical concerns         Wheelchair  Assist   Type of Wheelchair: Manual    Wheelchair assist level: Supervision/Verbal cueing Max wheelchair distance: 154ft    Wheelchair 50 feet with 2 turns activity    Assist        Assist Level: Supervision/Verbal cueing   Wheelchair 150 feet activity     Assist      Assist Level: Contact Guard/Touching assist   Blood pressure (!) 121/56, pulse 69, temperature 98.8 F (37.1 C), temperature source Oral, resp. rate  18, height 5\' 11"  (1.803 m), weight 85.6 kg, SpO2 100 %.  Medical Problem List and Plan: 1.  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  Continue CIR 2.  Antithrombotics: -DVT/anticoagulation:  Pharmaceutical: Other (comment)--Eliquis             -antiplatelet therapy: N/A 3. Pain Management: N/A 4. Mood: LCSW to follow for evaluation and support when appropriate.              -antipsychotic agents: N/A 5. Neuropsych: This patient is not capable of making decisions on his own behalf. 6. Skin/Wound Care: Routine pressure relief measures.  7. Fluids/Electrolytes/Nutrition: Monitor I/Os.  8.  HTN: Monitor tid--Labetalol was d/c due to pauses--now on metoprolol, hydralazine and Nifedipine   Labile on 4/22, monitor for trend             Monitor with increased mobility 9.  CKD s/p renal transplant: Continues to have upward trend BUN and creatinine despite IV fluids for hydration. On prednisone taper to 10 mg daily. Sepra DS MWF,Prograf and Myfortic.              Creatinine 1.24 on 4/19, will order labs for later this week  Continue to monitor 10. Steroid-induced hyperglycemia on T2DM with hyperglycemia: Hgb A1c- 7.1.  Was on metformin, Starlix and Januvia PTA. Monitor blood sugars ac/hs. Continue Lantus 20 units daily and use SSI for elevated BS.    CBG (last 3)  Recent Labs    01/15/20 1626 01/15/20 2114 01/16/20 0625  GLUCAP 184* 256* 209*    Remains elevated and?  Trending up on 4/22, monitor with steroid taper             Monitor with increased mobility 11. Leukocytosis-likely secondary to steroids  WBC 15.6 on 4/20  Afebrile  UA slightly borderline chest x-ray unremarkable for infection 12.  Hyponatremia  Sodium 132 on 4/19, labs ordered for tomorrow  Continue to monitor 13.  Thrombocytopenia  Platelets 112 on 4/20  Continue to monitor. 14.  Pseudomonas UTI  Discussed with pharmacy, cefepime initiated on 4/21  LOS: 6  days A FACE TO FACE EVALUATION WAS PERFORMED  Henrik Orihuela Lorie Phenix 01/16/2020, 9:20 AM

## 2020-01-17 ENCOUNTER — Inpatient Hospital Stay (HOSPITAL_COMMUNITY): Payer: Medicare HMO | Admitting: Occupational Therapy

## 2020-01-17 ENCOUNTER — Inpatient Hospital Stay (HOSPITAL_COMMUNITY): Payer: Medicare HMO | Admitting: Speech Pathology

## 2020-01-17 ENCOUNTER — Inpatient Hospital Stay (HOSPITAL_COMMUNITY): Payer: Medicare HMO | Admitting: Physical Therapy

## 2020-01-17 LAB — GLUCOSE, CAPILLARY
Glucose-Capillary: 196 mg/dL — ABNORMAL HIGH (ref 70–99)
Glucose-Capillary: 227 mg/dL — ABNORMAL HIGH (ref 70–99)
Glucose-Capillary: 271 mg/dL — ABNORMAL HIGH (ref 70–99)
Glucose-Capillary: 233 mg/dL — ABNORMAL HIGH (ref 70–99)

## 2020-01-17 LAB — BASIC METABOLIC PANEL
Anion gap: 8 (ref 5–15)
BUN: 41 mg/dL — ABNORMAL HIGH (ref 8–23)
CO2: 17 mmol/L — ABNORMAL LOW (ref 22–32)
Calcium: 8.2 mg/dL — ABNORMAL LOW (ref 8.9–10.3)
Chloride: 107 mmol/L (ref 98–111)
Creatinine, Ser: 1.44 mg/dL — ABNORMAL HIGH (ref 0.61–1.24)
GFR calc Af Amer: 57 mL/min — ABNORMAL LOW (ref >60)
GFR calc non Af Amer: 49 mL/min — ABNORMAL LOW (ref >60)
Glucose, Bld: 222 mg/dL — ABNORMAL HIGH (ref 70–99)
Potassium: 4.2 mmol/L (ref 3.5–5.1)
Sodium: 132 mmol/L — ABNORMAL LOW (ref 135–145)

## 2020-01-17 MED ORDER — SODIUM CHLORIDE 0.9 % IV SOLN
2.0000 g | INTRAVENOUS | Status: DC
  Administered 2020-01-18 – 2020-01-19 (×2): 2 g via INTRAVENOUS
  Filled 2020-01-17 (×4): qty 2

## 2020-01-17 NOTE — Progress Notes (Signed)
  Pharmacy Antibiotic Note  Ryan Rice is a 69 y.o. male admitted on 01/10/2020 with pseudomonas UTI.  Pharmacy has been consulted for cefepime dosing.   Patient is on immunosuppressants for hx of kidney transplant. 4/20 WBC 15. Afebrile. Urine culture positive for >100k pseudomonas. Renal function worsened slightly 4/23, ClCr ~50 ml/min, will dose adjust cefepime. Confirmed with MD ok with 7 day total treatment course.   Plan: Decrease Cefepime to 2g Q24 hr  F/u renal function  Last day 4/27 entered   Height: 5\' 11"  (180.3 cm) Weight: 85.6 kg (188 lb 11.4 oz) IBW/kg (Calculated) : 75.3  Temp (24hrs), Avg:97.9 F (36.6 C), Min:97.7 F (36.5 C), Max:98.3 F (36.8 C)  Recent Labs  Lab 01/11/20 0802 01/12/20 0505 01/13/20 0532 01/14/20 0537 01/17/20 0541  WBC 26.4* 21.4* 16.2* 15.6*  --   CREATININE 1.80* 1.52* 1.24  --  1.44*  LATICACIDVEN  --  1.1  --   --   --     Estimated Creatinine Clearance: 51.6 mL/min (A) (by C-G formula based on SCr of 1.44 mg/dL (H)).    Antimicrobials this admission: Cefepime 4/21 >> (4/27)  Microbiology results: 4/18 UCx:>100k Pseudomonas S cefe  Thank you for allowing pharmacy to be a part of this patient's care.  Benetta Spar, PharmD, BCPS, BCCP Clinical Pharmacist  Please check AMION for all [place] phone numbers After 10:00 PM, call [place] [phone]

## 2020-01-17 NOTE — Progress Notes (Addendum)
[place] PHYSICAL MEDICINE & REHABILITATION PROGRESS NOTE   Subjective/Complaints: Patient seen sitting up in his chair this AM.  He indicates he slept well overnight.  He denies complaints.   ROS: Denies CP, SOB, N/V/D  Objective:   No results found. No results for input(s): WBC, HGB, HCT, PLT in the last 72 hours. Recent Labs    01/17/20 0541  NA 132*  K 4.2  CL 107  CO2 17*  GLUCOSE 222*  BUN 41*  CREATININE 1.44*  CALCIUM 8.2*    Intake/Output Summary (Last 24 hours) at 01/17/2020 [id number] Last data filed at 01/17/2020 0730 Gross per 24 hour  Intake 1263 ml  Output --  Net 1263 ml     Physical Exam: Vital Signs Blood pressure 128/74, pulse 69, temperature 97.7 F (36.5 C), temperature source Oral, resp. rate 17, height 5\' 11"  (1.803 m), weight 85.6 kg, SpO2 99 %. Constitutional: No distress . Vital signs reviewed. HENT: Normocephalic.  Atraumatic. Eyes: EOMI. No discharge. Cardiovascular: No JVD. Respiratory: Normal effort.  No stridor. GI: Non-distended. Skin: Warm and dry.  Intact. Psych: Normal mood.  Normal behavior. Musc: No edema in extremities.  No tenderness in extremities. Neuro: Alert Expressive >>receptive aphasia, slowly improving Motor: RUE/RLE: 5/5 proximal distal LUE/LLE: 4+/5 proximal to distal, stable  Assessment/Plan: 1. Functional deficits secondary to L ACA, MCA embolic infracts which require 3+ hours per day of interdisciplinary therapy in a comprehensive inpatient rehab setting.  Physiatrist is providing close team supervision and 24 hour management of active medical problems listed below.  Physiatrist and rehab team continue to assess barriers to discharge/monitor patient progress toward functional and medical goals  Care Tool:  Bathing    Body parts bathed by patient: Right arm, Left arm, Chest, Abdomen, Front perineal area, Right upper leg, Left upper leg, Right lower leg, Left lower leg, Face   Body parts bathed by helper:  Buttocks     Bathing assist Assist Level: Minimal Assistance - Patient > 75%     Upper Body Dressing/Undressing Upper body dressing   What is the patient wearing?: Pull over shirt    Upper body assist Assist Level: Minimal Assistance - Patient > 75%    Lower Body Dressing/Undressing Lower body dressing      What is the patient wearing?: Incontinence brief, Pants     Lower body assist Assist for lower body dressing: Maximal Assistance - Patient 25 - 49%     Toileting Toileting    Toileting assist Assist for toileting: Maximal Assistance - Patient 25 - 49%     Transfers Chair/bed transfer  Transfers assist     Chair/bed transfer assist level: Minimal Assistance - Patient > 75%     Locomotion Ambulation   Ambulation assist      Assist level: Minimal Assistance - Patient > 75% Assistive device: Walker-rolling Max distance: 55ft   Walk 10 feet activity   Assist     Assist level: Minimal Assistance - Patient > 75% Assistive device: Walker-rolling   Walk 50 feet activity   Assist Walk 50 feet with 2 turns activity did not occur: Safety/medical concerns  Assist level: Minimal Assistance - Patient > 75% Assistive device: Walker-rolling    Walk 150 feet activity   Assist Walk 150 feet activity did not occur: Safety/medical concerns         Walk 10 feet on uneven surface  activity   Assist Walk 10 feet on uneven surfaces activity did not occur: Safety/medical concerns  Wheelchair     Assist   Type of Wheelchair: Agricultural engineer assist level: Supervision/Verbal cueing Max wheelchair distance: 136ft    Wheelchair 50 feet with 2 turns activity    Assist        Assist Level: Supervision/Verbal cueing   Wheelchair 150 feet activity     Assist      Assist Level: Contact Guard/Touching assist   Blood pressure 128/74, pulse 69, temperature 97.7 F (36.5 C), temperature source Oral, resp. rate 17,  height 5\' 11"  (1.803 m), weight 85.6 kg, SpO2 99 %.  Medical Problem List and Plan: 1.  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  Continue CIR 2.  Antithrombotics: -DVT/anticoagulation:  Pharmaceutical: Other (comment)--Eliquis             -antiplatelet therapy: N/A 3. Pain Management: N/A 4. Mood: LCSW to follow for evaluation and support when appropriate.              -antipsychotic agents: N/A 5. Neuropsych: This patient is not capable of making decisions on his own behalf. 6. Skin/Wound Care: Routine pressure relief measures.  7. Fluids/Electrolytes/Nutrition: Monitor I/Os.  8.  HTN: Monitor tid--Labetalol was d/c due to pauses--now on metoprolol, hydralazine and Nifedipine   Remains slightly labile on 4/23, monitor for trend             Monitor with increased mobility 9.  CKD s/p renal transplant: Continues to have upward trend BUN and creatinine despite IV fluids for hydration. On prednisone taper to 10 mg daily. Sepra DS MWF,Prograf and Myfortic.              Creatinine 1.44 on 4/23  Continue to monitor 10. Steroid-induced hyperglycemia on T2DM with hyperglycemia: Hgb A1c- 7.1.  Was on metformin, Starlix and Januvia PTA. Monitor blood sugars ac/hs. Continue Lantus 20 units daily and use SSI for elevated BS.    CBG (last 3)  Recent Labs    01/16/20 1642 01/16/20 2057 01/17/20 0639  GLUCAP 247* 283* 196*    Remains elevated and?  Trending up on 4/23, monitor with steroid taper, no changes at this time             Monitor with increased mobility 11. Leukocytosis-likely secondary to steroids  WBC 15.6 on 4/20, labs ordered for Monday  Afebrile  UA slightly borderline chest x-ray unremarkable for infection 12.  Hyponatremia  Sodium 132 on 4/23  Continue to monitor 13.  Thrombocytopenia  Platelets 112 on 4/20, labs ordered for Monday  Continue to monitor. 14.  Pseudomonas UTI  Discussed with pharmacy, cefepime  initiated on 4/21, discussed with pharmacy, will complete 7-day course  LOS: 7 days A FACE TO FACE EVALUATION WAS PERFORMED  Ryan Rice Lorie Phenix 01/17/2020, 9:27 AM

## 2020-01-17 NOTE — Progress Notes (Signed)
Occupational Therapy Weekly Progress Note  Patient Details  Name: Ryan Rice MRN: [id number] Date of Birth: 21-Feb-1951  Beginning of progress report period: January 11, 2020 End of progress report period: January 17, 2020  Today's Date: 01/17/2020 OT Individual Time: [id number] OT Individual Time Calculation (min): 75 min    Short term goals not set due to estimated length of stay.  Pt is making inconsistent progress towards goals.  Pt can complete self-care tasks of bathing and dressing with as [place] as CGA assist, however on occasions pt requires up to max assist.  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  Pt currently completes transfers with RW Min assist stand pivot or short distance ambulation at Joshua assist level.  Pt demonstrates difficulty with higher level sequencing tasks due to apraxia vs other cognitive impairments.  Pt is easily distracted both internally and externally, requiring increased cues for attention and initiation.    Patient continues to demonstrate the following deficits: muscle weakness,, impaired timing and sequencing, motor apraxia and decreased coordination, decreased attention to right and ideational apraxia, decreased attention, decreased awareness, decreased problem solving, decreased safety awareness, delayed processing and language deflicits and decreased standing balance, decreased postural control and decreased balance strategies and therefore will continue to benefit from skilled OT intervention to enhance overall performance with BADL and Reduce care partner burden.  See Patient's Care Plan for progression toward long term goals.  Patient progressing toward long term goals..  Continue plan of care.  Skilled Therapeutic Interventions/Progress Updates:    Treatment session with focus on functional mobility, transfers, standing  balance, and initiation and sequencing during self-care tasks.  Pt received upright in bed finishing breakfast.  Pt required min cues for initiation as he was distracted by tv.  Pt completed bed mobility with CGA and sit > stand with RW with CGA.  Pt reports need to toilet.  Ambulated to toilet with RW with cues for location of bathroom, pt ambulating overall CGA with RW.  Pt incontinent of bowel and bladder prior to toileting, but continent of both while on toilet.  Pt ambulated to tub bench in room shower with RW with CGA and min cues for location of shower.  Pt completed bathing with min cues for initiation and sequencing, demonstrating improved standing balance and tolerance and ability to wash buttocks.  Pt returned to room and completed dressing with assist to don incontinence brief and then pt able to don pants with increased time.  Therapist assisted with donning hospital socks due to pt demonstrating difficulty.  Pt remained upright in w/c with seat belt alarm on and all needs in reach.  Therapy Documentation Precautions:  Precautions Precautions: Fall Precaution Comments: language deficits; apraxia Restrictions Weight Bearing Restrictions: No General:   Vital Signs: Therapy Vitals Temp: 97.7 F (36.5 C) Temp Source: Oral Pulse Rate: 69 Resp: 17 BP: 128/74 Patient Position (if appropriate): Lying Oxygen Therapy SpO2: 99 % Pain:   Pt with no c/o pain   Therapy/Group: Individual Therapy  [name] 01/17/2020, 7:23 AM

## 2020-01-17 NOTE — Progress Notes (Addendum)
Speech Language Pathology Weekly Progress and Session Note  Patient Details  Name: Ryan Rice MRN: [id number] Date of Birth: 02-15-1951  Beginning of progress report period: January 10, 2020 End of progress report period: January 17, 2020  Today's Date: 01/17/2020 SLP Individual Time: 6010-9323 SLP Individual Time Calculation (min): 45 min  Short Term Goals: Week 1: SLP Short Term Goal 1 (Week 1): Pt will name common objects in 8 out of 10 opportunities with Min A cues. SLP Short Term Goal 1 - Progress (Week 1): Progressing toward goal SLP Short Term Goal 2 (Week 1): Pt will answer basic yes/no questions in 8 out of 10 opportunities with Min A cues. SLP Short Term Goal 2 - Progress (Week 1): Progressing toward goal SLP Short Term Goal 3 (Week 1): Pt will use multimodal means to communicate basic wants and needs with Mod A cues. SLP Short Term Goal 3 - Progress (Week 1): Met SLP Short Term Goal 4 (Week 1): Pt will follow 1 step-directions in 8 out of 10 opportunities with Min A cues. SLP Short Term Goal 4 - Progress (Week 1): Met SLP Short Term Goal 5 (Week 1): Pt will complete basic problem solving tasks with Min A cues. SLP Short Term Goal 5 - Progress (Week 1): Progressing toward goal    New Short Term Goals: Week 2: SLP Short Term Goal 1 (Week 2): Pt will name common objects in 8 out of 10 opportunities with Min A cues. SLP Short Term Goal 2 (Week 2): Pt will answer mildly complex yes/no questions in 8 out of 10 opportunities with Min A cues. SLP Short Term Goal 3 (Week 2): Pt will use multimodal means to communicate basic wants and needs with Min A cues. SLP Short Term Goal 4 (Week 2): Pt will follow 2 step-directions in 8 out of 10 opportunities with Min A cues. SLP Short Term Goal 5 (Week 2): Pt will complete basic problem solving tasks with Min A cues.  Weekly Progress Updates:   Pt has made slow gains this reporting period and has met 2 out of 5 short term goals.  Pt is  currently mod-max assist for tasks due to moderate expressive and receptive language deficits.  Pt has demonstrated improved participation in therapies.  Pt is consuming regular diet, thin liquids with intermittent supervision for use of swallowing precautions and no further ST follow up indicated following bedside swallow evaluation this week.  Pt and family education is ongoing.  Pt would continue to benefit from skilled ST while inpatient in order to maximize functional independence and reduce burden of care prior to discharge.  Anticipate that pt will need 24/7 supervision at discharge in addition to [place] follow up at next level of care.       Intensity: Minumum of 1-2 x/day, 30 to 90 minutes Frequency: 3 to 5 out of 7 days Duration/Length of Stay: 2 to 2.5 weeks Treatment/Interventions: Speech/Language facilitation;Cognitive remediation/compensation   Daily Session  Skilled Therapeutic Interventions:  Pt was seen for skilled ST targeting communication goals.  When therapist first arrived, pt denied having any difficulty with his communication; however, as he struggled to convey his thoughts to therapist in casual conversation he eventually nodded his head "yes" when asked again if he was having trouble communicating with caregivers.  Pt also stated "hard of hearing" as an explanation for communication difficulty but this did not appear to be limiting in today's therapy session.  Pt was able to name 6/10 objects which improved  to 10/10 accuracy with mod assist verbal cues to correct perseverative verbal errors.  Pt also needed mod-max assist verbal cues in addition to a written model to expand expression to the phrase level for 90% accuracy due to perseveration.  During spontaneous conversations, pt continued to need mod assist to recognize and correct errors.  Pt was left in chair with chair alarm set and call bell within reach.  Continue per current plan of care.       Pain Pain Assessment Pain  Scale: 0-10 Pain Score: 0-No pain  Therapy/Group: Individual Therapy  Jhene Westmoreland, Selinda Orion 01/17/2020, 12:20 PM

## 2020-01-17 NOTE — Progress Notes (Signed)
Physical Therapy Session Note  Patient Details  Name: Ryan Rice MRN: [id number] Date of Birth: August 11, 1951  Today's Date: 01/17/2020 PT Individual Time: 1530-1640 PT Individual Time Calculation (min): 70 min   Short Term Goals: Week 1:  PT Short Term Goal 1 (Week 1): Pt will be able to perform transfers with CGA PT Short Term Goal 2 (Week 1): Pt will be able to demonstrate functional dynamic standing balane with CGA PT Short Term Goal 3 (Week 1): Pt will be able to gait x 100' wiht min assist  Skilled Therapeutic Interventions/Progress Updates:   Pt received sitting in WC and agreeable to PT. Pt transported to day room in [facility]. Gait training with RW 2 x 59f with CGA and safety with AD in turns. Dynamic standing balance on blue wedge while engaged in fine motor/cognitive task of peg board puzzle. CGA from PT to maintain balance. Max cues for error detection and correction. PT then attempted to have pt complete moderate difficulty peg board puzzle with max cues for problem solving, visual scanning and color differentiation. Pt unable select proper color when given 4 options or less and able to name color when PT verbalized 3 or less options. Nustep reciprocal movement training x 10 minutes with cues for attention to task and full ROM R and L. Pt stand pivot transfer to WC. Pt noted to have been in continent of bladder. WC mobility through hall    towards room x 1035fwith min assist. Once in room sit<>[facility] and for PT to replace brief. Stand pivot transfer to bed with supervision and RW.  SUpine NMR. SLR, heel slide, SAQ, hip abduction, bridges. Each completed x 10 BLE with AAROM from PT to engage muscle through full range. Pet left supine in bed with call bell in reach and all needs met.       Therapy Documentation Precautions:  Precautions Precautions: Fall Precaution Comments: language deficits; apraxia Restrictions Weight Bearing Restrictions: No Vital  Signs: Therapy Vitals Temp: 98.3 F (36.8 C) Temp Source: Oral Pulse Rate: 76 Resp: 16 BP: 127/70 Patient Position (if appropriate): Sitting Oxygen Therapy SpO2: 100 % O2 Device: Room Air Pain: denies   Therapy/Group: Individual Therapy  AuLorie Phenix/23/2021, 5:30 PM

## 2020-01-18 ENCOUNTER — Inpatient Hospital Stay (HOSPITAL_COMMUNITY): Payer: Medicare HMO | Admitting: Physical Therapy

## 2020-01-18 LAB — GLUCOSE, CAPILLARY
Glucose-Capillary: 136 mg/dL — ABNORMAL HIGH (ref 70–99)
Glucose-Capillary: 100 mg/dL — ABNORMAL HIGH (ref 70–99)
Glucose-Capillary: 197 mg/dL — ABNORMAL HIGH (ref 70–99)
Glucose-Capillary: 224 mg/dL — ABNORMAL HIGH (ref 70–99)

## 2020-01-18 NOTE — Progress Notes (Signed)
Physical Therapy Session Note  Patient Details  Name: Ryan Rice MRN: [id number] Date of Birth: 1950/11/14  Today's Date: 01/18/2020 PT Individual Time: 1315-1345 PT Individual Time Calculation (min): 30 min   Short Term Goals: Week 1:  PT Short Term Goal 1 (Week 1): Pt will be able to perform transfers with CGA PT Short Term Goal 2 (Week 1): Pt will be able to demonstrate functional dynamic standing balane with CGA PT Short Term Goal 3 (Week 1): Pt will be able to gait x 100' wiht min assist  Skilled Therapeutic Interventions/Progress Updates:    Patient received in bed, pleasant and willing to participate in therapy today but noted to have been incontinent of bowel and bladder. Able to complete all bed mobility and sit to stand/stand to sit with min guard today. Able to maintain standing balance with BUE support on RW and general S for about 7 minutes before fatiguing and needing to sit back down; while he was standing, PT and NT performed pericare and cleaning of BLEs where he was soiled from stool. Spent most of session working on standing tolerance/cleaning patient from incontinent BM. He was left in bed with NT present and attending, all other needs met this afternoon. Nursing staff advised regarding large liquidy incontinent BM.   Therapy Documentation Precautions:  Precautions Precautions: Fall Precaution Comments: language deficits; apraxia Restrictions Weight Bearing Restrictions: No General: PT Amount of Missed Time (min): 15 Minutes PT Missed Treatment Reason: Other (Comment)(lunch late/patient requesting to eat) Pain: Pain Assessment Pain Scale: 0-10 Pain Score: 0-No pain    Therapy/Group: Individual Therapy   Windell Norfolk, DPT, PN1   Supplemental Physical Therapist [place]    Pager [phone] Acute Rehab Office [phone]    01/18/2020, 3:38 PM

## 2020-01-19 ENCOUNTER — Inpatient Hospital Stay (HOSPITAL_COMMUNITY): Payer: Medicare HMO | Admitting: Speech Pathology

## 2020-01-19 LAB — GLUCOSE, CAPILLARY
Glucose-Capillary: 95 mg/dL (ref 70–99)
Glucose-Capillary: 166 mg/dL — ABNORMAL HIGH (ref 70–99)
Glucose-Capillary: 188 mg/dL — ABNORMAL HIGH (ref 70–99)
Glucose-Capillary: 204 mg/dL — ABNORMAL HIGH (ref 70–99)

## 2020-01-19 NOTE — Progress Notes (Signed)
[place] PHYSICAL MEDICINE & REHABILITATION PROGRESS NOTE   Subjective/Complaints: .   No pains, discussed CBG and abx plan   ROS: Denies CP, SOB, N/V/D  Objective:   No results found. No results for input(s): WBC, HGB, HCT, PLT in the last 72 hours. Recent Labs    01/17/20 0541  NA 132*  K 4.2  CL 107  CO2 17*  GLUCOSE 222*  BUN 41*  CREATININE 1.44*  CALCIUM 8.2*    Intake/Output Summary (Last 24 hours) at 01/19/2020 0603 Last data filed at 01/18/2020 2100 Gross per 24 hour  Intake 720 ml  Output --  Net 720 ml     Physical Exam: Vital Signs Blood pressure 138/76, pulse 70, temperature 99.6 F (37.6 C), temperature source Oral, resp. rate 18, height 5\' 11"  (1.803 m), weight 89.8 kg, SpO2 98 %. Constitutional: No distress . Vital signs reviewed. HENT: Normocephalic.  Atraumatic. Eyes: EOMI. No discharge. Cardiovascular: No JVD. Respiratory: Normal effort.  No stridor. GI: Non-distended. Skin: Warm and dry.  Intact. Psych: Normal mood.  Normal behavior. Musc: No edema in extremities.  No tenderness in extremities. Neuro: Alert Expressive >>receptive aphasia, slowly improving Motor: RUE/RLE: 5/5 proximal distal LUE/LLE: 4+/5 proximal to distal, stable  Assessment/Plan: 1. Functional deficits secondary to L ACA, MCA embolic infracts which require 3+ hours per day of interdisciplinary therapy in a comprehensive inpatient rehab setting.  Physiatrist is providing close team supervision and 24 hour management of active medical problems listed below.  Physiatrist and rehab team continue to assess barriers to discharge/monitor patient progress toward functional and medical goals  Care Tool:  Bathing    Body parts bathed by patient: Right arm, Left arm, Chest, Abdomen, Front perineal area, Right upper leg, Left upper leg, Right lower leg, Left lower leg, Face, Buttocks   Body parts bathed by helper: Buttocks     Bathing assist Assist Level: Contact  Guard/Touching assist     Upper Body Dressing/Undressing Upper body dressing   What is the patient wearing?: Hospital gown only    Upper body assist Assist Level: Minimal Assistance - Patient > 75%    Lower Body Dressing/Undressing Lower body dressing      What is the patient wearing?: Incontinence brief, Pants     Lower body assist Assist for lower body dressing: Minimal Assistance - Patient > 75%     Toileting Toileting    Toileting assist Assist for toileting: Moderate Assistance - Patient 50 - 74%     Transfers Chair/bed transfer  Transfers assist     Chair/bed transfer assist level: Minimal Assistance - Patient > 75%     Locomotion Ambulation   Ambulation assist      Assist level: Minimal Assistance - Patient > 75% Assistive device: Walker-rolling Max distance: 63ft   Walk 10 feet activity   Assist     Assist level: Minimal Assistance - Patient > 75% Assistive device: Walker-rolling   Walk 50 feet activity   Assist Walk 50 feet with 2 turns activity did not occur: Safety/medical concerns  Assist level: Minimal Assistance - Patient > 75% Assistive device: Walker-rolling    Walk 150 feet activity   Assist Walk 150 feet activity did not occur: Safety/medical concerns         Walk 10 feet on uneven surface  activity   Assist Walk 10 feet on uneven surfaces activity did not occur: Safety/medical concerns         Wheelchair     Assist  Type of Wheelchair: Manual    Wheelchair assist level: Supervision/Verbal cueing Max wheelchair distance: 176ft    Wheelchair 50 feet with 2 turns activity    Assist        Assist Level: Supervision/Verbal cueing   Wheelchair 150 feet activity     Assist      Assist Level: Contact Guard/Touching assist   Blood pressure 138/76, pulse 70, temperature 99.6 F (37.6 C), temperature source Oral, resp. rate 18, height 5\' 11"  (1.803 m), weight 89.8 kg, SpO2 98 %.  Medical  Problem List and Plan: 1.  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  Continue CIR PT, OT, SLP 2.  Antithrombotics: -DVT/anticoagulation:  Pharmaceutical: Other (comment)--Eliquis             -antiplatelet therapy: N/A 3. Pain Management: N/A 4. Mood: LCSW to follow for evaluation and support when appropriate.              -antipsychotic agents: N/A 5. Neuropsych: This patient is not capable of making decisions on his own behalf. 6. Skin/Wound Care: Routine pressure relief measures.  7. Fluids/Electrolytes/Nutrition: Monitor I/Os.  8.  HTN: Monitor tid--Labetalol was d/c due to pauses--now on metoprolol, hydralazine and Nifedipine   Remains slightly labile on 4/23, monitor for trend             Monitor with increased mobility 9.  CKD s/p renal transplant: Continues to have upward trend BUN and creatinine despite IV fluids for hydration. On prednisone taper to 10 mg daily. Sepra DS MWF,Prograf and Myfortic.              Creatinine 1.44 on 4/23  Continue to monitor 10. Steroid-induced hyperglycemia on T2DM with hyperglycemia: Hgb A1c- 7.1.  Was on metformin, Starlix and Januvia PTA. Monitor blood sugars ac/hs. Continue Lantus 20 units daily and use SSI for elevated BS.    CBG (last 3)  Recent Labs    01/18/20 1130 01/18/20 1642 01/18/20 2046  GLUCAP 100* 197* 224*    PM elevation still on prednisone 30mg  in am reducing to 20mg  on Tues 4/27             Monitor with increased mobility 11. Leukocytosis-likely secondary to steroids  WBC 15.6 on 4/20, labs ordered for Monday  Afebrile  UA slightly borderline chest x-ray unremarkable for infection 12.  Hyponatremia  Sodium 132 on 4/23  Recheck in am  13.  Thrombocytopenia  Platelets 112 on 4/20, labs ordered for Monday  Continue to monitor. 14.  Pseudomonas UTI  Discussed with pharmacy, cefepime initiated on 4/21, discussed with pharmacy, will complete 7-day course  LOS: 9  days A FACE TO FACE EVALUATION WAS PERFORMED  Charlett Blake 01/19/2020, 6:03 AM

## 2020-01-19 NOTE — Progress Notes (Signed)
Speech Language Pathology Daily Session Note  Patient Details  Name: Ryan Rice MRN: [id number] Date of Birth: 09-20-1951  Today's Date: 01/19/2020 SLP Individual Time: 0917-0959 SLP Individual Time Calculation (min): 42 min  Short Term Goals: Week 1: SLP Short Term Goal 1 (Week 1): Pt will name common objects in 8 out of 10 opportunities with Min A cues. SLP Short Term Goal 1 - Progress (Week 1): Progressing toward goal SLP Short Term Goal 2 (Week 1): Pt will answer basic yes/no questions in 8 out of 10 opportunities with Min A cues. SLP Short Term Goal 2 - Progress (Week 1): Progressing toward goal SLP Short Term Goal 3 (Week 1): Pt will use multimodal means to communicate basic wants and needs with Mod A cues. SLP Short Term Goal 3 - Progress (Week 1): Met SLP Short Term Goal 4 (Week 1): Pt will follow 1 step-directions in 8 out of 10 opportunities with Min A cues. SLP Short Term Goal 4 - Progress (Week 1): Met SLP Short Term Goal 5 (Week 1): Pt will complete basic problem solving tasks with Min A cues. SLP Short Term Goal 5 - Progress (Week 1): Progressing toward goal  Skilled Therapeutic Interventions: Pt was seen for skilled ST targeting communication goals. SLP provided Min A phonemic and written cues during confrontation naming tasks; pt named common objects from photos with initially 9/12 accuracy, repeating naming task increased accuracy slightly (10/12). Pt required overall Max A verbal and written models to extend to phrase length utterances when discussing object functions. Although SLP attempted to fade out written models, pt only able to reproduce desired phrase X1 (out of 24 opportunities) without the written aid. Pt left laying in bed with alarm set and needs within reach. Continue per current plan of care.        Pain Pain Assessment Pain Scale: 0-10 Pain Score: 0-No pain  Therapy/Group: Individual Therapy  Arbutus Leas 01/19/2020, 7:00 AM

## 2020-01-20 ENCOUNTER — Inpatient Hospital Stay (HOSPITAL_COMMUNITY): Payer: Medicare HMO | Admitting: Occupational Therapy

## 2020-01-20 ENCOUNTER — Inpatient Hospital Stay (HOSPITAL_COMMUNITY): Payer: Medicare HMO

## 2020-01-20 ENCOUNTER — Inpatient Hospital Stay (HOSPITAL_COMMUNITY): Payer: Medicare HMO | Admitting: Speech Pathology

## 2020-01-20 LAB — CBC
HCT: 35.7 % — ABNORMAL LOW (ref 39.0–52.0)
Hemoglobin: 11.6 g/dL — ABNORMAL LOW (ref 13.0–17.0)
MCH: 26.8 pg (ref 26.0–34.0)
MCHC: 32.5 g/dL (ref 30.0–36.0)
MCV: 82.4 fL (ref 80.0–100.0)
Platelets: 81 10*3/uL — ABNORMAL LOW (ref 150–400)
RBC: 4.33 MIL/uL (ref 4.22–5.81)
RDW: 15.7 % — ABNORMAL HIGH (ref 11.5–15.5)
WBC: 6.9 10*3/uL (ref 4.0–10.5)
nRBC: 0 % (ref 0.0–0.2)

## 2020-01-20 LAB — BASIC METABOLIC PANEL
Anion gap: 7 (ref 5–15)
BUN: 28 mg/dL — ABNORMAL HIGH (ref 8–23)
CO2: 17 mmol/L — ABNORMAL LOW (ref 22–32)
Calcium: 8.1 mg/dL — ABNORMAL LOW (ref 8.9–10.3)
Chloride: 108 mmol/L (ref 98–111)
Creatinine, Ser: 1.17 mg/dL (ref 0.61–1.24)
GFR calc Af Amer: >60 mL/min (ref >60)
GFR calc non Af Amer: >60 mL/min (ref >60)
Glucose, Bld: 160 mg/dL — ABNORMAL HIGH (ref 70–99)
Potassium: 4 mmol/L (ref 3.5–5.1)
Sodium: 132 mmol/L — ABNORMAL LOW (ref 135–145)

## 2020-01-20 LAB — GLUCOSE, CAPILLARY
Glucose-Capillary: 153 mg/dL — ABNORMAL HIGH (ref 70–99)
Glucose-Capillary: 176 mg/dL — ABNORMAL HIGH (ref 70–99)
Glucose-Capillary: 198 mg/dL — ABNORMAL HIGH (ref 70–99)
Glucose-Capillary: 220 mg/dL — ABNORMAL HIGH (ref 70–99)

## 2020-01-20 MED ORDER — SODIUM CHLORIDE 0.9 % IV SOLN
2.0000 g | Freq: Two times a day (BID) | INTRAVENOUS | Status: AC
  Administered 2020-01-20 – 2020-01-21 (×4): 2 g via INTRAVENOUS
  Filled 2020-01-20 (×5): qty 2

## 2020-01-20 NOTE — Progress Notes (Signed)
Occupational Therapy Session Note  Patient Details  Name: Ryan Rice MRN: [id number] Date of Birth: March 10, 1951  Today's Date: 01/20/2020 OT Individual Time: 1530-1555 OT Individual Time Calculation (min): 25 min    Short Term Goals: Week 2:  OT Short Term Goal 1 (Week 2): STG = LTGs due to remaining LOS  Skilled Therapeutic Interventions/Progress Updates:    Upon entering the room, pt seated in wheelchair with no c/o pain. Pt requests to return to bed secondary to fatigue. Pt standing from wheelchair with CGA and transferring to bed with assist from therapist for management of IV as well. Pt seated on EOB with increased time and CGA as he unlaced shoes and pulled them off. Pt performed sit >supine with CGA for safety. OT assisted pt with repositioning and pt falling asleep as therapist exits the room. Call bell and all needed items within reach upon exiting the room. Mats places on floor for safety and bed alarm activated.   Therapy Documentation Precautions:  Precautions Precautions: Fall Precaution Comments: language deficits; apraxia Restrictions Weight Bearing Restrictions: No General:   Vital Signs: Therapy Vitals Temp: 98 F (36.7 C) Pulse Rate: 69 Resp: 18 BP: 119/68 Patient Position (if appropriate): Sitting Oxygen Therapy SpO2: 90 % O2 Device: Room Air Pain: Pain Assessment Pain Scale: 0-10 Pain Score: 0-No pain Faces Pain Scale: No hurt ADL: ADL Where Assessed-Eating: Chair Grooming: Minimal assistance Upper Body Bathing: Setup Where Assessed-Upper Body Bathing: Sitting at sink Lower Body Bathing: Moderate assistance Where Assessed-Lower Body Bathing: Wheelchair, Sitting at sink Upper Body Dressing: Supervision/safety Lower Body Dressing: Moderate assistance Where Assessed-Lower Body Dressing: Sitting at sink Toileting: (total A due to incontience) Toilet Transfer: Minimal assistance Toilet Transfer Method: Stand pivot   Therapy/Group: Individual  Therapy  Gypsy Decant 01/20/2020, 4:30 PM

## 2020-01-20 NOTE — Plan of Care (Signed)
  Problem: Consults Goal: RH STROKE PATIENT EDUCATION Description: See Patient Education module for education specifics  Outcome: Progressing   Problem: RH BOWEL ELIMINATION Goal: RH STG MANAGE BOWEL WITH ASSISTANCE Description: STG Manage Bowel with min Assistance. Outcome: Progressing   Problem: RH BLADDER ELIMINATION Goal: RH STG MANAGE BLADDER WITH ASSISTANCE Description: STG Manage Bladder With min Assistance Outcome: Progressing Goal: RH STG MANAGE BLADDER WITH MEDICATION WITH ASSISTANCE Description: STG Manage Bladder With Medication With min Assistance. Outcome: Progressing

## 2020-01-20 NOTE — Progress Notes (Signed)
[place] PHYSICAL MEDICINE & REHABILITATION PROGRESS NOTE   Subjective/Complaints: Patient seen sitting up in bed this morning.  He indicates he slept well overnight and had a good weekend.  No reported issues.  ROS: Denies CP, SOB, N/V/D  Objective:   No results found. Recent Labs    01/20/20 0624  WBC 6.9  HGB 11.6*  HCT 35.7*  PLT 81*   Recent Labs    01/20/20 0624  NA 132*  K 4.0  CL 108  CO2 17*  GLUCOSE 160*  BUN 28*  CREATININE 1.17  CALCIUM 8.1*    Intake/Output Summary (Last 24 hours) at 01/20/2020 0839 Last data filed at 01/19/2020 2200 Gross per 24 hour  Intake 697 ml  Output --  Net 697 ml     Physical Exam: Vital Signs Blood pressure (!) 150/68, pulse 72, temperature 98.4 F (36.9 C), resp. rate 19, height 5\' 11"  (1.803 m), weight 89.8 kg, SpO2 99 %. Constitutional: No distress . Vital signs reviewed. HENT: Normocephalic.  Atraumatic. Eyes: EOMI. No discharge. Cardiovascular: No JVD. Respiratory: Normal effort.  No stridor. GI: Non-distended. Skin: Warm and dry.  Intact. Psych: Normal mood.  Normal behavior. Musc: No edema in extremities.  No tenderness in extremities. Neuro: Alert Expressive >>receptive aphasia, slowly improving Motor: RUE/RLE: 5/5 proximal distal LUE/LLE: 4+/5 proximal to distal, unchanged  Assessment/Plan: 1. Functional deficits secondary to L ACA, MCA embolic infracts which require 3+ hours per day of interdisciplinary therapy in a comprehensive inpatient rehab setting.  Physiatrist is providing close team supervision and 24 hour management of active medical problems listed below.  Physiatrist and rehab team continue to assess barriers to discharge/monitor patient progress toward functional and medical goals  Care Tool:  Bathing    Body parts bathed by patient: Right arm, Left arm, Chest, Abdomen, Front perineal area, Right upper leg, Left upper leg, Right lower leg, Left lower leg, Face, Buttocks   Body parts  bathed by helper: Buttocks     Bathing assist Assist Level: Contact Guard/Touching assist     Upper Body Dressing/Undressing Upper body dressing   What is the patient wearing?: Hospital gown only    Upper body assist Assist Level: Minimal Assistance - Patient > 75%    Lower Body Dressing/Undressing Lower body dressing      What is the patient wearing?: Incontinence brief, Pants     Lower body assist Assist for lower body dressing: Minimal Assistance - Patient > 75%     Toileting Toileting    Toileting assist Assist for toileting: Moderate Assistance - Patient 50 - 74%     Transfers Chair/bed transfer  Transfers assist     Chair/bed transfer assist level: Minimal Assistance - Patient > 75%     Locomotion Ambulation   Ambulation assist      Assist level: Minimal Assistance - Patient > 75% Assistive device: Walker-rolling Max distance: 63ft   Walk 10 feet activity   Assist     Assist level: Minimal Assistance - Patient > 75% Assistive device: Walker-rolling   Walk 50 feet activity   Assist Walk 50 feet with 2 turns activity did not occur: Safety/medical concerns  Assist level: Minimal Assistance - Patient > 75% Assistive device: Walker-rolling    Walk 150 feet activity   Assist Walk 150 feet activity did not occur: Safety/medical concerns         Walk 10 feet on uneven surface  activity   Assist Walk 10 feet on uneven surfaces activity did  not occur: Safety/medical concerns         Wheelchair     Assist   Type of Wheelchair: Manual    Wheelchair assist level: Supervision/Verbal cueing Max wheelchair distance: 158ft    Wheelchair 50 feet with 2 turns activity    Assist        Assist Level: Supervision/Verbal cueing   Wheelchair 150 feet activity     Assist      Assist Level: Contact Guard/Touching assist   Blood pressure (!) 150/68, pulse 72, temperature 98.4 F (36.9 C), resp. rate 19, height 5'  11" (1.803 m), weight 89.8 kg, SpO2 99 %.  Medical Problem List and Plan: 1.  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  Continue CIR 2.  Antithrombotics: -DVT/anticoagulation:  Pharmaceutical: Other (comment)--Eliquis             -antiplatelet therapy: N/A 3. Pain Management: N/A 4. Mood: LCSW to follow for evaluation and support when appropriate.              -antipsychotic agents: N/A 5. Neuropsych: This patient is not capable of making decisions on his own behalf. 6. Skin/Wound Care: Routine pressure relief measures.  7. Fluids/Electrolytes/Nutrition: Monitor I/Os.  8.  HTN: Monitor tid--Labetalol was d/c due to pauses--now on metoprolol, hydralazine and Nifedipine   Remains slightly labile on 4/26, monitor for trend             Monitor with increased mobility 9.  CKD s/p renal transplant: Continues to have upward trend BUN and creatinine despite IV fluids for hydration. On prednisone taper to 10 mg daily. Sepra DS MWF,Prograf and Myfortic.              Creatinine 1.17 on 4/26  Continue to monitor 10. Steroid-induced hyperglycemia on T2DM with hyperglycemia: Hgb A1c- 7.1.  Was on metformin, Starlix and Januvia PTA. Monitor blood sugars ac/hs. Continue Lantus 20 units daily and use SSI for elevated BS.    CBG (last 3)  Recent Labs    01/19/20 1627 01/19/20 2113 01/20/20 0608  GLUCAP 188* 204* 153*    Labile on 4/26, monitor with steroid wean             Monitor with increased mobility 11. Leukocytosis-likely secondary to steroids:?  Resolved  WBC 6.9 on 4/26  Afebrile  UA slightly borderline chest x-ray unremarkable for infection 12.  Hyponatremia  Sodium 132 on 4/26  Recheck in am  13.  Thrombocytopenia  Platelets 81 on 4/26  Continue to monitor. 14.  Pseudomonas UTI  Discussed with pharmacy, cefepime initiated on 4/21-4/28  LOS: 10 days A FACE TO FACE EVALUATION WAS PERFORMED  Ryan Rice Ryan Rice 01/20/2020, 8:39  AM

## 2020-01-20 NOTE — Progress Notes (Signed)
Physical Therapy Weekly Progress Note  Patient Details  Name: Ryan Rice MRN: [id number] Date of Birth: 1951-04-14  Beginning of progress report period: January 11, 2020 End of progress report period: January 20, 2020  Today's Date: 01/20/2020 PT Individual Time: 0800-0856 PT Individual Time Calculation (min): 56 min   Patient has met 1 of 3 short term goals. Pt demonstrates slow progress towards long term goals and fluctuates between requiring CGA and min A for functional mobility. Pt currently requires supervision for bed mobility, min A/CGA for transfers with RW, min A/CGA to ambulate >171f with RW, min A to navigate 8 steps with 2 rails, and supervision for WC mobility. Pt continues to be limited by dysarthria, apraxia, poor safety awareness, and loose stools limiting therapy time.   Patient continues to demonstrate the following deficits muscle weakness, impaired timing and sequencing, motor apraxia, decreased coordination and decreased motor planning and decreased standing balance, decreased postural control, hemiplegia and decreased balance strategies and therefore will continue to benefit from skilled PT intervention to increase functional independence with mobility.  Patient progressing toward long term goals..  Continue plan of care.  PT Short Term Goals Week 1:  PT Short Term Goal 1 (Week 1): Pt will be able to perform transfers with CGA PT Short Term Goal 1 - Progress (Week 1): Progressing toward goal PT Short Term Goal 2 (Week 1): Pt will be able to demonstrate functional dynamic standing balane with CGA PT Short Term Goal 2 - Progress (Week 1): Progressing toward goal PT Short Term Goal 3 (Week 1): Pt will be able to gait x 100' wiht min assist PT Short Term Goal 3 - Progress (Week 1): Met Week 2:  PT Short Term Goal 1 (Week 2): STG=LTG due to LOS  Skilled Therapeutic Interventions/Progress Updates:  Ambulation/gait training;Balance/vestibular training;Cognitive  remediation/compensation;Community reintegration;Discharge planning;Disease management/prevention;DME/adaptive equipment instruction;Functional mobility training;Neuromuscular re-education;Pain management;Patient/family education;Psychosocial support;Skin care/wound management;Splinting/orthotics;Stair training;Therapeutic Activities;UE/LE Strength taining/ROM;Therapeutic Exercise;UE/LE Coordination activities;Visual/perceptual remediation/compensation;Wheelchair propulsion/positioning;Functional electrical stimulation   Today's Interventions: Received pt supine in bed, pt agreeable to therapy, and reported discomfort in stomach but did not state level. Therapist suggested using bathroom and continuing to eat breakfast however pt declined. Session focused on dressing, functional mobility/transfers, LE strength, dynamic standing balance/coordination, ambulation, stair navigation, and improved activity tolerance. Noted pt with soiled brief. Pt rolled to L and R with supervision and use of bedrails and required max A to doff soiled brief and don clean one. Pt donned pants in supine with mod A. Pt transferred supine<>sitting EOB with supervision and donned pull over shirt with min A and donned socks and shoes with max A for time management purposes. Pt transferred stand<>pivot bed<>WC with RW CGA. Pt performed WC mobility 1536fusing bilateral UEs, supervision, and increased time to therapy gym. Pt navigated 8 steps with 2 rails min/mod A ascending and descending with a step through pattern. Pt required verbal cues for step sequence, foot placement on step, and technique. Pt unable to give direct answer when asked how many stairs he has to enter home, at first stating "10" and then when asked again stated "4". Pt required multiple rest breaks throughout session due to increased fatigue. Pt ambulated 18088fith RW CGA (min A for RW safety when turning). Pt required less cues overall for RW safety and demonstrated  improved stride length with gait. Pt ambulated 5ft39fth RW CGA to/from parallel bars and performed forward/backwards tandem walking x 2 laps inside parallel bars with bilateral UE support and min A.  Therapist suspected pt with BM due to smell, however when asked, pt stated he did not have BM. Pt transported back to room in [facility] total assist and ambulated 67f with RW min A to/from bathroom. Noted pt with liquid BM soiling brief. Pt continued with small liquid BM sitting on commode and required total assist for peri-care and to doff soiled brief and don clean one. Concluded session with pt sitting in WC, needs within reach, and seatbelt alarm on. RN aware of pt's BM.   Therapy Documentation Precautions:  Precautions Precautions: Fall Precaution Comments: language deficits; apraxia Restrictions Weight Bearing Restrictions: No  Therapy/Group: Individual Therapy AAlfonse AlpersPT, DPT   01/20/2020, 7:29 AM

## 2020-01-20 NOTE — Progress Notes (Signed)
Occupational Therapy Session Note  Patient Details  Name: Ryan Rice MRN: [id number] Date of Birth: [birth year]  Today's Date: 01/20/2020 OT Individual Time: [id number] OT Individual Time Calculation (min): 69 min    Short Term Goals: Week 2:  OT Short Term Goal 1 (Week 2): STG = LTGs due to remaining LOS  Skilled Therapeutic Interventions/Progress Updates:    Treatment session with focus on sit > stand, standing tolerance, and attention to task and ability to follow one to two step commands.  Pt received upright in w/c declining any bathing/dressing this session.  Pt completed all sit > stand throughout session CGA and Min assist stand pivot transfers with RW.  Pt continues to demonstrate decreased activity tolerance in standing, however able to stand 1-2 mins before requiring seated rest break.  Engaged in pattern completion with resistive clothespins, alternating UE.  Pt required mod-max verbal cues for sequence of pattern with no errors when therapist stating pattern, pt able to correctly identify which color came next.  Without cues pt unable to complete pattern correctly.  Completed pipe tree puzzle in standing (as above) and pt demonstrating improved initiation during task however unable to identify errors even when going step by step together with picture.  Pt improved when given choice of 2 or 3 compared to box of pieces.  Completed 9 hole peg test: Rt: 48 seconds, Lt: 57 seconds and Box and Blocks: Rt: 30 and Lt: 28.  Pt's wife had verbalized concerns of pt not using his LUE, however demonstrated good use of BUE throughout session.  Pt does continue to demonstrate inconsistent Rt inattention.  Returned to room and left upright in w/c with seat belt alarm on and all needs in reach.  Therapy Documentation Precautions:  Precautions Precautions: Fall Precaution Comments: language deficits; apraxia Restrictions Weight Bearing Restrictions: No Pain:  Pt with no c/o  pain   Therapy/Group: Individual Therapy  [name] 01/20/2020, 12:13 PM

## 2020-01-20 NOTE — Progress Notes (Signed)
Speech Language Pathology Daily Session Note  Patient Details  Name: Zahki Cypert MRN: [id number] Date of Birth: 12-Jun-1951  Today's Date: 01/20/2020 SLP Individual Time: 1315-1345 SLP Individual Time Calculation (min): 30 min  Short Term Goals: Week 2: SLP Short Term Goal 1 (Week 2): Pt will name common objects in 8 out of 10 opportunities with Min A cues. SLP Short Term Goal 2 (Week 2): Pt will answer mildly complex yes/no questions in 8 out of 10 opportunities with Min A cues. SLP Short Term Goal 3 (Week 2): Pt will use multimodal means to communicate basic wants and needs with Min A cues. SLP Short Term Goal 4 (Week 2): Pt will follow 2 step-directions in 8 out of 10 opportunities with Min A cues. SLP Short Term Goal 5 (Week 2): Pt will complete basic problem solving tasks with Min A cues.  Skilled Therapeutic Interventions: Skilled treatment session focused on communication goals. SLP facilitated session by providing Min A verbal and visual cues for patient to read aloud at the phrase level. The patient demonstrated 90% accuracy in a phrase closure task with Max phonemic cues due to verbal perseveration. Patient also named functional items with 100% accuracy and supervision level verbal cues. Patient answered yes/no questions appropriately in regards to wants/needs and verbalized at the phrase and sentence level in response to basic questions. Patient left upright in the wheelchair with alarm on and all needs within reach. Continue with current plan of care.      Pain No/Denies Pain   Therapy/Group: Individual Therapy  Alpa Salvo 01/20/2020, 1:52 PM

## 2020-01-20 NOTE — Progress Notes (Signed)
  Pharmacy Antibiotic Note  Ryan Rice is a 69 y.o. male admitted on 01/10/2020 with pseudomonas UTI.  Pharmacy has been consulted for cefepime dosing.   Patient is on immunosuppressants for hx of kidney transplant. WBC to wnl on cefepime. Afebrile. Renal function improved, ClCr ~63 ml/min, increased dose for last 48 hrs. Confirmed with MD ok with 7 day total treatment course.   Plan: Increase Cefepime to 2g Q12 hr  F/u renal function  Last day 4/27 entered   Height: 5\' 11"  (180.3 cm) Weight: 89.8 kg (197 lb 15.6 oz) IBW/kg (Calculated) : 75.3  Temp (24hrs), Avg:98.7 F (37.1 C), Min:98.4 F (36.9 C), Max:98.8 F (37.1 C)  Recent Labs  Lab 01/14/20 0537 01/17/20 0541 01/20/20 0624  WBC 15.6*  --  6.9  CREATININE  --  1.44* 1.17    Estimated Creatinine Clearance: 63.5 mL/min (by C-G formula based on SCr of 1.17 mg/dL).    Antimicrobials this admission: Cefepime 4/21 >> (4/27)  Microbiology results: 4/18 UCx:>100k Pseudomonas S cefe  Thank you for allowing pharmacy to be a part of this patient's care.  Benetta Spar, PharmD, BCPS, BCCP Clinical Pharmacist  Please check AMION for all [place] phone numbers After 10:00 PM, call [place] [phone]

## 2020-01-21 ENCOUNTER — Inpatient Hospital Stay (HOSPITAL_COMMUNITY): Payer: Medicare HMO

## 2020-01-21 ENCOUNTER — Inpatient Hospital Stay (HOSPITAL_COMMUNITY): Payer: Medicare HMO | Admitting: Speech Pathology

## 2020-01-21 ENCOUNTER — Inpatient Hospital Stay (HOSPITAL_COMMUNITY): Payer: Medicare HMO | Admitting: Occupational Therapy

## 2020-01-21 LAB — GLUCOSE, CAPILLARY
Glucose-Capillary: 147 mg/dL — ABNORMAL HIGH (ref 70–99)
Glucose-Capillary: 241 mg/dL — ABNORMAL HIGH (ref 70–99)
Glucose-Capillary: 249 mg/dL — ABNORMAL HIGH (ref 70–99)
Glucose-Capillary: 235 mg/dL — ABNORMAL HIGH (ref 70–99)
Glucose-Capillary: 224 mg/dL — ABNORMAL HIGH (ref 70–99)

## 2020-01-21 NOTE — Progress Notes (Signed)
[place] PHYSICAL MEDICINE & REHABILITATION PROGRESS NOTE   Subjective/Complaints: Patient seen sitting up in bed this morning eating breakfast.  He states he slept well overnight.  Continues to have expressive aphasia, but more aware of deficits and trying to work through them.  ROS: Denies CP, SOB, N/V/D  Objective:   No results found. Recent Labs    01/20/20 0624  WBC 6.9  HGB 11.6*  HCT 35.7*  PLT 81*   Recent Labs    01/20/20 0624  NA 132*  K 4.0  CL 108  CO2 17*  GLUCOSE 160*  BUN 28*  CREATININE 1.17  CALCIUM 8.1*    Intake/Output Summary (Last 24 hours) at 01/21/2020 0805 Last data filed at 01/20/2020 2300 Gross per 24 hour  Intake 440 ml  Output --  Net 440 ml     Physical Exam: Vital Signs Blood pressure 128/61, pulse 72, temperature 97.9 F (36.6 C), temperature source Oral, resp. rate 18, height 5\' 11"  (1.803 m), weight 89.8 kg, SpO2 100 %. Constitutional: No distress . Vital signs reviewed. HENT: Normocephalic.  Atraumatic. Eyes: EOMI. No discharge. Cardiovascular: No JVD. Respiratory: Normal effort.  No stridor. GI: Non-distended. Skin: Warm and dry.  Intact. Psych: Normal mood.  Normal behavior. Musc: No edema in extremities.  No tenderness in extremities. Neuro: Alert Expressive >>receptive aphasia, continues to improve Motor: RUE/RLE: 5/5 proximal distal LUE/LLE: 4+/5 proximal to distal, stable  Assessment/Plan: 1. Functional deficits secondary to L ACA, MCA embolic infracts which require 3+ hours per day of interdisciplinary therapy in a comprehensive inpatient rehab setting.  Physiatrist is providing close team supervision and 24 hour management of active medical problems listed below.  Physiatrist and rehab team continue to assess barriers to discharge/monitor patient progress toward functional and medical goals  Care Tool:  Bathing    Body parts bathed by patient: Right arm, Left arm, Chest, Abdomen, Front perineal area, Right  upper leg, Left upper leg, Right lower leg, Left lower leg, Face, Buttocks   Body parts bathed by helper: Buttocks     Bathing assist Assist Level: Contact Guard/Touching assist     Upper Body Dressing/Undressing Upper body dressing   What is the patient wearing?: Hospital gown only    Upper body assist Assist Level: Minimal Assistance - Patient > 75%    Lower Body Dressing/Undressing Lower body dressing      What is the patient wearing?: Incontinence brief, Pants     Lower body assist Assist for lower body dressing: Minimal Assistance - Patient > 75%     Toileting Toileting    Toileting assist Assist for toileting: Moderate Assistance - Patient 50 - 74%     Transfers Chair/bed transfer  Transfers assist     Chair/bed transfer assist level: Contact Guard/Touching assist     Locomotion Ambulation   Ambulation assist      Assist level: Contact Guard/Touching assist Assistive device: Walker-rolling Max distance: 121ft   Walk 10 feet activity   Assist     Assist level: Contact Guard/Touching assist Assistive device: Walker-rolling   Walk 50 feet activity   Assist Walk 50 feet with 2 turns activity did not occur: Safety/medical concerns  Assist level: Contact Guard/Touching assist Assistive device: Walker-rolling    Walk 150 feet activity   Assist Walk 150 feet activity did not occur: Safety/medical concerns  Assist level: Contact Guard/Touching assist Assistive device: Walker-rolling    Walk 10 feet on uneven surface  activity   Assist Walk 10 feet  on uneven surfaces activity did not occur: Safety/medical concerns         Wheelchair     Assist   Type of Wheelchair: Manual    Wheelchair assist level: Supervision/Verbal cueing Max wheelchair distance: 153ft    Wheelchair 50 feet with 2 turns activity    Assist        Assist Level: Supervision/Verbal cueing   Wheelchair 150 feet activity     Assist       Assist Level: Supervision/Verbal cueing   Blood pressure 128/61, pulse 72, temperature 97.9 F (36.6 C), temperature source Oral, resp. rate 18, height 5\' 11"  (1.803 m), weight 89.8 kg, SpO2 100 %.  Medical Problem List and Plan: 1.  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  Continue CIR 2.  Antithrombotics: -DVT/anticoagulation:  Pharmaceutical: Other (comment)--Eliquis             -antiplatelet therapy: N/A 3. Pain Management: N/A 4. Mood: LCSW to follow for evaluation and support when appropriate.              -antipsychotic agents: N/A 5. Neuropsych: This patient is not capable of making decisions on his own behalf. 6. Skin/Wound Care: Routine pressure relief measures.  7. Fluids/Electrolytes/Nutrition: Monitor I/Os.  8.  HTN: Monitor tid--Labetalol was d/c due to pauses--now on metoprolol, hydralazine and Nifedipine   Remains slightly labile on 4/27             Monitor with increased mobility 9.  CKD s/p renal transplant: Continues to have upward trend BUN and creatinine despite IV fluids for hydration. On prednisone taper to 10 mg daily. Sepra DS MWF,Prograf and Myfortic.              Creatinine 1.17 on 4/26  Continue to monitor 10. Steroid-induced hyperglycemia on T2DM with hyperglycemia: Hgb A1c- 7.1.  Was on metformin, Starlix and Januvia PTA. Monitor blood sugars ac/hs. Continue Lantus 20 units daily and use SSI for elevated BS.    CBG (last 3)  Recent Labs    01/20/20 1710 01/20/20 2103 01/21/20 0624  GLUCAP 198* 220* 147*    Remains labile with steroid taper on 4/27             Monitor with increased mobility 11. Leukocytosis-likely secondary to steroids:?  Resolved  WBC 6.9 on 4/26  Afebrile  UA slightly borderline chest x-ray unremarkable for infection 12.  Hyponatremia  Sodium 132 on 4/26  Recheck in am  13.  Thrombocytopenia  Platelets 81 on 4/26  Continue to monitor. 14.  Pseudomonas  UTI  Discussed with pharmacy, cefepime 4/21-4/28  LOS: 11 days A FACE TO FACE EVALUATION WAS PERFORMED  Nakesha Ebrahim Lorie Phenix 01/21/2020, 8:05 AM

## 2020-01-21 NOTE — Progress Notes (Signed)
Occupational Therapy Session Note  Patient Details  Name: Ryan Rice MRN: [id number] Date of Birth: September 29, 1950  Today's Date: 01/21/2020 OT Individual Time: [id number] OT Individual Time Calculation (min): 75 min    Short Term Goals: Week 2:  OT Short Term Goal 1 (Week 2): STG = LTGs due to remaining LOS  Skilled Therapeutic Interventions/Progress Updates:    Treatment session with focus on functional transfers, dynamic standing balance, and attention to task during self-care retraining.  Pt received upright having just begun breakfast.  Pt demonstrated increased attention to task with TV turned off and minimal distractions.  Pt agreeable to shower.  Completed bed mobility Supervision and ambulated to toilet in bathroom with RW CGA.  Pt completed toileting with assist for hygiene and clothing management due to saturated incontinence brief.  Transferred to tub bench in room shower with RW with CGA and cues for sequencing.  Pt completed bathing at overall supervision, min cues for sequencing to wash buttocks.  Completed dressing with setup assist, CGA when standing to pull pants over hips.  Pt able to don socks and shoes, but unable to fasten shoes this session.  Good sequencing during dressing this session.  Pt remained upright in w/c with seat belt alarm on and all needs in reach.  Therapy Documentation Precautions:  Precautions Precautions: Fall Precaution Comments: language deficits; apraxia Restrictions Weight Bearing Restrictions: No Pain:  Pt with no c/o pain   Therapy/Group: Individual Therapy  [name] 01/21/2020, 9:23 AM

## 2020-01-21 NOTE — Plan of Care (Signed)
  Problem: RH Toileting Goal: LTG Patient will perform toileting task (3/3 steps) with assistance level (OT) Description: LTG: Patient will perform toileting task (3/3 steps) with assistance level (OT)  Flowsheets (Taken 01/21/2020 0842) LTG: Pt will perform toileting task (3/3 steps) with assistance level: (downgraded as pt will require assist due to frequency of incontinence) Minimal Assistance - Patient > 75% Note: downgraded as pt will require assist due to frequency of incontinence

## 2020-01-21 NOTE — Progress Notes (Signed)
Speech Language Pathology Daily Session Note  Patient Details  Name: Ryan Rice MRN: [id number] Date of Birth: 06-16-1951  Today's Date: 01/21/2020 SLP Individual Time: 1005-1058 SLP Individual Time Calculation (min): 53 min  Short Term Goals: Week 2: SLP Short Term Goal 1 (Week 2): Pt will name common objects in 8 out of 10 opportunities with Min A cues. SLP Short Term Goal 2 (Week 2): Pt will answer mildly complex yes/no questions in 8 out of 10 opportunities with Min A cues. SLP Short Term Goal 3 (Week 2): Pt will use multimodal means to communicate basic wants and needs with Min A cues. SLP Short Term Goal 4 (Week 2): Pt will follow 2 step-directions in 8 out of 10 opportunities with Min A cues. SLP Short Term Goal 5 (Week 2): Pt will complete basic problem solving tasks with Min A cues.  Skilled Therapeutic Interventions: Pt was seen for skilled ST intervention targeting aforementioned goals. Pt was pleasant and cooperative. SLP facilitated session by providing min cues for naming common objects. Pt was 70% accurate with objects, and 76% accurate for naming black/white line drawings. Pt answered very simple yes/no questions with 90% accuracy given max multimodal cues (This is a ____. Is this a _____?). Pt followed 2-step commands with 77% accuracy given min cues. Pt was able to communicate his request to have his door closed after treatment ended, and was able to indicate that he was cold and wanted to put his shirt on.  Pt was left in wheelchair with alarm on, all needs within reach. Continue ST per current plan of care.   Pain Pain Assessment Pain Scale: 0-10 Pain Score: 0-No pain  Therapy/Group: Individual Therapy   Coy Vandoren B. Quentin Ore, [facility], CCC-SLP Speech Language Pathologist  Shonna Chock 01/21/2020, 12:58 PM

## 2020-01-21 NOTE — Progress Notes (Signed)
Physical Therapy Session Note  Patient Details  Name: Ryan Rice MRN: [id number] Date of Birth: 19-Nov-1950  Today's Date: 01/21/2020 PT Individual Time: 1330-1446 PT Individual Time Calculation (min): 76 min   Short Term Goals: Week 1:  PT Short Term Goal 1 (Week 1): Pt will be able to perform transfers with CGA PT Short Term Goal 1 - Progress (Week 1): Progressing toward goal PT Short Term Goal 2 (Week 1): Pt will be able to demonstrate functional dynamic standing balane with CGA PT Short Term Goal 2 - Progress (Week 1): Progressing toward goal PT Short Term Goal 3 (Week 1): Pt will be able to gait x 100' wiht min assist PT Short Term Goal 3 - Progress (Week 1): Met Week 2:  PT Short Term Goal 1 (Week 2): STG=LTG due to LOS  Skilled Therapeutic Interventions/Progress Updates:   Received pt sitting in WC finishing lunch, pt agreeable to therapy, and denied any pain during session. Session focused on eating, functional mobility/transfers, LE strength, dynamic standing balance/coordination, ambulation, and improved activity tolerance. Upon entry pt sitting with lunch tray in front of him not eating but a few bites of food missing from plate. Pt reported he was not done eating but did not proceed to continue eating until therapist encouraged him to do so. Pt initially trying to drink peaches from cup and therapist provided min A and encouragement to use fork in R UE to scoop peaches. Pt performed WC mobility 130f using bilateral UEs to ortho gym. Pt performed simulated car transfer without AD min A with verbal cues for RW safety and technique. Pt ambulated 141fon uneven surfaces (ramp) with RW min A/CGA. Pt required max verbal cues to find WC upon returning to chair as pt walked right past it twice. Pt navigated 1 curb x 2 trials with RW and min A. Pt required verbal cues for RW safety, sequencing, and technique. Pt worked on dynamic standing balance constructing pipes x 2 trials with min A.  However, pt required max verbal cues to construct picture. Pt with increased difficulty locating corner connecting pieces and attempted to stack cylinder pieces on top of each other repeatedly. Pt declined ambulating this afternoon, and when asked if pt was tired he stated yes, however was agreeable to working on NuHartford FinancialPt transported to dayroom [facility]. Pt transferred stand<>pivot WC<>Nustep without AD CGA. Pt performed bilateral UE and LE strengthening on Nustep for 10 minutes at level 5. Pt transferred stand<>pivot Nustep<>WC without AD min A and was [facility]. Concluded session with pt sitting in WC, needs within reach, and seatbelt alarm on.    Therapy Documentation Precautions:  Precautions Precautions: Fall Precaution Comments: language deficits; apraxia Restrictions Weight Bearing Restrictions: No  Therapy/Group: Individual Therapy AnAlfonse AlpersT, DPT   01/21/2020, 7:34 AM

## 2020-01-22 ENCOUNTER — Inpatient Hospital Stay (HOSPITAL_COMMUNITY): Payer: Medicare HMO

## 2020-01-22 ENCOUNTER — Inpatient Hospital Stay (HOSPITAL_COMMUNITY): Payer: Medicare HMO | Admitting: Speech Pathology

## 2020-01-22 LAB — GLUCOSE, CAPILLARY
Glucose-Capillary: 142 mg/dL — ABNORMAL HIGH (ref 70–99)
Glucose-Capillary: 174 mg/dL — ABNORMAL HIGH (ref 70–99)
Glucose-Capillary: 191 mg/dL — ABNORMAL HIGH (ref 70–99)
Glucose-Capillary: 251 mg/dL — ABNORMAL HIGH (ref 70–99)

## 2020-01-22 NOTE — Plan of Care (Signed)
  Problem: RH BOWEL ELIMINATION Goal: RH STG MANAGE BOWEL WITH ASSISTANCE Description: STG Manage Bowel with min Assistance. Outcome: Progressing Goal: RH STG MANAGE BOWEL W/MEDICATION W/ASSISTANCE Description: STG Manage Bowel with Medication with min Assistance. Outcome: Progressing   Problem: RH BLADDER ELIMINATION Goal: RH STG MANAGE BLADDER WITH ASSISTANCE Description: STG Manage Bladder With min Assistance Outcome: Progressing   

## 2020-01-22 NOTE — Progress Notes (Signed)
Speech Language Pathology Daily Session Note  Patient Details  Name: Humzah Filla MRN: [id number] Date of Birth: [birth year]  Today's Date: 01/22/2020 SLP Individual Time: [id number] SLP Individual Time Calculation (min): 29 min  Short Term Goals: Week 2: SLP Short Term Goal 1 (Week 2): Pt will name common objects in 8 out of 10 opportunities with Min A cues. SLP Short Term Goal 2 (Week 2): Pt will answer mildly complex yes/no questions in 8 out of 10 opportunities with Min A cues. SLP Short Term Goal 3 (Week 2): Pt will use multimodal means to communicate basic wants and needs with Min A cues. SLP Short Term Goal 4 (Week 2): Pt will follow 2 step-directions in 8 out of 10 opportunities with Min A cues. SLP Short Term Goal 5 (Week 2): Pt will complete basic problem solving tasks with Min A cues.  Skilled Therapeutic Interventions: Pt was seen for skilled ST targeting communication goal. Pt required Min A for accuracy in reading and Mod A for word finding (phonemic and semantic cues) during phrase completion tasks. He named objects from a photo and then extended them to phrase level with use of an orthographic cue of the carrier phrase with 9/10 accuracy. Pt's yes/no responses were only 37% accurate, with intermittent awareness of verbal errors. He required Max A open ended question and semantic function cues to clarify and correct answers. Pt left laying in bed with alarm set and OT arriving for next session. Continue per current plan of care.        Pain Pain Assessment Pain Scale: 0-10 Pain Score: 0-No pain  Therapy/Group: Individual Therapy  Arbutus Leas 01/22/2020, 7:29 AM

## 2020-01-22 NOTE — Progress Notes (Signed)
[facility] PHYSICAL MEDICINE & REHABILITATION PROGRESS NOTE   Subjective/Complaints: Patient seen sitting up in bed, eating breakfast this AM.  He states he slept well overnight.   ROS: Denies CP, SOB, N/V/D  Objective:   No results found. Recent Labs    01/20/20 0624  WBC 6.9  HGB 11.6*  HCT 35.7*  PLT 81*   Recent Labs    01/20/20 0624  NA 132*  K 4.0  CL 108  CO2 17*  GLUCOSE 160*  BUN 28*  CREATININE 1.17  CALCIUM 8.1*    Intake/Output Summary (Last 24 hours) at 01/22/2020 [id number] Last data filed at 01/21/2020 1900 Gross per 24 hour  Intake 450 ml  Output --  Net 450 ml     Physical Exam: Vital Signs Blood pressure (!) 157/59, pulse 67, temperature 98.6 F (37 C), temperature source Oral, resp. rate 14, height 5\' 11"  (1.803 m), weight 89.6 kg, SpO2 100 %. Constitutional: No distress . Vital signs reviewed. HENT: Normocephalic.  Atraumatic. Eyes: EOMI. No discharge. Cardiovascular: No JVD. Respiratory: Normal effort.  No stridor. GI: Non-distended. Skin: Warm and dry.  Intact. Psych: Normal mood.  Normal behavior. Musc: No edema in extremities.  No tenderness in extremities. Neuro: Alert Expressive >>receptive aphasia, continues to improve Motor: RUE/RLE: 5/5 proximal distal LUE/LLE: 4+/5 proximal to distal, unchanged  Assessment/Plan: 1. Functional deficits secondary to L ACA, MCA embolic infracts which require 3+ hours per day of interdisciplinary therapy in a comprehensive inpatient rehab setting.  Physiatrist is providing close team supervision and 24 hour management of active medical problems listed below.  Physiatrist and rehab team continue to assess barriers to discharge/monitor patient progress toward functional and medical goals  Care Tool:  Bathing    Body parts bathed by patient: Right arm, Left arm, Chest, Abdomen, Front perineal area, Right upper leg, Left upper leg, Right lower leg, Left lower leg, Face, Buttocks   Body parts bathed  by helper: Buttocks     Bathing assist Assist Level: Contact Guard/Touching assist     Upper Body Dressing/Undressing Upper body dressing   What is the patient wearing?: Pull over shirt    Upper body assist Assist Level: Set up assist    Lower Body Dressing/Undressing Lower body dressing      What is the patient wearing?: Incontinence brief, Pants     Lower body assist Assist for lower body dressing: Minimal Assistance - Patient > 75%     Toileting Toileting    Toileting assist Assist for toileting: Moderate Assistance - Patient 50 - 74%     Transfers Chair/bed transfer  Transfers assist     Chair/bed transfer assist level: Contact Guard/Touching assist     Locomotion Ambulation   Ambulation assist      Assist level: Contact Guard/Touching assist Assistive device: Walker-rolling Max distance: 121ft   Walk 10 feet activity   Assist     Assist level: Contact Guard/Touching assist Assistive device: Walker-rolling   Walk 50 feet activity   Assist Walk 50 feet with 2 turns activity did not occur: Safety/medical concerns  Assist level: Contact Guard/Touching assist Assistive device: Walker-rolling    Walk 150 feet activity   Assist Walk 150 feet activity did not occur: Safety/medical concerns  Assist level: Contact Guard/Touching assist Assistive device: Walker-rolling    Walk 10 feet on uneven surface  activity   Assist Walk 10 feet on uneven surfaces activity did not occur: Safety/medical concerns   Assist level: Contact Guard/Touching assist Assistive device:  Walker-rolling   Wheelchair     Assist   Type of Wheelchair: Agricultural engineer assist level: Supervision/Verbal cueing Max wheelchair distance: 158ft    Wheelchair 50 feet with 2 turns activity    Assist        Assist Level: Supervision/Verbal cueing   Wheelchair 150 feet activity     Assist      Assist Level: Supervision/Verbal cueing    Blood pressure (!) 157/59, pulse 67, temperature 98.6 F (37 C), temperature source Oral, resp. rate 14, height 5\' 11"  (1.803 m), weight 89.6 kg, SpO2 100 %.  Medical Problem List and Plan: 1.  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  Continue CIR  Team conference today to discuss current and goals and coordination of care, home and environmental barriers, and discharge planning with nursing, case manager, and therapies.  2.  Antithrombotics: -DVT/anticoagulation:  Pharmaceutical: Other (comment)--Eliquis             -antiplatelet therapy: N/A 3. Pain Management: N/A 4. Mood: LCSW to follow for evaluation and support when appropriate.              -antipsychotic agents: N/A 5. Neuropsych: This patient is not capable of making decisions on his own behalf. 6. Skin/Wound Care: Routine pressure relief measures.  7. Fluids/Electrolytes/Nutrition: Monitor I/Os.  8.  HTN: Monitor tid--Labetalol was d/c due to pauses--now on metoprolol, hydralazine and Nifedipine   Remains slightly labile on 4/28             Monitor with increased mobility 9.  CKD s/p renal transplant: Continues to have upward trend BUN and creatinine despite IV fluids for hydration. On prednisone taper to 10 mg daily. Sepra DS MWF,Prograf and Myfortic.              Creatinine 1.17 on 4/26  Continue to monitor 10. Steroid-induced hyperglycemia on T2DM with hyperglycemia: Hgb A1c- 7.1.  Was on metformin, Starlix and Januvia PTA. Monitor blood sugars ac/hs. Continue Lantus 20 units daily and use SSI for elevated BS.    CBG (last 3)  Recent Labs    01/21/20 1641 01/21/20 2135 01/22/20 0631  GLUCAP 235* 224* 142*    Remains labile with steroid taper on 4/28             Monitor with increased mobility 11. Leukocytosis-likely secondary to steroids:?  Resolved  WBC 6.9 on 4/26  Afebrile  UA slightly borderline chest x-ray unremarkable for infection 12.   Hyponatremia  Sodium 132 on 4/26  Recheck in am  13.  Thrombocytopenia  Platelets 81 on 4/26  Continue to monitor. 14.  Pseudomonas UTI  Discussed with pharmacy, cefepime 4/21-4/28  LOS: 12 days A FACE TO FACE EVALUATION WAS PERFORMED  Cypher Paule Lorie Phenix 01/22/2020, 8:32 AM

## 2020-01-22 NOTE — Patient Care Conference (Signed)
Inpatient RehabilitationTeam Conference and Plan of Care Update Date: 01/22/2020   Time: 11:50 AM    Patient Name: Ryan Rice      Medical Record Number: [id number]  Date of Birth: 10/02/1950 Sex: Male         Room/Bed: 4M05C/4M05C-01 Payor Info: Payor: [employer] MEDICARE / Plan: [place] HMO / Product Type: *No Product type* /    Admit Date/Time:  01/10/2020  2:51 PM  Primary Diagnosis:  Embolic infarction [facility])  Patient Active Problem List   Diagnosis Date Noted  . Labile blood pressure   . Acute lower UTI   . Leukocytosis   . Thrombocytopenia ([place])   . Hyponatremia   . Steroid-induced hyperglycemia   . Ischemic stroke of frontal lobe ([place]) 01/10/2020  . Embolic infarction ([place]) 01/10/2020  . Controlled type 2 diabetes mellitus with hyperglycemia, without long-term current use of insulin ([place])   . Renal transplant, status post   . Essential hypertension   . Cerebral embolism with cerebral infarction 01/03/2020  . Brain tumor ([place]) 12/30/2019  . Hip pain, bilateral 12/30/2019  . Elevated PSA 12/30/2019  . Atrial fibrillation, chronic ([place]) 12/30/2019  . Hypomagnesemia 03/20/2019  . Diabetes ([place]) 03/15/2019  . Glaucoma 03/15/2019  . Scleral hemorrhage of left eye 03/15/2019  . Gout flare 03/15/2019  . Debility 03/08/2019  . AKI (acute kidney injury) ([place])   . CAP (community acquired pneumonia)   . Kidney transplant recipient   . Sepsis ([place]) 02/27/2019    Expected Discharge Date: Expected Discharge Date: 01/25/20  Team Members Present: Physician leading conference: Dr. Delice Lesch Care Coodinator Present: Nestor Lewandowsky, RN, BSN, CRRN;Christina Sampson Goon, BSW;Genie Misk [place], RN, MSN Nurse Present: Ellison Carwin, LPN PT Present: Becky Sax, PT OT Present: Mariane Masters, OT SLP Present: Weston Anna, SLP PPS Coordinator present : Ileana Ladd, Burna Mortimer, SLP     Current Status/Progress Goal Weekly Team Focus  Bowel/Bladder   incontinent/continent of b&b. LBM 4/27  train B&B  assess q shift/prn   Swallow/Nutrition/ Hydration             ADL's   Min A to CGA transfers with RW, CGA bathing and LB dressing; does continue to require cues for thoroughness and sequecing with bathing/dressing tasks and way finding with transfers/mobility  Supervision overall, Min A cognition  ADL retraining, activity tolerance, standing balance, cognition, awareness   Mobility   bed mobility supervision, transfers with RW CGA, gait 170ft with RW CGA, 8 steps 2 rails min A  supervision  functional mobility/transfers, LE strength, dynamic standing balance/coordination, ambulation, stair navigation, endurance   Communication   Mod-Max A  Mod A  yes/no accuracy, naming, following commands, expression of wants/needs   Safety/Cognition/ Behavioral Observations  Mod A  Mod A  basic problem solving   Pain   no c/o pain  remain pain free  assess q shift/prn   Skin   abrasion to scrotum  no further breakdown/infection  assess q shift/prn    Rehab Goals Patient on target to meet rehab goals: Yes Rehab Goals Revised: Patt[place] on target with goals *See Care Plan and progress notes for long and short-term goals.     Barriers to Discharge  Current Status/Progress Possible Resolutions Date Resolved   Nursing                  PT  Incontinence                 OT  SLP                SW     Patient on target with goals.          Discharge Planning/Teaching Needs:  Patient/Family goal to dischrage home with family and home care  Will setup family education if reccommended.   Team Discussion: MD UTI, abx last day today, BP up/down, BS up/down, on steroid taper, monitoring labs.  RN inc B/B, BM today, IV fluids.  OT CGA ADLs, min toileting, needs cues for sequencing, apraxia, aphasia.  PT S goals, CGA transfers, 160' RW, min A stairs, fam ed tomorrow.  SLP mod aphasia, cognition mod/max A.   Revisions to Treatment Plan:  N/A     Medical Summary Current Status: Deficits with mobility, transfers, cognition/aphasia secondary to left MCA/ACA embolic infarcts. Weekly Focus/Goal: Improve mobility, hyponatremia, BP, thrombocytopenia, UTI, CBGs  Barriers to Discharge: Medical stability;Other (comments);Incontinence  Barriers to Discharge Comments: expresssive aphasia Possible Resolutions to Barriers: Therapies, abx for UTI, monitor CBGs with steroid taper, follow labs   Continued Need for Acute Rehabilitation Level of Care: The patient requires daily medical management by a physician with specialized training in physical medicine and rehabilitation for the following reasons: Direction of a multidisciplinary physical rehabilitation program to maximize functional independence : Yes Medical management of patient stability for increased activity during participation in an intensive rehabilitation regime.: Yes Analysis of laboratory values and/or radiology reports with any subsequent need for medication adjustment and/or medical intervention. : Yes   I attest that I was present, lead the team conference, and concur with the assessment and plan of the team.   Retta Diones 01/22/2020, 10:53 PM   Team conference was held via web/ teleconference due to [place] - 19

## 2020-01-22 NOTE — Progress Notes (Signed)
Social Work Patient ID: Ryan Rice, male   DOB: 1951-04-02, 69 y.o.   MRN: [id number]  Sw entered room to provide patient with tram conference update, spouse included via telephone. Provided updates from MD, nursing and therapy. Patient and spouse pleased and agree with set d/c. SW will set patient up with [facility], patient has all DME needed. Spouse coming for education tomorrow. Will continue to follow up with any questions or concerns.

## 2020-01-22 NOTE — Progress Notes (Signed)
Physical Therapy Session Note  Patient Details  Name: Ryan Rice MRN: [id number] Date of Birth: May 06, 1951  Today's Date: 01/22/2020 PT Individual Time: 6606-3016 PT Individual Time Calculation (min): 69 min   Short Term Goals: Week 1:  PT Short Term Goal 1 (Week 1): Pt will be able to perform transfers with CGA PT Short Term Goal 1 - Progress (Week 1): Progressing toward goal PT Short Term Goal 2 (Week 1): Pt will be able to demonstrate functional dynamic standing balane with CGA PT Short Term Goal 2 - Progress (Week 1): Progressing toward goal PT Short Term Goal 3 (Week 1): Pt will be able to gait x 100' wiht min assist PT Short Term Goal 3 - Progress (Week 1): Met Week 2:  PT Short Term Goal 1 (Week 2): STG=LTG due to LOS  Skilled Therapeutic Interventions/Progress Updates:   Received pt sitting in WC, pt agreeable to therapy, and denied any pain during session. Session with emphasis on functional mobility/transfers, LE strength, dynamic standing balance/coordination, gait training, and improved activity tolerance. Donned shoes with total assist for time management purposes. Pt performed WC mobility 157f using bilateral UEs and supervision to therapy gym. Pt transported remainder of way [facility]. Pt transferred sit<>stand with RW CGA and donned LiteGait harness with max A while standing. Pt stepped up onto LiteGait with CGA and bilateral UE support. Pt performed the following gait activities on Litegait with bilateral UE support and supervision/CGA:  -3 minutes 38 seconds at 0.580m for [id number] demonstrated increased trunk flexion and required verbal cues for upright posture and increased step length. -3 minutes 25 seconds at 0.5-0.6mp58mn incline level 2 for 165ft40fh the same verbal cues. -lateral side stepping to R for 3 minutes 19 seconds at 0.2mph 12m 55ft. 45fequired verbal and visual cues to increased R step length. Therapist put hand on treadmill to use  as target however pt with poor carry over and increased trunk flexion leaning on railing for support. Pt stepped off of treadmill with CGA and ambulated additional 180ft wi38fW CGA (min A for 2 LOB due to increased R foot drag from fatigue). Overall, pt demonstrated improved stride length and RW safety awareness. Pt required extensive rest break after ambulation. Worked on dynamic sitting balance, attention, awareness, and R UE use copying picture onto pegboard. Pt with increased difficultly reconstructing picture and required min cues to find pegboard, as pt attempting to place pegs on top of picture rather than inside holes. Pt with decreased spatial awareness constructing picture in a vertical pattern instead of horizontal as the picture displayed. However, pt able to alternate correct color of pegs in pattern. Pt [facility]. Concluded session with pt sitting in WC, needs within reach, and seatbelt alarm on.   Therapy Documentation Precautions:  Precautions Precautions: Fall Precaution Comments: language deficits; apraxia Restrictions Weight Bearing Restrictions: No  Therapy/Group: Individual Therapy Ryan Rice   01/22/2020, 7:33 AM

## 2020-01-22 NOTE — Plan of Care (Signed)
  Problem: RH Stairs Goal: LTG Patient will ambulate up and down stairs w/assist (PT) Description: LTG: Patient will ambulate up and down # of stairs with assistance (PT) Flowsheets (Taken 01/22/2020 0735) LTG: Pt will ambulate up/down stairs assist needed:: (downgraded due to decreased safety awareness) Contact Guard/Touching assist LTG: Pt will  ambulate up and down number of stairs: 1 step without rails Note: downgraded due to decreased safety awareness

## 2020-01-22 NOTE — Progress Notes (Signed)
Occupational Therapy Session Note  Patient Details  Name: Ryan Rice MRN: [id number] Date of Birth: 11/01/1950  Today's Date: 01/22/2020 OT Individual Time: [id number] OT Individual Time Calculation (min): 71 min    Short Term Goals: Week 1:     Skilled Therapeutic Interventions/Progress Updates:    1;1. Pt received in bed agreeable to shower level AD finishing up with SLP. Pt completes all functional transfers at ambulatory level with RW with VC for proximity to walker, wider BOS through turns and safe reach back. Pt sits on standard toilet (MOD A to stand from low surface) for BM in toilet (also incontinent in brief). Pt requires MOD A overall for toileting prior to trasnfering into shower on TTB. Pt bathes with S overall and CGA only to stand for buttock hygiene with grab bar. Pt dresses sit to stand at sink with set up for shirt and CGA for pants. Pt requries MAX VC for orinetation of pants d/t dressing apraxia attempting 3x to thread UEs into pant legs. Set up for footwear. S/VC for R visual scanning on sink during oral care with all items on R for R attention. Pt completes dynavision activitiy in standing 1 min trials using RUE (2.6 reaction time avg) for first attempt and LUE (2.8 sec avg reaction) for second trial. Pt with more difficulty locating stimuli in both trials in upper R quadrant needing up to 3.8 seconds. Exited session with pt seated in w/c, exit alarm ona nd call light in reach  Therapy Documentation Precautions:  Precautions Precautions: Fall Precaution Comments: language deficits; apraxia Restrictions Weight Bearing Restrictions: No General:   Vital Signs:  Pain: Pain Assessment Pain Scale: 0-10 Pain Score: 0-No pain ADL: ADL Where Assessed-Eating: Chair Grooming: Minimal assistance Upper Body Bathing: Setup Where Assessed-Upper Body Bathing: Sitting at sink Lower Body Bathing: Moderate assistance Where Assessed-Lower Body Bathing: Wheelchair, Sitting at  sink Upper Body Dressing: Supervision/safety Lower Body Dressing: Moderate assistance Where Assessed-Lower Body Dressing: Sitting at sink Toileting: (total A due to incontience) Toilet Transfer: Minimal assistance Toilet Transfer Method: Stand pivot Vision   Perception    Praxis   Exercises:   Other Treatments:     Therapy/Group: Individual Therapy  [name] 01/22/2020, 10:01 AM

## 2020-01-23 ENCOUNTER — Inpatient Hospital Stay (HOSPITAL_COMMUNITY): Payer: Medicare HMO | Admitting: Occupational Therapy

## 2020-01-23 ENCOUNTER — Encounter (HOSPITAL_COMMUNITY): Payer: Medicare HMO | Admitting: Speech Pathology

## 2020-01-23 ENCOUNTER — Ambulatory Visit (HOSPITAL_COMMUNITY): Payer: Medicare HMO

## 2020-01-23 LAB — GLUCOSE, CAPILLARY
Glucose-Capillary: 137 mg/dL — ABNORMAL HIGH (ref 70–99)
Glucose-Capillary: 161 mg/dL — ABNORMAL HIGH (ref 70–99)
Glucose-Capillary: 230 mg/dL — ABNORMAL HIGH (ref 70–99)
Glucose-Capillary: 117 mg/dL — ABNORMAL HIGH (ref 70–99)

## 2020-01-23 NOTE — Progress Notes (Signed)
Speech Language Pathology Daily Session Note  Patient Details  Name: Ryan Rice MRN: [id number] Date of Birth: 28-Jan-1951  Today's Date: 01/23/2020 SLP Individual Time: 1000-1045 SLP Individual Time Calculation (min): 45 min  Short Term Goals: Week 2: SLP Short Term Goal 1 (Week 2): Pt will name common objects in 8 out of 10 opportunities with Min A cues. SLP Short Term Goal 2 (Week 2): Pt will answer mildly complex yes/no questions in 8 out of 10 opportunities with Min A cues. SLP Short Term Goal 3 (Week 2): Pt will use multimodal means to communicate basic wants and needs with Min A cues. SLP Short Term Goal 4 (Week 2): Pt will follow 2 step-directions in 8 out of 10 opportunities with Min A cues. SLP Short Term Goal 5 (Week 2): Pt will complete basic problem solving tasks with Min A cues.  Skilled Therapeutic Interventions: Skilled treatment session focused on communication goals and completion of family education with the patient's wife. SLP facilitated session by providing supervision level verbal cues for naming of functional items and Min A verbal cues for verbalizing the function of the item at the phrase level. However, overall Mod A multimodal cues were needed during abstract verbal tasks in which he had to compare/contrast between 2 functional items. Patient's wife present during session and educated on patient's current deficits in auditory comprehension and verbal expression and strategies to utilize at home to maximize yes/no accuracy, following commands, verbal expression of wants/needs, naming and types of cueing. She verbalized and demonstrated understanding and handouts were given to reinforce information. Patient left upright in the wheelchair with alarm on and all needs within reach. Continue with current plan of care.      Pain Pain Assessment Pain Scale: 0-10 Pain Score: 0-No pain  Therapy/Group: Individual Therapy  Ryan Rice 01/23/2020, 11:08 AM

## 2020-01-23 NOTE — Progress Notes (Signed)
[place] PHYSICAL MEDICINE & REHABILITATION PROGRESS NOTE   Subjective/Complaints: Patient seen sitting up this AM.  He is working with therapies.  Wife at bedside.  He states he slept well overnight.  Wife with several questions regarding follow-up appointments, medications.  She also discusses right hip pain that been going on for over a year.  Discussed with therapies as well.  Problems have improved off of antibiotics.  ROS: Denies CP, SOB, N/V/D  Objective:   No results found. No results for input(s): WBC, HGB, HCT, PLT in the last 72 hours. No results for input(s): NA, K, CL, CO2, GLUCOSE, BUN, CREATININE, CALCIUM in the last 72 hours.  Intake/Output Summary (Last 24 hours) at 01/23/2020 0851 Last data filed at 01/23/2020 0700 Gross per 24 hour  Intake 120 ml  Output --  Net 120 ml     Physical Exam: Vital Signs Blood pressure (!) 119/58, pulse 63, temperature 98.8 F (37.1 C), temperature source Oral, resp. rate 16, height 5\' 11"  (1.803 m), weight 89.6 kg, SpO2 100 %. Constitutional: No distress . Vital signs reviewed. HENT: Normocephalic.  Atraumatic. Eyes: EOMI. No discharge. Cardiovascular: No JVD. Respiratory: Normal effort.  No stridor. GI: Non-distended. Skin: Warm and dry.  Intact. Psych: Normal mood.  Normal behavior. Musc: No edema in extremities.  No tenderness in extremities. Neuro: Alert Expressive >>receptive aphasia, gradual improvement Motor: RUE/RLE: 5/5 proximal distal LUE/LLE: 4+/5 proximal to distal, unchanged  Assessment/Plan: 1. Functional deficits secondary to L ACA, MCA embolic infracts which require 3+ hours per day of interdisciplinary therapy in a comprehensive inpatient rehab setting.  Physiatrist is providing close team supervision and 24 hour management of active medical problems listed below.  Physiatrist and rehab team continue to assess barriers to discharge/monitor patient progress toward functional and medical goals  Care  Tool:  Bathing    Body parts bathed by patient: Right arm, Left arm, Chest, Abdomen, Front perineal area, Right upper leg, Left upper leg, Right lower leg, Left lower leg, Face, Buttocks   Body parts bathed by helper: Buttocks     Bathing assist Assist Level: Contact Guard/Touching assist     Upper Body Dressing/Undressing Upper body dressing   What is the patient wearing?: Pull over shirt    Upper body assist Assist Level: Set up assist    Lower Body Dressing/Undressing Lower body dressing      What is the patient wearing?: Incontinence brief, Pants     Lower body assist Assist for lower body dressing: Minimal Assistance - Patient > 75%     Toileting Toileting    Toileting assist Assist for toileting: Moderate Assistance - Patient 50 - 74%     Transfers Chair/bed transfer  Transfers assist     Chair/bed transfer assist level: Contact Guard/Touching assist     Locomotion Ambulation   Ambulation assist      Assist level: Supervision/Verbal cueing Assistive device: Lite Gait Max distance: 123ft   Walk 10 feet activity   Assist     Assist level: Supervision/Verbal cueing Assistive device: Lite Gait   Walk 50 feet activity   Assist Walk 50 feet with 2 turns activity did not occur: Safety/medical concerns  Assist level: Supervision/Verbal cueing Assistive device: Lite Gait    Walk 150 feet activity   Assist Walk 150 feet activity did not occur: Safety/medical concerns  Assist level: Supervision/Verbal cueing Assistive device: Lite Gait    Walk 10 feet on uneven surface  activity   Assist Walk 10 feet on  uneven surfaces activity did not occur: Safety/medical concerns   Assist level: Contact Guard/Touching assist Assistive device: Aeronautical engineer   Type of Wheelchair: Manual    Wheelchair assist level: Supervision/Verbal cueing Max wheelchair distance: 137ft    Wheelchair 50 feet with 2 turns  activity    Assist        Assist Level: Supervision/Verbal cueing   Wheelchair 150 feet activity     Assist      Assist Level: Supervision/Verbal cueing   Blood pressure (!) 119/58, pulse 63, temperature 98.8 F (37.1 C), temperature source Oral, resp. rate 16, height 5\' 11"  (1.803 m), weight 89.6 kg, SpO2 100 %.  Medical Problem List and Plan: 1.  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  Continue CIR 2.  Antithrombotics: -DVT/anticoagulation:  Pharmaceutical: Other (comment)--Eliquis             -antiplatelet therapy: N/A 3. Pain Management: N/A  Wife reports chronic right hip pain, however no complaints per patient during rehab hospitalization with minimal with therapies.  Does not seem to affect functional progress letter. 4. Mood: LCSW to follow for evaluation and support when appropriate.              -antipsychotic agents: N/A 5. Neuropsych: This patient is not capable of making decisions on his own behalf. 6. Skin/Wound Care: Routine pressure relief measures.  7. Fluids/Electrolytes/Nutrition: Monitor I/Os.  8.  HTN: Monitor tid--Labetalol was d/c due to pauses--now on metoprolol, hydralazine and Nifedipine   Remains slightly labile on 4/29, continue to monitor with steroid taper             Monitor with increased mobility 9.  CKD s/p renal transplant: Continues to have upward trend BUN and creatinine despite IV fluids for hydration. On prednisone taper to 10 mg daily. Sepra DS MWF,Prograf and Myfortic.              Creatinine 1.17 on 4/26  Continue to monitor 10. Steroid-induced hyperglycemia on T2DM with hyperglycemia: Hgb A1c- 7.1.  Was on metformin, Starlix and Januvia PTA. Monitor blood sugars ac/hs. Continue Lantus 20 units daily and use SSI for elevated BS.    CBG (last 3)  Recent Labs    01/22/20 1706 01/22/20 2113 01/23/20 0622  GLUCAP 191* 251* 137*    Remains labile with steroid taper on  4/29             Monitor with increased mobility 11. Leukocytosis-likely secondary to steroids:?  Resolved  WBC 6.9 on 4/26  Afebrile  UA slightly borderline chest x-ray unremarkable for infection 12.  Hyponatremia  Sodium 132 on 4/26, labs ordered for tomorrow  Recheck in am  13.  Thrombocytopenia  Platelets 81 on 4/26, labs ordered for tomorrow  Continue to monitor. 14.  Pseudomonas UTI  Completed course of cefepime on 4/28  LOS: 13 days A FACE TO FACE EVALUATION WAS PERFORMED  Annora Guderian Lorie Phenix 01/23/2020, 8:51 AM

## 2020-01-23 NOTE — Progress Notes (Signed)
Physical Therapy Session Note  Patient Details  Name: Ryan Rice MRN: [id number] Date of Birth: [birth year]  Today's Date: 01/23/2020 PT Individual Time: 0900-0953 PT Individual Time Calculation (min): 53 min   Short Term Goals: Week 1:  PT Short Term Goal 1 (Week 1): Pt will be able to perform transfers with CGA PT Short Term Goal 1 - Progress (Week 1): Progressing toward goal PT Short Term Goal 2 (Week 1): Pt will be able to demonstrate functional dynamic standing balane with CGA PT Short Term Goal 2 - Progress (Week 1): Progressing toward goal PT Short Term Goal 3 (Week 1): Pt will be able to gait x 100' wiht min assist PT Short Term Goal 3 - Progress (Week 1): Met Week 2:  PT Short Term Goal 1 (Week 2): STG=LTG due to LOS  Skilled Therapeutic Interventions/Progress Updates:   Received pt sitting in [facility] with wife present for family education training, pt agreeable to therapy, and denied any pain. Session with emphasis on family training, discharge planning, functional mobility/transfers, LE strength, dynamic standing balance/coordination, ambulation, stair navigation, simulated car transfers, and improved activity tolerance. Pt transported to ortho gym in [facility] total assist. Pt performed simulated car transfer with RW x 2 trials with close supervision. Trial 1 with therapist and Trial 2 with pt's wife. Therapist educated pt's wife on importance of staying on pt's R side due to weakness and inattention. Pt required verbal cues for RW safety. Pt navigated 1 curb x 4 trials with RW and CGA. Trial 1 with therapist and Trial 2 with pt's wife. Pt required verbal cues for RW safety and technique. Pt transported to rehab apartment and practiced tub transfer using tub transfer bench x 2 trials with RW and CGA. Trial 1 with therapist and Trial 2 with pt's wife. Pt's wife with questions regarding placement of shower curtain and how to avoid getting the floor wet. Therapist educated pt's wife on placing  towels around floor but would refer to OT for any additional tips; pt's wife thankful. Pt ambulated 157f with RW CGA/close supervision back to room with pt's wife. Pt's wife provided verbal cues to increase step length, to attend to R side, and for RW safety. Concluded session with pt sitting in WC, needs within reach, and seatbelt alarm on. Therapist provided pt with fresh ice water.   Therapy Documentation Precautions:  Precautions Precautions: Fall Precaution Comments: language deficits; apraxia Restrictions Weight Bearing Restrictions: No  Therapy/Group: Individual Therapy AAlfonse AlpersPT, DPT   01/23/2020, 7:30 AM

## 2020-01-23 NOTE — Progress Notes (Signed)
Occupational Therapy Session Note  Patient Details  Name: Decklin Weddington MRN: [id number] Date of Birth: [birth year]  Today's Date: 01/23/2020 OT Individual Time: 4301-4840 OT Individual Time Calculation (min): 24 min   OT Individual Time: 1503-1530 OT Individual Time Calculation (min): 27 min   Short Term Goals: Week 2:  OT Short Term Goal 1 (Week 2): STG = LTGs due to remaining LOS  Skilled Therapeutic Interventions/Progress Updates:  Session 1: Patient met seated in wc in agreement with OT treatment session with focus on LUE [place] and cognition. Wife present at bedside. Patient engaged in therapeutic activity with use of peg board requiring multiple choice and question cueing to identify correct color and to recall simple patterns. Patient able to grasp/release pegs and place on board with minor LUE deficits noted in coordination. Session concluded with patient seated in wc with call bell within reach, chair alarm activated, and all needs met.   Session 2: Patient met seated in wc in agreement with afternoon OT treatment session. Total A for wc transport to therapy gym for time management. Sit to stand from wc and stand-pivot transfer with use of RW to mat table with supervision A. Patient engaged in standing therapeutic activity with focus on activity tolerance, standing balance reaching outside of BOS, and R attention with use of sticky notes on mirror. Patient able to locate 10/10 bright pink sticky notes with education on visual scanning from L to R. Wc transport back to room with total A for time management. Session concluded with patient lying supine in bed with call bell within reach, bed alarm activated, and all needs met.   Therapy Documentation Precautions:  Precautions Precautions: Fall Precaution Comments: language deficits; apraxia Restrictions Weight Bearing Restrictions: No  Therapy/Group: Individual Therapy  Male Iafrate R Howerton-Davis 01/23/2020, 12:10 PM

## 2020-01-23 NOTE — Progress Notes (Signed)
Occupational Therapy Session Note  Patient Details  Name: Ryan Rice MRN: [id number] Date of Birth: 12-16-1950  Today's Date: 01/23/2020 OT Individual Time: 0732-0832 OT Individual Time Calculation (min): 60 min    Short Term Goals: Week 2:  OT Short Term Goal 1 (Week 2): STG = LTGs due to remaining LOS  Skilled Therapeutic Interventions/Progress Updates:    Treatment session with focus on hands on education with wife to address self-care retraining, sequencing, and functional mobility.  Pt received upright in bed engaging in breakfast.  Educated pt and pt's wife on recommendations to decrease distractions (ie turn off tv) during eating to increase attention to task as pt easily distracted both internally and externally.  Pt completed bed mobility supervision and ambulated to toilet with RW with CGA.  Discussed recommendation to have pt's wife place [facility] over toilet at home to allow for increased ease with sit > stand from lower toilet seat.  Pt completed at overall Supervision level this session.  Ambulated to tub bench in room.  Discussed recommendation for use of tub transfer bench at home for energy conservation as pt with decreased standing tolerance.  Pt completed bathing at overall supervision level, cues for thoroughness.  Pt completed dressing with setup of clothing items, no apraxia noted this session.  However educated pt's wife on possible instances of apraxia and how to provide cues/assist as needed.  Pt remained upright in w/c with seat belt alarm on and all needs in reach.  Therapy Documentation Precautions:  Precautions Precautions: Fall Precaution Comments: language deficits; apraxia Restrictions Weight Bearing Restrictions: No General:   Vital Signs: Therapy Vitals Temp: 98.8 F (37.1 C) Temp Source: Oral Pulse Rate: 63 Resp: 16 BP: (!) 119/58 Patient Position (if appropriate): Lying Oxygen Therapy SpO2: 100 % O2 Device: Room Air Pain:  Pt with no c/o  pain  Therapy/Group: Individual Therapy  [name] 01/23/2020, 9:24 AM

## 2020-01-23 NOTE — Progress Notes (Signed)
Physical Therapy Discharge Summary  Patient Details  Name: Ryan Rice MRN: [id number] Date of Birth: Aug 17, 1951  Today's Date: 01/24/2020 PT Individual Time: 0915-1024 PT Individual Time Calculation (min): 69 min   Patient has met 10 of 10 long term goals due to improved activity tolerance, improved balance, improved postural control, improved attention, improved awareness and improved coordination.  Patient to discharge at an ambulatory level Supervision. Patient's care partner is independent to provide the necessary physical assistance at discharge. Pt's wife attended family education training on 4/29 and verbalized and demonstrated confidence with all tasks to ensure safe discharge home. Pt and pt's wife with no further questions regarding mobility.   All goals met  Recommendation:  Patient will benefit from ongoing skilled PT services in home health setting to continue to advance safe functional mobility, address ongoing impairments in transfers, LE strength, dynamic standing balance/coordination, ambulation, endurance, and to minimize fall risk.  Equipment: No equipment provided  Reasons for discharge: treatment goals met  Patient/family agrees with progress made and goals achieved: Yes  Today's Interventions: Received pt sitting in WC, pt agreeable to therapy, and denied any pain during session. Session focused on discharge planning, functional mobility/transfers, LE strength, dynamic standing balance, ambulation, stair navigation, and improved activity tolerance. Pt performed WC mobility 175f using bilateral UEs and supervision to ortho gym. Pt ambulated 175fon uneven surfaces (ramp) with RW and CGA. Pt required verbal cues for RW control. Pt performed ambulatory car transfer with RW and supervision with verbal cues for RW safety when turning. Pt picked up small cup from floor using RW and CGA. Pt navigated 12 steps with 2 rails CGA ascending and descending with a step through  pattern. Pt required verbal cues for foot placement on step, stepping sequence, and technique. Pt required multiple rest breaks throughout session due to increased fatigue. Pt transported to dayroom and ambulated [id number] RW and close supervision. Pt required verbal cues to increased R LE step length with fatigue and for RW safety. Pt continues to require cues for R inattention, as pt frequently almost running into obstacles on R side. Pt performed bilateral UE/LE strengthening on Nustep at workload 5 for 10 minutes for a total of 313 steps. Pt worked on dynamic standing balance playing cornhole without UE support and CGA x 3 trials. Pt transported back to room [facility]. Concluded session with pt sitting in WC, needs within reach, and seatbelt alarm on.   PT Discharge Precautions/Restrictions Precautions Precautions: Fall Precaution Comments: dysarthria, mild apraxia Restrictions Weight Bearing Restrictions: No Cognition Overall Cognitive Status: Impaired/Different from baseline Arousal/Alertness: Awake/alert Orientation Level: Oriented X4 Memory: Impaired Awareness: Appears intact Problem Solving: Impaired Safety/Judgment: Impaired Comments: Pt requires cues for safety with mobility; R sided inattention Sensation Sensation Light Touch: Appears Intact Proprioception: Appears Intact Coordination Gross Motor Movements are Fluid and Coordinated: No Fine Motor Movements are Fluid and Coordinated: No Coordination and Movement Description: grossly uncoordinated due to mild R hemiparesis, LE weakness, decreased balance, and R inattention Finger Nose Finger Test: unable to formally test due to apraxia Heel Shin Test: WFLTemecula [facility] Tem[facility]  Motor Motor: Abnormal postural alignment and control;Motor apraxia;Hemiplegia Motor - Skilled Clinical Observations: mild R hemiparesis, mild apraxia (improved since eval), LE weakness, decreased balance, and R inattention  Mobility Bed Mobility Bed  Mobility: Rolling Right;Rolling Left;Sit to Supine;Supine to Sit Rolling Right: Independent Rolling Left: Independent Supine to Sit: Independent Sit to Supine: Independent Transfers Transfers: Sit to Stand;Stand to Sit;Stand Pivot Transfers Sit to  Stand: Supervision/Verbal cueing(RW) Stand to Sit: Supervision/Verbal cueing(RW) Stand Pivot Transfers: Supervision/Verbal cueing(RW) Stand Pivot Transfer Details: Verbal cues for precautions/safety;Verbal cues for safe use of DME/AE Stand Pivot Transfer Details (indicate cue type and reason): verbal cues for RW safety when turning Transfer (Assistive device): Rolling walker Locomotion  Gait Ambulation: Yes Gait Assistance: Supervision/Verbal cueing Gait Distance (Feet): 180 Feet Assistive device: Rolling walker Gait Assistance Details: Verbal cues for precautions/safety;Verbal cues for safe use of DME/AE;Verbal cues for gait pattern Gait Assistance Details: verbal cues for RW safety and increased R LE step length with fatigue Gait Gait: Yes Gait Pattern: Impaired Gait Pattern: Decreased step length - left;Decreased step length - right;Decreased stride length;Poor foot clearance - right;Decreased trunk rotation Gait velocity: decreased Stairs / Additional Locomotion Stairs: Yes Stairs Assistance: Contact Guard/Touching assist Stair Management Technique: Two rails Number of Stairs: 12 Height of Stairs: 6 Ramp: Contact Guard/touching assist(RW) Curb: Contact Guard/Touching assist(RW) Product manager Mobility: Yes Wheelchair Assistance: Chartered loss adjuster: Both upper extremities Wheelchair Parts Management: Needs assistance Distance: 113f  Trunk/Postural Assessment  Cervical Assessment Cervical Assessment: Within Functional Limits Thoracic Assessment Thoracic Assessment: Exceptions to WFL(flexed trunk) Lumbar Assessment Lumbar Assessment: Exceptions to WFL(posterior pelvic  tilt) Postural Control Postural Control: Deficits on evaluation  Balance Balance Balance Assessed: Yes Static Sitting Balance Static Sitting - Balance Support: No upper extremity supported Static Sitting - Level of Assistance: 7: Independent Dynamic Sitting Balance Dynamic Sitting - Balance Support: No upper extremity supported Dynamic Sitting - Level of Assistance: 7: Independent Static Standing Balance Static Standing - Balance Support: Bilateral upper extremity supported(RW) Static Standing - Level of Assistance: 5: Stand by assistance(supervision) Dynamic Standing Balance Dynamic Standing - Balance Support: Bilateral upper extremity supported(RW) Dynamic Standing - Level of Assistance: 5: Stand by assistance(supervision) Extremity Assessment  RLE Assessment RLE Assessment: Exceptions to WMad[facility] Endoscopy CenterGeneral Strength Comments: grossly generalized to 4/5 (unable to formally test DF due to difficutly following commands) LLE Assessment LLE Assessment: Exceptions to WOur Lady Of PeaceGeneral Strength Comments: grossly generalized to 4/5 (unable to formally test DF due to difficutly following commands)   AAlfonse AlpersPT, DPT  01/23/2020, 6:59 PM

## 2020-01-24 ENCOUNTER — Inpatient Hospital Stay (HOSPITAL_COMMUNITY): Payer: Medicare HMO | Admitting: Speech Pathology

## 2020-01-24 ENCOUNTER — Inpatient Hospital Stay (HOSPITAL_COMMUNITY): Payer: Medicare HMO

## 2020-01-24 ENCOUNTER — Inpatient Hospital Stay (HOSPITAL_COMMUNITY): Payer: Medicare HMO | Admitting: Occupational Therapy

## 2020-01-24 DIAGNOSIS — D72819 Decreased white blood cell count, unspecified: Secondary | ICD-10-CM

## 2020-01-24 LAB — CBC WITH DIFFERENTIAL/PLATELET
Abs Immature Granulocytes: 0.04 10*3/uL (ref 0.00–0.07)
Basophils Absolute: 0 10*3/uL (ref 0.0–0.1)
Basophils Relative: 0 %
Eosinophils Absolute: 0.1 10*3/uL (ref 0.0–0.5)
Eosinophils Relative: 2 %
HCT: 31.2 % — ABNORMAL LOW (ref 39.0–52.0)
Hemoglobin: 10.1 g/dL — ABNORMAL LOW (ref 13.0–17.0)
Immature Granulocytes: 1 %
Lymphocytes Relative: 11 %
Lymphs Abs: 0.3 10*3/uL — ABNORMAL LOW (ref 0.7–4.0)
MCH: 27 pg (ref 26.0–34.0)
MCHC: 32.4 g/dL (ref 30.0–36.0)
MCV: 83.4 fL (ref 80.0–100.0)
Monocytes Absolute: 0.3 10*3/uL (ref 0.1–1.0)
Monocytes Relative: 11 %
Neutro Abs: 2.1 10*3/uL (ref 1.7–7.7)
Neutrophils Relative %: 75 %
Platelets: 81 10*3/uL — ABNORMAL LOW (ref 150–400)
RBC: 3.74 MIL/uL — ABNORMAL LOW (ref 4.22–5.81)
RDW: 15.8 % — ABNORMAL HIGH (ref 11.5–15.5)
WBC: 2.8 10*3/uL — ABNORMAL LOW (ref 4.0–10.5)
nRBC: 0 % (ref 0.0–0.2)

## 2020-01-24 LAB — HEPATIC FUNCTION PANEL
ALT: 19 U/L (ref 0–44)
AST: 15 U/L (ref 15–41)
Albumin: 1.7 g/dL — ABNORMAL LOW (ref 3.5–5.0)
Alkaline Phosphatase: 54 U/L (ref 38–126)
Bilirubin, Direct: 0.1 mg/dL (ref 0.0–0.2)
Indirect Bilirubin: 0.6 mg/dL (ref 0.3–0.9)
Total Bilirubin: 0.7 mg/dL (ref 0.3–1.2)
Total Protein: 4.1 g/dL — ABNORMAL LOW (ref 6.5–8.1)

## 2020-01-24 LAB — GLUCOSE, CAPILLARY
Glucose-Capillary: 104 mg/dL — ABNORMAL HIGH (ref 70–99)
Glucose-Capillary: 137 mg/dL — ABNORMAL HIGH (ref 70–99)
Glucose-Capillary: 188 mg/dL — ABNORMAL HIGH (ref 70–99)
Glucose-Capillary: 226 mg/dL — ABNORMAL HIGH (ref 70–99)

## 2020-01-24 LAB — BASIC METABOLIC PANEL
Anion gap: 6 (ref 5–15)
BUN: 23 mg/dL (ref 8–23)
CO2: 18 mmol/L — ABNORMAL LOW (ref 22–32)
Calcium: 7.9 mg/dL — ABNORMAL LOW (ref 8.9–10.3)
Chloride: 106 mmol/L (ref 98–111)
Creatinine, Ser: 1.01 mg/dL (ref 0.61–1.24)
GFR calc Af Amer: >60 mL/min (ref >60)
GFR calc non Af Amer: >60 mL/min (ref >60)
Glucose, Bld: 130 mg/dL — ABNORMAL HIGH (ref 70–99)
Potassium: 3.7 mmol/L (ref 3.5–5.1)
Sodium: 130 mmol/L — ABNORMAL LOW (ref 135–145)

## 2020-01-24 MED ORDER — MAGNESIUM OXIDE 400 MG PO TABS: 400.0000 mg | ORAL_TABLET | Freq: Every day | ORAL | 0 refills | Status: DC

## 2020-01-24 MED ORDER — INSULIN PEN NEEDLE 32G X 6 MM MISC: 1.0000 "application " | Freq: Every day | 0 refills | Status: DC

## 2020-01-24 MED ORDER — FAMOTIDINE 20 MG PO TABS: 20.0000 mg | ORAL_TABLET | Freq: Every day | ORAL | 1 refills | Status: DC

## 2020-01-24 MED ORDER — FINASTERIDE 5 MG PO TABS: 5.0000 mg | ORAL_TABLET | Freq: Every day | ORAL | 0 refills | Status: DC

## 2020-01-24 MED ORDER — METOPROLOL TARTRATE 25 MG PO TABS: 12.5000 mg | ORAL_TABLET | Freq: Two times a day (BID) | ORAL | 0 refills | Status: DC

## 2020-01-24 MED ORDER — TAMSULOSIN HCL 0.4 MG PO CAPS: 0.4000 mg | ORAL_CAPSULE | Freq: Every day | ORAL | 0 refills | Status: DC

## 2020-01-24 MED ORDER — FUROSEMIDE 10 MG/ML IJ SOLN
40.0000 mg | Freq: Once | INTRAMUSCULAR | Status: AC
  Administered 2020-01-24: 40 mg via INTRAVENOUS
  Filled 2020-01-24: qty 4

## 2020-01-24 MED ORDER — APIXABAN 5 MG PO TABS: 5.0000 mg | ORAL_TABLET | Freq: Two times a day (BID) | ORAL | 0 refills | Status: DC

## 2020-01-24 MED ORDER — DOCUSATE SODIUM 100 MG PO CAPS: 100.0000 mg | ORAL_CAPSULE | Freq: Two times a day (BID) | ORAL | 0 refills | Status: DC

## 2020-01-24 MED ORDER — TRAZODONE HCL 50 MG PO TABS: 25.0000 mg | ORAL_TABLET | Freq: Every evening | ORAL | 0 refills | Status: DC | PRN

## 2020-01-24 MED ORDER — NIFEDIPINE ER 60 MG PO TB24: 60.0000 mg | ORAL_TABLET | Freq: Every day | ORAL | 0 refills | Status: DC

## 2020-01-24 MED ORDER — HYDRALAZINE HCL 25 MG PO TABS: 25.0000 mg | ORAL_TABLET | Freq: Four times a day (QID) | ORAL | 0 refills | Status: DC

## 2020-01-24 MED ORDER — INSULIN STARTER KIT- PEN NEEDLES (ENGLISH)
1.0000 | Freq: Once | Status: AC
  Administered 2020-01-25: 11:00:00 1
  Filled 2020-01-24: qty 1

## 2020-01-24 MED ORDER — PREDNISONE 10 MG PO TABS: 10.0000 mg | ORAL_TABLET | Freq: Every day | ORAL | 0 refills | Status: DC

## 2020-01-24 MED ORDER — INSULIN GLARGINE 100 UNIT/ML SOLOSTAR PEN
20.0000 [IU] | PEN_INJECTOR | Freq: Every day | SUBCUTANEOUS | 0 refills | Status: DC
Start: 2020-01-24 — End: 2020-03-17

## 2020-01-24 MED ORDER — ACETAMINOPHEN 325 MG PO TABS: 325.0000 mg | ORAL_TABLET | ORAL | Status: DC | PRN

## 2020-01-24 MED ORDER — ATORVASTATIN CALCIUM 80 MG PO TABS
80.0000 mg | ORAL_TABLET | Freq: Every day | ORAL | 0 refills | Status: DC
Start: 2020-01-24 — End: 2020-05-04

## 2020-01-24 NOTE — Progress Notes (Signed)
Called into room by therapist to assess noticeable edema to patient's BLE. 2+ pitting edema noted to bilateral legs and feet with noted skin tightness. Will make provider aware to assess.

## 2020-01-24 NOTE — Progress Notes (Signed)
Occupational Therapy Discharge Summary  Patient Details  Name: Ryan Rice MRN: [id number] Date of Birth: Aug 02, 1951  Patient has met 12 of 12 long term goals due to improved activity tolerance, improved balance, postural control, ability to compensate for deficits, improved attention and improved awareness.  Patient to discharge at overall Supervision level.  Patient's care partner is independent to provide the necessary cognitive assistance at discharge.  Patient will continue to require supervision due to cognitive impairments with decreased selective attention, impaired emergent awareness, and short term memory deficits. Pt's wife present for hands on education and reports understanding of recommendation for 24/7 supervision for mobility, self-care tasks largely due to impaired cognition.  Reasons goals not met: N/A  Recommendation:  Patient will benefit from ongoing skilled OT services in home health setting to continue to advance functional skills in the area of BADL and Reduce care partner burden.  Equipment: tub transfer bench  Reasons for discharge: treatment goals met and discharge from hospital  Patient/family agrees with progress made and goals achieved: Yes  OT Discharge Precautions/Restrictions  Precautions Precautions: Fall Precaution Comments: dysarthria, mild apraxia Restrictions Weight Bearing Restrictions: No General   Vital Signs Therapy Vitals Temp: 99.4 F (37.4 C) Temp Source: Oral Pulse Rate: 64 Resp: 18 BP: 137/68 Patient Position (if appropriate): Sitting Oxygen Therapy SpO2: 100 % O2 Device: Room Air Pain Pain Assessment Pain Scale: 0-10 Pain Score: 0-No pain ADL ADL Eating: Set up Where Assessed-Eating: Wheelchair Grooming: Independent Where Assessed-Grooming: Sitting at sink Upper Body Bathing: Setup, Supervision/safety Where Assessed-Upper Body Bathing: Shower Lower Body Bathing: Supervision/safety, Setup Where Assessed-Lower Body  Bathing: Shower Upper Body Dressing: Setup, Supervision/safety Where Assessed-Upper Body Dressing: Wheelchair Lower Body Dressing: Supervision/safety Where Assessed-Lower Body Dressing: Sitting at sink, Standing at sink Toileting: Minimal assistance Where Assessed-Toileting: Bedside Commode(BSC over toilet) Toilet Transfer: Close supervision Toilet Transfer Method: Counselling psychologist: Bedside commode(over toilet) Tub/Shower Transfer: Close supervison Clinical cytogeneticist Method: Ambulating, Sit pivot Tub/Shower Equipment: Radio broadcast assistant Vision Baseline Vision/History: Wears glasses Wears Glasses: At all times Patient Visual Report: No change from baseline Vision Assessment?: Vision impaired- to be further tested in functional context Additional Comments: Rt inattention persists with mobility and present with confrontation testing Perception  Perception: Impaired Inattention/Neglect: Does not attend to right visual field Praxis Praxis: Impaired Praxis Impairment Details: Initiation;Ideomotor Cognition Overall Cognitive Status: Impaired/Different from baseline Arousal/Alertness: Awake/alert Orientation Level: Oriented X4 Attention: Selective Sustained Attention: Appears intact Selective Attention: Impaired Selective Attention Impairment: Functional complex Memory: Impaired Memory Impairment: Decreased recall of new information;Decreased short term memory Decreased Short Term Memory: Verbal basic;Functional basic Awareness: Impaired Awareness Impairment: Emergent impairment Problem Solving: Impaired Problem Solving Impairment: Functional basic Reasoning: Impaired Safety/Judgment: Impaired Comments: Pt requires cues for safety with mobility; R sided inattention Sensation Sensation Light Touch: Appears Intact Hot/Cold: Appears Intact Proprioception: Appears Intact Coordination Gross Motor Movements are Fluid and Coordinated: No Fine Motor Movements  are Fluid and Coordinated: No Coordination and Movement Description: grossly uncoordinated due to mild R hemiparesis, LE weakness, decreased balance, and R inattention Finger Nose Finger Test: requires cues for sequencing, able to complete with max cues for sequencing 9 Hole Peg Test: Rt: 42 seconds, Lt: 48 seconds Extremity/Trunk Assessment RUE Assessment RUE Assessment: Within Functional Limits General Strength Comments: 4/5 LUE Assessment LUE Assessment: Within Functional Limits General Strength Comments: 4/5   Harrell Niehoff, [place] 01/24/2020, 3:14 PM

## 2020-01-24 NOTE — Progress Notes (Signed)
[facility] PHYSICAL MEDICINE & REHABILITATION PROGRESS NOTE   Subjective/Complaints: Patient seen sitting up in his chair working with therapies this AM.  He states he slept well overnight. Discussed edema and interventions with therapies and nursing.   ROS: Denies CP, SOB, N/V/D  Objective:   No results found. Recent Labs    01/24/20 0449  WBC 2.8*  HGB 10.1*  HCT 31.2*  PLT 81*   Recent Labs    01/24/20 0449  NA 130*  K 3.7  CL 106  CO2 18*  GLUCOSE 130*  BUN 23  CREATININE 1.01  CALCIUM 7.9*    Intake/Output Summary (Last 24 hours) at 01/24/2020 0852 Last data filed at 01/24/2020 0513 Gross per 24 hour  Intake 420 ml  Output 450 ml  Net -30 ml     Physical Exam: Vital Signs Blood pressure 113/61, pulse 65, temperature 98.7 F (37.1 C), temperature source Oral, resp. rate 18, height 5\' 11"  (1.803 m), weight 90.2 kg, SpO2 100 %. Constitutional: No distress . Vital signs reviewed. HENT: Normocephalic.  Atraumatic. Eyes: EOMI. No discharge. Cardiovascular: No JVD. Respiratory: Normal effort.  No stridor. GI: Non-distended. Skin: Warm and dry.  Intact. Psych: Normal mood.  Normal behavior. Musc: Significant b/l LE pitting edema Neuro: Alert Expressive >>receptive aphasia, gradually improving Motor: RUE/RLE: 5/5 proximal distal LUE/LLE: 4+/5 proximal to distal, stable  Assessment/Plan: 1. Functional deficits secondary to L ACA, MCA embolic infracts which require 3+ hours per day of interdisciplinary therapy in a comprehensive inpatient rehab setting.  Physiatrist is providing close team supervision and 24 hour management of active medical problems listed below.  Physiatrist and rehab team continue to assess barriers to discharge/monitor patient progress toward functional and medical goals  Care Tool:  Bathing    Body parts bathed by patient: Right arm, Left arm, Chest, Abdomen, Front perineal area, Right upper leg, Left upper leg, Right lower leg, Left  lower leg, Face, Buttocks   Body parts bathed by helper: Buttocks     Bathing assist Assist Level: Supervision/Verbal cueing     Upper Body Dressing/Undressing Upper body dressing   What is the patient wearing?: Pull over shirt    Upper body assist Assist Level: Set up assist    Lower Body Dressing/Undressing Lower body dressing      What is the patient wearing?: Incontinence brief, Pants     Lower body assist Assist for lower body dressing: Supervision/Verbal cueing     Toileting Toileting    Toileting assist Assist for toileting: Minimal Assistance - Patient > 75%     Transfers Chair/bed transfer  Transfers assist     Chair/bed transfer assist level: Supervision/Verbal cueing     Locomotion Ambulation   Ambulation assist      Assist level: Contact Guard/Touching assist Assistive device: Walker-rolling Max distance: 11ft   Walk 10 feet activity   Assist     Assist level: Contact Guard/Touching assist Assistive device: Walker-rolling   Walk 50 feet activity   Assist Walk 50 feet with 2 turns activity did not occur: Safety/medical concerns  Assist level: Contact Guard/Touching assist Assistive device: Walker-rolling    Walk 150 feet activity   Assist Walk 150 feet activity did not occur: Safety/medical concerns  Assist level: Contact Guard/Touching assist Assistive device: Walker-rolling    Walk 10 feet on uneven surface  activity   Assist Walk 10 feet on uneven surfaces activity did not occur: Safety/medical concerns   Assist level: Contact Guard/Touching assist Assistive device: Walker-rolling  Wheelchair     Assist   Type of Wheelchair: Agricultural engineer assist level: Supervision/Verbal cueing Max wheelchair distance: 172ft    Wheelchair 50 feet with 2 turns activity    Assist        Assist Level: Supervision/Verbal cueing   Wheelchair 150 feet activity     Assist      Assist Level:  Supervision/Verbal cueing   Blood pressure 113/61, pulse 65, temperature 98.7 F (37.1 C), temperature source Oral, resp. rate 18, height 5\' 11"  (1.803 m), weight 90.2 kg, SpO2 100 %.  Medical Problem List and Plan: 1.  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  Continue CIR 2.  Antithrombotics: -DVT/anticoagulation:  Pharmaceutical: Other (comment)--Eliquis             -antiplatelet therapy: N/A 3. Pain Management: N/A  Wife reports chronic right hip pain, however no complaints per patient during rehab hospitalization with minimal with therapies.  Does not seem to affect functional progress letter. 4. Mood: LCSW to follow for evaluation and support when appropriate.              -antipsychotic agents: N/A 5. Neuropsych: This patient is not capable of making decisions on his own behalf. 6. Skin/Wound Care: Routine pressure relief measures.  7. Fluids/Electrolytes/Nutrition: Monitor I/Os.  8.  HTN: Monitor tid--Labetalol was d/c due to pauses--now on metoprolol, hydralazine and Nifedipine   Remains slightly labile on 4/29, continue to monitor with steroid taper             Monitor with increased mobility 9.  CKD s/p renal transplant: Continues to have upward trend BUN and creatinine despite IV fluids for hydration. On prednisone taper to 10 mg daily PTA. Sepra DS MWF,Prograf and Myfortic.              Creatinine 1.1 on 4/30  Will consult Nephro given increase in edema  Continue to monitor 10. Steroid-induced hyperglycemia on T2DM with hyperglycemia: Hgb A1c- 7.1.  Was on metformin, Starlix and Januvia PTA. Monitor blood sugars ac/hs. Continue Lantus 20 units daily and use SSI for elevated BS.    CBG (last 3)  Recent Labs    01/23/20 1633 01/23/20 2101 01/24/20 0632  GLUCAP 230* 117* 104*    Labile, but improving with steroid taper on 4/30             Monitor with increased mobility 11. Leukocytosis, now with leukopenia   WBC  2.8 on 4/30  Afebrile  UA slightly borderline chest x-ray unremarkable for infection 12.  Hyponatremia  Sodium 130 on 4/30  Recheck in am  13.  Thrombocytopenia  Platelets 81 on 4/30  Continue to monitor. 14.  Pseudomonas UTI  Completed course of cefepime on 4/28  LOS: 14 days A FACE TO FACE EVALUATION WAS PERFORMED  Ryan Rice Lorie Phenix 01/24/2020, 8:52 AM

## 2020-01-24 NOTE — Progress Notes (Signed)
Social Work Discharge Note   The overall goal for the admission was met for:   Discharge location: Yes, Home  Length of Stay: Yes, 15 Days  Discharge activity level: Yes  Home/community participation: Yes  Services provided included: MD, RD, PT, OT, SLP, RN, CM, TR, Pharmacy and SW  Financial Services: Private Insurance: [employer]  Follow-up services arranged: Home Health: [facility]  Comments (or additional information): PT, OT, SLP  Patient/Family verbalized understanding of follow-up arrangements: Yes  Individual responsible for coordination of the follow-up plan: Spouse, [phone]  Confirmed correct DME delivered: Christina J Baskerville 01/24/2020    Christina J Baskerville 

## 2020-01-24 NOTE — Consult Note (Signed)
[facility] Initial Consult Note  [facility] PCP: Drosinis, Pamalee Leyden, PA-C Admit Date: 01/10/2020 Requesting Physician: Jamse Arn, MD Reason for Consult: Volume overload Hospital admission: 12/29/19-01/10/20 CIR: 01/10/20 - present   Assessment/ Plan:   Assessment & Plan  This is a 69 year old male with past medical history significant for ESRD s/p renal transplant, on prednisone, Septra, Prograf, Myfortic.  Patient is hospitalized for intensive rehab after left ACA and MCA embolic infarcts.  His current deficits include mobility, transfers, cognition/aphasia.  1. Acute B/L Lower Extremity Edema  Per primary team, patient is overall doing well with his therapy and is planning on discharge tomorrow however, was noted to have significant increase in edema overnight.  No new medications, however, he is currently on a steroid taper from 60 mg (4/17), tapering by 10 mg every 3 days. His creatinine and BUN have had significant improvement since admission to CIR. UOP charted - yellow, clear.  Normal echocardiogram on 01/03/20.  Patient reports that swelling started about 4 days ago. Patient denies any decreased urination, heart palpitations, chest pain, shortness of breath.  Patient reports this has never happened previously. On exam, 3+soft, nonpainful, pitting edema to the knees bilaterally. No lasix on medication list, on BPH medications.  - add on LFT to this morning's labs  - elevate lower extremity  - bladder scan if low UOP  - post void residual with hx of BPH, I&O cath if >300cc 2. Renal Transplant Patient (2013) Medications include Prograf 2 mg BID, Myfortic 360 mg twice daily, prednisone taper 3. UTI - pseudomonas  On Cefepime since 4/21. Concurrent chronic UTI ppx with bactrim 3x weekly   Medications:   Home Meds:  Current Outpatient Medications  Medication Instructions  . apixaban (ELIQUIS) 5 mg, Oral, 2 times daily  . artificial tears (LACRILUBE) OINT ophthalmic  ointment Left Eye, At bedtime PRN  . atorvastatin (LIPITOR) 80 mg, Oral, Daily-1800  . brimonidine (ALPHAGAN) 0.2 % ophthalmic solution 1 drop, Right Eye, 2 times daily  . cinacalcet (SENSIPAR) 60 mg, Oral, Daily  . diclofenac Sodium (VOLTAREN) 4 g, Topical, 2 times daily, Apply to hips and legs  . docusate sodium (COLACE) 100 mg, Oral, 2 times daily  . dorzolamide-timolol (COSOPT) 22.3-6.8 MG/ML ophthalmic solution 1 drop, Right Eye, 2 times daily  . famotidine (PEPCID) 20 mg, Oral, Daily  . finasteride (PROSCAR) 5 mg, Oral, Daily  . folic acid (FOLVITE) 1 MG tablet TAKE 1 TABLET BY MOUTH EVERY DAY  . hydrALAZINE (APRESOLINE) 25 mg, Oral, Every 6 hours  . Januvia 100 mg, Oral, Daily  . magnesium oxide (MAG-OX) 400 mg, Oral, Daily  . metFORMIN (GLUCOPHAGE-XR) 500 mg, Oral, 2 times daily  . metoprolol tartrate (LOPRESSOR) 12.5 mg, Oral, 2 times daily  . mycophenolate (MYFORTIC) 360 mg, Oral, 2 times daily  . nateglinide (STARLIX) 120 mg, Oral, 2 times daily before meals, On hold if BG is < 130  . NIFEdipine (ADALAT CC) 60 mg, Oral, Daily  . pioglitazone (ACTOS) 15 mg, Oral, Daily  . predniSONE (DELTASONE) 50 MG tablet Take 6 tablets (60 mg total) by mouth daily with breakfast for 3 days, THEN 5 tablets (50 mg total) daily with breakfast for 3 days, THEN 4 tablets (40 mg total) daily with breakfast for 3 days, THEN 3 tablets (30 mg total) daily with breakfast for 3 daysTHEN 2 tablets ( 20 mg total ) for 3 days and then 1 tablet 10 mg for three days.  . predniSONE (DELTASONE) 40  mg, Oral, Daily with breakfast  . predniSONE (DELTASONE) 30 mg, Oral, Daily with breakfast  . predniSONE (DELTASONE) 20 mg, Oral, Daily with breakfast  . sulfamethoxazole-trimethoprim (BACTRIM) 400-80 MG tablet 1 tablet, Oral, Every M-W-F  . tacrolimus (PROGRAF) 2 mg, Oral, 2 times daily  . tamsulosin (FLOMAX) 0.4 mg, Oral, Daily after breakfast    Inpatient Meds: Scheduled Meds: . apixaban  5 mg Oral BID  .  atorvastatin  80 mg Oral q1800  . brimonidine  1 drop Right Eye BID  . cinacalcet  60 mg Oral Q breakfast  . docusate sodium  100 mg Oral BID  . dorzolamide-timolol  1 drop Right Eye BID  . famotidine  20 mg Oral Daily  . finasteride  5 mg Oral Daily  . folic acid  1 mg Oral Daily  . hydrALAZINE  25 mg Oral Q6H  . insulin aspart  0-15 Units Subcutaneous TID WC  . insulin aspart  0-5 Units Subcutaneous QHS  . insulin glargine  20 Units Subcutaneous Daily  . magnesium oxide  400 mg Oral Daily  . metoprolol tartrate  12.5 mg Oral BID  . mycophenolate  360 mg Oral BID  . NIFEdipine  60 mg Oral Daily  . predniSONE  10 mg Oral Q breakfast  . sulfamethoxazole-trimethoprim  1 tablet Oral Q M,W,F  . tacrolimus  2 mg Oral BID  . tamsulosin  0.4 mg Oral QPC breakfast   Continuous Infusions: PRN Meds:.acetaminophen, alum & mag hydroxide-simeth, artificial tears, bisacodyl, diphenhydrAMINE, guaiFENesin-dextromethorphan, HYDROcodone-acetaminophen, ondansetron **OR** ondansetron (ZOFRAN) IV, polyethylene glycol, sodium phosphate, traZODone, zolpidem  Subjective:   HPI:  Ryan Rice is a 69 y.o. male with pmhx s/f A. fib on Eliquis, diabetes, DVT, ESRD status post kidney transplant 2013, peripheral vascular disease who was admitted to CIR for further rehabilitation after embolic stroke involving left ACA and MCA.  Patient reports that the swelling came up overnight.  This is never happened previously.  He does not have any pain, numbness, tingling.  He denies chest pain, shortness of breath, palpitations.  ROS Balance of 12 systems is negative w/ exceptions as above  PMH  Past Medical History:  Diagnosis Date  . Allergic rhinitis   . BPH (benign prostatic hyperplasia)   . Chronic atrial fibrillation ([place])   . Diabetes mellitus ([place])   . DVT (deep venous thrombosis) ([facility])    LLE  . Hematochezia   . HTN (hypertension)   . Hypomagnesemia 03/20/2019  . Kidney transplant recipient   . PVD  (peripheral vascular disease) ([place])    [place]  Past Surgical History:  Procedure Laterality Date  . KIDNEY TRANSPLANT     FH  Family History  Problem Relation Age of Onset  . Stroke Mother   . Heart disease Father   . Diabetes Father   . Diabetes Sister   . Diabetes Brother   . Heart disease Brother    [place]  reports that he quit smoking about 35 years ago. His smoking use included cigarettes. He has never used smokeless tobacco. He reports current alcohol use. He reports that he does not use drugs. Allergies  Allergies  Allergen Reactions  . Iodinated Diagnostic Agents Hives  . Tape Hives    Objective:   Physical Exam   BP 113/61 (BP Location: Right Arm)   Pulse 65   Temp 98.7 F (37.1 C) (Oral)   Resp 18   Ht 5\' 11"  (1.803 m)   Wt 90.2 kg   SpO2 100%  BMI 27.73 kg/m  Filed Weights   01/17/20 1515 01/21/20 1515 01/23/20 1515  Weight: 89.8 kg 89.6 kg 90.2 kg   Intake/Output      04/29 0701 - 04/30 0700 04/30 0701 - 05/01 0700   P.O. 420    Total Intake(mL/kg) 420 (4.7)    Urine (mL/kg/hr) 450 (0.2)    Total Output 450    Net -30         Urine Occurrence 2 x 1 x   Stool Occurrence  1 x      GEN: Well-appearing male sitting up in wheelchair watching TV ENT:  EYES: Anicteric, noninjected, PERRLA, EOMI CV: Irregular rhythm.  1/6 systolic ejection murmur best heard at left lower sternal border.  No JVD appreciated PULM: Clear to auscultation bilaterally.  Stable on room air ABD: Soft, nontender nondistended.  Positive bowel sounds SKIN: No obvious rashes, lesions, discharge, weeping EXT: 3+ pitting edema in bilateral lower extremities to the knees.  Nonpainful, soft.   Labs: CBC Recent Labs  Lab 01/20/20 0624 01/24/20 0449  WBC 6.9 2.8*  NEUTROABS  --  2.1  HGB 11.6* 10.1*  HCT 35.7* 31.2*  MCV 82.4 83.4  PLT 81* 81*   Basic Metabolic Panel Recent Labs  Lab 01/20/20 0624 01/24/20 0449  NA 132* 130*  K 4.0 3.7  CL 108 106  CO2 17* 18*  GLUCOSE  160* 130*  BUN 28* 23  CREATININE 1.17 1.01  CALCIUM 8.1* 7.9*    Creatinine, Ser (mg/dL)  Date Value  01/24/2020 1.01  01/20/2020 1.17  01/17/2020 1.44 (H)  01/13/2020 1.24  01/12/2020 1.52 (H)  01/11/2020 1.80 (H)  01/10/2020 1.69 (H)  01/09/2020 1.33 (H)  01/08/2020 1.26 (H)  01/07/2020 1.18  01/07/2020 1.19    Pertinent Imaging:  No results found.  Zettie Cooley, MD [place] Resident, PGY2 01/24/2020, 10:52 AM

## 2020-01-24 NOTE — Progress Notes (Signed)
Speech Language Pathology Discharge Summary  Patient Details  Name: Ryan Rice MRN: [id number] Date of Birth: 19-Feb-1951  Today's Date: 01/24/2020 SLP Individual Time: 1130-1155 SLP Individual Time Calculation (min): 25 min   Skilled Therapeutic Interventions:  Skilled treatment session focused on speech goals. Patient followed 1-step commands with 100% accuracy, however, accuracy decreased to 66% with 2 step commands. Patient also answered basic yes/no questions with 90% accuracy and complex yes/no questions with 40% accuracy.  Patient was able to name functional items and completed responsive naming tasks with 100% accuracy. Max A multimodal cues were needed to generate answers to orientation questions. Patient left upright in the wheelchair with alarm on and all needs within reach. Continue with current plan of care.   Patient has met 4 of 4 long term goals.  Patient to discharge at overall Mod level.   Reasons goals not met: N/A   Clinical Impression/Discharge Summary: Patient has made functional gains and has met 4 of 4 this admission. Currently, patient requires overall Min-Mod A multimodal cues for comprehension of basic information and verbal expression of basic, daily information at the phrase level. Patient also demonstrates improved naming and ability to complete automatic sequences. Overall Mod A multimodal cues are also needed for patient to complete functional and familiar tasks regarding basic problem solving. Patient and family education is complete and patient will discharge home with 24 hour supervision from family. Patient would benefit from f/u SLP services to maximize his cognitive-linguistic function in order to maximize his functional independence and reduce caregiver burden.   Care Partner:  Caregiver Able to Provide Assistance: Yes  Type of Caregiver Assistance: Physical;Cognitive  Recommendation:  Home Health SLP;24 hour supervision/assistance  Rationale for SLP  Follow Up: Maximize cognitive function and independence;Reduce caregiver burden;Maximize functional communication   Equipment: N/A   Reasons for discharge: Discharged from hospital;Treatment goals met   Patient/Family Agrees with Progress Made and Goals Achieved: Yes    Patti Shorb, [place] 01/24/2020, 6:30 AM

## 2020-01-24 NOTE — Progress Notes (Signed)
Occupational Therapy Session Note  Patient Details  Name: Ryan Rice MRN: [id number] Date of Birth: Apr 27, 1951  Today's Date: 01/24/2020 OT Individual Time: [phone] and [id number] OT Individual Time Calculation (min): 60 min and 42 min   Short Term Goals: Week 2:  OT Short Term Goal 1 (Week 2): STG = LTGs due to remaining LOS  Skilled Therapeutic Interventions/Progress Updates:    1) Completed ADL retraining at overall Supervision level.  Pt received upright in bed having completed breakfast.  Pt agreeable to shower.  Completed bed mobility at supervision level, noted increased swelling (piting edema) in BLE Rt > Lt - notified RN.  Pt ambulated to toilet with RW with supervision.  Pt incontinent of bladder prior to exiting bed.  Pt completed toileting at overall supervision level, required assist for hygiene post BM.  Ambulated to room shower with RW supervision.  Completed bathing at sit > stand level with supervision.  Dressing completed with setup assist, pt with no apraxia this session when dressing.  Completed oral care in sitting for energy conservation, utilizing BUE to obtain items and apply toothpaste to toothbrush with LUE.  Pt remained upright in w/c with seat belt alarm on, elevating leg rests for edema management, and all needs in reach.  2) Treatment session with focus on functional transfers, Rt attention, and BUE fine motor control.  Pt received upright in w/c finishing lunch.  Pt agreeable to therapy session.  Engaged in functional transfers in ADL apt with RW.  Pt completed tub/shower transfer with tub transfer bench and RW with supervision.  Completed toilet transfer to [facility] over toilet with RW with supervision.  Noted pt to have been incontinent of urine.  Encouraged pt to complete hygiene and change clothing as his clothes were soaked.  Returned to room, engaged in hygiene at sit > stand level with supervision/setup and cues for for thoroughness.  Completed 9 hole peg test:  Rt: 42 seconds, Lt: 48 seconds with improved fine motor control.  Pt continues to demonstrate Rt inattention, especially with confrontation testing.  Educated on Rt inattention and implications in functional mobility and tasks.  Pt returned to upright in w/c with legs elevated for edema management, seat belt alarm on, and all needs in reach.  Therapy Documentation Precautions:  Precautions Precautions: Fall Precaution Comments: dysarthria, mild apraxia Restrictions Weight Bearing Restrictions: No General:   Vital Signs: Therapy Vitals Temp: 98.7 F (37.1 C) Temp Source: Oral Pulse Rate: 65 Resp: 18 BP: 113/61 Patient Position (if appropriate): Lying Oxygen Therapy SpO2: 100 % O2 Device: Room Air Pain: Pt with no c/o pain during AM or PM session  Therapy/Group: Individual Therapy  [name] 01/24/2020, 8:24 AM

## 2020-01-24 NOTE — Discharge Summary (Addendum)
Physician Discharge Summary  Patient ID: Ryan Rice MRN: [id number] DOB/AGE: September 29, 1950 69 y.o.  Admit date: 01/10/2020 Discharge date: 01/25/2020  Discharge Diagnoses:  Principal Problem:   Embolic infarction [facility]) Active Problems:   Controlled type 2 diabetes mellitus with hyperglycemia, without long-term current use of insulin ([place])   Renal transplant, status post   Essential hypertension   Leukocytosis   Thrombocytopenia (HCC)   Hyponatremia   Steroid-induced hyperglycemia   Acute lower UTI   Leukopenia   Peripheral edema   Discharged Condition: stable   Significant Diagnostic Studies:   DG CHEST PORT 1 VIEW  Result Date: 01/11/2020 CLINICAL DATA:  Leukocytosis, CVA EXAM: PORTABLE CHEST 1 VIEW COMPARISON:  03/02/2019 chest radiograph. FINDINGS: Left rotated chest radiograph. Stable cardiomediastinal silhouette with moderate cardiomegaly. No pneumothorax. No pleural effusion. Cephalization of the pulmonary vasculature without overt pulmonary edema. No acute consolidative airspace disease. IMPRESSION: Stable moderate cardiomegaly without overt pulmonary edema. No active pulmonary disease. Electronically Signed   By: Ilona Sorrel M.D.   On: 01/11/2020 13:21     Labs:  Basic Metabolic Panel: BMP Latest Ref Rng & Units 01/24/2020 01/20/2020 01/17/2020  Glucose 70 - 99 mg/dL 130(H) 160(H) 222(H)  BUN 8 - 23 mg/dL 23 28(H) 41(H)  Creatinine 0.61 - 1.24 mg/dL 1.01 1.17 1.44(H)  Sodium 135 - 145 mmol/L 130(L) 132(L) 132(L)  Potassium 3.5 - 5.1 mmol/L 3.7 4.0 4.2  Chloride 98 - 111 mmol/L 106 108 107  CO2 22 - 32 mmol/L 18(L) 17(L) 17(L)  Calcium 8.9 - 10.3 mg/dL 7.9(L) 8.1(L) 8.2(L)    CBC: CBC Latest Ref Rng & Units 01/24/2020 01/20/2020 01/14/2020  WBC 4.0 - 10.5 K/uL 2.8(L) 6.9 15.6(H)  Hemoglobin 13.0 - 17.0 g/dL 10.1(L) 11.6(L) 13.3  Hematocrit 39.0 - 52.0 % 31.2(L) 35.7(L) 41.2  Platelets 150 - 400 K/uL 81(L) 81(L) 112(L)     Brief HPI:   Ryan Rice is a 69 y.o.  male with history of PVD, CAF, BPH, HTN, renal transplant who was admitted in 12/30/2019 with mental status changes and speech difficulty.  He was also noted to have staring episodes with stuttering speech.  Patient was on chronic Coumadin which had been held in anticipation of GU procedure.  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  EEG was negative for seizures and he was started on steroids for cerebral edema.  Dr. Cyndy Freeze was consulted with plans for biopsy on 04/08.  However, follow-up MRI on 04/08 showed stable left frontal lobe lesion with hyperintense FLAIR suggesting blood products and new confluent area of diffusion abnormality left hemisphere gyriform with question of subacute infarct with petechial hemorrhage.  [name] felt that patient with left ACA infarct with hemorrhagic conversion as well as left MCA infarct that was embolic due to A. fib and lack of Coumadin.  Follow-up MRI showed stable evolving infarcts and MRV brain was negative for venous sinus thrombosis.  He was started on Eliquis for secondary stroke prevention. Hospital course was significant for worsening of cognitive status and follow-up CT head showed no acute changes.  Nephrology consulted for acute on chronic renal failure with hyperkalemia which resolved with IV fluids.  Renal ultrasound showed stable appearance of transplanted kidney without hydronephrosis.  Steroids  were being tapered to home dose.  Patient continued to be limited by expressive/receptive aphasia, balance deficits and apraxia affecting functional status.  CIR was recommended due to functional decline   Hospital Course: Ryan Jew  Rice was admitted to rehab 01/10/2020 for inpatient therapies to consist of PT, ST and OT at least three hours five days a week. Past admission physiatrist, therapy team and rehab RN have worked together to provide customized collaborative  inpatient rehab.  He continues on Eliquis and is tolerating this without side effects.  Follow-up labs showed thrombocytopenia and neutropenia.  Diabetes has been monitored with ac/hs CBG checks and SSI was use prn for tighter BS control.  Blood sugars are improving with taper of prednisone to home dose of 10 mg a day.  He is to continue on insulin at this time.  Wife has been educated on insulin management as well as dietary restrictions. He has had issues with chronic right hip pain with this has not affected his functional progress during therapy.    Blood pressures were monitored on.  3 times daily basis and is slowly improving.  He did develop 2+ peripheral edema and nephrology was consulted for input.  He was treated with IV diuresis x2 with recommendations to start 40 mg of Lasix Monday Wednesday Friday and follow-up with nephrology for further adjustment. He developed leukocytosis with rise in WBC-to 26,000 due to Pseudomonas UTI and was treated with 1 week course 7 day course of cefepime.  Follow-up CBC shows leukocytosis has resolved and now with leukopenia.  Recommend repeat CBC/be met by PCP in a week past discharge. He is continent of bowel and bladder. He has made gains during rehab stay and currently requires supervision due to short-term memory deficits and decrease in awareness/attention.  He  will continue to receive follow up [place], [place] and [place] by  [facility]  after discharge   Rehab course: During patient's stay in rehab weekly team conferences were held to monitor patient's progress, set goals and discuss barriers to discharge. At admission, patient required min to mod assist with mobility and with ADL tasks.  He exhibited expressive greater than receptive language deficits with anomia, phenomena paraphasias and semantic paraphasias.  He was able to produce automatic phrases without word finding deficits but was unable to answer basic questions and had deficits in following one-step  commands as well as decreased ability to answer yes/no questions.  He  has had improvement in activity tolerance, balance, postural control as well as ability to compensate for deficits.  He is able to complete ADL tasks with supervision.  He requires supervision for transfers and to ambulate 180 feet with cues for safety due to right inattention. He is able to follow one-step command with 100% accuracy, answer basic yes/no questions with 90% accuracy and complex yes/no questions with 40% accuracy.  He requires max multimodal cues to generate answers to orientation questions.  He requires mod cognitive assistance at this time.  Family education was completed regarding all aspects of safety, cognition and mobility.  Disposition: Home  Diet: Carb Modified.   Special Instructions: 1. Monitor BS ac/hs. Follow up with PCP for adjustment of insulin. 2.  Recommend recheck BMET to monitor renal status.  3. Recheck CBC in one week to monitor WBC/platelets.     Discharge Instructions     Ambulatory referral to Physical Medicine Rehab   Complete by: As directed    1-2 weeks TC appt      Allergies as of 01/25/2020       Reactions   Iodinated Diagnostic Agents Hives   Tape Hives        Medication List     STOP taking these  medications    Januvia 100 MG tablet Generic drug: sitaGLIPtin   metFORMIN 500 MG 24 hr tablet Commonly known as: GLUCOPHAGE-XR   nateglinide 120 MG tablet Commonly known as: STARLIX   pioglitazone 30 MG tablet Commonly known as: ACTOS       TAKE these medications    acetaminophen 325 MG tablet Commonly known as: TYLENOL Take 1-2 tablets (325-650 mg total) by mouth every 4 (four) hours as needed for mild pain.   apixaban 5 MG Tabs tablet Commonly known as: ELIQUIS Take 1 tablet (5 mg total) by mouth 2 (two) times daily.   artificial tears Oint ophthalmic ointment Commonly known as: LACRILUBE Place into the left eye at bedtime as needed for dry eyes.    atorvastatin 80 MG tablet Commonly known as: LIPITOR Take 1 tablet (80 mg total) by mouth daily at 6 PM.   brimonidine 0.2 % ophthalmic solution Commonly known as: ALPHAGAN Place 1 drop into the right eye 2 (two) times daily.   cinacalcet 60 MG tablet Commonly known as: SENSIPAR Take 60 mg by mouth daily.   diclofenac Sodium 1 % Gel Commonly known as: VOLTAREN Apply 4 g topically 2 (two) times daily. Apply to hips and legs   docusate sodium 100 MG capsule Commonly known as: COLACE Take 1 capsule (100 mg total) by mouth 2 (two) times daily.   dorzolamide-timolol 22.3-6.8 MG/ML ophthalmic solution Commonly known as: COSOPT Place 1 drop into the right eye 2 (two) times a day.   famotidine 20 MG tablet Commonly known as: PEPCID Take 1 tablet (20 mg total) by mouth daily.   finasteride 5 MG tablet Commonly known as: PROSCAR Take 1 tablet (5 mg total) by mouth daily.   folic acid 1 MG tablet Commonly known as: FOLVITE TAKE 1 TABLET BY MOUTH EVERY DAY   furosemide 40 MG tablet Commonly known as: Lasix Take 1 tablet (40 mg total) by mouth every Monday, Wednesday, and Friday.   hydrALAZINE 25 MG tablet Commonly known as: APRESOLINE Take 1 tablet (25 mg total) by mouth every 6 (six) hours.   insulin glargine 100 UNIT/ML Solostar Pen Commonly known as: LANTUS Inject 20 Units into the skin daily.   Insulin Pen Needle 32G X 6 MM Misc 1 application by Does not apply route daily.   magnesium oxide 400 MG tablet Commonly known as: MAG-OX Take 1 tablet (400 mg total) by mouth daily.   metoprolol tartrate 25 MG tablet Commonly known as: LOPRESSOR Take 0.5 tablets (12.5 mg total) by mouth 2 (two) times daily.   mycophenolate 180 MG EC tablet Commonly known as: MYFORTIC Take 360 mg by mouth 2 (two) times daily.   NIFEdipine 60 MG 24 hr tablet Commonly known as: ADALAT CC Take 1 tablet (60 mg total) by mouth daily.   predniSONE 10 MG tablet Commonly known as:  DELTASONE Take 1 tablet (10 mg total) by mouth daily with breakfast. What changed:  medication strength how much to take Another medication with the same name was removed. Continue taking this medication, and follow the directions you see here.   sulfamethoxazole-trimethoprim 400-80 MG tablet Commonly known as: BACTRIM Take 1 tablet by mouth every Monday, Wednesday, and Friday.   tacrolimus 1 MG capsule Commonly known as: PROGRAF Take 2 mg by mouth 2 (two) times daily.   tamsulosin 0.4 MG Caps capsule Commonly known as: FLOMAX Take 1 capsule (0.4 mg total) by mouth daily after breakfast.   traZODone 50 MG tablet Commonly known as:  DESYREL Take 0.5-1 tablets (25-50 mg total) by mouth at bedtime as needed for sleep.       Follow-up Information     Jamse Arn, MD Follow up.   Specialty: Physical Medicine and Rehabilitation Why: Office will call you with follow up appointment Contact information: [address] [place] [address] [phone]         Drosinis, Pamalee Leyden, PA-C. Call on 01/27/2020.   Specialty: Internal Medicine Why: for post hospital follow up Contact information: [place] [address] [place] Follow up on 01/27/2020.   Why: for stroke follow up Contact information: [address]     [place] [address] [phone]        Dimas Aguas, MD. Call on 01/27/2020.   Specialty: Internal Medicine Why: for appointment in next 5-7 days Contact information: [address] [place] [address] [phone]            Signed: Bary Leriche 01/28/2020, 5:25 PM Patient was seen, face-face, and physical exam performed by me on day of discharge, greater than 30 minutes of total time spent.. Please see progress note from day of discharge as well.  Delice Lesch, MD, ABPMR

## 2020-01-25 DIAGNOSIS — R609 Edema, unspecified: Secondary | ICD-10-CM

## 2020-01-25 LAB — GLUCOSE, CAPILLARY
Glucose-Capillary: 101 mg/dL — ABNORMAL HIGH (ref 70–99)
Glucose-Capillary: 112 mg/dL — ABNORMAL HIGH (ref 70–99)

## 2020-01-25 MED ORDER — FUROSEMIDE 10 MG/ML IJ SOLN
40.0000 mg | Freq: Once | INTRAMUSCULAR | Status: AC
  Administered 2020-01-25: 13:00:00 40 mg via INTRAVENOUS
  Filled 2020-01-25: qty 4

## 2020-01-25 MED ORDER — FUROSEMIDE 40 MG PO TABS
40.0000 mg | ORAL_TABLET | ORAL | 0 refills | Status: DC
Start: 2020-01-27 — End: 2020-03-18

## 2020-01-25 NOTE — Plan of Care (Signed)
  Problem: Consults Goal: RH STROKE PATIENT EDUCATION Description: See Patient Education module for education specifics  Outcome: Completed/Met Goal: Diabetes Guidelines if Diabetic/Glucose > 140 Description: If diabetic or lab glucose is > 140 mg/dl - Initiate Diabetes/Hyperglycemia Guidelines & Document Interventions  Outcome: Completed/Met   Problem: RH BOWEL ELIMINATION Goal: RH STG MANAGE BOWEL WITH ASSISTANCE Description: STG Manage Bowel with min Assistance. Outcome: Completed/Met Goal: RH STG MANAGE BOWEL W/MEDICATION W/ASSISTANCE Description: STG Manage Bowel with Medication with  minAssistance. Outcome: Completed/Met   Problem: RH BLADDER ELIMINATION Goal: RH STG MANAGE BLADDER WITH ASSISTANCE Description: STG Manage Bladder With min Assistance Outcome: Completed/Met Goal: RH STG MANAGE BLADDER WITH MEDICATION WITH ASSISTANCE Description: STG Manage Bladder With Medication With min Assistance. Outcome: Completed/Met   Problem: RH SKIN INTEGRITY Goal: RH STG MAINTAIN SKIN INTEGRITY WITH ASSISTANCE Description: STG Maintain Skin Integrity With supervision Assistance. Outcome: Completed/Met Goal: RH STG ABLE TO PERFORM INCISION/WOUND CARE W/ASSISTANCE Description: STG Able To Perform Incision/Wound Care With supervision Assistance. Outcome: Completed/Met   Problem: RH SAFETY Goal: RH STG ADHERE TO SAFETY PRECAUTIONS W/ASSISTANCE/DEVICE Description: STG Adhere to Safety Precautions With min Assistance and appropriate assistive Device. Outcome: Completed/Met   Problem: RH PAIN MANAGEMENT Goal: RH STG PAIN MANAGED AT OR BELOW PT'S PAIN GOAL Description: <3 on a 0-10 pain scale Outcome: Completed/Met   Problem: RH KNOWLEDGE DEFICIT Goal: RH STG INCREASE KNOWLEDGE OF DIABETES Description: Patient will demonstrate knowledge on diabetic medications, dietary restrictions, and follow up care with the MD with min assist from [facility] staff. Outcome: Completed/Met Goal: RH STG  INCREASE KNOWLEDGE OF HYPERTENSION Description: Patient will demonstrate knowledge on HTN medications, dietary restrictions, and follow up care with the MD with min assist from [place] staff.  Outcome: Completed/Met Goal: RH STG INCREASE KNOWLEGDE OF HYPERLIPIDEMIA Description: Patient will demonstrate knowledge on HLD medications, dietary restrictions, and follow up care with the MD with min assist from [place] staff.  Outcome: Completed/Met Goal: RH STG INCREASE KNOWLEDGE OF STROKE PROPHYLAXIS Description: Patient will demonstrate knowledge on stroke prevention medications, dietary restrictions, and follow up care with the MD with min assist from [place] staff.  Outcome: Completed/Met

## 2020-01-25 NOTE — Progress Notes (Signed)
Patient discharged. Leaving floor via WC accompanied by NT and spouse. Patient belongings with patient and shower chair to be delivered to home Monday per charge nurse. IV removed and patient educated on home insulin use.

## 2020-01-25 NOTE — Progress Notes (Signed)
Spoke with worker at [facility]. Patient will have tub transfer bench delivered to house Monday. Patient's wife reports they have walker at home.

## 2020-01-25 NOTE — Progress Notes (Addendum)
[facility] PHYSICAL MEDICINE & REHABILITATION PROGRESS NOTE   Subjective/Complaints: Patient seen laying in bed this morning.  He states he slept well overnight.  He indicates he is ready to go home.  He was seen by nephrology yesterday, discussed with nephrology recommended diuresis, notes reviewed-recommend lab work.  Follow-up with nephrology again regarding discharge medications.  ROS: Denies CP, SOB, N/V/D  Objective:   No results found. Recent Labs    01/24/20 0449  WBC 2.8*  HGB 10.1*  HCT 31.2*  PLT 81*   Recent Labs    01/24/20 0449  NA 130*  K 3.7  CL 106  CO2 18*  GLUCOSE 130*  BUN 23  CREATININE 1.01  CALCIUM 7.9*    Intake/Output Summary (Last 24 hours) at 01/25/2020 0946 Last data filed at 01/24/2020 1857 Gross per 24 hour  Intake 480 ml  Output --  Net 480 ml     Physical Exam: Vital Signs Blood pressure 113/60, pulse 68, temperature 97.8 F (36.6 C), resp. rate 16, height 5\' 11"  (1.803 m), weight 87.7 kg, SpO2 98 %. Constitutional: No distress . Vital signs reviewed. HENT: Normocephalic.  Atraumatic. Eyes: EOMI. No discharge. Cardiovascular: No JVD. Respiratory: Normal effort.  No stridor. GI: Non-distended. Skin: Warm and dry.  Intact. Psych: Normal mood.  Normal behavior. Musc: LE edema improving Neuro: Alert Expressive >>receptive aphasia, gradually improving Motor: RUE/RLE: 5/5 proximal distal LUE/LLE: 4+/5 proximal to distal, unchanged  Assessment/Plan: 1. Functional deficits secondary to L ACA, MCA embolic infracts which require 3+ hours per day of interdisciplinary therapy in a comprehensive inpatient rehab setting.  Physiatrist is providing close team supervision and 24 hour management of active medical problems listed below.  Physiatrist and rehab team continue to assess barriers to discharge/monitor patient progress toward functional and medical goals  Care Tool:  Bathing    Body parts bathed by patient: Right arm, Left arm,  Chest, Abdomen, Front perineal area, Right upper leg, Left upper leg, Right lower leg, Left lower leg, Face, Buttocks   Body parts bathed by helper: Buttocks     Bathing assist Assist Level: Supervision/Verbal cueing     Upper Body Dressing/Undressing Upper body dressing   What is the patient wearing?: Pull over shirt    Upper body assist Assist Level: Set up assist    Lower Body Dressing/Undressing Lower body dressing      What is the patient wearing?: Incontinence brief, Pants     Lower body assist Assist for lower body dressing: Supervision/Verbal cueing     Toileting Toileting    Toileting assist Assist for toileting: Minimal Assistance - Patient > 75%     Transfers Chair/bed transfer  Transfers assist     Chair/bed transfer assist level: Supervision/Verbal cueing     Locomotion Ambulation   Ambulation assist      Assist level: Supervision/Verbal cueing Assistive device: Walker-rolling Max distance: 144ft   Walk 10 feet activity   Assist     Assist level: Supervision/Verbal cueing Assistive device: Walker-rolling   Walk 50 feet activity   Assist Walk 50 feet with 2 turns activity did not occur: Safety/medical concerns  Assist level: Supervision/Verbal cueing Assistive device: Walker-rolling    Walk 150 feet activity   Assist Walk 150 feet activity did not occur: Safety/medical concerns  Assist level: Supervision/Verbal cueing Assistive device: Walker-rolling    Walk 10 feet on uneven surface  activity   Assist Walk 10 feet on uneven surfaces activity did not occur: Safety/medical concerns  Assist level: Contact Guard/Touching assist Assistive device: Aeronautical engineer Will patient use wheelchair at discharge?: No Type of Wheelchair: Manual    Wheelchair assist level: Supervision/Verbal cueing Max wheelchair distance: 149ft    Wheelchair 50 feet with 2 turns activity    Assist         Assist Level: Supervision/Verbal cueing   Wheelchair 150 feet activity     Assist      Assist Level: Supervision/Verbal cueing   Blood pressure 113/60, pulse 68, temperature 97.8 F (36.6 C), resp. rate 16, height 5\' 11"  (1.803 m), weight 87.7 kg, SpO2 98 %.  Medical Problem List and Plan: 1.  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  DC today  Will see patient for transitional care management in 1-2 weeks post-discharge 2.  Antithrombotics: -DVT/anticoagulation:  Pharmaceutical: Other (comment)--Eliquis             -antiplatelet therapy: N/A 3. Pain Management: N/A  Wife reports chronic right hip pain, however no complaints per patient during rehab hospitalization with minimal with therapies.  Does not seem to affect functional progress letter. 4. Mood: LCSW to follow for evaluation and support when appropriate.              -antipsychotic agents: N/A 5. Neuropsych: This patient is not capable of making decisions on his own behalf. 6. Skin/Wound Care: Routine pressure relief measures.  7. Fluids/Electrolytes/Nutrition: Monitor I/Os.  8.  HTN: Monitor tid--Labetalol was d/c due to pauses--now on metoprolol, hydralazine and Nifedipine   Labile, but improving on 5/1             Monitor with increased mobility 9.  CKD s/p renal transplant: Continues to have upward trend BUN and creatinine despite IV fluids for hydration. On prednisone taper to 10 mg daily PTA. Sepra DS MWF,Prograf and Myfortic.              Creatinine 1.1 on 4/30  Appreciate nephro recs, work-up ordered, diuretics started-discussed with nephro, IV Lasix day of discharge, followed by 40 mg lasix PO MWF x1 week and then follow up with PCP  Continue to monitor 10. Steroid-induced hyperglycemia on T2DM with hyperglycemia: Hgb A1c- 7.1.  Was on metformin, Starlix and Januvia PTA. Monitor blood sugars ac/hs. Continue Lantus 20 units daily and use SSI for  elevated BS.    CBG (last 3)  Recent Labs    01/24/20 1629 01/24/20 2128 01/25/20 0642  GLUCAP 188* 226* 101*    Labile on 5/1             Monitor with increased mobility 11. Leukocytosis, now with leukopenia likely dilutional  WBC 2.8 on 4/30  Afebrile  UA slightly borderline chest x-ray unremarkable for infection 12.  Hyponatremia  Sodium 130 on 4/30  Recheck in am  13.  Thrombocytopenia  Platelets 81 on 4/30  Continue to monitor. 14.  Pseudomonas UTI  Completed course of cefepime on 4/28  > 30 minutes spent in total in discharge planning between myself and PA regarding aforementioned, as well discussion regarding DME equipment, follow-up appointments, follow-up therapies, discharge medications, discussion with nephro, diuretics discharge recommendations  LOS: 15 days A FACE TO FACE EVALUATION WAS PERFORMED  Setsuko Robins Lorie Phenix 01/25/2020, 9:46 AM

## 2020-01-25 NOTE — Progress Notes (Signed)
Patient and spouse educated to insulin pen use. Patient spouse performed insulin pen usage satisfactory and verbalizes understanding of insulin use. Several demonstrations performed by both nurse and patient spouse.

## 2020-01-25 NOTE — Progress Notes (Signed)
[facility] Progress Note  Name: Ryan Rice MRN: [id number] DOB: 04-Dec-1950  Chief Complaint:  At rehab post-stroke  Subjective:  Strict ins/outs are not available.  Got lasix 40 mg IV once yesterday.  states he feels less swollen.   Updated his wife at bedside.  Urine studies not obtained.  They are hoping to take him home today.  Review of systems:  Denies shortness of breath  Denies cp Denies n/v ------------  Background on consult:  Ryan Rice is a 69 year old male with a history of renal transplant (follows with Ryan Rice) who presented to rehab from [facility] after left ACA and MCA embolic infarcts.  Nephrology is consulted for assistance in management of fluid overload.  The patient is a poor historian and reports swelling over the past few days (4-5) however this was noted to have increased over the past day per staff report.     Intake/Output Summary (Last 24 hours) at 01/25/2020 1140 Last data filed at 01/25/2020 0800 Gross per 24 hour  Intake 600 ml  Output --  Net 600 ml    Vitals:  Vitals:   01/24/20 1306 01/24/20 2221 01/25/20 0418 01/25/20 0505  BP: 137/68 (!) 144/76 113/60   Pulse: 64 66 (!) 52 68  Resp: 18 16 16    Temp: 99.4 F (37.4 C) 98.2 F (36.8 C) 97.8 F (36.6 C)   TempSrc: Oral     SpO2: 100% 100% 98%   Weight:   87.7 kg   Height:         Physical Exam:  General adult male in bed in no acute distress HEENT normocephalic atraumatic extraocular movements intact sclera anicteric Neck supple trachea midline Lungs clear to auscultation bilaterally normal work of breathing at rest  Heart S1S2 no rub Abdomen soft nontender nondistended Extremities 2+ edema softer Psych normal mood and affect Neuro - awake and interactive. First introduces his wife as his son.    Medications reviewed   Labs:  BMP Latest Ref Rng & Units 01/24/2020 01/20/2020 01/17/2020  Glucose 70 - 99 mg/dL 130(H) 160(H) 222(H)  BUN 8 - 23 mg/dL  23 28(H) 41(H)  Creatinine 0.61 - 1.24 mg/dL 1.01 1.17 1.44(H)  Sodium 135 - 145 mmol/L 130(L) 132(L) 132(L)  Potassium 3.5 - 5.1 mmol/L 3.7 4.0 4.2  Chloride 98 - 111 mmol/L 106 108 107  CO2 22 - 32 mmol/L 18(L) 17(L) 17(L)  Calcium 8.9 - 10.3 mg/dL 7.9(L) 8.1(L) 8.2(L)     Assessment/Plan:   # Fluid volume overload - note hx steroids. ? Contributing  - Lasix 40 mg IV once again today then lasix 40 mg MWF for one week until seen in follow-up with his PCP - Would obtain renal panel/electrolytes at PCP follow-up in one week   - Asked them to set up routine follow-up with nephrology - Dr. Dimas Aguas  - If worsening would recommend sooner appt with nephrology and urine protein/cr ratio - Encouraged elevating his legs and compression stockings - his wife says that he has these  # Renal transplant  - baseline appears to be low 1's per [facility] and [facility] care everywhere records (1.23 on 12/20/19 per [facility] records in [place]) - follows with Ryan Rice  # Immunosuppression  continue current regimen.  Note that his Bactrim is for PCP prophylaxis with his transplant, not for UTI treatment.  This should be continued on discharge  # Recent acute stroke at rehab for the same  # UTI  status/post cefepime  Claudia Desanctis, MD 01/25/2020 11:59 AM

## 2020-01-25 NOTE — Plan of Care (Signed)
  Problem: RH SAFETY Goal: RH STG ADHERE TO SAFETY PRECAUTIONS W/ASSISTANCE/DEVICE Description: STG Adhere to Safety Precautions With min Assistance and appropriate assistive Device. Outcome: Completed/Met   Problem: RH PAIN MANAGEMENT Goal: RH STG PAIN MANAGED AT OR BELOW PT'S PAIN GOAL Description: <3 on a 0-10 pain scale Outcome: Completed/Met   Problem: RH KNOWLEDGE DEFICIT Goal: RH STG INCREASE KNOWLEDGE OF DIABETES Description: Patient will demonstrate knowledge on diabetic medications, dietary restrictions, and follow up care with the MD with min assist from [place] staff. Outcome: Completed/Met Goal: RH STG INCREASE KNOWLEDGE OF HYPERTENSION Description: Patient will demonstrate knowledge on HTN medications, dietary restrictions, and follow up care with the MD with min assist from [place] staff.  Outcome: Completed/Met Goal: RH STG INCREASE KNOWLEGDE OF HYPERLIPIDEMIA Description: Patient will demonstrate knowledge on HLD medications, dietary restrictions, and follow up care with the MD with min assist from [place] staff.  Outcome: Completed/Met Goal: RH STG INCREASE KNOWLEDGE OF STROKE PROPHYLAXIS Description: Patient will demonstrate knowledge on stroke prevention medications, dietary restrictions, and follow up care with the MD with min assist from [place] staff.  Outcome: Completed/Met

## 2020-01-28 ENCOUNTER — Telehealth: Payer: Self-pay | Admitting: *Deleted

## 2020-01-28 NOTE — Telephone Encounter (Signed)
Ryan Rice, PT, [facility] left a ,essage asking if [name] would be signing [place] orders.  I contacted therapist and informed.  He then asked for verbal orders for HHPT 1wk9 and HHRN for medication management.  Medical record reviewed. Social work note reviewed.  Verbal orders given per office protocol.

## 2020-02-01 ENCOUNTER — Other Ambulatory Visit: Payer: Self-pay | Admitting: Physical Medicine and Rehabilitation

## 2020-02-13 ENCOUNTER — Encounter: Payer: Self-pay | Admitting: Physical Medicine & Rehabilitation

## 2020-02-13 ENCOUNTER — Encounter: Payer: Medicare HMO | Attending: Physical Medicine & Rehabilitation | Admitting: Physical Medicine & Rehabilitation

## 2020-02-13 ENCOUNTER — Other Ambulatory Visit: Payer: Self-pay

## 2020-02-13 VITALS — BP 129/77 | HR 94 | Temp 98.7°F | Ht 71.0 in | Wt 167.0 lb

## 2020-02-13 DIAGNOSIS — L03116 Cellulitis of left lower limb: Secondary | ICD-10-CM | POA: Diagnosis present

## 2020-02-13 DIAGNOSIS — I10 Essential (primary) hypertension: Secondary | ICD-10-CM | POA: Insufficient documentation

## 2020-02-13 DIAGNOSIS — I63132 Cerebral infarction due to embolism of left carotid artery: Secondary | ICD-10-CM | POA: Insufficient documentation

## 2020-02-13 DIAGNOSIS — Z940 Kidney transplant status: Secondary | ICD-10-CM | POA: Diagnosis present

## 2020-02-13 DIAGNOSIS — R269 Unspecified abnormalities of gait and mobility: Secondary | ICD-10-CM | POA: Diagnosis not present

## 2020-02-13 MED ORDER — DOXYCYCLINE HYCLATE 100 MG PO TBEC: 100.0000 mg | DELAYED_RELEASE_TABLET | Freq: Two times a day (BID) | ORAL | 0 refills | Status: DC

## 2020-02-13 NOTE — Progress Notes (Signed)
Subjective:    Patient ID: Ryan Rice, male    DOB: 18-Dec-1950, 69 y.o.   MRN: [id number]  HPI Male with history of PVD, CAF, BPH, HTN, renal transplant presents for hospital follow-up after receiving CIR for left MCA/ACA embolic infarcts.  Wife provides history. At discharge, he was instructed to check CBGs, which have been trending up. Educated on diet. He saw his PCP. He has not been able to schedule with Neurology. She saw Nephro as well with plans to draw labs. Denies shoulder and hip pain. BP controlled. He sees Endo tomorrow. He had lab work a week ago.  Therapies: 1/week DME: Shower chair, previously owned bedside commode Mobility: Walker in house with assistance  Pain Inventory Average Pain 3 Pain Right Now 1 My pain is intermittent  In the last 24 hours, has pain interfered with the following? General activity 2 Relation with others 0 Enjoyment of life 1 What TIME of day is your pain at its worst? daytime Sleep (in general) Fair  Pain is worse with: walking and standing Pain improves with: na Relief from Meds: na  Mobility walk with assistance ability to climb steps?  yes do you drive?  no use a wheelchair needs help with transfers  Function retired I need assistance with the following:  dressing, bathing, toileting, meal prep, household duties and shopping  Neuro/Psych bladder control problems bowel control problems weakness trouble walking  Prior Studies HFU  Physicians involved in your care HFU   Family History  Problem Relation Age of Onset  . Stroke Mother   . Heart disease Father   . Diabetes Father   . Diabetes Sister   . Diabetes Brother   . Heart disease Brother    Social History   Socioeconomic History  . Marital status: Married    Spouse name: Not on file  . Number of children: Not on file  . Years of education: Not on file  . Highest education level: Not on file  Occupational History  . Not on file  Tobacco Use  .  Smoking status: Former Smoker    Types: Cigarettes    Quit date: 03/07/1984    Years since quitting: 35.9  . Smokeless tobacco: Never Used  . Tobacco comment: N/A  Substance and Sexual Activity  . Alcohol use: Yes    Comment: rare use  . Drug use: Never  . Sexual activity: Yes    Birth control/protection: Post-menopausal  Other Topics Concern  . Not on file  Social History Narrative  . Not on file   Social Determinants of Health   Financial Resource Strain:   . Difficulty of Paying Living Expenses:   Food Insecurity:   . Worried About Charity fundraiser in the Last Year:   . Arboriculturist in the Last Year:   Transportation Needs:   . Film/video editor (Medical):   Marland Kitchen Lack of Transportation (Non-Medical):   Physical Activity:   . Days of Exercise per Week:   . Minutes of Exercise per Session:   Stress:   . Feeling of Stress :   Social Connections:   . Frequency of Communication with Friends and Family:   . Frequency of Social Gatherings with Friends and Family:   . Attends Religious Services:   . Active Member of Clubs or Organizations:   . Attends Archivist Meetings:   Marland Kitchen Marital Status:    Past Surgical History:  Procedure Laterality Date  . KIDNEY TRANSPLANT  Past Medical History:  Diagnosis Date  . Allergic rhinitis   . BPH (benign prostatic hyperplasia)   . Chronic atrial fibrillation ([place])   . Diabetes mellitus ([place])   . DVT (deep venous thrombosis) ([facility])    LLE  . Hematochezia   . HTN (hypertension)   . Hypomagnesemia 03/20/2019  . Kidney transplant recipient   . PVD (peripheral vascular disease) ([facility])    BP 129/77   Pulse 94   Temp 98.7 F (37.1 C)   Ht 5\' 11"  (1.803 m)   Wt 167 lb (75.8 kg)   SpO2 96%   BMI 23.29 kg/m   Opioid Risk Score:   Fall Risk Score:  `1  Depression screen PHQ 2/9  Depression screen [facility] 2/9 02/13/2020 03/28/2019  Decreased Interest 1 0  Down, Depressed, Hopeless 0 0  PHQ - 2 Score 1 0  Altered  sleeping 1 0  Tired, decreased energy 3 -  Change in appetite 1 0  Feeling bad or failure about yourself  0 0  Trouble concentrating 1 0  Moving slowly or fidgety/restless 1 0  Suicidal thoughts 0 0  PHQ-9 Score 8 0  Difficult doing work/chores Very difficult -    Review of Systems  Gastrointestinal: Positive for diarrhea.  Musculoskeletal: Positive for gait problem.  All other systems reviewed and are negative.      Objective:   Physical Exam  Constitutional: No distress . Vital signs reviewed. HENT: Normocephalic.  Atraumatic. Eyes: EOMI. No discharge. Cardiovascular: No JVD. Respiratory: Normal effort.  No stridor. GI: Non-distended. Skin: Scab on dorsal left foot with surrounding discoloration and tenderness Psych: Normal mood.  Normal behavior. Musc: Left dorsal foot tenderness Neuro: Alert Expressive >>receptive aphasia, gradually improving Motor: RUE/RLE: 5/5 proximal distal LUE/LLE: 4+/5 proximal to distal, stable    Assessment & Plan:  Male with history of PVD, CAF, BPH, HTN, renal transplant presents for hospital follow-up after receiving CIR for left MCA/ACA embolic infarcts.  1. 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  Cont therapies  Follow up with Neurology  2. Pain Management:   Denies shoudler and hip pain at present  3. HTN:   Cont meds  Controlled today  4. CKD s/p renal transplant:   Follow up with Nephro  Cont meds  5. 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 Hematology  Labs recently ordered by Endo (unaviable for view) with plan for further workup with Nephro  7. Gait abnormality  Cont wheelchair for safety  Cont therapies  8. Cellulitis  Doxycycline 100 BID x 10 days ordered

## 2020-02-16 ENCOUNTER — Other Ambulatory Visit: Payer: Self-pay | Admitting: Physical Medicine and Rehabilitation

## 2020-02-17 ENCOUNTER — Telehealth: Payer: Self-pay | Admitting: *Deleted

## 2020-02-17 ENCOUNTER — Other Ambulatory Visit: Payer: Self-pay | Admitting: Physical Medicine and Rehabilitation

## 2020-02-17 DIAGNOSIS — I129 Hypertensive chronic kidney disease with stage 1 through stage 4 chronic kidney disease, or unspecified chronic kidney disease: Secondary | ICD-10-CM

## 2020-02-17 DIAGNOSIS — Z7901 Long term (current) use of anticoagulants: Secondary | ICD-10-CM

## 2020-02-17 DIAGNOSIS — E1122 Type 2 diabetes mellitus with diabetic chronic kidney disease: Secondary | ICD-10-CM

## 2020-02-17 DIAGNOSIS — Z940 Kidney transplant status: Secondary | ICD-10-CM

## 2020-02-17 DIAGNOSIS — Z9181 History of falling: Secondary | ICD-10-CM

## 2020-02-17 DIAGNOSIS — N189 Chronic kidney disease, unspecified: Secondary | ICD-10-CM | POA: Diagnosis not present

## 2020-02-17 DIAGNOSIS — H409 Unspecified glaucoma: Secondary | ICD-10-CM

## 2020-02-17 DIAGNOSIS — K219 Gastro-esophageal reflux disease without esophagitis: Secondary | ICD-10-CM

## 2020-02-17 DIAGNOSIS — I69318 Other symptoms and signs involving cognitive functions following cerebral infarction: Secondary | ICD-10-CM | POA: Diagnosis not present

## 2020-02-17 DIAGNOSIS — M1990 Unspecified osteoarthritis, unspecified site: Secondary | ICD-10-CM

## 2020-02-17 NOTE — Telephone Encounter (Signed)
Kelly from [facility] called and is asking to reduce SN to 1wk3 then 1 qowk 4.  She reports that there is so much going on with his other disciplines and his appointments,  it is hard to schedule a time to visit.

## 2020-02-17 NOTE — Telephone Encounter (Signed)
That's fine. Thanks!

## 2020-02-17 NOTE — Telephone Encounter (Signed)
Called and notified Claiborne Billings, RN with [facility]

## 2020-02-25 ENCOUNTER — Other Ambulatory Visit: Payer: Self-pay | Admitting: Physical Medicine and Rehabilitation

## 2020-03-02 ENCOUNTER — Telehealth: Payer: Self-pay

## 2020-03-02 NOTE — Telephone Encounter (Signed)
Kenney Houseman, RN from [facility] called stating patient wife called to come and assess patient because he has a wound on his right buttocks. She went to access and it is a blister that opened. She needs orders to dress and teach wife how to dress it a few times a week. She also stated that he is not wanting to get out of bed, eat or drink.

## 2020-03-02 NOTE — Telephone Encounter (Signed)
Is the cellulitis on his left foot? It was closed when I saw him. I will need to see him again or he needs to be seen by someone if this is getting worse.  We can place ordered for social worker.  Thanks.

## 2020-03-02 NOTE — Telephone Encounter (Signed)
Received a message from [place], Publishing copy, [facility] as well. This came in on Friday the 4th of June.  She says they were sending an RN to assess the wound/pressure ulcer.  Which apparently patient has been seen and nurse is needing orders to address the wound.  Also, she is asking for orders for home health social work due to caregiver overload. She states caregiver is overwhelmed with taking care of patient. She says social work will help with community resources and long term planning

## 2020-03-03 NOTE — Telephone Encounter (Signed)
Left Kenney Houseman, RN a detailed message.

## 2020-03-04 ENCOUNTER — Inpatient Hospital Stay (HOSPITAL_BASED_OUTPATIENT_CLINIC_OR_DEPARTMENT_OTHER)
Admission: EM | Admit: 2020-03-04 | Discharge: 2020-03-18 | DRG: 699 | Disposition: A | Discharge status: Home Health | Payer: Medicare HMO | Attending: Internal Medicine | Admitting: Internal Medicine

## 2020-03-04 ENCOUNTER — Emergency Department (HOSPITAL_BASED_OUTPATIENT_CLINIC_OR_DEPARTMENT_OTHER): Payer: Medicare HMO

## 2020-03-04 ENCOUNTER — Encounter (HOSPITAL_BASED_OUTPATIENT_CLINIC_OR_DEPARTMENT_OTHER): Payer: Self-pay | Admitting: Emergency Medicine

## 2020-03-04 ENCOUNTER — Other Ambulatory Visit: Payer: Self-pay

## 2020-03-04 DIAGNOSIS — E1151 Type 2 diabetes mellitus with diabetic peripheral angiopathy without gangrene: Secondary | ICD-10-CM | POA: Diagnosis present

## 2020-03-04 DIAGNOSIS — Z8673 Personal history of transient ischemic attack (TIA), and cerebral infarction without residual deficits: Secondary | ICD-10-CM

## 2020-03-04 DIAGNOSIS — I69998 Other sequelae following unspecified cerebrovascular disease: Secondary | ICD-10-CM | POA: Diagnosis not present

## 2020-03-04 DIAGNOSIS — I69320 Aphasia following cerebral infarction: Secondary | ICD-10-CM

## 2020-03-04 DIAGNOSIS — I482 Chronic atrial fibrillation, unspecified: Secondary | ICD-10-CM | POA: Diagnosis present

## 2020-03-04 DIAGNOSIS — E785 Hyperlipidemia, unspecified: Secondary | ICD-10-CM | POA: Diagnosis present

## 2020-03-04 DIAGNOSIS — E875 Hyperkalemia: Secondary | ICD-10-CM | POA: Diagnosis present

## 2020-03-04 DIAGNOSIS — Z7901 Long term (current) use of anticoagulants: Secondary | ICD-10-CM | POA: Diagnosis not present

## 2020-03-04 DIAGNOSIS — T451X5A Adverse effect of antineoplastic and immunosuppressive drugs, initial encounter: Secondary | ICD-10-CM | POA: Diagnosis present

## 2020-03-04 DIAGNOSIS — E1159 Type 2 diabetes mellitus with other circulatory complications: Secondary | ICD-10-CM | POA: Diagnosis not present

## 2020-03-04 DIAGNOSIS — E861 Hypovolemia: Secondary | ICD-10-CM | POA: Diagnosis present

## 2020-03-04 DIAGNOSIS — Z940 Kidney transplant status: Secondary | ICD-10-CM | POA: Diagnosis not present

## 2020-03-04 DIAGNOSIS — Z79899 Other long term (current) drug therapy: Secondary | ICD-10-CM

## 2020-03-04 DIAGNOSIS — E1169 Type 2 diabetes mellitus with other specified complication: Secondary | ICD-10-CM | POA: Diagnosis not present

## 2020-03-04 DIAGNOSIS — N179 Acute kidney failure, unspecified: Secondary | ICD-10-CM | POA: Diagnosis not present

## 2020-03-04 DIAGNOSIS — Y830 Surgical operation with transplant of whole organ as the cause of abnormal reaction of the patient, or of later complication, without mention of misadventure at the time of the procedure: Secondary | ICD-10-CM | POA: Diagnosis present

## 2020-03-04 DIAGNOSIS — R2689 Other abnormalities of gait and mobility: Secondary | ICD-10-CM | POA: Diagnosis present

## 2020-03-04 DIAGNOSIS — F329 Major depressive disorder, single episode, unspecified: Secondary | ICD-10-CM | POA: Diagnosis present

## 2020-03-04 DIAGNOSIS — Z833 Family history of diabetes mellitus: Secondary | ICD-10-CM

## 2020-03-04 DIAGNOSIS — I152 Hypertension secondary to endocrine disorders: Secondary | ICD-10-CM | POA: Diagnosis present

## 2020-03-04 DIAGNOSIS — R1013 Epigastric pain: Secondary | ICD-10-CM

## 2020-03-04 DIAGNOSIS — Z20822 Contact with and (suspected) exposure to covid-19: Secondary | ICD-10-CM | POA: Diagnosis present

## 2020-03-04 DIAGNOSIS — B3781 Candidal esophagitis: Secondary | ICD-10-CM | POA: Diagnosis present

## 2020-03-04 DIAGNOSIS — E1165 Type 2 diabetes mellitus with hyperglycemia: Secondary | ICD-10-CM | POA: Diagnosis present

## 2020-03-04 DIAGNOSIS — E119 Type 2 diabetes mellitus without complications: Secondary | ICD-10-CM

## 2020-03-04 DIAGNOSIS — T8619 Other complication of kidney transplant: Secondary | ICD-10-CM | POA: Diagnosis present

## 2020-03-04 DIAGNOSIS — I4819 Other persistent atrial fibrillation: Secondary | ICD-10-CM | POA: Diagnosis present

## 2020-03-04 DIAGNOSIS — R1310 Dysphagia, unspecified: Secondary | ICD-10-CM | POA: Diagnosis present

## 2020-03-04 DIAGNOSIS — D649 Anemia, unspecified: Secondary | ICD-10-CM | POA: Diagnosis present

## 2020-03-04 DIAGNOSIS — L89150 Pressure ulcer of sacral region, unstageable: Secondary | ICD-10-CM | POA: Diagnosis present

## 2020-03-04 DIAGNOSIS — E872 Acidosis, unspecified: Secondary | ICD-10-CM

## 2020-03-04 DIAGNOSIS — Z794 Long term (current) use of insulin: Secondary | ICD-10-CM

## 2020-03-04 DIAGNOSIS — I10 Essential (primary) hypertension: Secondary | ICD-10-CM | POA: Diagnosis present

## 2020-03-04 DIAGNOSIS — N400 Enlarged prostate without lower urinary tract symptoms: Secondary | ICD-10-CM | POA: Diagnosis present

## 2020-03-04 DIAGNOSIS — R319 Hematuria, unspecified: Secondary | ICD-10-CM | POA: Diagnosis present

## 2020-03-04 DIAGNOSIS — E1121 Type 2 diabetes mellitus with diabetic nephropathy: Secondary | ICD-10-CM | POA: Diagnosis not present

## 2020-03-04 DIAGNOSIS — B370 Candidal stomatitis: Secondary | ICD-10-CM | POA: Diagnosis present

## 2020-03-04 DIAGNOSIS — L8990 Pressure ulcer of unspecified site, unspecified stage: Secondary | ICD-10-CM | POA: Insufficient documentation

## 2020-03-04 DIAGNOSIS — Z87891 Personal history of nicotine dependence: Secondary | ICD-10-CM

## 2020-03-04 DIAGNOSIS — R197 Diarrhea, unspecified: Secondary | ICD-10-CM | POA: Diagnosis present

## 2020-03-04 DIAGNOSIS — Z823 Family history of stroke: Secondary | ICD-10-CM

## 2020-03-04 DIAGNOSIS — Z7952 Long term (current) use of systemic steroids: Secondary | ICD-10-CM

## 2020-03-04 DIAGNOSIS — R8281 Pyuria: Secondary | ICD-10-CM | POA: Diagnosis present

## 2020-03-04 DIAGNOSIS — R739 Hyperglycemia, unspecified: Secondary | ICD-10-CM

## 2020-03-04 DIAGNOSIS — Z8249 Family history of ischemic heart disease and other diseases of the circulatory system: Secondary | ICD-10-CM

## 2020-03-04 LAB — CBC WITH DIFFERENTIAL/PLATELET
Abs Immature Granulocytes: 0.15 10*3/uL — ABNORMAL HIGH (ref 0.00–0.07)
Basophils Absolute: 0 10*3/uL (ref 0.0–0.1)
Basophils Relative: 0 %
Eosinophils Absolute: 0 10*3/uL (ref 0.0–0.5)
Eosinophils Relative: 0 %
HCT: 36 % — ABNORMAL LOW (ref 39.0–52.0)
Hemoglobin: 11.5 g/dL — ABNORMAL LOW (ref 13.0–17.0)
Immature Granulocytes: 1 %
Lymphocytes Relative: 3 %
Lymphs Abs: 0.5 10*3/uL — ABNORMAL LOW (ref 0.7–4.0)
MCH: 25.8 pg — ABNORMAL LOW (ref 26.0–34.0)
MCHC: 31.9 g/dL (ref 30.0–36.0)
MCV: 80.9 fL (ref 80.0–100.0)
Monocytes Absolute: 0.4 10*3/uL (ref 0.1–1.0)
Monocytes Relative: 3 %
Neutro Abs: 15.3 10*3/uL — ABNORMAL HIGH (ref 1.7–7.7)
Neutrophils Relative %: 93 %
Platelets: 174 10*3/uL (ref 150–400)
RBC: 4.45 MIL/uL (ref 4.22–5.81)
RDW: 16.4 % — ABNORMAL HIGH (ref 11.5–15.5)
WBC: 16.4 10*3/uL — ABNORMAL HIGH (ref 4.0–10.5)
nRBC: 0 % (ref 0.0–0.2)

## 2020-03-04 LAB — I-STAT VENOUS BLOOD GAS, ED
Acid-base deficit: 11 mmol/L — ABNORMAL HIGH (ref 0.0–2.0)
Bicarbonate: 13.2 mmol/L — ABNORMAL LOW (ref 20.0–28.0)
Calcium, Ion: 1.33 mmol/L (ref 1.15–1.40)
HCT: 34 % — ABNORMAL LOW (ref 39.0–52.0)
Hemoglobin: 11.6 g/dL — ABNORMAL LOW (ref 13.0–17.0)
O2 Saturation: 19 %
Potassium: 4.5 mmol/L (ref 3.5–5.1)
Sodium: 130 mmol/L — ABNORMAL LOW (ref 135–145)
TCO2: 14 mmol/L — ABNORMAL LOW (ref 22–32)
pCO2, Ven: 25.8 mmHg — ABNORMAL LOW (ref 44.0–60.0)
pH, Ven: 7.318 (ref 7.250–7.430)
pO2, Ven: 16 mmHg — CL (ref 32.0–45.0)

## 2020-03-04 LAB — CBG MONITORING, ED
Glucose-Capillary: 440 mg/dL — ABNORMAL HIGH (ref 70–99)
Glucose-Capillary: 461 mg/dL — ABNORMAL HIGH (ref 70–99)
Glucose-Capillary: 405 mg/dL — ABNORMAL HIGH (ref 70–99)

## 2020-03-04 LAB — BRAIN NATRIURETIC PEPTIDE: B Natriuretic Peptide: 1037.6 pg/mL — ABNORMAL HIGH (ref 0.0–100.0)

## 2020-03-04 LAB — COMPREHENSIVE METABOLIC PANEL
ALT: 13 U/L (ref 0–44)
AST: 12 U/L — ABNORMAL LOW (ref 15–41)
Albumin: 2.4 g/dL — ABNORMAL LOW (ref 3.5–5.0)
Alkaline Phosphatase: 61 U/L (ref 38–126)
Anion gap: 11 (ref 5–15)
BUN: 73 mg/dL — ABNORMAL HIGH (ref 8–23)
CO2: 13 mmol/L — ABNORMAL LOW (ref 22–32)
Calcium: 8.5 mg/dL — ABNORMAL LOW (ref 8.9–10.3)
Chloride: 102 mmol/L (ref 98–111)
Creatinine, Ser: 2.92 mg/dL — ABNORMAL HIGH (ref 0.61–1.24)
GFR calc Af Amer: 24 mL/min — ABNORMAL LOW (ref >60)
GFR calc non Af Amer: 21 mL/min — ABNORMAL LOW (ref >60)
Glucose, Bld: 528 mg/dL (ref 70–99)
Potassium: 4.3 mmol/L (ref 3.5–5.1)
Sodium: 126 mmol/L — ABNORMAL LOW (ref 135–145)
Total Bilirubin: 1.1 mg/dL (ref 0.3–1.2)
Total Protein: 6.6 g/dL (ref 6.5–8.1)

## 2020-03-04 LAB — TROPONIN I (HIGH SENSITIVITY)
Troponin I (High Sensitivity): 28 ng/L — ABNORMAL HIGH (ref <18)
Troponin I (High Sensitivity): 25 ng/L — ABNORMAL HIGH (ref <18)

## 2020-03-04 LAB — LACTIC ACID, PLASMA
Lactic Acid, Venous: 1.9 mmol/L (ref 0.5–1.9)
Lactic Acid, Venous: 1.4 mmol/L (ref 0.5–1.9)

## 2020-03-04 LAB — LIPASE, BLOOD: Lipase: 44 U/L (ref 11–51)

## 2020-03-04 LAB — SARS CORONAVIRUS 2 BY RT PCR (HOSPITAL ORDER, PERFORMED IN ~~LOC~~ HOSPITAL LAB): SARS Coronavirus 2: NEGATIVE

## 2020-03-04 MED ORDER — INSULIN ASPART 100 UNIT/ML ~~LOC~~ SOLN
7.0000 [IU] | Freq: Once | SUBCUTANEOUS | Status: AC
  Administered 2020-03-04: 7 [IU] via INTRAVENOUS
  Filled 2020-03-04: qty 7

## 2020-03-04 MED ORDER — SODIUM CHLORIDE 0.9 % IV BOLUS
500.0000 mL | Freq: Once | INTRAVENOUS | Status: AC
  Administered 2020-03-04: 500 mL via INTRAVENOUS

## 2020-03-04 MED ORDER — INSULIN GLARGINE 100 UNIT/ML ~~LOC~~ SOLN
7.0000 [IU] | Freq: Once | SUBCUTANEOUS | Status: AC
  Administered 2020-03-04: 7 [IU] via SUBCUTANEOUS
  Filled 2020-03-04: qty 1

## 2020-03-04 NOTE — ED Notes (Signed)
Called W/F PAL line for Dr Billy Fischer to have consul with Renal Tranplant @ [phone] spoke to [place]

## 2020-03-04 NOTE — H&P (Addendum)
History and Physical    Ryan Rice LOV:[id number] DOB: Jul 08, 1951 DOA: 03/04/2020  PCP: Mina Marble, PA-C  Patient coming from: [place] have personally briefly reviewed patient's old medical records in [place]  Chief Complaint: Decreased urine output, loose stools, vomiting  HPI: Ryan Rice is a 69 y.o. male with medical history significant for ESRD s/p renal transplant on immunosuppressants, history of CVA with residual expressive aphasia, atrial fibrillation on Eliquis, type 2 diabetes, hypertension, hyperlipidemia, PVD, and BPH who presented to the ED for evaluation of low blood pressure, decreased oral intake, and epigastric pain.  History is somewhat limited from patient due to the expressive aphasia and is otherwise supplemented from EDP and chart review.  Patient recently hospitalized from 12/29/2019-01/10/2020 for aphasia and confusion.  Initial head imaging concerning for brain tumor however serial imaging and evaluation by neurosurgery and neurology suggested subacute embolic evolving CVA in the setting of atrial fibrillation.  Patient was started on Eliquis and discharged to inpatient rehab where he stayed until 01/25/2020 before discharge.  He has had residual expressive aphasia and mild gait imbalance from his CVA.  Per ED documentation patient has had poor oral intake for about 1 week.  He has had epigastric abdominal pain without emesis.  He has had reported gagging with some medications.  He has reported chronically loose stools which have continued since his current symptoms started.  Patient states he has been having decreased urine output than expected with last urination at least a couple days ago.  He does report loose stools and decreased oral intake.  [facility] ED Course:  Initial vitals showed BP 103/67, pulse 101, RR 16, temp 98.4 Fahrenheit, SPO2 99% on room air.  Labs are notable for BUN 73, creatinine 2.92, serum glucose  528, sodium 126 (136 when corrected for hyperglycemia), potassium 4.3, bicarb 13, anion gap 11, AST 12, ALT 13, alk phos 61, total bilirubin 1.1, WBC 16.4, hemoglobin 11.5, platelets 174,000, lactic acid 1.9 >> 1.4, high-sensitivity troponin I 28 >> 25, BNP 1037.6.  VBG showed pH 7.318, PCO2 25.8, PO2 16.  Urinalysis and urine culture were ordered and pending.  SARS-CoV-2 PCR is negative.  Portable chest x-ray showed stable enlarged cardiac silhouette without acute infiltrate, edema, pleural effusion, or pneumothorax.  CT abdomen/pelvis without contrast showed cholelithiasis without evidence of acute cholecystitis.  Marked severity diffuse bilateral renal cortical thinning of the native kidneys with a stable 1.1 cm x 1.3 cm round hyperdense area within the posterior lateral aspect of the mid right kidney is seen which may represent hemorrhagic cyst.  Transplanted kidney seen within the pelvis on the right.  Colonic diverticulosis without evidence of diverticulitis noted.  Patient was given 500 cc normal saline, IV NovoLog 7 units, subcutaneous Lantus 7 units.  The hospitalist service was consulted to transfer/admit for further evaluation and management.  Of note the ED physician did discuss with renal transplant team at [facility] who felt patient's acute renal failure is likely from prerenal cause and that patient could be admitted in the [facility] health system due to no beds at [facility].  Review of Systems: All systems reviewed and are negative except as documented in history of present illness above.   Past Medical History:  Diagnosis Date  . Allergic rhinitis   . BPH (benign prostatic hyperplasia)   . Chronic atrial fibrillation ([place])   . Diabetes mellitus ([place])   . DVT (deep venous thrombosis) ([place])  LLE  . Hematochezia   . HTN (hypertension)   . Hypomagnesemia 03/20/2019  . Kidney transplant recipient   . PVD (peripheral vascular disease) ([place])     Past Surgical History:    Procedure Laterality Date  . KIDNEY TRANSPLANT      Social History:  reports that he quit smoking about 36 years ago. His smoking use included cigarettes. He has never used smokeless tobacco. He reports current alcohol use. He reports that he does not use drugs.  Allergies  Allergen Reactions  . Iodinated Diagnostic Agents Hives  . Tape Hives    Family History  Problem Relation Age of Onset  . Stroke Mother   . Heart disease Father   . Diabetes Father   . Diabetes Sister   . Diabetes Brother   . Heart disease Brother      Prior to Admission medications   Medication Sig Start Date End Date Taking? Authorizing Provider  apixaban (ELIQUIS) 5 MG TABS tablet Take 1 tablet (5 mg total) by mouth 2 (two) times daily. 01/24/20  Yes Love, Ivan Anchors, PA-C  atorvastatin (LIPITOR) 80 MG tablet Take 1 tablet (80 mg total) by mouth daily at 6 PM. 01/24/20  Yes Love, Ivan Anchors, PA-C  brimonidine (ALPHAGAN) 0.2 % ophthalmic solution Place 1 drop into the right eye 2 (two) times daily. 12/05/19  Yes [provider]  cinacalcet (SENSIPAR) 90 MG tablet Take 90 mg by mouth daily.   Yes [provider]  diclofenac Sodium (VOLTAREN) 1 % GEL Apply 4 g topically 2 (two) times daily. Apply to hips and legs 12/21/19  Yes [provider]  dorzolamide-timolol (COSOPT) 22.3-6.8 MG/ML ophthalmic solution Place 1 drop into the right eye 2 (two) times a day. 01/19/16  Yes [provider]  famotidine (PEPCID) 20 MG tablet Take 1 tablet (20 mg total) by mouth daily. Patient taking differently: Take 20 mg by mouth 2 (two) times daily.  01/24/20  Yes Love, Ivan Anchors, PA-C  finasteride (PROSCAR) 5 MG tablet Take 1 tablet (5 mg total) by mouth daily. 01/24/20  Yes Love, Ivan Anchors, PA-C  folic acid (FOLVITE) 1 MG tablet TAKE 1 TABLET BY MOUTH EVERY DAY Patient taking differently: Take 1 mg by mouth daily.  05/27/19  Yes Kirsteins, Luanna Salk, MD  hydrALAZINE (APRESOLINE) 25 MG tablet Take 1  tablet (25 mg total) by mouth every 6 (six) hours. 01/24/20  Yes Love, Ivan Anchors, PA-C  JANUVIA 100 MG tablet Take 100 mg by mouth daily. 02/17/20  Yes [provider]  magnesium oxide (MAG-OX) 400 MG tablet Take 1 tablet (400 mg total) by mouth daily. 01/24/20  Yes Love, Ivan Anchors, PA-C  metFORMIN (GLUCOPHAGE-XR) 500 MG 24 hr tablet Take 500 mg by mouth in the morning and at bedtime.   Yes [provider]  metoprolol tartrate (LOPRESSOR) 25 MG tablet Take 0.5 tablets (12.5 mg total) by mouth 2 (two) times daily. 01/24/20  Yes Love, Ivan Anchors, PA-C  mycophenolate (MYFORTIC) 180 MG EC tablet Take 360 mg by mouth 2 (two) times daily.    Yes [provider]  nateglinide (STARLIX) 120 MG tablet Take 120 mg by mouth 2 (two) times daily. <130 -only take 1 tablet 02/17/20  Yes [provider]  NIFEdipine (ADALAT CC) 60 MG 24 hr tablet Take 1 tablet (60 mg total) by mouth daily. 01/24/20  Yes Love, Ivan Anchors, PA-C  pioglitazone (ACTOS) 30 MG tablet Take 15 mg by mouth daily. 01/31/20  Yes  [provider]  predniSONE (DELTASONE) 10 MG tablet Take 1 tablet (10 mg total) by mouth daily with breakfast. 01/25/20  Yes Love, Ivan Anchors, PA-C  tacrolimus (PROGRAF) 1 MG capsule Take 2 mg by mouth 2 (two) times daily.    Yes [provider]  tamsulosin (FLOMAX) 0.4 MG CAPS capsule Take 1 capsule (0.4 mg total) by mouth daily after breakfast. 01/24/20  Yes Love, Ivan Anchors, PA-C  traZODone (DESYREL) 50 MG tablet Take 0.5-1 tablets (25-50 mg total) by mouth at bedtime as needed for sleep. 01/24/20  Yes Love, Ivan Anchors, PA-C  acetaminophen (TYLENOL) 325 MG tablet Take 1-2 tablets (325-650 mg total) by mouth every 4 (four) hours as needed for mild pain. 01/24/20   Love, Ivan Anchors, PA-C  artificial tears (LACRILUBE) OINT ophthalmic ointment Place into the left eye at bedtime as needed for dry eyes. 03/15/19   Love, Ivan Anchors, PA-C  docusate sodium (COLACE) 100 MG capsule Take 1 capsule (100 mg  total) by mouth 2 (two) times daily. 01/24/20   Love, Ivan Anchors, PA-C  doxycycline (DORYX) 100 MG EC tablet Take 1 tablet (100 mg total) by mouth 2 (two) times daily. Patient not taking: Reported on 03/04/2020 02/13/20   Jamse Arn, MD  furosemide (LASIX) 40 MG tablet Take 1 tablet (40 mg total) by mouth every Monday, Wednesday, and Friday. 01/27/20 01/26/21  Jamse Arn, MD  insulin glargine (LANTUS) 100 UNIT/ML Solostar Pen Inject 20 Units into the skin daily. 01/24/20   Love, Ivan Anchors, PA-C  Insulin Pen Needle 32G X 6 MM MISC 1 application by Does not apply route daily. 01/24/20   Bary Leriche, PA-C    Physical Exam: Vitals:   03/04/20 2000 03/04/20 2030 03/04/20 2100 03/04/20 2250  BP: 115/61 112/65 134/70 (!) 141/69  Pulse: 97 91 98 99  Resp: 15 16 19 20   Temp:    99.1 F (37.3 C)  TempSrc:    Oral  SpO2: 99% 99% 97% 97%  Weight:      Height:       Constitutional: Resting supine in bed, NAD, calm, comfortable Eyes: PERRL, lids and conjunctivae normal ENMT: Mucous membranes are dry. Posterior pharynx clear of any exudate or lesions.Normal dentition.  Neck: normal, supple, no masses. Respiratory: clear to auscultation bilaterally, no wheezing, no crackles. Normal respiratory effort. No accessory muscle use.  Cardiovascular: Irregularly irregular, no murmurs / rubs / gallops. No extremity edema. 2+ pedal pulses. Abdomen: no tenderness, no masses palpated. No hepatosplenomegaly. Bowel sounds positive.  Musculoskeletal: no clubbing / cyanosis. No joint deformity upper and lower extremities. Good ROM, no contractures. Normal muscle tone.  Skin: no rashes, lesions, ulcers. No induration Neurologic: Has expressive aphasia otherwise CN 2-12 grossly intact. Sensation intact, Strength 5/5 in all 4 extremities while resting in bed. Psychiatric: Limited by expressive aphasia however appears alert and oriented x 3. Normal mood.   Labs on Admission: I have personally reviewed following  labs and imaging studies  CBC: Recent Labs  Lab 03/04/20 1628 03/04/20 1748  WBC 16.4*  --   NEUTROABS 15.3*  --   HGB 11.5* 11.6*  HCT 36.0* 34.0*  MCV 80.9  --   PLT 174  --    Basic Metabolic Panel: Recent Labs  Lab 03/04/20 1628 03/04/20 1748  NA 126* 130*  K 4.3 4.5  CL 102  --   CO2 13*  --   GLUCOSE 528*  --   BUN 73*  --  CREATININE 2.92*  --   CALCIUM 8.5*  --    GFR: Estimated Creatinine Clearance: 25.4 mL/min (A) (by C-G formula based on SCr of 2.92 mg/dL (H)). Liver Function Tests: Recent Labs  Lab 03/04/20 1628  AST 12*  ALT 13  ALKPHOS 61  BILITOT 1.1  PROT 6.6  ALBUMIN 2.4*   Recent Labs  Lab 03/04/20 1738  LIPASE 44   No results for input(s): AMMONIA in the last 168 hours. Coagulation Profile: No results for input(s): INR, PROTIME in the last 168 hours. Cardiac Enzymes: No results for input(s): CKTOTAL, CKMB, CKMBINDEX, TROPONINI in the last 168 hours. BNP (last 3 results) No results for input(s): PROBNP in the last 8760 hours. HbA1C: No results for input(s): HGBA1C in the last 72 hours. CBG: Recent Labs  Lab 03/04/20 1637 03/04/20 2022 03/04/20 2143  GLUCAP 440* 461* 405*   Lipid Profile: No results for input(s): CHOL, HDL, LDLCALC, TRIG, CHOLHDL, LDLDIRECT in the last 72 hours. Thyroid Function Tests: No results for input(s): TSH, T4TOTAL, FREET4, T3FREE, THYROIDAB in the last 72 hours. Anemia Panel: No results for input(s): VITAMINB12, FOLATE, FERRITIN, TIBC, IRON, RETICCTPCT in the last 72 hours. Urine analysis:    Component Value Date/Time   COLORURINE YELLOW 01/12/2020 1131   APPEARANCEUR HAZY (A) 01/12/2020 1131   LABSPEC 1.017 01/12/2020 1131   PHURINE 5.0 01/12/2020 1131   GLUCOSEU 150 (A) 01/12/2020 1131   HGBUR MODERATE (A) 01/12/2020 [place] 01/12/2020 [place] 01/12/2020 1131   PROTEINUR 100 (A) 01/12/2020 1131   NITRITE NEGATIVE 01/12/2020 1131   LEUKOCYTESUR TRACE (A)  01/12/2020 1131    Radiological Exams on Admission: CT ABDOMEN PELVIS WO CONTRAST  Result Date: 03/04/2020 CLINICAL DATA:  Low blood pressure with decreased oral intake. EXAM: CT ABDOMEN AND PELVIS WITHOUT CONTRAST TECHNIQUE: Multidetector CT imaging of the abdomen and pelvis was performed following the standard protocol without IV contrast. COMPARISON:  February 27, 2019 FINDINGS: Lower chest: No acute abnormality. Hepatobiliary: No focal liver abnormality is seen. Subcentimeter gallstones are seen within the gallbladder lumen without evidence of gallbladder wall thickening or biliary dilatation. Pancreas: Unremarkable. No pancreatic ductal dilatation or surrounding inflammatory changes. Spleen: Normal in size without focal abnormality. Adrenals/Urinary Tract: Adrenal glands are unremarkable. There is marked severity diffuse bilateral renal cortical thinning of the native kidneys. A stable 1.1 cm x 1.3 cm round hyperdense area is seen within the posterolateral aspect of the mid right kidney. Multiple bilateral subcentimeter non-obstructing renal stones are also seen within the native kidneys. A transplant kidney is seen within the right renal pelvis. This is normal in size without evidence of focal lesions, hydronephrosis or renal calculi. Bladder is unremarkable. Stomach/Bowel: Stomach is within normal limits. Appendix appears normal. No evidence of bowel wall thickening, distention, or inflammatory changes. Numerous noninflamed diverticula are seen throughout the large bowel. Vascular/Lymphatic: There is marked severity calcification of the abdominal aorta. No enlarged abdominal or pelvic lymph nodes. Reproductive: The prostate gland is moderately enlarged. The area of low attenuation seen within the anterior aspect of the prostate gland on the prior study is not clearly identified on the current exam. Other: No abdominal wall hernia or abnormality. No abdominopelvic ascites. Musculoskeletal: Multilevel  degenerative changes are seen throughout the lumbar spine. IMPRESSION: 1. Cholelithiasis without evidence of acute cholecystitis. 2. Marked severity diffuse bilateral renal cortical thinning of the native kidneys with a stable 1.1 cm x 1.3 cm round hyperdense area within the posterolateral aspect of  the mid right kidney which may represent a hemorrhagic cyst. 3. Colonic diverticulosis without evidence of diverticulitis. 4. Transplanted kidney within the pelvis on the right. Aortic Atherosclerosis (ICD10-I70.0). Electronically Signed   By: Virgina Norfolk M.D.   On: 03/04/2020 18:34   DG Chest Portable 1 View  Result Date: 03/04/2020 CLINICAL DATA:  Hypotension. EXAM: PORTABLE CHEST 1 VIEW COMPARISON:  January 11, 2020 FINDINGS: There is no evidence of acute infiltrate, pleural effusion or pneumothorax. The cardiac silhouette is mildly enlarged and unchanged in size. There is moderate severity calcification of the aortic arch. The visualized skeletal structures are unremarkable. IMPRESSION: Stable cardiomegaly without acute or active cardiopulmonary disease. Electronically Signed   By: Virgina Norfolk M.D.   On: 03/04/2020 18:25    EKG: Independently reviewed. Atrial fibrillation, rate 96, wandering leads.  Rate is faster when compared to previous EKG.  Assessment/Plan Principal Problem:   ARF (acute renal failure) (HCC) Active Problems:   Kidney transplant recipient   Diabetes [facility])   Atrial fibrillation, chronic (HCC)   Hypertension associated with diabetes ([place])   History of CVA (cerebrovascular accident)   Hyperlipidemia associated with type 2 diabetes mellitus ([place])  Prosper Paff is a 69 y.o. male with medical history significant for ESRD s/p renal transplant on immunosuppressants, history of CVA with residual expressive aphasia, atrial fibrillation on Eliquis, type 2 diabetes, hypertension, hyperlipidemia, PVD, and BPH who is admitted with acute renal failure.  Acute renal failure in  patient with renal transplant: Suspected prerenal etiology in setting of decreased oral intake and GI losses in addition to some of his home medications. -Continue IV LR @ 100 mL/hr overnight -Monitor strict I/O's and daily weights -Obtain urine for urinalysis, urine sodium and creatinine -Bladder scan and in and out cath as needed -Obtain ultrasound of renal transplant with Doppler -Hold home Metformin, Lasix, nateglinide -Continue Prograf, Myfortic, prednisone 10 mg daily  Hyperglycemia in type 2 diabetes: Without evidence of DKA.  He has been given Lantus and IV NovoLog in the ED.   -Will resume home Lantus 20 units and place on moderate sliding scale insulin with HS coverage and adjust as needed. -Holding home Januvia, Metformin, nateglinide, pioglitazone.  Persistent atrial fibrillation: Remains in atrial fibrillation with controlled rate.   -Continue Eliquis 5 mg twice daily, discussed with pharmacy - no dose adjustment needed at this time -Resume home Lopressor for rate control.  History of CVA with residual expressive aphasia and gait imbalance: Recent admission in April 2021 for aphasia and confusion found to have an evolving subacute left ACA territory infarct with associated petechial hemorrhage.  This was felt due to embolic CVA in setting of atrial fibrillation. -Continue Eliquis -Continue atorvastatin -Continue PT/OT and request SLP eval for expressive aphasia and previously reported difficulty with swallowing  Hypertension: Resuming home Lopressor.  Home Hydralazine and nifedipine on hold with borderline low blood pressures on initial arrival.  Hyperlipidemia: Continue atorvastatin.  BPH: Continue Flomax and Proscar.  DVT prophylaxis: Eliquis Code Status: Full code, confirmed with patient Family Communication: Discussed with patient, family is aware Disposition Plan: From home, discharge pending progress of renal function and hyperglycemia Consults called:  None Admission status:  Status is: Inpatient  Remains inpatient appropriate because:Ongoing diagnostic testing needed not appropriate for outpatient work up, IV treatments appropriate due to intensity of illness or inability to take PO and Inpatient level of care appropriate due to severity of illness   Dispo: The patient is from: Home  Anticipated d/c is to: TBD pending improvement in renal function, hyperglycemia, and PT/OT/SLP eval              Anticipated d/c date is: 2 days              Patient currently is not medically stable to d/c.    Zada Finders MD [facility]  If 7PM-7AM, please contact night-coverage [email or web address]  03/04/2020, 11:39 PM

## 2020-03-04 NOTE — ED Triage Notes (Signed)
Home health nurse stated b/p low today at 1300, wife brought here. Pt alert, baseline confusion, CVA in April.

## 2020-03-04 NOTE — ED Provider Notes (Addendum)
[facility] EMERGENCY DEPARTMENT Provider Note   CSN: [id number] Arrival date & time: 03/04/20  1610     History Chief Complaint  Patient presents with  . Hypotension    Ryan Rice is a 69 y.o. male.  HPI     CVA in April, can state some things but not able to provide history of pain or symptoms  Not eating properly for about 1 week, will take two spoonfuls of food then stop for each meal.  Home health nurse was talking about getting him nutrition shake/glycema. Tried equate and went straight through him.  Abdominal pain began last week epigastric area. No vomiting Gagging some with medications    Bowels chronically loose, tried giving equate Thursday and 2 hours later with diarrhea, having at least 2 times per day, gave half on Monday. No black or bloody stools.  When walking and getting out of bed has seemed more fatigued. Does not appear short of breath.  No leg swelling  Sensipar is only new medication  No fevers, no cough, unknown if urinary symptoms. Past Medical History:  Diagnosis Date  . Allergic rhinitis   . BPH (benign prostatic hyperplasia)   . Chronic atrial fibrillation ([place])   . Diabetes mellitus ([place])   . DVT (deep venous thrombosis) (H[facility])    LLE  . Hematochezia   . HTN (hypertension)   . Hypomagnesemia 03/20/2019  . Kidney transplant recipient   . PVD (peripheral vascular disease) [facility])     Patient Active Problem List   Diagnosis Date Noted  . ARF (acute renal failure) ([place]) 03/04/2020  . History of CVA (cerebrovascular accident) 03/04/2020  . Hyperlipidemia associated with type 2 diabetes mellitus ([place]) 03/04/2020  . Cellulitis of left lower extremity 02/13/2020  . Peripheral edema   . Leukopenia   . Labile blood pressure   . Acute lower UTI   . Leukocytosis   . Thrombocytopenia ([place])   . Hyponatremia   . Steroid-induced hyperglycemia   . Ischemic stroke of frontal lobe ([place]) 01/10/2020  . Embolic infarction ([place])  01/10/2020  . Controlled type 2 diabetes mellitus with hyperglycemia, without long-term current use of insulin ([place])   . Renal transplant, status post   . Hypertension associated with diabetes ([place])   . Cerebral embolism with cerebral infarction 01/03/2020  . Brain tumor ([place]) 12/30/2019  . Hip pain, bilateral 12/30/2019  . Elevated PSA 12/30/2019  . Atrial fibrillation, chronic ([place]) 12/30/2019  . Hypomagnesemia 03/20/2019  . Diabetes ([place]) 03/15/2019  . Glaucoma 03/15/2019  . Scleral hemorrhage of left eye 03/15/2019  . Gout flare 03/15/2019  . Debility 03/08/2019  . AKI (acute kidney injury) ([place])   . CAP (community acquired pneumonia)   . Kidney transplant recipient   . Sepsis ([place]) 02/27/2019    Past Surgical History:  Procedure Laterality Date  . KIDNEY TRANSPLANT         Family History  Problem Relation Age of Onset  . Stroke Mother   . Heart disease Father   . Diabetes Father   . Diabetes Sister   . Diabetes Brother   . Heart disease Brother     Social History   Tobacco Use  . Smoking status: Former Smoker    Types: Cigarettes    Quit date: 03/07/1984    Years since quitting: 36.0  . Smokeless tobacco: Never Used  . Tobacco comment: N/A  Vaping Use  . Vaping Use: Never used  Substance Use Topics  . Alcohol use: Yes  Comment: rare use  . Drug use: Never    Home Medications Prior to Admission medications   Medication Sig Start Date End Date Taking? Authorizing Provider  acetaminophen (TYLENOL) 325 MG tablet Take 1-2 tablets (325-650 mg total) by mouth every 4 (four) hours as needed for mild pain. 01/24/20  Yes Love, Ivan Anchors, PA-C  apixaban (ELIQUIS) 5 MG TABS tablet Take 1 tablet (5 mg total) by mouth 2 (two) times daily. 01/24/20  Yes Love, Ivan Anchors, PA-C  artificial tears (LACRILUBE) OINT ophthalmic ointment Place into the left eye at bedtime as needed for dry eyes. 03/15/19  Yes Love, Ivan Anchors, PA-C  atorvastatin (LIPITOR) 80 MG tablet Take 1  tablet (80 mg total) by mouth daily at 6 PM. 01/24/20  Yes Love, Ivan Anchors, PA-C  brimonidine (ALPHAGAN) 0.2 % ophthalmic solution Place 1 drop into the right eye 2 (two) times daily. 12/05/19  Yes [provider]  cinacalcet (SENSIPAR) 90 MG tablet Take 90 mg by mouth daily.   Yes [provider]  diclofenac Sodium (VOLTAREN) 1 % GEL Apply 4 g topically 2 (two) times daily. Apply to hips and legs 12/21/19  Yes [provider]  dorzolamide-timolol (COSOPT) 22.3-6.8 MG/ML ophthalmic solution Place 1 drop into the right eye 2 (two) times a day. 01/19/16  Yes [provider]  famotidine (PEPCID) 20 MG tablet Take 1 tablet (20 mg total) by mouth daily. Patient taking differently: Take 20 mg by mouth 2 (two) times daily.  01/24/20  Yes Love, Ivan Anchors, PA-C  finasteride (PROSCAR) 5 MG tablet Take 1 tablet (5 mg total) by mouth daily. 01/24/20  Yes Love, Ivan Anchors, PA-C  folic acid (FOLVITE) 1 MG tablet TAKE 1 TABLET BY MOUTH EVERY DAY Patient taking differently: Take 1 mg by mouth daily.  05/27/19  Yes Kirsteins, Luanna Salk, MD  hydrALAZINE (APRESOLINE) 25 MG tablet Take 1 tablet (25 mg total) by mouth every 6 (six) hours. 01/24/20  Yes Love, Ivan Anchors, PA-C  JANUVIA 100 MG tablet Take 100 mg by mouth daily. 02/17/20  Yes [provider]  magnesium oxide (MAG-OX) 400 MG tablet Take 1 tablet (400 mg total) by mouth daily. 01/24/20  Yes Love, Ivan Anchors, PA-C  metFORMIN (GLUCOPHAGE-XR) 500 MG 24 hr tablet Take 500 mg by mouth in the morning and at bedtime.   Yes [provider]  metoprolol tartrate (LOPRESSOR) 25 MG tablet Take 0.5 tablets (12.5 mg total) by mouth 2 (two) times daily. 01/24/20  Yes Love, Ivan Anchors, PA-C  mycophenolate (MYFORTIC) 180 MG EC tablet Take 360 mg by mouth 2 (two) times daily.    Yes [provider]  nateglinide (STARLIX) 120 MG tablet Take 120 mg by mouth 2 (two) times daily. <130 -only take 1 tablet 02/17/20  Yes [provider]   NIFEdipine (ADALAT CC) 60 MG 24 hr tablet Take 1 tablet (60 mg total) by mouth daily. 01/24/20  Yes Love, Ivan Anchors, PA-C  pioglitazone (ACTOS) 30 MG tablet Take 15 mg by mouth daily. 01/31/20  Yes [provider]  predniSONE (DELTASONE) 10 MG tablet Take 1 tablet (10 mg total) by mouth daily with breakfast. 01/25/20  Yes Love, Ivan Anchors, PA-C  tacrolimus (PROGRAF) 1 MG capsule Take 2 mg by mouth 2 (two) times daily.    Yes [provider]  tamsulosin (FLOMAX) 0.4 MG CAPS capsule Take 1 capsule (0.4 mg total) by mouth daily after breakfast. 01/24/20  Yes Love, Ivan Anchors, PA-C  traZODone (DESYREL)  50 MG tablet Take 0.5-1 tablets (25-50 mg total) by mouth at bedtime as needed for sleep. 01/24/20  Yes Love, Ivan Anchors, PA-C  docusate sodium (COLACE) 100 MG capsule Take 1 capsule (100 mg total) by mouth 2 (two) times daily. Patient not taking: Reported on 03/04/2020 01/24/20   Love, Ivan Anchors, PA-C  doxycycline (DORYX) 100 MG EC tablet Take 1 tablet (100 mg total) by mouth 2 (two) times daily. Patient not taking: Reported on 03/04/2020 02/13/20   Jamse Arn, MD  furosemide (LASIX) 40 MG tablet Take 1 tablet (40 mg total) by mouth every Monday, Wednesday, and Friday. Patient not taking: Reported on 03/04/2020 01/27/20 01/26/21  Jamse Arn, MD  insulin glargine (LANTUS) 100 UNIT/ML Solostar Pen Inject 20 Units into the skin daily. 01/24/20   Love, Ivan Anchors, PA-C  Insulin Pen Needle 32G X 6 MM MISC 1 application by Does not apply route daily. 01/24/20   Love, Ivan Anchors, PA-C    Allergies    Iodinated diagnostic agents and Tape  Review of Systems   Review of Systems  Constitutional: Positive for activity change, appetite change and fatigue. Negative for fever.  HENT: Negative for sore throat.   Eyes: Negative for visual disturbance.  Respiratory: Negative for cough and shortness of breath.   Cardiovascular: Negative for chest pain.  Gastrointestinal: Positive for abdominal pain and diarrhea.  Negative for constipation and vomiting.  Genitourinary: Negative for difficulty urinating.  Musculoskeletal: Negative for back pain and neck stiffness.  Skin: Positive for wound. Negative for rash.  Neurological: Negative for syncope and headaches.    Physical Exam Updated Vital Signs BP 124/74 (BP Location: Right Arm)   Pulse 95   Temp 98.6 F (37 C) (Oral)   Resp 20   Ht 5\' 11"  (1.803 m)   Wt 75.5 kg   SpO2 99%   BMI 23.21 kg/m   Physical Exam Vitals and nursing note reviewed.  Constitutional:      General: He is not in acute distress.    Appearance: He is well-developed. He is not diaphoretic.  HENT:     Head: Normocephalic and atraumatic.  Eyes:     Conjunctiva/sclera: Conjunctivae normal.  Cardiovascular:     Rate and Rhythm: Normal rate. Rhythm irregular.     Heart sounds: Normal heart sounds. No murmur heard.  No friction rub. No gallop.   Pulmonary:     Effort: Pulmonary effort is normal. No respiratory distress.     Breath sounds: Rales present. No wheezing.  Abdominal:     General: There is no distension.     Palpations: Abdomen is soft.     Tenderness: There is no abdominal tenderness. There is no guarding.  Musculoskeletal:     Cervical back: Normal range of motion.  Skin:    General: Skin is warm and dry.     Comments: 2x1.5cm sacral wound,no fluctuance or surrounding erythema  Neurological:     Mental Status: He is alert.     Comments: Oriented to self and location (baseline)     ED Results / Procedures / Treatments   Labs (all labs ordered are listed, but only abnormal results are displayed) Labs Reviewed  CBC WITH DIFFERENTIAL/PLATELET - Abnormal; Notable for the following components:      Result Value   WBC 16.4 (*)    Hemoglobin 11.5 (*)    HCT 36.0 (*)    MCH 25.8 (*)    RDW 16.4 (*)    Neutro  Abs 15.3 (*)    Lymphs Abs 0.5 (*)    Abs Immature Granulocytes 0.15 (*)    All other components within normal limits  COMPREHENSIVE  METABOLIC PANEL - Abnormal; Notable for the following components:   Sodium 126 (*)    CO2 13 (*)    Glucose, Bld 528 (*)    BUN 73 (*)    Creatinine, Ser 2.92 (*)    Calcium 8.5 (*)    Albumin 2.4 (*)    AST 12 (*)    GFR calc non Af Amer 21 (*)    GFR calc Af Amer 24 (*)    All other components within normal limits  BRAIN NATRIURETIC PEPTIDE - Abnormal; Notable for the following components:   B Natriuretic Peptide 1,037.6 (*)    All other components within normal limits  CBC - Abnormal; Notable for the following components:   WBC 10.8 (*)    RBC 3.96 (*)    Hemoglobin 10.1 (*)    HCT 31.4 (*)    MCV 79.3 (*)    MCH 25.5 (*)    RDW 16.2 (*)    Platelets 113 (*)    All other components within normal limits  BASIC METABOLIC PANEL - Abnormal; Notable for the following components:   Sodium 130 (*)    CO2 11 (*)    Glucose, Bld 349 (*)    BUN 69 (*)    Creatinine, Ser 2.33 (*)    GFR calc non Af Amer 27 (*)    GFR calc Af Amer 32 (*)    All other components within normal limits  GLUCOSE, CAPILLARY - Abnormal; Notable for the following components:   Glucose-Capillary 312 (*)    All other components within normal limits  GLUCOSE, CAPILLARY - Abnormal; Notable for the following components:   Glucose-Capillary 305 (*)    All other components within normal limits  CBG MONITORING, ED - Abnormal; Notable for the following components:   Glucose-Capillary 440 (*)    All other components within normal limits  I-STAT VENOUS BLOOD GAS, ED - Abnormal; Notable for the following components:   pCO2, Ven 25.8 (*)    pO2, Ven 16.0 (*)    Bicarbonate 13.2 (*)    TCO2 14 (*)    Acid-base deficit 11.0 (*)    Sodium 130 (*)    HCT 34.0 (*)    Hemoglobin 11.6 (*)    All other components within normal limits  CBG MONITORING, ED - Abnormal; Notable for the following components:   Glucose-Capillary 461 (*)    All other components within normal limits  CBG MONITORING, ED - Abnormal; Notable  for the following components:   Glucose-Capillary 405 (*)    All other components within normal limits  TROPONIN I (HIGH SENSITIVITY) - Abnormal; Notable for the following components:   Troponin I (High Sensitivity) 28 (*)    All other components within normal limits  TROPONIN I (HIGH SENSITIVITY) - Abnormal; Notable for the following components:   Troponin I (High Sensitivity) 25 (*)    All other components within normal limits  SARS CORONAVIRUS 2 BY RT PCR (HOSPITAL ORDER, [facility])  URINE CULTURE  LACTIC ACID, PLASMA  LACTIC ACID, PLASMA  LIPASE, BLOOD  SODIUM, URINE, RANDOM  CREATININE, URINE, RANDOM  BETA-HYDROXYBUTYRIC ACID  URINALYSIS, ROUTINE W REFLEX MICROSCOPIC  HIV ANTIBODY (ROUTINE TESTING W REFLEX)  HEMOGLOBIN A1C    EKG EKG Interpretation  Date/Time:  Wednesday March 04 2020 16:22:43 EDT Ventricular Rate:  96 PR Interval:    QRS Duration: 138 QT Interval:  347 QTC Calculation: 439 R Axis:   27 Text Interpretation: Atrial fibrillation LVH with secondary repolarization abnormality Minimal ST elevation, inferior leads Baseline wander in lead(s) II III aVF V3 Similar ST appearance in inferior leads, TW inversion lateral leads present not on most recent ECG but was present on prior Confirmed by Gareth Morgan [phone]) on 03/04/2020 4:47:08 PM   Radiology CT ABDOMEN PELVIS WO CONTRAST  Result Date: 03/04/2020 CLINICAL DATA:  Low blood pressure with decreased oral intake. EXAM: CT ABDOMEN AND PELVIS WITHOUT CONTRAST TECHNIQUE: Multidetector CT imaging of the abdomen and pelvis was performed following the standard protocol without IV contrast. COMPARISON:  February 27, 2019 FINDINGS: Lower chest: No acute abnormality. Hepatobiliary: No focal liver abnormality is seen. Subcentimeter gallstones are seen within the gallbladder lumen without evidence of gallbladder wall thickening or biliary dilatation. Pancreas: Unremarkable. No pancreatic ductal  dilatation or surrounding inflammatory changes. Spleen: Normal in size without focal abnormality. Adrenals/Urinary Tract: Adrenal glands are unremarkable. There is marked severity diffuse bilateral renal cortical thinning of the native kidneys. A stable 1.1 cm x 1.3 cm round hyperdense area is seen within the posterolateral aspect of the mid right kidney. Multiple bilateral subcentimeter non-obstructing renal stones are also seen within the native kidneys. A transplant kidney is seen within the right renal pelvis. This is normal in size without evidence of focal lesions, hydronephrosis or renal calculi. Bladder is unremarkable. Stomach/Bowel: Stomach is within normal limits. Appendix appears normal. No evidence of bowel wall thickening, distention, or inflammatory changes. Numerous noninflamed diverticula are seen throughout the large bowel. Vascular/Lymphatic: There is marked severity calcification of the abdominal aorta. No enlarged abdominal or pelvic lymph nodes. Reproductive: The prostate gland is moderately enlarged. The area of low attenuation seen within the anterior aspect of the prostate gland on the prior study is not clearly identified on the current exam. Other: No abdominal wall hernia or abnormality. No abdominopelvic ascites. Musculoskeletal: Multilevel degenerative changes are seen throughout the lumbar spine. IMPRESSION: 1. Cholelithiasis without evidence of acute cholecystitis. 2. Marked severity diffuse bilateral renal cortical thinning of the native kidneys with a stable 1.1 cm x 1.3 cm round hyperdense area within the posterolateral aspect of the mid right kidney which may represent a hemorrhagic cyst. 3. Colonic diverticulosis without evidence of diverticulitis. 4. Transplanted kidney within the pelvis on the right. Aortic Atherosclerosis (ICD10-I70.0). Electronically Signed   By: Virgina Norfolk M.D.   On: 03/04/2020 18:34   DG Chest Portable 1 View  Result Date: 03/04/2020 CLINICAL  DATA:  Hypotension. EXAM: PORTABLE CHEST 1 VIEW COMPARISON:  January 11, 2020 FINDINGS: There is no evidence of acute infiltrate, pleural effusion or pneumothorax. The cardiac silhouette is mildly enlarged and unchanged in size. There is moderate severity calcification of the aortic arch. The visualized skeletal structures are unremarkable. IMPRESSION: Stable cardiomegaly without acute or active cardiopulmonary disease. Electronically Signed   By: Virgina Norfolk M.D.   On: 03/04/2020 18:25    Procedures .Critical Care Performed by: Gareth Morgan, MD Authorized by: Gareth Morgan, MD   Critical care provider statement:    Critical care time (minutes):  45   Critical care was time spent personally by me on the following activities:  Discussions with consultants, evaluation of patient's response to treatment, examination of patient, ordering and performing treatments and interventions, ordering and review of laboratory studies, ordering and review of radiographic studies, pulse oximetry,  re-evaluation of patient's condition, obtaining history from patient or surrogate and review of old charts   (including critical care time)  Medications Ordered in ED Medications  sodium chloride flush (NS) 0.9 % injection 3 mL (3 mLs Intravenous Not Given 03/05/20 0859)  lactated ringers infusion ( Intravenous New Bag/Given 03/05/20 0058)  acetaminophen (TYLENOL) tablet 650 mg (has no administration in time range)    Or  acetaminophen (TYLENOL) suppository 650 mg (has no administration in time range)  ondansetron (ZOFRAN) tablet 4 mg (has no administration in time range)    Or  ondansetron (ZOFRAN) injection 4 mg (has no administration in time range)  insulin aspart (novoLOG) injection 0-15 Units (11 Units Subcutaneous Given 03/05/20 0856)  insulin aspart (novoLOG) injection 0-5 Units (0 Units Subcutaneous Not Given 03/05/20 0059)  atorvastatin (LIPITOR) tablet 80 mg (has no administration in time range)    finasteride (PROSCAR) tablet 5 mg (5 mg Oral Given 03/05/20 0900)  metoprolol tartrate (LOPRESSOR) tablet 12.5 mg (has no administration in time range)  mycophenolate (MYFORTIC) EC tablet 360 mg (360 mg Oral Given 03/05/20 0859)  predniSONE (DELTASONE) tablet 10 mg (10 mg Oral Given 03/05/20 0855)  tacrolimus (PROGRAF) capsule 2 mg (2 mg Oral Given 03/05/20 0900)  tamsulosin (FLOMAX) capsule 0.4 mg (0.4 mg Oral Given 03/05/20 0857)  insulin glargine (LANTUS) injection 20 Units (has no administration in time range)  apixaban (ELIQUIS) tablet 5 mg (5 mg Oral Given 03/05/20 0859)  insulin glargine (LANTUS) injection 7 Units (7 Units Subcutaneous Given 03/04/20 1921)  sodium chloride 0.9 % bolus 500 mL (0 mLs Intravenous Stopped 03/04/20 2007)  insulin aspart (novoLOG) injection 7 Units (7 Units Intravenous Given 03/04/20 2054)    ED Course  I have reviewed the triage vital signs and the nursing notes.  Pertinent labs & imaging results that were available during my care of the patient were reviewed by me and considered in my medical decision making (see chart for details).    MDM Rules/Calculators/A&P                      69yo male with history of renal transplant, PVD, htn, DM, DVT, CVA, chronic atrial fibrillation, presents with poor po intake for the last week, loose stool since giving dose of equate on Thursday, abdominal pain, and concern for low blood pressures by home health nurse.   CT abdomen without acute findings-consider symptomatic cholelithiasis as etiology of pain and decreased appetite but history is limited and not clearly related and do not feel emergent evaluation indicated in setting of no cholecystitis.   Labs significant for nonanion gap metabolic acidosis, hyperglycemia, acute kidney injury.  Awaiting UA and betahydroxybutyric acid to determine in setting of hyperglycemia whether there is some element of ketosis or if acidosis is primarily secondary to diarrhea and renal failure.   Ordered bolus of insulin for now. Discussed with nephrology at [facility] given history of transplant there-feel admission to our hospital is appropriate.  Consider rejection as etiology of symptoms, but more likely secondary to dehydration.  BNP elevated on check during triage--likely in setting of renal failure-however in this setting did not aggressively hydrate.   Will admit for further care.   Final Clinical Impression(s) / ED Diagnoses Final diagnoses:  AKI (acute kidney injury) ([place])  Metabolic acidosis  Hyperglycemia  Epigastric pain    Rx / DC Orders ED Discharge Orders    None       Gareth Morgan, MD 03/05/20 [phone]  Gareth Morgan, MD 03/05/20 [phone]

## 2020-03-04 NOTE — Progress Notes (Signed)
Patient arrived as new admit at around 2245H via stretcher. Patient is on room air, NAD. VSS. Patient oriented to the unit and safety protocol. Patient is alert with some confusion as baseline, patient reoriented. He is calm and cooperative, able to convey his needs. Bed in low position, call bell within patient reach.

## 2020-03-04 NOTE — ED Notes (Signed)
Informed by patient's wife that patient has a sore to his buttocks.  Pressure ulcer to right inner buttocks with dressing noted.    Instructed and educated wife that patient has to be turned every 2 hours to prevent pressure sores and both heels has to be off the bed to prevent pressure sores.  Wife verbalized understanding.

## 2020-03-04 NOTE — Progress Notes (Signed)
Dual skin assessment completed with Harriette Bouillon. Patient skin is warm,scaly and dry at BLE. No edema noted. Noted a R buttocks ulcer at stage 1, clean dry and intact, covered with foam. Patient educated to be turned Q2 hours. Denies any complaint of nausea, vomiting or diarrhea. No headache or significant pain noted. Will continue to monitor patient

## 2020-03-04 NOTE — ED Notes (Signed)
Patient assisted from car to [facility] then to room 14.  Patient able to stand turn and pivot 1 person assist.

## 2020-03-05 ENCOUNTER — Encounter (HOSPITAL_COMMUNITY): Payer: Self-pay | Admitting: Internal Medicine

## 2020-03-05 ENCOUNTER — Inpatient Hospital Stay (HOSPITAL_COMMUNITY): Payer: Medicare HMO

## 2020-03-05 LAB — SODIUM, URINE, RANDOM: Sodium, Ur: 27 mmol/L

## 2020-03-05 LAB — GLUCOSE, CAPILLARY
Glucose-Capillary: 312 mg/dL — ABNORMAL HIGH (ref 70–99)
Glucose-Capillary: 305 mg/dL — ABNORMAL HIGH (ref 70–99)
Glucose-Capillary: 205 mg/dL — ABNORMAL HIGH (ref 70–99)
Glucose-Capillary: 307 mg/dL — ABNORMAL HIGH (ref 70–99)
Glucose-Capillary: 238 mg/dL — ABNORMAL HIGH (ref 70–99)
Glucose-Capillary: 199 mg/dL — ABNORMAL HIGH (ref 70–99)

## 2020-03-05 LAB — HEMOGLOBIN A1C
Hgb A1c MFr Bld: 10.9 % — ABNORMAL HIGH (ref 4.8–5.6)
Mean Plasma Glucose: 266 mg/dL

## 2020-03-05 LAB — CBC
HCT: 31.4 % — ABNORMAL LOW (ref 39.0–52.0)
Hemoglobin: 10.1 g/dL — ABNORMAL LOW (ref 13.0–17.0)
MCH: 25.5 pg — ABNORMAL LOW (ref 26.0–34.0)
MCHC: 32.2 g/dL (ref 30.0–36.0)
MCV: 79.3 fL — ABNORMAL LOW (ref 80.0–100.0)
Platelets: 113 10*3/uL — ABNORMAL LOW (ref 150–400)
RBC: 3.96 MIL/uL — ABNORMAL LOW (ref 4.22–5.81)
RDW: 16.2 % — ABNORMAL HIGH (ref 11.5–15.5)
WBC: 10.8 10*3/uL — ABNORMAL HIGH (ref 4.0–10.5)
nRBC: 0 % (ref 0.0–0.2)

## 2020-03-05 LAB — BETA-HYDROXYBUTYRIC ACID: Beta-Hydroxybutyric Acid: 1.29 mmol/L — ABNORMAL HIGH (ref 0.05–0.27)

## 2020-03-05 LAB — BASIC METABOLIC PANEL
Anion gap: 13 (ref 5–15)
BUN: 69 mg/dL — ABNORMAL HIGH (ref 8–23)
CO2: 11 mmol/L — ABNORMAL LOW (ref 22–32)
Calcium: 8.9 mg/dL (ref 8.9–10.3)
Chloride: 106 mmol/L (ref 98–111)
Creatinine, Ser: 2.33 mg/dL — ABNORMAL HIGH (ref 0.61–1.24)
GFR calc Af Amer: 32 mL/min — ABNORMAL LOW (ref >60)
GFR calc non Af Amer: 27 mL/min — ABNORMAL LOW (ref >60)
Glucose, Bld: 349 mg/dL — ABNORMAL HIGH (ref 70–99)
Potassium: 4.1 mmol/L (ref 3.5–5.1)
Sodium: 130 mmol/L — ABNORMAL LOW (ref 135–145)

## 2020-03-05 LAB — HIV ANTIBODY (ROUTINE TESTING W REFLEX): HIV Screen 4th Generation wRfx: NONREACTIVE

## 2020-03-05 LAB — CREATININE, URINE, RANDOM: Creatinine, Urine: 100.85 mg/dL

## 2020-03-05 MED ORDER — MYCOPHENOLATE SODIUM 180 MG PO TBEC
360.0000 mg | DELAYED_RELEASE_TABLET | Freq: Two times a day (BID) | ORAL | Status: DC
  Administered 2020-03-05 – 2020-03-18 (×28): 360 mg via ORAL
  Filled 2020-03-05 (×28): qty 2

## 2020-03-05 MED ORDER — ACETAMINOPHEN 325 MG PO TABS: 650.0000 mg | ORAL_TABLET | Freq: Four times a day (QID) | ORAL | Status: DC | PRN

## 2020-03-05 MED ORDER — CHLORHEXIDINE GLUCONATE 0.12 % MT SOLN
15.0000 mL | Freq: Two times a day (BID) | OROMUCOSAL | Status: DC
  Administered 2020-03-05 – 2020-03-18 (×23): 15 mL via OROMUCOSAL
  Filled 2020-03-05 (×23): qty 15

## 2020-03-05 MED ORDER — ACETAMINOPHEN 650 MG RE SUPP: 650.0000 mg | Freq: Four times a day (QID) | RECTAL | Status: DC | PRN

## 2020-03-05 MED ORDER — TAMSULOSIN HCL 0.4 MG PO CAPS
0.4000 mg | ORAL_CAPSULE | Freq: Every day | ORAL | Status: DC
  Administered 2020-03-05 – 2020-03-18 (×14): 0.4 mg via ORAL
  Filled 2020-03-05 (×14): qty 1

## 2020-03-05 MED ORDER — INSULIN ASPART 100 UNIT/ML ~~LOC~~ SOLN
0.0000 [IU] | Freq: Three times a day (TID) | SUBCUTANEOUS | Status: DC
  Administered 2020-03-05: 5 [IU] via SUBCUTANEOUS
  Administered 2020-03-05 (×2): 11 [IU] via SUBCUTANEOUS
  Administered 2020-03-06 (×2): 8 [IU] via SUBCUTANEOUS
  Administered 2020-03-06: 5 [IU] via SUBCUTANEOUS
  Administered 2020-03-07: 8 [IU] via SUBCUTANEOUS
  Administered 2020-03-07: 3 [IU] via SUBCUTANEOUS
  Administered 2020-03-07: 5 [IU] via SUBCUTANEOUS
  Administered 2020-03-08 (×3): 8 [IU] via SUBCUTANEOUS
  Administered 2020-03-09 (×3): 5 [IU] via SUBCUTANEOUS
  Administered 2020-03-10: 8 [IU] via SUBCUTANEOUS
  Administered 2020-03-10 – 2020-03-11 (×5): 5 [IU] via SUBCUTANEOUS
  Administered 2020-03-12: 8 [IU] via SUBCUTANEOUS
  Administered 2020-03-12 (×2): 5 [IU] via SUBCUTANEOUS
  Administered 2020-03-13: 8 [IU] via SUBCUTANEOUS
  Administered 2020-03-13 (×2): 5 [IU] via SUBCUTANEOUS
  Administered 2020-03-14 (×2): 3 [IU] via SUBCUTANEOUS
  Administered 2020-03-14: 8 [IU] via SUBCUTANEOUS
  Administered 2020-03-15: 3 [IU] via SUBCUTANEOUS
  Administered 2020-03-15: 5 [IU] via SUBCUTANEOUS
  Administered 2020-03-15 – 2020-03-16 (×2): 11 [IU] via SUBCUTANEOUS
  Administered 2020-03-16: 5 [IU] via SUBCUTANEOUS
  Administered 2020-03-16: 11 [IU] via SUBCUTANEOUS
  Administered 2020-03-17: 8 [IU] via SUBCUTANEOUS
  Administered 2020-03-17: 11 [IU] via SUBCUTANEOUS
  Administered 2020-03-17 – 2020-03-18 (×3): 8 [IU] via SUBCUTANEOUS

## 2020-03-05 MED ORDER — FLUCONAZOLE IN SODIUM CHLORIDE 200-0.9 MG/100ML-% IV SOLN
200.0000 mg | Freq: Once | INTRAVENOUS | Status: AC
  Administered 2020-03-05: 200 mg via INTRAVENOUS
  Filled 2020-03-05: qty 100

## 2020-03-05 MED ORDER — ZINC OXIDE 40 % EX OINT
TOPICAL_OINTMENT | Freq: Two times a day (BID) | CUTANEOUS | Status: DC | PRN
  Filled 2020-03-05: qty 57

## 2020-03-05 MED ORDER — FLUCONAZOLE 100 MG PO TABS
100.0000 mg | ORAL_TABLET | Freq: Every day | ORAL | Status: DC
  Administered 2020-03-06: 100 mg via ORAL
  Filled 2020-03-05: qty 1

## 2020-03-05 MED ORDER — APIXABAN 5 MG PO TABS
5.0000 mg | ORAL_TABLET | Freq: Two times a day (BID) | ORAL | Status: DC
  Administered 2020-03-05 – 2020-03-18 (×28): 5 mg via ORAL
  Filled 2020-03-05 (×23): qty 1
  Filled 2020-03-05: qty 2
  Filled 2020-03-05 (×3): qty 1

## 2020-03-05 MED ORDER — INSULIN GLARGINE 100 UNIT/ML ~~LOC~~ SOLN
20.0000 [IU] | Freq: Every day | SUBCUTANEOUS | Status: DC
  Administered 2020-03-05 – 2020-03-06 (×2): 20 [IU] via SUBCUTANEOUS
  Filled 2020-03-05 (×3): qty 0.2

## 2020-03-05 MED ORDER — SODIUM CHLORIDE 0.9 % IV SOLN
INTRAVENOUS | Status: DC | PRN
  Administered 2020-03-05: 250 mL via INTRAVENOUS

## 2020-03-05 MED ORDER — SODIUM BICARBONATE 650 MG PO TABS
1300.0000 mg | ORAL_TABLET | Freq: Three times a day (TID) | ORAL | Status: DC
  Administered 2020-03-05 – 2020-03-15 (×30): 1300 mg via ORAL
  Filled 2020-03-05 (×30): qty 2

## 2020-03-05 MED ORDER — ORAL CARE MOUTH RINSE
15.0000 mL | Freq: Two times a day (BID) | OROMUCOSAL | Status: DC
  Administered 2020-03-06 – 2020-03-18 (×15): 15 mL via OROMUCOSAL

## 2020-03-05 MED ORDER — INSULIN ASPART 100 UNIT/ML ~~LOC~~ SOLN
0.0000 [IU] | Freq: Every day | SUBCUTANEOUS | Status: DC
  Administered 2020-03-06: 3 [IU] via SUBCUTANEOUS
  Administered 2020-03-07: 5 [IU] via SUBCUTANEOUS
  Administered 2020-03-08 – 2020-03-10 (×3): 3 [IU] via SUBCUTANEOUS
  Administered 2020-03-11: 2 [IU] via SUBCUTANEOUS
  Administered 2020-03-12: 4 [IU] via SUBCUTANEOUS
  Administered 2020-03-13: 3 [IU] via SUBCUTANEOUS
  Administered 2020-03-14: 2 [IU] via SUBCUTANEOUS
  Administered 2020-03-15: 3 [IU] via SUBCUTANEOUS
  Administered 2020-03-16: 4 [IU] via SUBCUTANEOUS
  Administered 2020-03-17: 2 [IU] via SUBCUTANEOUS

## 2020-03-05 MED ORDER — NYSTATIN 100000 UNIT/ML MT SUSP: 5.0000 mL | Freq: Four times a day (QID) | OROMUCOSAL | Status: DC

## 2020-03-05 MED ORDER — LOPERAMIDE HCL 2 MG PO CAPS: 2.0000 mg | ORAL_CAPSULE | ORAL | Status: DC | PRN

## 2020-03-05 MED ORDER — PREDNISONE 5 MG PO TABS
10.0000 mg | ORAL_TABLET | Freq: Every day | ORAL | Status: DC
  Administered 2020-03-05 – 2020-03-18 (×14): 10 mg via ORAL
  Filled 2020-03-05 (×14): qty 2

## 2020-03-05 MED ORDER — ONDANSETRON HCL 4 MG/2ML IJ SOLN: 4.0000 mg | Freq: Four times a day (QID) | INTRAMUSCULAR | Status: DC | PRN

## 2020-03-05 MED ORDER — LACTATED RINGERS IV SOLN: INTRAVENOUS | Status: AC

## 2020-03-05 MED ORDER — FINASTERIDE 5 MG PO TABS
5.0000 mg | ORAL_TABLET | Freq: Every day | ORAL | Status: DC
  Administered 2020-03-05 – 2020-03-18 (×14): 5 mg via ORAL
  Filled 2020-03-05 (×14): qty 1

## 2020-03-05 MED ORDER — ONDANSETRON HCL 4 MG PO TABS: 4.0000 mg | ORAL_TABLET | Freq: Four times a day (QID) | ORAL | Status: DC | PRN

## 2020-03-05 MED ORDER — METOPROLOL TARTRATE 25 MG PO TABS
12.5000 mg | ORAL_TABLET | Freq: Two times a day (BID) | ORAL | Status: DC
  Administered 2020-03-06 – 2020-03-18 (×25): 12.5 mg via ORAL
  Filled 2020-03-05 (×25): qty 1

## 2020-03-05 MED ORDER — SODIUM CHLORIDE 0.9% FLUSH
3.0000 mL | Freq: Two times a day (BID) | INTRAVENOUS | Status: DC
  Administered 2020-03-05 – 2020-03-15 (×13): 3 mL via INTRAVENOUS

## 2020-03-05 MED ORDER — ATORVASTATIN CALCIUM 40 MG PO TABS
80.0000 mg | ORAL_TABLET | Freq: Every day | ORAL | Status: DC
  Administered 2020-03-05 – 2020-03-17 (×13): 80 mg via ORAL
  Filled 2020-03-05 (×14): qty 2

## 2020-03-05 MED ORDER — TACROLIMUS 1 MG PO CAPS
2.0000 mg | ORAL_CAPSULE | Freq: Two times a day (BID) | ORAL | Status: DC
  Administered 2020-03-05 (×3): 2 mg via ORAL
  Filled 2020-03-05 (×4): qty 2

## 2020-03-05 MED ORDER — APIXABAN 2.5 MG PO TABS: 2.5000 mg | ORAL_TABLET | Freq: Two times a day (BID) | ORAL | Status: DC

## 2020-03-05 NOTE — Plan of Care (Signed)
  Problem: Urinary Elimination: Goal: Progression of disease will be identified and treated Outcome: Progressing   Problem: Metabolic: Goal: Ability to maintain appropriate glucose levels will improve Outcome: Progressing   Problem: Nutritional: Goal: Maintenance of adequate nutrition will improve Outcome: Progressing Goal: Progress toward achieving an optimal weight will improve Outcome: Progressing   Problem: Tissue Perfusion: Goal: Adequacy of tissue perfusion will improve Outcome: Progressing

## 2020-03-05 NOTE — Consult Note (Signed)
[facility]  INPATIENT CONSULTATION  Reason for Consultation: co management in a patient s/p renal transplant Requesting Provider: Dr. Tawanna Solo  HPI: Ryan Rice is an 69 y.o. 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  He follows with Dr. Dimas Aguas in [place].  He presented to [facility] yesterday with poor po intake, diarrhea, decreased UOP x 1 week.  He reported last UOP a few days prior to arrival.  In the ED initial VS showed HR 101, BP 103/67, T 98.4, 99% ra.  Labs with BUN 73, Cr 2.92, gluc 528, Na 126, K 4.3, Bicarb 13, normal LFTs; WBC 16.4, Hb 11.5.  VBG 7.3/26. COVID neg.  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  UA 1.017, +protein, glucose, blood - 4/4 UA no blood, urine sodium 27, urine cr 100 --> FeNa 0.6%.   ED d/w [facility] renal transplant - probably prerenal, admit here.   He was hydrated with LR.  Oral DM meds including metformin held.  Labs this AM showing cr 2.3, bicarb 11, AG 13, WBC 10.8.  UOP 323mL doc yesterday.  6/10 renal transplant [place]: No hydronephrosis, large prostate.   Wife at bedside the afternoon - says loose large volume stools for several weeks.  No emesis.  Some gagging on pills but getting them all in - specifically no missed immunosuppression.  He has access to water but isn't eating or drinking much, maybe some epigastric stomach pain.  Ambulating with assistance and RW.    He was hospitalized 4/4-4/16 for CVA presumed embolic in setting of A fib, started eliquis and went in inpt rehab through 01/25/20.    PMH: Past Medical History:  Diagnosis Date  . Allergic rhinitis   . BPH (benign prostatic hyperplasia)   . Chronic atrial fibrillation ([place])   . Diabetes mellitus ([place])   . DVT (deep venous thrombosis) (H[facility])    LLE  .  Hematochezia   . HTN (hypertension)   . Hypomagnesemia 03/20/2019  . Kidney transplant recipient   . PVD (peripheral vascular disease) (H[facility])    PSH: Past Surgical History:  Procedure Laterality Date  . KIDNEY TRANSPLANT      Past Medical History:  Diagnosis Date  . Allergic rhinitis   . BPH (benign prostatic hyperplasia)   . Chronic atrial fibrillation ([place])   . Diabetes mellitus ([place])   . DVT (deep venous thrombosis) (HCC)    LLE  . Hematochezia   . HTN (hypertension)   . Hypomagnesemia 03/20/2019  . Kidney transplant recipient   . PVD (peripheral vascular disease) (H[facility])     Medications:  I have reviewed the patient's current medications.  Medications Prior to Admission  Medication Sig Dispense Refill  . acetaminophen (TYLENOL) 325 MG tablet Take 1-2 tablets (325-650 mg total) by mouth every 4 (four) hours as needed for mild pain.    Marland Kitchen apixaban (ELIQUIS) 5 MG TABS tablet Take 1 tablet (5 mg total) by mouth 2 (two) times daily. 60 tablet 0  . artificial tears (LACRILUBE) OINT ophthalmic ointment Place into the left eye at bedtime as needed for dry eyes. 1 Tube 1  . atorvastatin (LIPITOR) 80 MG tablet Take 1 tablet (80 mg total) by mouth daily at 6 PM. 30 tablet 0  . brimonidine (ALPHAGAN) 0.2 % ophthalmic solution Place  1 drop into the right eye 2 (two) times daily.    . cinacalcet (SENSIPAR) 90 MG tablet Take 90 mg by mouth daily.    . diclofenac Sodium (VOLTAREN) 1 % GEL Apply 4 g topically 2 (two) times daily. Apply to hips and legs    . dorzolamide-timolol (COSOPT) 22.3-6.8 MG/ML ophthalmic solution Place 1 drop into the right eye 2 (two) times a day.    . famotidine (PEPCID) 20 MG tablet Take 1 tablet (20 mg total) by mouth daily. (Patient taking differently: Take 20 mg by mouth 2 (two) times daily. ) 30 tablet 1  . finasteride (PROSCAR) 5 MG tablet Take 1 tablet (5 mg total) by mouth daily. 30 tablet 0  . folic acid (FOLVITE) 1 MG tablet TAKE 1 TABLET BY MOUTH EVERY DAY  (Patient taking differently: Take 1 mg by mouth daily. ) 30 tablet 0  . hydrALAZINE (APRESOLINE) 25 MG tablet Take 1 tablet (25 mg total) by mouth every 6 (six) hours. 120 tablet 0  . JANUVIA 100 MG tablet Take 100 mg by mouth daily.    . magnesium oxide (MAG-OX) 400 MG tablet Take 1 tablet (400 mg total) by mouth daily. 30 tablet 0  . metFORMIN (GLUCOPHAGE-XR) 500 MG 24 hr tablet Take 500 mg by mouth in the morning and at bedtime.    . metoprolol tartrate (LOPRESSOR) 25 MG tablet Take 0.5 tablets (12.5 mg total) by mouth 2 (two) times daily. 30 tablet 0  . mycophenolate (MYFORTIC) 180 MG EC tablet Take 360 mg by mouth 2 (two) times daily.     . nateglinide (STARLIX) 120 MG tablet Take 120 mg by mouth 2 (two) times daily. <130 -only take 1 tablet    . NIFEdipine (ADALAT CC) 60 MG 24 hr tablet Take 1 tablet (60 mg total) by mouth daily. 30 tablet 0  . pioglitazone (ACTOS) 30 MG tablet Take 15 mg by mouth daily.    . predniSONE (DELTASONE) 10 MG tablet Take 1 tablet (10 mg total) by mouth daily with breakfast. 30 tablet 0  . tacrolimus (PROGRAF) 1 MG capsule Take 2 mg by mouth 2 (two) times daily.     . tamsulosin (FLOMAX) 0.4 MG CAPS capsule Take 1 capsule (0.4 mg total) by mouth daily after breakfast. 30 capsule 0  . traZODone (DESYREL) 50 MG tablet Take 0.5-1 tablets (25-50 mg total) by mouth at bedtime as needed for sleep. 15 tablet 0  . docusate sodium (COLACE) 100 MG capsule Take 1 capsule (100 mg total) by mouth 2 (two) times daily. (Patient not taking: Reported on 03/04/2020) 60 capsule 0  . doxycycline (DORYX) 100 MG EC tablet Take 1 tablet (100 mg total) by mouth 2 (two) times daily. (Patient not taking: Reported on 03/04/2020) 20 tablet 0  . furosemide (LASIX) 40 MG tablet Take 1 tablet (40 mg total) by mouth every Monday, Wednesday, and Friday. (Patient not taking: Reported on 03/04/2020) 3 tablet 0  . insulin glargine (LANTUS) 100 UNIT/ML Solostar Pen Inject 20 Units into the skin daily. 15 mL  0  . Insulin Pen Needle 32G X 6 MM MISC 1 application by Does not apply route daily. 100 each 0    ALLERGIES:   Allergies  Allergen Reactions  . Iodinated Diagnostic Agents Hives  . Tape Hives    FAM HX: Family History  Problem Relation Age of Onset  . Stroke Mother   . Heart disease Father   . Diabetes Father   . Diabetes Sister   .  Diabetes Brother   . Heart disease Brother     Social History:   reports that he quit smoking about 36 years ago. His smoking use included cigarettes. He has never used smokeless tobacco. He reports current alcohol use. He reports that he does not use drugs.  ROS: unable to fully obtain due to aphasia  Blood pressure 119/63, pulse (!) 118, temperature 97.8 F (36.6 C), temperature source Oral, resp. rate 19, height 5\' 11"  (1.803 m), weight 75.5 kg, SpO2 98 %. PHYSICAL EXAM: Gen: thin man lying flat in bed, no distress  Eyes: aniteric ENT: MM tacky Neck: no JVD CV:  Tachycardic, regular Abd: soft, nontender, thin, had large loose light brown BM, hyperactive BS Lung: normal WOB, clear GU: foley with clear yellow urine, RLQ kidney transplant palpable and nontender Extr:  No edema Neuro: can say no but largely aphasic Skin: warm and dry, some tenting   Results for orders placed or performed during the hospital encounter of 03/04/20 (from the past 48 hour(s))  CBC with Differential     Status: Abnormal   Collection Time: 03/04/20  4:28 PM  Result Value Ref Range   WBC 16.4 (H) 4.0 - 10.5 K/uL   RBC 4.45 4.22 - 5.81 MIL/uL   Hemoglobin 11.5 (L) 13.0 - 17.0 g/dL   HCT 36.0 (L) 39 - 52 %   MCV 80.9 80.0 - 100.0 fL   MCH 25.8 (L) 26.0 - 34.0 pg   MCHC 31.9 30.0 - 36.0 g/dL   RDW 16.4 (H) 11.5 - 15.5 %   Platelets 174 150 - 400 K/uL   nRBC 0.0 0.0 - 0.2 %   Neutrophils Relative % 93 %   Neutro Abs 15.3 (H) 1.7 - 7.7 K/uL   Lymphocytes Relative 3 %   Lymphs Abs 0.5 (L) 0.7 - 4.0 K/uL   Monocytes Relative 3 %   Monocytes Absolute 0.4 0 -  1 K/uL   Eosinophils Relative 0 %   Eosinophils Absolute 0.0 0 - 0 K/uL   Basophils Relative 0 %   Basophils Absolute 0.0 0 - 0 K/uL   Immature Granulocytes 1 %   Abs Immature Granulocytes 0.15 (H) 0.00 - 0.07 K/uL    Comment: Performed at [facility], [address] [place], [place] [address]  Comprehensive metabolic panel     Status: Abnormal   Collection Time: 03/04/20  4:28 PM  Result Value Ref Range   Sodium 126 (L) 135 - 145 mmol/L   Potassium 4.3 3.5 - 5.1 mmol/L   Chloride 102 98 - 111 mmol/L   CO2 13 (L) 22 - 32 mmol/L   Glucose, Bld 528 (HH) 70 - 99 mg/dL    Comment: Glucose reference range applies only to samples taken after fasting for at least 8 hours. CRITICAL RESULT CALLED TO, READ BACK BY AND VERIFIED WITH: SAM COBLE RN @1702  03/04/2020 OLSONM    BUN 73 (H) 8 - 23 mg/dL   Creatinine, Ser 2.92 (H) 0.61 - 1.24 mg/dL   Calcium 8.5 (L) 8.9 - 10.3 mg/dL   Total Protein 6.6 6.5 - 8.1 g/dL   Albumin 2.4 (L) 3.5 - 5.0 g/dL   AST 12 (L) 15 - 41 U/L   ALT 13 0 - 44 U/L   Alkaline Phosphatase 61 38 - 126 U/L   Total Bilirubin 1.1 0.3 - 1.2 mg/dL   GFR calc non Af Amer 21 (L) >60 mL/min   GFR calc Af Amer 24 (L) >  60 mL/min   Anion gap 11 5 - 15    Comment: Performed at [facility], [address] [place], [place] [address]  Troponin I (High Sensitivity)     Status: Abnormal   Collection Time: 03/04/20  4:28 PM  Result Value Ref Range   Troponin I (High Sensitivity) 28 (H) <18 ng/L    Comment: (NOTE) Elevated high sensitivity troponin I (hsTnI) values and significant  changes across serial measurements may suggest ACS but many other  chronic and acute conditions are known to elevate hsTnI results.  Refer to the "Links" section for chest pain algorithms and additional  guidance. Performed at [facility], [address] [place], [place] [address]   Lactic acid, plasma     Status: None   Collection Time: 03/04/20  4:28 PM  Result  Value Ref Range   Lactic Acid, Venous 1.9 0.5 - 1.9 mmol/L    Comment: Performed at [facility], [address] [place], [place] [address]  Brain natriuretic peptide     Status: Abnormal   Collection Time: 03/04/20  4:28 PM  Result Value Ref Range   B Natriuretic Peptide 1,037.6 (H) 0.0 - 100.0 pg/mL    Comment: Performed at [facility], [address] [place], [place] [address]  CBG monitoring, ED     Status: Abnormal   Collection Time: 03/04/20  4:37 PM  Result Value Ref Range   Glucose-Capillary 440 (H) 70 - 99 mg/dL    Comment: Glucose reference range applies only to samples taken after fasting for at least 8 hours.  Lipase, blood     Status: None   Collection Time: 03/04/20  5:38 PM  Result Value Ref Range   Lipase 44 11 - 51 U/L    Comment: Performed at [facility], [address] [place], [place] [address]  Beta-hydroxybutyric acid     Status: Abnormal   Collection Time: 03/04/20  5:39 PM  Result Value Ref Range   Beta-Hydroxybutyric Acid 1.29 (H) 0.05 - 0.27 mmol/L    Comment: Performed at [facility], [place] [address], [place],  [address]  SARS Coronavirus 2 by RT PCR (hospital order, performed in [facility] hospital lab) Nasopharyngeal Nasopharyngeal Swab     Status: None   Collection Time: 03/04/20  5:39 PM   Specimen: Nasopharyngeal Swab  Result Value Ref Range   SARS Coronavirus 2 NEGATIVE NEGATIVE    Comment: (NOTE) SARS-CoV-2 target nucleic acids are NOT DETECTED. The SARS-CoV-2 RNA is generally detectable in upper and lower respiratory specimens during the acute phase of infection. The lowest concentration of SARS-CoV-2 viral copies this assay can detect is 250 copies / mL. A negative result does not preclude SARS-CoV-2 infection and should not be used as the sole basis for treatment or other patient management decisions.  A negative result may occur with improper specimen collection / handling, submission of  specimen other than nasopharyngeal swab, presence of viral mutation(s) within the areas targeted by this assay, and inadequate number of viral copies (<250 copies / mL). A negative result must be combined with clinical observations, patient history, and epidemiological information. Fact Sheet for Patients:   StrictlyIdeas.no Fact Sheet for Healthcare Providers: BankingDealers.co.za This test is not yet approved or cleared  by the [place] FDA and has been authorized for detection and/or diagnosis of SARS-CoV-2 by FDA under an Emergency Use Authorization (EUA).  This  EUA will remain in effect (meaning this test can be used) for the duration of the COVID-19 declaration under Section 564(b)(1) of the Act, 21 U.S.C. section 360bbb-3(b)(1), unless the authorization is terminated or revoked sooner. Performed at [facility], [address] [place], [place] [address]   I-Stat venous blood gas, [facility] ED)     Status: Abnormal   Collection Time: 03/04/20  5:48 PM  Result Value Ref Range   pH, Ven 7.318 7.25 - 7.43   pCO2, Ven 25.8 (L) 44 - 60 mmHg   pO2, Ven 16.0 (LL) 32 - 45 mmHg   Bicarbonate 13.2 (L) 20.0 - 28.0 mmol/L   TCO2 14 (L) 22 - 32 mmol/L   O2 Saturation 19.0 %   Acid-base deficit 11.0 (H) 0.0 - 2.0 mmol/L   Sodium 130 (L) 135 - 145 mmol/L   Potassium 4.5 3.5 - 5.1 mmol/L   Calcium, Ion 1.33 1.15 - 1.40 mmol/L   HCT 34.0 (L) 39 - 52 %   Hemoglobin 11.6 (L) 13.0 - 17.0 g/dL   Collection site IV start    Drawn by Operator    Sample type VENOUS   Lactic acid, plasma     Status: None   Collection Time: 03/04/20  6:21 PM  Result Value Ref Range   Lactic Acid, Venous 1.4 0.5 - 1.9 mmol/L    Comment: Performed at [facility], [address] [place], [place] [address]  Troponin I (High Sensitivity)     Status: Abnormal   Collection Time: 03/04/20  6:21 PM  Result Value Ref Range   Troponin I (High  Sensitivity) 25 (H) <18 ng/L    Comment: (NOTE) Elevated high sensitivity troponin I (hsTnI) values and significant  changes across serial measurements may suggest ACS but many other  chronic and acute conditions are known to elevate hsTnI results.  Refer to the "Links" section for chest pain algorithms and additional  guidance. Performed at [facility], [address] [place], [place] [address]   POC CBG, ED     Status: Abnormal   Collection Time: 03/04/20  8:22 PM  Result Value Ref Range   Glucose-Capillary 461 (H) 70 - 99 mg/dL    Comment: Glucose reference range applies only to samples taken after fasting for at least 8 hours.  CBG monitoring, ED     Status: Abnormal   Collection Time: 03/04/20  9:43 PM  Result Value Ref Range   Glucose-Capillary 405 (H) 70 - 99 mg/dL    Comment: Glucose reference range applies only to samples taken after fasting for at least 8 hours.  Sodium, urine, random     Status: None   Collection Time: 03/05/20  4:00 AM  Result Value Ref Range   Sodium, Ur 27 mmol/L    Comment: Performed at [facility] [place] [address], [place], [place] [address]  Creatinine, urine, random     Status: None   Collection Time: 03/05/20  4:00 AM  Result Value Ref Range   Creatinine, Urine 100.85 mg/dL    Comment: Performed at [facility], [place] [address] [place], [place] [address]  Glucose, capillary     Status: Abnormal   Collection Time: 03/05/20  5:49 AM  Result Value Ref Range   Glucose-Capillary 312 (H) 70 - 99 mg/dL    Comment: Glucose reference range applies only to samples taken after fasting for at least 8 hours.  HIV Antibody (  routine testing w rflx)     Status: None   Collection Time: 03/05/20  6:48 AM  Result Value Ref Range   HIV Screen 4th Generation wRfx Non Reactive Non Reactive    Comment: Performed at [facility], [place] [address], [place], [place] [address]  CBC     Status: Abnormal    Collection Time: 03/05/20  6:48 AM  Result Value Ref Range   WBC 10.8 (H) 4.0 - 10.5 K/uL   RBC 3.96 (L) 4.22 - 5.81 MIL/uL   Hemoglobin 10.1 (L) 13.0 - 17.0 g/dL   HCT 31.4 (L) 39 - 52 %   MCV 79.3 (L) 80.0 - 100.0 fL   MCH 25.5 (L) 26.0 - 34.0 pg   MCHC 32.2 30.0 - 36.0 g/dL   RDW 16.2 (H) 11.5 - 15.5 %   Platelets 113 (L) 150 - 400 K/uL    Comment: SPECIMEN CHECKED FOR CLOTS Immature Platelet Fraction may be clinically indicated, consider ordering this additional test [id number] PLATELET COUNT CONFIRMED BY SMEAR REPEATED TO VERIFY    nRBC 0.0 0.0 - 0.2 %    Comment: Performed at [facility], [place] [address] [place], [place] [address]  Basic metabolic panel     Status: Abnormal   Collection Time: 03/05/20  6:48 AM  Result Value Ref Range   Sodium 130 (L) 135 - 145 mmol/L   Potassium 4.1 3.5 - 5.1 mmol/L   Chloride 106 98 - 111 mmol/L   CO2 11 (L) 22 - 32 mmol/L   Glucose, Bld 349 (H) 70 - 99 mg/dL    Comment: Glucose reference range applies only to samples taken after fasting for at least 8 hours.   BUN 69 (H) 8 - 23 mg/dL   Creatinine, Ser 2.33 (H) 0.61 - 1.24 mg/dL   Calcium 8.9 8.9 - 10.3 mg/dL   GFR calc non Af Amer 27 (L) >60 mL/min   GFR calc Af Amer 32 (L) >60 mL/min   Anion gap 13 5 - 15    Comment: Performed at [facility], [place] [address] [place], [place] [address]  Glucose, capillary     Status: Abnormal   Collection Time: 03/05/20  7:37 AM  Result Value Ref Range   Glucose-Capillary 305 (H) 70 - 99 mg/dL    Comment: Glucose reference range applies only to samples taken after fasting for at least 8 hours.  Glucose, capillary     Status: Abnormal   Collection Time: 03/05/20 11:44 AM  Result Value Ref Range   Glucose-Capillary 205 (H) 70 - 99 mg/dL    Comment: Glucose reference range applies only to samples taken after fasting for at least 8 hours.    CT ABDOMEN PELVIS WO CONTRAST  Result Date: 03/04/2020 CLINICAL  DATA:  Low blood pressure with decreased oral intake. EXAM: CT ABDOMEN AND PELVIS WITHOUT CONTRAST TECHNIQUE: Multidetector CT imaging of the abdomen and pelvis was performed following the standard protocol without IV contrast. COMPARISON:  February 27, 2019 FINDINGS: Lower chest: No acute abnormality. Hepatobiliary: No focal liver abnormality is seen. Subcentimeter gallstones are seen within the gallbladder lumen without evidence of gallbladder wall thickening or biliary dilatation. Pancreas: Unremarkable. No pancreatic ductal dilatation or surrounding inflammatory changes. Spleen: Normal in size without focal abnormality. Adrenals/Urinary Tract: Adrenal glands are unremarkable. There is marked severity diffuse bilateral renal cortical thinning of the native kidneys. A stable 1.1 cm x 1.3 cm round hyperdense area is seen within the posterolateral  aspect of the mid right kidney. Multiple bilateral subcentimeter non-obstructing renal stones are also seen within the native kidneys. A transplant kidney is seen within the right renal pelvis. This is normal in size without evidence of focal lesions, hydronephrosis or renal calculi. Bladder is unremarkable. Stomach/Bowel: Stomach is within normal limits. Appendix appears normal. No evidence of bowel wall thickening, distention, or inflammatory changes. Numerous noninflamed diverticula are seen throughout the large bowel. Vascular/Lymphatic: There is marked severity calcification of the abdominal aorta. No enlarged abdominal or pelvic lymph nodes. Reproductive: The prostate gland is moderately enlarged. The area of low attenuation seen within the anterior aspect of the prostate gland on the prior study is not clearly identified on the current exam. Other: No abdominal wall hernia or abnormality. No abdominopelvic ascites. Musculoskeletal: Multilevel degenerative changes are seen throughout the lumbar spine. IMPRESSION: 1. Cholelithiasis without evidence of acute cholecystitis.  2. Marked severity diffuse bilateral renal cortical thinning of the native kidneys with a stable 1.1 cm x 1.3 cm round hyperdense area within the posterolateral aspect of the mid right kidney which may represent a hemorrhagic cyst. 3. Colonic diverticulosis without evidence of diverticulitis. 4. Transplanted kidney within the pelvis on the right. Aortic Atherosclerosis (ICD10-I70.0). Electronically Signed   By: Virgina Norfolk M.D.   On: 03/04/2020 18:34   US Renal Transplant w/Doppler  Result Date: 03/05/2020 CLINICAL DATA:  Acute kidney injury. Previous kidney transplant. History of hypertension, diabetes. EXAM: ULTRASOUND OF RENAL TRANSPLANT WITH RENAL DOPPLER ULTRASOUND TECHNIQUE: Ultrasound examination of the renal transplant was performed with gray-scale, color and duplex doppler evaluation. COMPARISON:  01/07/2020 FINDINGS: Transplant kidney location: RLQ Transplant Kidney: Renal measurements: 10.7 x 6.3 x 6.6 cm = volume: 28mL. Normal in size and parenchymal echogenicity. No evidence of mass or hydronephrosis. No peri-transplant fluid collection seen. Color flow in the main renal artery:  Yes Color flow in the main renal vein:  Yes Duplex Doppler Evaluation: Main Renal Artery Resistive Index: 0.82 Venous waveform in main renal vein:  Present Intrarenal resistive index in upper pole:  0.75 (normal 0.6-0.8; equivocal 0.8-0.9; abnormal >= 0.9) Intrarenal resistive index in lower pole: 0.75 (normal 0.6-0.8; equivocal 0.8-0.9; abnormal >= 0.9) Bladder: Normal for degree of bladder distention. Other findings:  Enlarged prostate IMPRESSION: 1. Stable right transplant kidney.  No hydronephrosis. 2. Prostate enlargement Electronically Signed   By: Lucrezia Europe M.D.   On: 03/05/2020 09:59   DG Chest Portable 1 View  Result Date: 03/04/2020 CLINICAL DATA:  Hypotension. EXAM: PORTABLE CHEST 1 VIEW COMPARISON:  January 11, 2020 FINDINGS: There is no evidence of acute infiltrate, pleural effusion or pneumothorax.  The cardiac silhouette is mildly enlarged and unchanged in size. There is moderate severity calcification of the aortic arch. The visualized skeletal structures are unremarkable. IMPRESSION: Stable cardiomegaly without acute or active cardiopulmonary disease. Electronically Signed   By: Virgina Norfolk M.D.   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  Recent baseline renal function 1-1.5 during 12/2019 admission, now with AKI cr 2.9 which is most likely hypovolemia in setting of V/D/poor po intake.  FeNa low, improving with IV fluids.  Cont IVF today.  Cont maintenance immunosuppression tac, myfortic, pred; will check tac trough in am.  Will need to make sure can maintain po intake prior to discharge.   **Metabolic acidosis:  Baseline mild metabolic acidosis with serum bicarb <20 during 12/2019 admission, now  with bicarb 11.  Multifactorial - AKI, CNI, diarrhea.  VBG ok yesterday.  Start po bicarb 1300 TID.   **dysphagia:  Being treated with fluconazole for thrush, c/f candida esophagitis.  Speech following.    **Diarrhea, abd pain:  Per primary.  **HTN: BP fine on meds.   **Hematuria: wasn't present in 12/2019, suspect foley +BPH, has outpt urologist who can follow up.  Urine in bag isn't visibly bloody.  Justin Mend 03/05/2020, 12:44 PM

## 2020-03-05 NOTE — Progress Notes (Signed)
PROGRESS NOTE    Ryan Rice  XKG:[id number] DOB: 22-Dec-1950 DOA: 03/04/2020 PCP: Mina Marble, PA-C   Brief Narrative: Patient is a 69 year old male with history of ESRD status post redo left renal transplant on numerous ablations, history of CVA with residual expressive aphasia, proximal A. fib on Eliquis, diabetes mellitus, hypertension, hyperlipidemia, peripheral vascular disease, BPH who presents for the evaluation of low blood pressure, decreased oral intake, epigastric pain.  He reported poor oral intake for a week.  He also reported decreased urinary output for couple of days ago.  Also complained of diarrhea.  On presentation he was hemodynamically stable.  He was found to have AKI with creatinine of 2.9.  His baseline creatinine is normal.  Started on IV fluids.  Nephrology also consulted.  Assessment & Plan:   Principal Problem:   ARF (acute renal failure) (HCC) Active Problems:   Kidney transplant recipient   Diabetes [facility])   Atrial fibrillation, chronic (HCC)   Hypertension associated with diabetes ([place])   History of CVA (cerebrovascular accident)   Hyperlipidemia associated with type 2 diabetes mellitus ([place])   AKI on a renal transplant patient: Baseline creatinine normal.  His creatinine was 1.01 on 01/24/2020.  Presented with creatinine of 2.9.  Most likely prerenal AKI associated  with decreased oral intake.  Started on IV fluids.  Nephrology consulted.  ESRD status post renal transplant: Follows with nephrology at [facility].  On Prograf, Myfortic, prednisone which we will continue.  Odynophagia/dysphagia: Followed by speech.  Recommended dysphagia 3 iet.  High suspicion for candidal esophagitis due to his immunocompromised status.Marland Kitchen  He was found to have oral candidiasis.  He presented with decreased oral intake.  Will start on fluconazole.  Persistent A. fib: Currently rate is controlled.  On Eliquis 5 mg twice a day for  anticoagulation.  Debility/deconditioning/generalized weakness/history of CVA: Has residual expressive aphasia and gait imbalance.  He was admitted in April 2021 and was found to have evolving subacute left ACA territory infarct with petechial hemorrhage.  On Eliquis and Lipitor at home.  PT/OT recommend discussing facility.  Hypertension: Continue current medications.  Monitor blood pressure  Hyperlipidemia: Continue Lipitor  BPH: On Flomax and Proscar.         DVT prophylaxis:Eliquis Code Status: Full Family Communication: None present at the bedside Status is: Inpatient  Remains inpatient appropriate because:IV treatments appropriate due to intensity of illness or inability to take PO   Dispo: The patient is from: Home              Anticipated d/c is to: SNF              Anticipated d/c date is: 3 days              Patient currently is not medically stable to d/c.     Consultants: Nephrology  Procedures:None  Antimicrobials:  Anti-infectives (From admission, onward)   None      Subjective: Patient seen and examined at the bedside this morning.  Hemodynamically stable.  Sitting in the chair.  Looks more comfortable today.  Says he feels better.  Denies any specific complaints.  Very poor historian.  Looks very weak and deconditioned.  Objective: Vitals:   03/05/20 0631 03/05/20 1024 03/05/20 1026 03/05/20 1039  BP: 124/74 119/63    Pulse: 95 (!) 101 (!) 118   Resp: 20 18  19   Temp: 98.6 F (37 C) 97.8 F (36.6 C)    TempSrc: Oral Oral  SpO2: 99% 98%    Weight:      Height:        Intake/Output Summary (Last 24 hours) at 03/05/2020 1340 Last data filed at 03/05/2020 1052 Gross per 24 hour  Intake 640.77 ml  Output 360 ml  Net 280.77 ml   Filed Weights   03/04/20 1619 03/05/20 0500  Weight: 75.5 kg 75.5 kg    Examination:  General exam: Debilitated, deconditioned, chronically ill looking  HEENT:PERRL,Oral mucosa moist, Ear/Nose normal on gross  exam Respiratory system: Bilateral equal air entry, normal vesicular breath sounds, no wheezes or crackles  Cardiovascular system: S1 & S2 heard, RRR. No JVD, murmurs, rubs, gallops or clicks. No pedal edema. Gastrointestinal system: Abdomen is nondistended, soft and nontender. No organomegaly or masses felt. Normal bowel sounds heard. Central nervous system: Alert and oriented. No focal neurological deficits. Extremities: No edema, no clubbing ,no cyanosis, AV fistula on the left upper arm Skin: No rashes, lesions or ulcers,no icterus ,no pallor   Data Reviewed: I have personally reviewed following labs and imaging studies  CBC: Recent Labs  Lab 03/04/20 1628 03/04/20 1748 03/05/20 0648  WBC 16.4*  --  10.8*  NEUTROABS 15.3*  --   --   HGB 11.5* 11.6* 10.1*  HCT 36.0* 34.0* 31.4*  MCV 80.9  --  79.3*  PLT 174  --  829*   Basic Metabolic Panel: Recent Labs  Lab 03/04/20 1628 03/04/20 1748 03/05/20 0648  NA 126* 130* 130*  K 4.3 4.5 4.1  CL 102  --  106  CO2 13*  --  11*  GLUCOSE 528*  --  349*  BUN 73*  --  69*  CREATININE 2.92*  --  2.33*  CALCIUM 8.5*  --  8.9   GFR: Estimated Creatinine Clearance: 31.9 mL/min (A) (by C-G formula based on SCr of 2.33 mg/dL (H)). Liver Function Tests: Recent Labs  Lab 03/04/20 1628  AST 12*  ALT 13  ALKPHOS 61  BILITOT 1.1  PROT 6.6  ALBUMIN 2.4*   Recent Labs  Lab 03/04/20 1738  LIPASE 44   No results for input(s): AMMONIA in the last 168 hours. Coagulation Profile: No results for input(s): INR, PROTIME in the last 168 hours. Cardiac Enzymes: No results for input(s): CKTOTAL, CKMB, CKMBINDEX, TROPONINI in the last 168 hours. BNP (last 3 results) No results for input(s): PROBNP in the last 8760 hours. HbA1C: No results for input(s): HGBA1C in the last 72 hours. CBG: Recent Labs  Lab 03/04/20 2143 03/05/20 0549 03/05/20 0737 03/05/20 1144 03/05/20 1310  GLUCAP 405* 312* 305* 205* 307*   Lipid Profile: No  results for input(s): CHOL, HDL, LDLCALC, TRIG, CHOLHDL, LDLDIRECT in the last 72 hours. Thyroid Function Tests: No results for input(s): TSH, T4TOTAL, FREET4, T3FREE, THYROIDAB in the last 72 hours. Anemia Panel: No results for input(s): VITAMINB12, FOLATE, FERRITIN, TIBC, IRON, RETICCTPCT in the last 72 hours. Sepsis Labs: Recent Labs  Lab 03/04/20 1628 03/04/20 1821  LATICACIDVEN 1.9 1.4    Recent Results (from the past 240 hour(s))  SARS Coronavirus 2 by RT PCR (hospital order, performed in [facility] hospital lab) Nasopharyngeal Nasopharyngeal Swab     Status: None   Collection Time: 03/04/20  5:39 PM   Specimen: Nasopharyngeal Swab  Result Value Ref Range Status   SARS Coronavirus 2 NEGATIVE NEGATIVE Final    Comment: (NOTE) SARS-CoV-2 target nucleic acids are NOT DETECTED. The SARS-CoV-2 RNA is generally detectable in upper and lower respiratory specimens during  the acute phase of infection. The lowest concentration of SARS-CoV-2 viral copies this assay can detect is 250 copies / mL. A negative result does not preclude SARS-CoV-2 infection and should not be used as the sole basis for treatment or other patient management decisions.  A negative result may occur with improper specimen collection / handling, submission of specimen other than nasopharyngeal swab, presence of viral mutation(s) within the areas targeted by this assay, and inadequate number of viral copies (<250 copies / mL). A negative result must be combined with clinical observations, patient history, and epidemiological information. Fact Sheet for Patients:   StrictlyIdeas.no Fact Sheet for Healthcare Providers: BankingDealers.co.za This test is not yet approved or cleared  by the [place] FDA and has been authorized for detection and/or diagnosis of SARS-CoV-2 by FDA under an Emergency Use Authorization (EUA).  This EUA will remain in effect (meaning this  test can be used) for the duration of the COVID-19 declaration under Section 564(b)(1) of the Act, 21 U.S.C. section 360bbb-3(b)(1), unless the authorization is terminated or revoked sooner. Performed at [facility], [address] [place], [place] [address]          Radiology Studies: CT ABDOMEN PELVIS WO CONTRAST  Result Date: 03/04/2020 CLINICAL DATA:  Low blood pressure with decreased oral intake. EXAM: CT ABDOMEN AND PELVIS WITHOUT CONTRAST TECHNIQUE: Multidetector CT imaging of the abdomen and pelvis was performed following the standard protocol without IV contrast. COMPARISON:  February 27, 2019 FINDINGS: Lower chest: No acute abnormality. Hepatobiliary: No focal liver abnormality is seen. Subcentimeter gallstones are seen within the gallbladder lumen without evidence of gallbladder wall thickening or biliary dilatation. Pancreas: Unremarkable. No pancreatic ductal dilatation or surrounding inflammatory changes. Spleen: Normal in size without focal abnormality. Adrenals/Urinary Tract: Adrenal glands are unremarkable. There is marked severity diffuse bilateral renal cortical thinning of the native kidneys. A stable 1.1 cm x 1.3 cm round hyperdense area is seen within the posterolateral aspect of the mid right kidney. Multiple bilateral subcentimeter non-obstructing renal stones are also seen within the native kidneys. A transplant kidney is seen within the right renal pelvis. This is normal in size without evidence of focal lesions, hydronephrosis or renal calculi. Bladder is unremarkable. Stomach/Bowel: Stomach is within normal limits. Appendix appears normal. No evidence of bowel wall thickening, distention, or inflammatory changes. Numerous noninflamed diverticula are seen throughout the large bowel. Vascular/Lymphatic: There is marked severity calcification of the abdominal aorta. No enlarged abdominal or pelvic lymph nodes. Reproductive: The prostate gland is moderately enlarged.  The area of low attenuation seen within the anterior aspect of the prostate gland on the prior study is not clearly identified on the current exam. Other: No abdominal wall hernia or abnormality. No abdominopelvic ascites. Musculoskeletal: Multilevel degenerative changes are seen throughout the lumbar spine. IMPRESSION: 1. Cholelithiasis without evidence of acute cholecystitis. 2. Marked severity diffuse bilateral renal cortical thinning of the native kidneys with a stable 1.1 cm x 1.3 cm round hyperdense area within the posterolateral aspect of the mid right kidney which may represent a hemorrhagic cyst. 3. Colonic diverticulosis without evidence of diverticulitis. 4. Transplanted kidney within the pelvis on the right. Aortic Atherosclerosis (ICD10-I70.0). Electronically Signed   By: Virgina Norfolk M.D.   On: 03/04/2020 18:34   US Renal Transplant w/Doppler  Result Date: 03/05/2020 CLINICAL DATA:  Acute kidney injury. Previous kidney transplant. History of hypertension, diabetes. EXAM: ULTRASOUND OF RENAL TRANSPLANT WITH RENAL DOPPLER ULTRASOUND TECHNIQUE: Ultrasound examination of the renal  transplant was performed with gray-scale, color and duplex doppler evaluation. COMPARISON:  01/07/2020 FINDINGS: Transplant kidney location: RLQ Transplant Kidney: Renal measurements: 10.7 x 6.3 x 6.6 cm = volume: 278mL. Normal in size and parenchymal echogenicity. No evidence of mass or hydronephrosis. No peri-transplant fluid collection seen. Color flow in the main renal artery:  Yes Color flow in the main renal vein:  Yes Duplex Doppler Evaluation: Main Renal Artery Resistive Index: 0.82 Venous waveform in main renal vein:  Present Intrarenal resistive index in upper pole:  0.75 (normal 0.6-0.8; equivocal 0.8-0.9; abnormal >= 0.9) Intrarenal resistive index in lower pole: 0.75 (normal 0.6-0.8; equivocal 0.8-0.9; abnormal >= 0.9) Bladder: Normal for degree of bladder distention. Other findings:  Enlarged prostate  IMPRESSION: 1. Stable right transplant kidney.  No hydronephrosis. 2. Prostate enlargement Electronically Signed   By: Lucrezia Europe M.D.   On: 03/05/2020 09:59   DG Chest Portable 1 View  Result Date: 03/04/2020 CLINICAL DATA:  Hypotension. EXAM: PORTABLE CHEST 1 VIEW COMPARISON:  January 11, 2020 FINDINGS: There is no evidence of acute infiltrate, pleural effusion or pneumothorax. The cardiac silhouette is mildly enlarged and unchanged in size. There is moderate severity calcification of the aortic arch. The visualized skeletal structures are unremarkable. IMPRESSION: Stable cardiomegaly without acute or active cardiopulmonary disease. Electronically Signed   By: Virgina Norfolk M.D.   On: 03/04/2020 18:25        Scheduled Meds: . apixaban  5 mg Oral BID  . atorvastatin  80 mg Oral q1800  . finasteride  5 mg Oral Daily  . insulin aspart  0-15 Units Subcutaneous TID WC  . insulin aspart  0-5 Units Subcutaneous QHS  . insulin glargine  20 Units Subcutaneous Daily  . [START ON 03/06/2020] metoprolol tartrate  12.5 mg Oral BID  . mycophenolate  360 mg Oral BID  . nystatin  5 mL Oral QID  . predniSONE  10 mg Oral Q breakfast  . sodium chloride flush  3 mL Intravenous Q12H  . tacrolimus  2 mg Oral BID  . tamsulosin  0.4 mg Oral QPC breakfast   Continuous Infusions:   LOS: 1 day    Time spent:35 mins. More than 50% of that time was spent in counseling and/or coordination of care.      Shelly Coss, MD [facility] P6/06/2020, 1:40 PM

## 2020-03-05 NOTE — Progress Notes (Signed)
Pharmacy Antibiotic Note  Ryan Rice is a 69 y.o. male admitted on 03/04/2020 with Esophageal candidiasis  Pharmacy has been consulted for fluconazole dosing.  Plan: Fluconazole 200mg  IV x1 today then Fluconazole 100mg  po daily thereafter D/w nephrology and they will f/u with tacrolimus levels  Height: 5\' 11"  (180.3 cm) Weight: 75.5 kg (166 lb 7.2 oz) IBW/kg (Calculated) : 75.3  Temp (24hrs), Avg:98.7 F (37.1 C), Min:97.8 F (36.6 C), Max:100.5 F (38.1 C)  Recent Labs  Lab 03/04/20 1628 03/04/20 1821 03/05/20 0648  WBC 16.4*  --  10.8*  CREATININE 2.92*  --  2.33*  LATICACIDVEN 1.9 1.4  --     Estimated Creatinine Clearance: 31.9 mL/min (A) (by C-G formula based on SCr of 2.33 mg/dL (H)).    Allergies  Allergen Reactions  . Iodinated Diagnostic Agents Hives  . Tape Hives     Thank you for allowing pharmacy to be a part of this patient's care.  Dolly Rias RPh 03/05/2020, 3:06 PM

## 2020-03-05 NOTE — Evaluation (Addendum)
Clinical/Bedside Swallow Evaluation Patient Details  Name: Ryan Rice MRN: [id number] Date of Birth: 22-Dec-1950  Today's Date: 03/05/2020 Time: SLP Start Time (ACUTE ONLY): 1221 SLP Stop Time (ACUTE ONLY): 1250 SLP Time Calculation (min) (ACUTE ONLY): 29 min  Past Medical History:  Past Medical History:  Diagnosis Date  . Allergic rhinitis   . BPH (benign prostatic hyperplasia)   . Chronic atrial fibrillation ([place])   . Diabetes mellitus ([place])   . DVT (deep venous thrombosis) (HCC)    LLE  . Hematochezia   . HTN (hypertension)   . Hypomagnesemia 03/20/2019  . Kidney transplant recipient   . PVD (peripheral vascular disease) ([place])    Past Surgical History:  Past Surgical History:  Procedure Laterality Date  . KIDNEY TRANSPLANT     HPI:  69 y.o. male with medical history significant for ESRD s/p renal transplant on immunosuppressants,  hospitalized 12/29/19-01/10/20 for CVA with residual expressive aphasia, atrial fibrillation on Eliquis, type 2 diabetes, hypertension, hyperlipidemia, PVD, and BPH who is admitted with acute renal failure..  Pt was treated on CIR for rehab after his CVA.  Pt strokes include left temporoparietal and occipital and anterior left frontal.  Swallow and speech eval ordered.  Pt admits to some coughing with intake prior to amission.  CXR negative for acute dx.   Assessment / Plan / Recommendation Clinical Impression  Pt with right facial asymmetry from prior CVAs and oral candidiasis.  RN made aware of his oral candidiasis.  He is able to feed himself and demonstrated no indication of aspiration with thin and soft solids.  Slow but effective mastication noted with soft solids.  He did overtly cough after swallowing pot roast bolus - indicating he sensed residuals in throat.  He required verbal cue to expectorate. Thus recommend dys3/thin initially.  Pt agreeable to diet modification, of note, pt only consumed a few bites and ceased po.  Poor intake noted when he  was tx at CIR at [facility].  SLP will follow up briefly for po tolerance and SLE.  Given pt brain imaging negative for acute CVA and pt has baseline aphasia - will defer SLE to [facility] or SNF SLP.  SLP Visit Diagnosis: Dysphagia, oropharyngeal phase (R13.12)    Aspiration Risk  Mild aspiration risk    Diet Recommendation Dysphagia 3 (Mech soft);Thin liquid   Liquid Administration via: Cup;Straw Medication Administration:  (as tolerated) Supervision: Patient able to self feed Compensations: Slow rate;Small sips/bites Postural Changes: Remain upright for at least 30 minutes after po intake;Seated upright at 90 degrees    Other  Recommendations Oral Care Recommendations: Oral care BID   Follow up Recommendations Home health SLP      Frequency and Duration min 1 x/week  1 week       Prognosis Prognosis for Safe Diet Advancement: Good      Swallow Study   General Date of Onset: 03/05/20 HPI: 69 y.o. male with medical history significant for ESRD s/p renal transplant on immunosuppressants,  hospitalized 12/29/19-01/10/20 for CVA with residual expressive aphasia, atrial fibrillation on Eliquis, type 2 diabetes, hypertension, hyperlipidemia, PVD, and BPH who is admitted with acute renal failure..  Pt was treated on CIR for rehab after his CVA.  Pt strokes include left temporoparietal and occipital and anterior left frontal.  Swallow and speech eval ordered.  Pt admits to some coughing with intake prior to amission.  CXR negative for acute dx. Type of Study: Bedside Swallow Evaluation Previous Swallow Assessment: prior bse 12/2019 when  pt had cva Diet Prior to this Study: Regular;Thin liquids Temperature Spikes Noted: No Respiratory Status: Room air History of Recent Intubation: No Behavior/Cognition: Alert;Cooperative;Pleasant mood Oral Cavity Assessment: Other (comment) (pt with white patches posterior appearing to be oral candidiasis, rn alerted) Oral Cavity - Dentition: Adequate natural  dentition Vision: Functional for self-feeding Self-Feeding Abilities: Able to feed self Patient Positioning: Upright in bed Baseline Vocal Quality: Normal Volitional Cough: Strong Volitional Swallow: Able to elicit    Oral/Motor/Sensory Function Overall Oral Motor/Sensory Function: Other (comment) (mild right facial asymmetry at baseline from prior cva)   Ice Chips Ice chips: Not tested   Thin Liquid Thin Liquid: Within functional limits Presentation: Straw    Nectar Thick Nectar Thick Liquid: Not tested   Honey Thick Honey Thick Liquid: Not tested   Puree Puree: Within functional limits Presentation: Self Fed;Spoon   Solid     Solid: Impaired Pharyngeal Phase Impairments: Cough - Delayed Other Comments: pt with immediate cough post-swallow and pt indicated sensation of residuals in pharynx, he required cue to expectorate, able to expel meat      [name] 03/05/2020,1:23 PM  [name], MS [place] [facility] [phone]

## 2020-03-05 NOTE — Evaluation (Signed)
Occupational Therapy Evaluation Patient Details Name: Ryan Rice MRN: [id number] DOB: [birth year] Today's Date: 03/05/2020    History of Present Illness 69 y.o. male with medical history significant for ESRD s/p renal transplant on immunosuppressants,  hospitalized 12/29/19-01/10/20 for CVA with residual expressive aphasia, atrial fibrillation on Eliquis, type 2 diabetes, hypertension, hyperlipidemia, PVD, and BPH who is admitted with acute renal failure.   Clinical Impression   Patient with functional deficits listed below impacting safety and independence with self care. Patient with expressive aphasia due to hx CVA, patient D/C from [place] 01/25/20. Unsure how patient was managing at home since discharge as patient is inconsistent with responses/expressive difficulties. Patient requiring x2 assist for bed mobility and functional transfer to chair with decreased balance, strength, activity tolerance. Will continue to follow.    Follow Up Recommendations  SNF;Supervision/Assistance - 24 hour    Equipment Recommendations  Other (comment) (TDB)       Precautions / Restrictions Precautions Precautions: Fall Precaution Comments: aphasia Restrictions Weight Bearing Restrictions: No      Mobility Bed Mobility Overal bed mobility: Needs Assistance Bed Mobility: Supine to Sit     Supine to sit: Max assist;+2 for physical assistance     General bed mobility comments: assist to raise trunk and advance BLEs to edge of bed  Transfers Overall transfer level: Needs assistance Equipment used: Rolling walker (2 wheeled) Transfers: Sit to/from [facility] Sit to Stand: +2 physical assistance;From elevated surface;+2 safety/equipment;Min assist Stand pivot transfers: +2 physical assistance;Mod assist;Min assist;+2 safety/equipment       General transfer comment: assist to rise from elevated bed (pt 75%), assist to control descent as pt attempted to sit/BLEs buckling prior to  reaching recliner; HR 118 with transfer    Balance Overall balance assessment: Needs assistance Sitting-balance support: Feet supported;Bilateral upper extremity supported Sitting balance-Leahy Scale: Poor Sitting balance - Comments: posterior lean when pt only had sincle LE supported on floor   Standing balance support: Bilateral upper extremity supported;During functional activity Standing balance-Leahy Scale: Poor Standing balance comment: relies on BUE support                           ADL either performed or assessed with clinical judgement   ADL Overall ADL's : Needs assistance/impaired Eating/Feeding: Set up;Sitting Eating/Feeding Details (indicate cue type and reason): increased time to bring milk carton to mouth Grooming: Set up;Sitting   Upper Body Bathing: Sitting;Min guard   Lower Body Bathing: Maximal assistance;Sitting/lateral leans;Sit to/from stand   Upper Body Dressing : Min guard;Sitting   Lower Body Dressing: Maximal assistance;Sitting/lateral leans;Sit to/from stand   Toilet Transfer: Minimal assistance;Moderate assistance;+2 for physical assistance;+2 for safety/equipment;Stand-pivot;BSC;Ambulation;RW Toilet Transfer Details (indicate cue type and reason): simulated to recliner, require assist to turn hips and walker, decreased standing balance and activity tolerance  Toileting- Clothing Manipulation and Hygiene: Maximal assistance;Sit to/from stand;Sitting/lateral lean       Functional mobility during ADLs: Minimal assistance;Moderate assistance;+2 for physical assistance;+2 for safety/equipment;Rolling walker;Cueing for sequencing;Cueing for safety                    Pertinent Vitals/Pain Pain Assessment: No/denies pain     Hand Dominance Right (pt state left, drinking from cup with R/per chart R handed)   Extremity/Trunk Assessment Upper Extremity Assessment Upper Extremity Assessment: Generalized weakness   Lower Extremity  Assessment Lower Extremity Assessment: Defer to PT evaluation   Cervical / Trunk Assessment Cervical / Trunk Assessment:  Kyphotic   Communication Communication Communication: Expressive difficulties (aphasia previous CVA)   Cognition Arousal/Alertness: Awake/alert Behavior During Therapy: Flat affect Overall Cognitive Status: Difficult to assess                                 General Comments: able to state his birthdate; unable to state month/year, location, name of president; follows commands inconsistently              Home Living Family/patient expects to be discharged to:: Unsure Living Arrangements: Spouse/significant other                               Additional Comments: no family present to provide history, difficulty obtaining accurate history from patient due to aphasia      Prior Functioning/Environment          Comments: unsure of patient functioning since D/C from [place] in May, patient unable to provide accurate history        OT Problem List: Decreased strength;Decreased activity tolerance;Impaired balance (sitting and/or standing);Decreased cognition;Decreased safety awareness      OT Treatment/Interventions: Self-care/ADL training;Therapeutic exercise;DME and/or AE instruction;Cognitive remediation/compensation;Therapeutic activities;Patient/family education;Balance training    OT Goals(Current goals can be found in the care plan section) Acute Rehab OT Goals Patient Stated Goal: agreeable to chair OT Goal Formulation: With patient Time For Goal Achievement: 03/19/20 Potential to Achieve Goals: Good  OT Frequency: Min 2X/week           Co-evaluation PT/OT/SLP Co-Evaluation/Treatment: Yes Reason for Co-Treatment: For patient/therapist safety;To address functional/ADL transfers;Necessary to address cognition/behavior during functional activity   OT goals addressed during session: ADL's and self-care      AM-PAC OT  "6 Clicks" Daily Activity     Outcome Measure Help from another person eating meals?: A Lingard Help from another person taking care of personal grooming?: A [place] Help from another person toileting, which includes using toliet, bedpan, or urinal?: A Lot Help from another person bathing (including washing, rinsing, drying)?: A Lot Help from another person to put on and taking off regular upper body clothing?: A Depass Help from another person to put on and taking off regular lower body clothing?: A Lot 6 Click Score: 15   End of Session Equipment Utilized During Treatment: Rolling walker;Gait belt Nurse Communication: Mobility status  Activity Tolerance: Patient tolerated treatment well Patient left: in chair;with call bell/phone within reach;with chair alarm set  OT Visit Diagnosis: Unsteadiness on feet (R26.81);Other abnormalities of gait and mobility (R26.89);Muscle weakness (generalized) (M62.81);Other symptoms and signs involving cognitive function                Time: 9485-4627 OT Time Calculation (min): 21 min Charges:  OT General Charges $OT Visit: 1 Visit OT Evaluation $OT Eval Moderate Complexity: [place] OT Pager: [place] 03/05/2020, 2:33 PM

## 2020-03-05 NOTE — Evaluation (Addendum)
Physical Therapy Evaluation Patient Details Name: Ryan Rice MRN: [id number] DOB: 15-Oct-1950 Today's Date: 03/05/2020   History of Present Illness  69 y.o. male with medical history significant for ESRD s/p renal transplant on immunosuppressants,  hospitalized 12/29/19-01/10/20 for CVA with residual expressive aphasia, atrial fibrillation on Eliquis, type 2 diabetes, hypertension, hyperlipidemia, PVD, and BPH who is admitted with acute renal failure.  Clinical Impression  Pt admitted with above diagnosis. Max assist for supine to sit, min assist to stand from elevated bed, min to mod assist to pivot to recliner with RW. Pt's BLEs buckled prior to reaching recliner, assist required to control descent to recliner. 24* assistance recommended. Pt's prior level of function unclear as he is a vague historian, complicated by expressive aphasia and confusion.  Per prior PT notes, he ambulated 180' with a RW on 01/24/20 at [facility]. Pt currently with functional limitations due to the deficits listed below (see PT Problem List). Pt will benefit from skilled PT to increase their independence and safety with mobility to allow discharge to the venue listed below.       Follow Up Recommendations SNF;Supervision/Assistance - 24 hour (SNF if pt lacks 24* assistance at home)    Equipment Recommendations  Other (comment) (TBD)    Recommendations for Other Services       Precautions / Restrictions Precautions Precautions: Fall Restrictions Weight Bearing Restrictions: No      Mobility  Bed Mobility Overal bed mobility: Needs Assistance Bed Mobility: Supine to Sit     Supine to sit: Max assist;+2 for physical assistance     General bed mobility comments: assist to raise trunk and advance BLEs to edge of bed  Transfers Overall transfer level: Needs assistance Equipment used: Rolling walker (2 wheeled) Transfers: Sit to/from [facility] Sit to Stand: +2 physical assistance;From  elevated surface;+2 safety/equipment;Min assist Stand pivot transfers: +2 physical assistance;Mod assist;Min assist;+2 safety/equipment       General transfer comment: assist to rise from elevated bed (pt 75%), assist to control descent as pt attempted to sit/BLEs buckling prior to reaching recliner; HR 118 with transfer  Ambulation/Gait             General Gait Details: unable 2* B knees buckling with stand pivot transfer  Stairs            Wheelchair Mobility    Modified Rankin (Stroke Patients Only)       Balance Overall balance assessment: Needs assistance Sitting-balance support: Feet supported;Bilateral upper extremity supported Sitting balance-Leahy Scale: Poor Sitting balance - Comments: posterior lean when pt only had sincle LE supported on floor   Standing balance support: Bilateral upper extremity supported Standing balance-Leahy Scale: Poor Standing balance comment: relies on BUE support                             Pertinent Vitals/Pain Pain Assessment: No/denies pain    Home Living Family/patient expects to be discharged to:: Unsure                 Additional Comments: no family present and pt inconsistent    Prior Function           Comments: enjoys fishing (info from prior admission)     Hand Dominance        Extremity/Trunk Assessment   Upper Extremity Assessment Upper Extremity Assessment: Defer to OT evaluation    Lower Extremity Assessment Lower Extremity Assessment: Difficult to assess due  to impaired cognition (able to extend B knees actively in sitting but unable to meet resistance for manual muscle test 2* difficulty following commands; unable to flex hips in sitting with verbal/tactile cues and with demonstration)    Cervical / Trunk Assessment Cervical / Trunk Assessment: Kyphotic (neck forward flexed, able to partially correct with cues)  Communication   Communication: Expressive difficulties  (expressive aphasia)  Cognition Arousal/Alertness: Awake/alert Behavior During Therapy: WFL for tasks assessed/performed Overall Cognitive Status: Difficult to assess                                 General Comments: able to state his birthdate; unable to state month/year, location, name of president; follows commands inconsistently      General Comments      Exercises     Assessment/Plan    PT Assessment Patient needs continued PT services  PT Problem List Decreased activity tolerance;Decreased mobility;Decreased balance;Decreased cognition       PT Treatment Interventions Gait training;DME instruction;Therapeutic activities;Therapeutic exercise;Functional mobility training;Patient/family education    PT Goals (Current goals can be found in the Care Plan section)  Acute Rehab PT Goals PT Goal Formulation: Patient unable to participate in goal setting Time For Goal Achievement: 03/19/20 Potential to Achieve Goals: Good    Frequency Min 3X/week   Barriers to discharge        Co-evaluation PT/OT/SLP Co-Evaluation/Treatment: Yes Reason for Co-Treatment: Complexity of the patient's impairments (multi-system involvement);For patient/therapist safety;To address functional/ADL transfers;Necessary to address cognition/behavior during functional activity PT goals addressed during session: Mobility/safety with mobility;Balance;Proper use of DME;Strengthening/ROM         AM-PAC PT "6 Clicks" Mobility  Outcome Measure Help needed turning from your back to your side while in a flat bed without using bedrails?: A Lot Help needed moving from lying on your back to sitting on the side of a flat bed without using bedrails?: A Lot Help needed moving to and from a bed to a chair (including a wheelchair)?: A Lot Help needed standing up from a chair using your arms (e.g., wheelchair or bedside chair)?: A Lot Help needed to walk in hospital room?: Total Help needed climbing  3-5 steps with a railing? : Total 6 Click Score: 10    End of Session Equipment Utilized During Treatment: Gait belt Activity Tolerance: Patient tolerated treatment well Patient left: in chair;with call bell/phone within reach;with chair alarm set Nurse Communication: Mobility status PT Visit Diagnosis: Unsteadiness on feet (R26.81);Difficulty in walking, not elsewhere classified (R26.2);Muscle weakness (generalized) (M62.81)    Time: 0109-3235 PT Time Calculation (min) (ACUTE ONLY): 19 min   Charges:   PT Evaluation $PT Eval Moderate Complexity: 1 Mod         Philomena Doheny PT 03/05/2020  Acute Rehabilitation Services Pager [phone] Office [phone]

## 2020-03-06 LAB — BASIC METABOLIC PANEL
Anion gap: 11 (ref 5–15)
BUN: 67 mg/dL — ABNORMAL HIGH (ref 8–23)
CO2: 16 mmol/L — ABNORMAL LOW (ref 22–32)
Calcium: 9.1 mg/dL (ref 8.9–10.3)
Chloride: 105 mmol/L (ref 98–111)
Creatinine, Ser: 2.13 mg/dL — ABNORMAL HIGH (ref 0.61–1.24)
GFR calc Af Amer: 36 mL/min — ABNORMAL LOW (ref >60)
GFR calc non Af Amer: 31 mL/min — ABNORMAL LOW (ref >60)
Glucose, Bld: 222 mg/dL — ABNORMAL HIGH (ref 70–99)
Potassium: 4 mmol/L (ref 3.5–5.1)
Sodium: 132 mmol/L — ABNORMAL LOW (ref 135–145)

## 2020-03-06 LAB — GLUCOSE, CAPILLARY
Glucose-Capillary: 277 mg/dL — ABNORMAL HIGH (ref 70–99)
Glucose-Capillary: 222 mg/dL — ABNORMAL HIGH (ref 70–99)
Glucose-Capillary: 254 mg/dL — ABNORMAL HIGH (ref 70–99)
Glucose-Capillary: 256 mg/dL — ABNORMAL HIGH (ref 70–99)
Glucose-Capillary: 272 mg/dL — ABNORMAL HIGH (ref 70–99)

## 2020-03-06 MED ORDER — TACROLIMUS 1 MG PO CAPS
1.5000 mg | ORAL_CAPSULE | Freq: Two times a day (BID) | ORAL | Status: DC
  Administered 2020-03-06 – 2020-03-09 (×7): 1.5 mg via ORAL
  Filled 2020-03-06 (×8): qty 1

## 2020-03-06 MED ORDER — INSULIN GLARGINE 100 UNIT/ML ~~LOC~~ SOLN
15.0000 [IU] | Freq: Every day | SUBCUTANEOUS | Status: DC
  Filled 2020-03-06: qty 0.15

## 2020-03-06 MED ORDER — FLUCONAZOLE 100 MG PO TABS
100.0000 mg | ORAL_TABLET | Freq: Every day | ORAL | Status: DC
  Administered 2020-03-07 – 2020-03-10 (×4): 100 mg via ORAL
  Filled 2020-03-06 (×4): qty 1

## 2020-03-06 MED ORDER — SODIUM CHLORIDE 0.9 % IV SOLN: INTRAVENOUS | Status: DC

## 2020-03-06 NOTE — Progress Notes (Signed)
[facility] Progress Note   Subjective:   Feeling better  Objective Vitals:   03/05/20 1405 03/05/20 2242 03/06/20 0559 03/06/20 0937  BP: (!) 99/57 (!) 150/76 132/77 134/82  Pulse: 94 (!) 108 98 95  Resp: 20 16 20    Temp: 98.4 F (36.9 C) 99.6 F (37.6 C) 100.3 F (37.9 C)   TempSrc: Oral Oral Oral   SpO2: 97% 97% 100%   Weight:   69.4 kg   Height:       Physical Exam General: looks a lot stronger than yesterday, drinking water without issue ENT: thrush in cheeks Heart: RRR, no rub Lungs: clear ant Abdomen: soft, nontender, RLQ renal allograft nontender Extremities: no edema, think Dialysis Access: LUE +large aneurysmal AVF with minimal thrill and bruit but pulsatile  Additional Objective Labs: Basic Metabolic Panel: Recent Labs  Lab 03/04/20 1628 03/04/20 1628 03/04/20 1748 03/05/20 0648 03/06/20 0536  NA 126*   < > 130* 130* 132*  K 4.3   < > 4.5 4.1 4.0  CL 102  --   --  106 105  CO2 13*  --   --  11* 16*  GLUCOSE 528*  --   --  349* 222*  BUN 73*  --   --  69* 67*  CREATININE 2.92*  --   --  2.33* 2.13*  CALCIUM 8.5*  --   --  8.9 9.1   < > = values in this interval not displayed.   Liver Function Tests: Recent Labs  Lab 03/04/20 1628  AST 12*  ALT 13  ALKPHOS 61  BILITOT 1.1  PROT 6.6  ALBUMIN 2.4*   Recent Labs  Lab 03/04/20 1738  LIPASE 44   CBC: Recent Labs  Lab 03/04/20 1628 03/04/20 1748 03/05/20 0648  WBC 16.4*  --  10.8*  NEUTROABS 15.3*  --   --   HGB 11.5* 11.6* 10.1*  HCT 36.0* 34.0* 31.4*  MCV 80.9  --  79.3*  PLT 174  --  113*   Blood Culture    Component Value Date/Time   SDES URINE, RANDOM 01/11/2020 1051   SPECREQUEST  01/11/2020 1051    NONE Performed at [facility], [place] [address], [place], [place] [address]    CULT >=100,000 COLONIES/mL PSEUDOMONAS AERUGINOSA (A) 01/11/2020 1051   REPTSTATUS 01/15/2020 FINAL 01/11/2020 1051    Cardiac Enzymes: No results for input(s): CKTOTAL,  CKMB, CKMBINDEX, TROPONINI in the last 168 hours. CBG: Recent Labs  Lab 03/05/20 1644 03/05/20 2237 03/06/20 0747 03/06/20 0933 03/06/20 1148  GLUCAP 238* 199* 277* 222* 254*   Iron Studies: No results for input(s): IRON, TIBC, TRANSFERRIN, FERRITIN in the last 72 hours. @lablastinr3 @ Studies/Results: CT ABDOMEN PELVIS WO CONTRAST  Result Date: 03/04/2020 CLINICAL DATA:  Low blood pressure with decreased oral intake. EXAM: CT ABDOMEN AND PELVIS WITHOUT CONTRAST TECHNIQUE: Multidetector CT imaging of the abdomen and pelvis was performed following the standard protocol without IV contrast. COMPARISON:  February 27, 2019 FINDINGS: Lower chest: No acute abnormality. Hepatobiliary: No focal liver abnormality is seen. Subcentimeter gallstones are seen within the gallbladder lumen without evidence of gallbladder wall thickening or biliary dilatation. Pancreas: Unremarkable. No pancreatic ductal dilatation or surrounding inflammatory changes. Spleen: Normal in size without focal abnormality. Adrenals/Urinary Tract: Adrenal glands are unremarkable. There is marked severity diffuse bilateral renal cortical thinning of the native kidneys. A stable 1.1 cm x 1.3 cm round hyperdense area is seen within the posterolateral aspect of the mid right  kidney. Multiple bilateral subcentimeter non-obstructing renal stones are also seen within the native kidneys. A transplant kidney is seen within the right renal pelvis. This is normal in size without evidence of focal lesions, hydronephrosis or renal calculi. Bladder is unremarkable. Stomach/Bowel: Stomach is within normal limits. Appendix appears normal. No evidence of bowel wall thickening, distention, or inflammatory changes. Numerous noninflamed diverticula are seen throughout the large bowel. Vascular/Lymphatic: There is marked severity calcification of the abdominal aorta. No enlarged abdominal or pelvic lymph nodes. Reproductive: The prostate gland is moderately enlarged.  The area of low attenuation seen within the anterior aspect of the prostate gland on the prior study is not clearly identified on the current exam. Other: No abdominal wall hernia or abnormality. No abdominopelvic ascites. Musculoskeletal: Multilevel degenerative changes are seen throughout the lumbar spine. IMPRESSION: 1. Cholelithiasis without evidence of acute cholecystitis. 2. Marked severity diffuse bilateral renal cortical thinning of the native kidneys with a stable 1.1 cm x 1.3 cm round hyperdense area within the posterolateral aspect of the mid right kidney which may represent a hemorrhagic cyst. 3. Colonic diverticulosis without evidence of diverticulitis. 4. Transplanted kidney within the pelvis on the right. Aortic Atherosclerosis (ICD10-I70.0). Electronically Signed   By: Virgina Norfolk M.D.   On: 03/04/2020 18:34   US Renal Transplant w/Doppler  Result Date: 03/05/2020 CLINICAL DATA:  Acute kidney injury. Previous kidney transplant. History of hypertension, diabetes. EXAM: ULTRASOUND OF RENAL TRANSPLANT WITH RENAL DOPPLER ULTRASOUND TECHNIQUE: Ultrasound examination of the renal transplant was performed with gray-scale, color and duplex doppler evaluation. COMPARISON:  01/07/2020 FINDINGS: Transplant kidney location: RLQ Transplant Kidney: Renal measurements: 10.7 x 6.3 x 6.6 cm = volume: 229mL. Normal in size and parenchymal echogenicity. No evidence of mass or hydronephrosis. No peri-transplant fluid collection seen. Color flow in the main renal artery:  Yes Color flow in the main renal vein:  Yes Duplex Doppler Evaluation: Main Renal Artery Resistive Index: 0.82 Venous waveform in main renal vein:  Present Intrarenal resistive index in upper pole:  0.75 (normal 0.6-0.8; equivocal 0.8-0.9; abnormal >= 0.9) Intrarenal resistive index in lower pole: 0.75 (normal 0.6-0.8; equivocal 0.8-0.9; abnormal >= 0.9) Bladder: Normal for degree of bladder distention. Other findings:  Enlarged prostate  IMPRESSION: 1. Stable right transplant kidney.  No hydronephrosis. 2. Prostate enlargement Electronically Signed   By: Lucrezia Europe M.D.   On: 03/05/2020 09:59   DG Chest Portable 1 View  Result Date: 03/04/2020 CLINICAL DATA:  Hypotension. EXAM: PORTABLE CHEST 1 VIEW COMPARISON:  January 11, 2020 FINDINGS: There is no evidence of acute infiltrate, pleural effusion or pneumothorax. The cardiac silhouette is mildly enlarged and unchanged in size. There is moderate severity calcification of the aortic arch. The visualized skeletal structures are unremarkable. IMPRESSION: Stable cardiomegaly without acute or active cardiopulmonary disease. Electronically Signed   By: Virgina Norfolk M.D.   On: 03/04/2020 18:25   Medications:  sodium chloride 250 mL (03/05/20 1710)   sodium chloride 75 mL/hr at 03/06/20 1118    apixaban  5 mg Oral BID   atorvastatin  80 mg Oral q1800   chlorhexidine  15 mL Mouth Rinse BID   finasteride  5 mg Oral Daily   fluconazole  100 mg Oral Daily   insulin aspart  0-15 Units Subcutaneous TID WC   insulin aspart  0-5 Units Subcutaneous QHS   insulin glargine  20 Units Subcutaneous Daily   mouth rinse  15 mL Mouth Rinse q12n4p   metoprolol tartrate  12.5 mg Oral  BID   mycophenolate  360 mg Oral BID   predniSONE  10 mg Oral Q breakfast   sodium bicarbonate  1,300 mg Oral TID   sodium chloride flush  3 mL Intravenous Q12H   tacrolimus  1.5 mg Oral BID   tamsulosin  0.4 mg Oral QPC breakfast    Assessment/Plan **ESRD s/p renal transplant now with AKI:  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  Recent baseline renal function 1-1.5 during 12/2019 admission, now with AKI cr 2.9 which is most likely hypovolemia in setting of V/D/poor po intake.  FeNa low, improving with IV fluids.  Cont IVF today.  Cont maintenance immunosuppression tac, myfortic, pred.  Tac troughs in 12/2019 in the 9s; in light of fluconazole interaction have empirically  decreased tac by 25% for now.  Will need to make sure can maintain po intake prior to discharge.   **Metabolic acidosis:  Baseline mild metabolic acidosis with serum bicarb <20 during 12/2019 admission,  bicarb 11 this admission.  Multifactorial - AKI, CNI, diarrhea.  VBG ok on admit.  Started po bicarb 1300 TID and serum bicarb improving.   **dysphagia:  Being treated with fluconazole for thrush, c/f candida esophagitis.  Speech following.  Drinking fluids while im in room and seems a lot better  **Diarrhea, abd pain:  Per primary.  **HTN: BP fine on meds.   **Hematuria: wasn't present in 12/2019, suspect foley +BPH, has outpt urologist who can follow up.  Urine in bag wasn't visibly bloody yesterday.  Jannifer Hick MD 03/06/2020, 1:27 PM  [facility] Pager: [phone]

## 2020-03-06 NOTE — Progress Notes (Addendum)
PROGRESS NOTE    Ryan Rice  BOF:[id number] DOB: 1951-03-15 DOA: 03/04/2020 PCP: Mina Marble, PA-C   Brief Narrative: Patient is a 69 year old male with history of ESRD status post left renal transplant , history of CVA with residual expressive aphasia, proximal A. fib on Eliquis, diabetes mellitus type 2, hypertension, hyperlipidemia, peripheral vascular disease, BPH who presents for the evaluation of low blood pressure, decreased oral intake, epigastric pain.  He reported poor oral intake for a week.  He also reported decreased urinary output for couple of days ago.  Also complained of diarrhea.  On presentation he was hemodynamically stable.  He was found to have AKI with creatinine of 2.9.  His baseline creatinine is normal.  Started on IV fluids.  Nephrology also consulted.  Physical therapy evaluated him and recommended skilled nursing facility on discharge.  Assessment & Plan:   Principal Problem:   ARF (acute renal failure) (HCC) Active Problems:   Kidney transplant recipient   Diabetes [facility])   Atrial fibrillation, chronic (HCC)   Hypertension associated with diabetes ([place])   History of CVA (cerebrovascular accident)   Hyperlipidemia associated with type 2 diabetes mellitus ([place])   AKI on a renal transplant patient: Baseline creatinine normal.  His creatinine was 1.01 on 01/24/2020.  Presented with creatinine of 2.9.  Most likely prerenal AKI associated  with decreased oral intake.  Started on IV fluids.  Nephrology consulted.  Function is slowly improving  ESRD status post renal transplant: Follows with nephrology at [facility].  On Prograf, Myfortic, prednisone which we will continue.  Odynophagia/dysphagia: Followed by speech.  Recommended dysphagia 3 diet iet.  High suspicion for candidal esophagitis due to his immunocompromised status.Marland Kitchen  He was found to have oral candidiasis.  He presented with decreased oral intake.  We have started on fluconazole which we will  continue for 10 to 14 days course..  Persistent A. fib: Currently rate is controlled.  On Eliquis 5 mg twice a day for anticoagulation.  Debility/deconditioning/generalized weakness/history of CVA: Has residual expressive aphasia and gait imbalance. He is confused at baseline after the stroke as per his wife . He was admitted in April 2021 and was found to have evolving subacute left ACA territory infarct with petechial hemorrhage.  On Eliquis and Lipitor at home.  PT/OT recommend skilled nursing  Facility. He has home health and follows with PT,OT, Speech.Wife is interested on taking him home with home health  Hypertension: Continue current medications.  Monitor blood pressure  Hyperlipidemia: Continue Lipitor  BPH: On Flomax and Proscar.  Diabetes type 2: On insulin and oral medications at home.  Continue current regimen.  Diarrhea: Has slowed down.  Continue imodium as needed.  Low suspicion for an infectious etiology.         DVT prophylaxis:Eliquis Code Status: Full Family Communication:  Wife on phone on 03/06/20 Status is: Inpatient  Remains inpatient appropriate because:IV treatments appropriate due to intensity of illness or inability to take PO   Dispo: The patient is from: Home              Anticipated d/c is to: Home              Anticipated d/c date is: 2 days              Patient currently is not medically stable to d/c. Needs nephrology clearance before discharge.    Consultants: Nephrology  Procedures:None  Antimicrobials:  Anti-infectives (From admission, onward)   Start  Dose/Rate Route Frequency Ordered Stop   03/06/20 1000  fluconazole (DIFLUCAN) tablet 100 mg     Discontinue     100 mg Oral Daily 03/05/20 1503     03/05/20 1600  fluconazole (DIFLUCAN) IVPB 200 mg        200 mg 100 mL/hr over 60 Minutes Intravenous  Once 03/05/20 1503 03/05/20 1813      Subjective: Patient seen and examined at the bedside this morning.  Hemodynamically  stable.  Lying on the bed.  Comfortable.  Denies any new complaints.  Objective: Vitals:   03/05/20 2242 03/06/20 0559 03/06/20 0937 03/06/20 1327  BP: (!) 150/76 132/77 134/82 124/76  Pulse: (!) 108 98 95 100  Resp: 16 20  20   Temp: 99.6 F (37.6 C) 100.3 F (37.9 C)  97.8 F (36.6 C)  TempSrc: Oral Oral  Oral  SpO2: 97% 100%  97%  Weight:  69.4 kg    Height:        Intake/Output Summary (Last 24 hours) at 03/06/2020 1443 Last data filed at 03/06/2020 7989 Gross per 24 hour  Intake 629.48 ml  Output 650 ml  Net -20.52 ml   Filed Weights   03/04/20 1619 03/05/20 0500 03/06/20 0559  Weight: 75.5 kg 75.5 kg 69.4 kg    Examination:   General exam:.  Debilitated,  Deconditioned, chronically looking, weak Respiratory system: Bilateral equal air entry, normal vesicular breath sounds, no wheezes or crackles  Cardiovascular system: S1 & S2 heard, RRR. No JVD, murmurs, rubs, gallops or clicks. Gastrointestinal system: Abdomen is nondistended, soft and nontender. No organomegaly or masses felt. Normal bowel sounds heard. Central nervous system: Alert and awake  Confused at baseline Extremities: No edema, no clubbing ,no cyanosis, AV fistula on the left upper arm Skin: No rashes, lesions,no icterus ,no pallor    Data Reviewed: I have personally reviewed following labs and imaging studies  CBC: Recent Labs  Lab 03/04/20 1628 03/04/20 1748 03/05/20 0648  WBC 16.4*  --  10.8*  NEUTROABS 15.3*  --   --   HGB 11.5* 11.6* 10.1*  HCT 36.0* 34.0* 31.4*  MCV 80.9  --  79.3*  PLT 174  --  211*   Basic Metabolic Panel: Recent Labs  Lab 03/04/20 1628 03/04/20 1748 03/05/20 0648 03/06/20 0536  NA 126* 130* 130* 132*  K 4.3 4.5 4.1 4.0  CL 102  --  106 105  CO2 13*  --  11* 16*  GLUCOSE 528*  --  349* 222*  BUN 73*  --  69* 67*  CREATININE 2.92*  --  2.33* 2.13*  CALCIUM 8.5*  --  8.9 9.1   GFR: Estimated Creatinine Clearance: 32.1 mL/min (A) (by C-G formula based on  SCr of 2.13 mg/dL (H)). Liver Function Tests: Recent Labs  Lab 03/04/20 1628  AST 12*  ALT 13  ALKPHOS 61  BILITOT 1.1  PROT 6.6  ALBUMIN 2.4*   Recent Labs  Lab 03/04/20 1738  LIPASE 44   No results for input(s): AMMONIA in the last 168 hours. Coagulation Profile: No results for input(s): INR, PROTIME in the last 168 hours. Cardiac Enzymes: No results for input(s): CKTOTAL, CKMB, CKMBINDEX, TROPONINI in the last 168 hours. BNP (last 3 results) No results for input(s): PROBNP in the last 8760 hours. HbA1C: Recent Labs    03/05/20 0648  HGBA1C 10.9*   CBG: Recent Labs  Lab 03/05/20 1644 03/05/20 2237 03/06/20 0747 03/06/20 0933 03/06/20 1148  GLUCAP 238* 199* 277*  222* 254*   Lipid Profile: No results for input(s): CHOL, HDL, LDLCALC, TRIG, CHOLHDL, LDLDIRECT in the last 72 hours. Thyroid Function Tests: No results for input(s): TSH, T4TOTAL, FREET4, T3FREE, THYROIDAB in the last 72 hours. Anemia Panel: No results for input(s): VITAMINB12, FOLATE, FERRITIN, TIBC, IRON, RETICCTPCT in the last 72 hours. Sepsis Labs: Recent Labs  Lab 03/04/20 1628 03/04/20 1821  LATICACIDVEN 1.9 1.4    Recent Results (from the past 240 hour(s))  SARS Coronavirus 2 by RT PCR (hospital order, performed in [facility] hospital lab) Nasopharyngeal Nasopharyngeal Swab     Status: None   Collection Time: 03/04/20  5:39 PM   Specimen: Nasopharyngeal Swab  Result Value Ref Range Status   SARS Coronavirus 2 NEGATIVE NEGATIVE Final    Comment: (NOTE) SARS-CoV-2 target nucleic acids are NOT DETECTED. The SARS-CoV-2 RNA is generally detectable in upper and lower respiratory specimens during the acute phase of infection. The lowest concentration of SARS-CoV-2 viral copies this assay can detect is 250 copies / mL. A negative result does not preclude SARS-CoV-2 infection and should not be used as the sole basis for treatment or other patient management decisions.  A negative result may  occur with improper specimen collection / handling, submission of specimen other than nasopharyngeal swab, presence of viral mutation(s) within the areas targeted by this assay, and inadequate number of viral copies (<250 copies / mL). A negative result must be combined with clinical observations, patient history, and epidemiological information. Fact Sheet for Patients:   StrictlyIdeas.no Fact Sheet for Healthcare Providers: BankingDealers.co.za This test is not yet approved or cleared  by the [place] FDA and has been authorized for detection and/or diagnosis of SARS-CoV-2 by FDA under an Emergency Use Authorization (EUA).  This EUA will remain in effect (meaning this test can be used) for the duration of the COVID-19 declaration under Section 564(b)(1) of the Act, 21 U.S.C. section 360bbb-3(b)(1), unless the authorization is terminated or revoked sooner. Performed at [facility], [address], [place], [place] [address]          Radiology Studies: CT ABDOMEN PELVIS WO CONTRAST  Result Date: 03/04/2020 CLINICAL DATA:  Low blood pressure with decreased oral intake. EXAM: CT ABDOMEN AND PELVIS WITHOUT CONTRAST TECHNIQUE: Multidetector CT imaging of the abdomen and pelvis was performed following the standard protocol without IV contrast. COMPARISON:  February 27, 2019 FINDINGS: Lower chest: No acute abnormality. Hepatobiliary: No focal liver abnormality is seen. Subcentimeter gallstones are seen within the gallbladder lumen without evidence of gallbladder wall thickening or biliary dilatation. Pancreas: Unremarkable. No pancreatic ductal dilatation or surrounding inflammatory changes. Spleen: Normal in size without focal abnormality. Adrenals/Urinary Tract: Adrenal glands are unremarkable. There is marked severity diffuse bilateral renal cortical thinning of the native kidneys. A stable 1.1 cm x 1.3 cm round hyperdense area is  seen within the posterolateral aspect of the mid right kidney. Multiple bilateral subcentimeter non-obstructing renal stones are also seen within the native kidneys. A transplant kidney is seen within the right renal pelvis. This is normal in size without evidence of focal lesions, hydronephrosis or renal calculi. Bladder is unremarkable. Stomach/Bowel: Stomach is within normal limits. Appendix appears normal. No evidence of bowel wall thickening, distention, or inflammatory changes. Numerous noninflamed diverticula are seen throughout the large bowel. Vascular/Lymphatic: There is marked severity calcification of the abdominal aorta. No enlarged abdominal or pelvic lymph nodes. Reproductive: The prostate gland is moderately enlarged. The area of low attenuation seen within the  anterior aspect of the prostate gland on the prior study is not clearly identified on the current exam. Other: No abdominal wall hernia or abnormality. No abdominopelvic ascites. Musculoskeletal: Multilevel degenerative changes are seen throughout the lumbar spine. IMPRESSION: 1. Cholelithiasis without evidence of acute cholecystitis. 2. Marked severity diffuse bilateral renal cortical thinning of the native kidneys with a stable 1.1 cm x 1.3 cm round hyperdense area within the posterolateral aspect of the mid right kidney which may represent a hemorrhagic cyst. 3. Colonic diverticulosis without evidence of diverticulitis. 4. Transplanted kidney within the pelvis on the right. Aortic Atherosclerosis (ICD10-I70.0). Electronically Signed   By: Virgina Norfolk M.D.   On: 03/04/2020 18:34   US Renal Transplant w/Doppler  Result Date: 03/05/2020 CLINICAL DATA:  Acute kidney injury. Previous kidney transplant. History of hypertension, diabetes. EXAM: ULTRASOUND OF RENAL TRANSPLANT WITH RENAL DOPPLER ULTRASOUND TECHNIQUE: Ultrasound examination of the renal transplant was performed with gray-scale, color and duplex doppler evaluation.  COMPARISON:  01/07/2020 FINDINGS: Transplant kidney location: RLQ Transplant Kidney: Renal measurements: 10.7 x 6.3 x 6.6 cm = volume: 241mL. Normal in size and parenchymal echogenicity. No evidence of mass or hydronephrosis. No peri-transplant fluid collection seen. Color flow in the main renal artery:  Yes Color flow in the main renal vein:  Yes Duplex Doppler Evaluation: Main Renal Artery Resistive Index: 0.82 Venous waveform in main renal vein:  Present Intrarenal resistive index in upper pole:  0.75 (normal 0.6-0.8; equivocal 0.8-0.9; abnormal >= 0.9) Intrarenal resistive index in lower pole: 0.75 (normal 0.6-0.8; equivocal 0.8-0.9; abnormal >= 0.9) Bladder: Normal for degree of bladder distention. Other findings:  Enlarged prostate IMPRESSION: 1. Stable right transplant kidney.  No hydronephrosis. 2. Prostate enlargement Electronically Signed   By: Lucrezia Europe M.D.   On: 03/05/2020 09:59   DG Chest Portable 1 View  Result Date: 03/04/2020 CLINICAL DATA:  Hypotension. EXAM: PORTABLE CHEST 1 VIEW COMPARISON:  January 11, 2020 FINDINGS: There is no evidence of acute infiltrate, pleural effusion or pneumothorax. The cardiac silhouette is mildly enlarged and unchanged in size. There is moderate severity calcification of the aortic arch. The visualized skeletal structures are unremarkable. IMPRESSION: Stable cardiomegaly without acute or active cardiopulmonary disease. Electronically Signed   By: Virgina Norfolk M.D.   On: 03/04/2020 18:25        Scheduled Meds: . apixaban  5 mg Oral BID  . atorvastatin  80 mg Oral q1800  . chlorhexidine  15 mL Mouth Rinse BID  . finasteride  5 mg Oral Daily  . fluconazole  100 mg Oral Daily  . insulin aspart  0-15 Units Subcutaneous TID WC  . insulin aspart  0-5 Units Subcutaneous QHS  . insulin glargine  20 Units Subcutaneous Daily  . mouth rinse  15 mL Mouth Rinse q12n4p  . metoprolol tartrate  12.5 mg Oral BID  . mycophenolate  360 mg Oral BID  . predniSONE   10 mg Oral Q breakfast  . sodium bicarbonate  1,300 mg Oral TID  . sodium chloride flush  3 mL Intravenous Q12H  . tacrolimus  1.5 mg Oral BID  . tamsulosin  0.4 mg Oral QPC breakfast   Continuous Infusions: . sodium chloride 250 mL (03/05/20 1710)  . sodium chloride 75 mL/hr at 03/06/20 1118     LOS: 2 days    Time spent:35 mins. More than 50% of that time was spent in counseling and/or coordination of care.      Shelly Coss, MD [facility] P6/07/2020, 2:43  PM  

## 2020-03-06 NOTE — Care Management Important Message (Signed)
Important Message  Patient Details IM Letter given to [name] SW Case Manager to present to the Patient Name: Ryan Rice MRN: [id number] Date of Birth: 09/29/1950   Medicare Important Message Given:  Yes     Kerin Salen 03/06/2020, 12:09 PM

## 2020-03-07 DIAGNOSIS — L8990 Pressure ulcer of unspecified site, unspecified stage: Secondary | ICD-10-CM | POA: Insufficient documentation

## 2020-03-07 LAB — CBC WITH DIFFERENTIAL/PLATELET
Abs Immature Granulocytes: 0.14 10*3/uL — ABNORMAL HIGH (ref 0.00–0.07)
Basophils Absolute: 0 10*3/uL (ref 0.0–0.1)
Basophils Relative: 0 %
Eosinophils Absolute: 0 10*3/uL (ref 0.0–0.5)
Eosinophils Relative: 0 %
HCT: 32.8 % — ABNORMAL LOW (ref 39.0–52.0)
Hemoglobin: 10.2 g/dL — ABNORMAL LOW (ref 13.0–17.0)
Immature Granulocytes: 1 %
Lymphocytes Relative: 5 %
Lymphs Abs: 0.5 10*3/uL — ABNORMAL LOW (ref 0.7–4.0)
MCH: 24.8 pg — ABNORMAL LOW (ref 26.0–34.0)
MCHC: 31.1 g/dL (ref 30.0–36.0)
MCV: 79.6 fL — ABNORMAL LOW (ref 80.0–100.0)
Monocytes Absolute: 0.5 10*3/uL (ref 0.1–1.0)
Monocytes Relative: 5 %
Neutro Abs: 9.4 10*3/uL — ABNORMAL HIGH (ref 1.7–7.7)
Neutrophils Relative %: 89 %
Platelets: 123 10*3/uL — ABNORMAL LOW (ref 150–400)
RBC: 4.12 MIL/uL — ABNORMAL LOW (ref 4.22–5.81)
RDW: 16.5 % — ABNORMAL HIGH (ref 11.5–15.5)
WBC: 10.6 10*3/uL — ABNORMAL HIGH (ref 4.0–10.5)
nRBC: 0 % (ref 0.0–0.2)

## 2020-03-07 LAB — BASIC METABOLIC PANEL
Anion gap: 10 (ref 5–15)
BUN: 61 mg/dL — ABNORMAL HIGH (ref 8–23)
CO2: 16 mmol/L — ABNORMAL LOW (ref 22–32)
Calcium: 8.9 mg/dL (ref 8.9–10.3)
Chloride: 108 mmol/L (ref 98–111)
Creatinine, Ser: 1.99 mg/dL — ABNORMAL HIGH (ref 0.61–1.24)
GFR calc Af Amer: 39 mL/min — ABNORMAL LOW (ref >60)
GFR calc non Af Amer: 33 mL/min — ABNORMAL LOW (ref >60)
Glucose, Bld: 240 mg/dL — ABNORMAL HIGH (ref 70–99)
Potassium: 3.9 mmol/L (ref 3.5–5.1)
Sodium: 134 mmol/L — ABNORMAL LOW (ref 135–145)

## 2020-03-07 LAB — GLUCOSE, CAPILLARY
Glucose-Capillary: 216 mg/dL — ABNORMAL HIGH (ref 70–99)
Glucose-Capillary: 198 mg/dL — ABNORMAL HIGH (ref 70–99)
Glucose-Capillary: 266 mg/dL — ABNORMAL HIGH (ref 70–99)
Glucose-Capillary: 357 mg/dL — ABNORMAL HIGH (ref 70–99)

## 2020-03-07 MED ORDER — INSULIN GLARGINE 100 UNIT/ML ~~LOC~~ SOLN
20.0000 [IU] | Freq: Every day | SUBCUTANEOUS | Status: DC
  Administered 2020-03-07 – 2020-03-10 (×4): 20 [IU] via SUBCUTANEOUS
  Filled 2020-03-07 (×4): qty 0.2

## 2020-03-07 NOTE — Progress Notes (Signed)
PROGRESS NOTE    Ryan Rice  ZOX:[id number] DOB: 1951-09-24 DOA: 03/04/2020 PCP: Mina Marble, PA-C    Brief Narrative:Patient is a 69 year old male with history of ESRD status post left renal transplant , history of CVA with residual expressive aphasia, proximal A. fib on Eliquis, diabetes mellitus type 2, hypertension, hyperlipidemia, peripheral vascular disease, BPH who presents for the evaluation of low blood pressure, decreased oral intake, epigastric pain.  He reported poor oral intake for a week.  He also reported decreased urinary output for couple of days ago.  Also complained of diarrhea.  On presentation he was hemodynamically stable.  He was found to have AKI with creatinine of 2.9.  His baseline creatinine is normal.  Started on IV fluids.  Nephrology also consulted.  Physical therapy evaluated him and recommended skilled nursing facility on discharge.  Assessment & Plan:   Principal Problem:   ARF (acute renal failure) (HCC) Active Problems:   Kidney transplant recipient   Diabetes [facility])   Atrial fibrillation, chronic (HCC)   Hypertension associated with diabetes ([place])   History of CVA (cerebrovascular accident)   Hyperlipidemia associated with type 2 diabetes mellitus ([place])   Pressure injury of skin   AKI on a renal transplant patient: Baseline creatinine normal.  His creatinine was 1.01 on 01/24/2020.  Presented with creatinine of 2.9.  Most likely prerenal AKI associated  with decreased oral intake.  Started on IV fluids.  Nephrology consulted.  Function is slowly improving creatinine 1.9 today.  Continue IV fluids at 75 cc an hour.  He is not yet back to his baseline creatinine.  ESRD status post renal transplant: Follows with nephrology at [facility].  On Prograf, Myfortic, prednisone which we will continue.  Odynophagia/dysphagia: Followed by speech.  Recommended dysphagia 3 diet iet.  High suspicion for candidal esophagitis due to his immunocompromised  status.Marland Kitchen  He was found to have oral candidiasis.  He presented with decreased oral intake.  We have started on fluconazole which we will continue for 10 to 14 days course..  Persistent A. fib: Currently rate is controlled.  On Eliquis 5 mg twice a day for anticoagulation.  Debility/deconditioning/generalized weakness/history of CVA: Has residual expressive aphasia and gait imbalance. He is confused at baseline after the stroke as per his wife . He was admitted in April 2021 and was found to have evolving subacute left ACA territory infarct with petechial hemorrhage.  On Eliquis and Lipitor at home.  PT/OT recommend skilled nursing  Facility. He has home health and follows with PT,OT, Speech.Wife is interested on taking him home with home health  Hypertension: His blood pressure is 101/85.   On Lopressor 12.5 twice a day which he needs for rate control.  In addition he is on Flomax which could also be decreasing his blood pressure.    Hyperlipidemia: Continue Lipitor  BPH: On Flomax and Proscar.  Diabetes type 2: On insulin and oral medications at home.  Continue current regimen. CBG (last 3)  Recent Labs    03/06/20 1611 03/06/20 2014 03/07/20 0805  GLUCAP 256* 272* 216*   Lantus increased to 20 units today 03/07/2020.  Diarrhea: Has slowed down.  Continue imodium as needed.  Low suspicion for an infectious etiology  Pressure Injury 03/05/20 Sacrum Unstageable - Full thickness tissue loss in which the base of the injury is covered by slough (yellow, tan, gray, green or brown) and/or eschar (tan, brown or black) in the wound bed. Slough filled ulcer (Active)  03/05/20 1121  Location: Sacrum  Location Orientation:   Staging: Unstageable - Full thickness tissue loss in which the base of the injury is covered by slough (yellow, tan, gray, green or brown) and/or eschar (tan, brown or black) in the wound bed.  Wound Description (Comments): Slough filled ulcer  Present on Admission:  Yes      Estimated body mass index is 21.33 kg/m as calculated from the following:   Height as of this encounter: 5\' 11"  (1.803 m).   Weight as of this encounter: 69.4 kg.   DVT prophylaxis:Eliquis Code Status: Full Family Communication discussed with wife 03/07/2020. Status is: Inpatient  Remains inpatient appropriate because:IV treatments appropriate due to intensity of illness or inability to take PO   Dispo: The patient is from: Home  Anticipated d/c is to: Home  Anticipated d/c date is: 2 days  Patient currently is not medically stable to d/c. Needs nephrology clearance before discharge.    Consultants: Nephrology  Procedures:None  Antimicrobials:  Anti-infectives (From admission, onward)   Start     Dose/Rate Route Frequency Ordered Stop   03/07/20 1000  fluconazole (DIFLUCAN) tablet 100 mg     Discontinue     100 mg Oral Daily 03/06/20 1448 03/15/20 0959   03/06/20 1000  fluconazole (DIFLUCAN) tablet 100 mg  Status:  Discontinued        100 mg Oral Daily 03/05/20 1503 03/06/20 1448   03/05/20 1600  fluconazole (DIFLUCAN) IVPB 200 mg        200 mg 100 mL/hr over 60 Minutes Intravenous  Once 03/05/20 1503 03/05/20 1813          Subjective:  Resting in bed awake answers questions appropriately Objective: Vitals:   03/06/20 2142 03/06/20 2150 03/07/20 0448 03/07/20 0858  BP: (!) 161/98 (!) 161/98 101/85   Pulse: (!) 103 (!) 103 (!) 101   Resp:   17   Temp:   98.5 F (36.9 C)   TempSrc:      SpO2:   98% 99%  Weight:      Height:        Intake/Output Summary (Last 24 hours) at 03/07/2020 1040 Last data filed at 03/07/2020 0300 Gross per 24 hour  Intake 1438.29 ml  Output 500 ml  Net 938.29 ml   Filed Weights   03/04/20 1619 03/05/20 0500 03/06/20 0559  Weight: 75.5 kg 75.5 kg 69.4 kg    Examination:  General exam: Chronically sick appearing and weak Respiratory system: Clear to auscultation.  Respiratory effort normal. Cardiovascular system: S1 & S2 heard, RRR. No JVD, murmurs, rubs, gallops or clicks. No pedal edema. Gastrointestinal system: Abdomen is nondistended, soft and nontender. No organomegaly or masses felt. Normal bowel sounds heard. Central nervous system: Alert and oriented. No focal neurological deficits. Extremities: Symmetric 5 x 5 power. Skin: No rashes, lesions or ulcers Psychiatry: Unable to assess    Data Reviewed: I have personally reviewed following labs and imaging studies  CBC: Recent Labs  Lab 03/04/20 1628 03/04/20 1748 03/05/20 0648 03/07/20 0352  WBC 16.4*  --  10.8* 10.6*  NEUTROABS 15.3*  --   --  9.4*  HGB 11.5* 11.6* 10.1* 10.2*  HCT 36.0* 34.0* 31.4* 32.8*  MCV 80.9  --  79.3* 79.6*  PLT 174  --  113* 315*   Basic Metabolic Panel: Recent Labs  Lab 03/04/20 1628 03/04/20 1748 03/05/20 0648 03/06/20 0536 03/07/20 0352  NA 126* 130* 130* 132* 134*  K 4.3 4.5 4.1 4.0 3.9  CL 102  --  106 105 108  CO2 13*  --  11* 16* 16*  GLUCOSE 528*  --  349* 222* 240*  BUN 73*  --  69* 67* 61*  CREATININE 2.92*  --  2.33* 2.13* 1.99*  CALCIUM 8.5*  --  8.9 9.1 8.9   GFR: Estimated Creatinine Clearance: 34.4 mL/min (A) (by C-G formula based on SCr of 1.99 mg/dL (H)). Liver Function Tests: Recent Labs  Lab 03/04/20 1628  AST 12*  ALT 13  ALKPHOS 61  BILITOT 1.1  PROT 6.6  ALBUMIN 2.4*   Recent Labs  Lab 03/04/20 1738  LIPASE 44   No results for input(s): AMMONIA in the last 168 hours. Coagulation Profile: No results for input(s): INR, PROTIME in the last 168 hours. Cardiac Enzymes: No results for input(s): CKTOTAL, CKMB, CKMBINDEX, TROPONINI in the last 168 hours. BNP (last 3 results) No results for input(s): PROBNP in the last 8760 hours. HbA1C: Recent Labs    03/05/20 0648  HGBA1C 10.9*   CBG: Recent Labs  Lab 03/06/20 0933 03/06/20 1148 03/06/20 1611 03/06/20 2014 03/07/20 0805  GLUCAP 222* 254* 256* 272*  216*   Lipid Profile: No results for input(s): CHOL, HDL, LDLCALC, TRIG, CHOLHDL, LDLDIRECT in the last 72 hours. Thyroid Function Tests: No results for input(s): TSH, T4TOTAL, FREET4, T3FREE, THYROIDAB in the last 72 hours. Anemia Panel: No results for input(s): VITAMINB12, FOLATE, FERRITIN, TIBC, IRON, RETICCTPCT in the last 72 hours. Sepsis Labs: Recent Labs  Lab 03/04/20 1628 03/04/20 1821  LATICACIDVEN 1.9 1.4    Recent Results (from the past 240 hour(s))  SARS Coronavirus 2 by RT PCR (hospital order, performed in [facility] hospital lab) Nasopharyngeal Nasopharyngeal Swab     Status: None   Collection Time: 03/04/20  5:39 PM   Specimen: Nasopharyngeal Swab  Result Value Ref Range Status   SARS Coronavirus 2 NEGATIVE NEGATIVE Final    Comment: (NOTE) SARS-CoV-2 target nucleic acids are NOT DETECTED. The SARS-CoV-2 RNA is generally detectable in upper and lower respiratory specimens during the acute phase of infection. The lowest concentration of SARS-CoV-2 viral copies this assay can detect is 250 copies / mL. A negative result does not preclude SARS-CoV-2 infection and should not be used as the sole basis for treatment or other patient management decisions.  A negative result may occur with improper specimen collection / handling, submission of specimen other than nasopharyngeal swab, presence of viral mutation(s) within the areas targeted by this assay, and inadequate number of viral copies (<250 copies / mL). A negative result must be combined with clinical observations, patient history, and epidemiological information. Fact Sheet for Patients:   StrictlyIdeas.no Fact Sheet for Healthcare Providers: BankingDealers.co.za This test is not yet approved or cleared  by the [place] FDA and has been authorized for detection and/or diagnosis of SARS-CoV-2 by FDA under an Emergency Use Authorization (EUA).  This EUA will  remain in effect (meaning this test can be used) for the duration of the COVID-19 declaration under Section 564(b)(1) of the Act, 21 U.S.C. section 360bbb-3(b)(1), unless the authorization is terminated or revoked sooner. Performed at [facility], [address] [place], [place] [address]          Radiology Studies: No results found.      Scheduled Meds: . apixaban  5 mg Oral BID  . atorvastatin  80 mg Oral q1800  . chlorhexidine  15 mL Mouth Rinse BID  . finasteride  5  mg Oral Daily  . fluconazole  100 mg Oral Daily  . insulin aspart  0-15 Units Subcutaneous TID WC  . insulin aspart  0-5 Units Subcutaneous QHS  . insulin glargine  20 Units Subcutaneous Daily  . mouth rinse  15 mL Mouth Rinse q12n4p  . metoprolol tartrate  12.5 mg Oral BID  . mycophenolate  360 mg Oral BID  . predniSONE  10 mg Oral Q breakfast  . sodium bicarbonate  1,300 mg Oral TID  . sodium chloride flush  3 mL Intravenous Q12H  . tacrolimus  1.5 mg Oral BID  . tamsulosin  0.4 mg Oral QPC breakfast   Continuous Infusions: . sodium chloride 250 mL (03/05/20 1710)  . sodium chloride 75 mL/hr at 03/07/20 0300     LOS: 3 days     Georgette Shell, MD  03/07/2020, 10:40 AM

## 2020-03-07 NOTE — Progress Notes (Signed)
[facility] Progress Note   Subjective:   Feeling better. Odynophagia improved.  Prefers to remain on IVF as he feels po intake still isn't robust.  I/Os 1.4 (1.1 IVF) UOP 500 mL  Objective Vitals:   03/06/20 2150 03/07/20 0448 03/07/20 0858 03/07/20 1115  BP: (!) 161/98 101/85  110/72  Pulse: (!) 103 (!) 101  72  Resp:  17    Temp:  98.5 F (36.9 C)    TempSrc:      SpO2:  98% 99%   Weight:      Height:       Physical Exam General: looks a lot stronger than admission, drinking water without issue ENT: thrush in cheeks is now gone Heart: RRR, no rub Lungs: clear ant Abdomen: soft, nontender, RLQ renal allograft nontender Extremities: no edema, think Dialysis Access: LUE +large aneurysmal AVF with minimal thrill and bruit but pulsatile GU: condom cath with clear yellow urine in bag  Additional Objective Labs: Basic Metabolic Panel: Recent Labs  Lab 03/05/20 0648 03/06/20 0536 03/07/20 0352  NA 130* 132* 134*  K 4.1 4.0 3.9  CL 106 105 108  CO2 11* 16* 16*  GLUCOSE 349* 222* 240*  BUN 69* 67* 61*  CREATININE 2.33* 2.13* 1.99*  CALCIUM 8.9 9.1 8.9   Liver Function Tests: Recent Labs  Lab 03/04/20 1628  AST 12*  ALT 13  ALKPHOS 61  BILITOT 1.1  PROT 6.6  ALBUMIN 2.4*   Recent Labs  Lab 03/04/20 1738  LIPASE 44   CBC: Recent Labs  Lab 03/04/20 1628 03/04/20 1628 03/04/20 1748 03/05/20 0648 03/07/20 0352  WBC 16.4*  --   --  10.8* 10.6*  NEUTROABS 15.3*  --   --   --  9.4*  HGB 11.5*   < > 11.6* 10.1* 10.2*  HCT 36.0*   < > 34.0* 31.4* 32.8*  MCV 80.9  --   --  79.3* 79.6*  PLT 174  --   --  113* 123*   < > = values in this interval not displayed.   Blood Culture    Component Value Date/Time   SDES URINE, RANDOM 01/11/2020 1051   SPECREQUEST  01/11/2020 1051    NONE Performed at [facility] [address] [place], [place] [address]    CULT >=100,000 COLONIES/mL PSEUDOMONAS AERUGINOSA (A) 01/11/2020 1051    REPTSTATUS 01/15/2020 FINAL 01/11/2020 1051    Cardiac Enzymes: No results for input(s): CKTOTAL, CKMB, CKMBINDEX, TROPONINI in the last 168 hours. CBG: Recent Labs  Lab 03/06/20 1148 03/06/20 1611 03/06/20 2014 03/07/20 0805 03/07/20 1202  GLUCAP 254* 256* 272* 216* 198*   Iron Studies: No results for input(s): IRON, TIBC, TRANSFERRIN, FERRITIN in the last 72 hours. @lablastinr3 @ Studies/Results: No results found. Medications: . sodium chloride 250 mL (03/05/20 1710)  . sodium chloride 75 mL/hr at 03/07/20 1239   . apixaban  5 mg Oral BID  . atorvastatin  80 mg Oral q1800  . chlorhexidine  15 mL Mouth Rinse BID  . finasteride  5 mg Oral Daily  . fluconazole  100 mg Oral Daily  . insulin aspart  0-15 Units Subcutaneous TID WC  . insulin aspart  0-5 Units Subcutaneous QHS  . insulin glargine  20 Units Subcutaneous Daily  . mouth rinse  15 mL Mouth Rinse q12n4p  . metoprolol tartrate  12.5 mg Oral BID  . mycophenolate  360 mg Oral BID  . predniSONE  10 mg Oral Q breakfast  .  sodium bicarbonate  1,300 mg Oral TID  . sodium chloride flush  3 mL Intravenous Q12H  . tacrolimus  1.5 mg Oral BID  . 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  Recent baseline renal function 1-1.5 during 12/2019 admission, now with AKI cr 2.9 which is most likely hypovolemia in setting of V/D/poor po intake.  FeNa low, improving with IV fluids.  Cont IVF today but hopefully can hold tomorrow.  Cont maintenance immunosuppression tac, myfortic, pred.  Tac troughs in 12/2019 in the 9s; in light of fluconazole interaction have empirically decreased tac by 25% for now.  Will need to make sure can maintain po intake prior to discharge.  Tac trough pending. Will check another tomorrow (dosed 10a/10p here).   **Metabolic acidosis:  Baseline mild metabolic acidosis with serum bicarb <20 during  12/2019 admission,  bicarb 11 this admission.  Multifactorial - AKI, CNI, diarrhea.  VBG ok on admit.  Started po bicarb 1300 TID and serum bicarb improving.   **dysphagia:  Being treated with fluconazole for thrush, c/f candida esophagitis.  Speech following.  Drinking fluids while im in room and seems a lot better  **Diarrhea, abd pain:  Per primary.  **HTN: BP fine on meds.   **Hematuria: wasn't present in 12/2019, has outpt urologist who can follow up.  Urine in bag wasn't visibly bloody.  Will recheck UA.  Jannifer Hick MD 03/07/2020, 1:07 PM  [facility] Pager: [phone]

## 2020-03-08 LAB — TACROLIMUS LEVEL
Tacrolimus (FK506) - LabCorp: 21.5 ng/mL — ABNORMAL HIGH (ref 2.0–20.0)
Tacrolimus (FK506) - LabCorp: 14.5 ng/mL (ref 2.0–20.0)

## 2020-03-08 LAB — GLUCOSE, CAPILLARY
Glucose-Capillary: 257 mg/dL — ABNORMAL HIGH (ref 70–99)
Glucose-Capillary: 290 mg/dL — ABNORMAL HIGH (ref 70–99)
Glucose-Capillary: 268 mg/dL — ABNORMAL HIGH (ref 70–99)
Glucose-Capillary: 260 mg/dL — ABNORMAL HIGH (ref 70–99)

## 2020-03-08 LAB — RENAL FUNCTION PANEL
Albumin: 1.8 g/dL — ABNORMAL LOW (ref 3.5–5.0)
Anion gap: 10 (ref 5–15)
BUN: 59 mg/dL — ABNORMAL HIGH (ref 8–23)
CO2: 15 mmol/L — ABNORMAL LOW (ref 22–32)
Calcium: 8.5 mg/dL — ABNORMAL LOW (ref 8.9–10.3)
Chloride: 109 mmol/L (ref 98–111)
Creatinine, Ser: 2.11 mg/dL — ABNORMAL HIGH (ref 0.61–1.24)
GFR calc Af Amer: 36 mL/min — ABNORMAL LOW
GFR calc non Af Amer: 31 mL/min — ABNORMAL LOW
Glucose, Bld: 283 mg/dL — ABNORMAL HIGH (ref 70–99)
Phosphorus: 2.1 mg/dL — ABNORMAL LOW (ref 2.5–4.6)
Potassium: 3.8 mmol/L (ref 3.5–5.1)
Sodium: 134 mmol/L — ABNORMAL LOW (ref 135–145)

## 2020-03-08 MED ORDER — MIRTAZAPINE 15 MG PO TABS
7.5000 mg | ORAL_TABLET | Freq: Every day | ORAL | Status: DC
  Administered 2020-03-08 – 2020-03-10 (×3): 7.5 mg via ORAL
  Filled 2020-03-08 (×3): qty 1

## 2020-03-08 NOTE — Progress Notes (Addendum)
[facility] Progress Note   Subjective:   Feeling better. Odynophagia improved.   Feels thirsty, asks for Ginger ale.  Ins not recorded yesterday but UOP was 1L.  Was on MIVF 75/hr yesterday, stopped this AM.  Objective Vitals:   03/07/20 1115 03/07/20 1415 03/08/20 0521 03/08/20 0904  BP: 110/72 133/71 (!) 141/85   Pulse: 72 83 91   Resp:  16    Temp:  98 F (36.7 C) 99.1 F (37.3 C)   TempSrc:  Oral Oral   SpO2:  99% 99% 95%  Weight:      Height:       Physical Exam General: looks a lot stronger than admission, drinking water without issue and can sit up and reach for cup with L hand ENT: thrush in cheeks is now gone Heart: RRR, no rub Lungs: clear ant Abdomen: soft, nontender, RLQ renal allograft nontender Extremities: no edema Dialysis Access: LUE +large aneurysmal AVF with minimal thrill and bruit but pulsatile GU: condom cath with clear yellow urine in bag  Additional Objective Labs: Basic Metabolic Panel: Recent Labs  Lab 03/06/20 0536 03/07/20 0352 03/08/20 0943  NA 132* 134* 134*  K 4.0 3.9 3.8  CL 105 108 109  CO2 16* 16* 15*  GLUCOSE 222* 240* 283*  BUN 67* 61* 59*  CREATININE 2.13* 1.99* 2.11*  CALCIUM 9.1 8.9 8.5*  PHOS  --   --  2.1*   Liver Function Tests: Recent Labs  Lab 03/04/20 1628 03/08/20 0943  AST 12*  --   ALT 13  --   ALKPHOS 61  --   BILITOT 1.1  --   PROT 6.6  --   ALBUMIN 2.4* 1.8*   Recent Labs  Lab 03/04/20 1738  LIPASE 44   CBC: Recent Labs  Lab 03/04/20 1628 03/04/20 1628 03/04/20 1748 03/05/20 0648 03/07/20 0352  WBC 16.4*  --   --  10.8* 10.6*  NEUTROABS 15.3*  --   --   --  9.4*  HGB 11.5*   < > 11.6* 10.1* 10.2*  HCT 36.0*   < > 34.0* 31.4* 32.8*  MCV 80.9  --   --  79.3* 79.6*  PLT 174  --   --  113* 123*   < > = values in this interval not displayed.   Blood Culture    Component Value Date/Time   SDES URINE, RANDOM 01/11/2020 1051   SPECREQUEST  01/11/2020 1051     NONE Performed at [facility] [address], [place], [place] [address]    CULT >=100,000 COLONIES/mL PSEUDOMONAS AERUGINOSA (A) 01/11/2020 1051   REPTSTATUS 01/15/2020 FINAL 01/11/2020 1051    Cardiac Enzymes: No results for input(s): CKTOTAL, CKMB, CKMBINDEX, TROPONINI in the last 168 hours. CBG: Recent Labs  Lab 03/07/20 0805 03/07/20 1202 03/07/20 1741 03/07/20 2037 03/08/20 0731  GLUCAP 216* 198* 266* 357* 257*   Iron Studies: No results for input(s): IRON, TIBC, TRANSFERRIN, FERRITIN in the last 72 hours. @lablastinr3 @ Studies/Results: No results found. Medications: . sodium chloride 250 mL (03/05/20 1710)   . apixaban  5 mg Oral BID  . atorvastatin  80 mg Oral q1800  . chlorhexidine  15 mL Mouth Rinse BID  . finasteride  5 mg Oral Daily  . fluconazole  100 mg Oral Daily  . insulin aspart  0-15 Units Subcutaneous TID WC  . insulin aspart  0-5 Units Subcutaneous QHS  . insulin glargine  20 Units Subcutaneous Daily  . mouth  rinse  15 mL Mouth Rinse q12n4p  . metoprolol tartrate  12.5 mg Oral BID  . mirtazapine  7.5 mg Oral QHS  . mycophenolate  360 mg Oral BID  . predniSONE  10 mg Oral Q breakfast  . sodium bicarbonate  1,300 mg Oral TID  . sodium chloride flush  3 mL Intravenous Q12H  . tacrolimus  1.5 mg Oral BID  . 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  Recent baseline renal function 1-1.5 during 12/2019 admission, now with AKI cr 2.9 which is most likely hypovolemia in setting of V/D/poor po intake.  FeNa initially low.  Renal function improved initially with IV fluids now Cr stuck around 2-2.1.  Will check bladder scan today to make sure not retaining.  Looks euvolemic.  Possibly had some mild ATN on admission with hypovolemia + Tac which will take longer to improve.   Check another UA today as well (see below for hematuria).    **Transplant immunuosuppression:  Cont maintenance immunosuppression tac, myfortic, pred.  Tac troughs in 12/2019 in the 9s; in light of fluconazole interaction have empirically decreased tac by 25% for now from 2 BID to 1.5BID.   Tac trough pending from hospital day 1 (pre fluconazole) and checked another this morning given fluconazole interaction.  He will need levels followed closely outpt by his primary nephrologist Dr. Dimas Aguas.   **Metabolic acidosis:  Baseline mild metabolic acidosis with serum bicarb <20 during 12/2019 admission,  bicarb 11 this admission.  Multifactorial - AKI, CNI, diarrhea.  VBG ok on admit.  Started po bicarb 1300 TID and serum bicarb improving.   **dysphagia:  Being treated with fluconazole for thrush, c/f candida esophagitis.  Speech following.  Drinking fluids while im in room and seems a lot better  **Diarrhea, abd pain:  Per primary.  **HTN: BP fine on meds.   **Hematuria: wasn't present in 12/2019, has outpt urologist who can follow up.  Urine in bag wasn't and still isn't visibly bloody.  Will recheck UA.  **malnutrition: albumin < 2, on supplements, hospitalist added remeron today which I think is a good idea.  Remain hospitalized today.   Jannifer Hick MD 03/08/2020, 11:25 AM  [facility] Pager: [phone]

## 2020-03-08 NOTE — Progress Notes (Signed)
Pharmacy Antibiotic Note  Ryan Rice is a 69 y.o. male with hx kidney transplant presented to the ED on 03/04/2020 with poor oral intake and hypotension. She was started on fluconazole on 6/10 for candida esophagitis.  Today, 03/08/2020: -day #4 fluconazole - Tmax 99.1, wbc down 10.6 on 6/12 - scr 2.11 (crcl~32)   Plan: - continue fluconazole 100 mg daily - monitor renal function  ______________________________________  Height: 5\' 11"  (180.3 cm) Weight: 69.4 kg (152 lb 14.4 oz) IBW/kg (Calculated) : 75.3  Temp (24hrs), Avg:98.6 F (37 C), Min:98 F (36.7 C), Max:99.1 F (37.3 C)  Recent Labs  Lab 03/04/20 1628 03/04/20 1821 03/05/20 0648 03/06/20 0536 03/07/20 0352  WBC 16.4*  --  10.8*  --  10.6*  CREATININE 2.92*  --  2.33* 2.13* 1.99*  LATICACIDVEN 1.9 1.4  --   --   --     Estimated Creatinine Clearance: 34.4 mL/min (A) (by C-G formula based on SCr of 1.99 mg/dL (H)).    Allergies  Allergen Reactions  . Iodinated Diagnostic Agents Hives  . Tape Hives    Antimicrobials this admission:  6/10 Fluconazole>>  Microbiology results:  No cx  Thank you for allowing pharmacy to be a part of this patient's care.  Lynelle Doctor 03/08/2020 10:15 AM

## 2020-03-08 NOTE — TOC Transition Note (Signed)
Transition of Care [facility]) - CM/SW Discharge Note   Patient Details  Name: Ryan Rice MRN: [id number] Date of Birth: 1951/04/02  [facility] CM/SW Contact:  Greg Cutter, LCSW Phone Number: 03/08/2020, 11:13 AM   Clinical Narrative:   LCSW spoke to patient's RN. Homeless patient is set to discharge today and he is unable to afford or set up stable transportation per RN. LCSW provided bus voucher directly to RN to give to patient.  Eula Fried, BSW, MSW, LCSW [email or web address] Phone: [phone]

## 2020-03-08 NOTE — Progress Notes (Signed)
PROGRESS NOTE    Ryan Rice  IWP:[id number] DOB: [birth year] DOA: 03/04/2020 PCP: Mina Marble, PA-C    Brief Narrative:Patient is a 69 year old male with history of ESRD status post left renal transplant , history of CVA with residual expressive aphasia, proximal A. fib on Eliquis, diabetes mellitustype 2,hypertension, hyperlipidemia, peripheral vascular disease, BPH who presents for the evaluation of low blood pressure, decreased oral intake, epigastric pain. He reported poor oral intake for a week. He also reported decreased urinary output for couple of days ago. Also complained of diarrhea. On presentation he was hemodynamically stable. He was found to have AKI with creatinine of 2.9. His baseline creatinine is normal. Started on IV fluids. Nephrology also consulted.Physical therapy evaluated him and recommended skilled nursing facility on discharge.  Assessment & Plan:   Principal Problem:   ARF (acute renal failure) (HCC) Active Problems:   Kidney transplant recipient   Diabetes [facility])   Atrial fibrillation, chronic (HCC)   Hypertension associated with diabetes ([place])   History of CVA (cerebrovascular accident)   Hyperlipidemia associated with type 2 diabetes mellitus ([place])   Pressure injury of skin  AKI on a renal transplant patient: Baseline creatinine normal. His creatinine was 1.01 on 01/24/2020. Presented with creatinine of 2.9. Most likely prerenal AKI associated with decreased oral intake. Started on IV fluids. Nephrology consulted.Function is slowly improving creatinine 1.9 today.  Continue IV fluids at 75 cc an hour.  Will DC fluids today hopefully that will increase his appetite that he is able to start eating better.  Discussed with his wife at length over the phone. I advised her to encourage him to eat and welcome to bring home food that he likes. She was concerned that if he was depressed and that is why he is not eating. Discussed with Dr. Johnney Ou  and patient's wife will start him on Remeron to help with appetite and depression at 7.5 mg nightly.   ESRD status post renal transplant: Follows with nephrology at [facility] On Prograf, Myfortic, prednisone which we will continue.  Odynophagia/dysphagia:Followed by speech. Recommended dysphagia 3 dietiet. High suspicion for candidal esophagitis due to his immunocompromised status.Marland Kitchen He was found to have oral candidiasis. He presented with decreased oral intake. We have startedon fluconazolewhich we will continue for 10 to 14 days course..  Persistent A. fib: Currently rate is controlled. On Eliquis 5 mg twice a day for anticoagulation.  Debility/deconditioning/generalized weakness/history of CVA:Has residual expressive aphasia and gait imbalance. He is confused at baseline after the stroke as per his wife .He was admitted in April 2021 and was found to have evolving subacute left ACA territory infarct with petechial hemorrhage. On Eliquis and Lipitor at home. PT/OT recommend skilled nursingFacility. He has home health and follows with PT,OT, Speech.Wife is interested on taking him home with home health  Hypertension: Blood pressure 141/85 prior to receiving his medications.  Continue Lopressor.  He is also on Flomax.   Hyperlipidemia: Continue Lipitor  BPH:On Flomax and Proscar.  Diabetes type 2: On insulin and oral medications at home. Continue current regimen. CBG (last 3)  Recent Labs    03/07/20 1741 03/07/20 2037 03/08/20 0731  GLUCAP 266* 357* 257*   Lantus dose was increased yesterday to 20 units nightly monitor and increase the dose as needed.  Diarrhea: Has slowed down. Continue imodiumas needed.Low suspicion for an infectious etiology  Decreased appetite-patient is not eating enough to be able to safely discharge him home.  Start Remeron and DC IV fluids encourage  the patient and the patient's wife to encourage him to eat.  Starting Remeron  7.5 nightly today.  Pressure Injury 03/05/20 Sacrum Unstageable - Full thickness tissue loss in which the base of the injury is covered by slough (yellow, tan, gray, green or brown) and/or eschar (tan, brown or black) in the wound bed. Slough filled ulcer (Active)  03/05/20 1121  Location: Sacrum  Location Orientation:   Staging: Unstageable - Full thickness tissue loss in which the base of the injury is covered by slough (yellow, tan, gray, green or brown) and/or eschar (tan, brown or black) in the wound bed.  Wound Description (Comments): Slough filled ulcer  Present on Admission: Yes    Estimated body mass index is 21.33 kg/m as calculated from the following:   Height as of this encounter: 5\' 11"  (1.803 m).   Weight as of this encounter: 69.4 kg.  DVT prophylaxis:Eliquis Code Status:Full Family Communication discussed with wife 03/08/2020. Status is: Inpatient  Remains inpatient appropriate because:IV treatments appropriate due to intensity of illness or inability to take PO, starting Remeron.   Dispo: The patient is from: Home Anticipated d/c is BO:FBPZ Anticipated d/c date is: 2 days Patient currently is not medically stable to d/c. Needs nephrology clearance before discharge.    Consultants:Nephrology  Procedures:None  Antimicrobials: Subjective: Patient resting in bed he is anxious to go home however he is not eating enough to safely discharge him home.  Staff reports he eats very minimal amount during his mealtimes.  Objective: Vitals:   03/07/20 1115 03/07/20 1415 03/08/20 0521 03/08/20 0904  BP: 110/72 133/71 (!) 141/85   Pulse: 72 83 91   Resp:  16    Temp:  98 F (36.7 C) 99.1 F (37.3 C)   TempSrc:  Oral Oral   SpO2:  99% 99% 95%  Weight:      Height:        Intake/Output Summary (Last 24 hours) at 03/08/2020 0925 Last data filed at 03/08/2020 0840 Gross per 24 hour  Intake 240 ml  Output 1000 ml    Net -760 ml   Filed Weights   03/04/20 1619 03/05/20 0500 03/06/20 0559  Weight: 75.5 kg 75.5 kg 69.4 kg    Examination:  General exam: Appears calm and comfortable  Respiratory system: Clear to auscultation. Respiratory effort normal. Cardiovascular system: S1 & S2 heard, RRR. No JVD, murmurs, rubs, gallops or clicks. No pedal edema. Gastrointestinal system: Abdomen is nondistended, soft and nontender. No organomegaly or masses felt. Normal bowel sounds heard. Central nervous system: Alert and oriented. No focal neurological deficits. Extremities: Symmetric 5 x 5 power. Skin: No rashes, lesions or ulcers Psychiatry: Judgement and insight appear normal. Mood & affect appropriate.     Data Reviewed: I have personally reviewed following labs and imaging studies  CBC: Recent Labs  Lab 03/04/20 1628 03/04/20 1748 03/05/20 0648 03/07/20 0352  WBC 16.4*  --  10.8* 10.6*  NEUTROABS 15.3*  --   --  9.4*  HGB 11.5* 11.6* 10.1* 10.2*  HCT 36.0* 34.0* 31.4* 32.8*  MCV 80.9  --  79.3* 79.6*  PLT 174  --  113* 025*   Basic Metabolic Panel: Recent Labs  Lab 03/04/20 1628 03/04/20 1748 03/05/20 0648 03/06/20 0536 03/07/20 0352  NA 126* 130* 130* 132* 134*  K 4.3 4.5 4.1 4.0 3.9  CL 102  --  106 105 108  CO2 13*  --  11* 16* 16*  GLUCOSE 528*  --  349* 222*  240*  BUN 73*  --  69* 67* 61*  CREATININE 2.92*  --  2.33* 2.13* 1.99*  CALCIUM 8.5*  --  8.9 9.1 8.9   GFR: Estimated Creatinine Clearance: 34.4 mL/min (A) (by C-G formula based on SCr of 1.99 mg/dL (H)). Liver Function Tests: Recent Labs  Lab 03/04/20 1628  AST 12*  ALT 13  ALKPHOS 61  BILITOT 1.1  PROT 6.6  ALBUMIN 2.4*   Recent Labs  Lab 03/04/20 1738  LIPASE 44   No results for input(s): AMMONIA in the last 168 hours. Coagulation Profile: No results for input(s): INR, PROTIME in the last 168 hours. Cardiac Enzymes: No results for input(s): CKTOTAL, CKMB, CKMBINDEX, TROPONINI in the last 168  hours. BNP (last 3 results) No results for input(s): PROBNP in the last 8760 hours. HbA1C: No results for input(s): HGBA1C in the last 72 hours. CBG: Recent Labs  Lab 03/07/20 0805 03/07/20 1202 03/07/20 1741 03/07/20 2037 03/08/20 0731  GLUCAP 216* 198* 266* 357* 257*   Lipid Profile: No results for input(s): CHOL, HDL, LDLCALC, TRIG, CHOLHDL, LDLDIRECT in the last 72 hours. Thyroid Function Tests: No results for input(s): TSH, T4TOTAL, FREET4, T3FREE, THYROIDAB in the last 72 hours. Anemia Panel: No results for input(s): VITAMINB12, FOLATE, FERRITIN, TIBC, IRON, RETICCTPCT in the last 72 hours. Sepsis Labs: Recent Labs  Lab 03/04/20 1628 03/04/20 1821  LATICACIDVEN 1.9 1.4    Recent Results (from the past 240 hour(s))  SARS Coronavirus 2 by RT PCR (hospital order, performed in [facility] hospital lab) Nasopharyngeal Nasopharyngeal Swab     Status: None   Collection Time: 03/04/20  5:39 PM   Specimen: Nasopharyngeal Swab  Result Value Ref Range Status   SARS Coronavirus 2 NEGATIVE NEGATIVE Final    Comment: (NOTE) SARS-CoV-2 target nucleic acids are NOT DETECTED. The SARS-CoV-2 RNA is generally detectable in upper and lower respiratory specimens during the acute phase of infection. The lowest concentration of SARS-CoV-2 viral copies this assay can detect is 250 copies / mL. A negative result does not preclude SARS-CoV-2 infection and should not be used as the sole basis for treatment or other patient management decisions.  A negative result may occur with improper specimen collection / handling, submission of specimen other than nasopharyngeal swab, presence of viral mutation(s) within the areas targeted by this assay, and inadequate number of viral copies (<250 copies / mL). A negative result must be combined with clinical observations, patient history, and epidemiological information. Fact Sheet for Patients:   StrictlyIdeas.no Fact Sheet  for Healthcare Providers: BankingDealers.co.za This test is not yet approved or cleared  by the [place] FDA and has been authorized for detection and/or diagnosis of SARS-CoV-2 by FDA under an Emergency Use Authorization (EUA).  This EUA will remain in effect (meaning this test can be used) for the duration of the COVID-19 declaration under Section 564(b)(1) of the Act, 21 U.S.C. section 360bbb-3(b)(1), unless the authorization is terminated or revoked sooner. Performed at [facility], [address], [place], [place] [address]          Radiology Studies: No results found.      Scheduled Meds: . apixaban  5 mg Oral BID  . atorvastatin  80 mg Oral q1800  . chlorhexidine  15 mL Mouth Rinse BID  . finasteride  5 mg Oral Daily  . fluconazole  100 mg Oral Daily  . insulin aspart  0-15 Units Subcutaneous TID WC  . insulin aspart  0-5  Units Subcutaneous QHS  . insulin glargine  20 Units Subcutaneous Daily  . mouth rinse  15 mL Mouth Rinse q12n4p  . metoprolol tartrate  12.5 mg Oral BID  . mirtazapine  7.5 mg Oral QHS  . mycophenolate  360 mg Oral BID  . predniSONE  10 mg Oral Q breakfast  . sodium bicarbonate  1,300 mg Oral TID  . sodium chloride flush  3 mL Intravenous Q12H  . tacrolimus  1.5 mg Oral BID  . tamsulosin  0.4 mg Oral QPC breakfast   Continuous Infusions: . sodium chloride 250 mL (03/05/20 1710)     LOS: 4 days     Georgette Shell, MD 03/08/2020, 9:25 AM

## 2020-03-09 ENCOUNTER — Ambulatory Visit: Payer: Medicare HMO | Admitting: Neurology

## 2020-03-09 DIAGNOSIS — E785 Hyperlipidemia, unspecified: Secondary | ICD-10-CM

## 2020-03-09 DIAGNOSIS — I482 Chronic atrial fibrillation, unspecified: Secondary | ICD-10-CM

## 2020-03-09 DIAGNOSIS — Z8673 Personal history of transient ischemic attack (TIA), and cerebral infarction without residual deficits: Secondary | ICD-10-CM

## 2020-03-09 DIAGNOSIS — E1169 Type 2 diabetes mellitus with other specified complication: Secondary | ICD-10-CM

## 2020-03-09 DIAGNOSIS — Z940 Kidney transplant status: Secondary | ICD-10-CM

## 2020-03-09 LAB — GLUCOSE, CAPILLARY
Glucose-Capillary: 204 mg/dL — ABNORMAL HIGH (ref 70–99)
Glucose-Capillary: 208 mg/dL — ABNORMAL HIGH (ref 70–99)
Glucose-Capillary: 250 mg/dL — ABNORMAL HIGH (ref 70–99)
Glucose-Capillary: 298 mg/dL — ABNORMAL HIGH (ref 70–99)
Glucose-Capillary: 306 mg/dL — ABNORMAL HIGH (ref 70–99)

## 2020-03-09 LAB — RENAL FUNCTION PANEL
Albumin: 1.8 g/dL — ABNORMAL LOW (ref 3.5–5.0)
Anion gap: 11 (ref 5–15)
BUN: 55 mg/dL — ABNORMAL HIGH (ref 8–23)
CO2: 15 mmol/L — ABNORMAL LOW (ref 22–32)
Calcium: 8.3 mg/dL — ABNORMAL LOW (ref 8.9–10.3)
Chloride: 104 mmol/L (ref 98–111)
Creatinine, Ser: 2.32 mg/dL — ABNORMAL HIGH (ref 0.61–1.24)
GFR calc Af Amer: 32 mL/min — ABNORMAL LOW (ref >60)
GFR calc non Af Amer: 28 mL/min — ABNORMAL LOW (ref >60)
Glucose, Bld: 247 mg/dL — ABNORMAL HIGH (ref 70–99)
Phosphorus: 2.3 mg/dL — ABNORMAL LOW (ref 2.5–4.6)
Potassium: 4.2 mmol/L (ref 3.5–5.1)
Sodium: 130 mmol/L — ABNORMAL LOW (ref 135–145)

## 2020-03-09 MED ORDER — TACROLIMUS 1 MG PO CAPS
1.0000 mg | ORAL_CAPSULE | Freq: Two times a day (BID) | ORAL | Status: DC
  Administered 2020-03-10 – 2020-03-18 (×16): 1 mg via ORAL
  Filled 2020-03-09 (×16): qty 1

## 2020-03-09 NOTE — Progress Notes (Addendum)
Physical Therapy Treatment Patient Details Name: Ryan Rice MRN: [id number] DOB: 1951-08-12 Today's Date: 03/09/2020    History of Present Illness 69 y.o. male with medical history significant for ESRD s/p renal transplant on immunosuppressants,  hospitalized 12/29/19-01/10/20 for CVA with residual expressive aphasia, atrial fibrillation on Eliquis, type 2 diabetes, hypertension, hyperlipidemia, PVD, and BPH who is admitted with acute renal failure.    PT Comments    Pt participated fairly well. He requires increased time for processing and repeated cues. Continues to require +2 assist for safety for any activity away from the bed. High fall risk. No family present during session. Pt unable to report on his functional mobility level PTA. Will continue to follow. PT recommendation is for SNF but per chart plan is for pt to return home with wife.     Follow Up Recommendations  SNF (HHPT and 24 hour S/A if declines placement)     Equipment Recommendations       Recommendations for Other Services       Precautions / Restrictions Precautions Precautions: Fall Precaution Comments: aphasia Restrictions Weight Bearing Restrictions: No    Mobility  Bed Mobility Overal bed mobility: Needs Assistance Bed Mobility: Supine to Sit;Sit to Supine     Supine to sit: Max assist Sit to supine: Mod assist   General bed mobility comments: Assist for trunk and bil LEs. Utilized bedpad. Multimodal cueing required. Increased time.  Transfers Overall transfer level: Needs assistance Equipment used: Rolling walker (2 wheeled) Transfers: Sit to/from Stand;Lateral/Scoot Transfers Sit to Stand: Mod assist;+2 safety/equipment        Lateral/Scoot Transfers: Mod assist General transfer comment: x2. Stood for ~10 seconds each time. High fall risk with LE buckling occuring each time. Did not attempt any side steps or pivoting with +1 assist for safety reasons 2* high fall risk. Lateral scoots  toward the [facility] with use of bedpad  Ambulation/Gait                 Stairs             Wheelchair Mobility    Modified Rankin (Stroke Patients Only)       Balance Overall balance assessment: Needs assistance Sitting-balance support: Bilateral upper extremity supported Sitting balance-Leahy Scale: Fair                                      Cognition Arousal/Alertness: Awake/alert Behavior During Therapy: Flat affect Overall Cognitive Status: Difficult to assess                                        Exercises      General Comments        Pertinent Vitals/Pain Pain Assessment: No/denies pain    Home Living                      Prior Function            PT Goals (current goals can now be found in the care plan section) Progress towards PT goals: Progressing toward goals    Frequency    Min 3X/week      PT Plan Current plan remains appropriate    Co-evaluation              AM-PAC PT "6 Clicks" Mobility  Outcome Measure  Help needed turning from your back to your side while in a flat bed without using bedrails?: A Lot Help needed moving from lying on your back to sitting on the side of a flat bed without using bedrails?: A Lot Help needed moving to and from a bed to a chair (including a wheelchair)?: A Lot Help needed standing up from a chair using your arms (e.g., wheelchair or bedside chair)?: A Lot Help needed to walk in hospital room?: Total Help needed climbing 3-5 steps with a railing? : Total 6 Click Score: 10    End of Session Equipment Utilized During Treatment: Gait belt Activity Tolerance: Patient tolerated treatment well Patient left: in bed;with call bell/phone within reach;with bed alarm set   PT Visit Diagnosis: Muscle weakness (generalized) (M62.81);Difficulty in walking, not elsewhere classified (R26.2)     Time: 2811-8867 PT Time Calculation (min) (ACUTE ONLY): 20  min  Charges:  $Therapeutic Activity: 8-22 mins                        Doreatha Massed, PT Acute Rehabilitation  Office: [phone] Pager: [phone]

## 2020-03-09 NOTE — Progress Notes (Addendum)
PROGRESS NOTE    Ryan Rice  GQQ:[id number] DOB: [birth year] DOA: 03/04/2020 PCP: Mina Marble, PA-C    Brief Narrative:Patient is a 69 year old male with history of ESRD status post left renal transplant, history of CVA with residual expressive aphasia, proximal A. fib on Eliquis, diabetes mellitustype 2,hypertension, hyperlipidemia, peripheral vascular disease, BPH who presents for the evaluation of low blood pressure, decreased oral intake, epigastric pain. He reported poor oral intake for a week. He also reported decreased urinary output couple of days ago. Also complained of diarrhea. On presentation he was hemodynamically stable. He was found to have AKI with creatinine of 2.9. His baseline creatinine is normal. Started on IV fluids. Nephrology also consulted.Physical therapy evaluated him and recommended skilled nursing facility on discharge.  Assessment & Plan:   Principal Problem:   ARF (acute renal failure) (HCC) Active Problems:   Kidney transplant recipient   Diabetes [facility])   Atrial fibrillation, chronic (HCC)   Hypertension associated with diabetes ([place])   History of CVA (cerebrovascular accident)   Hyperlipidemia associated with type 2 diabetes mellitus ([place])   Pressure injury of skin  AKI on a renal transplant patient: Baseline creatinine normal. His creatinine was 1.01 on 01/24/2020. Presented with creatinine of 2.9. Most likely prerenal AKI associatedwith decreased oral intake. Started on IV fluids. Nephrology consulted. Creatinine today increased to 2.32.  He was on IV fluids which have been stopped per nephrology.  He has poor p.o. intake.  There was some concern of depression leading to poor oral intake. Started on Remeron.   ESRD status post renal transplant: Follows with nephrology at [facility]. On Prograf, Myfortic, prednisone which we will continue.  Odynophagia/dysphagia:Followed by speech. Recommended dysphagia 3 dietiet. High  suspicion for candidal esophagitis due to his immunocompromised status.He was found to have oral candidiasis. He presented with decreased oral intake. Started on fluconazole.  Continue for 10 to 14 days course..  Persistent A. fib: Currently rate is controlled. On Eliquis 5 mg twice a day for anticoagulation.  Debility/deconditioning/generalized weakness/history of CVA:Has residual expressive aphasia and gait imbalance. He is confused at baseline after the stroke as per his wife .He was admitted in April 2021 and was found to have evolving subacute left ACA territory infarct with petechial hemorrhage. On Eliquis and Lipitor at home. PT/OT recommend skilled nursingfacility. He has home health and follows with PT,OT, Speech.Wife is interested on taking him home with home?  Hypertension: Fluctuating blood pressure.  Continue current medications.  Continue to monitor blood pressure and adjust medications as needed.  Hyperlipidemia: Continue Lipitor  BPH:On Flomax and Proscar.  Diabetes type 2: Blood glucose running on the higher side.  On insulin and oral medications at home.  He is having decreased p.o. intake and at risk for hypoglycemia.  Continue to monitor Accu-Cheks and adjust insulin as needed. CBG (last 3)  Recent Labs    03/08/20 2220 03/09/20 0821 03/09/20 1119  GLUCAP 260* 204* 208*   Diarrhea: Has slowed down. Continue imodiumas needed.Low suspicion for an infectious etiology  Decreased appetite-patient is not eating enough to be able to safely discharge him home.  Started Remeron. IV fluids discontinued.  Patient encouraged to eat.  Pressure Injury 03/05/20 Sacrum Unstageable - Full thickness tissue loss in which the base of the injury is covered by slough (yellow, tan, gray, green or brown) and/or eschar (tan, brown or black) in the wound bed. Slough filled ulcer (Active)  03/05/20 1121  Location: Sacrum  Location Orientation:   Staging: Unstageable -  Full  thickness tissue loss in which the base of the injury is covered by slough (yellow, tan, gray, green or brown) and/or eschar (tan, brown or black) in the wound bed.  Wound Description (Comments): Slough filled ulcer  Present on Admission: Yes    Estimated body mass index is 21.33 kg/m as calculated from the following:   Height as of this encounter: 5\' 11"  (1.803 m).   Weight as of this encounter: 69.4 kg.  DVT prophylaxis:Eliquis Code Status:Full Family Communication discussed with wife 03/08/2020. Status is: Inpatient  Remains inpatient appropriate because:IV treatments appropriate due to intensity of illness or inability to take PO, starting Remeron.   Dispo: The patient is from: Home Anticipated d/c is YI:FOYD? Anticipated d/c date is: 1 to 2 days provided creatinine improves. Patient currently is not medically stable to d/c. Needs nephrology clearance before discharge.    Consultants:Nephrology  Procedures:None  Antimicrobials: Subjective: He is having decreased p.o. intake.  Creatinine increased today.  Blood glucose still remains high.  Blood pressure fluctuating.  He denies having any major complaints at this time.  Objective: Vitals:   03/08/20 0904 03/08/20 1419 03/08/20 2024 03/09/20 0351  BP:  113/67 115/71 (!) 155/87  Pulse:  76 79 93  Resp:  12 20 18   Temp:  97.8 F (36.6 C) 97.7 F (36.5 C) 99.8 F (37.7 C)  TempSrc:  Oral Oral Oral  SpO2: 95% 98% 100% 100%  Weight:      Height:        Intake/Output Summary (Last 24 hours) at 03/09/2020 1141 Last data filed at 03/09/2020 0500 Gross per 24 hour  Intake 480 ml  Output 200 ml  Net 280 ml   Filed Weights   03/04/20 1619 03/05/20 0500 03/06/20 0559  Weight: 75.5 kg 75.5 kg 69.4 kg    Examination:  General exam: Appears calm and comfortable  Respiratory system: Clear to auscultation. Respiratory effort normal. Cardiovascular system: S1 & S2  heard, RRR. No JVD, murmurs, rubs, gallops or clicks. No pedal edema. Gastrointestinal system: Abdomen is nondistended, soft and nontender. No organomegaly or masses felt. Normal bowel sounds heard. Central nervous system: Alert and oriented. No focal neurological deficits. Extremities: Symmetric 5 x 5 power. Skin: No rashes, lesions or ulcers Psychiatry: Judgement and insight appear normal. Mood & affect appropriate.     Data Reviewed: I have personally reviewed following labs and imaging studies  CBC: Recent Labs  Lab 03/04/20 1628 03/04/20 1748 03/05/20 0648 03/07/20 0352  WBC 16.4*  --  10.8* 10.6*  NEUTROABS 15.3*  --   --  9.4*  HGB 11.5* 11.6* 10.1* 10.2*  HCT 36.0* 34.0* 31.4* 32.8*  MCV 80.9  --  79.3* 79.6*  PLT 174  --  113* 741*   Basic Metabolic Panel: Recent Labs  Lab 03/05/20 0648 03/06/20 0536 03/07/20 0352 03/08/20 0943 03/09/20 0512  NA 130* 132* 134* 134* 130*  K 4.1 4.0 3.9 3.8 4.2  CL 106 105 108 109 104  CO2 11* 16* 16* 15* 15*  GLUCOSE 349* 222* 240* 283* 247*  BUN 69* 67* 61* 59* 55*  CREATININE 2.33* 2.13* 1.99* 2.11* 2.32*  CALCIUM 8.9 9.1 8.9 8.5* 8.3*  PHOS  --   --   --  2.1* 2.3*   GFR: Estimated Creatinine Clearance: 29.5 mL/min (A) (by C-G formula based on SCr of 2.32 mg/dL (H)). Liver Function Tests: Recent Labs  Lab 03/04/20 1628 03/08/20 0943 03/09/20 0512  AST 12*  --   --  ALT 13  --   --   ALKPHOS 61  --   --   BILITOT 1.1  --   --   PROT 6.6  --   --   ALBUMIN 2.4* 1.8* 1.8*   Recent Labs  Lab 03/04/20 1738  LIPASE 44   No results for input(s): AMMONIA in the last 168 hours. Coagulation Profile: No results for input(s): INR, PROTIME in the last 168 hours. Cardiac Enzymes: No results for input(s): CKTOTAL, CKMB, CKMBINDEX, TROPONINI in the last 168 hours. BNP (last 3 results) No results for input(s): PROBNP in the last 8760 hours. HbA1C: No results for input(s): HGBA1C in the last 72 hours. CBG: Recent  Labs  Lab 03/08/20 1250 03/08/20 1712 03/08/20 2220 03/09/20 0821 03/09/20 1119  GLUCAP 290* 268* 260* 204* 208*   Lipid Profile: No results for input(s): CHOL, HDL, LDLCALC, TRIG, CHOLHDL, LDLDIRECT in the last 72 hours. Thyroid Function Tests: No results for input(s): TSH, T4TOTAL, FREET4, T3FREE, THYROIDAB in the last 72 hours. Anemia Panel: No results for input(s): VITAMINB12, FOLATE, FERRITIN, TIBC, IRON, RETICCTPCT in the last 72 hours. Sepsis Labs: Recent Labs  Lab 03/04/20 1628 03/04/20 1821  LATICACIDVEN 1.9 1.4    Recent Results (from the past 240 hour(s))  SARS Coronavirus 2 by RT PCR (hospital order, performed in [facility] hospital lab) Nasopharyngeal Nasopharyngeal Swab     Status: None   Collection Time: 03/04/20  5:39 PM   Specimen: Nasopharyngeal Swab  Result Value Ref Range Status   SARS Coronavirus 2 NEGATIVE NEGATIVE Final    Comment: (NOTE) SARS-CoV-2 target nucleic acids are NOT DETECTED. The SARS-CoV-2 RNA is generally detectable in upper and lower respiratory specimens during the acute phase of infection. The lowest concentration of SARS-CoV-2 viral copies this assay can detect is 250 copies / mL. A negative result does not preclude SARS-CoV-2 infection and should not be used as the sole basis for treatment or other patient management decisions.  A negative result may occur with improper specimen collection / handling, submission of specimen other than nasopharyngeal swab, presence of viral mutation(s) within the areas targeted by this assay, and inadequate number of viral copies (<250 copies / mL). A negative result must be combined with clinical observations, patient history, and epidemiological information. Fact Sheet for Patients:   StrictlyIdeas.no Fact Sheet for Healthcare Providers: BankingDealers.co.za This test is not yet approved or cleared  by the [place] FDA and has been authorized  for detection and/or diagnosis of SARS-CoV-2 by FDA under an Emergency Use Authorization (EUA).  This EUA will remain in effect (meaning this test can be used) for the duration of the COVID-19 declaration under Section 564(b)(1) of the Act, 21 U.S.C. section 360bbb-3(b)(1), unless the authorization is terminated or revoked sooner. Performed at [facility], [address] [place], [place] [address]          Radiology Studies: No results found.      Scheduled Meds: . apixaban  5 mg Oral BID  . atorvastatin  80 mg Oral q1800  . chlorhexidine  15 mL Mouth Rinse BID  . finasteride  5 mg Oral Daily  . fluconazole  100 mg Oral Daily  . insulin aspart  0-15 Units Subcutaneous TID WC  . insulin aspart  0-5 Units Subcutaneous QHS  . insulin glargine  20 Units Subcutaneous Daily  . mouth rinse  15 mL Mouth Rinse q12n4p  . metoprolol tartrate  12.5 mg Oral BID  . mirtazapine  7.5 mg Oral QHS  . mycophenolate  360 mg Oral BID  . predniSONE  10 mg Oral Q breakfast  . sodium bicarbonate  1,300 mg Oral TID  . sodium chloride flush  3 mL Intravenous Q12H  . tacrolimus  1.5 mg Oral BID  . tamsulosin  0.4 mg Oral QPC breakfast   Continuous Infusions: . sodium chloride 250 mL (03/05/20 1710)     LOS: 5 days     Yaakov Guthrie, MD Pager on amion 03/09/2020, 11:41 AM

## 2020-03-09 NOTE — Progress Notes (Signed)
  Speech Language Pathology Treatment: Dysphagia  Patient Details Name: Ryan Rice MRN: [id number] DOB: 12/03/1950 Today's Date: 03/09/2020 Time: 3151-7616 SLP Time Calculation (min) (ACUTE ONLY): 15 min  Assessment / Plan / Recommendation Clinical Impression  Pt declined to consume any solids including foods on tray or pears in cup; He did accept tea and Ensure with icecream via straw.  Swallow appeared timely with clear voice throughout. No indication of dysphagia or aspiration.  Minimal delay in final swallow of liquid noted.    SLP reviewed with pt results of prior clinical swallow evaluation and concern for hard foods causing episodic aspiration. SLP inquired if he could tolerate a "hamburger" to which he stated "I don't know about that"= pt in agreement to continue dys3/thin diet in hospital.  Pt denies choking with intake since SLP evaluation.    Agree with recommendation for wife to be able to bring foods for pt to eat (softer). Recommend continue dys3 for institutionalized feeding with family bringing any food pt can tolerate.  Pt consumed Ensure with icecream readily and admitted he enjoyed it - RN approved intake.  SLP will sign off, All education completed to mitigate dysphagia/aspiration.  Encouraged pt to consume intake.    HPI HPI: 69 y.o. male with medical history significant for ESRD s/p renal transplant on immunosuppressants,  hospitalized 12/29/19-01/10/20 for CVA with residual expressive aphasia, atrial fibrillation on Eliquis, type 2 diabetes, hypertension, hyperlipidemia, PVD, and BPH who is admitted with acute renal failure..  Pt was treated on CIR for rehab after his CVA.  Pt strokes include left temporoparietal and occipital and anterior left frontal.  Swallow and speech eval ordered.  Pt admits to some coughing with intake prior to amission.  CXR negative for acute dx. SLP follow up regarding swallowing function after his clinical swallow evaluation last Thursday. Intake  has remained poor - and pt reports it has been poor since stroke in April.  He essentially forces himself to eat for nutrition but states nothing tastes good. SLP follow up to assess po tolerance, determine readiness for dietary advancement and educate him to precautions.      SLP Plan  Continue with current plan of care       Recommendations  Diet recommendations: Dysphagia 3 (mechanical soft);Thin liquid Liquids provided via: Cup;Straw Medication Administration: Other (Comment) (as tolerated) Supervision: Patient able to self feed Compensations: Slow rate;Small sips/bites Postural Changes and/or Swallow Maneuvers: Seated upright 90 degrees;Upright 30-60 min after meal                Oral Care Recommendations: Oral care BID Follow up Recommendations: Home health SLP SLP Visit Diagnosis: Dysphagia, oropharyngeal phase (R13.12) Plan: Continue with current plan of care       GO                Ryan Rice 03/09/2020, 2:37 PM  Ryan Lime, MS [place] Office [phone]

## 2020-03-09 NOTE — Care Management Important Message (Signed)
Important Message  Patient Details IM Letter given to Dessa Phi RN Case Manager to present to the Patient Name: Ryan Rice MRN: [id number] Date of Birth: [birth year]   Medicare Important Message Given:  Yes     Kerin Salen 03/09/2020, 12:06 PM

## 2020-03-09 NOTE — Progress Notes (Signed)
[facility] Progress Note  Subjective: seen in room no c/o today.  Tac level was 14.5 (drawn around 10 am prior to 11 am dose on 6/11).  Creat up slightly to 2.3 today.   Vitals:   03/08/20 1419 03/08/20 2024 03/09/20 0351 03/09/20 1352  BP: 113/67 115/71 (!) 155/87 109/68  Pulse: 76 79 93 81  Resp: 12 20 18 20   Temp: 97.8 F (36.6 C) 97.7 F (36.5 C) 99.8 F (37.7 C) 98.9 F (37.2 C)  TempSrc: Oral Oral Oral Oral  SpO2: 98% 100% 100% 97%  Weight:      Height:        Exam: General: looks a lot stronger than admission, drinking water without issue and can sit up and reach for cup with L hand ENT: thrush in cheeks is now gone Heart: RRR, no rub Lungs: clear ant Abdomen: soft, nontender, RLQ renal allograft nontender Extremities: no edema Dialysis Access: LUE +large aneurysmal AVF with minimal thrill and bruit but pulsatile GU: condom cath with clear yellow urine in bag      Assessment/ Plan: **ESRD s/p renal transplant now with AKI: 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 Recent baseline renal function 1-1.5 during 12/2019 admission, now with AKI cr 2.9 which is most likely hypovolemia in setting of V/D/poor po intake as well as probable effect from [place] levels. FeNa initially low.  Renal function improved initially with IV fluids now Cr stuck around 2-2.1. Up to 2.3 today after stopping IVF"s yest. Looks euvolemic and is drinking liquids.  Possibly had degree of ATN on admission with hypovolemia + Tac which will take longer to improve. Will hold prograf x 2 doses and resume at 1 mg bid (50% usual dose). Check another UA and bladder scan as well (see below for hematuria). Will follow.   **Transplant immunuosuppression: Cont myfortic, pred.  Tac troughs in April were in the 9s. Tac dose reduced 25% to 1.5 bid here on 6/11 for suspected toxicity.  Tac level from 6/11 is now back and is high at 14.5 > will plan to hold 2 doses and resume at 1 mg  bid (50% usual dose). Cont po fluids and daily creat. Will follow.    **Metabolic acidosis: Baseline mild metabolic acidosis with serum bicarb <20 during 12/2019 admission,  bicarb 11 this admission. Multifactorial - AKI, CNI, diarrhea. VBG ok on admit. Started po bicarb 1300 TID and serum bicarb improving.   **dysphagia: Being treated with fluconazole for thrush, c/f candida esophagitis. Speech following. Drinking fluids while im in room and seems a lot better  **Diarrhea, abd pain: Per primary.  **HTN: BP fine on meds.   **Hematuria: wasn't present in 12/2019, has outpt urologist who can follow up. Urine in bag wasn't and still isn't visibly bloody.  Will recheck UA.  **malnutrition: albumin < 2, on supplements, hospitalist added remeron    Rob Topher [place] 03/09/2020, 5:02 PM   Recent Labs  Lab 03/05/20 0648 03/06/20 0536 03/07/20 0352 03/07/20 0352 03/08/20 0943 03/09/20 0512  K 4.1   < > 3.9   < > 3.8 4.2  BUN 69*   < > 61*   < > 59* 55*  CREATININE 2.33*   < > 1.99*   < > 2.11* 2.32*  CALCIUM 8.9   < > 8.9   < > 8.5* 8.3*  PHOS  --   --   --   --  2.1* 2.3*  HGB 10.1*  --  10.2*  --   --   --    < > = values in this interval not displayed.   Inpatient medications: . apixaban  5 mg Oral BID  . atorvastatin  80 mg Oral q1800  . chlorhexidine  15 mL Mouth Rinse BID  . finasteride  5 mg Oral Daily  . fluconazole  100 mg Oral Daily  . insulin aspart  0-15 Units Subcutaneous TID WC  . insulin aspart  0-5 Units Subcutaneous QHS  . insulin glargine  20 Units Subcutaneous Daily  . mouth rinse  15 mL Mouth Rinse q12n4p  . metoprolol tartrate  12.5 mg Oral BID  . mirtazapine  7.5 mg Oral QHS  . mycophenolate  360 mg Oral BID  . predniSONE  10 mg Oral Q breakfast  . sodium bicarbonate  1,300 mg Oral TID  . sodium chloride flush  3 mL Intravenous Q12H  . [START ON 03/10/2020] tacrolimus  1 mg Oral BID  . tamsulosin  0.4 mg Oral QPC breakfast   . sodium chloride  250 mL (03/05/20 1710)   sodium chloride, acetaminophen **OR** acetaminophen, liver oil-zinc oxide, loperamide, ondansetron **OR** ondansetron (ZOFRAN) IV

## 2020-03-10 DIAGNOSIS — I10 Essential (primary) hypertension: Secondary | ICD-10-CM

## 2020-03-10 DIAGNOSIS — E1159 Type 2 diabetes mellitus with other circulatory complications: Secondary | ICD-10-CM

## 2020-03-10 DIAGNOSIS — E1121 Type 2 diabetes mellitus with diabetic nephropathy: Secondary | ICD-10-CM

## 2020-03-10 LAB — BASIC METABOLIC PANEL
Anion gap: 10 (ref 5–15)
BUN: 61 mg/dL — ABNORMAL HIGH (ref 8–23)
CO2: 18 mmol/L — ABNORMAL LOW (ref 22–32)
Calcium: 8.6 mg/dL — ABNORMAL LOW (ref 8.9–10.3)
Chloride: 103 mmol/L (ref 98–111)
Creatinine, Ser: 2.34 mg/dL — ABNORMAL HIGH (ref 0.61–1.24)
GFR calc Af Amer: 32 mL/min — ABNORMAL LOW (ref >60)
GFR calc non Af Amer: 27 mL/min — ABNORMAL LOW (ref >60)
Glucose, Bld: 264 mg/dL — ABNORMAL HIGH (ref 70–99)
Potassium: 4.2 mmol/L (ref 3.5–5.1)
Sodium: 131 mmol/L — ABNORMAL LOW (ref 135–145)

## 2020-03-10 LAB — CBC
HCT: 30.9 % — ABNORMAL LOW (ref 39.0–52.0)
Hemoglobin: 9.7 g/dL — ABNORMAL LOW (ref 13.0–17.0)
MCH: 25.1 pg — ABNORMAL LOW (ref 26.0–34.0)
MCHC: 31.4 g/dL (ref 30.0–36.0)
MCV: 80.1 fL (ref 80.0–100.0)
Platelets: 121 10*3/uL — ABNORMAL LOW (ref 150–400)
RBC: 3.86 MIL/uL — ABNORMAL LOW (ref 4.22–5.81)
RDW: 16.9 % — ABNORMAL HIGH (ref 11.5–15.5)
WBC: 9 10*3/uL (ref 4.0–10.5)
nRBC: 0 % (ref 0.0–0.2)

## 2020-03-10 LAB — URINALYSIS, ROUTINE W REFLEX MICROSCOPIC
Bilirubin Urine: NEGATIVE
Glucose, UA: 150 mg/dL — AB
Ketones, ur: NEGATIVE mg/dL
Nitrite: NEGATIVE
Protein, ur: >=300 mg/dL — AB
Specific Gravity, Urine: 1.012 (ref 1.005–1.030)
WBC, UA: >50 WBC/hpf — ABNORMAL HIGH (ref 0–5)
pH: 5 (ref 5.0–8.0)

## 2020-03-10 LAB — GLUCOSE, CAPILLARY
Glucose-Capillary: 252 mg/dL — ABNORMAL HIGH (ref 70–99)
Glucose-Capillary: 215 mg/dL — ABNORMAL HIGH (ref 70–99)
Glucose-Capillary: 247 mg/dL — ABNORMAL HIGH (ref 70–99)
Glucose-Capillary: 268 mg/dL — ABNORMAL HIGH (ref 70–99)

## 2020-03-10 LAB — TACROLIMUS LEVEL: Tacrolimus (FK506) - LabCorp: 15.3 ng/mL (ref 2.0–20.0)

## 2020-03-10 MED ORDER — INSULIN GLARGINE 100 UNIT/ML ~~LOC~~ SOLN
22.0000 [IU] | Freq: Every day | SUBCUTANEOUS | Status: DC
  Administered 2020-03-11 – 2020-03-12 (×2): 22 [IU] via SUBCUTANEOUS
  Filled 2020-03-10 (×3): qty 0.22

## 2020-03-10 MED ORDER — NYSTATIN 100000 UNIT/ML MT SUSP
5.0000 mL | Freq: Four times a day (QID) | OROMUCOSAL | Status: DC
  Administered 2020-03-10 – 2020-03-18 (×31): 500000 [IU] via ORAL
  Filled 2020-03-10 (×29): qty 5

## 2020-03-10 MED ORDER — INSULIN ASPART 100 UNIT/ML ~~LOC~~ SOLN
3.0000 [IU] | Freq: Three times a day (TID) | SUBCUTANEOUS | Status: DC
  Administered 2020-03-11 – 2020-03-13 (×6): 3 [IU] via SUBCUTANEOUS

## 2020-03-10 NOTE — Progress Notes (Addendum)
PROGRESS NOTE    Ryan Rice  NGE:[id number] DOB: August 21, 1951 DOA: 03/04/2020 PCP: Mina Marble, PA-C    Brief Narrative:Patient is a 69 year old male with history of ESRD status post left renal transplant, history of CVA with residual expressive aphasia, proximal A. fib on Eliquis, diabetes mellitustype 2,hypertension, hyperlipidemia, peripheral vascular disease, BPH who presents for the evaluation of low blood pressure, decreased oral intake, epigastric pain. He reported poor oral intake for a week. He also reported decreased urinary output couple of days ago. Also complained of diarrhea. On presentation he was hemodynamically stable. He was found to have AKI with creatinine of 2.9. His baseline creatinine is normal. Started on IV fluids, now stopped. Nephrology also consulted.Physical therapy evaluated him and recommended skilled nursing facility on discharge.  Assessment & Plan:   Principal Problem:   ARF (acute renal failure) (HCC) Active Problems:   Kidney transplant recipient   Diabetes [facility])   Atrial fibrillation, chronic (HCC)   Hypertension associated with diabetes ([place])   History of CVA (cerebrovascular accident)   Hyperlipidemia associated with type 2 diabetes mellitus ([place])   Pressure injury of skin  AKI on a renal transplant patient: Baseline creatinine normal. His creatinine was 1.01 on 01/24/2020. Presented with creatinine of 2.9. Most likely prerenal AKI associatedwith decreased oral intake. Started on IV fluids. Nephrology consulted. Creatinine 2.11<<2.32<<2.34 today.  He was on IV fluids which have been stopped per nephrology.  Per nursing staff he continues to have poor p.o. intake.  There was some concern of depression leading to poor oral intake. Started on Remeron.   ESRD status post renal transplant: Follows with nephrology at [facility]. On Prograf, Myfortic, prednisone.  Tacrolimus level high.  Per nephrology note from yesterday hold Prograf  x2 doses and resume at 1 mg twice daily.  Continue further medications per nephrology.  Odynophagia/dysphagia:Followed by speech. Recommended dysphagia 3 dietiet. High suspicion for candidal esophagitis due to his immunocompromised status.He was found to have oral candidiasis. He presented with decreased oral intake. Started on fluconazole.  Continue for 10 to 14 days course.  Persistent A. fib: Currently rate is controlled. On Eliquis 5 mg twice a day for anticoagulation.  Debility/deconditioning/generalized weakness/history of CVA:Has residual expressive aphasia and gait imbalance. He is confused at baseline after the stroke as per his wife .He was admitted in April 2021 and was found to have evolving subacute left ACA territory infarct with petechial hemorrhage. On Eliquis and Lipitor at home. PT/OT recommend skilled nursingfacility. He has home health and follows with PT,OT, Speech.Wife is interested on taking him home with home health?  Hypertension: Fluctuating blood pressure.  Continue current medications.  Continue to monitor blood pressure and adjust medications as needed.  Hyperlipidemia: Continue Lipitor  BPH:On Flomax and Proscar.  Diabetes type 2: Blood glucose running on the higher side.  On insulin and oral medications at home.  He is having decreased p.o. intake and at risk for hypoglycemia.  Consulted diabetes coordinator. Continue to monitor Accu-Cheks and adjust insulin as needed. CBG (last 3)  Recent Labs    03/09/20 2333 03/10/20 0743 03/10/20 1150  GLUCAP 298* 252* 215*   Diarrhea: Has slowed down. Continue imodiumas needed.Low suspicion for an infectious etiology  Decreased appetite-patient is not eating enough to be able to safely discharge him home.  Started Remeron. IV fluids discontinued.  Patient encouraged to eat.  Pressure Injury 03/05/20 Sacrum Unstageable - Full thickness tissue loss in which the base of the injury is covered by  slough (  yellow, tan, gray, green or brown) and/or eschar (tan, brown or black) in the wound bed. Slough filled ulcer (Active)  03/05/20 1121  Location: Sacrum  Location Orientation:   Staging: Unstageable - Full thickness tissue loss in which the base of the injury is covered by slough (yellow, tan, gray, green or brown) and/or eschar (tan, brown or black) in the wound bed.  Wound Description (Comments): Slough filled ulcer  Present on Admission: Yes    Estimated body mass index is 23.09 kg/m as calculated from the following:   Height as of this encounter: 5\' 11"  (1.803 m).   Weight as of this encounter: 75.1 kg.  DVT prophylaxis:Eliquis Code Status:Full Family Communication  talked to wife [name] on phone Status is: Inpatient  Remains inpatient appropriate because:IV treatments appropriate due to intensity of illness or inability to take PO, starting Remeron.   Dispo: The patient is from: Home Anticipated d/c is XI:HWTU?  Versus SNF Anticipated d/c date is: 1 to 2 days provided creatinine improves. Patient currently is not medically stable to d/c. Needs nephrology clearance before discharge.    Consultants:Nephrology  Procedures:None  Antimicrobials: Subjective: Creatinine increased today.  Blood glucose still remains high.  Blood pressure fluctuating.  Continues to have decreased oral intake per nursing staff.  He denies having any major complaints at this time.  Objective: Vitals:   03/09/20 2153 03/10/20 0500 03/10/20 0524 03/10/20 1243  BP: 124/83  (!) 167/85 113/69  Pulse: 83  100 88  Resp: 20  18 16   Temp: 98.9 F (37.2 C)  99.7 F (37.6 C) 98.3 F (36.8 C)  TempSrc: Oral  Oral Oral  SpO2: 100%  100% 97%  Weight:  75.1 kg    Height:        Intake/Output Summary (Last 24 hours) at 03/10/2020 1537 Last data filed at 03/10/2020 0500 Gross per 24 hour  Intake 300 ml  Output 475 ml  Net -175 ml   Filed  Weights   03/05/20 0500 03/06/20 0559 03/10/20 0500  Weight: 75.5 kg 69.4 kg 75.1 kg    Examination:  General exam: Appears calm and comfortable  Respiratory system: Clear to auscultation. Respiratory effort normal. Cardiovascular system: S1 & S2 heard, RRR. No JVD, murmurs, rubs, gallops or clicks. No pedal edema. Gastrointestinal system: Abdomen is nondistended, soft and nontender. No organomegaly or masses felt. Normal bowel sounds heard. Central nervous system: Alert and oriented. No focal neurological deficits. Extremities: Symmetric 5 x 5 power. Skin: No rashes, lesions or ulcers Psychiatry: Judgement and insight appear normal.      Data Reviewed: I have personally reviewed following labs and imaging studies  CBC: Recent Labs  Lab 03/04/20 1628 03/04/20 1748 03/05/20 0648 03/07/20 0352 03/10/20 0531  WBC 16.4*  --  10.8* 10.6* 9.0  NEUTROABS 15.3*  --   --  9.4*  --   HGB 11.5* 11.6* 10.1* 10.2* 9.7*  HCT 36.0* 34.0* 31.4* 32.8* 30.9*  MCV 80.9  --  79.3* 79.6* 80.1  PLT 174  --  113* 123* 882*   Basic Metabolic Panel: Recent Labs  Lab 03/06/20 0536 03/07/20 0352 03/08/20 0943 03/09/20 0512 03/10/20 0531  NA 132* 134* 134* 130* 131*  K 4.0 3.9 3.8 4.2 4.2  CL 105 108 109 104 103  CO2 16* 16* 15* 15* 18*  GLUCOSE 222* 240* 283* 247* 264*  BUN 67* 61* 59* 55* 61*  CREATININE 2.13* 1.99* 2.11* 2.32* 2.34*  CALCIUM 9.1 8.9 8.5* 8.3* 8.6*  PHOS  --   --  2.1* 2.3*  --    GFR: Estimated Creatinine Clearance: 31.6 mL/min (A) (by C-G formula based on SCr of 2.34 mg/dL (H)). Liver Function Tests: Recent Labs  Lab 03/04/20 1628 03/08/20 0943 03/09/20 0512  AST 12*  --   --   ALT 13  --   --   ALKPHOS 61  --   --   BILITOT 1.1  --   --   PROT 6.6  --   --   ALBUMIN 2.4* 1.8* 1.8*   Recent Labs  Lab 03/04/20 1738  LIPASE 44   No results for input(s): AMMONIA in the last 168 hours. Coagulation Profile: No results for input(s): INR, PROTIME in the  last 168 hours. Cardiac Enzymes: No results for input(s): CKTOTAL, CKMB, CKMBINDEX, TROPONINI in the last 168 hours. BNP (last 3 results) No results for input(s): PROBNP in the last 8760 hours. HbA1C: No results for input(s): HGBA1C in the last 72 hours. CBG: Recent Labs  Lab 03/09/20 1629 03/09/20 2149 03/09/20 2333 03/10/20 0743 03/10/20 1150  GLUCAP 250* 306* 298* 252* 215*   Lipid Profile: No results for input(s): CHOL, HDL, LDLCALC, TRIG, CHOLHDL, LDLDIRECT in the last 72 hours. Thyroid Function Tests: No results for input(s): TSH, T4TOTAL, FREET4, T3FREE, THYROIDAB in the last 72 hours. Anemia Panel: No results for input(s): VITAMINB12, FOLATE, FERRITIN, TIBC, IRON, RETICCTPCT in the last 72 hours. Sepsis Labs: Recent Labs  Lab 03/04/20 1628 03/04/20 1821  LATICACIDVEN 1.9 1.4    Recent Results (from the past 240 hour(s))  SARS Coronavirus 2 by RT PCR (hospital order, performed in [facility] hospital lab) Nasopharyngeal Nasopharyngeal Swab     Status: None   Collection Time: 03/04/20  5:39 PM   Specimen: Nasopharyngeal Swab  Result Value Ref Range Status   SARS Coronavirus 2 NEGATIVE NEGATIVE Final    Comment: (NOTE) SARS-CoV-2 target nucleic acids are NOT DETECTED. The SARS-CoV-2 RNA is generally detectable in upper and lower respiratory specimens during the acute phase of infection. The lowest concentration of SARS-CoV-2 viral copies this assay can detect is 250 copies / mL. A negative result does not preclude SARS-CoV-2 infection and should not be used as the sole basis for treatment or other patient management decisions.  A negative result may occur with improper specimen collection / handling, submission of specimen other than nasopharyngeal swab, presence of viral mutation(s) within the areas targeted by this assay, and inadequate number of viral copies (<250 copies / mL). A negative result must be combined with clinical observations, patient history, and  epidemiological information. Fact Sheet for Patients:   StrictlyIdeas.no Fact Sheet for Healthcare Providers: BankingDealers.co.za This test is not yet approved or cleared  by the [place] FDA and has been authorized for detection and/or diagnosis of SARS-CoV-2 by FDA under an Emergency Use Authorization (EUA).  This EUA will remain in effect (meaning this test can be used) for the duration of the COVID-19 declaration under Section 564(b)(1) of the Act, 21 U.S.C. section 360bbb-3(b)(1), unless the authorization is terminated or revoked sooner. Performed at [facility], [address], [place], [place] [address]          Radiology Studies: No results found.      Scheduled Meds: . apixaban  5 mg Oral BID  . atorvastatin  80 mg Oral q1800  . chlorhexidine  15 mL Mouth Rinse BID  . finasteride  5 mg Oral Daily  . fluconazole  100 mg Oral Daily  . insulin  aspart  0-15 Units Subcutaneous TID WC  . insulin aspart  0-5 Units Subcutaneous QHS  . insulin glargine  20 Units Subcutaneous Daily  . mouth rinse  15 mL Mouth Rinse q12n4p  . metoprolol tartrate  12.5 mg Oral BID  . mirtazapine  7.5 mg Oral QHS  . mycophenolate  360 mg Oral BID  . predniSONE  10 mg Oral Q breakfast  . sodium bicarbonate  1,300 mg Oral TID  . sodium chloride flush  3 mL Intravenous Q12H  . tacrolimus  1 mg Oral BID  . tamsulosin  0.4 mg Oral QPC breakfast   Continuous Infusions: . sodium chloride 250 mL (03/05/20 1710)     LOS: 6 days     Yaakov Guthrie, MD Pager on amion 03/10/2020, 3:37 PM

## 2020-03-10 NOTE — Progress Notes (Signed)
[facility] Progress Note  Subjective: UOP 500 cc yest, creat stable at 2.3 today.    Vitals:   03/09/20 2153 03/10/20 0500 03/10/20 0524 03/10/20 1243  BP: 124/83  (!) 167/85 113/69  Pulse: 83  100 88  Resp: 20  18 16   Temp: 98.9 F (37.2 C)  99.7 F (37.6 C) 98.3 F (36.8 C)  TempSrc: Oral  Oral Oral  SpO2: 100%  100% 97%  Weight:  75.1 kg    Height:        Exam: General: looks a lot stronger than admission, drinking water without issue and can sit up and reach for cup with L hand ENT: thrush in cheeks is now gone Heart: RRR, no rub Lungs: clear ant Abdomen: soft, nontender, RLQ renal allograft nontender Extremities: no edema Dialysis Access: LUE +large aneurysmal AVF with minimal thrill and bruit but pulsatile GU: condom cath with clear yellow urine in bag   Renal Tx [place] 6/10 > IMPRESSION: 1. Stable right transplant kidney.  No hydronephrosis. 2. Prostate enlargement    UA 6/15 - cloudy, yellow,++ LE/ neg nitrite,+ Hb, 11-20 rbc/ >50 wbc/++ bacteria    CT abd / pelvis 6/09 - ...a transplant kidney is seen within the right renal pelvis. This is normal in size without evidence of focal lesions, hydronephrosis or renal calculi. Bladder is unremarkable.    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 Recent baseline renal function 1-1.5 during 12/2019 admission, now with AKI cr 2.9 which is most likely hypovolemia in setting of V/D/poor po intake + also possibly effect of ^Tac levels at time of admission. FeNa initially low.  Renal function improved initially with IV fluids now Cr stuck around 2-2.1. Was up to 2.3 yest and stable at 2.3 today. Suspect Tac toxicity sig issue w/ AKI.  Tac troughs in April were in the 9s. Tac dose reduced 2mg  bid > to 1.5 bid here on 6/11 for suspected toxicity.  Tac level from 6/11 is now back and is high at 14.5 (range 3-7) > we are now holding 2 doses and  restarting tonight at 10pm at 1 mg bid (50% usual dose). Creat is stable today.  Cont po fluids and daily creat. Hopefully lower dosing will improve tac levels and renal perfusion/ renal function. Addition of diflucan also will ^ tac levels. Pharm consulted. Repeat labs in am.   **Transplant immunuosuppression: Cont myfortic, pred. See above for tacrolimus.    **Hematuria/pyuria:  has outpt urologist who can follow up. Urine in bag isn't visibly bloody.  Repeat UA showing lots of bacteria/ WBC"s and some RBC's. Urine culture is pending. Had pseudomonas UTI in April 2021. Low grade temps not symptomatic. Per primary.   **Metabolic acidosis: Baseline mild metabolic acidosis with serum bicarb <20 during 12/2019 admission,  bicarb 11 this admission. Multifactorial - AKI, CNI, diarrhea. VBG ok on admit. Started po bicarb 1300 TID and serum bicarb improving.   **Dysphagia: Being treated with fluconazole for thrush, c/f candida esophagitis. Speech following. Seems to be doing much better w/ eating per RN  **Diarrhea, abd pain: Per primary.  **HTN: BP fine on meds.   **Malnutrition: albumin < 2, on supplements, hospitalist added remeron    Rob Gar Glance 03/10/2020, 3:27 PM   Recent Labs  Lab 03/07/20 0352 03/07/20 0352 03/08/20 0943 03/08/20 0943 03/09/20 0512 03/10/20 0531  K 3.9   < > 3.8   < >  4.2 4.2  BUN 61*   < > 59*   < > 55* 61*  CREATININE 1.99*   < > 2.11*   < > 2.32* 2.34*  CALCIUM 8.9   < > 8.5*   < > 8.3* 8.6*  PHOS  --   --  2.1*  --  2.3*  --   HGB 10.2*  --   --   --   --  9.7*   < > = values in this interval not displayed.   Inpatient medications: . apixaban  5 mg Oral BID  . atorvastatin  80 mg Oral q1800  . chlorhexidine  15 mL Mouth Rinse BID  . finasteride  5 mg Oral Daily  . fluconazole  100 mg Oral Daily  . insulin aspart  0-15 Units Subcutaneous TID WC  . insulin aspart  0-5 Units Subcutaneous QHS  . insulin glargine  20 Units Subcutaneous Daily   . mouth rinse  15 mL Mouth Rinse q12n4p  . metoprolol tartrate  12.5 mg Oral BID  . mirtazapine  7.5 mg Oral QHS  . mycophenolate  360 mg Oral BID  . predniSONE  10 mg Oral Q breakfast  . sodium bicarbonate  1,300 mg Oral TID  . sodium chloride flush  3 mL Intravenous Q12H  . tacrolimus  1 mg Oral BID  . tamsulosin  0.4 mg Oral QPC breakfast   . sodium chloride 250 mL (03/05/20 1710)   sodium chloride, acetaminophen **OR** acetaminophen, liver oil-zinc oxide, loperamide, ondansetron **OR** ondansetron (ZOFRAN) IV

## 2020-03-10 NOTE — Progress Notes (Signed)
Inpatient Diabetes Program Recommendations  AACE/ADA: New Consensus Statement on Inpatient Glycemic Control (2015)  Target Ranges:  Prepandial:   less than 140 mg/dL      Peak postprandial:   less than 180 mg/dL (1-2 hours)      Critically ill patients:  140 - 180 mg/dL   Lab Results  Component Value Date   GLUCAP 252 (H) 03/10/2020   HGBA1C 10.9 (H) 03/05/2020    Review of Glycemic Control  Blood sugars above goal. Titrate insulin.  Inpatient Diabetes Program Recommendations:     Increase Lantus to 22 units QD Add Novolog 3 units tidwc for meal coverage insulin if pt eats > 50% meal  Continue to follow closely.  Thank you. Lorenda Peck, RD, LDN, CDE Inpatient Diabetes Coordinator [phone]

## 2020-03-10 NOTE — Progress Notes (Signed)
Patient had a 14 beat run of wide QRS complex. Patient is asymptomatic and vitals are stable. NP on call notified.

## 2020-03-10 NOTE — Progress Notes (Signed)
Occupational Therapy Treatment Patient Details Name: Ryan Rice MRN: [id number] DOB: 1950/10/29 Today's Date: 03/10/2020    History of present illness 69 y.o. male with medical history significant for ESRD s/p renal transplant on immunosuppressants,  hospitalized 12/29/19-01/10/20 for CVA with residual expressive aphasia, atrial fibrillation on Eliquis, type 2 diabetes, hypertension, hyperlipidemia, PVD, and BPH who is admitted with acute renal failure.   OT comments  Treatment focused on improving functional mobility and participation in self care tasks. Patient sat edge of bed unsupported and performed grooming task with set up and supervision. Patient min assist to stand from elevated bed and take steps to recliner with RW and verbal cues for sequencing. Patient continues to demonstrate slow processing but conversation appropriate with minimal verbal cues.   Follow Up Recommendations  SNF;Supervision/Assistance - 24 hour    Equipment Recommendations  Other (comment)    Recommendations for Other Services      Precautions / Restrictions Precautions Precautions: Fall       Mobility Bed Mobility   Bed Mobility: Supine to Sit     Supine to sit: Mod assist     General bed mobility comments: Min assist to advance lower extremities to side of bed and assistance with trunk negotiation  Transfers Overall transfer level: Needs assistance Equipment used: Rolling walker (2 wheeled)   Sit to Stand: Min assist (from elevated bed height. On first attempt patinet unable to come fully into standing reporting left foot pain. On second stand, increased bed height and verbal cue to bear most weight through RLE patient able to stand.) Stand pivot transfers: Min assist       General transfer comment: MIn assist with RW to take steps - needing verbal cues for sequencing - to transfer to recliner. verbal cues to extend elbows and stand tall.    Balance   Sitting-balance support: Bilateral  upper extremity supported Sitting balance-Leahy Scale: Fair     Standing balance support: Bilateral upper extremity supported;During functional activity Standing balance-Leahy Scale: Fair Standing balance comment: relies on BUE support                           ADL either performed or assessed with clinical judgement   ADL       Grooming: Set up;Sitting;Wash/dry face;Oral care;Cueing for sequencing (Patient sat side of bed with trunk unsupported to perform grooming task with set up.)                                       Vision       Perception     Praxis      Cognition Arousal/Alertness: Awake/alert Behavior During Therapy: Flat affect Overall Cognitive Status: No family/caregiver present to determine baseline cognitive functioning                                          Exercises     Shoulder Instructions       General Comments      Pertinent Vitals/ Pain       Pain Assessment: Faces Faces Pain Scale: Hurts a Benito bit Pain Location: left foot Pain Intervention(s): Monitored during session  Home Living  Prior Functioning/Environment              Frequency  Min 2X/week        Progress Toward Goals  OT Goals(current goals can now be found in the care plan section)  Progress towards OT goals: Progressing toward goals     Plan Discharge plan remains appropriate    Co-evaluation          OT goals addressed during session: ADL's and self-care (functional mobility)      AM-PAC OT "6 Clicks" Daily Activity     Outcome Measure     Help from another person taking care of personal grooming?: A [place] Help from another person toileting, which includes using toliet, bedpan, or urinal?: A Lot Help from another person bathing (including washing, rinsing, drying)?: A Lot Help from another person to put on and taking off regular upper body  clothing?: A [place] Help from another person to put on and taking off regular lower body clothing?: A Lot 6 Click Score: 12    End of Session Equipment Utilized During Treatment: Rolling walker;Gait belt  OT Visit Diagnosis: Unsteadiness on feet (R26.81);Other abnormalities of gait and mobility (R26.89);Muscle weakness (generalized) (M62.81);Other symptoms and signs involving cognitive function   Activity Tolerance     Patient Left in chair;with call bell/phone within reach;with chair alarm set   Nurse Communication Mobility status        Time: 4098-1191 OT Time Calculation (min): 21 min  Charges: OT General Charges $OT Visit: 1 Visit OT Treatments $Self Care/Home Management : 8-22 mins  Derl Barrow, OTR/L [place]  Office [phone] Pager: [place] 03/10/2020, 4:36 PM

## 2020-03-10 NOTE — Plan of Care (Signed)
  Problem: Fluid Volume: Goal: Fluid volume balance will be maintained or improved Outcome: Progressing   Problem: Respiratory: Goal: Respiratory symptoms related to disease process will be avoided Outcome: Progressing   Problem: Fluid Volume: Goal: Ability to maintain a balanced intake and output will improve Outcome: Progressing

## 2020-03-10 NOTE — TOC Progression Note (Addendum)
Transition of Care [facility]) - Progression Note    Patient Details  Name: Ryan Rice MRN: [id number] Date of Birth: 06/16/1951  [facility] CM/SW Contact  Ross Ludwig, [place] Phone Number: 03/10/2020, 10:32 AM  Clinical Narrative:     CSW contacted patient's insurance company, patient's insurance is managed by [employer].  If patient is agreeable to SNF, CSW will begin insurance authorization.  3:55pm  CSW spoke to patient's wife, and she would like patient to continue with home health through [facility].  CSW to continue to follow patient's progress throughout discharge planning.        Expected Discharge Plan and Services                                                 Social Determinants of Health (SDOH) Interventions    Readmission Risk Interventions Readmission Risk Prevention Plan 12/31/2019  Transportation Screening Complete  HRI or Home Care Consult Complete  Palliative Care Screening Not Applicable  Medication Review (RN Care Manager) Referral to Pharmacy

## 2020-03-11 LAB — GLUCOSE, CAPILLARY
Glucose-Capillary: 239 mg/dL — ABNORMAL HIGH (ref 70–99)
Glucose-Capillary: 218 mg/dL — ABNORMAL HIGH (ref 70–99)
Glucose-Capillary: 224 mg/dL — ABNORMAL HIGH (ref 70–99)
Glucose-Capillary: 236 mg/dL — ABNORMAL HIGH (ref 70–99)

## 2020-03-11 LAB — CBC
HCT: 30.8 % — ABNORMAL LOW (ref 39.0–52.0)
Hemoglobin: 10.1 g/dL — ABNORMAL LOW (ref 13.0–17.0)
MCH: 25.3 pg — ABNORMAL LOW (ref 26.0–34.0)
MCHC: 32.8 g/dL (ref 30.0–36.0)
MCV: 77.2 fL — ABNORMAL LOW (ref 80.0–100.0)
Platelets: 129 K/uL — ABNORMAL LOW (ref 150–400)
RBC: 3.99 MIL/uL — ABNORMAL LOW (ref 4.22–5.81)
RDW: 16.8 % — ABNORMAL HIGH (ref 11.5–15.5)
WBC: 9.9 K/uL (ref 4.0–10.5)
nRBC: 0 % (ref 0.0–0.2)

## 2020-03-11 LAB — URINE CULTURE: Culture: NO GROWTH

## 2020-03-11 LAB — BASIC METABOLIC PANEL WITH GFR
Anion gap: 10 (ref 5–15)
BUN: 56 mg/dL — ABNORMAL HIGH (ref 8–23)
CO2: 16 mmol/L — ABNORMAL LOW (ref 22–32)
Calcium: 8.3 mg/dL — ABNORMAL LOW (ref 8.9–10.3)
Chloride: 104 mmol/L (ref 98–111)
Creatinine, Ser: 1.9 mg/dL — ABNORMAL HIGH (ref 0.61–1.24)
GFR calc Af Amer: 41 mL/min — ABNORMAL LOW (ref >60)
GFR calc non Af Amer: 35 mL/min — ABNORMAL LOW (ref >60)
Glucose, Bld: 265 mg/dL — ABNORMAL HIGH (ref 70–99)
Potassium: 5.6 mmol/L — ABNORMAL HIGH (ref 3.5–5.1)
Sodium: 130 mmol/L — ABNORMAL LOW (ref 135–145)

## 2020-03-11 MED ORDER — MIRTAZAPINE 15 MG PO TABS
15.0000 mg | ORAL_TABLET | Freq: Every day | ORAL | Status: DC
  Administered 2020-03-11 – 2020-03-17 (×7): 15 mg via ORAL
  Filled 2020-03-11 (×7): qty 1

## 2020-03-11 MED ORDER — SODIUM ZIRCONIUM CYCLOSILICATE 10 G PO PACK
10.0000 g | PACK | Freq: Every day | ORAL | Status: DC
  Administered 2020-03-11 – 2020-03-12 (×2): 10 g via ORAL
  Filled 2020-03-11 (×2): qty 1

## 2020-03-11 MED ORDER — MEGESTROL ACETATE 400 MG/10ML PO SUSP
400.0000 mg | Freq: Two times a day (BID) | ORAL | Status: DC
  Administered 2020-03-11 – 2020-03-18 (×14): 400 mg via ORAL
  Filled 2020-03-11 (×15): qty 10

## 2020-03-11 NOTE — Plan of Care (Signed)
  Problem: Nutritional: Goal: Ability to make appropriate dietary choices will improve Outcome: Progressing   Problem: Respiratory: Goal: Respiratory symptoms related to disease process will be avoided Outcome: Progressing   

## 2020-03-11 NOTE — Progress Notes (Signed)
Physical Therapy Treatment Patient Details Name: Ryan Rice MRN: [id number] DOB: [birth year] Today's Date: 03/11/2020    History of Present Illness 69 y.o. male with medical history significant for ESRD s/p renal transplant on immunosuppressants,  hospitalized 12/29/19-01/10/20 for CVA with residual expressive aphasia, atrial fibrillation on Eliquis, type 2 diabetes, hypertension, hyperlipidemia, PVD, and BPH who is admitted with acute renal failure.    PT Comments    Encouragement for pt to participate. Performance seems to fluctuate. No family has been present during session. Per chart, wife stated she wished for pt to return home. Pt continues to require significant assistance for mobility. He is at risk for falls when mobilizing. Will continue to follow.   Follow Up Recommendations  SNF (HHPT and 24 hour S/A if declines placement)     Equipment Recommendations       Recommendations for Other Services       Precautions / Restrictions Precautions Precautions: Fall Precaution Comments: aphasia Restrictions Weight Bearing Restrictions: No    Mobility  Bed Mobility Overal bed mobility: Needs Assistance Bed Mobility: Supine to Sit;Sit to Supine     Supine to sit: Max assist;HOB elevated Sit to supine: Mod assist;HOB elevated   General bed mobility comments: Assist for trunk and bil LEs. Utilized bedpad to assist with positioning, scooting. Pt used bedrails to assist. Multimodal cueing and increased time required.  Transfers Overall transfer level: Needs assistance Equipment used: Rolling walker (2 wheeled) Transfers: Sit to/from Stand Sit to Stand: Mod assist         General transfer comment: Sit to stand x 3. Pt able to stand for ~10-15 seconds each time. Poor posture-flexed hip/trunk. Multimodal cueing required for proper use of RW, posture, feet placment. Wide BOS in static standing.  Ambulation/Gait             General Gait Details: NT-unable to safely  attempt   Stairs             Wheelchair Mobility    Modified Rankin (Stroke Patients Only)       Balance Overall balance assessment: Needs assistance         Standing balance support: Bilateral upper extremity supported Standing balance-Leahy Scale: Poor                              Cognition Arousal/Alertness: Awake/alert Behavior During Therapy: Flat affect Overall Cognitive Status: No family/caregiver present to determine baseline cognitive functioning                                        Exercises      General Comments        Pertinent Vitals/Pain Pain Assessment: Faces Faces Pain Scale: Hurts Rollo more Pain Location: LEs when touch, especially L foot Pain Descriptors / Indicators: Sore;Discomfort;Grimacing Pain Intervention(s): Limited activity within patient's tolerance;Monitored during session;Repositioned    Home Living                      Prior Function            PT Goals (current goals can now be found in the care plan section) Progress towards PT goals: Progressing toward goals    Frequency    Min 3X/week      PT Plan Current plan remains appropriate    Co-evaluation  AM-PAC PT "6 Clicks" Mobility   Outcome Measure  Help needed turning from your back to your side while in a flat bed without using bedrails?: A Lot Help needed moving from lying on your back to sitting on the side of a flat bed without using bedrails?: A Lot Help needed moving to and from a bed to a chair (including a wheelchair)?: Total Help needed standing up from a chair using your arms (e.g., wheelchair or bedside chair)?: A Lot Help needed to walk in hospital room?: Total Help needed climbing 3-5 steps with a railing? : Total 6 Click Score: 9    End of Session Equipment Utilized During Treatment: Gait belt Activity Tolerance: Patient limited by fatigue Patient left: in bed;with call bell/phone  within reach;with bed alarm set   PT Visit Diagnosis: Muscle weakness (generalized) (M62.81);Difficulty in walking, not elsewhere classified (R26.2)     Time: 0626-9485 PT Time Calculation (min) (ACUTE ONLY): 28 min  Charges:  $Therapeutic Activity: 23-37 mins                        Doreatha Massed, PT Acute Rehabilitation  Office: [phone] Pager: [phone]

## 2020-03-11 NOTE — Progress Notes (Signed)
PROGRESS NOTE    Arun Herrod  ZOX:[id number] DOB: 1951/08/17 DOA: 03/04/2020 PCP: Mina Marble, PA-C    Brief Narrative:Patient is a 69 year old male with history of ESRD status post left renal transplant, history of CVA with residual expressive aphasia, proximal A. fib on Eliquis, diabetes mellitustype 2,hypertension, hyperlipidemia, peripheral vascular disease, BPH who presents for the evaluation of low blood pressure, decreased oral intake, epigastric pain. He reported poor oral intake for a week. He also reported decreased urinary output couple of days ago. Also complained of diarrhea. On presentation he was hemodynamically stable. He was found to have AKI with creatinine of 2.9. His baseline creatinine is normal. Started on IV fluids, now stopped. Nephrology also consulted.Physical therapy evaluated him and recommended skilled nursing facility on discharge.  Assessment & Plan:   Principal Problem:   ARF (acute renal failure) (HCC) Active Problems:   Kidney transplant recipient   Diabetes [facility])   Atrial fibrillation, chronic (HCC)   Hypertension associated with diabetes ([place])   History of CVA (cerebrovascular accident)   Hyperlipidemia associated with type 2 diabetes mellitus ([place])   Pressure injury of skin  AKI on a renal transplant patient: Baseline creatinine normal. His creatinine was 1.01 on 01/24/2020. Presented with creatinine of 2.9. Most likely prerenal AKI associatedwith decreased oral intake. Started on IV fluids. Nephrology consulted. Creatinine continues to improve. He was on IV fluids which is currently resolved. Patient is currently still having poor p.o. intake with minimal to 0% oral intake. Patient remains at risk for recurrent UTI.  ESRD status post renal transplant: Follows with nephrology at [facility]. On Prograf, Myfortic, prednisone.  Tacrolimus level high.  Per nephrology note from yesterday hold Prograf x2 doses and resume at 1 mg  twice daily.  Continue further medications per nephrology. Medication adjusted by nephrology.  Odynophagia/dysphagia:Followed by speech. Recommended dysphagia 3 dietiet. High suspicion for candidal esophagitis due to his immunocompromised status.He was found to have oral candidiasis. He presented with decreased oral intake. Started on fluconazole.  Change to nystatin due to concern with Prograf. Continue for 10 to 14 days course.  Persistent A. fib: Currently rate is controlled. On Eliquis 5 mg twice a day for anticoagulation.  Debility/deconditioning/generalized weakness/history of CVA:Has residual expressive aphasia and gait imbalance. He is confused at baseline after the stroke as per his wife .He was admitted in April 2021 and was found to have evolving subacute left ACA territory infarct with petechial hemorrhage. On Eliquis and Lipitor at home. PT/OT recommend skilled nursingfacility. He has home health and follows with PT,OT, Speech.Wife is interested on taking him home with home health  Hypertension: Fluctuating blood pressure.  Continue current medications.  Continue to monitor blood pressure and adjust medications as needed.  Hyperlipidemia: Continue Lipitor  BPH:On Flomax and Proscar.  Diabetes type 2: Blood glucose running on the higher side.  On insulin and oral medications at home.  He is having decreased p.o. intake and at risk for hypoglycemia.  Consulted diabetes coordinator. Continue to monitor Accu-Cheks and adjust insulin as needed. CBG (last 3)  Recent Labs    03/11/20 0737 03/11/20 1146 03/11/20 1633  GLUCAP 239* 218* 224*   Diarrhea: Has slowed down. Continue imodiumas needed.Low suspicion for an infectious etiology  Depression. Decreased appetite-patient is not eating enough to be able to safely discharge him home.  Started Remeron.  Dose increased. Also include Megace.  Patient is on Eliquis which should help to minimize DVT risk. IV  fluids discontinued.  Patient encouraged to  eat. May benefit from  Pressure Injury 03/05/20 Sacrum Unstageable - Full thickness tissue loss in which the base of the injury is covered by slough (yellow, tan, gray, green or brown) and/or eschar (tan, brown or black) in the wound bed. Slough filled ulcer (Active)  03/05/20 1121  Location: Sacrum  Location Orientation:   Staging: Unstageable - Full thickness tissue loss in which the base of the injury is covered by slough (yellow, tan, gray, green or brown) and/or eschar (tan, brown or black) in the wound bed.  Wound Description (Comments): Slough filled ulcer  Present on Admission: Yes    Estimated body mass index is 23.06 kg/m as calculated from the following:   Height as of this encounter: 5\' 11"  (1.803 m).   Weight as of this encounter: 75 kg.  DVT prophylaxis:Eliquis Code Status:Full Family Communication  talked to wife [name] on phone Status is: Inpatient  Remains inpatient appropriate because:IV treatments appropriate due to intensity of illness or inability to take PO, starting Remeron.   Dispo: The patient is from: Home Anticipated d/c is NL:GXQJ?  Versus SNF Anticipated d/c date is: 1 to 2 days provided creatinine improves. Patient currently is not medically stable to d/c.    Consultants:Nephrology  Procedures:None  Antimicrobials: Subjective: No eye contact.  No nausea no vomiting.  Appears depressed.  No fever no chills.  Denies any suicidal ideation.  Objective: Vitals:   03/10/20 2101 03/11/20 0457 03/11/20 0500 03/11/20 1326  BP: 122/76 (!) 142/73  109/65  Pulse: 96 99  88  Resp: 16   12  Temp: 98.1 F (36.7 C) 99.8 F (37.7 C)  98.2 F (36.8 C)  TempSrc: Oral Oral  Oral  SpO2: 100% 97%  99%  Weight:   75 kg   Height:        Intake/Output Summary (Last 24 hours) at 03/11/2020 1941 Last data filed at 03/11/2020 1700 Gross per 24 hour  Intake 660 ml    Output 500 ml  Net 160 ml   Filed Weights   03/06/20 0559 03/10/20 0500 03/11/20 0500  Weight: 69.4 kg 75.1 kg 75 kg    Examination:  General: Appear in mild distress, no Rash; Oral Mucosa Clear, moist. no Abnormal Neck Mass Or lumps, Conjunctiva normal  Cardiovascular: S1 and S2 Present, no Murmur, Respiratory: good respiratory effort, Bilateral Air entry present and Clear to Auscultation, no Crackles, no wheezes Abdomen: Bowel Sound present, Soft and no tenderness Extremities: no Pedal edema, no calf tenderness Neurology: alert and oriented to place and person affect flat in affect. no new focal deficit Gait not checked due to patient safety concerns     Data Reviewed: I have personally reviewed following labs and imaging studies  CBC: Recent Labs  Lab 03/05/20 0648 03/07/20 0352 03/10/20 0531 03/11/20 0438  WBC 10.8* 10.6* 9.0 9.9  NEUTROABS  --  9.4*  --   --   HGB 10.1* 10.2* 9.7* 10.1*  HCT 31.4* 32.8* 30.9* 30.8*  MCV 79.3* 79.6* 80.1 77.2*  PLT 113* 123* 121* 194*   Basic Metabolic Panel: Recent Labs  Lab 03/07/20 0352 03/08/20 0943 03/09/20 0512 03/10/20 0531 03/11/20 0438  NA 134* 134* 130* 131* 130*  K 3.9 3.8 4.2 4.2 5.6*  CL 108 109 104 103 104  CO2 16* 15* 15* 18* 16*  GLUCOSE 240* 283* 247* 264* 265*  BUN 61* 59* 55* 61* 56*  CREATININE 1.99* 2.11* 2.32* 2.34* 1.90*  CALCIUM 8.9 8.5* 8.3* 8.6* 8.3*  PHOS  --  2.1* 2.3*  --   --    GFR: Estimated Creatinine Clearance: 38.9 mL/min (A) (by C-G formula based on SCr of 1.9 mg/dL (H)). Liver Function Tests: Recent Labs  Lab 03/08/20 0943 03/09/20 0512  ALBUMIN 1.8* 1.8*   No results for input(s): LIPASE, AMYLASE in the last 168 hours. No results for input(s): AMMONIA in the last 168 hours. Coagulation Profile: No results for input(s): INR, PROTIME in the last 168 hours. Cardiac Enzymes: No results for input(s): CKTOTAL, CKMB, CKMBINDEX, TROPONINI in the last 168 hours. BNP (last 3  results) No results for input(s): PROBNP in the last 8760 hours. HbA1C: No results for input(s): HGBA1C in the last 72 hours. CBG: Recent Labs  Lab 03/10/20 1548 03/10/20 2104 03/11/20 0737 03/11/20 1146 03/11/20 1633  GLUCAP 247* 268* 239* 218* 224*   Lipid Profile: No results for input(s): CHOL, HDL, LDLCALC, TRIG, CHOLHDL, LDLDIRECT in the last 72 hours. Thyroid Function Tests: No results for input(s): TSH, T4TOTAL, FREET4, T3FREE, THYROIDAB in the last 72 hours. Anemia Panel: No results for input(s): VITAMINB12, FOLATE, FERRITIN, TIBC, IRON, RETICCTPCT in the last 72 hours. Sepsis Labs: No results for input(s): PROCALCITON, LATICACIDVEN in the last 168 hours.  Recent Results (from the past 240 hour(s))  SARS Coronavirus 2 by RT PCR (hospital order, performed in [facility] hospital lab) Nasopharyngeal Nasopharyngeal Swab     Status: None   Collection Time: 03/04/20  5:39 PM   Specimen: Nasopharyngeal Swab  Result Value Ref Range Status   SARS Coronavirus 2 NEGATIVE NEGATIVE Final    Comment: (NOTE) SARS-CoV-2 target nucleic acids are NOT DETECTED. The SARS-CoV-2 RNA is generally detectable in upper and lower respiratory specimens during the acute phase of infection. The lowest concentration of SARS-CoV-2 viral copies this assay can detect is 250 copies / mL. A negative result does not preclude SARS-CoV-2 infection and should not be used as the sole basis for treatment or other patient management decisions.  A negative result may occur with improper specimen collection / handling, submission of specimen other than nasopharyngeal swab, presence of viral mutation(s) within the areas targeted by this assay, and inadequate number of viral copies (<250 copies / mL). A negative result must be combined with clinical observations, patient history, and epidemiological information. Fact Sheet for Patients:   StrictlyIdeas.no Fact Sheet for Healthcare  Providers: BankingDealers.co.za This test is not yet approved or cleared  by the [place] FDA and has been authorized for detection and/or diagnosis of SARS-CoV-2 by FDA under an Emergency Use Authorization (EUA).  This EUA will remain in effect (meaning this test can be used) for the duration of the COVID-19 declaration under Section 564(b)(1) of the Act, 21 U.S.C. section 360bbb-3(b)(1), unless the authorization is terminated or revoked sooner. Performed at [facility], [address] [place], [place] [address]   Culture, Urine     Status: None   Collection Time: 03/10/20 11:03 AM   Specimen: Urine, Clean Catch  Result Value Ref Range Status   Specimen Description   Final    URINE, CLEAN CATCH Performed at [facility], [place] [address] [place], [place] [address]    Special Requests   Final    NONE Performed at [facility], [place] [address] [place], [place] [address]    Culture   Final    NO GROWTH Performed at [facility], [place] [address], [place], [place] [address]    Report Status 03/11/2020 FINAL  Final         Radiology Studies: No results found.      Scheduled Meds: . apixaban  5 mg Oral BID  . atorvastatin  80 mg Oral q1800  . chlorhexidine  15 mL Mouth Rinse BID  . finasteride  5 mg Oral Daily  . insulin aspart  0-15 Units Subcutaneous TID WC  . insulin aspart  0-5 Units Subcutaneous QHS  . insulin aspart  3 Units Subcutaneous TID WC  . insulin glargine  22 Units Subcutaneous Daily  . mouth rinse  15 mL Mouth Rinse q12n4p  . megestrol  400 mg Oral BID  . metoprolol tartrate  12.5 mg Oral BID  . mirtazapine  15 mg Oral QHS  . mycophenolate  360 mg Oral BID  . nystatin  5 mL Oral QID  . predniSONE  10 mg Oral Q breakfast  . sodium bicarbonate  1,300 mg Oral TID  . sodium chloride flush  3 mL Intravenous Q12H  . sodium zirconium cyclosilicate  10 g Oral Daily  .  tacrolimus  1 mg Oral BID  . tamsulosin  0.4 mg Oral QPC breakfast   Continuous Infusions: . sodium chloride 250 mL (03/05/20 1710)     LOS: 7 days     Edwina Barth, MD Pager on amion 03/11/2020, 7:41 PM

## 2020-03-11 NOTE — Progress Notes (Addendum)
[facility] Progress Note  Subjective: UOP 450 yest, creat down 1.9 this am.  No c/o's.    Vitals:   03/10/20 2101 03/11/20 0457 03/11/20 0500 03/11/20 1326  BP: 122/76 (!) 142/73  109/65  Pulse: 96 99  88  Resp: 16   12  Temp: 98.1 F (36.7 C) 99.8 F (37.7 C)  98.2 F (36.8 C)  TempSrc: Oral Oral  Oral  SpO2: 100% 97%  99%  Weight:   75 kg   Height:        Exam: General: looks a lot stronger than admission ENT: thrush in cheeks is now gone Heart: RRR, no rub Lungs: clear ant Abdomen: soft, nontender, RLQ renal allograft nontender Extremities: no edema Dialysis Access: LUE +large aneurysmal AVF with minimal thrill and bruit but pulsatile GU: condom cath with clear yellow urine in bag   Renal Tx [place] 6/10 > IMPRESSION: 1. Stable right transplant kidney.  No hydronephrosis. 2. Prostate enlargement    UA 6/15 - cloudy, yellow,++ LE/ neg nitrite,+ Hb, 11-20 rbc/ >50 wbc/++ bacteria    CT abd / pelvis on 6/09 - ...a transplant kidney is seen within the right renal pelvis. This is normal in size without evidence of focal lesions, hydronephrosis or renal calculi. Bladder is unremarkable.    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 Recent baseline renal function 1-1.5 during 12/2019 admission, now with AKI cr 2.9 which is most likely hypovolemia in setting of V/D/poor po intake + tacrolimus toxicity.  FeNa initially low.  Tac level returned at 14.5 form 6/11 which is high.  Tac dose reduced to 1.5 bid then to 1.0mg  bid and 2 doses were held.  Today creat down today to 1.9 which probably represents improving tacrolimus toxicity. Will continue lower dose of 1 mg bid for now and get new level before Friday am dose , if still here. From a renal standpoint is stable for dc.  Will need close f/u of tac levels by his nephrologist in [facility], and I will d/w him prior to dc.  Will follow.    **Transplant immunuosuppression: Cont myfortic, pred. See above for tacrolimus.    **Hematuria/pyuria:  has outpt urologist who can follow up. Urine in bag isn't visibly bloody.  Repeat UA showing lots of bacteria/ WBC"s and some RBC's. Urine culture is pending. Had pseudomonas UTI in April 2021. Low grade temps not symptomatic. Per primary.   **Metabolic acidosis: Baseline mild metabolic acidosis with serum bicarb <20 during 12/2019 admission,  bicarb 11 this admission. Multifactorial - AKI, CNI, diarrhea. VBG ok on admit. Started po bicarb 1300 TID  **Dysphagia: Being treated with fluconazole for thrush which has resolved on exam. Diflucan was stopped yest 6/15 and nystatin S/S started due to diflucan effects on tacrolimus levels. Pt is eating/ swallowing better but not eating a lot per the primary team.   **Diarrhea, abd pain: Per primary. Appears to have resolved.   **HTN: BP good on meds.   **Malnutrition: albumin < 2, on supplements, hospitalist added remeron    Rob Ryan Rice 03/11/2020, 3:11 PM   Recent Labs  Lab 03/08/20 0943 03/08/20 0943 03/09/20 0512 03/09/20 0512 03/10/20 0531 03/11/20 0438  K 3.8   < > 4.2   < > 4.2 5.6*  BUN 59*   < > 55*   < > 61* 56*  CREATININE 2.11*   < > 2.32*   < >  2.34* 1.90*  CALCIUM 8.5*   < > 8.3*   < > 8.6* 8.3*  PHOS 2.1*  --  2.3*  --   --   --   HGB  --   --   --   --  9.7* 10.1*   < > = values in this interval not displayed.   Inpatient medications: . apixaban  5 mg Oral BID  . atorvastatin  80 mg Oral q1800  . chlorhexidine  15 mL Mouth Rinse BID  . finasteride  5 mg Oral Daily  . insulin aspart  0-15 Units Subcutaneous TID WC  . insulin aspart  0-5 Units Subcutaneous QHS  . insulin aspart  3 Units Subcutaneous TID WC  . insulin glargine  22 Units Subcutaneous Daily  . mouth rinse  15 mL Mouth Rinse q12n4p  . metoprolol tartrate  12.5 mg Oral BID  . mirtazapine  7.5 mg Oral QHS  . mycophenolate  360 mg Oral  BID  . nystatin  5 mL Oral QID  . predniSONE  10 mg Oral Q breakfast  . sodium bicarbonate  1,300 mg Oral TID  . sodium chloride flush  3 mL Intravenous Q12H  . sodium zirconium cyclosilicate  10 g Oral Daily  . tacrolimus  1 mg Oral BID  . tamsulosin  0.4 mg Oral QPC breakfast   . sodium chloride 250 mL (03/05/20 1710)   sodium chloride, acetaminophen **OR** acetaminophen, liver oil-zinc oxide, loperamide, ondansetron **OR** ondansetron (ZOFRAN) IV

## 2020-03-11 NOTE — Progress Notes (Signed)
Report received from [place]. Agree with previous assessment. Maintain current plan of care

## 2020-03-12 ENCOUNTER — Other Ambulatory Visit: Payer: Self-pay | Admitting: Physical Medicine and Rehabilitation

## 2020-03-12 LAB — BASIC METABOLIC PANEL
Anion gap: 11 (ref 5–15)
BUN: 66 mg/dL — ABNORMAL HIGH (ref 8–23)
CO2: 19 mmol/L — ABNORMAL LOW (ref 22–32)
Calcium: 8.4 mg/dL — ABNORMAL LOW (ref 8.9–10.3)
Chloride: 99 mmol/L (ref 98–111)
Creatinine, Ser: 2.57 mg/dL — ABNORMAL HIGH (ref 0.61–1.24)
GFR calc Af Amer: 28 mL/min — ABNORMAL LOW (ref >60)
GFR calc non Af Amer: 24 mL/min — ABNORMAL LOW (ref >60)
Glucose, Bld: 261 mg/dL — ABNORMAL HIGH (ref 70–99)
Potassium: 4 mmol/L (ref 3.5–5.1)
Sodium: 129 mmol/L — ABNORMAL LOW (ref 135–145)

## 2020-03-12 LAB — GLUCOSE, CAPILLARY
Glucose-Capillary: 246 mg/dL — ABNORMAL HIGH (ref 70–99)
Glucose-Capillary: 296 mg/dL — ABNORMAL HIGH (ref 70–99)
Glucose-Capillary: 249 mg/dL — ABNORMAL HIGH (ref 70–99)
Glucose-Capillary: 325 mg/dL — ABNORMAL HIGH (ref 70–99)

## 2020-03-12 MED ORDER — SODIUM CHLORIDE 0.9 % IV SOLN: INTRAVENOUS | Status: DC

## 2020-03-12 NOTE — Plan of Care (Signed)
  Problem: Health Behavior/Discharge Planning: Goal: Ability to manage health-related needs will improve Outcome: Progressing   Problem: Clinical Measurements: Goal: Complications related to the disease process or treatment will be avoided or minimized Outcome: Progressing Goal: Dialysis access will remain free of complications Outcome: Progressing   Problem: Activity: Goal: Activity intolerance will improve Outcome: Progressing   Problem: Fluid Volume: Goal: Fluid volume balance will be maintained or improved Outcome: Progressing   Problem: Nutritional: Goal: Ability to make appropriate dietary choices will improve Outcome: Progressing   Problem: Respiratory: Goal: Respiratory symptoms related to disease process will be avoided Outcome: Progressing   Problem: Self-Concept: Goal: Body image disturbance will be avoided or minimized Outcome: Progressing   Problem: Urinary Elimination: Goal: Progression of disease will be identified and treated Outcome: Progressing   Problem: Education: Goal: Ability to describe self-care measures that may prevent or decrease complications (Diabetes Survival Skills Education) will improve Outcome: Progressing Goal: Individualized Educational Video(s) Outcome: Progressing   Problem: Coping: Goal: Ability to adjust to condition or change in health will improve Outcome: Progressing   Problem: Fluid Volume: Goal: Ability to maintain a balanced intake and output will improve Outcome: Progressing   Problem: Health Behavior/Discharge Planning: Goal: Ability to identify and utilize available resources and services will improve Outcome: Progressing Goal: Ability to manage health-related needs will improve Outcome: Progressing   Problem: Metabolic: Goal: Ability to maintain appropriate glucose levels will improve Outcome: Progressing   Problem: Nutritional: Goal: Maintenance of adequate nutrition will improve Outcome: Progressing Goal:  Progress toward achieving an optimal weight will improve Outcome: Progressing   Problem: Skin Integrity: Goal: Risk for impaired skin integrity will decrease Outcome: Progressing   Problem: Tissue Perfusion: Goal: Adequacy of tissue perfusion will improve Outcome: Progressing   Problem: Nutrition Goal: Patient maintains adequate hydration Outcome: Progressing Goal: Patient maintains weight Outcome: Progressing Goal: Patient/Family demonstrates understanding of diet Outcome: Progressing Goal: Patient/Family independently completes tube feeding Outcome: Progressing Goal: Patient will have no more than 5 lb weight change during LOS Outcome: Progressing Goal: Patient will utilize adaptive techniques to administer nutrition Outcome: Progressing Goal: Patient will verbalize dietary restrictions Outcome: Progressing   Problem: Education: Goal: Knowledge of General Education information will improve Description: Including pain rating scale, medication(s)/side effects and non-pharmacologic comfort measures Outcome: Progressing   Problem: Health Behavior/Discharge Planning: Goal: Ability to manage health-related needs will improve Outcome: Progressing   Problem: Clinical Measurements: Goal: Ability to maintain clinical measurements within normal limits will improve Outcome: Progressing Goal: Will remain free from infection Outcome: Progressing Goal: Diagnostic test results will improve Outcome: Progressing Goal: Respiratory complications will improve Outcome: Progressing Goal: Cardiovascular complication will be avoided Outcome: Progressing   Problem: Activity: Goal: Risk for activity intolerance will decrease Outcome: Progressing   Problem: Nutrition: Goal: Adequate nutrition will be maintained Outcome: Progressing   Problem: Coping: Goal: Level of anxiety will decrease Outcome: Progressing   Problem: Elimination: Goal: Will not experience complications related to  bowel motility Outcome: Progressing Goal: Will not experience complications related to urinary retention Outcome: Progressing   Problem: Pain Managment: Goal: General experience of comfort will improve Outcome: Progressing   Problem: Safety: Goal: Ability to remain free from injury will improve Outcome: Progressing   Problem: Skin Integrity: Goal: Risk for impaired skin integrity will decrease Outcome: Progressing

## 2020-03-12 NOTE — Progress Notes (Signed)
Inpatient Diabetes Program Recommendations  AACE/ADA: New Consensus Statement on Inpatient Glycemic Control (2015)  Target Ranges:  Prepandial:   less than 140 mg/dL      Peak postprandial:   less than 180 mg/dL (1-2 hours)      Critically ill patients:  140 - 180 mg/dL   Lab Results  Component Value Date   GLUCAP 246 (H) 03/12/2020   HGBA1C 10.9 (H) 03/05/2020    Review of Glycemic Control  Current orders for Inpatient glycemic control: Lantus 22 units QD, Novolog 0-15 units tidwc and hs + 3 units tidwc  Inpatient Diabetes Program Recommendations:     Increase Lantus to 24 units QD Increase Novolog to 5 units tidwc  Follow glucose trends daily.  Thank you. Lorenda Peck, RD, LDN, CDE Inpatient Diabetes Coordinator [phone]

## 2020-03-12 NOTE — Progress Notes (Signed)
Physical Therapy Treatment Patient Details Name: Kenroy Timberman MRN: [id number] DOB: 1950-12-30 Today's Date: 03/12/2020    History of Present Illness 69 y.o. male with medical history significant for ESRD s/p renal transplant on immunosuppressants,  hospitalized 12/29/19-01/10/20 for CVA with residual expressive aphasia, atrial fibrillation on Eliquis, type 2 diabetes, hypertension, hyperlipidemia, PVD, and BPH who is admitted with acute renal failure.    PT Comments    Pt declines OOB activity this session; therapist offers transfer to bed with education of importance of therapy to improve but pt continues to decline. Pt agreeable to supine therapeutic exercise. Pt able to perform exercises with verbal instruction and AAROM for initial rep for better understanding of exercise performance; good motor control and AROM for strengthening with exercises. Pt fatigues with exercises requiring therapeutic rest breaks and declines additional exercise. Will continue to educate and motivate pt on OOB activity to improve strength and progress independence with mobility. Patient will benefit from continued physical therapy in hospital and recommendations below to increase strength, balance, endurance for safe ADLs and gait.   Follow Up Recommendations  SNF (HHPT and 24 hour S/A if declines placement)     Equipment Recommendations       Recommendations for Other Services       Precautions / Restrictions Precautions Precautions: Fall Precaution Comments: aphasia Restrictions Weight Bearing Restrictions: No    Mobility  Bed Mobility     Transfers     Ambulation/Gait      Stairs             Wheelchair Mobility    Modified Rankin (Stroke Patients Only)       Balance                     Cognition Arousal/Alertness: Awake/alert Behavior During Therapy: Flat affect Overall Cognitive Status: No family/caregiver present to determine baseline cognitive functioning              Exercises General Exercises - Lower Extremity Ankle Circles/Pumps: Supine;Both;15 reps Quad Sets: Supine;Both;10 reps Short Arc Quad: Supine;Both;15 reps Hip ABduction/ADduction: Supine;Both;10 reps    General Comments        Pertinent Vitals/Pain Pain Assessment: No/denies pain    Home Living                      Prior Function            PT Goals (current goals can now be found in the care plan section) Progress towards PT goals: Progressing toward goals (limited, only agreeable to supine TE)    Frequency    Min 3X/week      PT Plan Current plan remains appropriate    Co-evaluation              AM-PAC PT "6 Clicks" Mobility   Outcome Measure  Help needed turning from your back to your side while in a flat bed without using bedrails?: A Lot Help needed moving from lying on your back to sitting on the side of a flat bed without using bedrails?: A Lot Help needed moving to and from a bed to a chair (including a wheelchair)?: Total Help needed standing up from a chair using your arms (e.g., wheelchair or bedside chair)?: A Lot Help needed to walk in hospital room?: Total Help needed climbing 3-5 steps with a railing? : Total 6 Click Score: 9    End of Session   Activity Tolerance: Patient tolerated treatment well Patient  left: in bed;with call bell/phone within reach;with bed alarm set Nurse Communication: Mobility status PT Visit Diagnosis: Muscle weakness (generalized) (M62.81);Difficulty in walking, not elsewhere classified (R26.2)     Time: 8295-6213 PT Time Calculation (min) (ACUTE ONLY): 12 min  Charges:  $Therapeutic Exercise: 8-22 mins                      Tori Naje Rice PT, DPT 03/12/20, 10:30 AM

## 2020-03-12 NOTE — Progress Notes (Signed)
[facility] Progress Note  Subjective: UOP 89 yest, creat down 1.9 this am.  No c/o's.    Vitals:   03/11/20 1326 03/11/20 2038 03/12/20 0605 03/12/20 1305  BP: 109/65 106/74 123/73 101/68  Pulse: 88 80 78 87  Resp: 12 18 18 18   Temp: 98.2 F (36.8 C) 98.1 F (36.7 C) 97.8 F (36.6 C) 98.2 F (36.8 C)  TempSrc: Oral Oral Oral Oral  SpO2: 99% 98% 100% 100%  Weight:      Height:        Exam: General: stable, seen in room, lying flat, no distress Heart: RRR, no rub Lungs: clear ant Abdomen: soft, nontender, RLQ renal allograft nontender Extremities: no edema Dialysis Access: LUE +large aneurysmal AVF with minimal thrill and bruit but pulsatile GU: condom cath with clear yellow urine in bag   Renal Tx [place] 6/10 > IMPRESSION: 1. Stable right transplant kidney.  No hydronephrosis. 2. Prostate enlargement    UA 6/15 - cloudy, yellow,++ LE/ neg nitrite,+ Hb, 11-20 rbc/ >50 wbc/++ bacteria    CT abd / pelvis on 6/09 - ...a transplant kidney is seen within the right renal pelvis. This is normal in size without evidence of focal lesions, hydronephrosis or renal calculi. Bladder is unremarkable.    Assessment/ Plan: 1. 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 Recent baseline renal function 1-1.5 during 12/2019 admission, now with AKI cr 2.9 which is most likely hypovolemia in setting of V/D/poor po intake + tacrolimus toxicity.  FeNa initially low.  Tac level returned at 14.5 form 6/11 which is high.  Tac dose reduced to 1.5 bid then to 1.0mg  bid and 2 doses were held.  Yest creat better 1.9 but today back up to 2.5.  Suspect either diflucan effect kicking on raising tac level, vs getting dehydrated. Plan is to start IVF"s at 75 cc/hr, repeat creat in am. Cont lowered dose tac 1 mg bid for now.  Draw new trough level in am Friday.  2. Transplant immunuosuppression: Cont myfortic, pred. See above for tacrolimus.    3. Chronic atrial fib: on eliquis and metoprolol 12.5 bid, heart rate 80-90's.  4. Hematuria/pyuria:  has outpt urologist who can follow up. Urine not bloody. Looks dark today.   Repeat UA showing lots of bacteria/ WBC"s and some RBC's >> however, urine culture back today and is negative, thankfully (had pseudomonas UTI in April 2021). 5. Hyperkalemia: changed to renal diet and getting qd Lokelma. K+ down 4.0 today 6. Metabolic acidosis: multifact - started po bicarb 1300 TID. CO2 19 today 7. Dysphagia: Being treated with fluconazole for thrush which has resolved on exam. Diflucan was stopped 6/15 due to possibility of worsening prograf toxicity,  and switched to nystatin S/S  Pt is swallowing better but not eating a lot per the primary team.  8. Diarrhea, abd pain: Per primary. Appears to have resolved.  9. BP: bp's are on the low side, getting metoprolol for afib only at 12.5 bid. Starting IVF back 10. Malnutrition: albumin < 2, on supplements, hospitalist added remeron  11. H/o CVA - chronic debility, appears bed bound    Rob Maura Braaten 03/12/2020, 2:33 PM   Recent Labs  Lab 03/08/20 0943 03/08/20 0943 03/09/20 0512 03/09/20 0512 03/10/20 0531 03/10/20 0531 03/11/20 0438 03/12/20 0520  K 3.8   < > 4.2   < > 4.2   < > 5.6* 4.0  BUN 59*   < >  55*   < > 61*   < > 56* 66*  CREATININE 2.11*   < > 2.32*   < > 2.34*   < > 1.90* 2.57*  CALCIUM 8.5*   < > 8.3*   < > 8.6*   < > 8.3* 8.4*  PHOS 2.1*  --  2.3*  --   --   --   --   --   HGB  --   --   --   --  9.7*  --  10.1*  --    < > = values in this interval not displayed.   Inpatient medications: . apixaban  5 mg Oral BID  . atorvastatin  80 mg Oral q1800  . chlorhexidine  15 mL Mouth Rinse BID  . finasteride  5 mg Oral Daily  . insulin aspart  0-15 Units Subcutaneous TID WC  . insulin aspart  0-5 Units Subcutaneous QHS  . insulin aspart  3 Units Subcutaneous TID WC  . insulin glargine  22 Units Subcutaneous Daily  . mouth rinse   15 mL Mouth Rinse q12n4p  . megestrol  400 mg Oral BID  . metoprolol tartrate  12.5 mg Oral BID  . mirtazapine  15 mg Oral QHS  . mycophenolate  360 mg Oral BID  . nystatin  5 mL Oral QID  . predniSONE  10 mg Oral Q breakfast  . sodium bicarbonate  1,300 mg Oral TID  . sodium chloride flush  3 mL Intravenous Q12H  . sodium zirconium cyclosilicate  10 g Oral Daily  . tacrolimus  1 mg Oral BID  . tamsulosin  0.4 mg Oral QPC breakfast   . sodium chloride 250 mL (03/05/20 1710)   sodium chloride, acetaminophen **OR** acetaminophen, liver oil-zinc oxide, loperamide, ondansetron **OR** ondansetron (ZOFRAN) IV

## 2020-03-12 NOTE — Progress Notes (Signed)
PROGRESS NOTE    Ryan Rice  IPJ:[id number] DOB: Dec 29, 1950 DOA: 03/04/2020 PCP: Ryan Marble, PA-C    Brief Narrative:Patient is a 69 year old male with history of ESRD status post left renal transplant, history of CVA with residual expressive aphasia, proximal A. fib on Eliquis, diabetes mellitustype 2,hypertension, hyperlipidemia, peripheral vascular disease, BPH who presents for the evaluation of low blood pressure, decreased oral intake, epigastric pain. He reported poor oral intake for a week. He also reported decreased urinary output couple of days ago. Also complained of diarrhea. On presentation he was hemodynamically stable. He was found to have AKI with creatinine of 2.9. His baseline creatinine is normal. Started on IV fluids, now stopped. Nephrology also consulted.Physical therapy evaluated him and recommended skilled nursing facility on discharge.  Assessment & Plan:   Principal Problem:   ARF (acute renal failure) (HCC) Active Problems:   Kidney transplant recipient   Diabetes [facility])   Atrial fibrillation, chronic (HCC)   Hypertension associated with diabetes ([place])   History of CVA (cerebrovascular accident)   Hyperlipidemia associated with type 2 diabetes mellitus ([place])   Pressure injury of skin  AKI on a renal transplant patient: Baseline creatinine normal. His creatinine was 1.01 on 01/24/2020. Presented with creatinine of 2.9. Most likely prerenal AKI associatedwith decreased oral intake. Nephrology consulted appreciate assistance. Renal function worsening again. Suspect secondary to Diflucan. Nephrology resumed IV fluid. Monitor renal function.  ESRD status post renal transplant: Follows with nephrology at [facility]. On Prograf, Myfortic, prednisone.  Tacrolimus level high.  Likely due to Diflucan. Continue further medications per nephrology. Medication adjusted by nephrology.  Odynophagia/dysphagia:Followed by speech. Recommended  dysphagia 3 diet. High suspicion for candidal esophagitis due to his immunocompromised status.He was found to have oral candidiasis. He presented with decreased oral intake. Started on fluconazole.  Change to nystatin due to concern with Prograf. Continue for 10 to 14 days course.  Persistent A. fib: Currently rate is controlled. On Eliquis 5 mg twice a day for anticoagulation.  Debility/deconditioning/generalized weakness/history of CVA:Has residual expressive aphasia and gait imbalance. He is confused at baseline after the stroke as per his wife .He was admitted in April 2021 and was found to have evolving subacute left ACA territory infarct with petechial hemorrhage. On Eliquis and Lipitor at home. PT/OT recommend skilled nursingfacility. He has home health and follows with PT,OT, Speech.Wife is interested on taking him home with home health  Hypertension: Fluctuating blood pressure.  Continue current medications.  Continue to monitor blood pressure and adjust medications as needed.  Hyperlipidemia: Continue Lipitor  BPH:On Flomax and Proscar.  Diabetes type 2: Blood glucose running on the higher side.  On insulin and oral medications at home.  He is having decreased p.o. intake and at risk for hypoglycemia.  Consulted diabetes coordinator. Continue to monitor Accu-Cheks and adjust insulin as needed. CBG (last 3)  Recent Labs    03/12/20 0736 03/12/20 1142 03/12/20 1707  GLUCAP 246* 296* 249*   Diarrhea: Has slowed down. Continue imodiumas needed.Low suspicion for an infectious etiology  Depression. Decreased appetite-patient is not eating enough to be able to safely discharge him home.  Started Remeron.  Dose increased. Also include Megace.  Patient is on Eliquis which should help to minimize DVT risk. Tolerating increased dose of the Remeron.  Will monitor.  Pressure Injury 03/05/20 Sacrum Unstageable - Full thickness tissue loss in which the base of the injury  is covered by slough (yellow, tan, gray, green or brown) and/or eschar (tan, brown  or black) in the wound bed. Slough filled ulcer (Active)  03/05/20 1121  Location: Sacrum  Location Orientation:   Staging: Unstageable - Full thickness tissue loss in which the base of the injury is covered by slough (yellow, tan, gray, green or brown) and/or eschar (tan, brown or black) in the wound bed.  Wound Description (Comments): Slough filled ulcer  Present on Admission: Yes    Estimated body mass index is 23.06 kg/m as calculated from the following:   Height as of this encounter: 5\' 11"  (1.803 m).   Weight as of this encounter: 75 kg.  DVT prophylaxis:Eliquis Code Status:Full Family Communication  talked to wife Ryan Rice on phone Status is: Inpatient  Remains inpatient appropriate because:IV treatments appropriate due to intensity of illness or inability to take PO, starting Remeron.   Dispo: The patient is from: Home Anticipated d/c is HB:ZJIR?  Versus SNF Anticipated d/c date is: 1 to 2 days provided creatinine improves. Patient currently is not medically stable to d/c.    Consultants:Nephrology  Procedures:None  Antimicrobials: Subjective: More interactive.  No nausea no vomiting.  No fever no chills.  No chest pain.  Reports constipation.  Objective: Vitals:   03/11/20 1326 03/11/20 2038 03/12/20 0605 03/12/20 1305  BP: 109/65 106/74 123/73 101/68  Pulse: 88 80 78 87  Resp: 12 18 18 18   Temp: 98.2 F (36.8 C) 98.1 F (36.7 C) 97.8 F (36.6 C) 98.2 F (36.8 C)  TempSrc: Oral Oral Oral Oral  SpO2: 99% 98% 100% 100%  Weight:      Height:        Intake/Output Summary (Last 24 hours) at 03/12/2020 2004 Last data filed at 03/12/2020 1834 Gross per 24 hour  Intake 553.12 ml  Output 300 ml  Net 253.12 ml   Filed Weights   03/06/20 0559 03/10/20 0500 03/11/20 0500  Weight: 69.4 kg 75.1 kg 75 kg     Examination:  General: Appear in mild distress, no Rash; Oral Mucosa Clear, moist. no Abnormal Neck Mass Or lumps, Conjunctiva normal  Cardiovascular: S1 and S2 Present, no Murmur, Respiratory: good respiratory effort, Bilateral Air entry present and Clear to Auscultation, no Crackles, no wheezes Abdomen: Bowel Sound present, Soft and no tenderness Extremities: no Pedal edema, no calf tenderness Neurology: alert and oriented to place and person affect flat in affect. no new focal deficit Gait not checked due to patient safety concerns     Data Reviewed: I have personally reviewed following labs and imaging studies  CBC: Recent Labs  Lab 03/07/20 0352 03/10/20 0531 03/11/20 0438  WBC 10.6* 9.0 9.9  NEUTROABS 9.4*  --   --   HGB 10.2* 9.7* 10.1*  HCT 32.8* 30.9* 30.8*  MCV 79.6* 80.1 77.2*  PLT 123* 121* 678*   Basic Metabolic Panel: Recent Labs  Lab 03/08/20 0943 03/09/20 0512 03/10/20 0531 03/11/20 0438 03/12/20 0520  NA 134* 130* 131* 130* 129*  K 3.8 4.2 4.2 5.6* 4.0  CL 109 104 103 104 99  CO2 15* 15* 18* 16* 19*  GLUCOSE 283* 247* 264* 265* 261*  BUN 59* 55* 61* 56* 66*  CREATININE 2.11* 2.32* 2.34* 1.90* 2.57*  CALCIUM 8.5* 8.3* 8.6* 8.3* 8.4*  PHOS 2.1* 2.3*  --   --   --    GFR: Estimated Creatinine Clearance: 28.8 mL/min (A) (by C-G formula based on SCr of 2.57 mg/dL (H)). Liver Function Tests: Recent Labs  Lab 03/08/20 0943 03/09/20 0512  ALBUMIN 1.8* 1.8*   No  results for input(s): LIPASE, AMYLASE in the last 168 hours. No results for input(s): AMMONIA in the last 168 hours. Coagulation Profile: No results for input(s): INR, PROTIME in the last 168 hours. Cardiac Enzymes: No results for input(s): CKTOTAL, CKMB, CKMBINDEX, TROPONINI in the last 168 hours. BNP (last 3 results) No results for input(s): PROBNP in the last 8760 hours. HbA1C: No results for input(s): HGBA1C in the last 72 hours. CBG: Recent Labs  Lab 03/11/20 1633  03/11/20 2036 03/12/20 0736 03/12/20 1142 03/12/20 1707  GLUCAP 224* 236* 246* 296* 249*   Lipid Profile: No results for input(s): CHOL, HDL, LDLCALC, TRIG, CHOLHDL, LDLDIRECT in the last 72 hours. Thyroid Function Tests: No results for input(s): TSH, T4TOTAL, FREET4, T3FREE, THYROIDAB in the last 72 hours. Anemia Panel: No results for input(s): VITAMINB12, FOLATE, FERRITIN, TIBC, IRON, RETICCTPCT in the last 72 hours. Sepsis Labs: No results for input(s): PROCALCITON, LATICACIDVEN in the last 168 hours.  Recent Results (from the past 240 hour(s))  SARS Coronavirus 2 by RT PCR (hospital order, performed in [facility] hospital lab) Nasopharyngeal Nasopharyngeal Swab     Status: None   Collection Time: 03/04/20  5:39 PM   Specimen: Nasopharyngeal Swab  Result Value Ref Range Status   SARS Coronavirus 2 NEGATIVE NEGATIVE Final    Comment: (NOTE) SARS-CoV-2 target nucleic acids are NOT DETECTED. The SARS-CoV-2 RNA is generally detectable in upper and lower respiratory specimens during the acute phase of infection. The lowest concentration of SARS-CoV-2 viral copies this assay can detect is 250 copies / mL. A negative result does not preclude SARS-CoV-2 infection and should not be used as the sole basis for treatment or other patient management decisions.  A negative result may occur with improper specimen collection / handling, submission of specimen other than nasopharyngeal swab, presence of viral mutation(s) within the areas targeted by this assay, and inadequate number of viral copies (<250 copies / mL). A negative result must be combined with clinical observations, patient history, and epidemiological information. Fact Sheet for Patients:   StrictlyIdeas.no Fact Sheet for Healthcare Providers: BankingDealers.co.za This test is not yet approved or cleared  by the [place] FDA and has been authorized for detection and/or  diagnosis of SARS-CoV-2 by FDA under an Emergency Use Authorization (EUA).  This EUA will remain in effect (meaning this test can be used) for the duration of the COVID-19 declaration under Section 564(b)(1) of the Act, 21 U.S.C. section 360bbb-3(b)(1), unless the authorization is terminated or revoked sooner. Performed at [facility], [address] [place], [place] [address]   Culture, Urine     Status: None   Collection Time: 03/10/20 11:03 AM   Specimen: Urine, Clean Catch  Result Value Ref Range Status   Specimen Description   Final    URINE, CLEAN CATCH Performed at [facility], [place] [address] [place], [place] [address]    Special Requests   Final    NONE Performed at [facility], [place] [address] [place], [place] [address]    Culture   Final    NO GROWTH Performed at [facility], [place] [address], [place], [place] [address]    Report Status 03/11/2020 FINAL  Final         Radiology Studies: No results found.      Scheduled Meds: . apixaban  5 mg Oral BID  . atorvastatin  80 mg Oral q1800  . chlorhexidine  15 mL Mouth Rinse  BID  . finasteride  5 mg Oral Daily  . insulin aspart  0-15 Units Subcutaneous TID WC  . insulin aspart  0-5 Units Subcutaneous QHS  . insulin aspart  3 Units Subcutaneous TID WC  . insulin glargine  22 Units Subcutaneous Daily  . mouth rinse  15 mL Mouth Rinse q12n4p  . megestrol  400 mg Oral BID  . metoprolol tartrate  12.5 mg Oral BID  . mirtazapine  15 mg Oral QHS  . mycophenolate  360 mg Oral BID  . nystatin  5 mL Oral QID  . predniSONE  10 mg Oral Q breakfast  . sodium bicarbonate  1,300 mg Oral TID  . sodium chloride flush  3 mL Intravenous Q12H  . tacrolimus  1 mg Oral BID  . tamsulosin  0.4 mg Oral QPC breakfast   Continuous Infusions: . sodium chloride 250 mL (03/05/20 1710)  . sodium chloride 75 mL/hr at 03/12/20 1638     LOS: 8 days     Berle Mull ,  MD Pager on [facility] 03/12/2020, 8:04 PM

## 2020-03-12 NOTE — Care Management Important Message (Signed)
Important Message  Patient Details IM Letter given to [name] SW Case Manager to present to the Patient Name: Ryan Rice MRN: [id number] Date of Birth: 1951-02-24   Medicare Important Message Given:  Yes     Kerin Salen 03/12/2020, 10:34 AM

## 2020-03-13 LAB — RENAL FUNCTION PANEL
Albumin: 1.7 g/dL — ABNORMAL LOW (ref 3.5–5.0)
Anion gap: 9 (ref 5–15)
BUN: 61 mg/dL — ABNORMAL HIGH (ref 8–23)
CO2: 21 mmol/L — ABNORMAL LOW (ref 22–32)
Calcium: 8.3 mg/dL — ABNORMAL LOW (ref 8.9–10.3)
Chloride: 102 mmol/L (ref 98–111)
Creatinine, Ser: 2.6 mg/dL — ABNORMAL HIGH (ref 0.61–1.24)
GFR calc Af Amer: 28 mL/min — ABNORMAL LOW (ref >60)
GFR calc non Af Amer: 24 mL/min — ABNORMAL LOW (ref >60)
Glucose, Bld: 297 mg/dL — ABNORMAL HIGH (ref 70–99)
Phosphorus: 2.7 mg/dL (ref 2.5–4.6)
Potassium: 3.8 mmol/L (ref 3.5–5.1)
Sodium: 132 mmol/L — ABNORMAL LOW (ref 135–145)

## 2020-03-13 LAB — GLUCOSE, CAPILLARY
Glucose-Capillary: 269 mg/dL — ABNORMAL HIGH (ref 70–99)
Glucose-Capillary: 245 mg/dL — ABNORMAL HIGH (ref 70–99)
Glucose-Capillary: 217 mg/dL — ABNORMAL HIGH (ref 70–99)
Glucose-Capillary: 256 mg/dL — ABNORMAL HIGH (ref 70–99)

## 2020-03-13 LAB — CBC
HCT: 30.1 % — ABNORMAL LOW (ref 39.0–52.0)
Hemoglobin: 9.7 g/dL — ABNORMAL LOW (ref 13.0–17.0)
MCH: 25.5 pg — ABNORMAL LOW (ref 26.0–34.0)
MCHC: 32.2 g/dL (ref 30.0–36.0)
MCV: 79 fL — ABNORMAL LOW (ref 80.0–100.0)
Platelets: 148 10*3/uL — ABNORMAL LOW (ref 150–400)
RBC: 3.81 MIL/uL — ABNORMAL LOW (ref 4.22–5.81)
RDW: 17 % — ABNORMAL HIGH (ref 11.5–15.5)
WBC: 8.3 10*3/uL (ref 4.0–10.5)
nRBC: 0 % (ref 0.0–0.2)

## 2020-03-13 LAB — MAGNESIUM: Magnesium: 1.3 mg/dL — ABNORMAL LOW (ref 1.7–2.4)

## 2020-03-13 MED ORDER — MAGNESIUM SULFATE 2 GM/50ML IV SOLN
2.0000 g | Freq: Once | INTRAVENOUS | Status: AC
  Administered 2020-03-13: 2 g via INTRAVENOUS
  Filled 2020-03-13: qty 50

## 2020-03-13 MED ORDER — INSULIN GLARGINE 100 UNIT/ML ~~LOC~~ SOLN
30.0000 [IU] | Freq: Every day | SUBCUTANEOUS | Status: DC
  Administered 2020-03-13 – 2020-03-15 (×3): 30 [IU] via SUBCUTANEOUS
  Filled 2020-03-13 (×3): qty 0.3

## 2020-03-13 MED ORDER — NEPRO/CARBSTEADY PO LIQD
237.0000 mL | Freq: Two times a day (BID) | ORAL | Status: DC
  Administered 2020-03-13 – 2020-03-18 (×11): 237 mL via ORAL
  Filled 2020-03-13 (×12): qty 237

## 2020-03-13 NOTE — TOC Initial Note (Signed)
Transition of Care [facility]) - Initial/Assessment Note    Patient Details  Name: Ryan Rice MRN: [id number] Date of Birth: [birth year]  Transition of Care [facility]) CM/SW Contact:    Dessa Phi, RN Phone Number: 03/13/2020, 1:37 PM  Clinical Narrative: Active w/[facility] rep Ronalee Belts following. Patient & spouse decline SNF-wnt home w/HHC,spouse is there 24/7 to asst patient.Has own transport home.                  Expected Discharge Plan: [place] Barriers to Discharge: Continued Medical Work up   Patient Goals and CMS Choice Patient states their goals for this hospitalization and ongoing recovery are:: go home CMS Medicare.gov Compare Post Acute Care list provided to:: Patient Choice offered to / list presented to : Patient  Expected Discharge Plan and Services Expected Discharge Plan: [place]   Discharge Planning Services: CM Consult Post Acute Care Choice: [place] arrangements for the past 2 months: Single Family Home                                      Prior Living Arrangements/Services Living arrangements for the past 2 months: Single Family Home Lives with:: Spouse Patient language and need for interpreter reviewed:: Yes Do you feel safe going back to the place where you live?: Yes      Need for Family Participation in Patient Care: No (Comment) Care giver support system in place?: Yes (comment) Current home services: DME, Home PT, Home RN, Home OT, Other (comment) (active w/[facility]N/PT/OT/ST) Criminal Activity/Legal Involvement Pertinent to Current Situation/Hospitalization: No - Comment as needed  Activities of Daily Living Home Assistive Devices/Equipment: Environmental consultant (specify type), Shower chair with back (gait belt) ADL Screening (condition at time of admission) Patient's cognitive ability adequate to safely complete daily activities?: No Is the patient deaf or have difficulty hearing?: No Does the patient have  difficulty seeing, even when wearing glasses/contacts?: No Does the patient have difficulty concentrating, remembering, or making decisions?: Yes Patient able to express need for assistance with ADLs?: No (expressive aphasia) Does the patient have difficulty dressing or bathing?: Yes Independently performs ADLs?: No Does the patient have difficulty walking or climbing stairs?: Yes Weakness of Legs: Both Weakness of Arms/Hands: Both  Permission Sought/Granted Permission sought to share information with : Case Manager Permission granted to share information with : Yes, Verbal Permission Granted  Share Information with NAME: Case Manager  Permission granted to share info w AGENCY: [place]        Emotional Assessment Appearance:: Appears stated age Attitude/Demeanor/Rapport: Gracious Affect (typically observed): Accepting Orientation: : Oriented to Self, Oriented to Place, Oriented to  Time, Oriented to Situation Alcohol / Substance Use: Not Applicable Psych Involvement: No (comment)  Admission diagnosis:  ARF (acute renal failure) ([place]) [N17.9] Patient Active Problem List   Diagnosis Date Noted  . Pressure injury of skin 03/07/2020  . ARF (acute renal failure) ([place]) 03/04/2020  . History of CVA (cerebrovascular accident) 03/04/2020  . Hyperlipidemia associated with type 2 diabetes mellitus ([place]) 03/04/2020  . Cellulitis of left lower extremity 02/13/2020  . Peripheral edema   . Leukopenia   . Labile blood pressure   . Acute lower UTI   . Leukocytosis   . Thrombocytopenia ([place])   . Hyponatremia   . Steroid-induced hyperglycemia   . Ischemic stroke of frontal lobe ([place]) 01/10/2020  . Embolic infarction (  [place]) 01/10/2020  . Controlled type 2 diabetes mellitus with hyperglycemia, without long-term current use of insulin ([place])   . Renal transplant, status post   . Hypertension associated with diabetes ([place])   . Cerebral embolism with cerebral infarction 01/03/2020  . Brain  tumor ([place]) 12/30/2019  . Hip pain, bilateral 12/30/2019  . Elevated PSA 12/30/2019  . Atrial fibrillation, chronic ([place]) 12/30/2019  . Hypomagnesemia 03/20/2019  . Diabetes ([place]) 03/15/2019  . Glaucoma 03/15/2019  . Scleral hemorrhage of left eye 03/15/2019  . Gout flare 03/15/2019  . Debility 03/08/2019  . AKI (acute kidney injury) ([place])   . CAP (community acquired pneumonia)   . Kidney transplant recipient   . Sepsis ([place]) 02/27/2019   PCP:  Drosinis, Pamalee Leyden, PA-C Pharmacy:   [id number] [place], [place] [address] [place] [address] Phone: [phone] Fax: [phone]  [facility] [place], [place] [address] Phone: [phone] Fax: [phone]     Social Determinants of Health (SDOH) Interventions    Readmission Risk Interventions Readmission Risk Prevention Plan 12/31/2019  Transportation Screening Complete  HRI or Home Care Consult Complete  Palliative Care Screening Not Applicable  Medication Review (RN Care Manager) Referral to Pharmacy

## 2020-03-13 NOTE — Progress Notes (Signed)
[facility] Progress Note  Subjective: UOP 300 cc yest, creat up slightly to 2.60.  BP's are wnl. BS' 200's.   Vitals:   03/12/20 0605 03/12/20 1305 03/12/20 2032 03/13/20 0451  BP: 123/73 101/68 131/76 (!) 145/84  Pulse: 78 87 86 97  Resp: 18 18 20 18   Temp: 97.8 F (36.6 C) 98.2 F (36.8 C) 97.8 F (36.6 C) 98.7 F (37.1 C)  TempSrc: Oral Oral Oral Oral  SpO2: 100% 100% 100% 100%  Weight:      Height:        Exam: General: stable, seen in room, lying flat, no distress Heart: RRR, no rub Lungs: clear ant Abdomen: soft, nontender, RLQ renal allograft nontender Extremities: no edema Dialysis Access: LUE +large aneurysmal AVF with minimal thrill and bruit but pulsatile GU: condom cath with clear yellow urine in bag   Renal Tx [place] 6/10 > IMPRESSION: 1. Stable right transplant kidney.  No hydronephrosis. 2. Prostate enlargement    UA 6/15 - cloudy, yellow,++ LE/ neg nitrite,+ Hb, 11-20 rbc/ >50 wbc/++ bacteria    CT abd / pelvis on 6/09 - ...a transplant kidney is seen within the right renal pelvis. This is normal in size without evidence of focal lesions, hydronephrosis or renal calculi. Bladder is unremarkable.    Assessment/ Plan: 1. 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 Recent baseline renal function 1-1.5 during 12/2019 admission, now with AKI cr 2.9 which was most likely hypovolemia in setting of V/D/poor po intake + tacrolimus toxicity.  FeNa initially low.  Tac level returned at 14.5 form 6/11 which is high.  We have lowered prograf 2mg  bid  > 1mg  bid. Will get repeat trough level Sat am. Suspect also hypovolemic and resumed IVF's yesterday, cont at 75 cc/hr. No vol excess on exam.   2. Transplant immunuosuppression: Cont myfortic, pred. See above for tacrolimus.   3. Chronic atrial fib: on eliquis and metoprolol 12.5 bid, heart rate 80-90's.  4. Hematuria/pyuria:  has outpt urologist who can  follow up. Urine not bloody. Looks dark today.   Repeat UA showing lots of bacteria/ WBC"s and some RBC's >> however, urine culture back today and is negative, thankfully (had pseudomonas UTI in April 2021). 5. Hyperkalemia: changed to renal diet and getting qd Lokelma. K+ down 4.0 today 6. Metabolic acidosis: multifact - started po bicarb 1300 TID. CO2 19 today 7. Dysphagia: Being treated with fluconazole for thrush which has resolved on exam. Diflucan was stopped 6/15 due to possibility of worsening prograf toxicity,  and switched to nystatin S/S  Pt is swallowing better but not eating a lot per the primary team.  8. Diarrhea, abd pain: Per primary. Appears to have resolved.  9. BP: bp's are on the low side, getting metoprolol for afib only at 12.5 bid. Starting IVF back 10. Malnutrition: albumin < 2, on supplements, hospitalist added remeron  11. H/o CVA - chronic debility, appears bed bound    Ryan Rice 03/13/2020, 1:03 PM   Recent Labs  Lab 03/09/20 0512 03/10/20 0531 03/11/20 0438 03/11/20 0438 03/12/20 0520 03/13/20 0533  K 4.2   < > 5.6*   < > 4.0 3.8  BUN 55*   < > 56*   < > 66* 61*  CREATININE 2.32*   < > 1.90*   < > 2.57* 2.60*  CALCIUM 8.3*   < > 8.3*   < > 8.4* 8.3*  PHOS  2.3*  --   --   --   --  2.7  HGB  --    < > 10.1*  --   --  9.7*   < > = values in this interval not displayed.   Inpatient medications: . apixaban  5 mg Oral BID  . atorvastatin  80 mg Oral q1800  . chlorhexidine  15 mL Mouth Rinse BID  . feeding supplement (NEPRO CARB STEADY)  237 mL Oral BID BM  . finasteride  5 mg Oral Daily  . insulin aspart  0-15 Units Subcutaneous TID WC  . insulin aspart  0-5 Units Subcutaneous QHS  . [START ON 03/14/2020] insulin glargine  30 Units Subcutaneous Daily  . mouth rinse  15 mL Mouth Rinse q12n4p  . megestrol  400 mg Oral BID  . metoprolol tartrate  12.5 mg Oral BID  . mirtazapine  15 mg Oral QHS  . mycophenolate  360 mg Oral BID  . nystatin  5 mL Oral  QID  . predniSONE  10 mg Oral Q breakfast  . sodium bicarbonate  1,300 mg Oral TID  . sodium chloride flush  3 mL Intravenous Q12H  . tacrolimus  1 mg Oral BID  . tamsulosin  0.4 mg Oral QPC breakfast   . sodium chloride 250 mL (03/05/20 1710)  . sodium chloride 75 mL/hr at 03/13/20 0848   sodium chloride, acetaminophen **OR** acetaminophen, liver oil-zinc oxide, loperamide, ondansetron **OR** ondansetron (ZOFRAN) IV

## 2020-03-13 NOTE — Progress Notes (Signed)
PROGRESS NOTE    Ryan Rice  XLK:[id number] DOB: March 03, 1951 DOA: 03/04/2020 PCP: Mina Marble, PA-C    Brief Narrative:Patient is a 69 year old male with history of ESRD status post left renal transplant, history of CVA with residual expressive aphasia, proximal A. fib on Eliquis, diabetes mellitustype 2,hypertension, hyperlipidemia, peripheral vascular disease, BPH who presents for the evaluation of low blood pressure, decreased oral intake, epigastric pain. He reported poor oral intake for a week. He also reported decreased urinary output couple of days ago. Also complained of diarrhea. On presentation he was hemodynamically stable. He was found to have AKI with creatinine of 2.9. His baseline creatinine is normal. Started on IV fluids, now stopped. Nephrology also consulted.Physical therapy evaluated him and recommended skilled nursing facility on discharge.  Assessment & Plan:   Principal Problem:   ARF (acute renal failure) (HCC) Active Problems:   Kidney transplant recipient   Diabetes [facility])   Atrial fibrillation, chronic (HCC)   Hypertension associated with diabetes ([place])   History of CVA (cerebrovascular accident)   Hyperlipidemia associated with type 2 diabetes mellitus ([place])   Pressure injury of skin  AKI on a renal transplant patient: Baseline creatinine normal. His creatinine was 1.01 on 01/24/2020. Presented with creatinine of 2.9. Most likely prerenal AKI associatedwith decreased oral intake. Nephrology consulted appreciate assistance. Renal function worsening again. Suspect secondary to Diflucan. Nephrology resumed IV fluid. Monitor renal function.  ESRD status post renal transplant: Follows with nephrology at [facility]. On Prograf, Myfortic, prednisone.  Tacrolimus level high.  Likely due to Diflucan. Continue further medications per nephrology. Medication adjusted by nephrology.  Odynophagia/dysphagia:Followed by speech. Recommended  dysphagia 3 diet. High suspicion for candidal esophagitis due to his immunocompromised status.He was found to have oral candidiasis. He presented with decreased oral intake. Started on fluconazole.  Change to nystatin due to concern with Prograf. Continue for 10 to 14 days course.  Persistent A. fib: Currently rate is controlled. On Eliquis 5 mg twice a day for anticoagulation.  Debility/deconditioning/generalized weakness/history of CVA:Has residual expressive aphasia and gait imbalance. He is confused at baseline after the stroke as per his wife .He was admitted in April 2021 and was found to have evolving subacute left ACA territory infarct with petechial hemorrhage. On Eliquis and Lipitor at home. PT/OT recommend skilled nursingfacility. He has home health and follows with PT,OT, Speech.Wife is interested on taking him home with home health  Hypertension: Fluctuating blood pressure.  Continue current medications.  Continue to monitor blood pressure and adjust medications as needed.  Hyperlipidemia: Continue Lipitor  BPH:On Flomax and Proscar.  Diabetes type 2: Blood glucose running on the higher side.  On insulin and oral medications at home.  He is having decreased p.o. intake and at risk for hypoglycemia.  Consulted diabetes coordinator. Continue to monitor Accu-Cheks and adjust insulin as needed. CBG (last 3)  Recent Labs    03/13/20 0744 03/13/20 1148 03/13/20 1641  GLUCAP 269* 245* 217*   Diarrhea: Has slowed down. Continue imodiumas needed.Low suspicion for an infectious etiology  Depression. Decreased appetite-patient is not eating enough to be able to safely discharge him home.  Started Remeron.  Dose increased. Also include Megace.  Patient is on Eliquis which should help to minimize DVT risk. Tolerating increased dose of the Remeron.  Will monitor.  Pressure Injury 03/05/20 Sacrum Unstageable - Full thickness tissue loss in which the base of the injury  is covered by slough (yellow, tan, gray, green or brown) and/or eschar (tan, brown  or black) in the wound bed. Slough filled ulcer (Active)  03/05/20 1121  Location: Sacrum  Location Orientation:   Staging: Unstageable - Full thickness tissue loss in which the base of the injury is covered by slough (yellow, tan, gray, green or brown) and/or eschar (tan, brown or black) in the wound bed.  Wound Description (Comments): Slough filled ulcer  Present on Admission: Yes    Estimated body mass index is 23.06 kg/m as calculated from the following:   Height as of this encounter: 5\' 11"  (1.803 m).   Weight as of this encounter: 75 kg.  DVT prophylaxis:Eliquis Code Status:Full Family Communication  left voicemail to wife [name] on phone on 03/13/2020  Status is: Inpatient  Remains inpatient appropriate because:IV treatments appropriate due to intensity of illness or inability to take PO   Dispo: The patient is from: Home Anticipated d/c is GG:YIRS Anticipated d/c date is: 1 to 2 days provided creatinine improves. Patient currently is not medically stable to d/c.    Consultants:Nephrology  Procedures:None  Antimicrobials: Subjective: Sitting in chair no acute complains no fever or chills.   Objective: Vitals:   03/12/20 1305 03/12/20 2032 03/13/20 0451 03/13/20 1306  BP: 101/68 131/76 (!) 145/84 (!) 109/57  Pulse: 87 86 97 97  Resp: 18 20 18 18   Temp: 98.2 F (36.8 C) 97.8 F (36.6 C) 98.7 F (37.1 C) 98.2 F (36.8 C)  TempSrc: Oral Oral Oral Oral  SpO2: 100% 100% 100% 96%  Weight:      Height:        Intake/Output Summary (Last 24 hours) at 03/13/2020 1759 Last data filed at 03/13/2020 1220 Gross per 24 hour  Intake 390.12 ml  Output 200 ml  Net 190.12 ml   Filed Weights   03/06/20 0559 03/10/20 0500 03/11/20 0500  Weight: 69.4 kg 75.1 kg 75 kg    Examination:  General: Appear in mild distress, no Rash; Oral  Mucosa Clear, moist. no Abnormal Neck Mass Or lumps, Conjunctiva normal  Cardiovascular: S1 and S2 Present, no Murmur, Respiratory: good respiratory effort, Bilateral Air entry present and Clear to Auscultation, no Crackles, no wheezes Abdomen: Bowel Sound present, Soft and no tenderness Extremities: no Pedal edema, no calf tenderness Neurology: alert and oriented to place and person affect flat in affect. no new focal deficit Gait not checked due to patient safety concerns     Data Reviewed: I have personally reviewed following labs and imaging studies  CBC: Recent Labs  Lab 03/07/20 0352 03/10/20 0531 03/11/20 0438 03/13/20 0533  WBC 10.6* 9.0 9.9 8.3  NEUTROABS 9.4*  --   --   --   HGB 10.2* 9.7* 10.1* 9.7*  HCT 32.8* 30.9* 30.8* 30.1*  MCV 79.6* 80.1 77.2* 79.0*  PLT 123* 121* 129* 854*   Basic Metabolic Panel: Recent Labs  Lab 03/08/20 0943 03/08/20 0943 03/09/20 0512 03/10/20 0531 03/11/20 0438 03/12/20 0520 03/13/20 0533  NA 134*   < > 130* 131* 130* 129* 132*  K 3.8   < > 4.2 4.2 5.6* 4.0 3.8  CL 109   < > 104 103 104 99 102  CO2 15*   < > 15* 18* 16* 19* 21*  GLUCOSE 283*   < > 247* 264* 265* 261* 297*  BUN 59*   < > 55* 61* 56* 66* 61*  CREATININE 2.11*   < > 2.32* 2.34* 1.90* 2.57* 2.60*  CALCIUM 8.5*   < > 8.3* 8.6* 8.3* 8.4* 8.3*  MG  --   --   --   --   --   --  1.3*  PHOS 2.1*  --  2.3*  --   --   --  2.7   < > = values in this interval not displayed.   GFR: Estimated Creatinine Clearance: 28.4 mL/min (A) (by C-G formula based on SCr of 2.6 mg/dL (H)). Liver Function Tests: Recent Labs  Lab 03/08/20 0943 03/09/20 0512 03/13/20 0533  ALBUMIN 1.8* 1.8* 1.7*   No results for input(s): LIPASE, AMYLASE in the last 168 hours. No results for input(s): AMMONIA in the last 168 hours. Coagulation Profile: No results for input(s): INR, PROTIME in the last 168 hours. Cardiac Enzymes: No results for input(s): CKTOTAL, CKMB, CKMBINDEX, TROPONINI in the  last 168 hours. BNP (last 3 results) No results for input(s): PROBNP in the last 8760 hours. HbA1C: No results for input(s): HGBA1C in the last 72 hours. CBG: Recent Labs  Lab 03/12/20 1707 03/12/20 2033 03/13/20 0744 03/13/20 1148 03/13/20 1641  GLUCAP 249* 325* 269* 245* 217*   Lipid Profile: No results for input(s): CHOL, HDL, LDLCALC, TRIG, CHOLHDL, LDLDIRECT in the last 72 hours. Thyroid Function Tests: No results for input(s): TSH, T4TOTAL, FREET4, T3FREE, THYROIDAB in the last 72 hours. Anemia Panel: No results for input(s): VITAMINB12, FOLATE, FERRITIN, TIBC, IRON, RETICCTPCT in the last 72 hours. Sepsis Labs: No results for input(s): PROCALCITON, LATICACIDVEN in the last 168 hours.  Recent Results (from the past 240 hour(s))  SARS Coronavirus 2 by RT PCR (hospital order, performed in [facility] hospital lab) Nasopharyngeal Nasopharyngeal Swab     Status: None   Collection Time: 03/04/20  5:39 PM   Specimen: Nasopharyngeal Swab  Result Value Ref Range Status   SARS Coronavirus 2 NEGATIVE NEGATIVE Final    Comment: (NOTE) SARS-CoV-2 target nucleic acids are NOT DETECTED. The SARS-CoV-2 RNA is generally detectable in upper and lower respiratory specimens during the acute phase of infection. The lowest concentration of SARS-CoV-2 viral copies this assay can detect is 250 copies / mL. A negative result does not preclude SARS-CoV-2 infection and should not be used as the sole basis for treatment or other patient management decisions.  A negative result may occur with improper specimen collection / handling, submission of specimen other than nasopharyngeal swab, presence of viral mutation(s) within the areas targeted by this assay, and inadequate number of viral copies (<250 copies / mL). A negative result must be combined with clinical observations, patient history, and epidemiological information. Fact Sheet for Patients:    StrictlyIdeas.no Fact Sheet for Healthcare Providers: BankingDealers.co.za This test is not yet approved or cleared  by the [place] FDA and has been authorized for detection and/or diagnosis of SARS-CoV-2 by FDA under an Emergency Use Authorization (EUA).  This EUA will remain in effect (meaning this test can be used) for the duration of the COVID-19 declaration under Section 564(b)(1) of the Act, 21 U.S.C. section 360bbb-3(b)(1), unless the authorization is terminated or revoked sooner. Performed at [facility], [address] [place], [place] [address]   Culture, Urine     Status: None   Collection Time: 03/10/20 11:03 AM   Specimen: Urine, Clean Catch  Result Value Ref Range Status   Specimen Description   Final    URINE, CLEAN CATCH Performed at [facility], [place] [address], [place], [place] [address]    Special Requests   Final    NONE Performed at [facility], [place] [address], [place], [place] [address]    Culture   Final  NO GROWTH Performed at [facility], [place] [address], [place], [place] [address]    Report Status 03/11/2020 FINAL  Final         Radiology Studies: No results found.      Scheduled Meds: . apixaban  5 mg Oral BID  . atorvastatin  80 mg Oral q1800  . chlorhexidine  15 mL Mouth Rinse BID  . feeding supplement (NEPRO CARB STEADY)  237 mL Oral BID BM  . finasteride  5 mg Oral Daily  . insulin aspart  0-15 Units Subcutaneous TID WC  . insulin aspart  0-5 Units Subcutaneous QHS  . [START ON 03/14/2020] insulin glargine  30 Units Subcutaneous Daily  . mouth rinse  15 mL Mouth Rinse q12n4p  . megestrol  400 mg Oral BID  . metoprolol tartrate  12.5 mg Oral BID  . mirtazapine  15 mg Oral QHS  . mycophenolate  360 mg Oral BID  . nystatin  5 mL Oral QID  . predniSONE  10 mg Oral Q breakfast  . sodium bicarbonate  1,300 mg Oral TID  .  sodium chloride flush  3 mL Intravenous Q12H  . tacrolimus  1 mg Oral BID  . tamsulosin  0.4 mg Oral QPC breakfast   Continuous Infusions: . sodium chloride 250 mL (03/05/20 1710)  . sodium chloride 75 mL/hr at 03/13/20 0848     LOS: 9 days     Berle Mull , MD Pager on [facility] 03/13/2020, 5:59 PM

## 2020-03-13 NOTE — Progress Notes (Signed)
Patient total urine output for the day is 100cc's.  Bladder scanner showing 250cc's.  MD made aware.  Per MD monitor output and in and out cath is bladder scanner shows <400cc's.

## 2020-03-13 NOTE — Progress Notes (Signed)
Occupational Therapy Treatment Patient Details Name: Ryan Rice MRN: [id number] DOB: Jul 16, 1951 Today's Date: 03/13/2020    History of present illness 69 y.o. male with medical history significant for ESRD s/p renal transplant on immunosuppressants,  hospitalized 12/29/19-01/10/20 for CVA with residual expressive aphasia, atrial fibrillation on Eliquis, type 2 diabetes, hypertension, hyperlipidemia, PVD, and BPH who is admitted with acute renal failure.   OT comments  Patient requiring max cues to initiate task with limited carry over of directions regarding body mechanics and sequencing for bed mobility and sit to stand at EOB. Attempted x2 to stand from elevated bed height with max A x1 however patient with limited strength to push through L UE and B LEs to assist. Require mod A x2 to complete stand pivot to chair.    Follow Up Recommendations  SNF;Supervision/Assistance - 24 hour    Equipment Recommendations  Other (comment) (TBD)       Precautions / Restrictions Precautions Precautions: Fall Precaution Comments: aphasia Restrictions Weight Bearing Restrictions: No       Mobility Bed Mobility Overal bed mobility: Needs Assistance Bed Mobility: Supine to Sit     Supine to sit: Mod assist;Max assist;HOB elevated     General bed mobility comments: max cues for sequencing with limited carry over, assist to mobilize L LE and trunk  Transfers Overall transfer level: Needs assistance Equipment used: None Transfers: Stand Pivot Transfers;Sit to/from Stand Sit to Stand: Mod assist;+2 physical assistance;+2 safety/equipment;From elevated surface Stand pivot transfers: Mod assist;+2 physical assistance;+2 safety/equipment;From elevated surface       General transfer comment: attempt sit to stand on two attempts with x1 max A, pt minimally uses L UE to assist and poor carry over of cues for body mechanics. require mod A x2 to safely complete stand pivot to chair    Balance  Overall balance assessment: Needs assistance Sitting-balance support: Bilateral upper extremity supported Sitting balance-Leahy Scale: Fair Sitting balance - Comments: R lateral lean with max cues to correct   Standing balance support: Bilateral upper extremity supported Standing balance-Leahy Scale: Poor Standing balance comment: relies on external support                           ADL either performed or assessed with clinical judgement   ADL Overall ADL's : Needs assistance/impaired                         Toilet Transfer: +2 for physical assistance;+2 for safety/equipment;Moderate assistance;Stand-pivot;BSC Toilet Transfer Details (indicate cue type and reason): simulated to recliner, x2 side by side assist max cues for body mechanics         Functional mobility during ADLs: Moderate assistance;+2 for physical assistance;+2 for safety/equipment                 Cognition Arousal/Alertness: Awake/alert Behavior During Therapy: Flat affect Overall Cognitive Status: No family/caregiver present to determine baseline cognitive functioning                                 General Comments: poor task initiation and following 1 step directions                   Pertinent Vitals/ Pain       Pain Assessment: Faces Faces Pain Scale: Hurts even more Pain Location: LEs when touch, especially L foot Pain Descriptors /  Indicators: Sore;Discomfort;Grimacing Pain Intervention(s): Monitored during session         Frequency  Min 2X/week        Progress Toward Goals  OT Goals(current goals can now be found in the care plan section)  Progress towards OT goals: Progressing toward goals  Acute Rehab OT Goals Patient Stated Goal: agreeable to chair OT Goal Formulation: With patient Time For Goal Achievement: 03/19/20 Potential to Achieve Goals: Good ADL Goals Pt Will Perform Upper Body Dressing: with set-up;sitting Pt Will Perform  Lower Body Dressing: sit to/from stand;sitting/lateral leans;with min assist Pt Will Transfer to Toilet: bedside commode;ambulating;with min assist Pt Will Perform Toileting - Clothing Manipulation and hygiene: sit to/from stand;sitting/lateral leans;with min assist  Plan Discharge plan remains appropriate       AM-PAC OT "6 Clicks" Daily Activity     Outcome Measure   Help from another person eating meals?: A Cater Help from another person taking care of personal grooming?: A [place] Help from another person toileting, which includes using toliet, bedpan, or urinal?: Total Help from another person bathing (including washing, rinsing, drying)?: A Lot Help from another person to put on and taking off regular upper body clothing?: A Nine Help from another person to put on and taking off regular lower body clothing?: Total 6 Click Score: 13    End of Session  OT Visit Diagnosis: Unsteadiness on feet (R26.81);Other abnormalities of gait and mobility (R26.89);Muscle weakness (generalized) (M62.81);Other symptoms and signs involving cognitive function   Activity Tolerance Patient tolerated treatment well   Patient Left in chair;with call bell/phone within reach;with family/visitor present;with chair alarm set   Nurse Communication Mobility status        Time: 0233-4356 OT Time Calculation (min): 20 min  Charges: OT General Charges $OT Visit: 1 Visit OT Treatments $Self Care/Home Management : 8-22 mins  Delbert Phenix OT Pager: [place] 03/13/2020, 12:07 PM

## 2020-03-14 LAB — GLUCOSE, CAPILLARY
Glucose-Capillary: 190 mg/dL — ABNORMAL HIGH (ref 70–99)
Glucose-Capillary: 167 mg/dL — ABNORMAL HIGH (ref 70–99)
Glucose-Capillary: 260 mg/dL — ABNORMAL HIGH (ref 70–99)
Glucose-Capillary: 206 mg/dL — ABNORMAL HIGH (ref 70–99)

## 2020-03-14 LAB — RENAL FUNCTION PANEL
Albumin: 1.6 g/dL — ABNORMAL LOW (ref 3.5–5.0)
Anion gap: 9 (ref 5–15)
BUN: 59 mg/dL — ABNORMAL HIGH (ref 8–23)
CO2: 20 mmol/L — ABNORMAL LOW (ref 22–32)
Calcium: 7.9 mg/dL — ABNORMAL LOW (ref 8.9–10.3)
Chloride: 102 mmol/L (ref 98–111)
Creatinine, Ser: 2.63 mg/dL — ABNORMAL HIGH (ref 0.61–1.24)
GFR calc Af Amer: 28 mL/min — ABNORMAL LOW (ref >60)
GFR calc non Af Amer: 24 mL/min — ABNORMAL LOW (ref >60)
Glucose, Bld: 212 mg/dL — ABNORMAL HIGH (ref 70–99)
Phosphorus: 2.8 mg/dL (ref 2.5–4.6)
Potassium: 3.2 mmol/L — ABNORMAL LOW (ref 3.5–5.1)
Sodium: 131 mmol/L — ABNORMAL LOW (ref 135–145)

## 2020-03-14 LAB — CBC
HCT: 26.6 % — ABNORMAL LOW (ref 39.0–52.0)
Hemoglobin: 8.4 g/dL — ABNORMAL LOW (ref 13.0–17.0)
MCH: 24.8 pg — ABNORMAL LOW (ref 26.0–34.0)
MCHC: 31.6 g/dL (ref 30.0–36.0)
MCV: 78.5 fL — ABNORMAL LOW (ref 80.0–100.0)
Platelets: 145 10*3/uL — ABNORMAL LOW (ref 150–400)
RBC: 3.39 MIL/uL — ABNORMAL LOW (ref 4.22–5.81)
RDW: 16.9 % — ABNORMAL HIGH (ref 11.5–15.5)
WBC: 8.4 10*3/uL (ref 4.0–10.5)
nRBC: 0 % (ref 0.0–0.2)

## 2020-03-14 LAB — MAGNESIUM: Magnesium: 1.9 mg/dL (ref 1.7–2.4)

## 2020-03-14 MED ORDER — POLYETHYLENE GLYCOL 3350 17 G PO PACK
17.0000 g | PACK | Freq: Every day | ORAL | Status: DC
  Administered 2020-03-14 – 2020-03-18 (×5): 17 g via ORAL
  Filled 2020-03-14 (×5): qty 1

## 2020-03-14 MED ORDER — POTASSIUM CHLORIDE CRYS ER 20 MEQ PO TBCR
40.0000 meq | EXTENDED_RELEASE_TABLET | Freq: Once | ORAL | Status: AC
  Administered 2020-03-14: 40 meq via ORAL
  Filled 2020-03-14: qty 2

## 2020-03-14 MED ORDER — DOCUSATE SODIUM 100 MG PO CAPS
100.0000 mg | ORAL_CAPSULE | Freq: Two times a day (BID) | ORAL | Status: DC
  Administered 2020-03-14 – 2020-03-18 (×9): 100 mg via ORAL
  Filled 2020-03-14 (×9): qty 1

## 2020-03-14 NOTE — Progress Notes (Signed)
PROGRESS NOTE    Ryan Rice  TAV:[id number] DOB: January 17, 1951 DOA: 03/04/2020 PCP: Mina Marble, PA-C    Brief Narrative:Patient is a 69 year old male with history of ESRD status post left renal transplant, history of CVA with residual expressive aphasia, proximal A. fib on Eliquis, diabetes mellitustype 2,hypertension, hyperlipidemia, peripheral vascular disease, BPH who presents for the evaluation of low blood pressure, decreased oral intake, epigastric pain. He reported poor oral intake for a week. He also reported decreased urinary output couple of days ago. Also complained of diarrhea. On presentation he was hemodynamically stable. He was found to have AKI with creatinine of 2.9. His baseline creatinine is normal. Started on IV fluids, now stopped. Nephrology also consulted.Physical therapy evaluated him and recommended skilled nursing facility on discharge.  Assessment & Plan: AKI on a renal transplant patient:  Baseline creatinine normal.  His creatinine was 1.01 on 01/24/2020.  Presented with creatinine of 2.9.  Most likely prerenal AKI associatedwith decreased oral intake. Nephrology consulted appreciate assistance. Renal function worsening again. Suspect secondary to Diflucan. Nephrology resumed IV fluid. Monitor renal function.  ESRD status post renal transplant:  Follows with nephrology at [facility].  On Prograf, Myfortic, prednisone.   Tacrolimus level high.  Likely due to Diflucan. Continue further medications per nephrology.  Odynophagia/dysphagia: Followed by speech.  Recommended dysphagia 3 diet.  High suspicion for candidal esophagitis due to his immunocompromised status.He was found to have oral candidiasis. Started on fluconazole.  Change to nystatin due to concern with Prograf. Continue for 14 days course.  Persistent A. fib:  Currently rate is controlled.  On Eliquis 5 mg twice a day for  anticoagulation.  Debility/deconditioning/generalized weakness/history of CVA: Has residual expressive aphasia and gait imbalance.  He is confused at baseline after the stroke as per his wife. He was admitted in April 2021 and was found to have evolving subacute left ACA territory infarct with petechial hemorrhage.  On Eliquis and Lipitor at home.  PT/OT recommend skilled nursingfacility. He has home health and follows with PT,OT, Speech.Wife is interested on taking him home with home health  Hypertension:  Fluctuating blood pressure.   Continue current medications.  Hyperlipidemia:  Continue Lipitor  BPH: On Flomax and Proscar.  Diabetes type 2, uncontrolled with hyper and hypoglycemia with long-term renal failure Blood glucose running on the higher side.   On insulin and oral medications at home.   Continue to monitor Accu-Cheks and adjust insulin as needed.  Diarrhea: Now rather have constipation Continue bowel regimen.  Depression. Decreased appetite patient is not eating enough to be able to safely discharge him home.  Started Remeron.  Dose increased. Also include Megace.   Patient is on Eliquis which should help to minimize DVT risk. Tolerating increased dose of the Remeron.  Will monitor.  Pressure ulcer on sacrum unstageable.  POA. Continue foam dressing.  Pressure Injury 03/05/20 Sacrum Unstageable - Full thickness tissue loss in which the base of the injury is covered by slough (yellow, tan, gray, green or brown) and/or eschar (tan, brown or black) in the wound bed. Slough filled ulcer (Active)  03/05/20 1121  Location: Sacrum  Location Orientation:   Staging: Unstageable - Full thickness tissue loss in which the base of the injury is covered by slough (yellow, tan, gray, green or brown) and/or eschar (tan, brown or black) in the wound bed.  Wound Description (Comments): Slough filled ulcer  Present on Admission: Yes    Estimated body mass index is  23.55 kg/m as  calculated from the following:   Height as of this encounter: 5\' 11"  (1.803 m).   Weight as of this encounter: 76.6 kg.  DVT prophylaxis:Eliquis Code Status:Full Family Communication  discussed with wife on the phone.    Status is: Inpatient  Remains inpatient appropriate because:IV treatments appropriate due to intensity of illness or inability to take PO   Dispo: The patient is from: Home Anticipated d/c is UX:LKGM Anticipated d/c date is: 1 to 2 days provided creatinine improves. Patient currently is not medically stable to d/c.    Consultants:Nephrology  Procedures:None  Antimicrobials: Subjective: No nausea no vomiting.  No fever no chills.  No chest pain abdomen pain.  No diarrhea.  Objective: Vitals:   03/14/20 0556 03/14/20 1000 03/14/20 1050 03/14/20 1341  BP: (!) 147/74  140/77 113/69  Pulse: 89  94 82  Resp: 16   14  Temp: 97.8 F (36.6 C)   98.2 F (36.8 C)  TempSrc: Oral   Oral  SpO2: 100%   100%  Weight:  76.6 kg    Height:        Intake/Output Summary (Last 24 hours) at 03/14/2020 1859 Last data filed at 03/14/2020 1800 Gross per 24 hour  Intake 2786.67 ml  Output 200 ml  Net 2586.67 ml   Filed Weights   03/10/20 0500 03/11/20 0500 03/14/20 1000  Weight: 75.1 kg 75 kg 76.6 kg    Examination:  General: Appear in mild distress, no Rash; Oral Mucosa Clear, moist. no Abnormal Neck Mass Or lumps, Conjunctiva normal  Cardiovascular: S1 and S2 Present, no Murmur, Respiratory: good respiratory effort, Bilateral Air entry present and Clear to Auscultation, no Crackles, no wheezes Abdomen: Bowel Sound present, Soft and no tenderness Extremities: no Pedal edema, no calf tenderness Neurology: alert and oriented to place and person affect flat in affect. no new focal deficit Gait not checked due to patient safety concerns     Data Reviewed: I have personally reviewed following labs  and imaging studies  CBC: Recent Labs  Lab 03/10/20 0531 03/11/20 0438 03/13/20 0533 03/14/20 0622  WBC 9.0 9.9 8.3 8.4  HGB 9.7* 10.1* 9.7* 8.4*  HCT 30.9* 30.8* 30.1* 26.6*  MCV 80.1 77.2* 79.0* 78.5*  PLT 121* 129* 148* 010*   Basic Metabolic Panel: Recent Labs  Lab 03/08/20 0943 03/08/20 0943 03/09/20 0512 03/09/20 0512 03/10/20 0531 03/11/20 0438 03/12/20 0520 03/13/20 0533 03/14/20 0622  NA 134*   < > 130*   < > 131* 130* 129* 132* 131*  K 3.8   < > 4.2   < > 4.2 5.6* 4.0 3.8 3.2*  CL 109   < > 104   < > 103 104 99 102 102  CO2 15*   < > 15*   < > 18* 16* 19* 21* 20*  GLUCOSE 283*   < > 247*   < > 264* 265* 261* 297* 212*  BUN 59*   < > 55*   < > 61* 56* 66* 61* 59*  CREATININE 2.11*   < > 2.32*   < > 2.34* 1.90* 2.57* 2.60* 2.63*  CALCIUM 8.5*   < > 8.3*   < > 8.6* 8.3* 8.4* 8.3* 7.9*  MG  --   --   --   --   --   --   --  1.3* 1.9  PHOS 2.1*  --  2.3*  --   --   --   --  2.7 2.8   < > =  values in this interval not displayed.   GFR: Estimated Creatinine Clearance: 28.2 mL/min (A) (by C-G formula based on SCr of 2.63 mg/dL (H)). Liver Function Tests: Recent Labs  Lab 03/08/20 0943 03/09/20 0512 03/13/20 0533 03/14/20 0622  ALBUMIN 1.8* 1.8* 1.7* 1.6*   No results for input(s): LIPASE, AMYLASE in the last 168 hours. No results for input(s): AMMONIA in the last 168 hours. Coagulation Profile: No results for input(s): INR, PROTIME in the last 168 hours. Cardiac Enzymes: No results for input(s): CKTOTAL, CKMB, CKMBINDEX, TROPONINI in the last 168 hours. BNP (last 3 results) No results for input(s): PROBNP in the last 8760 hours. HbA1C: No results for input(s): HGBA1C in the last 72 hours. CBG: Recent Labs  Lab 03/13/20 1641 03/13/20 2102 03/14/20 0745 03/14/20 1134 03/14/20 1641  GLUCAP 217* 256* 190* 167* 260*   Lipid Profile: No results for input(s): CHOL, HDL, LDLCALC, TRIG, CHOLHDL, LDLDIRECT in the last 72 hours. Thyroid Function  Tests: No results for input(s): TSH, T4TOTAL, FREET4, T3FREE, THYROIDAB in the last 72 hours. Anemia Panel: No results for input(s): VITAMINB12, FOLATE, FERRITIN, TIBC, IRON, RETICCTPCT in the last 72 hours. Sepsis Labs: No results for input(s): PROCALCITON, LATICACIDVEN in the last 168 hours.  Recent Results (from the past 240 hour(s))  Culture, Urine     Status: None   Collection Time: 03/10/20 11:03 AM   Specimen: Urine, Clean Catch  Result Value Ref Range Status   Specimen Description   Final    URINE, CLEAN CATCH Performed at [facility], [place] [address], [place], [place] [address]    Special Requests   Final    NONE Performed at [facility], [place] [address] [place], [place] [address]    Culture   Final    NO GROWTH Performed at [facility], [place] [address], [place], [place] [address]    Report Status 03/11/2020 FINAL  Final         Radiology Studies: No results found.      Scheduled Meds: . apixaban  5 mg Oral BID  . atorvastatin  80 mg Oral q1800  . chlorhexidine  15 mL Mouth Rinse BID  . docusate sodium  100 mg Oral BID  . feeding supplement (NEPRO CARB STEADY)  237 mL Oral BID BM  . finasteride  5 mg Oral Daily  . insulin aspart  0-15 Units Subcutaneous TID WC  . insulin aspart  0-5 Units Subcutaneous QHS  . insulin glargine  30 Units Subcutaneous Daily  . mouth rinse  15 mL Mouth Rinse q12n4p  . megestrol  400 mg Oral BID  . metoprolol tartrate  12.5 mg Oral BID  . mirtazapine  15 mg Oral QHS  . mycophenolate  360 mg Oral BID  . nystatin  5 mL Oral QID  . polyethylene glycol  17 g Oral Daily  . predniSONE  10 mg Oral Q breakfast  . sodium bicarbonate  1,300 mg Oral TID  . sodium chloride flush  3 mL Intravenous Q12H  . tacrolimus  1 mg Oral BID  . tamsulosin  0.4 mg Oral QPC breakfast   Continuous Infusions: . sodium chloride 250 mL (03/05/20 1710)  . sodium chloride 75 mL/hr at 03/14/20 1512      LOS: 10 days     Berle Mull , MD Pager on [facility] 03/14/2020, 6:59 PM

## 2020-03-14 NOTE — Progress Notes (Signed)
PT Cancellation Note  Patient Details Name: Ryan Rice MRN: [id number] DOB: Nov 09, 1950   Cancelled Treatment:    Reason Eval/Treat Not Completed: Patient declined, no reason specified (pt requested PT try another time. Will follow.)  Philomena Doheny PT 03/14/2020  Acute Rehabilitation Services Pager [phone] Office [phone]

## 2020-03-14 NOTE — Progress Notes (Signed)
[facility] Progress Note  Subjective: UOP 200- 300 cc recorded yest. Has condom cath.   Vitals:   03/14/20 0556 03/14/20 1000 03/14/20 1050 03/14/20 1341  BP: (!) 147/74  140/77 113/69  Pulse: 89  94 82  Resp: 16   14  Temp: 97.8 F (36.6 C)   98.2 F (36.8 C)  TempSrc: Oral   Oral  SpO2: 100%   100%  Weight:  76.6 kg    Height:        Exam: General: stable, seen in room, lying flat, no distress Heart: RRR, no rub Lungs: clear ant Abdomen: soft, nontender, RLQ renal allograft nontender Extremities: no edema Dialysis Access: LUE +large aneurysmal AVF with minimal thrill and bruit but pulsatile GU: condom cath with clear yellow urine in bag   Renal Tx [place] 6/10 > IMPRESSION: 1. Stable right transplant kidney.  No hydronephrosis. 2. Prostate enlargement    UA 6/15 - cloudy, yellow,++ LE/ neg nitrite,+ Hb, 11-20 rbc/ >50 wbc/++ bacteria    CT abd / pelvis on 6/09 - ...a transplant kidney is seen within the right renal pelvis. This is normal in size without evidence of focal lesions, hydronephrosis or renal calculi. Bladder is unremarkable.    Assessment/ Plan: 1. 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 Recent baseline renal function 1-1.5 during 12/2019 admission, now with AKI cr 2.9 which was most likely hypovolemia in setting of V/D/poor po intake + tacrolimus toxicity.  FeNa initially low.  Tac level returned at 14.5 form 6/11 which is high.  We have lowered prograf 2mg  bid  > 1mg  bid. Started IVF"s on 6/17, no volume overload on exam and wt's stable. Not taking in much by mouth. Creat stable today. Repeating tac level today (send-out).  2. Transplant immunuosuppression: Cont myfortic, pred. See above for tacrolimus.   3. Chronic atrial fib: on eliquis and metoprolol 12.5 bid, heart rate 80-90's.  4. Hematuria/pyuria:  has outpt urologist who can follow up. Urine not bloody. Looks dark today.   Repeat  UA showing lots of bacteria/ WBC"s and some RBC's >> however, urine culture back today and is negative, thankfully (had pseudomonas UTI in April 2021). 5. Hyperkalemia: changed to renal diet and getting qd Lokelma. K+ down 4.0 today 6. Metabolic acidosis: multifact - started po bicarb 1300 TID. CO2 19 today 7. Dysphagia: Being treated with fluconazole for thrush which has resolved on exam. Diflucan was stopped 6/15 due to possibility of worsening prograf toxicity,  and switched to nystatin S/S  Pt is swallowing better but not eating a lot per the primary team.  8. Diarrhea, abd pain: Per primary. Appears to have resolved.  9. BP: bp's are on the low side, getting metoprolol for afib only at 12.5 bid. Starting IVF back 10. Malnutrition: albumin < 2, on supplements, hospitalist added remeron  11. H/o CVA - chronic debility, appears bed bound    Rob Ludell Zacarias 03/14/2020, 7:27 PM   Recent Labs  Lab 03/13/20 0533 03/14/20 0622  K 3.8 3.2*  BUN 61* 59*  CREATININE 2.60* 2.63*  CALCIUM 8.3* 7.9*  PHOS 2.7 2.8  HGB 9.7* 8.4*   Inpatient medications: . apixaban  5 mg Oral BID  . atorvastatin  80 mg Oral q1800  . chlorhexidine  15 mL Mouth Rinse BID  . docusate sodium  100 mg Oral BID  . feeding supplement (NEPRO CARB STEADY)  237 mL Oral BID BM  .  finasteride  5 mg Oral Daily  . insulin aspart  0-15 Units Subcutaneous TID WC  . insulin aspart  0-5 Units Subcutaneous QHS  . insulin glargine  30 Units Subcutaneous Daily  . mouth rinse  15 mL Mouth Rinse q12n4p  . megestrol  400 mg Oral BID  . metoprolol tartrate  12.5 mg Oral BID  . mirtazapine  15 mg Oral QHS  . mycophenolate  360 mg Oral BID  . nystatin  5 mL Oral QID  . polyethylene glycol  17 g Oral Daily  . predniSONE  10 mg Oral Q breakfast  . sodium bicarbonate  1,300 mg Oral TID  . sodium chloride flush  3 mL Intravenous Q12H  . tacrolimus  1 mg Oral BID  . tamsulosin  0.4 mg Oral QPC breakfast   . sodium chloride 250 mL  (03/05/20 1710)  . sodium chloride 75 mL/hr at 03/14/20 1512   sodium chloride, acetaminophen **OR** acetaminophen, liver oil-zinc oxide, ondansetron **OR** ondansetron (ZOFRAN) IV

## 2020-03-15 LAB — CBC
HCT: 27.9 % — ABNORMAL LOW (ref 39.0–52.0)
Hemoglobin: 8.8 g/dL — ABNORMAL LOW (ref 13.0–17.0)
MCH: 25.1 pg — ABNORMAL LOW (ref 26.0–34.0)
MCHC: 31.5 g/dL (ref 30.0–36.0)
MCV: 79.7 fL — ABNORMAL LOW (ref 80.0–100.0)
Platelets: 144 10*3/uL — ABNORMAL LOW (ref 150–400)
RBC: 3.5 MIL/uL — ABNORMAL LOW (ref 4.22–5.81)
RDW: 17.2 % — ABNORMAL HIGH (ref 11.5–15.5)
WBC: 8.4 10*3/uL (ref 4.0–10.5)
nRBC: 0 % (ref 0.0–0.2)

## 2020-03-15 LAB — RENAL FUNCTION PANEL
Albumin: 1.6 g/dL — ABNORMAL LOW (ref 3.5–5.0)
Anion gap: 5 (ref 5–15)
BUN: 58 mg/dL — ABNORMAL HIGH (ref 8–23)
CO2: 20 mmol/L — ABNORMAL LOW (ref 22–32)
Calcium: 7.9 mg/dL — ABNORMAL LOW (ref 8.9–10.3)
Chloride: 106 mmol/L (ref 98–111)
Creatinine, Ser: 2.55 mg/dL — ABNORMAL HIGH (ref 0.61–1.24)
GFR calc Af Amer: 29 mL/min — ABNORMAL LOW (ref >60)
GFR calc non Af Amer: 25 mL/min — ABNORMAL LOW (ref >60)
Glucose, Bld: 229 mg/dL — ABNORMAL HIGH (ref 70–99)
Phosphorus: 2.6 mg/dL (ref 2.5–4.6)
Potassium: 3.7 mmol/L (ref 3.5–5.1)
Sodium: 131 mmol/L — ABNORMAL LOW (ref 135–145)

## 2020-03-15 LAB — GLUCOSE, CAPILLARY
Glucose-Capillary: 181 mg/dL — ABNORMAL HIGH (ref 70–99)
Glucose-Capillary: 232 mg/dL — ABNORMAL HIGH (ref 70–99)
Glucose-Capillary: 344 mg/dL — ABNORMAL HIGH (ref 70–99)
Glucose-Capillary: 283 mg/dL — ABNORMAL HIGH (ref 70–99)
Glucose-Capillary: 292 mg/dL — ABNORMAL HIGH (ref 70–99)

## 2020-03-15 MED ORDER — INSULIN ASPART 100 UNIT/ML ~~LOC~~ SOLN
3.0000 [IU] | Freq: Three times a day (TID) | SUBCUTANEOUS | Status: DC
  Administered 2020-03-16 – 2020-03-17 (×3): 3 [IU] via SUBCUTANEOUS

## 2020-03-15 MED ORDER — INSULIN GLARGINE 100 UNIT/ML ~~LOC~~ SOLN
35.0000 [IU] | Freq: Every day | SUBCUTANEOUS | Status: DC
  Administered 2020-03-16 – 2020-03-18 (×3): 35 [IU] via SUBCUTANEOUS
  Filled 2020-03-15 (×3): qty 0.35

## 2020-03-15 MED ORDER — SODIUM BICARBONATE 650 MG PO TABS
1300.0000 mg | ORAL_TABLET | Freq: Two times a day (BID) | ORAL | Status: DC
  Administered 2020-03-15 – 2020-03-18 (×6): 1300 mg via ORAL
  Filled 2020-03-15 (×6): qty 2

## 2020-03-15 NOTE — Progress Notes (Signed)
PROGRESS NOTE    Ryan Rice  FYB:[id number] DOB: 09/20/1951 DOA: 03/04/2020 PCP: Mina Marble, PA-C    Brief Narrative:Patient is a 69 year old male with history of ESRD status post left renal transplant, history of CVA with residual expressive aphasia, proximal A. fib on Eliquis, diabetes mellitustype 2,hypertension, hyperlipidemia, peripheral vascular disease, BPH who presents for the evaluation of low blood pressure, decreased oral intake, epigastric pain. He reported poor oral intake for a week. He also reported decreased urinary output couple of days ago. Also complained of diarrhea. On presentation he was hemodynamically stable. He was found to have AKI with creatinine of 2.9. His baseline creatinine is normal. Started on IV fluids, now stopped. Nephrology also consulted.Physical therapy evaluated him and recommended skilled nursing facility on discharge.  Assessment & Plan: AKI on a renal transplant patient:  Baseline creatinine normal.  His creatinine was 1.01 on 01/24/2020.  Presented with creatinine of 2.9.  Most likely prerenal AKI associatedwith decreased oral intake. Nephrology consulted appreciate assistance. Renal function worsening again. Suspect secondary to Diflucan. Nephrology resumed IV fluid. Monitor renal function.  ESRD status post renal transplant:  Follows with nephrology at [facility].  On Prograf, Myfortic, prednisone.   Tacrolimus level high.  Likely due to Diflucan. Continue further medications per nephrology.  Odynophagia/dysphagia: Followed by speech.  Recommended dysphagia 3 diet.  High suspicion for candidal esophagitis due to his immunocompromised status.He was found to have oral candidiasis. Started on fluconazole.  Change to nystatin due to concern with Prograf. Continue for 14 days course.  Persistent A. fib:  Currently rate is controlled.  On Eliquis 5 mg twice a day for  anticoagulation.  Debility/deconditioning/generalized weakness/history of CVA: Has residual expressive aphasia and gait imbalance.  He is confused at baseline after the stroke as per his wife. He was admitted in April 2021 and was found to have evolving subacute left ACA territory infarct with petechial hemorrhage.  On Eliquis and Lipitor at home.  PT/OT recommend skilled nursingfacility. He has home health and follows with PT,OT, Speech.Wife is interested on taking him home with home health  Hypertension:  Fluctuating blood pressure.   Continue current medications.  Hyperlipidemia:  Continue Lipitor  BPH: On Flomax and Proscar.  Diabetes type 2, uncontrolled with hyper and hypoglycemia with long-term renal failure Blood glucose running on the higher side.   On insulin and oral medications at home.   Continue to monitor Accu-Cheks and adjust insulin as needed.  Diarrhea: Now rather have constipation Continue bowel regimen.  Depression. Decreased appetite patient is not eating enough to be able to safely discharge him home.  Started Remeron.  Dose increased. Also include Megace.   Patient is on Eliquis which should help to minimize DVT risk. Tolerating increased dose of the Remeron.  Will monitor.  Pressure ulcer on sacrum unstageable.  POA. Continue foam dressing.  Pressure Injury 03/05/20 Sacrum Unstageable - Full thickness tissue loss in which the base of the injury is covered by slough (yellow, tan, gray, green or brown) and/or eschar (tan, brown or black) in the wound bed. Slough filled ulcer (Active)  03/05/20 1121  Location: Sacrum  Location Orientation:   Staging: Unstageable - Full thickness tissue loss in which the base of the injury is covered by slough (yellow, tan, gray, green or brown) and/or eschar (tan, brown or black) in the wound bed.  Wound Description (Comments): Slough filled ulcer  Present on Admission: Yes    Estimated body mass index is  23.68 kg/m as  calculated from the following:   Height as of this encounter: 5\' 11"  (1.803 m).   Weight as of this encounter: 77 kg.  DVT prophylaxis:Eliquis Code Status:Full Family Communication  discussed with wife on the phone.    Status is: Inpatient  Remains inpatient appropriate because:IV treatments appropriate due to intensity of illness or inability to take PO   Dispo: The patient is from: Home Anticipated d/c is TO:IZTI Anticipated d/c date is: 1 to 2 days provided creatinine improves. Patient currently is not medically stable to d/c.    Consultants:Nephrology  Procedures:None  Antimicrobials: Subjective: No nausea no vomiting.  Oral intake improving.  No fever no chills.  Still reports constipation.  Objective: Vitals:   03/15/20 0500 03/15/20 0602 03/15/20 1158 03/15/20 1500  BP:  (!) 138/99  111/66  Pulse:  89  82  Resp:  17  18  Temp:  97.9 F (36.6 C)  98.2 F (36.8 C)  TempSrc:  Oral    SpO2:  98%  96%  Weight: 80 kg  77 kg   Height:        Intake/Output Summary (Last 24 hours) at 03/15/2020 1854 Last data filed at 03/15/2020 1843 Gross per 24 hour  Intake 2104.1 ml  Output 350 ml  Net 1754.1 ml   Filed Weights   03/14/20 1000 03/15/20 0500 03/15/20 1158  Weight: 76.6 kg 80 kg 77 kg    Examination:  General: Appear in mild distress, no Rash; Oral Mucosa Clear, moist. no Abnormal Neck Mass Or lumps, Conjunctiva normal  Cardiovascular: S1 and S2 Present, no Murmur, Respiratory: good respiratory effort, Bilateral Air entry present and Clear to Auscultation, no Crackles, no wheezes Abdomen: Bowel Sound present, Soft and no tenderness Extremities: no Pedal edema, no calf tenderness Neurology: alert and oriented to place and person affect flat in affect. no new focal deficit Gait not checked due to patient safety concerns     Data Reviewed: I have personally reviewed following labs and  imaging studies  CBC: Recent Labs  Lab 03/10/20 0531 03/11/20 0438 03/13/20 0533 03/14/20 0622 03/15/20 0633  WBC 9.0 9.9 8.3 8.4 8.4  HGB 9.7* 10.1* 9.7* 8.4* 8.8*  HCT 30.9* 30.8* 30.1* 26.6* 27.9*  MCV 80.1 77.2* 79.0* 78.5* 79.7*  PLT 121* 129* 148* 145* 458*   Basic Metabolic Panel: Recent Labs  Lab 03/09/20 0512 03/10/20 0531 03/11/20 0438 03/12/20 0520 03/13/20 0533 03/14/20 0622 03/15/20 0633  NA 130*   < > 130* 129* 132* 131* 131*  K 4.2   < > 5.6* 4.0 3.8 3.2* 3.7  CL 104   < > 104 99 102 102 106  CO2 15*   < > 16* 19* 21* 20* 20*  GLUCOSE 247*   < > 265* 261* 297* 212* 229*  BUN 55*   < > 56* 66* 61* 59* 58*  CREATININE 2.32*   < > 1.90* 2.57* 2.60* 2.63* 2.55*  CALCIUM 8.3*   < > 8.3* 8.4* 8.3* 7.9* 7.9*  MG  --   --   --   --  1.3* 1.9  --   PHOS 2.3*  --   --   --  2.7 2.8 2.6   < > = values in this interval not displayed.   GFR: Estimated Creatinine Clearance: 29.1 mL/min (A) (by C-G formula based on SCr of 2.55 mg/dL (H)). Liver Function Tests: Recent Labs  Lab 03/09/20 0512 03/13/20 0533 03/14/20 0622 03/15/20 0633  ALBUMIN 1.8* 1.7* 1.6* 1.6*  No results for input(s): LIPASE, AMYLASE in the last 168 hours. No results for input(s): AMMONIA in the last 168 hours. Coagulation Profile: No results for input(s): INR, PROTIME in the last 168 hours. Cardiac Enzymes: No results for input(s): CKTOTAL, CKMB, CKMBINDEX, TROPONINI in the last 168 hours. BNP (last 3 results) No results for input(s): PROBNP in the last 8760 hours. HbA1C: No results for input(s): HGBA1C in the last 72 hours. CBG: Recent Labs  Lab 03/14/20 1641 03/14/20 2153 03/15/20 0759 03/15/20 1225 03/15/20 1726  GLUCAP 260* 206* 181* 232* 344*   Lipid Profile: No results for input(s): CHOL, HDL, LDLCALC, TRIG, CHOLHDL, LDLDIRECT in the last 72 hours. Thyroid Function Tests: No results for input(s): TSH, T4TOTAL, FREET4, T3FREE, THYROIDAB in the last 72 hours. Anemia  Panel: No results for input(s): VITAMINB12, FOLATE, FERRITIN, TIBC, IRON, RETICCTPCT in the last 72 hours. Sepsis Labs: No results for input(s): PROCALCITON, LATICACIDVEN in the last 168 hours.  Recent Results (from the past 240 hour(s))  Culture, Urine     Status: None   Collection Time: 03/10/20 11:03 AM   Specimen: Urine, Clean Catch  Result Value Ref Range Status   Specimen Description   Final    URINE, CLEAN CATCH Performed at [facility], [place] [address], [place], [place] [address]    Special Requests   Final    NONE Performed at [facility], [place] [address], [place], [place] [address]    Culture   Final    NO GROWTH Performed at [facility], [place] [address] [place], [place] [address]    Report Status 03/11/2020 FINAL  Final         Radiology Studies: No results found.      Scheduled Meds: . apixaban  5 mg Oral BID  . atorvastatin  80 mg Oral q1800  . chlorhexidine  15 mL Mouth Rinse BID  . docusate sodium  100 mg Oral BID  . feeding supplement (NEPRO CARB STEADY)  237 mL Oral BID BM  . finasteride  5 mg Oral Daily  . insulin aspart  0-15 Units Subcutaneous TID WC  . insulin aspart  0-5 Units Subcutaneous QHS  . [START ON 03/16/2020] insulin aspart  3 Units Subcutaneous TID WC  . [START ON 03/16/2020] insulin glargine  35 Units Subcutaneous Daily  . mouth rinse  15 mL Mouth Rinse q12n4p  . megestrol  400 mg Oral BID  . metoprolol tartrate  12.5 mg Oral BID  . mirtazapine  15 mg Oral QHS  . mycophenolate  360 mg Oral BID  . nystatin  5 mL Oral QID  . polyethylene glycol  17 g Oral Daily  . predniSONE  10 mg Oral Q breakfast  . sodium bicarbonate  1,300 mg Oral BID  . sodium chloride flush  3 mL Intravenous Q12H  . tacrolimus  1 mg Oral BID  . tamsulosin  0.4 mg Oral QPC breakfast   Continuous Infusions: . sodium chloride 250 mL (03/05/20 1710)     LOS: 11 days     Berle Mull , MD Pager on  [facility] 03/15/2020, 6:54 PM

## 2020-03-15 NOTE — Plan of Care (Signed)
  Problem: Health Behavior/Discharge Planning: Goal: Ability to manage health-related needs will improve Outcome: Progressing   Problem: Clinical Measurements: Goal: Complications related to the disease process or treatment will be avoided or minimized Outcome: Progressing Goal: Dialysis access will remain free of complications Outcome: Progressing   Problem: Activity: Goal: Activity intolerance will improve Outcome: Progressing   Problem: Fluid Volume: Goal: Fluid volume balance will be maintained or improved Outcome: Progressing   Problem: Nutritional: Goal: Ability to make appropriate dietary choices will improve Outcome: Progressing   Problem: Respiratory: Goal: Respiratory symptoms related to disease process will be avoided Outcome: Progressing   Problem: Self-Concept: Goal: Body image disturbance will be avoided or minimized Outcome: Progressing   Problem: Urinary Elimination: Goal: Progression of disease will be identified and treated Outcome: Progressing   Problem: Education: Goal: Ability to describe self-care measures that may prevent or decrease complications (Diabetes Survival Skills Education) will improve Outcome: Progressing Goal: Individualized Educational Video(s) Outcome: Progressing   Problem: Coping: Goal: Ability to adjust to condition or change in health will improve Outcome: Progressing   Problem: Fluid Volume: Goal: Ability to maintain a balanced intake and output will improve Outcome: Progressing   Problem: Health Behavior/Discharge Planning: Goal: Ability to identify and utilize available resources and services will improve Outcome: Progressing Goal: Ability to manage health-related needs will improve Outcome: Progressing   Problem: Metabolic: Goal: Ability to maintain appropriate glucose levels will improve Outcome: Progressing   Problem: Nutritional: Goal: Maintenance of adequate nutrition will improve Outcome: Progressing Goal:  Progress toward achieving an optimal weight will improve Outcome: Progressing   Problem: Skin Integrity: Goal: Risk for impaired skin integrity will decrease Outcome: Progressing   Problem: Tissue Perfusion: Goal: Adequacy of tissue perfusion will improve Outcome: Progressing   Problem: Nutrition Goal: Patient maintains adequate hydration Outcome: Progressing Goal: Patient maintains weight Outcome: Progressing Goal: Patient/Family demonstrates understanding of diet Outcome: Progressing Goal: Patient/Family independently completes tube feeding Outcome: Progressing Goal: Patient will have no more than 5 lb weight change during LOS Outcome: Progressing Goal: Patient will utilize adaptive techniques to administer nutrition Outcome: Progressing Goal: Patient will verbalize dietary restrictions Outcome: Progressing   Problem: Education: Goal: Knowledge of General Education information will improve Description: Including pain rating scale, medication(s)/side effects and non-pharmacologic comfort measures Outcome: Progressing   Problem: Health Behavior/Discharge Planning: Goal: Ability to manage health-related needs will improve Outcome: Progressing   Problem: Clinical Measurements: Goal: Ability to maintain clinical measurements within normal limits will improve Outcome: Progressing Goal: Will remain free from infection Outcome: Progressing Goal: Diagnostic test results will improve Outcome: Progressing Goal: Respiratory complications will improve Outcome: Progressing Goal: Cardiovascular complication will be avoided Outcome: Progressing   Problem: Activity: Goal: Risk for activity intolerance will decrease Outcome: Progressing   Problem: Nutrition: Goal: Adequate nutrition will be maintained Outcome: Progressing   Problem: Coping: Goal: Level of anxiety will decrease Outcome: Progressing   Problem: Elimination: Goal: Will not experience complications related to  bowel motility Outcome: Progressing Goal: Will not experience complications related to urinary retention Outcome: Progressing   Problem: Pain Managment: Goal: General experience of comfort will improve Outcome: Progressing   Problem: Safety: Goal: Ability to remain free from injury will improve Outcome: Progressing   Problem: Skin Integrity: Goal: Risk for impaired skin integrity will decrease Outcome: Progressing

## 2020-03-15 NOTE — Progress Notes (Signed)
[facility] Progress Note  Subjective: UOP 200 yest, creat 2.55 today. Wt's up slightly at 77kg. Pt sitting up and feeding himself breakfast.     Vitals:   03/14/20 1050 03/14/20 1341 03/15/20 0500 03/15/20 0602  BP: 140/77 113/69  (!) 138/99  Pulse: 94 82  89  Resp:  14  17  Temp:  98.2 F (36.8 C)  97.9 F (36.6 C)  TempSrc:  Oral  Oral  SpO2:  100%  98%  Weight:   80 kg   Height:        Exam: General: stable, seen in room, lying flat, no distress Heart: RRR, no rub Lungs: clear ant Abdomen: soft, nontender, RLQ renal allograft nontender Extremities: no edema Dialysis Access: LUE +large aneurysmal AVF with minimal thrill and bruit but pulsatile GU: condom cath with clear yellow urine in bag   Renal Tx [place] 6/10 > IMPRESSION: 1. Stable right transplant kidney.  No hydronephrosis. 2. Prostate enlargement    UA 6/15 - cloudy, yellow,++ LE/ neg nitrite,+ Hb, 11-20 rbc/ >50 wbc/++ bacteria    CT abd / pelvis on 6/09 - ...a transplant kidney is seen within the right renal pelvis. This is normal in size without evidence of focal lesions, hydronephrosis or renal calculi. Bladder is unremarkable.    Assessment/ Plan: 1. 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 Recent baseline renal function 1-1.5 during 12/2019 admission, now with AKI cr 2.9 which was most likely hypovolemia in setting of V/D/poor po intake + tacrolimus toxicity.  FeNa initially low.  Tac level returned at 14.5 form 6/11 which was high.  We have lowered prograf 2mg  bid  > 1mg  bid here. Started IVF"s on 6/17 at 75 /hr. Wt's are up a bit and pt eating now, will hold IVF's for now. Creat slight decline today. Repeat tac level sent Sat am (tried to time after diflucan wash-out) should be back in 1-2 days. Hoping we can get transplant function / tac levels stabilized before dc'ing.  2. Transplant immunuosuppression: Cont myfortic, pred. See above for  tacrolimus.   3. Chronic atrial fib: on eliquis and metoprolol 12.5 bid, heart rate 80-90's.  4. Hematuria/pyuria:  has outpt urologist who can follow up. Urine not bloody. Low UOP 200- 400/d. Urine culture is negative (hx of pseudomonas UTI in April 2021). 5. Hyperkalemia: resolved on renal diet 6. Metabolic acidosis: multifact - lower bicarb 1300 bid 7. Dysphagia: had thrush, got Diflucan thru 6/15 then switched to nystatin S/S due to concern for tac toxicity. Pt is eating well today, mouth is clear.  8. Diarrhea, abd pain: Per primary on admit. Appears to have resolved.  9. BP: bp's low to normal, getting metoprolol for afib at 12.5 bid 10. Malnutrition: albumin < 2, on supplements, hospitalist added remeron  11. H/o CVA - chronic debility, appears bed bound    Ryan Rice 03/15/2020, 11:05 AM   Recent Labs  Lab 03/14/20 0622 03/15/20 0633  K 3.2* 3.7  BUN 59* 58*  CREATININE 2.63* 2.55*  CALCIUM 7.9* 7.9*  PHOS 2.8 2.6  HGB 8.4* 8.8*   Inpatient medications: . apixaban  5 mg Oral BID  . atorvastatin  80 mg Oral q1800  . chlorhexidine  15 mL Mouth Rinse BID  . docusate sodium  100 mg Oral BID  . feeding supplement (NEPRO CARB STEADY)  237 mL Oral BID BM  . finasteride  5 mg Oral Daily  .  insulin aspart  0-15 Units Subcutaneous TID WC  . insulin aspart  0-5 Units Subcutaneous QHS  . insulin glargine  30 Units Subcutaneous Daily  . mouth rinse  15 mL Mouth Rinse q12n4p  . megestrol  400 mg Oral BID  . metoprolol tartrate  12.5 mg Oral BID  . mirtazapine  15 mg Oral QHS  . mycophenolate  360 mg Oral BID  . nystatin  5 mL Oral QID  . polyethylene glycol  17 g Oral Daily  . predniSONE  10 mg Oral Q breakfast  . sodium bicarbonate  1,300 mg Oral TID  . sodium chloride flush  3 mL Intravenous Q12H  . tacrolimus  1 mg Oral BID  . tamsulosin  0.4 mg Oral QPC breakfast   . sodium chloride 250 mL (03/05/20 1710)   sodium chloride, acetaminophen **OR** acetaminophen, liver  oil-zinc oxide, ondansetron **OR** ondansetron (ZOFRAN) IV

## 2020-03-16 LAB — RENAL FUNCTION PANEL
Albumin: 1.4 g/dL — ABNORMAL LOW (ref 3.5–5.0)
Anion gap: 11 (ref 5–15)
BUN: 63 mg/dL — ABNORMAL HIGH (ref 8–23)
CO2: 18 mmol/L — ABNORMAL LOW (ref 22–32)
Calcium: 8.2 mg/dL — ABNORMAL LOW (ref 8.9–10.3)
Chloride: 103 mmol/L (ref 98–111)
Creatinine, Ser: 2.69 mg/dL — ABNORMAL HIGH (ref 0.61–1.24)
GFR calc Af Amer: 27 mL/min — ABNORMAL LOW (ref >60)
GFR calc non Af Amer: 23 mL/min — ABNORMAL LOW (ref >60)
Glucose, Bld: 256 mg/dL — ABNORMAL HIGH (ref 70–99)
Phosphorus: 2.9 mg/dL (ref 2.5–4.6)
Potassium: 4.9 mmol/L (ref 3.5–5.1)
Sodium: 132 mmol/L — ABNORMAL LOW (ref 135–145)

## 2020-03-16 LAB — CBC
HCT: 24.7 % — ABNORMAL LOW (ref 39.0–52.0)
Hemoglobin: 8.2 g/dL — ABNORMAL LOW (ref 13.0–17.0)
MCH: 25.3 pg — ABNORMAL LOW (ref 26.0–34.0)
MCHC: 33.2 g/dL (ref 30.0–36.0)
MCV: 76.2 fL — ABNORMAL LOW (ref 80.0–100.0)
Platelets: 142 10*3/uL — ABNORMAL LOW (ref 150–400)
RBC: 3.24 MIL/uL — ABNORMAL LOW (ref 4.22–5.81)
RDW: 17.2 % — ABNORMAL HIGH (ref 11.5–15.5)
WBC: 9.6 10*3/uL (ref 4.0–10.5)
nRBC: 0.2 % (ref 0.0–0.2)

## 2020-03-16 LAB — GLUCOSE, CAPILLARY
Glucose-Capillary: 219 mg/dL — ABNORMAL HIGH (ref 70–99)
Glucose-Capillary: 321 mg/dL — ABNORMAL HIGH (ref 70–99)
Glucose-Capillary: 310 mg/dL — ABNORMAL HIGH (ref 70–99)
Glucose-Capillary: 324 mg/dL — ABNORMAL HIGH (ref 70–99)

## 2020-03-16 NOTE — Progress Notes (Signed)
PROGRESS NOTE    Ryan Rice  YKD:[id number] DOB: 1951-06-20 DOA: 03/04/2020 PCP: Mina Marble, PA-C    Brief Narrative:Patient is a 69 year old male with history of ESRD status post left renal transplant, history of CVA with residual expressive aphasia, proximal A. fib on Eliquis, diabetes mellitustype 2,hypertension, hyperlipidemia, peripheral vascular disease, BPH who presents for the evaluation of low blood pressure, decreased oral intake, epigastric pain. He reported poor oral intake for a week. He also reported decreased urinary output couple of days ago. Also complained of diarrhea. On presentation he was hemodynamically stable. He was found to have AKI with creatinine of 2.9. His baseline creatinine is normal. Started on IV fluids, now stopped. Nephrology also consulted.Physical therapy evaluated him and recommended skilled nursing facility on discharge.  Assessment & Plan: AKI on a renal transplant patient:  Baseline creatinine normal.  His creatinine was 1.01 on 01/24/2020.  Presented with creatinine of 2.9.  Most likely prerenal AKI associatedwith decreased oral intake. Nephrology consulted appreciate assistance. Renal function worsening again. Suspect secondary to Diflucan. Nephrology resumed IV fluid. Monitor renal function.  Will follow nephrology recommendation regarding disposition.  Currently awaiting guidance.  ESRD status post renal transplant:  Follows with nephrology at [facility].  On Prograf, Myfortic, prednisone.   Tacrolimus level high.  Likely due to Diflucan. Continue further medications per nephrology.  Odynophagia/dysphagia: Followed by speech.  Recommended dysphagia 3 diet.  High suspicion for candidal esophagitis due to his immunocompromised status.He was found to have oral candidiasis. Started on fluconazole.  Change to nystatin due to concern with Prograf. Continue for 14 days course.  Persistent A. fib:  Currently rate  is controlled.  On Eliquis 5 mg twice a day for anticoagulation.  Debility/deconditioning/generalized weakness/history of CVA: Has residual expressive aphasia and gait imbalance.  He is confused at baseline after the stroke as per his wife. He was admitted in April 2021 and was found to have evolving subacute left ACA territory infarct with petechial hemorrhage.  On Eliquis and Lipitor at home.  PT/OT recommend skilled nursingfacility. He has home health and follows with PT,OT, Speech.Wife is interested on taking him home with home health  Hypertension:  Fluctuating blood pressure.   Continue current medications.  Hyperlipidemia:  Continue Lipitor  BPH: On Flomax and Proscar.  Diabetes type 2, uncontrolled with hyper and hypoglycemia with long-term renal failure Blood glucose running on the higher side.  Dose adjusted. On insulin and oral medications at home.   Continue to monitor Accu-Cheks and adjust insulin as needed.  Diarrhea: Now rather have constipation Continue bowel regimen.  Depression. Decreased appetite patient is not eating enough to be able to safely discharge him home.  Started Remeron.  Dose increased. Also include Megace.   Patient is on Eliquis which should help to minimize DVT risk. Tolerating increased dose of the Remeron.  Will monitor.  Pressure ulcer on sacrum unstageable.  POA. Continue foam dressing.  Pressure Injury 03/05/20 Sacrum Unstageable - Full thickness tissue loss in which the base of the injury is covered by slough (yellow, tan, gray, green or brown) and/or eschar (tan, brown or black) in the wound bed. Slough filled ulcer (Active)  03/05/20 1121  Location: Sacrum  Location Orientation:   Staging: Unstageable - Full thickness tissue loss in which the base of the injury is covered by slough (yellow, tan, gray, green or brown) and/or eschar (tan, brown or black) in the wound bed.  Wound Description (Comments): Slough filled  ulcer  Present on Admission:  Yes    Estimated body mass index is 23.68 kg/m as calculated from the following:   Height as of this encounter: 5\' 11"  (1.803 m).   Weight as of this encounter: 77 kg.  DVT prophylaxis:Eliquis Code Status:Full Family Communication  no family at bedside.  Status is: Inpatient  Remains inpatient appropriate because:IV treatments appropriate due to intensity of illness or inability to take PO   Dispo: The patient is from: Home Anticipated d/c is [place]:OXBD Anticipated d/c date is: 1 to 2 days provided creatinine improves. Patient currently is not medically stable to d/c.    Consultants:Nephrology  Procedures:None  Antimicrobials: Subjective: No nausea no vomiting.  Passing gas.  Oral intake improving.  Objective: Vitals:   03/15/20 2103 03/15/20 2105 03/16/20 0504 03/16/20 1404  BP: (!) 160/88 (!) 160/88 (!) 151/84 134/78  Pulse: 86 88 80 88  Resp:   20 18  Temp: 98.4 F (36.9 C)  98.4 F (36.9 C) 98.6 F (37 C)  TempSrc: Oral  Oral Oral  SpO2: 98%  95% 96%  Weight:      Height:        Intake/Output Summary (Last 24 hours) at 03/16/2020 1847 Last data filed at 03/16/2020 1554 Gross per 24 hour  Intake 363 ml  Output 850 ml  Net -487 ml   Filed Weights   03/14/20 1000 03/15/20 0500 03/15/20 1158  Weight: 76.6 kg 80 kg 77 kg    Examination:  General: Appear in mild distress, no Rash; Oral Mucosa Clear, moist. no Abnormal Neck Mass Or lumps, Conjunctiva normal  Cardiovascular: S1 and S2 Present, no Murmur, Respiratory: good respiratory effort, Bilateral Air entry present and Clear to Auscultation, no Crackles, no wheezes Abdomen: Bowel Sound present, Soft and no tenderness Extremities: no Pedal edema, no calf tenderness Neurology: alert and oriented to place and person affect flat in affect. no new focal deficit Gait not checked due to patient safety concerns     Data  Reviewed: I have personally reviewed following labs and imaging studies  CBC: Recent Labs  Lab 03/11/20 0438 03/13/20 0533 03/14/20 0622 03/15/20 0633 03/16/20 0529  WBC 9.9 8.3 8.4 8.4 9.6  HGB 10.1* 9.7* 8.4* 8.8* 8.2*  HCT 30.8* 30.1* 26.6* 27.9* 24.7*  MCV 77.2* 79.0* 78.5* 79.7* 76.2*  PLT 129* 148* 145* 144* 532*   Basic Metabolic Panel: Recent Labs  Lab 03/12/20 0520 03/13/20 0533 03/14/20 0622 03/15/20 0633 03/16/20 0529  NA 129* 132* 131* 131* 132*  K 4.0 3.8 3.2* 3.7 4.9  CL 99 102 102 106 103  CO2 19* 21* 20* 20* 18*  GLUCOSE 261* 297* 212* 229* 256*  BUN 66* 61* 59* 58* 63*  CREATININE 2.57* 2.60* 2.63* 2.55* 2.69*  CALCIUM 8.4* 8.3* 7.9* 7.9* 8.2*  MG  --  1.3* 1.9  --   --   PHOS  --  2.7 2.8 2.6 2.9   GFR: Estimated Creatinine Clearance: 27.6 mL/min (A) (by C-G formula based on SCr of 2.69 mg/dL (H)). Liver Function Tests: Recent Labs  Lab 03/13/20 0533 03/14/20 0622 03/15/20 9924 03/16/20 0529  ALBUMIN 1.7* 1.6* 1.6* 1.4*   No results for input(s): LIPASE, AMYLASE in the last 168 hours. No results for input(s): AMMONIA in the last 168 hours. Coagulation Profile: No results for input(s): INR, PROTIME in the last 168 hours. Cardiac Enzymes: No results for input(s): CKTOTAL, CKMB, CKMBINDEX, TROPONINI in the last 168 hours. BNP (last 3 results) No results for input(s): PROBNP in the last  8760 hours. HbA1C: No results for input(s): HGBA1C in the last 72 hours. CBG: Recent Labs  Lab 03/15/20 2115 03/15/20 2118 03/16/20 0739 03/16/20 1135 03/16/20 1536  GLUCAP 283* 292* 219* 321* 310*   Lipid Profile: No results for input(s): CHOL, HDL, LDLCALC, TRIG, CHOLHDL, LDLDIRECT in the last 72 hours. Thyroid Function Tests: No results for input(s): TSH, T4TOTAL, FREET4, T3FREE, THYROIDAB in the last 72 hours. Anemia Panel: No results for input(s): VITAMINB12, FOLATE, FERRITIN, TIBC, IRON, RETICCTPCT in the last 72 hours. Sepsis Labs: No  results for input(s): PROCALCITON, LATICACIDVEN in the last 168 hours.  Recent Results (from the past 240 hour(s))  Culture, Urine     Status: None   Collection Time: 03/10/20 11:03 AM   Specimen: Urine, Clean Catch  Result Value Ref Range Status   Specimen Description   Final    URINE, CLEAN CATCH Performed at [facility], [place] [address] [place], [place] [address]    Special Requests   Final    NONE Performed at [facility], [place] [address] [place], [place] [address]    Culture   Final    NO GROWTH Performed at [facility], [place] [address], [place], [place] [address]    Report Status 03/11/2020 FINAL  Final         Radiology Studies: No results found.      Scheduled Meds: . apixaban  5 mg Oral BID  . atorvastatin  80 mg Oral q1800  . chlorhexidine  15 mL Mouth Rinse BID  . docusate sodium  100 mg Oral BID  . feeding supplement (NEPRO CARB STEADY)  237 mL Oral BID BM  . finasteride  5 mg Oral Daily  . insulin aspart  0-15 Units Subcutaneous TID WC  . insulin aspart  0-5 Units Subcutaneous QHS  . insulin aspart  3 Units Subcutaneous TID WC  . insulin glargine  35 Units Subcutaneous Daily  . mouth rinse  15 mL Mouth Rinse q12n4p  . megestrol  400 mg Oral BID  . metoprolol tartrate  12.5 mg Oral BID  . mirtazapine  15 mg Oral QHS  . mycophenolate  360 mg Oral BID  . nystatin  5 mL Oral QID  . polyethylene glycol  17 g Oral Daily  . predniSONE  10 mg Oral Q breakfast  . sodium bicarbonate  1,300 mg Oral BID  . sodium chloride flush  3 mL Intravenous Q12H  . tacrolimus  1 mg Oral BID  . tamsulosin  0.4 mg Oral QPC breakfast   Continuous Infusions: . sodium chloride 250 mL (03/05/20 1710)     LOS: 12 days     Berle Mull , MD Pager on [facility] 03/16/2020, 6:47 PM

## 2020-03-16 NOTE — Progress Notes (Signed)
Physical Therapy Treatment Patient Details Name: Ryan Rice MRN: [id number] DOB: 03-29-1951 Today's Date: 03/16/2020    History of Present Illness 69 y.o. male with medical history significant for ESRD s/p renal transplant on immunosuppressants,  hospitalized 12/29/19-01/10/20 for CVA with residual expressive aphasia, atrial fibrillation on Eliquis, type 2 diabetes, hypertension, hyperlipidemia, PVD, and BPH who is admitted with acute renal failure.    PT Comments    Pt agreeable to perform bed mobility after encouragement since pt prefers to return home.  Pt unable to get to EOB without mod assist.  Pt surprised that bed mobility was so difficult and he was unable to perform on his own.  Continue to recommend SNF upon d/c.   Follow Up Recommendations  SNF     Equipment Recommendations  Hospital bed;Wheelchair (measurements PT);Wheelchair cushion (measurements PT)    Recommendations for Other Services       Precautions / Restrictions Precautions Precautions: Fall Precaution Comments: aphasia    Mobility  Bed Mobility Overal bed mobility: Needs Assistance Bed Mobility: Supine to Sit;Sit to Supine     Supine to sit: Mod assist Sit to supine: Mod assist   General bed mobility comments: bed flat (as would be at home) increased time and effort as pt attempting to perform on his own but unable, assist required for trunk upright and LEs onto bed, pt required bed rails to self assist  Transfers                    Ambulation/Gait                 Stairs             Wheelchair Mobility    Modified Rankin (Stroke Patients Only)       Balance Overall balance assessment: Needs assistance Sitting-balance support: Bilateral upper extremity supported Sitting balance-Leahy Scale: Poor                                      Cognition Arousal/Alertness: Awake/alert Behavior During Therapy: Flat affect                                           Exercises      General Comments        Pertinent Vitals/Pain Pain Assessment: No/denies pain Pain Intervention(s): Monitored during session;Repositioned    Home Living                      Prior Function            PT Goals (current goals can now be found in the care plan section) Acute Rehab PT Goals PT Goal Formulation: With patient Time For Goal Achievement: 03/30/20 Potential to Achieve Goals: Fair Progress towards PT goals: Progressing toward goals    Frequency    Min 3X/week      PT Plan Current plan remains appropriate    Co-evaluation              AM-PAC PT "6 Clicks" Mobility   Outcome Measure  Help needed turning from your back to your side while in a flat bed without using bedrails?: A Lot Help needed moving from lying on your back to sitting on the side of a flat bed without using bedrails?:  A Lot Help needed moving to and from a bed to a chair (including a wheelchair)?: Total Help needed standing up from a chair using your arms (e.g., wheelchair or bedside chair)?: Total Help needed to walk in hospital room?: Total Help needed climbing 3-5 steps with a railing? : Total 6 Click Score: 8    End of Session   Activity Tolerance: Patient tolerated treatment well Patient left: in bed;with call bell/phone within reach;with bed alarm set Nurse Communication: Mobility status PT Visit Diagnosis: Muscle weakness (generalized) (M62.81);Difficulty in walking, not elsewhere classified (R26.2)     Time: 1173-5670 PT Time Calculation (min) (ACUTE ONLY): 11 min  Charges:  $Therapeutic Activity: 8-22 mins                    Jannette Spanner PT, DPT Acute Rehabilitation Services Pager: [phone] Office: [phone]  Trena Platt 03/16/2020, 12:59 PM

## 2020-03-16 NOTE — Progress Notes (Signed)
VAST consulted to obtain IV access as pt's access was dislodged during care this am. Pt currently has no IVF's or IV meds ordered. Madison Hickman, pt's nurse and discussed vein preservation. She verbalized understanding. She will place IVT consult if IVF's are restarted.

## 2020-03-16 NOTE — Plan of Care (Signed)
  Problem: Clinical Measurements: Goal: Complications related to the disease process or treatment will be avoided or minimized Outcome: Progressing Goal: Dialysis access will remain free of complications Outcome: Progressing   Problem: Fluid Volume: Goal: Fluid volume balance will be maintained or improved Outcome: Progressing   Problem: Respiratory: Goal: Respiratory symptoms related to disease process will be avoided Outcome: Progressing   Problem: Urinary Elimination: Goal: Progression of disease will be identified and treated Outcome: Progressing   Problem: Coping: Goal: Ability to adjust to condition or change in health will improve Outcome: Progressing   Problem: Fluid Volume: Goal: Ability to maintain a balanced intake and output will improve Outcome: Progressing   Problem: Health Behavior/Discharge Planning: Goal: Ability to identify and utilize available resources and services will improve Outcome: Progressing Goal: Ability to manage health-related needs will improve Outcome: Progressing   Problem: Metabolic: Goal: Ability to maintain appropriate glucose levels will improve Outcome: Progressing   Problem: Nutritional: Goal: Maintenance of adequate nutrition will improve Outcome: Progressing   Problem: Skin Integrity: Goal: Risk for impaired skin integrity will decrease Outcome: Progressing   Problem: Tissue Perfusion: Goal: Adequacy of tissue perfusion will improve Outcome: Progressing   Problem: Nutrition Goal: Patient maintains adequate hydration Outcome: Progressing Goal: Patient maintains weight Outcome: Progressing Goal: Patient/Family demonstrates understanding of diet Outcome: Progressing   Problem: Education: Goal: Knowledge of General Education information will improve Description: Including pain rating scale, medication(s)/side effects and non-pharmacologic comfort measures Outcome: Progressing   Problem: Health Behavior/Discharge  Planning: Goal: Ability to manage health-related needs will improve Outcome: Progressing   Problem: Clinical Measurements: Goal: Ability to maintain clinical measurements within normal limits will improve Outcome: Progressing Goal: Will remain free from infection Outcome: Progressing Goal: Diagnostic test results will improve Outcome: Progressing Goal: Respiratory complications will improve Outcome: Progressing Goal: Cardiovascular complication will be avoided Outcome: Progressing   Problem: Nutrition: Goal: Adequate nutrition will be maintained Outcome: Progressing   Problem: Coping: Goal: Level of anxiety will decrease Outcome: Progressing   Problem: Elimination: Goal: Will not experience complications related to bowel motility Outcome: Progressing Goal: Will not experience complications related to urinary retention Outcome: Progressing   Problem: Pain Managment: Goal: General experience of comfort will improve Outcome: Progressing   Problem: Safety: Goal: Ability to remain free from injury will improve Outcome: Progressing   Problem: Skin Integrity: Goal: Risk for impaired skin integrity will decrease Outcome: Progressing

## 2020-03-16 NOTE — Progress Notes (Signed)
Ryan Rice Progress Note  Subjective: UOP - only 350 recorded yest, creat 2.69-  Up from 2.55.  Flat- no c/o's-  Soup for lunch  Vitals:   03/15/20 1500 03/15/20 2103 03/15/20 2105 03/16/20 0504  BP: 111/66 (!) 160/88 (!) 160/88 (!) 151/84  Pulse: 82 86 88 80  Resp: 18   20  Temp: 98.2 F (36.8 C) 98.4 F (36.9 C)  98.4 F (36.9 C)  TempSrc:  Oral  Oral  SpO2: 96% 98%  95%  Weight:      Height:        Exam: General: stable, seen in room, lying flat, no distress- not very conversive Heart: RRR, no rub Lungs: clear ant Abdomen: soft, nontender, RLQ renal allograft nontender Extremities: no edema Dialysis Access: LUE +large aneurysmal AVF with minimal thrill and bruit but pulsatile GU: condom cath with clear yellow urine in bag   Renal Tx [place] 6/10 > IMPRESSION: 1. Stable right transplant kidney.  No hydronephrosis. 2. Prostate enlargement    UA 6/15 - cloudy, yellow,++ LE/ neg nitrite,+ Hb, 11-20 rbc/ >50 wbc/++ bacteria    CT abd / pelvis on 6/09 - ...a transplant kidney is seen within the right renal pelvis. This is normal in size without evidence of focal lesions, hydronephrosis or renal calculi. Bladder is unremarkable.    Assessment/ Plan: 1. 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 Recent baseline renal function 1-1.5 during 12/2019 admission, now with AKI cr 2.5-2.6 which was most likely hypovolemia in setting of V/D/poor po intake + tacrolimus toxicity.  FeNa initially low.  Tac level returned at 14.5.   lowered prograf 2mg  bid  > 1mg  bid here. Started IVF"s on 6/17 at 75 /hr. Creat pretty stable today. Repeat tac level sent Sat - still pending. Hoping we can get transplant function / tac levels stabilized before dc'ing.  2. Transplant immunuosuppression: Cont myfortic, pred. See above for tacrolimus.   3. Chronic atrial fib: on eliquis and metoprolol 12.5 bid, heart rate 80-90's.   4. Hematuria/pyuria:  has outpt urologist who can follow up. Urine not bloody. Low UOP 200- 400/d. Urine culture is negative (hx of pseudomonas UTI in April 2021). 5. Hyperkalemia: resolved on renal diet 6. Metabolic acidosis: multifact - lower bicarb 1300 bid 7. Dysphagia: had thrush, got Diflucan thru 6/15 then switched to nystatin S/S due to concern for tac toxicity. Pt is eating well today, mouth is clear.  8. Diarrhea, abd pain: Per primary on admit. Appears to have resolved.  9. BP: bp's low to normal, getting metoprolol for afib at 12.5 bid 10. Malnutrition: albumin < 2, on supplements, hospitalist added remeron  11. H/o CVA - chronic debility, appears bed bound  12. Dispo-  crt has been stable for last several days.  Unsure if are going to see improvement in the short term.   I would think could be stable for discharge with op f/u Dr. Posey Rice - wife is interested in taking him home   Ryan Rice  03/16/2020, 11:43 AM   Recent Labs  Lab 03/15/20 0633 03/16/20 0529  K 3.7 4.9  BUN 58* 63*  CREATININE 2.55* 2.69*  CALCIUM 7.9* 8.2*  PHOS 2.6 2.9  HGB 8.8* 8.2*   Inpatient medications: . apixaban  5 mg Oral BID  . atorvastatin  80 mg Oral q1800  . chlorhexidine  15 mL Mouth Rinse BID  . docusate sodium  100 mg  Oral BID  . feeding supplement (NEPRO CARB STEADY)  237 mL Oral BID BM  . finasteride  5 mg Oral Daily  . insulin aspart  0-15 Units Subcutaneous TID WC  . insulin aspart  0-5 Units Subcutaneous QHS  . insulin aspart  3 Units Subcutaneous TID WC  . insulin glargine  35 Units Subcutaneous Daily  . mouth rinse  15 mL Mouth Rinse q12n4p  . megestrol  400 mg Oral BID  . metoprolol tartrate  12.5 mg Oral BID  . mirtazapine  15 mg Oral QHS  . mycophenolate  360 mg Oral BID  . nystatin  5 mL Oral QID  . polyethylene glycol  17 g Oral Daily  . predniSONE  10 mg Oral Q breakfast  . sodium bicarbonate  1,300 mg Oral BID  . sodium chloride flush  3 mL  Intravenous Q12H  . tacrolimus  1 mg Oral BID  . tamsulosin  0.4 mg Oral QPC breakfast   . sodium chloride 250 mL (03/05/20 1710)   sodium chloride, acetaminophen **OR** acetaminophen, liver oil-zinc oxide, ondansetron **OR** ondansetron (ZOFRAN) IV

## 2020-03-17 LAB — RENAL FUNCTION PANEL
Albumin: 1.5 g/dL — ABNORMAL LOW (ref 3.5–5.0)
Anion gap: 9 (ref 5–15)
BUN: 65 mg/dL — ABNORMAL HIGH (ref 8–23)
CO2: 21 mmol/L — ABNORMAL LOW (ref 22–32)
Calcium: 8.4 mg/dL — ABNORMAL LOW (ref 8.9–10.3)
Chloride: 103 mmol/L (ref 98–111)
Creatinine, Ser: 2.58 mg/dL — ABNORMAL HIGH (ref 0.61–1.24)
GFR calc Af Amer: 28 mL/min — ABNORMAL LOW (ref >60)
GFR calc non Af Amer: 24 mL/min — ABNORMAL LOW (ref >60)
Glucose, Bld: 274 mg/dL — ABNORMAL HIGH (ref 70–99)
Phosphorus: 2.5 mg/dL (ref 2.5–4.6)
Potassium: 4.1 mmol/L (ref 3.5–5.1)
Sodium: 133 mmol/L — ABNORMAL LOW (ref 135–145)

## 2020-03-17 LAB — CBC
HCT: 25.9 % — ABNORMAL LOW (ref 39.0–52.0)
Hemoglobin: 8 g/dL — ABNORMAL LOW (ref 13.0–17.0)
MCH: 24.4 pg — ABNORMAL LOW (ref 26.0–34.0)
MCHC: 30.9 g/dL (ref 30.0–36.0)
MCV: 79 fL — ABNORMAL LOW (ref 80.0–100.0)
Platelets: 144 10*3/uL — ABNORMAL LOW (ref 150–400)
RBC: 3.28 MIL/uL — ABNORMAL LOW (ref 4.22–5.81)
RDW: 17.2 % — ABNORMAL HIGH (ref 11.5–15.5)
WBC: 7.7 10*3/uL (ref 4.0–10.5)
nRBC: 0 % (ref 0.0–0.2)

## 2020-03-17 LAB — GLUCOSE, CAPILLARY
Glucose-Capillary: 253 mg/dL — ABNORMAL HIGH (ref 70–99)
Glucose-Capillary: 294 mg/dL — ABNORMAL HIGH (ref 70–99)
Glucose-Capillary: 261 mg/dL — ABNORMAL HIGH (ref 70–99)
Glucose-Capillary: 236 mg/dL — ABNORMAL HIGH (ref 70–99)

## 2020-03-17 LAB — TACROLIMUS LEVEL: Tacrolimus (FK506) - LabCorp: 7.6 ng/mL (ref 2.0–20.0)

## 2020-03-17 MED ORDER — DARBEPOETIN ALFA 150 MCG/0.3ML IJ SOSY
150.0000 ug | PREFILLED_SYRINGE | Freq: Once | INTRAMUSCULAR | Status: AC
  Administered 2020-03-17: 150 ug via SUBCUTANEOUS
  Filled 2020-03-17: qty 0.3

## 2020-03-17 MED ORDER — NEPRO/CARBSTEADY PO LIQD: 237.0000 mL | Freq: Two times a day (BID) | ORAL | 0 refills | Status: DC

## 2020-03-17 MED ORDER — DRONABINOL 5 MG PO CAPS
5.0000 mg | ORAL_CAPSULE | Freq: Two times a day (BID) | ORAL | Status: DC
  Administered 2020-03-17 – 2020-03-18 (×3): 5 mg via ORAL
  Filled 2020-03-17 (×3): qty 1

## 2020-03-17 MED ORDER — POLYETHYLENE GLYCOL 3350 17 G PO PACK: 17.0000 g | PACK | Freq: Every day | ORAL | 0 refills | Status: DC

## 2020-03-17 MED ORDER — NYSTATIN 100000 UNIT/ML MT SUSP: 5.0000 mL | Freq: Four times a day (QID) | OROMUCOSAL | 0 refills | Status: AC

## 2020-03-17 MED ORDER — TACROLIMUS 1 MG PO CAPS: 1.0000 mg | ORAL_CAPSULE | Freq: Two times a day (BID) | ORAL | 0 refills | Status: DC

## 2020-03-17 MED ORDER — SODIUM BICARBONATE 650 MG PO TABS: 1300.0000 mg | ORAL_TABLET | Freq: Two times a day (BID) | ORAL | 0 refills | Status: DC

## 2020-03-17 MED ORDER — INSULIN GLARGINE 100 UNIT/ML SOLOSTAR PEN
35.0000 [IU] | PEN_INJECTOR | Freq: Every day | SUBCUTANEOUS | 0 refills | Status: DC
Start: 2020-03-17 — End: 2020-05-04

## 2020-03-17 MED ORDER — MIRTAZAPINE 15 MG PO TABS: 15.0000 mg | ORAL_TABLET | Freq: Every day | ORAL | 0 refills | Status: DC

## 2020-03-17 NOTE — Progress Notes (Signed)
[facility] Progress Note  Subjective: UOP - only 475 recorded yest, creat stable in the mid 2's.  Flat- no c/o's-   Vitals:   03/16/20 0504 03/16/20 1404 03/16/20 2147 03/17/20 0520  BP: (!) 151/84 134/78 126/88 (!) 145/81  Pulse: 80 88 91 94  Resp: 20 18 20 20   Temp: 98.4 F (36.9 C) 98.6 F (37 C) 98.4 F (36.9 C) 99.2 F (37.3 C)  TempSrc: Oral Oral Oral Oral  SpO2: 95% 96% 96% 96%  Weight:    82.2 kg  Height:        Exam: General: stable, seen in room, lying flat, no distress- not very conversive Heart: RRR, no rub Lungs: clear ant Abdomen: soft, nontender, RLQ renal allograft nontender Extremities: no edema Dialysis Access: LUE +large aneurysmal AVF with minimal thrill and bruit but pulsatile GU: condom cath with clear yellow urine in bag   Renal Tx [place] 6/10 > IMPRESSION: 1. Stable right transplant kidney.  No hydronephrosis. 2. Prostate enlargement    UA 6/15 - cloudy, yellow,++ LE/ neg nitrite,+ Hb, 11-20 rbc/ >50 wbc/++ bacteria    CT abd / pelvis on 6/09 - ...a transplant kidney is seen within the right renal pelvis. This is normal in size without evidence of focal lesions, hydronephrosis or renal calculi. Bladder is unremarkable.    Assessment/ Plan: 1. 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 Recent baseline renal function 1.5 during 12/2019 admission, now with AKI cr 2.5-2.6 which was most likely hypovolemia in setting of V/D/poor po intake + tacrolimus toxicity.  FeNa initially low.  Tac level returned at 14.5.   lowered prograf 2mg  bid  > 1mg  bid here. Started IVF"s on 6/17 at 75 /hr. Creat pretty stable today. Repeat tac level 7.6. Would keep on dose of prograf 1 mg BID for now as OP 2. Transplant immunuosuppression: Cont myfortic, pred. See above for tacrolimus- keep at 1 mg BID.   3. Chronic atrial fib: on eliquis and metoprolol 12.5 bid, heart rate 80-90's.   4. Hematuria/pyuria:  has outpt urologist who can follow up. Urine not bloody.  5. Hyperkalemia: resolved on renal diet 6. Metabolic acidosis: multifact - lower bicarb 1300 bid 7. Dysphagia: had thrush, got Diflucan thru 6/15 then switched to nystatin S/S due to concern for tac toxicity. Pt is eating well today, mouth is clear.  8. Diarrhea, abd pain: Per primary on admit. Appears to have resolved.  9. BP: bp's low to normal, getting metoprolol for afib at 12.5 bid 10. Malnutrition: albumin < 2, on supplements, hospitalist added remeron  11. H/o CVA - chronic debility, appears bed bound  12. Anemia-  Will dose ESA today times one for low hgb  13. Dispo-  crt has been stable for last several days.  Unsure if are going to see improvement in the short term.   I would think could be stable for discharge with op f/u [name] - wife is interested in taking him home   Ryan Rice  03/17/2020, 12:02 PM   Recent Labs  Lab 03/16/20 0529 03/17/20 0528  K 4.9 4.1  BUN 63* 65*  CREATININE 2.69* 2.58*  CALCIUM 8.2* 8.4*  PHOS 2.9 2.5  HGB 8.2* 8.0*   Inpatient medications: . apixaban  5 mg Oral BID  . atorvastatin  80 mg Oral q1800  . chlorhexidine  15 mL Mouth Rinse BID  . docusate sodium  100 mg  Oral BID  . dronabinol  5 mg Oral BID AC  . feeding supplement (NEPRO CARB STEADY)  237 mL Oral BID BM  . finasteride  5 mg Oral Daily  . insulin aspart  0-15 Units Subcutaneous TID WC  . insulin aspart  0-5 Units Subcutaneous QHS  . insulin aspart  3 Units Subcutaneous TID WC  . insulin glargine  35 Units Subcutaneous Daily  . mouth rinse  15 mL Mouth Rinse q12n4p  . megestrol  400 mg Oral BID  . metoprolol tartrate  12.5 mg Oral BID  . mirtazapine  15 mg Oral QHS  . mycophenolate  360 mg Oral BID  . nystatin  5 mL Oral QID  . polyethylene glycol  17 g Oral Daily  . predniSONE  10 mg Oral Q breakfast  . sodium bicarbonate  1,300 mg Oral BID  . sodium chloride flush  3 mL  Intravenous Q12H  . tacrolimus  1 mg Oral BID  . tamsulosin  0.4 mg Oral QPC breakfast   . sodium chloride 250 mL (03/05/20 1710)   sodium chloride, acetaminophen **OR** acetaminophen, liver oil-zinc oxide, ondansetron **OR** ondansetron (ZOFRAN) IV

## 2020-03-17 NOTE — Progress Notes (Signed)
[facility] Progress Note  Patient: Ryan Rice    EXN:[id number]  DOA: 03/04/2020     Date of Service: the patient was seen and examined on 03/17/2020  Chief Complaint  Patient presents with   Hypotension   Brief hospital course: Patient is a 69 year old male with history of ESRD status post left renal transplant, history of CVA with residual expressive aphasia, proximal A. fib on Eliquis, diabetes mellitustype 2,hypertension, hyperlipidemia, peripheral vascular disease, BPH who presents for the evaluation of low blood pressure, decreased oral intake, epigastric pain. He reported poor oral intake for a week. He also reported decreased urinary output couple of days ago. Also complained of diarrhea. On presentation he was hemodynamically stable. He was found to have AKI with creatinine of 2.9. His baseline creatinine is normal. Started on IV fluids, now stopped. Nephrology also consulted.Physical therapy evaluated him and recommended skilled nursing facility on discharge. Family wants to take the patient home.  Currently plan is monitor for tolerance of Marinol and arrange for discharge tomorrow.  Assessment and Plan: AKI on a renal transplant patient:  Baseline creatinine normal.  His creatinine was 1.01 on 01/24/2020.  Presented with creatinine of 2.9.  Most likely prerenal AKI associatedwith decreased oral intake. Nephrology consulted appreciate assistance. Renal function worsening again. Suspect secondary to Diflucan. Nephrology resumed IV fluid. Monitor renal function.   Patient can be discharged home from nephrology perspective.  ESRD status post renal transplant:  Follows with nephrology at [facility].  On Prograf, Myfortic, prednisone.   Tacrolimus level high.  Likely due to Diflucan. Continue further medications per nephrology.  Odynophagia/dysphagia: Followed by speech.  Recommended dysphagia 3 diet.  High suspicion for candidal esophagitis due  to his immunocompromised status.He was found to have oral candidiasis. Started on fluconazole.  Change to nystatin due to concern with Prograf. Continue for 14 days course.  Persistent A. fib:  Currently rate is controlled.  On Eliquis 5 mg twice a day for anticoagulation.  Debility/deconditioning/generalized weakness/history of CVA: Has residual expressive aphasia and gait imbalance.  He is confused at baseline after the stroke as per his wife. He was admitted in April 2021 and was found to have evolving subacute left ACA territory infarct with petechial hemorrhage.  On Eliquis and Lipitor at home.  PT/OT recommend skilled nursingfacility. He has home health and follows with PT,OT, Speech.Wife is interested on taking him home with home health  Hypertension:  Fluctuating blood pressure.   Continue current medications.  Hyperlipidemia:  Continue Lipitor  BPH: On Flomax and Proscar.  Diabetes type 2, uncontrolled with hyper and hypoglycemia with long-term renal failure Blood glucose running on the higher side.  Dose adjusted. On insulin and oral medications at home.   Continue to monitor Accu-Cheks and adjust insulin as needed.  Diarrhea: Now rather have constipation Continue bowel regimen.  Depression. Decreased appetite patient is not eating enough to be able to safely discharge him home. Started Remeron.  Dose increased. Also include Megace.   Will try Marinol and monitor. Tolerating increased dose of the Remeron.  Will monitor.  Pressure ulcer on sacrum unstageable.  POA. Continue foam dressing. Pressure Injury 03/05/20 Sacrum Unstageable - Full thickness tissue loss in which the base of the injury is covered by slough (yellow, tan, gray, green or brown) and/or eschar (tan, brown or black) in the wound bed. Slough filled ulcer (Active)  03/05/20 1121  Location: Sacrum  Location Orientation:   Staging: Unstageable - Full thickness tissue loss in which  the  base of the injury is covered by slough (yellow, tan, gray, green or brown) and/or eschar (tan, brown or black) in the wound bed.  Wound Description (Comments): Slough filled ulcer  Present on Admission: Yes    Diet: Renal diet DVT Prophylaxis: Therapeutic Anticoagulation with Eliquis   Advance goals of care discussion: Full code  Family Communication: no family was present at bedside, at the time of interview.  Discussed with wife on the phone. The pt provided permission to discuss medical plan with the family. Opportunity was given to ask question and all questions were answered satisfactorily.   Disposition:  Status is: Inpatient  Remains inpatient appropriate because:Unsafe d/c plan wife unable to take care of the patient today.   Dispo: The patient is from: Home              Anticipated d/c is to: Home              Anticipated d/c date is: 1 day              Patient currently is not medically stable to d/c. Likely home tomorrow  Subjective: No nausea no vomiting. Remains with limited oral intake. Discussed with wife who is unable to take care of the patient today.  Physical Exam:  General: Appear in mild distress, no Rash; Oral Mucosa Clear, moist. no Abnormal Neck Mass Or lumps, Conjunctiva normal  Cardiovascular: S1 and S2 Present, no Murmur, Respiratory: good respiratory effort, Bilateral Air entry present and Clear to Auscultation, no Crackles, no wheezes Abdomen: Bowel Sound present, Soft and no tenderness Extremities: no Pedal edema, no calf tenderness Neurology: alert and oriented to time, place, and person affect appropriate. no new focal deficit Gait not checked due to patient safety concerns  Vitals:   03/16/20 1404 03/16/20 2147 03/17/20 0520 03/17/20 1342  BP: 134/78 126/88 (!) 145/81 121/69  Pulse: 88 91 94 94  Resp: 18 20 20 17   Temp: 98.6 F (37 C) 98.4 F (36.9 C) 99.2 F (37.3 C) 98.6 F (37 C)  TempSrc: Oral Oral Oral Oral  SpO2: 96% 96% 96%  100%  Weight:   82.2 kg   Height:        Intake/Output Summary (Last 24 hours) at 03/17/2020 2028 Last data filed at 03/17/2020 1907 Gross per 24 hour  Intake 717 ml  Output 850 ml  Net -133 ml   Filed Weights   03/15/20 0500 03/15/20 1158 03/17/20 0520  Weight: 80 kg 77 kg 82.2 kg    Data Reviewed: I have personally reviewed and interpreted daily labs, tele strips, imagings as discussed above. I reviewed all nursing notes, pharmacy notes, vitals, pertinent old records I have discussed plan of care as described above with RN and patient/family.  CBC: Recent Labs  Lab 03/13/20 0533 03/14/20 0622 03/15/20 1610 03/16/20 0529 03/17/20 0528  WBC 8.3 8.4 8.4 9.6 7.7  HGB 9.7* 8.4* 8.8* 8.2* 8.0*  HCT 30.1* 26.6* 27.9* 24.7* 25.9*  MCV 79.0* 78.5* 79.7* 76.2* 79.0*  PLT 148* 145* 144* 142* 960*   Basic Metabolic Panel: Recent Labs  Lab 03/13/20 0533 03/14/20 0622 03/15/20 0633 03/16/20 0529 03/17/20 0528  NA 132* 131* 131* 132* 133*  K 3.8 3.2* 3.7 4.9 4.1  CL 102 102 106 103 103  CO2 21* 20* 20* 18* 21*  GLUCOSE 297* 212* 229* 256* 274*  BUN 61* 59* 58* 63* 65*  CREATININE 2.60* 2.63* 2.55* 2.69* 2.58*  CALCIUM 8.3* 7.9* 7.9* 8.2* 8.4*  MG 1.3* 1.9  --   --   --   PHOS 2.7 2.8 2.6 2.9 2.5    Studies: No results found.  Scheduled Meds:  apixaban  5 mg Oral BID   atorvastatin  80 mg Oral q1800   chlorhexidine  15 mL Mouth Rinse BID   docusate sodium  100 mg Oral BID   dronabinol  5 mg Oral BID AC   feeding supplement (NEPRO CARB STEADY)  237 mL Oral BID BM   finasteride  5 mg Oral Daily   insulin aspart  0-15 Units Subcutaneous TID WC   insulin aspart  0-5 Units Subcutaneous QHS   insulin aspart  3 Units Subcutaneous TID WC   insulin glargine  35 Units Subcutaneous Daily   mouth rinse  15 mL Mouth Rinse q12n4p   megestrol  400 mg Oral BID   metoprolol tartrate  12.5 mg Oral BID   mirtazapine  15 mg Oral QHS   mycophenolate  360 mg Oral  BID   nystatin  5 mL Oral QID   polyethylene glycol  17 g Oral Daily   predniSONE  10 mg Oral Q breakfast   sodium bicarbonate  1,300 mg Oral BID   sodium chloride flush  3 mL Intravenous Q12H   tacrolimus  1 mg Oral BID   tamsulosin  0.4 mg Oral QPC breakfast   Continuous Infusions:  sodium chloride 250 mL (03/05/20 1710)   PRN Meds: sodium chloride, acetaminophen **OR** acetaminophen, liver oil-zinc oxide, ondansetron **OR** ondansetron (ZOFRAN) IV  Time spent: 35 minutes  Author: Berle Mull, MD [facility] 03/17/2020 8:28 PM  To reach On-call, see care teams to locate the attending and reach out via [email or web address]. Between 7PM-7AM, please contact night-coverage If you still have difficulty reaching the attending provider, please page the [facility] (Director on Call) for [facility] on amion for assistance.

## 2020-03-17 NOTE — Progress Notes (Signed)
Occupational Therapy Treatment Patient Details Name: Ryan Rice MRN: [id number] DOB: December 05, 1950 Today's Date: 03/17/2020    History of present illness 69 y.o. male with medical history significant for ESRD s/p renal transplant on immunosuppressants,  hospitalized 12/29/19-01/10/20 for CVA with residual expressive aphasia, atrial fibrillation on Eliquis, type 2 diabetes, hypertension, hyperlipidemia, PVD, and BPH who is admitted with acute renal failure.   OT comments  Patient seated in recliner when therapist entered the room. Attempted to stand for ADL tasks but unable to get into standing without +2 assistance. Patient performed hip flexion reps to promote strengthening of hips needed for sit to stand. Patient performed reaching forward out of base of support - from partially reclined position in chair to tap walker with bilateral hands placed in front of him - to promote trunk activation, reduce posterior pelvic tilt and posterior position in order to improve sit to stand and functional mobility. Discussed with patient the need for rehab due to his limited mobility at this time.    Follow Up Recommendations  SNF;Supervision/Assistance - 24 hour    Equipment Recommendations  Other (comment)    Recommendations for Other Services      Precautions / Restrictions Precautions Precautions: Fall Restrictions Weight Bearing Restrictions: No       Mobility Bed Mobility                  Transfers                 General transfer comment: Attempted sit to stand but unable to get patient into standing without +2 assistance.    Balance                                           ADL either performed or assessed with clinical judgement   ADL                                               Vision       Perception     Praxis      Cognition Arousal/Alertness: Awake/alert Behavior During Therapy: Flat affect Overall Cognitive  Status: No family/caregiver present to determine baseline cognitive functioning                                          Exercises Other Exercises Other Exercises: Hip Flexion x 10 reps each leg Other Exercises: Bilateral Reaching out of base of support 10 reps x sets   Shoulder Instructions       General Comments      Pertinent Vitals/ Pain       Pain Assessment: No/denies pain  Home Living                                          Prior Functioning/Environment              Frequency  Min 2X/week        Progress Toward Goals  OT Goals(current goals can now be found in the care plan section)  Progress towards  OT goals: Progressing toward goals  Acute Rehab OT Goals OT Goal Formulation: With patient Time For Goal Achievement: 03/19/20 Potential to Achieve Goals: [place] Discharge plan remains appropriate    Co-evaluation          OT goals addressed during session: Strengthening/ROM (functional mobility)      AM-PAC OT "6 Clicks" Daily Activity     Outcome Measure   Help from another person eating meals?: A [place] Help from another person taking care of personal grooming?: A [place] Help from another person toileting, which includes using toliet, bedpan, or urinal?: Total Help from another person bathing (including washing, rinsing, drying)?: A Lot Help from another person to put on and taking off regular upper body clothing?: A [place] Help from another person to put on and taking off regular lower body clothing?: Total 6 Click Score: 13    End of Session Equipment Utilized During Treatment: Rolling walker;Gait belt  OT Visit Diagnosis: Unsteadiness on feet (R26.81);Other abnormalities of gait and mobility (R26.89);Muscle weakness (generalized) (M62.81);Other symptoms and signs involving cognitive function   Activity Tolerance Patient tolerated treatment well   Patient Left in chair;with call bell/phone within  reach;with chair alarm set   Nurse Communication  (okay to see patient)        Time: 7618-4859 OT Time Calculation (min): 17 min  Charges: OT General Charges $OT Visit: 1 Visit OT Treatments $Therapeutic Activity: 8-22 mins  Derl Barrow, OTR/L [place]  Office [phone] Pager: [place] 03/17/2020, 12:31 PM

## 2020-03-18 LAB — RENAL FUNCTION PANEL
Albumin: 1.5 g/dL — ABNORMAL LOW (ref 3.5–5.0)
Anion gap: 9 (ref 5–15)
BUN: 67 mg/dL — ABNORMAL HIGH (ref 8–23)
CO2: 22 mmol/L (ref 22–32)
Calcium: 8.3 mg/dL — ABNORMAL LOW (ref 8.9–10.3)
Chloride: 102 mmol/L (ref 98–111)
Creatinine, Ser: 2.79 mg/dL — ABNORMAL HIGH (ref 0.61–1.24)
GFR calc Af Amer: 26 mL/min — ABNORMAL LOW (ref >60)
GFR calc non Af Amer: 22 mL/min — ABNORMAL LOW (ref >60)
Glucose, Bld: 279 mg/dL — ABNORMAL HIGH (ref 70–99)
Phosphorus: 2.5 mg/dL (ref 2.5–4.6)
Potassium: 4.1 mmol/L (ref 3.5–5.1)
Sodium: 133 mmol/L — ABNORMAL LOW (ref 135–145)

## 2020-03-18 LAB — GLUCOSE, CAPILLARY
Glucose-Capillary: 267 mg/dL — ABNORMAL HIGH (ref 70–99)
Glucose-Capillary: 288 mg/dL — ABNORMAL HIGH (ref 70–99)

## 2020-03-18 MED ORDER — DRONABINOL 5 MG PO CAPS: 5.0000 mg | ORAL_CAPSULE | Freq: Two times a day (BID) | ORAL | 0 refills | Status: DC

## 2020-03-18 NOTE — TOC Transition Note (Signed)
Transition of Care [facility]) - CM/SW Discharge Note   Patient Details  Name: Ryan Rice MRN: [id number] Date of Birth: 12/14/1950  [facility] CM/SW Contact:  Dessa Phi, RN Phone Number: 03/18/2020, 2:40 PM   Clinical Narrative:   D/c home w/HH rep Ryan Rice aware of [place] ordered. Spouse able to transport ome on own. No further CM needs.    Final next level of care: Ryan Rice Barriers to Discharge: No Barriers Identified   Patient Goals and CMS Choice Patient states their goals for this hospitalization and ongoing recovery are:: go home CMS Medicare.gov Compare Post Acute Care list provided to:: Patient Choice offered to / list presented to : Patient  Discharge Placement                       Discharge Plan and Services   Discharge Planning Services: CM Consult Post Acute Care Choice: Home Health                    HH Arranged: RN, PT, OT, Speech Therapy [place]: [facility]) Date [place]: 03/18/20 Time [place]: 1440 Representative spoke with at [place]: [place] ([place]) Interventions     Readmission Risk Interventions Readmission Risk Prevention Plan 12/31/2019  Transportation Screening Complete  HRI or Home Care Consult Complete  Palliative Care Screening Not Applicable  Medication Review (RN Care Manager) Referral to Pharmacy

## 2020-03-18 NOTE — Progress Notes (Signed)
[facility] Progress Note  Subjective: UOP 800 , creat pretty stable in the mid 2's.  Flat- no c/o's- may need to go to SNF ?  Vitals:   03/17/20 2050 03/18/20 0447 03/18/20 0448 03/18/20 1037  BP: 137/86  140/77   Pulse: 85  80 90  Resp: 18  16   Temp: 98.7 F (37.1 C)  98.6 F (37 C)   TempSrc: Oral  Oral   SpO2: 100%  100%   Weight:  81.4 kg    Height:        Exam: General: stable, seen in room, lying flat, no distress- not very conversive Heart: RRR, no rub Lungs: clear ant Abdomen: soft, nontender, RLQ renal allograft nontender Extremities: no edema Dialysis Access: LUE +large aneurysmal AVF with minimal thrill and bruit but pulsatile GU: condom cath with clear yellow urine in bag   Renal Tx [place] 6/10 > IMPRESSION: 1. Stable right transplant kidney.  No hydronephrosis. 2. Prostate enlargement    UA 6/15 - cloudy, yellow,++ LE/ neg nitrite,+ Hb, 11-20 rbc/ >50 wbc/++ bacteria    CT abd / pelvis on 6/09 - ...a transplant kidney is seen within the right renal pelvis. This is normal in size without evidence of focal lesions, hydronephrosis or renal calculi. Bladder is unremarkable.    Assessment/ Plan: 1. 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 Recent baseline renal function 1.5 during 12/2019 admission, now with AKI cr 2.5-2.7 which was most likely hypovolemia in setting of V/D/poor po intake + tacrolimus toxicity.  FeNa initially low.  Tac level 14.5.   lowered prograf 2mg  bid  > 1mg  bid here-  Repeat tac level 7.6. Would keep on dose of prograf 1 mg BID for now as OP 2. Transplant immunuosuppression: Cont myfortic, pred. See above for tacrolimus- keep at 1 mg BID.   3. Chronic atrial fib: on eliquis and metoprolol 12.5 bid, heart rate 80-90's.  4. Hematuria/pyuria:  has outpt urologist who can follow up. Urine not bloody.  5. Hyperkalemia: resolved on renal diet 6. Metabolic acidosis:  multifact - lower bicarb 1300 bid 7. Dysphagia: had thrush, got Diflucan thru 6/15 then switched to nystatin S/S due to concern for tac toxicity. Pt is eating well today, mouth is clear.  8. Diarrhea, abd pain: Per primary on admit. Appears to have resolved.  9. BP: bp's low to normal, getting metoprolol for afib at 12.5 bid 10. Malnutrition: albumin < 2, on supplements, hospitalist added remeron  11. H/o CVA - chronic debility, appears bed bound  12. Anemia-  Will dose ESA today times one for low hgb  13. Dispo-  crt has been stable for last several days.  Unsure if are going to see improvement in the short term.   I would think could be stable for discharge with op f/u [name] - wife is interested in taking him home- not sure what the ultimate d/c plan is-  Is cleared by me for discharge -  Renal will sign off-  OP follow up will be arranged    Ryan Rice  03/18/2020, 11:53 AM   Recent Labs  Lab 03/16/20 0529 03/16/20 0529 03/17/20 0528 03/18/20 0658  K 4.9   < > 4.1 4.1  BUN 63*   < > 65* 67*  CREATININE 2.69*   < > 2.58* 2.79*  CALCIUM 8.2*   < > 8.4* 8.3*  PHOS 2.9   < >  2.5 2.5  HGB 8.2*  --  8.0*  --    < > = values in this interval not displayed.   Inpatient medications:  apixaban  5 mg Oral BID   atorvastatin  80 mg Oral q1800   chlorhexidine  15 mL Mouth Rinse BID   docusate sodium  100 mg Oral BID   dronabinol  5 mg Oral BID AC   feeding supplement (NEPRO CARB STEADY)  237 mL Oral BID BM   finasteride  5 mg Oral Daily   insulin aspart  0-15 Units Subcutaneous TID WC   insulin aspart  0-5 Units Subcutaneous QHS   insulin aspart  3 Units Subcutaneous TID WC   insulin glargine  35 Units Subcutaneous Daily   mouth rinse  15 mL Mouth Rinse q12n4p   megestrol  400 mg Oral BID   metoprolol tartrate  12.5 mg Oral BID   mirtazapine  15 mg Oral QHS   mycophenolate  360 mg Oral BID   nystatin  5 mL Oral QID   polyethylene glycol  17 g Oral  Daily   predniSONE  10 mg Oral Q breakfast   sodium bicarbonate  1,300 mg Oral BID   sodium chloride flush  3 mL Intravenous Q12H   tacrolimus  1 mg Oral BID   tamsulosin  0.4 mg Oral QPC breakfast    sodium chloride 250 mL (03/05/20 1710)   sodium chloride, acetaminophen **OR** acetaminophen, liver oil-zinc oxide, ondansetron **OR** ondansetron (ZOFRAN) IV

## 2020-03-18 NOTE — Discharge Summary (Signed)
Discharge Summary  Ryan Rice [id number] DOB: January 21, 1951  PCP: Mina Marble, PA-C  Admit date: 03/04/2020 Discharge date: 03/18/2020  Time spent: 40 mins  Recommendations for Outpatient Follow-up:  1. PCP in 1 week 2. Nephrology as scheduled  Discharge Diagnoses:  Active Hospital Problems   Diagnosis Date Noted  . ARF (acute renal failure) ([place]) 03/04/2020  . Pressure injury of skin 03/07/2020  . History of CVA (cerebrovascular accident) 03/04/2020  . Hyperlipidemia associated with type 2 diabetes mellitus ([place]) 03/04/2020  . Hypertension associated with diabetes ([place])   . Atrial fibrillation, chronic ([place]) 12/30/2019  . Diabetes ([place]) 03/15/2019  . Kidney transplant recipient     Resolved Hospital Problems  No resolved problems to display.    Discharge Condition: Stable  Diet recommendation: Renal diet  Vitals:   03/18/20 1037 03/18/20 1240  BP:  (!) 114/59  Pulse: 90 87  Resp:  17  Temp:  98.6 F (37 C)  SpO2:  99%    History of present illness:  Patient is a 69 year old male with history of ESRD status post left renal transplant, history of CVA with residual expressive aphasia, proximal A. fib on Eliquis, diabetes mellitustype 2,hypertension, hyperlipidemia, peripheral vascular disease, BPH who presents for the evaluation of low blood pressure, decreased oral intake, epigastric pain. He reported poor oral intake for a week. He also reported decreased urinary output couple of days ago. Also complained of diarrhea. On presentation he was hemodynamically stable. He was found to have AKI with creatinine of 2.9. His baseline creatinine is normal. Started on IV fluids, now stopped. Nephrology also consulted.Physical therapy evaluated him and recommended skilled nursing facility on discharge. Family wants to take the patient home.   Today, pt with no new complaints, denies any chest pain, shortness of breath, abdominal pain, nausea/vomiting,  fever/chills.  Discussed extensively with wife, still wants to take patient home refusing SNF, advised to follow-up with PCP in 1 week and nephrology as scheduled.  All questions answered.    Hospital Course:  Principal Problem:   ARF (acute renal failure) (HCC) Active Problems:   Kidney transplant recipient   Diabetes [facility])   Atrial fibrillation, chronic (HCC)   Hypertension associated with diabetes ([place])   History of CVA (cerebrovascular accident)   Hyperlipidemia associated with type 2 diabetes mellitus ([place])   Pressure injury of skin  AKI on a renal transplant patient Baseline creatinine normal.  His creatinine was 1.01 on 01/24/2020, presented with creatinine of 2.9.  Nephrology consulted appreciated assistance. Renal function worsening, but stable Suspect secondary to Diflucan and tacrolimus Patient can be discharged home from nephrology perspective PCP and nephrology follow-up  ESRD status post renal transplant  Follows with nephrology at [facility]. On Prograf, Myfortic, prednisone.  Tacrolimus level high. Likely due to Diflucan, use tacrolimus dose to 1 mg twice daily, switch Diflucan to nystatin Follow-up in nephrology  Odynophagia/dysphagia Followed by speech.  Recommended dysphagia 3 diet.  High suspicion for candidal esophagitis due to his immunocompromised status.He was found to have oral candidiasis DC on nystatin for a 14-day course  Persistent A. fib Currently rate is controlled.  Continue Eliquis  Debility/deconditioning/generalized weakness/history of CVA Has residual expressive aphasia and gait imbalance.  He is confused at baseline after the stroke as per his wife. He was admitted in April 2021 and was found to have evolving subacute left ACA territory infarct with petechial hemorrhage.  On Eliquis and Lipitor at home.  PT/OT recommend skilled nursingfacility. He has home health  and follows with PT,OT, Speech  Hypertension   Continue home medications.  Hyperlipidemia:  Continue Lipitor  BPH: On Flomax and Proscar.  Diabetes type 2, uncontrolled with hyper and hypoglycemia with long-term renal failure Continue home regimen  Depression. Decreased appetite Discharge on Remeron, Marinol  Pressure ulcer on sacrum unstageable. POA.        Malnutrition Type:      Malnutrition Characteristics:      Nutrition Interventions:      Estimated body mass index is 25.03 kg/m as calculated from the following:   Height as of this encounter: 5\' 11"  (1.803 m).   Weight as of this encounter: 81.4 kg.    Procedures:  None  Consultations:  Nephrology  Discharge Exam: BP (!) 114/59 (BP Location: Right Arm)   Pulse 87   Temp 98.6 F (37 C) (Oral)   Resp 17   Ht 5\' 11"  (1.803 m)   Wt 81.4 kg   SpO2 99%   BMI 25.03 kg/m   General: NAD, chronically ill-appearing Cardiovascular: S1, S2 present Respiratory: CTA B  Discharge Instructions You were cared for by a hospitalist during your hospital stay. If you have any questions about your discharge medications or the care you received while you were in the hospital after you are discharged, you can call the unit and asked to speak with the hospitalist on call if the hospitalist that took care of you is not available. Once you are discharged, your primary care physician will handle any further medical issues. Please note that NO REFILLS for any discharge medications will be authorized once you are discharged, as it is imperative that you return to your primary care physician (or establish a relationship with a primary care physician if you do not have one) for your aftercare needs so that they can reassess your need for medications and monitor your lab values.  Discharge Instructions    Diet - low sodium heart healthy   Complete by: As directed    Discharge wound care:   Complete by: As directed    Wound care as recommended   Increase  activity slowly   Complete by: As directed      Allergies as of 03/18/2020      Reactions   Iodinated Diagnostic Agents Hives   Tape Hives      Medication List    STOP taking these medications   diclofenac Sodium 1 % Gel Commonly known as: VOLTAREN   docusate sodium 100 MG capsule Commonly known as: COLACE   doxycycline 100 MG EC tablet Commonly known as: DORYX   furosemide 40 MG tablet Commonly known as: Lasix   metFORMIN 500 MG 24 hr tablet Commonly known as: GLUCOPHAGE-XR   nateglinide 120 MG tablet Commonly known as: STARLIX   NIFEdipine 60 MG 24 hr tablet Commonly known as: ADALAT CC   pioglitazone 30 MG tablet Commonly known as: ACTOS     TAKE these medications   acetaminophen 325 MG tablet Commonly known as: TYLENOL Take 1-2 tablets (325-650 mg total) by mouth every 4 (four) hours as needed for mild pain.   apixaban 5 MG Tabs tablet Commonly known as: ELIQUIS Take 1 tablet (5 mg total) by mouth 2 (two) times daily. Notes to patient: Next dose due this evening 06/23   artificial tears Oint ophthalmic ointment Commonly known as: LACRILUBE Place into the left eye at bedtime as needed for dry eyes.   atorvastatin 80 MG tablet Commonly known as: LIPITOR Take 1 tablet (  80 mg total) by mouth daily at 6 PM. Notes to patient: Next dose due this evening 06/23   brimonidine 0.2 % ophthalmic solution Commonly known as: ALPHAGAN Place 1 drop into the right eye 2 (two) times daily. Notes to patient: Next dose due this evening 06/23   cinacalcet 90 MG tablet Commonly known as: SENSIPAR Take 90 mg by mouth daily. Notes to patient: Next dose due in the morning 06/24   dorzolamide-timolol 22.3-6.8 MG/ML ophthalmic solution Commonly known as: COSOPT Place 1 drop into the right eye 2 (two) times a day. Notes to patient: Next dose due this evening 06/23   dronabinol 5 MG capsule Commonly known as: MARINOL Take 1 capsule (5 mg total) by mouth 2 (two) times  daily before lunch and supper. Notes to patient: Next dose due before supper 06/23   famotidine 20 MG tablet Commonly known as: PEPCID Take 1 tablet (20 mg total) by mouth daily. What changed: when to take this Notes to patient: Next dose due in the morning 06/24   feeding supplement (NEPRO CARB STEADY) Liqd Take 237 mLs by mouth 2 (two) times daily between meals. Notes to patient: Next dose due in the morning 06/24 after breakfast   finasteride 5 MG tablet Commonly known as: PROSCAR Take 1 tablet (5 mg total) by mouth daily. Notes to patient: Next dose due in the morning 71/69   folic acid 1 MG tablet Commonly known as: FOLVITE TAKE 1 TABLET BY MOUTH EVERY DAY Notes to patient: Next dose due in the morning 06/24   hydrALAZINE 25 MG tablet Commonly known as: APRESOLINE Take 1 tablet (25 mg total) by mouth every 6 (six) hours. Notes to patient: Next dose due this evening 06/23 @ 1800   insulin glargine 100 UNIT/ML Solostar Pen Commonly known as: LANTUS Inject 35 Units into the skin daily. What changed: how much to take Notes to patient: Next dose due in the morning 06/24   Insulin Pen Needle 32G X 6 MM Misc 1 application by Does not apply route daily.   Januvia 100 MG tablet Generic drug: sitaGLIPtin Take 100 mg by mouth daily. Notes to patient: Next dose due in the morning 06/24   magnesium oxide 400 MG tablet Commonly known as: MAG-OX Take 1 tablet (400 mg total) by mouth daily. Notes to patient: Next dose due in the morning 06/24   metoprolol tartrate 25 MG tablet Commonly known as: LOPRESSOR Take 0.5 tablets (12.5 mg total) by mouth 2 (two) times daily. Notes to patient: Next dose due this evening 06/23   mirtazapine 15 MG tablet Commonly known as: REMERON Take 1 tablet (15 mg total) by mouth at bedtime. Notes to patient: Next dose due tonight at bedtime 06/23   mycophenolate 180 MG EC tablet Commonly known as: MYFORTIC Take 360 mg by mouth 2 (two) times  daily. Notes to patient: Next dose due this evening 06/23   nystatin 100000 UNIT/ML suspension Commonly known as: MYCOSTATIN Take 5 mLs (500,000 Units total) by mouth 4 (four) times daily for 7 days. Notes to patient: Next dose due this evening 06/23   polyethylene glycol 17 g packet Commonly known as: MIRALAX / GLYCOLAX Take 17 g by mouth daily. Notes to patient: Next dose due in the morning 06/24   predniSONE 10 MG tablet Commonly known as: DELTASONE Take 1 tablet (10 mg total) by mouth daily with breakfast. Notes to patient: Next dose due in the morning with breakfast 06/24   sodium bicarbonate 650  MG tablet Take 2 tablets (1,300 mg total) by mouth 2 (two) times daily. Notes to patient: Next dose due this evening 06/23   tacrolimus 1 MG capsule Commonly known as: PROGRAF Take 1 capsule (1 mg total) by mouth 2 (two) times daily. What changed: how much to take Notes to patient: Next dose due this evening 06/23   tamsulosin 0.4 MG Caps capsule Commonly known as: FLOMAX Take 1 capsule (0.4 mg total) by mouth daily after breakfast. Notes to patient: Next does due in the morning after breakfast 06/24   traZODone 50 MG tablet Commonly known as: DESYREL Take 0.5-1 tablets (25-50 mg total) by mouth at bedtime as needed for sleep.            Discharge Care Instructions  (From admission, onward)         Start     Ordered   03/18/20 0000  Discharge wound care:       Comments: Wound care as recommended   03/18/20 1208         Allergies  Allergen Reactions  . Iodinated Diagnostic Agents Hives  . Tape Hives    Follow-up Information    [facility] Follow up.   Specialty: [place] Why: [facility] nursing/physical therapy/occupational/speech therapy Contact information: [address] [place] [address] [phone]        Drosinis, Pamalee Leyden, PA-C. Schedule an appointment as soon as possible for a visit in 1 week(s).   Specialty: Internal  Medicine Contact information: [place] [address] [phone]        Elmarie Shiley, MD Follow up.   Specialty: Nephrology Contact information: [place]  [id number] [phone]                The results of significant diagnostics from this hospitalization (including imaging, microbiology, ancillary and laboratory) are listed below for reference.    Significant Diagnostic Studies: CT ABDOMEN PELVIS WO CONTRAST  Result Date: 03/04/2020 CLINICAL DATA:  Low blood pressure with decreased oral intake. EXAM: CT ABDOMEN AND PELVIS WITHOUT CONTRAST TECHNIQUE: Multidetector CT imaging of the abdomen and pelvis was performed following the standard protocol without IV contrast. COMPARISON:  February 27, 2019 FINDINGS: Lower chest: No acute abnormality. Hepatobiliary: No focal liver abnormality is seen. Subcentimeter gallstones are seen within the gallbladder lumen without evidence of gallbladder wall thickening or biliary dilatation. Pancreas: Unremarkable. No pancreatic ductal dilatation or surrounding inflammatory changes. Spleen: Normal in size without focal abnormality. Adrenals/Urinary Tract: Adrenal glands are unremarkable. There is marked severity diffuse bilateral renal cortical thinning of the native kidneys. A stable 1.1 cm x 1.3 cm round hyperdense area is seen within the posterolateral aspect of the mid right kidney. Multiple bilateral subcentimeter non-obstructing renal stones are also seen within the native kidneys. A transplant kidney is seen within the right renal pelvis. This is normal in size without evidence of focal lesions, hydronephrosis or renal calculi. Bladder is unremarkable. Stomach/Bowel: Stomach is within normal limits. Appendix appears normal. No evidence of bowel wall thickening, distention, or inflammatory changes. Numerous noninflamed diverticula are seen throughout the large bowel. Vascular/Lymphatic: There is marked severity calcification of  the abdominal aorta. No enlarged abdominal or pelvic lymph nodes. Reproductive: The prostate gland is moderately enlarged. The area of low attenuation seen within the anterior aspect of the prostate gland on the prior study is not clearly identified on the current exam. Other: No abdominal wall hernia or abnormality. No abdominopelvic  ascites. Musculoskeletal: Multilevel degenerative changes are seen throughout the lumbar spine. IMPRESSION: 1. Cholelithiasis without evidence of acute cholecystitis. 2. Marked severity diffuse bilateral renal cortical thinning of the native kidneys with a stable 1.1 cm x 1.3 cm round hyperdense area within the posterolateral aspect of the mid right kidney which may represent a hemorrhagic cyst. 3. Colonic diverticulosis without evidence of diverticulitis. 4. Transplanted kidney within the pelvis on the right. Aortic Atherosclerosis (ICD10-I70.0). Electronically Signed   By: Virgina Norfolk M.D.   On: 03/04/2020 18:34   US Renal Transplant w/Doppler  Result Date: 03/05/2020 CLINICAL DATA:  Acute kidney injury. Previous kidney transplant. History of hypertension, diabetes. EXAM: ULTRASOUND OF RENAL TRANSPLANT WITH RENAL DOPPLER ULTRASOUND TECHNIQUE: Ultrasound examination of the renal transplant was performed with gray-scale, color and duplex doppler evaluation. COMPARISON:  01/07/2020 FINDINGS: Transplant kidney location: RLQ Transplant Kidney: Renal measurements: 10.7 x 6.3 x 6.6 cm = volume: 261mL. Normal in size and parenchymal echogenicity. No evidence of mass or hydronephrosis. No peri-transplant fluid collection seen. Color flow in the main renal artery:  Yes Color flow in the main renal vein:  Yes Duplex Doppler Evaluation: Main Renal Artery Resistive Index: 0.82 Venous waveform in main renal vein:  Present Intrarenal resistive index in upper pole:  0.75 (normal 0.6-0.8; equivocal 0.8-0.9; abnormal >= 0.9) Intrarenal resistive index in lower pole: 0.75 (normal 0.6-0.8;  equivocal 0.8-0.9; abnormal >= 0.9) Bladder: Normal for degree of bladder distention. Other findings:  Enlarged prostate IMPRESSION: 1. Stable right transplant kidney.  No hydronephrosis. 2. Prostate enlargement Electronically Signed   By: Lucrezia Europe M.D.   On: 03/05/2020 09:59   DG Chest Portable 1 View  Result Date: 03/04/2020 CLINICAL DATA:  Hypotension. EXAM: PORTABLE CHEST 1 VIEW COMPARISON:  January 11, 2020 FINDINGS: There is no evidence of acute infiltrate, pleural effusion or pneumothorax. The cardiac silhouette is mildly enlarged and unchanged in size. There is moderate severity calcification of the aortic arch. The visualized skeletal structures are unremarkable. IMPRESSION: Stable cardiomegaly without acute or active cardiopulmonary disease. Electronically Signed   By: Virgina Norfolk M.D.   On: 03/04/2020 18:25    Microbiology: Recent Results (from the past 240 hour(s))  Culture, Urine     Status: None   Collection Time: 03/10/20 11:03 AM   Specimen: Urine, Clean Catch  Result Value Ref Range Status   Specimen Description   Final    URINE, CLEAN CATCH Performed at [facility], [place] [address] [place], [place] [address]    Special Requests   Final    NONE Performed at [facility], [place] [address] [place], [place] [address]    Culture   Final    NO GROWTH Performed at [facility], [place] [address] [place], [place] [address]    Report Status 03/11/2020 FINAL  Final     Labs: Basic Metabolic Panel: Recent Labs  Lab 03/13/20 0533 03/13/20 0533 03/14/20 9147 03/15/20 8295 03/16/20 0529 03/17/20 0528 03/18/20 0658  NA 132*   < > 131* 131* 132* 133* 133*  K 3.8   < > 3.2* 3.7 4.9 4.1 4.1  CL 102   < > 102 106 103 103 102  CO2 21*   < > 20* 20* 18* 21* 22  GLUCOSE 297*   < > 212* 229* 256* 274* 279*  BUN 61*   < > 59* 58* 63* 65* 67*  CREATININE 2.60*   < > 2.63* 2.55* 2.69* 2.58* 2.79*  CALCIUM  8.3*   < > 7.9* 7.9* 8.2*  8.4* 8.3*  MG 1.3*  --  1.9  --   --   --   --   PHOS 2.7   < > 2.8 2.6 2.9 2.5 2.5   < > = values in this interval not displayed.   Liver Function Tests: Recent Labs  Lab 03/14/20 0622 03/15/20 6184 03/16/20 0529 03/17/20 0528 03/18/20 0658  ALBUMIN 1.6* 1.6* 1.4* 1.5* 1.5*   No results for input(s): LIPASE, AMYLASE in the last 168 hours. No results for input(s): AMMONIA in the last 168 hours. CBC: Recent Labs  Lab 03/13/20 0533 03/14/20 0622 03/15/20 8592 03/16/20 0529 03/17/20 0528  WBC 8.3 8.4 8.4 9.6 7.7  HGB 9.7* 8.4* 8.8* 8.2* 8.0*  HCT 30.1* 26.6* 27.9* 24.7* 25.9*  MCV 79.0* 78.5* 79.7* 76.2* 79.0*  PLT 148* 145* 144* 142* 144*   Cardiac Enzymes: No results for input(s): CKTOTAL, CKMB, CKMBINDEX, TROPONINI in the last 168 hours. BNP: BNP (last 3 results) Recent Labs    03/04/20 1628  BNP 1,037.6*    ProBNP (last 3 results) No results for input(s): PROBNP in the last 8760 hours.  CBG: Recent Labs  Lab 03/17/20 1154 03/17/20 1630 03/17/20 2051 03/18/20 0721 03/18/20 1135  GLUCAP 294* 261* 236* 267* 288*       Signed:  Alma Friendly, MD [facility] 03/18/2020, 7:58 PM

## 2020-03-18 NOTE — Plan of Care (Signed)
  Problem: Health Behavior/Discharge Planning: Goal: Ability to manage health-related needs will improve Outcome: Progressing   Problem: Clinical Measurements: Goal: Complications related to the disease process or treatment will be avoided or minimized Outcome: Progressing Goal: Dialysis access will remain free of complications Outcome: Progressing   Problem: Activity: Goal: Activity intolerance will improve Outcome: Progressing   Problem: Fluid Volume: Goal: Fluid volume balance will be maintained or improved Outcome: Progressing   Problem: Nutritional: Goal: Ability to make appropriate dietary choices will improve Outcome: Progressing   Problem: Respiratory: Goal: Respiratory symptoms related to disease process will be avoided Outcome: Progressing   Problem: Urinary Elimination: Goal: Progression of disease will be identified and treated Outcome: Progressing   Problem: Fluid Volume: Goal: Ability to maintain a balanced intake and output will improve Outcome: Progressing   Problem: Health Behavior/Discharge Planning: Goal: Ability to manage health-related needs will improve Outcome: Progressing   Problem: Metabolic: Goal: Ability to maintain appropriate glucose levels will improve Outcome: Progressing   Problem: Nutritional: Goal: Maintenance of adequate nutrition will improve Outcome: Progressing Goal: Progress toward achieving an optimal weight will improve Outcome: Progressing   Problem: Skin Integrity: Goal: Risk for impaired skin integrity will decrease Outcome: Progressing   Problem: Tissue Perfusion: Goal: Adequacy of tissue perfusion will improve Outcome: Progressing   Problem: Nutrition Goal: Patient maintains adequate hydration Outcome: Progressing Goal: Patient maintains weight Outcome: Progressing   Problem: Education: Goal: Knowledge of General Education information will improve Description: Including pain rating scale, medication(s)/side  effects and non-pharmacologic comfort measures Outcome: Progressing   Problem: Health Behavior/Discharge Planning: Goal: Ability to manage health-related needs will improve Outcome: Progressing   Problem: Clinical Measurements: Goal: Ability to maintain clinical measurements within normal limits will improve Outcome: Progressing Goal: Will remain free from infection Outcome: Progressing Goal: Diagnostic test results will improve Outcome: Progressing Goal: Respiratory complications will improve Outcome: Progressing Goal: Cardiovascular complication will be avoided Outcome: Progressing   Problem: Activity: Goal: Risk for activity intolerance will decrease Outcome: Progressing   Problem: Nutrition: Goal: Adequate nutrition will be maintained Outcome: Progressing   Problem: Coping: Goal: Level of anxiety will decrease Outcome: Progressing   Problem: Elimination: Goal: Will not experience complications related to bowel motility Outcome: Progressing Goal: Will not experience complications related to urinary retention Outcome: Progressing   Problem: Pain Managment: Goal: General experience of comfort will improve Outcome: Progressing   Problem: Safety: Goal: Ability to remain free from injury will improve Outcome: Progressing   Problem: Skin Integrity: Goal: Risk for impaired skin integrity will decrease Outcome: Progressing

## 2020-03-18 NOTE — Progress Notes (Signed)
Inpatient Diabetes Program Recommendations  AACE/ADA: New Consensus Statement on Inpatient Glycemic Control (2015)  Target Ranges:  Prepandial:   less than 140 mg/dL      Peak postprandial:   less than 180 mg/dL (1-2 hours)      Critically ill patients:  140 - 180 mg/dL   Lab Results  Component Value Date   GLUCAP 267 (H) 03/18/2020   HGBA1C 10.9 (H) 03/05/2020    Review of Glycemic Control  Blood sugars consistently > 200 mg/dL Continue to titrate insulin.  Inpatient Diabetes Program Recommendations:    Increase Lantus to 38 units QD Increase Novolog meal coverage to 5 units tidwc if pt eats > 50% meal  Will continue to follow closely.  Thank you. Lorenda Peck, RD, LDN, CDE Inpatient Diabetes Coordinator [phone]

## 2020-03-20 ENCOUNTER — Other Ambulatory Visit: Payer: Self-pay | Admitting: Physical Medicine and Rehabilitation

## 2020-03-23 ENCOUNTER — Telehealth: Payer: Self-pay | Admitting: Neurology

## 2020-03-23 ENCOUNTER — Telehealth: Payer: Self-pay

## 2020-03-23 ENCOUNTER — Ambulatory Visit: Payer: Medicare HMO | Admitting: Neurology

## 2020-03-23 NOTE — Telephone Encounter (Signed)
If these orders are related to his stroke, then we can recert, if they are related to generalized debility, then I did not see him since hospital admission.

## 2020-03-23 NOTE — Telephone Encounter (Signed)
Ryan Houseman, RN from [facility] called to get verbal orders for 1wk2, end of recert, if [name] could approve these orders then future orders can go to PCP. According to notes patient may have went back to hospital.

## 2020-03-23 NOTE — Telephone Encounter (Signed)
Just an FYI   Patient wife tried to call last week when our phones were down to reschedule. She is not able to bring him to the appointment today due to she cant get him out with his legs not working and he can't standing. She reschedule later in July after he has had PT for a Pember bit.

## 2020-03-26 ENCOUNTER — Ambulatory Visit: Payer: Medicare HMO | Admitting: Physical Medicine & Rehabilitation

## 2020-04-02 ENCOUNTER — Encounter: Payer: Medicare HMO | Admitting: Physical Medicine & Rehabilitation

## 2020-04-10 ENCOUNTER — Encounter: Payer: Self-pay | Admitting: Internal Medicine

## 2020-04-10 ENCOUNTER — Non-Acute Institutional Stay (SKILLED_NURSING_FACILITY): Payer: Medicare HMO | Admitting: Internal Medicine

## 2020-04-10 DIAGNOSIS — E1169 Type 2 diabetes mellitus with other specified complication: Secondary | ICD-10-CM | POA: Diagnosis not present

## 2020-04-10 DIAGNOSIS — E785 Hyperlipidemia, unspecified: Secondary | ICD-10-CM

## 2020-04-10 DIAGNOSIS — R7989 Other specified abnormal findings of blood chemistry: Secondary | ICD-10-CM | POA: Diagnosis not present

## 2020-04-10 DIAGNOSIS — E559 Vitamin D deficiency, unspecified: Secondary | ICD-10-CM | POA: Insufficient documentation

## 2020-04-10 DIAGNOSIS — Z940 Kidney transplant status: Secondary | ICD-10-CM

## 2020-04-10 DIAGNOSIS — I482 Chronic atrial fibrillation, unspecified: Secondary | ICD-10-CM

## 2020-04-10 DIAGNOSIS — I69320 Aphasia following cerebral infarction: Secondary | ICD-10-CM | POA: Insufficient documentation

## 2020-04-10 DIAGNOSIS — D631 Anemia in chronic kidney disease: Secondary | ICD-10-CM | POA: Insufficient documentation

## 2020-04-10 DIAGNOSIS — N2581 Secondary hyperparathyroidism of renal origin: Secondary | ICD-10-CM | POA: Insufficient documentation

## 2020-04-10 DIAGNOSIS — E1165 Type 2 diabetes mellitus with hyperglycemia: Secondary | ICD-10-CM | POA: Diagnosis not present

## 2020-04-10 DIAGNOSIS — L89152 Pressure ulcer of sacral region, stage 2: Secondary | ICD-10-CM | POA: Insufficient documentation

## 2020-04-10 DIAGNOSIS — N19 Unspecified kidney failure: Secondary | ICD-10-CM

## 2020-04-10 DIAGNOSIS — N189 Chronic kidney disease, unspecified: Secondary | ICD-10-CM

## 2020-04-10 NOTE — Progress Notes (Signed)
Provider:  Rexene Edison. Mariea Clonts, D.O., C.M.D. Location:  [place] Room Number: [place] of Service:  SNF (31)  PCP: Drosinis, Pamalee Leyden, PA-C Patient Care Team: Drosinis, Pamalee Leyden, PA-C as PCP - General (Internal Medicine) Altheimer, Legrand Como, MD as Referring Physician (Endocrinology)  Extended Emergency Contact Information Primary Emergency Contact: B[place] Mobile Phone: [phone] Relation: Spouse  Code Status: full code Goals of Care: Advanced Directive information Advanced Directives 04/10/2020  Does Patient Have a Medical Advance Directive? Yes  Does patient want to make changes to medical advance directive? No - Patient declined  Would patient like information on creating a medical advance directive? -   Chief Complaint  Patient presents with  . New Admit To SNF    New Admit to SNF     HPI: Patient is a 69 y.o. male seen today for admission to [facility] s/p hospitalization at [facility] from 6/30-7/15/21.  Mr. Lyerly has a history of hypertension, hyperlipidemia, A. fib on Eliquis, type 2 diabetes, previous stroke with expressive aphasia, and end-stage renal disease status post renal transplant in two thousand fourteen.  He had been admitted to [facility] from 6/9 to 03/18/2020 for acute kidney injury.  His baseline creatinine is around one but had risen to 2.9 and at discharge his creatinine was 2.3 he was later seen at his nephrology office and found to have worsening creatinine of more than three on June 30.  He had had no vomiting diarrhea, new medications, antibiotics, contrast, chest pain, shortness of breath, orthopnea, PND.  He has chronic dysphagia with poor p.o. intake and a significant cognitive deficit.  In the ED at [facility]'s vital signs were fairly normal with mild hypertension and and heart rate of ninety-nine.  His hemoglobin was 8.7 down from 11.22 months before.  Creatinine was 3.47.  He had no signs of volume overload  potassium and bicarb were normal.  Was unclear if he was having prerenal azotemia versus rejection.  He was put on strict I's and O's daily weights and creatinine trended.  Medications were renally dosed.  Was tested for CMV EBV and BK virus.  His immunosuppressants were continued and levels checked.  He did have mild swelling of his extremities and lower extremity duplex was done to rule out DVT.  He was found to have a stage II sacral pressure injury at admission to hospital.  For his chronic atrial fibrillation his Eliquis was held at first in case he needed a biopsy to rule out rejection.    His acute kidney injury did resolve by the time of discharge July 15 with creatinine down to 1.10.  He had an ultrasound of his renal transplanted right kidney which was negative for allograft hydronephrosis.  EBV CMV and BK virus were all negative.  He is on a 1500 cc fluid restriction.  He remains on mycophenolate tacrolimus and prednisone.  He has chronic hypoalbuminemia.  His magnesium was replaced and he is on a twice daily supplement, sodium bicarb 3 times daily and Sensipar.  He was continued on his statin therapy for hyperlipidemia.  The anemia was felt to be due to chronic renal failure however iron panel K44 and folic acid were checked.  On July 8 he was transfused 1 unit of packed red cells for hemoglobin of 7.1 with a repeat of 7.3.  My night he received another unit of packed red cells and afterward hemoglobin was 8.2.  He has chronic A. fib  on metoprolol twice daily.  Blood pressure appears to be well controlled.  In terms of his type 2 diabetes his last A1c was 9.9 on July 2.  Several of his meds have been held for diabetes during his hospitalization but upon discharge his home Januvia was resumed along with Lantus 25 units daily which can be titrated to effect.  He did have vitamin D deficiency with a level of 14.  He is on cholecalciferol 2000 units daily.  Recheck was recommended in 3  months.  He had a stage II pressure injury to his sacrum noted to be present on admission to the hospital.  Frequent turning and offloading was recommended along with Mepilex dressing.  He is on 80 mg of Lipitor and last LDL in March was 165.  Covid testing was negative and he has had both Covid vaccines-Pfizer February 11 and March 8.  Medications that were stopped during his hospitalization include nystatin mouthwash, Voltaren gel, Metformin 500 mg extended release tablet, Starlix, Actos.  He is to f/u with his nephrologist, Dr. Heron Nay in [facility].  When seen, he had no complaints and reported feeling much better.  He seems to answer yes to many questions as part of his aphasia.  He was very pleasant and sociable.    Past Medical History:  Diagnosis Date  . Allergic rhinitis   . BPH (benign prostatic hyperplasia)   . Chronic atrial fibrillation ([place])   . Diabetes mellitus ([place])   . DVT (deep venous thrombosis) (HCC)    LLE  . Hematochezia   . HTN (hypertension)   . Hypomagnesemia 03/20/2019  . Kidney transplant recipient   . PVD (peripheral vascular disease) ([place])    Past Surgical History:  Procedure Laterality Date  . KIDNEY TRANSPLANT      Social History   Socioeconomic History  . Marital status: Married    Spouse name: Not on file  . Number of children: Not on file  . Years of education: Not on file  . Highest education level: Not on file  Occupational History  . Not on file  Tobacco Use  . Smoking status: Former Smoker    Types: Cigarettes    Quit date: 03/07/1984    Years since quitting: 36.1  . Smokeless tobacco: Never Used  . Tobacco comment: N/A  Vaping Use  . Vaping Use: Never used  Substance and Sexual Activity  . Alcohol use: Not Currently    Comment: rare use  . Drug use: Never  . Sexual activity: Yes  Other Topics Concern  . Not on file  Social History Narrative  . Not on file   Social Determinants of Health   Financial Resource  Strain:   . Difficulty of Paying Living Expenses:   Food Insecurity:   . Worried About Charity fundraiser in the Last Year:   . Arboriculturist in the Last Year:   Transportation Needs:   . Film/video editor (Medical):   Marland Kitchen Lack of Transportation (Non-Medical):   Physical Activity:   . Days of Exercise per Week:   . Minutes of Exercise per Session:   Stress:   . Feeling of Stress :   Social Connections:   . Frequency of Communication with Friends and Family:   . Frequency of Social Gatherings with Friends and Family:   . Attends Religious Services:   . Active Member of Clubs or Organizations:   . Attends Archivist Meetings:   Marland Kitchen Marital  Status:     reports that he quit smoking about 36 years ago. His smoking use included cigarettes. He has never used smokeless tobacco. He reports previous alcohol use. He reports that he does not use drugs.  Functional Status Survey:    Family History  Problem Relation Age of Onset  . Stroke Mother   . Heart disease Father   . Diabetes Father   . Diabetes Sister   . Diabetes Brother   . Heart disease Brother     Health Maintenance  Topic Date Due  . Hepatitis C Screening  Never done  . FOOT EXAM  Never done  . OPHTHALMOLOGY EXAM  Never done  . TETANUS/TDAP  Never done  . COLONOSCOPY  Never done  . INFLUENZA VACCINE  04/26/2020  . HEMOGLOBIN A1C  09/04/2020  . PNA vac Low Risk Adult (2 of 2 - PCV13) 04/09/2021  . COVID-19 Vaccine  Completed    Allergies  Allergen Reactions  . Iodinated Diagnostic Agents Hives  . Tape Hives    Outpatient Encounter Medications as of 04/10/2020  Medication Sig  . acetaminophen (TYLENOL) 325 MG tablet Take 1-2 tablets (325-650 mg total) by mouth every 4 (four) hours as needed for mild pain.  Marland Kitchen apixaban (ELIQUIS) 5 MG TABS tablet Take 1 tablet (5 mg total) by mouth 2 (two) times daily.  Marland Kitchen artificial tears (LACRILUBE) OINT ophthalmic ointment Place into the left eye at bedtime as  needed for dry eyes.  Marland Kitchen atorvastatin (LIPITOR) 80 MG tablet Take 1 tablet (80 mg total) by mouth daily at 6 PM.  . brimonidine (ALPHAGAN) 0.2 % ophthalmic solution Place 1 drop into the right eye 2 (two) times daily.  . calcium-vitamin D (OSCAL WITH D) 500-200 MG-UNIT tablet Take 1 tablet by mouth.  . cinacalcet (SENSIPAR) 90 MG tablet Take 90 mg by mouth daily.  . dorzolamide-timolol (COSOPT) 22.3-6.8 MG/ML ophthalmic solution Place 1 drop into the right eye 2 (two) times a day.  . dronabinol (MARINOL) 5 MG capsule Take 1 capsule (5 mg total) by mouth 2 (two) times daily before lunch and supper.  . famotidine (PEPCID) 20 MG tablet Take 1 tablet (20 mg total) by mouth daily.  . finasteride (PROSCAR) 5 MG tablet Take 1 tablet (5 mg total) by mouth daily.  . folic acid (FOLVITE) 1 MG tablet TAKE 1 TABLET BY MOUTH EVERY DAY  . hydrALAZINE (APRESOLINE) 25 MG tablet Take 1 tablet (25 mg total) by mouth every 6 (six) hours.  . insulin glargine (LANTUS) 100 UNIT/ML Solostar Pen Inject 35 Units into the skin daily.  . Insulin Pen Needle 32G X 6 MM MISC 1 application by Does not apply route daily.  Marland Kitchen JANUVIA 100 MG tablet Take 100 mg by mouth daily.  . magnesium oxide (MAG-OX) 400 MG tablet Take 1 tablet (400 mg total) by mouth daily.  . metoprolol tartrate (LOPRESSOR) 25 MG tablet Take 0.5 tablets (12.5 mg total) by mouth 2 (two) times daily.  . mirtazapine (REMERON) 15 MG tablet Take 1 tablet (15 mg total) by mouth at bedtime.  . Multiple Vitamins-Minerals (MULTIVITAMINS THER. W/MINERALS) TABS tablet Take 1 tablet by mouth daily.  . mycophenolate (MYFORTIC) 180 MG EC tablet Take 360 mg by mouth 2 (two) times daily.   Marland Kitchen NIFEdipine (ADALAT CC) 60 MG 24 hr tablet Take 60 mg by mouth daily.  . Nutritional Supplements (FEEDING SUPPLEMENT, NEPRO CARB STEADY,) LIQD Take 237 mLs by mouth 2 (two) times daily between meals.  Marland Kitchen  polyethylene glycol (MIRALAX / GLYCOLAX) 17 g packet Take 17 g by mouth daily.  .  predniSONE (DELTASONE) 10 MG tablet Take 1 tablet (10 mg total) by mouth daily with breakfast.  . sodium bicarbonate 650 MG tablet Take 2 tablets (1,300 mg total) by mouth 2 (two) times daily.  . tacrolimus (PROGRAF) 1 MG capsule Take 1 capsule (1 mg total) by mouth 2 (two) times daily.  . tamsulosin (FLOMAX) 0.4 MG CAPS capsule Take 1 capsule (0.4 mg total) by mouth daily after breakfast.  . traZODone (DESYREL) 50 MG tablet Take 0.5-1 tablets (25-50 mg total) by mouth at bedtime as needed for sleep.   No facility-administered encounter medications on file as of 04/10/2020.    Review of Systems  Constitutional: Negative for chills, fever and malaise/fatigue.  HENT: Negative for congestion, hearing loss and sore throat.   Eyes: Negative for blurred vision.  Respiratory: Negative for cough and shortness of breath.   Cardiovascular: Negative for chest pain, palpitations and leg swelling.       Left forearm HD access  Gastrointestinal: Negative for abdominal pain, blood in stool, constipation, diarrhea and melena.  Genitourinary: Negative for dysuria.  Musculoskeletal: Negative for falls and joint pain.  Skin: Negative for itching and rash.  Neurological: Positive for speech change and weakness. Negative for dizziness.  Psychiatric/Behavioral: Positive for memory loss. Negative for depression. The patient is not nervous/anxious and does not have insomnia.     Vitals:   04/10/20 1422  BP: 138/78  Pulse: 75  Temp: 98.2 F (36.8 C)  SpO2: 99%  Weight: 175 lb (79.4 kg)  Height: 5\' 11"  (1.803 m)   Body mass index is 24.41 kg/m. Physical Exam Vitals reviewed.  Constitutional:      General: He is not in acute distress.    Appearance: Normal appearance. He is not toxic-appearing.  HENT:     Head: Normocephalic and atraumatic.     Right Ear: External ear normal.     Left Ear: External ear normal.     Nose: Nose normal.     Mouth/Throat:     Pharynx: Oropharynx is clear.  Eyes:      Extraocular Movements: Extraocular movements intact.     Conjunctiva/sclera: Conjunctivae normal.     Pupils: Pupils are equal, round, and reactive to light.  Cardiovascular:     Rate and Rhythm: Normal rate and regular rhythm.     Heart sounds: Murmur heard.      Comments: Left forearm HD access with audible pulse and thrill Pulmonary:     Effort: Pulmonary effort is normal.     Breath sounds: Normal breath sounds. No rales.  Abdominal:     General: Bowel sounds are normal. There is no distension.     Palpations: Abdomen is soft.     Tenderness: There is no abdominal tenderness. There is no guarding or rebound.  Musculoskeletal:     Cervical back: Neck supple.     Right lower leg: No edema.     Left lower leg: No edema.  Lymphadenopathy:     Cervical: No cervical adenopathy.  Skin:    General: Skin is warm and dry.  Neurological:     General: No focal deficit present.     Mental Status: He is alert.     Motor: Weakness present.     Comments: Now using walker--reports walking unassisted prior to admission (?), has aphasia  Psychiatric:        Mood and Affect: Mood  normal.        Behavior: Behavior normal.     Labs reviewed: Basic Metabolic Panel: Recent Labs    01/08/20 0348 01/09/20 0348 03/13/20 0533 03/13/20 0533 03/14/20 0622 03/15/20 5852 03/16/20 0529 03/17/20 0528 03/18/20 0658  NA 131*   < > 132*   < > 131*   < > 132* 133* 133*  K 4.9   < > 3.8   < > 3.2*   < > 4.9 4.1 4.1  CL 102   < > 102   < > 102   < > 103 103 102  CO2 21*   < > 21*   < > 20*   < > 18* 21* 22  GLUCOSE 226*   < > 297*   < > 212*   < > 256* 274* 279*  BUN 52*   < > 61*   < > 59*   < > 63* 65* 67*  CREATININE 1.26*   < > 2.60*   < > 2.63*   < > 2.69* 2.58* 2.79*  CALCIUM 8.9   < > 8.3*   < > 7.9*   < > 8.2* 8.4* 8.3*  MG 1.8  --  1.3*  --  1.9  --   --   --   --   PHOS  --    < > 2.7   < > 2.8   < > 2.9 2.5 2.5   < > = values in this interval not displayed.   Liver Function  Tests: Recent Labs    01/11/20 0802 01/11/20 0802 01/24/20 0449 01/24/20 0449 03/04/20 1628 03/08/20 0943 03/16/20 0529 03/17/20 0528 03/18/20 0658  AST 24  --  15  --  12*  --   --   --   --   ALT 59*  --  19  --  13  --   --   --   --   ALKPHOS 82  --  54  --  61  --   --   --   --   BILITOT 0.9  --  0.7  --  1.1  --   --   --   --   PROT 4.4*  --  4.1*  --  6.6  --   --   --   --   ALBUMIN 1.9*   < > 1.7*   < > 2.4*   < > 1.4* 1.5* 1.5*   < > = values in this interval not displayed.   Recent Labs    03/04/20 1738  LIPASE 44   No results for input(s): AMMONIA in the last 8760 hours. CBC: Recent Labs    01/24/20 0449 01/24/20 0449 03/04/20 1628 03/04/20 1748 03/07/20 0352 03/10/20 0531 03/15/20 0633 03/16/20 0529 03/17/20 0528  WBC 2.8*   < > 16.4*   < > 10.6*   < > 8.4 9.6 7.7  NEUTROABS 2.1  --  15.3*  --  9.4*  --   --   --   --   HGB 10.1*   < > 11.5*   < > 10.2*   < > 8.8* 8.2* 8.0*  HCT 31.2*   < > 36.0*   < > 32.8*   < > 27.9* 24.7* 25.9*  MCV 83.4   < > 80.9   < > 79.6*   < > 79.7* 76.2* 79.0*  PLT 81*   < > 174   < > 123*   < >  144* 142* 144*   < > = values in this interval not displayed.   Cardiac Enzymes: No results for input(s): CKTOTAL, CKMB, CKMBINDEX, TROPONINI in the last 8760 hours. BNP: Invalid input(s): POCBNP Lab Results  Component Value Date   HGBA1C 10.9 (H) 03/05/2020   No results found for: TSH No results found for: VITAMINB12 No results found for: FOLATE Lab Results  Component Value Date   FERRITIN 344 (H) 02/28/2019    Imaging and Procedures obtained prior to SNF admission: CT ABDOMEN PELVIS WO CONTRAST  Result Date: 03/04/2020 CLINICAL DATA:  Low blood pressure with decreased oral intake. EXAM: CT ABDOMEN AND PELVIS WITHOUT CONTRAST TECHNIQUE: Multidetector CT imaging of the abdomen and pelvis was performed following the standard protocol without IV contrast. COMPARISON:  February 27, 2019 FINDINGS: Lower chest: No acute  abnormality. Hepatobiliary: No focal liver abnormality is seen. Subcentimeter gallstones are seen within the gallbladder lumen without evidence of gallbladder wall thickening or biliary dilatation. Pancreas: Unremarkable. No pancreatic ductal dilatation or surrounding inflammatory changes. Spleen: Normal in size without focal abnormality. Adrenals/Urinary Tract: Adrenal glands are unremarkable. There is marked severity diffuse bilateral renal cortical thinning of the native kidneys. A stable 1.1 cm x 1.3 cm round hyperdense area is seen within the posterolateral aspect of the mid right kidney. Multiple bilateral subcentimeter non-obstructing renal stones are also seen within the native kidneys. A transplant kidney is seen within the right renal pelvis. This is normal in size without evidence of focal lesions, hydronephrosis or renal calculi. Bladder is unremarkable. Stomach/Bowel: Stomach is within normal limits. Appendix appears normal. No evidence of bowel wall thickening, distention, or inflammatory changes. Numerous noninflamed diverticula are seen throughout the large bowel. Vascular/Lymphatic: There is marked severity calcification of the abdominal aorta. No enlarged abdominal or pelvic lymph nodes. Reproductive: The prostate gland is moderately enlarged. The area of low attenuation seen within the anterior aspect of the prostate gland on the prior study is not clearly identified on the current exam. Other: No abdominal wall hernia or abnormality. No abdominopelvic ascites. Musculoskeletal: Multilevel degenerative changes are seen throughout the lumbar spine. IMPRESSION: 1. Cholelithiasis without evidence of acute cholecystitis. 2. Marked severity diffuse bilateral renal cortical thinning of the native kidneys with a stable 1.1 cm x 1.3 cm round hyperdense area within the posterolateral aspect of the mid right kidney which may represent a hemorrhagic cyst. 3. Colonic diverticulosis without evidence of  diverticulitis. 4. Transplanted kidney within the pelvis on the right. Aortic Atherosclerosis (ICD10-I70.0). Electronically Signed   By: Virgina Norfolk M.D.   On: 03/04/2020 18:34   US Renal Transplant w/Doppler  Result Date: 03/05/2020 CLINICAL DATA:  Acute kidney injury. Previous kidney transplant. History of hypertension, diabetes. EXAM: ULTRASOUND OF RENAL TRANSPLANT WITH RENAL DOPPLER ULTRASOUND TECHNIQUE: Ultrasound examination of the renal transplant was performed with gray-scale, color and duplex doppler evaluation. COMPARISON:  01/07/2020 FINDINGS: Transplant kidney location: RLQ Transplant Kidney: Renal measurements: 10.7 x 6.3 x 6.6 cm = volume: 265mL. Normal in size and parenchymal echogenicity. No evidence of mass or hydronephrosis. No peri-transplant fluid collection seen. Color flow in the main renal artery:  Yes Color flow in the main renal vein:  Yes Duplex Doppler Evaluation: Main Renal Artery Resistive Index: 0.82 Venous waveform in main renal vein:  Present Intrarenal resistive index in upper pole:  0.75 (normal 0.6-0.8; equivocal 0.8-0.9; abnormal >= 0.9) Intrarenal resistive index in lower pole: 0.75 (normal 0.6-0.8; equivocal 0.8-0.9; abnormal >= 0.9) Bladder: Normal for degree of bladder distention. Other  findings:  Enlarged prostate IMPRESSION: 1. Stable right transplant kidney.  No hydronephrosis. 2. Prostate enlargement Electronically Signed   By: Lucrezia Europe M.D.   On: 03/05/2020 09:59   DG Chest Portable 1 View  Result Date: 03/04/2020 CLINICAL DATA:  Hypotension. EXAM: PORTABLE CHEST 1 VIEW COMPARISON:  January 11, 2020 FINDINGS: There is no evidence of acute infiltrate, pleural effusion or pneumothorax. The cardiac silhouette is mildly enlarged and unchanged in size. There is moderate severity calcification of the aortic arch. The visualized skeletal structures are unremarkable. IMPRESSION: Stable cardiomegaly without acute or active cardiopulmonary disease. Electronically  Signed   By: Virgina Norfolk M.D.   On: 03/04/2020 18:25    Assessment/Plan 1. Acute prerenal azotemia -intake was poor and he apparently got very dry -is on both remeron and dronabinol to encourage po intake -f/u bmp  2. Renal transplant, status post -cont prednisone, tacrolimus, mycophenolate  3. Controlled type 2 diabetes mellitus with hyperglycemia, without long-term current use of insulin (HCC) -continues on lantus 35 units daily and reduce januvia dose to 25mg  daily due to his GFR  4. Hyperlipidemia associated with type 2 diabetes mellitus ([place]) -continue lipitor  5. Anemia in chronic kidney disease, unspecified CKD stage -f/u cbc  6. Atrial fibrillation, chronic (HCC) -rate controlled with lopressor, not on anticoagulation pills  7. Hypomagnesemia -repleted, f/u lab  8. Pressure injury of sacral region, stage 2 ([place]) -continue wound care per wound care nurse and physician team  9. Secondary hyperparathyroidism of renal origin ([place]) -cont treatment per nephrology  10. Aphasia as late effect of stroke -here for PT, OT, ST due to weakness, stroke was not this past hospitalization  11. Vitamin D deficiency -cont repletion and recheck was recommended   Family/ staff Communication: discussed with snf nurse  Labs/tests ordered: cbc, bmp, mg, phos  Ceria Suminski L. Kalifa Cadden, D.O. [facility] [address], [place] [address] Cell Phone (Mon-Fri 8am-5pm):  [phone] On Call:  [phone] & follow prompts after 5pm & weekends Office Phone:  [phone] Office Fax:  [phone]

## 2020-04-12 MED ORDER — SITAGLIPTIN PHOSPHATE 25 MG PO TABS
25.0000 mg | ORAL_TABLET | Freq: Every day | ORAL | 3 refills | Status: DC
Start: 2020-04-12 — End: 2020-04-16

## 2020-04-15 LAB — COMPREHENSIVE METABOLIC PANEL
Calcium: 9.3 (ref 8.7–10.7)
GFR calc Af Amer: 73.7
GFR calc non Af Amer: 63.59

## 2020-04-15 LAB — CBC AND DIFFERENTIAL
HCT: 26 — AB (ref 41–53)
Hemoglobin: 8.2 — AB (ref 13.5–17.5)
Platelets: 206 (ref 150–399)
WBC: 6.1

## 2020-04-15 LAB — CBC: RBC: 3.18 — AB (ref 3.87–5.11)

## 2020-04-15 LAB — BASIC METABOLIC PANEL
BUN: 25 — AB (ref 4–21)
CO2: 19 (ref 13–22)
Chloride: 105 (ref 99–108)
Creatinine: 1.2 (ref 0.6–1.3)
Glucose: 145
Potassium: 4.9 (ref 3.4–5.3)
Sodium: 135 — AB (ref 137–147)

## 2020-04-16 ENCOUNTER — Non-Acute Institutional Stay (SKILLED_NURSING_FACILITY): Payer: Medicare HMO | Admitting: Family

## 2020-04-16 ENCOUNTER — Encounter: Payer: Self-pay | Admitting: Family

## 2020-04-16 DIAGNOSIS — E871 Hypo-osmolality and hyponatremia: Secondary | ICD-10-CM | POA: Diagnosis not present

## 2020-04-16 DIAGNOSIS — N189 Chronic kidney disease, unspecified: Secondary | ICD-10-CM | POA: Diagnosis not present

## 2020-04-16 DIAGNOSIS — D631 Anemia in chronic kidney disease: Secondary | ICD-10-CM | POA: Diagnosis not present

## 2020-04-16 NOTE — Progress Notes (Signed)
Location:    [facility]   [facility] Room Number: 638-V Place of Service:  SNF (31) Provider:  Marlowe Sax, NP    Patient Care Team: Drosinis, Pamalee Leyden, PA-C as PCP - General (Internal Medicine) Altheimer, Legrand Como, MD as Referring Physician (Endocrinology)  Extended Emergency Contact Information Primary Emergency Contact: [place] Mobile Phone: [phone] Relation: Spouse  Code Status:  FULL CODE Goals of care: Advanced Directive information Advanced Directives 04/16/2020  Does Patient Have a Medical Advance Directive? Yes  Does patient want to make changes to medical advance directive? No - Patient declined  Would patient like information on creating a medical advance directive? -     Chief Complaint  Patient presents with  . Acute Visit    Abnormal Labs.    HPI:  Pt is a 69 y.o. male seen today for an acute visit for abnormal lab results.He is seen up in the bed.He has a medical history of Type 2 DM,Hypertension,hyperlipidemia,Afib on Eliquis,ESRD post renal transplant in 2014,hx of stroke with aphasia, He denies any acute issues.His recent Hgb 8.2,HCT 25.9,CR 1.16,BUN 25.2 ,Na+ 135  And Mg 1.5 ( 04/15/2020).His post hospital discharge hgb was 8.2 post PRBC transfusion.His anemia was thought to be due to chronic renal failure. previous Hgb 7.1 >7.3 > 8.2 he denies any  Headache,dizziness,fatigue or shortness of breath. His CR is at baseline. Currently on magnesium supplement once daily.    Past Medical History:  Diagnosis Date  . Allergic rhinitis   . BPH (benign prostatic hyperplasia)   . Chronic atrial fibrillation ([place])   . Diabetes mellitus ([place])   . DVT (deep venous thrombosis) ([facility])    LLE  . Hematochezia   . HTN (hypertension)   . Hypomagnesemia 03/20/2019  . Kidney transplant recipient   . PVD (peripheral vascular disease) ([place])    Past Surgical History:  Procedure Laterality Date  . KIDNEY TRANSPLANT      Allergies    Allergen Reactions  . Iodinated Diagnostic Agents Hives  . Tape Hives    Allergies as of 04/16/2020      Reactions   Iodinated Diagnostic Agents Hives   Tape Hives      Medication List       Accurate as of April 16, 2020 12:12 PM. If you have any questions, ask your nurse or doctor.        STOP taking these medications   multivitamins ther. w/minerals Tabs tablet Stopped by: Nelda Bucks Adiel Erney, NP   polyethylene glycol 17 g packet Commonly known as: MIRALAX / GLYCOLAX Stopped by: Sandrea Hughs, NP     TAKE these medications   acetaminophen 325 MG tablet Commonly known as: TYLENOL Take 1-2 tablets (325-650 mg total) by mouth every 4 (four) hours as needed for mild pain.   apixaban 5 MG Tabs tablet Commonly known as: ELIQUIS Take 1 tablet (5 mg total) by mouth 2 (two) times daily.   artificial tears Oint ophthalmic ointment Commonly known as: LACRILUBE Place into the left eye at bedtime as needed for dry eyes.   atorvastatin 80 MG tablet Commonly known as: LIPITOR Take 1 tablet (80 mg total) by mouth daily at 6 PM.   brimonidine 0.2 % ophthalmic solution Commonly known as: ALPHAGAN Place 1 drop into the right eye 2 (two) times daily.   calcium-vitamin D 500-200 MG-UNIT tablet Commonly known as: OSCAL WITH D Take 1 tablet by mouth.   cinacalcet 60 MG tablet Commonly known as: SENSIPAR Take  60 mg by mouth every morning. With Breakfast. What changed: Another medication with the same name was removed. Continue taking this medication, and follow the directions you see here. Changed by: Sandrea Hughs, NP   dorzolamide-timolol 22.3-6.8 MG/ML ophthalmic solution Commonly known as: COSOPT Place 1 drop into the right eye 2 (two) times a day.   dronabinol 5 MG capsule Commonly known as: MARINOL Take 1 capsule (5 mg total) by mouth 2 (two) times daily before lunch and supper.   famotidine 10 MG tablet Commonly known as: PEPCID Take 10 mg by mouth daily. What  changed: Another medication with the same name was removed. Continue taking this medication, and follow the directions you see here. Changed by: Sandrea Hughs, NP   feeding supplement (NEPRO CARB STEADY) Liqd Take 237 mLs by mouth 2 (two) times daily between meals.   finasteride 5 MG tablet Commonly known as: PROSCAR Take 1 tablet (5 mg total) by mouth daily.   folic acid 1 MG tablet Commonly known as: FOLVITE TAKE 1 TABLET BY MOUTH EVERY DAY   hydrALAZINE 25 MG tablet Commonly known as: APRESOLINE Take 25 mg by mouth in the morning, at noon, in the evening, and at bedtime.   insulin glargine 100 UNIT/ML Solostar Pen Commonly known as: LANTUS Inject 35 Units into the skin daily.   Insulin Pen Needle 32G X 6 MM Misc 1 application by Does not apply route daily.   magnesium oxide 400 MG tablet Commonly known as: MAG-OX Take 1 tablet (400 mg total) by mouth daily.   metoprolol tartrate 25 MG tablet Commonly known as: LOPRESSOR Take 0.5 tablets (12.5 mg total) by mouth 2 (two) times daily.   mirtazapine 15 MG tablet Commonly known as: REMERON Take 1 tablet (15 mg total) by mouth at bedtime.   mycophenolate 180 MG EC tablet Commonly known as: MYFORTIC Take 360 mg by mouth 2 (two) times daily.   NIFEdipine 60 MG 24 hr tablet Commonly known as: ADALAT CC Take 60 mg by mouth daily.   predniSONE 2.5 MG tablet Commonly known as: DELTASONE Take 2.5 mg by mouth 3 (three) times daily. What changed: Another medication with the same name was removed. Continue taking this medication, and follow the directions you see here. Changed by: Sandrea Hughs, NP   sitaGLIPtin 100 MG tablet Commonly known as: JANUVIA Take 100 mg by mouth daily. What changed: Another medication with the same name was removed. Continue taking this medication, and follow the directions you see here. Changed by: Sandrea Hughs, NP   sodium bicarbonate 650 MG tablet Take 2 tablets (1,300 mg total) by  mouth 2 (two) times daily.   SULFAMETHOXAZOLE-TMP DS PO Take 1 tablet by mouth. Monday, Wednesday, and Friday.   tacrolimus 0.5 MG capsule Commonly known as: PROGRAF Take 1.5 mg by mouth daily.   tacrolimus 1 MG capsule Commonly known as: PROGRAF Take 1 capsule (1 mg total) by mouth 2 (two) times daily.   tamsulosin 0.4 MG Caps capsule Commonly known as: FLOMAX Take 0.4 mg by mouth every morning. What changed: Another medication with the same name was removed. Continue taking this medication, and follow the directions you see here. Changed by: Sandrea Hughs, NP   traZODone 50 MG tablet Commonly known as: DESYREL Take 0.5-1 tablets (25-50 mg total) by mouth at bedtime as needed for sleep.       Review of Systems  Constitutional: Negative for appetite change, chills, fatigue and fever.  Respiratory:  Negative for cough, chest tightness, shortness of breath and wheezing.   Cardiovascular: Negative for chest pain, palpitations and leg swelling.  Gastrointestinal: Negative for abdominal distention, abdominal pain, constipation, diarrhea, nausea and vomiting.  Skin: Negative for color change, pallor and rash.  Neurological: Positive for weakness. Negative for dizziness, seizures, light-headedness and headaches.       Aphasia   Hematological: Does not bruise/bleed easily.  Psychiatric/Behavioral: Negative for agitation and sleep disturbance. The patient is not nervous/anxious.     Immunization History  Administered Date(s) Administered  . Fluad Quad(high Dose 65+) 05/28/2019  . Influenza, High Dose Seasonal PF 06/30/2017, 07/13/2018, 07/13/2018, 05/04/2019, 05/28/2019  . Influenza-Unspecified 07/22/2016, 08/30/2016  . PFIZER SARS-COV-2 Vaccination 11/07/2019, 12/02/2019  . Pneumococcal Polysaccharide-23 06/20/2005, 04/09/2020   Pertinent  Health Maintenance Due  Topic Date Due  . FOOT EXAM  Never done  . OPHTHALMOLOGY EXAM  Never done  . COLONOSCOPY  Never done  .  INFLUENZA VACCINE  04/26/2020  . HEMOGLOBIN A1C  09/04/2020  . PNA vac Low Risk Adult (2 of 2 - PCV13) 04/09/2021   Fall Risk  02/13/2020 07/26/2019 06/27/2019 05/10/2019  Falls in the past year? 0 1 0 0  Number falls in past yr: - 0 - -  Injury with Fall? - 1 - -    Vitals:   04/16/20 1106  BP: (!) 132/77  Pulse: 81  Resp: 18  Temp: (!) 97.5 F (36.4 C)  SpO2: 99%  Weight: 175 lb (79.4 kg)  Height: 5\' 11"  (1.803 m)   Body mass index is 24.41 kg/m. Physical Exam Constitutional:      General: He is not in acute distress.    Appearance: He is not ill-appearing.     Comments: Alert and oriented x 2   HENT:     Head: Normocephalic.  Eyes:     General: No scleral icterus.       Right eye: No discharge.        Left eye: No discharge.     Conjunctiva/sclera: Conjunctivae normal.     Pupils: Pupils are equal, round, and reactive to light.  Cardiovascular:     Rate and Rhythm: Normal rate and regular rhythm.     Heart sounds: Murmur heard.   Pulmonary:     Effort: Pulmonary effort is normal. No respiratory distress.     Breath sounds: Normal breath sounds. No wheezing, rhonchi or rales.  Chest:     Chest wall: No tenderness.  Abdominal:     General: Bowel sounds are normal. There is no distension.     Palpations: Abdomen is soft. There is no mass.     Tenderness: There is no abdominal tenderness. There is no right CVA tenderness, left CVA tenderness, guarding or rebound.  Musculoskeletal:        General: No swelling or tenderness.     Right lower leg: No edema.     Left lower leg: No edema.     Comments: Unsteady gait   Skin:    General: Skin is warm.     Coloration: Skin is not pale.     Findings: No bruising or erythema.     Comments: Sacral wound not visualized   Neurological:     Mental Status: Mental status is at baseline.     Gait: Gait abnormal.     Comments: Aphasia      Labs reviewed: Recent Labs    01/08/20 0348 01/09/20 0348 03/13/20 0533  03/13/20 0533 03/14/20 8850  03/15/20 5929 03/16/20 0529 03/16/20 0529 03/17/20 0528 03/18/20 0658 04/15/20 0000  NA 131*   < > 132*   < > 131*   < > 132*   < > 133* 133* 135*  K 4.9   < > 3.8   < > 3.2*   < > 4.9   < > 4.1 4.1 4.9  CL 102   < > 102   < > 102   < > 103   < > 103 102 105  CO2 21*   < > 21*   < > 20*   < > 18*   < > 21* 22 19  GLUCOSE 226*   < > 297*   < > 212*   < > 256*  --  274* 279*  --   BUN 52*   < > 61*   < > 59*   < > 63*   < > 65* 67* 25*  CREATININE 1.26*   < > 2.60*   < > 2.63*   < > 2.69*   < > 2.58* 2.79* 1.2  CALCIUM 8.9   < > 8.3*   < > 7.9*   < > 8.2*   < > 8.4* 8.3* 9.3  MG 1.8  --  1.3*  --  1.9  --   --   --   --   --   --   PHOS  --    < > 2.7   < > 2.8   < > 2.9  --  2.5 2.5  --    < > = values in this interval not displayed.   Recent Labs    01/11/20 0802 01/11/20 0802 01/24/20 0449 01/24/20 0449 03/04/20 1628 03/08/20 0943 03/16/20 0529 03/17/20 0528 03/18/20 0658  AST 24  --  15  --  12*  --   --   --   --   ALT 59*  --  19  --  13  --   --   --   --   ALKPHOS 82  --  54  --  61  --   --   --   --   BILITOT 0.9  --  0.7  --  1.1  --   --   --   --   PROT 4.4*  --  4.1*  --  6.6  --   --   --   --   ALBUMIN 1.9*   < > 1.7*   < > 2.4*   < > 1.4* 1.5* 1.5*   < > = values in this interval not displayed.   Recent Labs    01/24/20 0449 01/24/20 0449 03/04/20 1628 03/04/20 1748 03/07/20 0352 03/10/20 0531 03/15/20 2446 03/15/20 2863 03/16/20 0529 03/17/20 0528 04/15/20 0000  WBC 2.8*   < > 16.4*   < > 10.6*   < > 8.4   < > 9.6 7.7 6.1  NEUTROABS 2.1  --  15.3*  --  9.4*  --   --   --   --   --   --   HGB 10.1*   < > 11.5*   < > 10.2*   < > 8.8*   < > 8.2* 8.0* 8.2*  HCT 31.2*   < > 36.0*   < > 32.8*   < > 27.9*   < > 24.7* 25.9* 26*  MCV 83.4   < > 80.9   < > 79.6*   < >  79.7*  --  76.2* 79.0*  --   PLT 81*   < > 174   < > 123*   < > 144*   < > 142* 144* 206   < > = values in this interval not displayed.   No results found  for: TSH Lab Results  Component Value Date   HGBA1C 10.9 (H) 03/05/2020   Lab Results  Component Value Date   CHOL 224 (H) 01/04/2020   HDL 43 01/04/2020   LDLCALC 164 (H) 01/04/2020   TRIG 87 01/04/2020   CHOLHDL 5.2 01/04/2020    Significant Diagnostic Results in last 30 days:  No results found.  Assessment/Plan 1. Anemia in chronic kidney disease, unspecified CKD stage Hgb low but stable compared to previous level 8.2 No active bleeding reported.Possible related ongoing chronic kidney disease.Appetite has improved.  - continue on Folic supplement. Will recheck CBC in 1 week.   2. Hypomagnesemia Mg level 1.5 will continue on magnesium oxide 400 mg daily.will recheck mg level in 1 week if still low will consider changing magnesium dose to twice daily.  Mg level in 1 week.   3. Hyponatremia Na+ 135  - BMP in 1 week    Family/ staff Communication: Reviewed plan of care with patient and facility Nurse supervisor.   Labs/tests ordered:  CBC,BMP,Mg level  in 1 week.

## 2020-04-21 ENCOUNTER — Ambulatory Visit: Payer: Medicare HMO | Admitting: Neurology

## 2020-04-22 ENCOUNTER — Other Ambulatory Visit: Payer: Self-pay | Admitting: Family

## 2020-04-22 MED ORDER — DRONABINOL 5 MG PO CAPS: 5.0000 mg | ORAL_CAPSULE | Freq: Two times a day (BID) | ORAL | 0 refills | Status: DC

## 2020-04-23 LAB — CBC AND DIFFERENTIAL
HCT: 23 — AB (ref 41–53)
Hemoglobin: 7.3 — AB (ref 13.5–17.5)
Platelets: 219 (ref 150–399)
WBC: 7.6

## 2020-04-23 LAB — BASIC METABOLIC PANEL
BUN: 25 — AB (ref 4–21)
CO2: 18 (ref 13–22)
Chloride: 101 (ref 99–108)
Creatinine: 1.4 — AB (ref 0.6–1.3)
Glucose: 92
Potassium: 4.4 (ref 3.4–5.3)
Sodium: 133 — AB (ref 137–147)

## 2020-04-23 LAB — COMPREHENSIVE METABOLIC PANEL
Calcium: 9.4 (ref 8.7–10.7)
GFR calc Af Amer: 59.73
GFR calc non Af Amer: 51.54

## 2020-04-23 LAB — CBC: RBC: 2.88 — AB (ref 3.87–5.11)

## 2020-04-24 ENCOUNTER — Telehealth: Payer: Self-pay | Admitting: Internal Medicine

## 2020-04-24 NOTE — Telephone Encounter (Signed)
Results received at [facility] from yesterday and hgb dropped by almost a point.  During hospital stay, anemia felt to be due to CKD with iron and b12 checked.  Had been transfused 2 units there.  Now trending down again. Spoke with [facility] in [facility]. Nephrologist had requested bmp recheck after visit 7/20 so results today faxed to his office for review with note about addressing anemia of CKD.   Resident has a d/c pending that his wife is appealing.  When he came in, there were some plans for possible snf long-term need.

## 2020-04-27 ENCOUNTER — Non-Acute Institutional Stay (SKILLED_NURSING_FACILITY): Payer: Medicare HMO | Admitting: Family

## 2020-04-27 ENCOUNTER — Encounter: Payer: Self-pay | Admitting: Family

## 2020-04-27 DIAGNOSIS — D631 Anemia in chronic kidney disease: Secondary | ICD-10-CM

## 2020-04-27 DIAGNOSIS — N189 Chronic kidney disease, unspecified: Secondary | ICD-10-CM | POA: Diagnosis not present

## 2020-04-27 NOTE — Progress Notes (Signed)
Location:    [facility]   [facility] Room Number: 782-N Place of Service:  SNF (31) Provider:  Marlowe Sax, NP    Patient Care Team: Drosinis, Pamalee Leyden, PA-C as PCP - General (Internal Medicine) Altheimer, Legrand Como, MD as Referring Physician (Endocrinology)  Extended Emergency Contact Information Primary Emergency Contact: Zufall,BEVERLY Address: [address] [place], [place] [address] Johnnette Litter of [place] Phone: [phone] Mobile Phone: [phone] Relation: Spouse Secondary Emergency Contact: [place] Mobile Phone: [phone] Relation: Son  Code Status:  FULL CODE Goals of care: Advanced Directive information Advanced Directives 04/27/2020  Does Patient Have a Medical Advance Directive? Yes  Does patient want to make changes to medical advance directive? No - Patient declined  Would patient like information on creating a medical advance directive? -     Chief Complaint  Patient presents with  . Acute Visit    Abnormal Labs.    HPI:  Pt is a 69 y.o. male seen today for an acute visit for evaluation of abnormal lab results.His recent labs showed mg level 1.5,CR 1.38 ( previous SCr 1.2,BUN 25 ) ,BUN 25 Also had labs seen by Dr.Reed 04/24/2020 Hgb 7.3 ( previous Hgb 8.2) labs faxed to [email or web address] already called Nephrologist office.Patient was seen by Nephrologist 04/14/2020 request labs to be faxed. He is currently on magnesium 400 mg tablet daily. He denies any acute issues.   Past Medical History:  Diagnosis Date  . Allergic rhinitis   . BPH (benign prostatic hyperplasia)   . Chronic atrial fibrillation ([place])   . Diabetes mellitus ([place])   . DVT (deep venous thrombosis) ([facility])    LLE  . Hematochezia   . HTN (hypertension)   . Hypomagnesemia 03/20/2019  . Kidney transplant recipient   . PVD (peripheral vascular disease) ([facility])    Past Surgical History:  Procedure Laterality Date  . KIDNEY TRANSPLANT      Allergies    Allergen Reactions  . Iodinated Diagnostic Agents Hives  . Tape Hives    Allergies as of 04/27/2020      Reactions   Iodinated Diagnostic Agents Hives   Tape Hives      Medication List       Accurate as of April 27, 2020 12:40 PM. If you have any questions, ask your nurse or doctor.        STOP taking these medications   calcium-vitamin D 500-200 MG-UNIT tablet Commonly known as: OSCAL WITH D Stopped by: Sandrea Hughs, NP   traZODone 50 MG tablet Commonly known as: DESYREL Stopped by: Sandrea Hughs, NP     TAKE these medications   acetaminophen 325 MG tablet Commonly known as: TYLENOL Take 650 mg by mouth every 6 (six) hours as needed. What changed: Another medication with the same name was removed. Continue taking this medication, and follow the directions you see here. Changed by: Sandrea Hughs, NP   apixaban 5 MG Tabs tablet Commonly known as: ELIQUIS Take 1 tablet (5 mg total) by mouth 2 (two) times daily.   artificial tears Oint ophthalmic ointment Commonly known as: LACRILUBE Place into the left eye at bedtime as needed for dry eyes.   atorvastatin 80 MG tablet Commonly known as: LIPITOR Take 1 tablet (80 mg total) by mouth daily at 6 PM.   brimonidine 0.2 % ophthalmic solution Commonly known as: ALPHAGAN Place 1 drop into the right eye 2 (two) times daily.  cholecalciferol 25 MCG (1000 UNIT) tablet Commonly known as: VITAMIN D Take 2,000 Units by mouth daily.   cinacalcet 60 MG tablet Commonly known as: SENSIPAR Take 60 mg by mouth every morning. With Breakfast.   dorzolamide-timolol 22.3-6.8 MG/ML ophthalmic solution Commonly known as: COSOPT Place 1 drop into the right eye 2 (two) times a day.   dronabinol 5 MG capsule Commonly known as: MARINOL Take 1 capsule (5 mg total) by mouth 2 (two) times daily before lunch and supper.   famotidine 10 MG tablet Commonly known as: PEPCID Take 10 mg by mouth daily.   feeding supplement (NEPRO  CARB STEADY) Liqd Take 237 mLs by mouth 2 (two) times daily between meals.   finasteride 5 MG tablet Commonly known as: PROSCAR Take 1 tablet (5 mg total) by mouth daily.   folic acid 1 MG tablet Commonly known as: FOLVITE TAKE 1 TABLET BY MOUTH EVERY DAY   hydrALAZINE 25 MG tablet Commonly known as: APRESOLINE Take 25 mg by mouth in the morning, at noon, in the evening, and at bedtime.   insulin glargine 100 UNIT/ML Solostar Pen Commonly known as: LANTUS Inject 35 Units into the skin daily.   Insulin Pen Needle 32G X 6 MM Misc 1 application by Does not apply route daily.   magnesium oxide 400 MG tablet Commonly known as: MAG-OX Take 1 tablet (400 mg total) by mouth daily.   metoprolol tartrate 25 MG tablet Commonly known as: LOPRESSOR Take 0.5 tablets (12.5 mg total) by mouth 2 (two) times daily.   mirtazapine 15 MG tablet Commonly known as: REMERON Take 1 tablet (15 mg total) by mouth at bedtime.   mycophenolate 180 MG EC tablet Commonly known as: MYFORTIC Take 360 mg by mouth 2 (two) times daily.   NIFEdipine 60 MG 24 hr tablet Commonly known as: ADALAT CC Take 60 mg by mouth daily.   predniSONE 2.5 MG tablet Commonly known as: DELTASONE Take 2.5 mg by mouth 3 (three) times daily.   sitaGLIPtin 100 MG tablet Commonly known as: JANUVIA Take 100 mg by mouth daily.   sodium bicarbonate 650 MG tablet Take 2 tablets (1,300 mg total) by mouth 2 (two) times daily.   SULFAMETHOXAZOLE-TMP DS PO Take 1 tablet by mouth. Monday, Wednesday, and Friday.   tacrolimus 0.5 MG capsule Commonly known as: PROGRAF Take 1.5 mg by mouth daily.   tacrolimus 1 MG capsule Commonly known as: PROGRAF Take 1 capsule (1 mg total) by mouth 2 (two) times daily.   tamsulosin 0.4 MG Caps capsule Commonly known as: FLOMAX Take 0.4 mg by mouth every morning.   THERA M PLUS PO Take 1 tablet by mouth daily.       Review of Systems  Constitutional: Negative for appetite change,  chills, fatigue and fever.  Respiratory: Negative for cough, chest tightness, shortness of breath and wheezing.   Cardiovascular: Negative for chest pain, palpitations and leg swelling.  Gastrointestinal: Negative for abdominal distention, abdominal pain, constipation, diarrhea, nausea and vomiting.  Musculoskeletal: Positive for gait problem. Negative for joint swelling and myalgias.  Skin: Negative for color change, pallor and rash.  Neurological: Negative for dizziness, speech difficulty, weakness, light-headedness and headaches.  Psychiatric/Behavioral: Negative for agitation and sleep disturbance. The patient is not nervous/anxious.     Immunization History  Administered Date(s) Administered  . Fluad Quad(high Dose 65+) 05/28/2019  . Influenza, High Dose Seasonal PF 06/30/2017, 07/13/2018, 07/13/2018, 05/04/2019, 05/28/2019  . Influenza-Unspecified 07/22/2016, 08/30/2016  . PFIZER SARS-COV-2 Vaccination 11/07/2019,  12/02/2019  . Pneumococcal Polysaccharide-23 06/20/2005, 04/09/2020   Pertinent  Health Maintenance Due  Topic Date Due  . FOOT EXAM  Never done  . OPHTHALMOLOGY EXAM  Never done  . COLONOSCOPY  Never done  . INFLUENZA VACCINE  04/26/2020  . HEMOGLOBIN A1C  09/04/2020  . PNA vac Low Risk Adult (2 of 2 - PCV13) 04/09/2021   Fall Risk  02/13/2020 07/26/2019 06/27/2019 05/10/2019  Falls in the past year? 0 1 0 0  Number falls in past yr: - 0 - -  Injury with Fall? - 1 - -   Functional Status Survey:    Vitals:   04/27/20 1215  BP: 124/70  Pulse: 80  Resp: 18  Temp: (!) 97.5 F (36.4 C)  SpO2: 99%  Weight: 175 lb (79.4 kg)  Height: 5\' 11"  (1.803 m)   Body mass index is 24.41 kg/m. Physical Exam Vitals reviewed.  Constitutional:      General: He is not in acute distress.    Appearance: He is not ill-appearing.  HENT:     Head: Normocephalic.     Nose: No congestion or rhinorrhea.     Mouth/Throat:     Mouth: Mucous membranes are moist.     Pharynx: No  oropharyngeal exudate or posterior oropharyngeal erythema.  Eyes:     General: No scleral icterus.       Right eye: No discharge.        Left eye: No discharge.     Conjunctiva/sclera: Conjunctivae normal.     Pupils: Pupils are equal, round, and reactive to light.  Cardiovascular:     Rate and Rhythm: Normal rate and regular rhythm.     Heart sounds: Murmur heard.      Comments: Left arm AV fistula positive for bruit and thrill. Pulmonary:     Effort: Pulmonary effort is normal. No respiratory distress.     Breath sounds: Normal breath sounds. No wheezing, rhonchi or rales.  Chest:     Chest wall: No tenderness.  Abdominal:     General: Bowel sounds are normal. There is no distension.     Palpations: Abdomen is soft. There is no mass.     Tenderness: There is no abdominal tenderness. There is no right CVA tenderness, left CVA tenderness, guarding or rebound.  Musculoskeletal:        General: No swelling or tenderness.     Right lower leg: No edema.     Left lower leg: No edema.     Comments: Moves x 4 extremities   Skin:    General: Skin is warm.     Coloration: Skin is not pale.     Findings: No bruising or erythema.  Neurological:     Mental Status: He is alert.     Gait: Gait abnormal.     Comments: Aphasia   Psychiatric:        Mood and Affect: Mood normal.        Behavior: Behavior normal.        Thought Content: Thought content normal.        Judgment: Judgment normal.     Labs reviewed: Recent Labs    01/08/20 0348 01/09/20 0348 03/13/20 0533 03/13/20 0533 03/14/20 0622 03/15/20 4403 03/16/20 0529 03/16/20 0529 03/17/20 0528 03/17/20 0528 03/18/20 0658 04/15/20 0000 04/23/20 0000  NA 131*   < > 132*   < > 131*   < > 132*   < > 133*   < >  133* 135* 133*  K 4.9   < > 3.8   < > 3.2*   < > 4.9   < > 4.1   < > 4.1 4.9 4.4  CL 102   < > 102   < > 102   < > 103   < > 103   < > 102 105 101  CO2 21*   < > 21*   < > 20*   < > 18*   < > 21*   < > 22 19 18     GLUCOSE 226*   < > 297*   < > 212*   < > 256*  --  274*  --  279*  --   --   BUN 52*   < > 61*   < > 59*   < > 63*   < > 65*   < > 67* 25* 25*  CREATININE 1.26*   < > 2.60*   < > 2.63*   < > 2.69*   < > 2.58*   < > 2.79* 1.2 1.4*  CALCIUM 8.9   < > 8.3*   < > 7.9*   < > 8.2*   < > 8.4*   < > 8.3* 9.3 9.4  MG 1.8  --  1.3*  --  1.9  --   --   --   --   --   --   --   --   PHOS  --    < > 2.7   < > 2.8   < > 2.9  --  2.5  --  2.5  --   --    < > = values in this interval not displayed.   Recent Labs    01/11/20 0802 01/11/20 0802 01/24/20 0449 01/24/20 0449 03/04/20 1628 03/08/20 0943 03/16/20 0529 03/17/20 0528 03/18/20 0658  AST 24  --  15  --  12*  --   --   --   --   ALT 59*  --  19  --  13  --   --   --   --   ALKPHOS 82  --  54  --  61  --   --   --   --   BILITOT 0.9  --  0.7  --  1.1  --   --   --   --   PROT 4.4*  --  4.1*  --  6.6  --   --   --   --   ALBUMIN 1.9*   < > 1.7*   < > 2.4*   < > 1.4* 1.5* 1.5*   < > = values in this interval not displayed.   Recent Labs    01/24/20 0449 01/24/20 0449 03/04/20 1628 03/04/20 1748 03/07/20 0352 03/10/20 0531 03/15/20 4315 03/15/20 4008 03/16/20 0529 03/16/20 0529 03/17/20 0528 04/15/20 0000 04/23/20 0000  WBC 2.8*   < > 16.4*   < > 10.6*   < > 8.4   < > 9.6   < > 7.7 6.1 7.6  NEUTROABS 2.1  --  15.3*  --  9.4*  --   --   --   --   --   --   --   --   HGB 10.1*   < > 11.5*   < > 10.2*   < > 8.8*   < > 8.2*   < > 8.0* 8.2* 7.3*  HCT 31.2*   < >  36.0*   < > 32.8*   < > 27.9*   < > 24.7*   < > 25.9* 26* 23*  MCV 83.4   < > 80.9   < > 79.6*   < > 79.7*  --  76.2*  --  79.0*  --   --   PLT 81*   < > 174   < > 123*   < > 144*   < > 142*   < > 144* 206 219   < > = values in this interval not displayed.   No results found for: TSH Lab Results  Component Value Date   HGBA1C 10.9 (H) 03/05/2020   Lab Results  Component Value Date   CHOL 224 (H) 01/04/2020   HDL 43 01/04/2020   LDLCALC 164 (H) 01/04/2020   TRIG 87  01/04/2020   CHOLHDL 5.2 01/04/2020    Significant Diagnostic Results in last 30 days:  No results found.  Assessment/Plan 1. Hypomagnesemia Mg level 1.5  - will increase magnesium oxide 400 mg tablet to one by mouth twice daily - Mg level in 1 week.   2. Anemia in chronic kidney disease, unspecified CKD stage  - Asymptomatic. - Hgb 7.3 ( 04/24/2020) evaluated already by Dr.Reed per chart report labs faxed by facility Nurse to patient's Nephrologist.  - CBC in 1 week   3. Chronic Kidney Disease stage unspecified  SCr 1.4 previous 1.2  - continue to avoid nephrotoxins and dose all medication for renal clearance. - continue to follow up with Nephrologist.  - BMP in 1 week   Family/ staff Communication: Reviewed plan with patient and facility Nurse.  Labs/tests ordered: Mg level,CBC,BMP in 1 week

## 2020-04-30 ENCOUNTER — Telehealth: Payer: Self-pay | Admitting: Family

## 2020-04-30 NOTE — Telephone Encounter (Signed)
Facility Nurse called on call provider states patient was due for Lantus 25 units SQ at bedtime but accidentally gave Novolog 25 units SQ instead.CBG was 348 patient has been given extra snacks.Orders given to check blood sugars every 1 hour x 4 hours.Nurse to [facility] provider for hypoglycemia.Dr.Reed notified agrees with monitoring CBG for now.

## 2020-05-04 ENCOUNTER — Non-Acute Institutional Stay (SKILLED_NURSING_FACILITY): Payer: Medicare HMO | Admitting: Adult Health

## 2020-05-04 ENCOUNTER — Encounter: Payer: Self-pay | Admitting: Adult Health

## 2020-05-04 DIAGNOSIS — K219 Gastro-esophageal reflux disease without esophagitis: Secondary | ICD-10-CM

## 2020-05-04 DIAGNOSIS — N189 Chronic kidney disease, unspecified: Secondary | ICD-10-CM | POA: Diagnosis not present

## 2020-05-04 DIAGNOSIS — Z8673 Personal history of transient ischemic attack (TIA), and cerebral infarction without residual deficits: Secondary | ICD-10-CM

## 2020-05-04 DIAGNOSIS — N2889 Other specified disorders of kidney and ureter: Secondary | ICD-10-CM

## 2020-05-04 DIAGNOSIS — I482 Chronic atrial fibrillation, unspecified: Secondary | ICD-10-CM

## 2020-05-04 DIAGNOSIS — N19 Unspecified kidney failure: Secondary | ICD-10-CM

## 2020-05-04 DIAGNOSIS — E559 Vitamin D deficiency, unspecified: Secondary | ICD-10-CM

## 2020-05-04 DIAGNOSIS — N400 Enlarged prostate without lower urinary tract symptoms: Secondary | ICD-10-CM

## 2020-05-04 DIAGNOSIS — R7989 Other specified abnormal findings of blood chemistry: Secondary | ICD-10-CM

## 2020-05-04 DIAGNOSIS — E1165 Type 2 diabetes mellitus with hyperglycemia: Secondary | ICD-10-CM

## 2020-05-04 DIAGNOSIS — N2581 Secondary hyperparathyroidism of renal origin: Secondary | ICD-10-CM

## 2020-05-04 DIAGNOSIS — R630 Anorexia: Secondary | ICD-10-CM

## 2020-05-04 DIAGNOSIS — E785 Hyperlipidemia, unspecified: Secondary | ICD-10-CM

## 2020-05-04 DIAGNOSIS — E1169 Type 2 diabetes mellitus with other specified complication: Secondary | ICD-10-CM

## 2020-05-04 DIAGNOSIS — Z940 Kidney transplant status: Secondary | ICD-10-CM

## 2020-05-04 DIAGNOSIS — I151 Hypertension secondary to other renal disorders: Secondary | ICD-10-CM

## 2020-05-04 DIAGNOSIS — D631 Anemia in chronic kidney disease: Secondary | ICD-10-CM

## 2020-05-04 LAB — BASIC METABOLIC PANEL
BUN: 29 — AB (ref 4–21)
CO2: 21 (ref 13–22)
Chloride: 105 (ref 99–108)
Creatinine: 1.2 (ref 0.6–1.3)
Glucose: 53
Potassium: 4.4 (ref 3.4–5.3)
Sodium: 137 (ref 137–147)

## 2020-05-04 LAB — HEPATIC FUNCTION PANEL
ALT: 37 (ref 10–40)
AST: 21 (ref 14–40)
Alkaline Phosphatase: 101 (ref 25–125)
Bilirubin, Total: 0.3

## 2020-05-04 LAB — COMPREHENSIVE METABOLIC PANEL
Albumin: 2.4 — AB (ref 3.5–5.0)
Calcium: 9.6 (ref 8.7–10.7)
GFR calc Af Amer: 74.45
GFR calc non Af Amer: 64.24
Globulin: 2.6

## 2020-05-04 LAB — CBC AND DIFFERENTIAL
HCT: 25 — AB (ref 41–53)
Hemoglobin: 7.6 — AB (ref 13.5–17.5)
Platelets: 247 (ref 150–399)
WBC: 7.1

## 2020-05-04 LAB — CBC: RBC: 3 — AB (ref 3.87–5.11)

## 2020-05-04 NOTE — Progress Notes (Deleted)
Location:  [facility] Room Number: 951-O Place of Service:  SNF (31) Provider:  Durenda Age, DNP, FNP-BC  Patient Care Team: Drosinis, Alwyn Ren as PCP - General (Internal Medicine) Altheimer, Legrand Como, MD as Referring Physician (Endocrinology)  Extended Emergency Contact Information Primary Emergency Contact: Marchetta,BEVERLY Address: [address] [place], [place] [address] Johnnette Litter of [place] Phone: [phone] Mobile Phone: [phone] Relation: Spouse Secondary Emergency Contact: [place] Mobile Phone: [phone] Relation: Son  Code Status:  FULL CODE  Goals of care: Advanced Directive information Advanced Directives 04/27/2020  Does Patient Have a Medical Advance Directive? Yes  Does patient want to make changes to medical advance directive? No - Patient declined  Would patient like information on creating a medical advance directive? -     Chief Complaint  Patient presents with  . Discharge Note    Patient is seen for discharge home on 05/05/20    HPI:  Pt is a 69 y.o. male seen today for discharge. He has a PMH of BPH, DVT, DM, HTN, PVD, and chronic atrial fibrillation. ***   Past Medical History:  Diagnosis Date  . Allergic rhinitis   . BPH (benign prostatic hyperplasia)   . Chronic atrial fibrillation ([place])   . Diabetes mellitus ([place])   . DVT (deep venous thrombosis) ([facility])    LLE  . Hematochezia   . HTN (hypertension)   . Hypomagnesemia 03/20/2019  . Kidney transplant recipient   . PVD (peripheral vascular disease) ([place])    Past Surgical History:  Procedure Laterality Date  . KIDNEY TRANSPLANT      Allergies  Allergen Reactions  . Iodinated Diagnostic Agents Hives  . Tape Hives    Outpatient Encounter Medications as of 05/04/2020  Medication Sig  . acetaminophen (TYLENOL) 325 MG tablet Take 650 mg by mouth every 6 (six) hours as needed.  Marland Kitchen apixaban (ELIQUIS) 5 MG TABS tablet Take  1 tablet (5 mg total) by mouth 2 (two) times daily.  Marland Kitchen artificial tears (LACRILUBE) OINT ophthalmic ointment Place into the left eye at bedtime as needed for dry eyes.  Marland Kitchen atorvastatin (LIPITOR) 80 MG tablet Take 1 tablet (80 mg total) by mouth daily at 6 PM.  . brimonidine (ALPHAGAN) 0.2 % ophthalmic solution Place 1 drop into the right eye 2 (two) times daily.  . cholecalciferol (VITAMIN D) 25 MCG (1000 UNIT) tablet Take 2,000 Units by mouth daily.  . cinacalcet (SENSIPAR) 60 MG tablet Take 60 mg by mouth every morning. With Breakfast.  . dorzolamide-timolol (COSOPT) 22.3-6.8 MG/ML ophthalmic solution Place 1 drop into the right eye 2 (two) times a day.  . dronabinol (MARINOL) 5 MG capsule Take 1 capsule (5 mg total) by mouth 2 (two) times daily before lunch and supper.  . famotidine (PEPCID) 10 MG tablet Take 10 mg by mouth daily.  . finasteride (PROSCAR) 5 MG tablet Take 1 tablet (5 mg total) by mouth daily.  . folic acid (FOLVITE) 1 MG tablet TAKE 1 TABLET BY MOUTH EVERY DAY  . Glucerna (GLUCERNA) LIQD Take 237 mLs by mouth daily.  . hydrALAZINE (APRESOLINE) 25 MG tablet Take 25 mg by mouth in the morning, at noon, in the evening, and at bedtime.  . insulin glargine (LANTUS SOLOSTAR) 100 UNIT/ML Solostar Pen Inject 25 Units into the skin at bedtime.  . Insulin Pen Needle 32G X 6 MM MISC 1 application by Does not apply route  daily.  . magnesium oxide (MAG-OX) 400 MG tablet Take 400 mg by mouth 2 (two) times daily.  . metoprolol tartrate (LOPRESSOR) 25 MG tablet Take 0.5 tablets (12.5 mg total) by mouth 2 (two) times daily.  . mirtazapine (REMERON) 15 MG tablet Take 1 tablet (15 mg total) by mouth at bedtime.  . Multiple Vitamins-Minerals (THERA M PLUS PO) Take 1 tablet by mouth daily.  . mycophenolate (MYFORTIC) 180 MG EC tablet Take 360 mg by mouth 2 (two) times daily.   Marland Kitchen NIFEdipine (ADALAT CC) 60 MG 24 hr tablet Take 60 mg by mouth daily.  . predniSONE (DELTASONE) 2.5 MG tablet Take 7.5 mg  by mouth daily. Take 2 tablets to = 7.5 mg  . sitaGLIPtin (JANUVIA) 25 MG tablet Take 25 mg by mouth daily.  . sodium bicarbonate 650 MG tablet Take 2 tablets (1,300 mg total) by mouth 2 (two) times daily.  . Sulfamethoxazole-Trimethoprim (SULFAMETHOXAZOLE-TMP DS PO) Take 1 tablet by mouth. Monday, Wednesday, and Friday.  . tacrolimus (PROGRAF) 0.5 MG capsule Take 1.5 mg by mouth 2 (two) times daily.   . tamsulosin (FLOMAX) 0.4 MG CAPS capsule Take 0.4 mg by mouth every morning.  . [DISCONTINUED] insulin glargine (LANTUS) 100 UNIT/ML Solostar Pen Inject 35 Units into the skin daily. (Patient taking differently: Inject 25 Units into the skin daily. )  . [DISCONTINUED] magnesium oxide (MAG-OX) 400 MG tablet Take 1 tablet (400 mg total) by mouth daily.  . [DISCONTINUED] Nutritional Supplements (FEEDING SUPPLEMENT, NEPRO CARB STEADY,) LIQD Take 237 mLs by mouth 2 (two) times daily between meals.  . [DISCONTINUED] sitaGLIPtin (JANUVIA) 100 MG tablet Take 100 mg by mouth daily.  . [DISCONTINUED] tacrolimus (PROGRAF) 1 MG capsule Take 1 capsule (1 mg total) by mouth 2 (two) times daily.   No facility-administered encounter medications on file as of 05/04/2020.    Review of Systems  GENERAL: No change in appetite, no fatigue, no weight changes, no fever, chills or weakness SKIN: Denies rash, itching, wounds, ulcer sores, or nail abnormalities EYES: Denies change in vision, dry eyes, eye pain, itching or discharge EARS: Denies change in hearing, ringing in ears, or earache NOSE: Denies nasal congestion or epistaxis MOUTH and THROAT: Denies oral discomfort, gingival pain or bleeding, pain from teeth or hoarseness   RESPIRATORY: no cough, SOB, DOE, wheezing, hemoptysis CARDIAC: No chest pain, edema or palpitations GI: No abdominal pain, diarrhea, constipation, heart burn, nausea or vomiting GU: Denies dysuria, frequency, hematuria, incontinence, or discharge MUSCULOSKELETAL: Denies joint pain, muscle  pain, back pain, restricted movement, or unusual weakness CIRCULATION: Denies claudication, edema of legs, varicosities, or cold extremities NEUROLOGICAL: Denies dizziness, syncope, numbness, or headache PSYCHIATRIC: Denies feelings of depression or anxiety. No report of hallucinations, insomnia, paranoia, or agitation ENDOCRINE: Denies polyphagia, polyuria, polydipsia, heat or cold intolerance HEME/LYMPH: Denies excessive bruising, petechia, enlarged lymph nodes, or bleeding problems IMMUNOLOGIC: Denies history of frequent infections, AIDS, or use of immunosuppressive agents   Immunization History  Administered Date(s) Administered  . Fluad Quad(high Dose 65+) 05/28/2019  . Influenza, High Dose Seasonal PF 06/30/2017, 07/13/2018, 07/13/2018, 05/04/2019, 05/28/2019  . Influenza-Unspecified 07/22/2016, 08/30/2016  . PFIZER SARS-COV-2 Vaccination 11/07/2019, 12/02/2019  . Pneumococcal Polysaccharide-23 06/20/2005, 04/09/2020   Pertinent  Health Maintenance Due  Topic Date Due  . FOOT EXAM  Never done  . OPHTHALMOLOGY EXAM  Never done  . COLONOSCOPY  Never done  . INFLUENZA VACCINE  04/26/2020  . HEMOGLOBIN A1C  09/04/2020  . PNA vac Low Risk Adult (2  of 2 - PCV13) 04/09/2021   Fall Risk  02/13/2020 07/26/2019 06/27/2019 05/10/2019  Falls in the past year? 0 1 0 0  Number falls in past yr: - 0 - -  Injury with Fall? - 1 - -     Vitals:   05/04/20 1637  BP: 118/75  Pulse: 68  Resp: 17  Temp: 97.6 F (36.4 C)  TempSrc: Oral  Weight: 160 lb 12.8 oz (72.9 kg)  Height: 5\' 11"  (1.803 m)   Body mass index is 22.43 kg/m.  Physical Exam  GENERAL APPEARANCE: Well nourished. In no acute distress. Normal body habitus SKIN:  Skin is warm and dry. There are no suspicious lesions or rash HEAD: Normal in size and contour. No evidence of trauma EYES: Lids open and close normally. No blepharitis, entropion or ectropion. PERRL. Conjunctivae are clear and sclerae are white. Lenses are  without opacity EARS: Pinnae are normal. Patient hears normal voice tunes of the examiner MOUTH and THROAT: Lips are without lesions. Oral mucosa is moist and without lesions. Tongue is normal in shape, size, and color and without lesions NECK: supple, trachea midline, no neck masses, no thyroid tenderness, no thyromegaly LYMPHATICS: No LAN in the neck, no supraclavicular LAN RESPIRATORY: Breathing is even & unlabored, BS CTAB CARDIAC: RRR, no murmur,no extra heart sounds, no edema GI: Abdomen soft, normal BS, no masses, no tenderness, no hepatomegaly, no splenomegaly MUSCULOSKELETAL: No deformities. Movement at each extremity is full and painless. Strength is 5/5 at each extremity. Back is without kyphosis or scoliosis CIRCULATION: Pedal pulses are 2+. There is no edema of the legs, ankles and feet NEUROLOGICAL: There is no tremor. Speech is clear PSYCHIATRIC: Alert and oriented X 3. Affect and behavior are appropriate  Labs reviewed: Recent Labs    01/08/20 0348 01/09/20 0348 03/13/20 0533 03/13/20 0533 03/14/20 0622 03/15/20 4782 03/16/20 0529 03/16/20 0529 03/17/20 0528 03/17/20 0528 03/18/20 0658 04/15/20 0000 04/23/20 0000  NA 131*   < > 132*   < > 131*   < > 132*   < > 133*   < > 133* 135* 133*  K 4.9   < > 3.8   < > 3.2*   < > 4.9   < > 4.1   < > 4.1 4.9 4.4  CL 102   < > 102   < > 102   < > 103   < > 103   < > 102 105 101  CO2 21*   < > 21*   < > 20*   < > 18*   < > 21*   < > 22 19 18   GLUCOSE 226*   < > 297*   < > 212*   < > 256*  --  274*  --  279*  --   --   BUN 52*   < > 61*   < > 59*   < > 63*   < > 65*   < > 67* 25* 25*  CREATININE 1.26*   < > 2.60*   < > 2.63*   < > 2.69*   < > 2.58*   < > 2.79* 1.2 1.4*  CALCIUM 8.9   < > 8.3*   < > 7.9*   < > 8.2*   < > 8.4*   < > 8.3* 9.3 9.4  MG 1.8  --  1.3*  --  1.9  --   --   --   --   --   --   --   --  PHOS  --    < > 2.7   < > 2.8   < > 2.9  --  2.5  --  2.5  --   --    < > = values in this interval not displayed.    Recent Labs    01/11/20 0802 01/11/20 0802 01/24/20 0449 01/24/20 0449 03/04/20 1628 03/08/20 0943 03/16/20 0529 03/17/20 0528 03/18/20 0658  AST 24  --  15  --  12*  --   --   --   --   ALT 59*  --  19  --  13  --   --   --   --   ALKPHOS 82  --  54  --  61  --   --   --   --   BILITOT 0.9  --  0.7  --  1.1  --   --   --   --   PROT 4.4*  --  4.1*  --  6.6  --   --   --   --   ALBUMIN 1.9*   < > 1.7*   < > 2.4*   < > 1.4* 1.5* 1.5*   < > = values in this interval not displayed.   Recent Labs    01/24/20 0449 01/24/20 0449 03/04/20 1628 03/04/20 1748 03/07/20 0352 03/10/20 0531 03/15/20 6503 03/15/20 5465 03/16/20 0529 03/16/20 0529 03/17/20 0528 04/15/20 0000 04/23/20 0000  WBC 2.8*   < > 16.4*   < > 10.6*   < > 8.4   < > 9.6   < > 7.7 6.1 7.6  NEUTROABS 2.1  --  15.3*  --  9.4*  --   --   --   --   --   --   --   --   HGB 10.1*   < > 11.5*   < > 10.2*   < > 8.8*   < > 8.2*   < > 8.0* 8.2* 7.3*  HCT 31.2*   < > 36.0*   < > 32.8*   < > 27.9*   < > 24.7*   < > 25.9* 26* 23*  MCV 83.4   < > 80.9   < > 79.6*   < > 79.7*  --  76.2*  --  79.0*  --   --   PLT 81*   < > 174   < > 123*   < > 144*   < > 142*   < > 144* 206 219   < > = values in this interval not displayed.    Lab Results  Component Value Date   HGBA1C 10.9 (H) 03/05/2020   Lab Results  Component Value Date   CHOL 224 (H) 01/04/2020   HDL 43 01/04/2020   LDLCALC 164 (H) 01/04/2020   TRIG 87 01/04/2020   CHOLHDL 5.2 01/04/2020    Assessment/Plan ***   Family/ staff Communication: ***  Labs/tests ordered:  ***  Goals of care:   Discharge    Durenda Age, DNP, MSN, FNP-BC [facility] and [facility] [phone] (Monday-Friday 8:00 a.m. - 5:00 p.m.) [phone] (after hours)

## 2020-05-05 ENCOUNTER — Other Ambulatory Visit: Payer: Self-pay | Admitting: Adult Health

## 2020-05-05 DIAGNOSIS — R630 Anorexia: Secondary | ICD-10-CM

## 2020-05-05 DIAGNOSIS — N400 Enlarged prostate without lower urinary tract symptoms: Secondary | ICD-10-CM | POA: Insufficient documentation

## 2020-05-05 DIAGNOSIS — I151 Hypertension secondary to other renal disorders: Secondary | ICD-10-CM

## 2020-05-05 MED ORDER — FAMOTIDINE 10 MG PO TABS: 10.0000 mg | ORAL_TABLET | Freq: Every day | ORAL | 0 refills | Status: AC

## 2020-05-05 MED ORDER — FOLIC ACID 1 MG PO TABS: 1.0000 mg | ORAL_TABLET | Freq: Every day | ORAL | 0 refills | Status: AC

## 2020-05-05 MED ORDER — ARTIFICIAL TEARS OPHTHALMIC OINT: TOPICAL_OINTMENT | Freq: Every evening | OPHTHALMIC | 0 refills | Status: AC | PRN

## 2020-05-05 MED ORDER — ARTIFICIAL TEARS OPHTHALMIC OINT: TOPICAL_OINTMENT | Freq: Every evening | OPHTHALMIC | 0 refills | Status: DC | PRN

## 2020-05-05 MED ORDER — METOPROLOL TARTRATE 25 MG PO TABS: 12.5000 mg | ORAL_TABLET | Freq: Two times a day (BID) | ORAL | 0 refills | Status: AC

## 2020-05-05 MED ORDER — BRIMONIDINE TARTRATE 0.2 % OP SOLN: 1.0000 [drp] | Freq: Two times a day (BID) | OPHTHALMIC | 0 refills | Status: AC

## 2020-05-05 MED ORDER — MIRTAZAPINE 15 MG PO TABS: 15.0000 mg | ORAL_TABLET | Freq: Every day | ORAL | 0 refills | Status: DC

## 2020-05-05 MED ORDER — HYDRALAZINE HCL 25 MG PO TABS: 25.0000 mg | ORAL_TABLET | Freq: Four times a day (QID) | ORAL | 0 refills | Status: DC

## 2020-05-05 MED ORDER — INSULIN PEN NEEDLE 32G X 6 MM MISC: 1.0000 "application " | Freq: Every day | 0 refills | Status: AC

## 2020-05-05 MED ORDER — DRONABINOL 5 MG PO CAPS: 5.0000 mg | ORAL_CAPSULE | Freq: Two times a day (BID) | ORAL | 0 refills | Status: AC

## 2020-05-05 MED ORDER — APIXABAN 5 MG PO TABS: 5.0000 mg | ORAL_TABLET | Freq: Two times a day (BID) | ORAL | 0 refills | Status: AC

## 2020-05-05 MED ORDER — CINACALCET HCL 60 MG PO TABS: 60.0000 mg | ORAL_TABLET | ORAL | 0 refills | Status: AC

## 2020-05-05 MED ORDER — DORZOLAMIDE HCL-TIMOLOL MAL 2-0.5 % OP SOLN: 1.0000 [drp] | Freq: Two times a day (BID) | OPHTHALMIC | 0 refills | Status: AC

## 2020-05-05 MED ORDER — SULFAMETHOXAZOLE-TRIMETHOPRIM 800-160 MG PO TABS
1.0000 | ORAL_TABLET | ORAL | 0 refills | Status: DC
Start: 2020-05-06 — End: 2020-08-15

## 2020-05-05 MED ORDER — NIFEDIPINE ER 60 MG PO TB24: 60.0000 mg | ORAL_TABLET | Freq: Every day | ORAL | 0 refills | Status: AC

## 2020-05-05 MED ORDER — PREDNISONE 2.5 MG PO TABS: 7.5000 mg | ORAL_TABLET | Freq: Every day | ORAL | 0 refills | Status: AC

## 2020-05-05 MED ORDER — VITAMIN D3 25 MCG (1000 UNIT) PO TABS: 2000.0000 [IU] | ORAL_TABLET | Freq: Every day | ORAL | 0 refills | Status: AC

## 2020-05-05 MED ORDER — TACROLIMUS 0.5 MG PO CAPS: 1.5000 mg | ORAL_CAPSULE | Freq: Two times a day (BID) | ORAL | 0 refills | Status: AC

## 2020-05-05 MED ORDER — MYCOPHENOLATE SODIUM 180 MG PO TBEC: 360.0000 mg | DELAYED_RELEASE_TABLET | Freq: Two times a day (BID) | ORAL | 0 refills | Status: AC

## 2020-05-05 MED ORDER — LANTUS SOLOSTAR 100 UNIT/ML ~~LOC~~ SOPN: 25.0000 [IU] | PEN_INJECTOR | Freq: Every day | SUBCUTANEOUS | 0 refills | Status: DC

## 2020-05-05 MED ORDER — FINASTERIDE 5 MG PO TABS: 5.0000 mg | ORAL_TABLET | Freq: Every day | ORAL | 0 refills | Status: AC

## 2020-05-05 MED ORDER — TAMSULOSIN HCL 0.4 MG PO CAPS: 0.4000 mg | ORAL_CAPSULE | ORAL | 0 refills | Status: AC

## 2020-05-05 MED ORDER — SODIUM BICARBONATE 650 MG PO TABS: 1300.0000 mg | ORAL_TABLET | Freq: Two times a day (BID) | ORAL | 0 refills | Status: AC

## 2020-05-05 MED ORDER — MAGNESIUM OXIDE 400 MG PO TABS: 400.0000 mg | ORAL_TABLET | Freq: Two times a day (BID) | ORAL | 0 refills | Status: AC

## 2020-05-05 MED ORDER — ATORVASTATIN CALCIUM 80 MG PO TABS: 80.0000 mg | ORAL_TABLET | Freq: Every day | ORAL | 0 refills | Status: AC

## 2020-05-05 MED ORDER — SITAGLIPTIN PHOSPHATE 25 MG PO TABS: 25.0000 mg | ORAL_TABLET | Freq: Every day | ORAL | 0 refills | Status: DC

## 2020-05-05 NOTE — Progress Notes (Signed)
Location:  [facility] Room Number: 627-O Place of Service:  SNF (31) Provider:  Durenda Age, DNP, FNP-BC  Patient Care Team: Drosinis, Alwyn Ren as PCP - General (Internal Medicine) Altheimer, Legrand Como, MD as Referring Physician (Endocrinology)  Extended Emergency Contact Information Primary Emergency Contact: Lacasse,BEVERLY Address: [address] [place], [place] [address] Johnnette Litter of [place] Phone: [phone] Mobile Phone: [phone] Relation: Spouse Secondary Emergency Contact: [place] Mobile Phone: [phone] Relation: Son  Code Status:  FULL CODE  Goals of care: Advanced Directive information Advanced Directives 04/27/2020  Does Patient Have a Medical Advance Directive? Yes  Does patient want to make changes to medical advance directive? No - Patient declined  Would patient like information on creating a medical advance directive? -     Chief Complaint  Patient presents with   Discharge Note    Patient is seen for discharge home on 05/05/20    HPI:  Pt is a 69 y.o. male who is for discharge home with home health PT, OT, nursing, aide and social worker.  He was admitted to [facility] on 04/09/20 post hospitalization at [facility] 03/08/2020 to 04/09/2020.  He has a PMH of hypertension, hyperlipidemia, atrial fibrillation on Eliquis, type 2 diabetes mellitus, history of stroke with expressive aphasia and end-stage renal disease S/P renal transplant in 2014.  In the ED at [facility], his hemoglobin was 8.7, down from 11.22 months before.  Creatinine was 3.47.  He had no signs of volume overload, potassium and bicarb were normal.  It was unclear if he was having prerenal azotemia versus rejection.  He was put on strict I's and O's, daily weights and creatinine trended down.  Medications were renally dosed.  His immunosuppressants were continued and levels  checked.  Patient was admitted to this facility for short-term rehabilitation after the patient's recent hospitalization.  Patient has completed SNF rehabilitation and therapy has cleared the patient for discharge.   Past Medical History:  Diagnosis Date   Allergic rhinitis    BPH (benign prostatic hyperplasia)    Chronic atrial fibrillation (HCC)    Diabetes mellitus ([place])    DVT (deep venous thrombosis) (HCC)    LLE   Hematochezia    HTN (hypertension)    Hypomagnesemia 03/20/2019   Kidney transplant recipient    PVD (peripheral vascular disease) ([place])    Past Surgical History:  Procedure Laterality Date   KIDNEY TRANSPLANT      Allergies  Allergen Reactions   Iodinated Diagnostic Agents Hives   Tape Hives    Outpatient Encounter Medications as of 05/04/2020  Medication Sig   acetaminophen (TYLENOL) 325 MG tablet Take 650 mg by mouth every 6 (six) hours as needed.   apixaban (ELIQUIS) 5 MG TABS tablet Take 1 tablet (5 mg total) by mouth 2 (two) times daily.   artificial tears (LACRILUBE) OINT ophthalmic ointment Place into the left eye at bedtime as needed for dry eyes.   atorvastatin (LIPITOR) 80 MG tablet Take 1 tablet (80 mg total) by mouth daily at 6 PM.   brimonidine (ALPHAGAN) 0.2 % ophthalmic solution Place 1 drop into the right eye 2 (two) times daily.   cholecalciferol (VITAMIN D) 25 MCG (1000 UNIT) tablet Take 2 tablets (2,000 Units total) by mouth daily.   cinacalcet (SENSIPAR) 60 MG tablet Take 1 tablet (60 mg total) by mouth every morning. With Breakfast.  dorzolamide-timolol (COSOPT) 22.3-6.8 MG/ML ophthalmic solution Place 1 drop into the right eye 2 (two) times daily.   dronabinol (MARINOL) 5 MG capsule Take 1 capsule (5 mg total) by mouth 2 (two) times daily before lunch and supper.   famotidine (PEPCID) 10 MG tablet Take 1 tablet (10 mg total) by mouth daily.   finasteride (PROSCAR) 5 MG tablet Take 1 tablet (5 mg total) by mouth  daily.   folic acid (FOLVITE) 1 MG tablet Take 1 tablet (1 mg total) by mouth daily.   Glucerna (GLUCERNA) LIQD Take 237 mLs by mouth daily.   hydrALAZINE (APRESOLINE) 25 MG tablet Take 1 tablet (25 mg total) by mouth in the morning, at noon, in the evening, and at bedtime.   insulin glargine (LANTUS SOLOSTAR) 100 UNIT/ML Solostar Pen Inject 25 Units into the skin at bedtime.   Insulin Pen Needle 32G X 6 MM MISC 1 application by Does not apply route daily.   magnesium oxide (MAG-OX) 400 MG tablet Take 1 tablet (400 mg total) by mouth 2 (two) times daily.   metoprolol tartrate (LOPRESSOR) 25 MG tablet Take 0.5 tablets (12.5 mg total) by mouth 2 (two) times daily.   mirtazapine (REMERON) 15 MG tablet Take 1 tablet (15 mg total) by mouth at bedtime.   Multiple Vitamins-Minerals (THERA M PLUS PO) Take 1 tablet by mouth daily.   mycophenolate (MYFORTIC) 180 MG EC tablet Take 2 tablets (360 mg total) by mouth 2 (two) times daily.   NIFEdipine (ADALAT CC) 60 MG 24 hr tablet Take 1 tablet (60 mg total) by mouth daily.   predniSONE (DELTASONE) 2.5 MG tablet Take 3 tablets (7.5 mg total) by mouth daily. Take 3 tablets to = 7.5 mg   sitaGLIPtin (JANUVIA) 25 MG tablet Take 1 tablet (25 mg total) by mouth daily.   sodium bicarbonate 650 MG tablet Take 2 tablets (1,300 mg total) by mouth 2 (two) times daily.   tacrolimus (PROGRAF) 0.5 MG capsule Take 3 capsules (1.5 mg total) by mouth 2 (two) times daily.   tamsulosin (FLOMAX) 0.4 MG CAPS capsule Take 1 capsule (0.4 mg total) by mouth every morning.   [START ON 05/06/2020] sulfamethoxazole-trimethoprim (BACTRIM DS) 800-160 MG tablet Take 1 tablet by mouth 3 (three) times a week. Monday, Wednesday, and Friday.   [DISCONTINUED] apixaban (ELIQUIS) 5 MG TABS tablet Take 1 tablet (5 mg total) by mouth 2 (two) times daily.   [DISCONTINUED] artificial tears (LACRILUBE) OINT ophthalmic ointment Place into the left eye at bedtime as needed for dry  eyes.   [DISCONTINUED] atorvastatin (LIPITOR) 80 MG tablet Take 1 tablet (80 mg total) by mouth daily at 6 PM.   [DISCONTINUED] brimonidine (ALPHAGAN) 0.2 % ophthalmic solution Place 1 drop into the right eye 2 (two) times daily.   [DISCONTINUED] cholecalciferol (VITAMIN D) 25 MCG (1000 UNIT) tablet Take 2,000 Units by mouth daily.   [DISCONTINUED] cinacalcet (SENSIPAR) 60 MG tablet Take 60 mg by mouth every morning. With Breakfast.   [DISCONTINUED] dorzolamide-timolol (COSOPT) 22.3-6.8 MG/ML ophthalmic solution Place 1 drop into the right eye 2 (two) times a day.   [DISCONTINUED] dronabinol (MARINOL) 5 MG capsule Take 1 capsule (5 mg total) by mouth 2 (two) times daily before lunch and supper.   [DISCONTINUED] famotidine (PEPCID) 10 MG tablet Take 10 mg by mouth daily.   [DISCONTINUED] finasteride (PROSCAR) 5 MG tablet Take 1 tablet (5 mg total) by mouth daily.   [DISCONTINUED] folic acid (FOLVITE) 1 MG tablet TAKE 1 TABLET BY MOUTH  EVERY DAY   [DISCONTINUED] hydrALAZINE (APRESOLINE) 25 MG tablet Take 25 mg by mouth in the morning, at noon, in the evening, and at bedtime.   [DISCONTINUED] insulin glargine (LANTUS SOLOSTAR) 100 UNIT/ML Solostar Pen Inject 25 Units into the skin at bedtime.   [DISCONTINUED] insulin glargine (LANTUS) 100 UNIT/ML Solostar Pen Inject 35 Units into the skin daily. (Patient taking differently: Inject 25 Units into the skin daily. )   [DISCONTINUED] Insulin Pen Needle 32G X 6 MM MISC 1 application by Does not apply route daily.   [DISCONTINUED] magnesium oxide (MAG-OX) 400 MG tablet Take 1 tablet (400 mg total) by mouth daily.   [DISCONTINUED] magnesium oxide (MAG-OX) 400 MG tablet Take 400 mg by mouth 2 (two) times daily.   [DISCONTINUED] metoprolol tartrate (LOPRESSOR) 25 MG tablet Take 0.5 tablets (12.5 mg total) by mouth 2 (two) times daily.   [DISCONTINUED] mirtazapine (REMERON) 15 MG tablet Take 1 tablet (15 mg total) by mouth at bedtime.    [DISCONTINUED] mycophenolate (MYFORTIC) 180 MG EC tablet Take 360 mg by mouth 2 (two) times daily.    [DISCONTINUED] NIFEdipine (ADALAT CC) 60 MG 24 hr tablet Take 60 mg by mouth daily.   [DISCONTINUED] Nutritional Supplements (FEEDING SUPPLEMENT, NEPRO CARB STEADY,) LIQD Take 237 mLs by mouth 2 (two) times daily between meals.   [DISCONTINUED] predniSONE (DELTASONE) 2.5 MG tablet Take 7.5 mg by mouth daily. Take 2 tablets to = 7.5 mg   [DISCONTINUED] sitaGLIPtin (JANUVIA) 100 MG tablet Take 100 mg by mouth daily.   [DISCONTINUED] sitaGLIPtin (JANUVIA) 25 MG tablet Take 25 mg by mouth daily.   [DISCONTINUED] sodium bicarbonate 650 MG tablet Take 2 tablets (1,300 mg total) by mouth 2 (two) times daily.   [DISCONTINUED] Sulfamethoxazole-Trimethoprim (SULFAMETHOXAZOLE-TMP DS PO) Take 1 tablet by mouth. Monday, Wednesday, and Friday.   [DISCONTINUED] tacrolimus (PROGRAF) 0.5 MG capsule Take 1.5 mg by mouth 2 (two) times daily.    [DISCONTINUED] tacrolimus (PROGRAF) 1 MG capsule Take 1 capsule (1 mg total) by mouth 2 (two) times daily.   [DISCONTINUED] tamsulosin (FLOMAX) 0.4 MG CAPS capsule Take 0.4 mg by mouth every morning.   No facility-administered encounter medications on file as of 05/04/2020.    Review of Systems  GENERAL: No change in appetite, no fatigue, no weight changes, no fever, chills or weakness MOUTH and THROAT: Denies oral discomfort, gingival pain or bleeding RESPIRATORY: no cough, SOB, DOE, wheezing, hemoptysis CARDIAC: No chest pain, edema or palpitations GI: No abdominal pain, diarrhea, constipation, heart burn, nausea or vomiting NEUROLOGICAL: Denies dizziness, syncope, numbness, or headache PSYCHIATRIC: Denies feelings of depression or anxiety. No report of hallucinations, insomnia, paranoia, or agitation   Immunization History  Administered Date(s) Administered   Fluad Quad(high Dose 65+) 05/28/2019   Influenza, High Dose Seasonal PF 06/30/2017, 07/13/2018,  07/13/2018, 05/04/2019, 05/28/2019   Influenza-Unspecified 07/22/2016, 08/30/2016   PFIZER SARS-COV-2 Vaccination 11/07/2019, 12/02/2019   Pneumococcal Polysaccharide-23 06/20/2005, 04/09/2020   Pertinent  Health Maintenance Due  Topic Date Due   FOOT EXAM  Never done   OPHTHALMOLOGY EXAM  Never done   COLONOSCOPY  Never done   INFLUENZA VACCINE  04/26/2020   HEMOGLOBIN A1C  09/04/2020   PNA vac Low Risk Adult (2 of 2 - PCV13) 04/09/2021   Fall Risk  02/13/2020 07/26/2019 06/27/2019 05/10/2019  Falls in the past year? 0 1 0 0  Number falls in past yr: - 0 - -  Injury with Fall? - 1 - -     Vitals:  05/04/20 1637  BP: 118/75  Pulse: 68  Resp: 17  Temp: 97.6 F (36.4 C)  TempSrc: Oral  Weight: 160 lb 12.8 oz (72.9 kg)  Height: 5\' 11"  (1.803 m)   Body mass index is 22.43 kg/m.  Physical Exam  GENERAL APPEARANCE: Well nourished. In no acute distress. Normal body habitus SKIN:  Left heel deep tissue injury with dressing. MOUTH and THROAT: Lips are without lesions. Oral mucosa is moist and without lesions. Tongue is normal in shape, size, and color and without lesions RESPIRATORY: Breathing is even & unlabored, BS CTAB CARDIAC: RRR, + murmur,no extra heart sounds, no edema. Left upper arm with AV fistula GI: Abdomen soft, normal BS, no masses, no tenderness NEUROLOGICAL: There is no tremor. Speech is clear. Alert to self, disoriented to time and place, generalized weakness PSYCHIATRIC:  Affect and behavior are appropriate  Labs reviewed: Recent Labs    01/08/20 0348 01/09/20 0348 03/13/20 0533 03/13/20 0533 03/14/20 0622 03/15/20 0814 03/16/20 0529 03/16/20 0529 03/17/20 0528 03/17/20 0528 03/18/20 4818 03/18/20 0658 04/15/20 0000 04/23/20 0000 05/04/20 0000  NA 131*   < > 132*   < > 131*   < > 132*   < > 133*   < > 133*  --  135* 133* 137  K 4.9   < > 3.8   < > 3.2*   < > 4.9   < > 4.1   < > 4.1   < > 4.9 4.4 4.4  CL 102   < > 102   < > 102   < >  103   < > 103   < > 102   < > 105 101 105  CO2 21*   < > 21*   < > 20*   < > 18*   < > 21*   < > 22   < > 19 18 21   GLUCOSE 226*   < > 297*   < > 212*   < > 256*  --  274*  --  279*  --   --   --   --   BUN 52*   < > 61*   < > 59*   < > 63*   < > 65*   < > 67*  --  25* 25* 29*  CREATININE 1.26*   < > 2.60*   < > 2.63*   < > 2.69*   < > 2.58*   < > 2.79*  --  1.2 1.4* 1.2  CALCIUM 8.9   < > 8.3*   < > 7.9*   < > 8.2*   < > 8.4*   < > 8.3*   < > 9.3 9.4 9.6  MG 1.8  --  1.3*  --  1.9  --   --   --   --   --   --   --   --   --   --   PHOS  --    < > 2.7   < > 2.8   < > 2.9  --  2.5  --  2.5  --   --   --   --    < > = values in this interval not displayed.   Recent Labs    01/11/20 0802 01/11/20 0802 01/24/20 0449 01/24/20 0449 03/04/20 1628 03/08/20 0943 03/17/20 0528 03/18/20 0658 05/04/20 0000  AST 24   < > 15  --  12*  --   --   --  21  ALT 59*   < > 19  --  13  --   --   --  37  ALKPHOS 82   < > 54  --  61  --   --   --  101  BILITOT 0.9  --  0.7  --  1.1  --   --   --   --   PROT 4.4*  --  4.1*  --  6.6  --   --   --   --   ALBUMIN 1.9*   < > 1.7*   < > 2.4*   < > 1.5* 1.5* 2.4*   < > = values in this interval not displayed.   Recent Labs    01/24/20 0449 01/24/20 0449 03/04/20 1628 03/04/20 1748 03/07/20 0352 03/10/20 0531 03/15/20 0102 03/15/20 7253 03/16/20 0529 03/16/20 0529 03/17/20 0528 03/17/20 0528 04/15/20 0000 04/23/20 0000 05/04/20 0000  WBC 2.8*   < > 16.4*   < > 10.6*   < > 8.4   < > 9.6   < > 7.7  --  6.1 7.6 7.1  NEUTROABS 2.1  --  15.3*  --  9.4*  --   --   --   --   --   --   --   --   --   --   HGB 10.1*   < > 11.5*   < > 10.2*   < > 8.8*   < > 8.2*   < > 8.0*   < > 8.2* 7.3* 7.6*  HCT 31.2*   < > 36.0*   < > 32.8*   < > 27.9*   < > 24.7*   < > 25.9*   < > 26* 23* 25*  MCV 83.4   < > 80.9   < > 79.6*   < > 79.7*  --  76.2*  --  79.0*  --   --   --   --   PLT 81*   < > 174   < > 123*   < > 144*   < > 142*   < > 144*   < > 206 219 247   < > =  values in this interval not displayed.    Lab Results  Component Value Date   HGBA1C 10.9 (H) 03/05/2020   Lab Results  Component Value Date   CHOL 224 (H) 01/04/2020   HDL 43 01/04/2020   LDLCALC 164 (H) 01/04/2020   TRIG 87 01/04/2020   CHOLHDL 5.2 01/04/2020    Assessment/Plan  1. Acute prerenal azotemia - thought to be due to poor intake - continue Remeron and Dronabinol to increase appeite -Follow-up with Dr. Illene Silver (nephrology), of [facility]  dementia  2. Renal transplant, status post - mycophenolate (MYFORTIC) 180 MG EC tablet; Take 2 tablets (360 mg total) by mouth 2 (two) times daily.  Dispense: 120 tablet; Refill: 0 - predniSONE (DELTASONE) 2.5 MG tablet; Take 3 tablets (7.5 mg total) by mouth daily. Take 3 tablets to = 7.5 mg  Dispense: 90 tablet; Refill: 0 - tacrolimus (PROGRAF) 0.5 MG capsule; Take 3 capsules (1.5 mg total) by mouth 2 (two) times daily.  Dispense: 180 capsule; Refill: 0  3. Hypomagnesemia - magnesium oxide (MAG-OX) 400 MG tablet; Take 1 tablet (400 mg total) by mouth 2 (two) times daily.  Dispense: 60 tablet; Refill: 0  4. Anemia in chronic kidney disease, unspecified CKD stage Lab Results  Component  Value Date   HGB 7.6 (A) 05/04/2020  - stable  5. Atrial fibrillation, chronic (HCC) -Continue metoprolol tartrate 12.5 mg twice a day - apixaban (ELIQUIS) 5 MG TABS tablet; Take 1 tablet (5 mg total) by mouth 2 (two) times daily.  Dispense: 60 tablet; Refill: 0  6. Controlled type 2 diabetes mellitus with hyperglycemia, without long-term current use of insulin (HCC) Lab Results  Component Value Date   HGBA1C 10.9 (H) 03/05/2020   - insulin glargine (LANTUS SOLOSTAR) 100 UNIT/ML Solostar Pen; Inject 25 Units into the skin at bedtime.  Dispense: 15 mL; Refill: 0 - Insulin Pen Needle 32G X 6 MM MISC; 1 application by Does not apply route daily.  Dispense: 100 each; Refill: 0 - sitaGLIPtin (JANUVIA) 25 MG tablet; Take 1 tablet  (25 mg total) by mouth daily.  Dispense: 30 tablet; Refill: 0  7. Hyperlipidemia associated with type 2 diabetes mellitus (HCC) -Continue atorvastatin 80 mg 1 tab daily  8. Vitamin D deficiency - cholecalciferol (VITAMIN D) 25 MCG (1000 UNIT) tablet; Take 2 tablets (2,000 Units total) by mouth daily.  Dispense: 60 tablet; Refill: 0  9. History of CVA (cerebrovascular accident) -Continue Eliquis 5 mg twice a day - atorvastatin (LIPITOR) 80 MG tablet; Take 1 tablet (80 mg total) by mouth daily at 6 PM.  Dispense: 30 tablet; Refill: 0  10. Benign prostatic hyperplasia without lower urinary tract symptoms - finasteride (PROSCAR) 5 MG tablet; Take 1 tablet (5 mg total) by mouth daily.  Dispense: 30 tablet; Refill: 0 - tamsulosin (FLOMAX) 0.4 MG CAPS capsule; Take 1 capsule (0.4 mg total) by mouth every morning.  Dispense: 30 capsule; Refill: 0  11. Secondary hyperparathyroidism of renal origin ([place]) - cinacalcet (SENSIPAR) 60 MG tablet; Take 1 tablet (60 mg total) by mouth every morning. With Breakfast.  Dispense: 30 tablet; Refill: 0  12. Poor appetite - dronabinol (MARINOL) 5 MG capsule; Take 1 capsule (5 mg total) by mouth 2 (two) times daily before lunch and supper.  Dispense: 60 capsule; Refill: 0 - mirtazapine (REMERON) 15 MG tablet; Take 1 tablet (15 mg total) by mouth at bedtime.  Dispense: 30 tablet; Refill: 0  13. Gastroesophageal reflux disease without esophagitis - famotidine (PEPCID) 10 MG tablet; Take 1 tablet (10 mg total) by mouth daily.  Dispense: 30 tablet; Refill: 0  14. Hypertension secondary to other renal disorders - hydrALAZINE (APRESOLINE) 25 MG tablet; Take 1 tablet (25 mg total) by mouth in the morning, at noon, in the evening, and at bedtime.  Dispense: 120 tablet; Refill: 0 - metoprolol tartrate (LOPRESSOR) 25 MG tablet; Take 0.5 tablets (12.5 mg total) by mouth 2 (two) times daily.  Dispense: 30 tablet; Refill: 0 - NIFEdipine (ADALAT CC) 60 MG 24 hr tablet; Take 1  tablet (60 mg total) by mouth daily.  Dispense: 30 tablet; Refill: 0     I have filled out patient's discharge paperwork and e-prescribed medications.  Patient will receive home health PT, OT, Nursing, Aide and social worker.   Treatment:  Apply skin prep to left heel, cover with foam heel cup and wrapped with Kerlix daily.  DME provided: Standard reclining wheelchair, semielectric hospital bed with half rails, mechanical lift with pad.  Standard reclining wheelchair  -patient suffers from history of CVA/generalized weakness which impairs his ability to perform daily activities like toileting, feeding, dressing, grooming and bathing in the home.  A cane or walker will not resolve issue with performing activities of daily living.  A wheelchair  will allow patient to safely perform daily activities.  Patient has a caregiver who can provide assistance.  Semielectric hospital bed  -patient spends 80% of the time in bed.  Aspiration causes frequent gardening.  Torso requires to be elevated at least 30 degrees or more to prevent aspiration.  Bed wedges do not provide adequate elevation to resolve issue with aspiration.  Gagging/choking requires frequent immediate changes in body position which cannot be achieved with a normal bed.  Total discharge time: Greater than 30 minutes Greater than 50% was spent in coordination of care.   Discharge time involved coordination of the discharge process with social worker, nursing staff and therapy department. Medical justification for home health services/DME verified.   Durenda Age, DNP, MSN, FNP-BC [facility] [phone] (Monday-Friday 8:00 a.m. - 5:00 p.m.) [phone] (after hours)

## 2020-05-22 ENCOUNTER — Other Ambulatory Visit: Payer: Self-pay | Admitting: Adult Health

## 2020-05-25 ENCOUNTER — Other Ambulatory Visit: Payer: Self-pay | Admitting: Adult Health

## 2020-05-25 DIAGNOSIS — E1165 Type 2 diabetes mellitus with hyperglycemia: Secondary | ICD-10-CM

## 2020-05-25 DIAGNOSIS — I482 Chronic atrial fibrillation, unspecified: Secondary | ICD-10-CM

## 2020-05-26 ENCOUNTER — Other Ambulatory Visit: Payer: Self-pay | Admitting: Adult Health

## 2020-05-26 DIAGNOSIS — N2581 Secondary hyperparathyroidism of renal origin: Secondary | ICD-10-CM

## 2020-05-26 DIAGNOSIS — Z940 Kidney transplant status: Secondary | ICD-10-CM

## 2020-05-27 ENCOUNTER — Other Ambulatory Visit: Payer: Self-pay | Admitting: Adult Health

## 2020-06-05 ENCOUNTER — Other Ambulatory Visit: Payer: Self-pay | Admitting: Adult Health

## 2020-06-05 DIAGNOSIS — Z940 Kidney transplant status: Secondary | ICD-10-CM

## 2020-06-06 ENCOUNTER — Other Ambulatory Visit: Payer: Self-pay | Admitting: Adult Health

## 2020-06-06 DIAGNOSIS — Z8673 Personal history of transient ischemic attack (TIA), and cerebral infarction without residual deficits: Secondary | ICD-10-CM

## 2020-06-09 ENCOUNTER — Other Ambulatory Visit: Payer: Self-pay | Admitting: Adult Health

## 2020-06-09 DIAGNOSIS — I482 Chronic atrial fibrillation, unspecified: Secondary | ICD-10-CM

## 2020-06-11 ENCOUNTER — Other Ambulatory Visit: Payer: Self-pay | Admitting: Adult Health

## 2020-06-11 DIAGNOSIS — E1165 Type 2 diabetes mellitus with hyperglycemia: Secondary | ICD-10-CM

## 2020-06-13 ENCOUNTER — Other Ambulatory Visit: Payer: Self-pay | Admitting: Adult Health

## 2020-06-13 DIAGNOSIS — R630 Anorexia: Secondary | ICD-10-CM

## 2020-07-03 ENCOUNTER — Other Ambulatory Visit: Payer: Self-pay | Admitting: Adult Health

## 2020-07-03 DIAGNOSIS — E1165 Type 2 diabetes mellitus with hyperglycemia: Secondary | ICD-10-CM

## 2020-07-05 ENCOUNTER — Other Ambulatory Visit: Payer: Self-pay | Admitting: Adult Health

## 2020-07-05 DIAGNOSIS — R630 Anorexia: Secondary | ICD-10-CM

## 2020-07-07 ENCOUNTER — Other Ambulatory Visit: Payer: Self-pay | Admitting: Physical Medicine and Rehabilitation

## 2020-07-07 DIAGNOSIS — Z8673 Personal history of transient ischemic attack (TIA), and cerebral infarction without residual deficits: Secondary | ICD-10-CM

## 2020-07-19 ENCOUNTER — Other Ambulatory Visit: Payer: Self-pay | Admitting: Adult Health

## 2020-07-19 DIAGNOSIS — E1165 Type 2 diabetes mellitus with hyperglycemia: Secondary | ICD-10-CM

## 2020-07-23 ENCOUNTER — Other Ambulatory Visit: Payer: Self-pay | Admitting: Physical Medicine and Rehabilitation

## 2020-07-23 DIAGNOSIS — Z8673 Personal history of transient ischemic attack (TIA), and cerebral infarction without residual deficits: Secondary | ICD-10-CM

## 2020-08-02 ENCOUNTER — Other Ambulatory Visit: Payer: Self-pay | Admitting: Physical Medicine and Rehabilitation

## 2020-08-02 DIAGNOSIS — Z8673 Personal history of transient ischemic attack (TIA), and cerebral infarction without residual deficits: Secondary | ICD-10-CM

## 2020-08-07 ENCOUNTER — Other Ambulatory Visit: Payer: Self-pay | Admitting: Adult Health

## 2020-08-07 DIAGNOSIS — E1165 Type 2 diabetes mellitus with hyperglycemia: Secondary | ICD-10-CM

## 2020-08-09 ENCOUNTER — Emergency Department (HOSPITAL_COMMUNITY): Payer: Medicare HMO

## 2020-08-09 ENCOUNTER — Inpatient Hospital Stay (HOSPITAL_COMMUNITY)
Admission: EM | Admit: 2020-08-09 | Discharge: 2020-08-15 | DRG: 871 | Disposition: A | Discharge status: Home Health | Payer: Medicare HMO | Attending: Internal Medicine | Admitting: Internal Medicine

## 2020-08-09 ENCOUNTER — Encounter (HOSPITAL_COMMUNITY): Payer: Self-pay | Admitting: Emergency Medicine

## 2020-08-09 ENCOUNTER — Other Ambulatory Visit: Payer: Self-pay

## 2020-08-09 DIAGNOSIS — Z7901 Long term (current) use of anticoagulants: Secondary | ICD-10-CM | POA: Diagnosis not present

## 2020-08-09 DIAGNOSIS — Z1611 Resistance to penicillins: Secondary | ICD-10-CM | POA: Diagnosis present

## 2020-08-09 DIAGNOSIS — E11621 Type 2 diabetes mellitus with foot ulcer: Secondary | ICD-10-CM | POA: Diagnosis present

## 2020-08-09 DIAGNOSIS — J189 Pneumonia, unspecified organism: Secondary | ICD-10-CM | POA: Diagnosis not present

## 2020-08-09 DIAGNOSIS — Z20822 Contact with and (suspected) exposure to covid-19: Secondary | ICD-10-CM | POA: Diagnosis present

## 2020-08-09 DIAGNOSIS — E785 Hyperlipidemia, unspecified: Secondary | ICD-10-CM | POA: Diagnosis present

## 2020-08-09 DIAGNOSIS — L97419 Non-pressure chronic ulcer of right heel and midfoot with unspecified severity: Secondary | ICD-10-CM | POA: Diagnosis present

## 2020-08-09 DIAGNOSIS — L97429 Non-pressure chronic ulcer of left heel and midfoot with unspecified severity: Secondary | ICD-10-CM | POA: Diagnosis present

## 2020-08-09 DIAGNOSIS — R4182 Altered mental status, unspecified: Secondary | ICD-10-CM

## 2020-08-09 DIAGNOSIS — E1121 Type 2 diabetes mellitus with diabetic nephropathy: Secondary | ICD-10-CM | POA: Diagnosis not present

## 2020-08-09 DIAGNOSIS — Z6822 Body mass index (BMI) 22.0-22.9, adult: Secondary | ICD-10-CM

## 2020-08-09 DIAGNOSIS — H409 Unspecified glaucoma: Secondary | ICD-10-CM | POA: Diagnosis present

## 2020-08-09 DIAGNOSIS — R7989 Other specified abnormal findings of blood chemistry: Secondary | ICD-10-CM | POA: Diagnosis not present

## 2020-08-09 DIAGNOSIS — L89892 Pressure ulcer of other site, stage 2: Secondary | ICD-10-CM

## 2020-08-09 DIAGNOSIS — E1169 Type 2 diabetes mellitus with other specified complication: Secondary | ICD-10-CM | POA: Diagnosis present

## 2020-08-09 DIAGNOSIS — E119 Type 2 diabetes mellitus without complications: Secondary | ICD-10-CM

## 2020-08-09 DIAGNOSIS — Z86718 Personal history of other venous thrombosis and embolism: Secondary | ICD-10-CM

## 2020-08-09 DIAGNOSIS — K5900 Constipation, unspecified: Secondary | ICD-10-CM | POA: Diagnosis present

## 2020-08-09 DIAGNOSIS — E1152 Type 2 diabetes mellitus with diabetic peripheral angiopathy with gangrene: Secondary | ICD-10-CM | POA: Diagnosis present

## 2020-08-09 DIAGNOSIS — Z940 Kidney transplant status: Secondary | ICD-10-CM

## 2020-08-09 DIAGNOSIS — I10 Essential (primary) hypertension: Secondary | ICD-10-CM | POA: Diagnosis present

## 2020-08-09 DIAGNOSIS — K8010 Calculus of gallbladder with chronic cholecystitis without obstruction: Secondary | ICD-10-CM | POA: Diagnosis present

## 2020-08-09 DIAGNOSIS — E876 Hypokalemia: Secondary | ICD-10-CM | POA: Diagnosis not present

## 2020-08-09 DIAGNOSIS — L97509 Non-pressure chronic ulcer of other part of unspecified foot with unspecified severity: Secondary | ICD-10-CM

## 2020-08-09 DIAGNOSIS — Z823 Family history of stroke: Secondary | ICD-10-CM

## 2020-08-09 DIAGNOSIS — D509 Iron deficiency anemia, unspecified: Secondary | ICD-10-CM | POA: Diagnosis not present

## 2020-08-09 DIAGNOSIS — K8080 Other cholelithiasis without obstruction: Secondary | ICD-10-CM

## 2020-08-09 DIAGNOSIS — L0390 Cellulitis, unspecified: Secondary | ICD-10-CM | POA: Diagnosis not present

## 2020-08-09 DIAGNOSIS — Z8673 Personal history of transient ischemic attack (TIA), and cerebral infarction without residual deficits: Secondary | ICD-10-CM

## 2020-08-09 DIAGNOSIS — E43 Unspecified severe protein-calorie malnutrition: Secondary | ICD-10-CM | POA: Diagnosis present

## 2020-08-09 DIAGNOSIS — Z87891 Personal history of nicotine dependence: Secondary | ICD-10-CM | POA: Diagnosis not present

## 2020-08-09 DIAGNOSIS — Z91041 Radiographic dye allergy status: Secondary | ICD-10-CM

## 2020-08-09 DIAGNOSIS — I482 Chronic atrial fibrillation, unspecified: Secondary | ICD-10-CM | POA: Diagnosis present

## 2020-08-09 DIAGNOSIS — Z794 Long term (current) use of insulin: Secondary | ICD-10-CM

## 2020-08-09 DIAGNOSIS — L8990 Pressure ulcer of unspecified site, unspecified stage: Secondary | ICD-10-CM | POA: Diagnosis not present

## 2020-08-09 DIAGNOSIS — I96 Gangrene, not elsewhere classified: Secondary | ICD-10-CM

## 2020-08-09 DIAGNOSIS — R509 Fever, unspecified: Secondary | ICD-10-CM | POA: Diagnosis present

## 2020-08-09 DIAGNOSIS — Z91048 Other nonmedicinal substance allergy status: Secondary | ICD-10-CM | POA: Diagnosis not present

## 2020-08-09 DIAGNOSIS — N400 Enlarged prostate without lower urinary tract symptoms: Secondary | ICD-10-CM | POA: Diagnosis present

## 2020-08-09 DIAGNOSIS — E1165 Type 2 diabetes mellitus with hyperglycemia: Secondary | ICD-10-CM | POA: Diagnosis present

## 2020-08-09 DIAGNOSIS — R630 Anorexia: Secondary | ICD-10-CM

## 2020-08-09 DIAGNOSIS — A4152 Sepsis due to Pseudomonas: Secondary | ICD-10-CM | POA: Diagnosis present

## 2020-08-09 DIAGNOSIS — D638 Anemia in other chronic diseases classified elsewhere: Secondary | ICD-10-CM | POA: Diagnosis present

## 2020-08-09 DIAGNOSIS — Z79899 Other long term (current) drug therapy: Secondary | ICD-10-CM

## 2020-08-09 DIAGNOSIS — Z833 Family history of diabetes mellitus: Secondary | ICD-10-CM

## 2020-08-09 DIAGNOSIS — A419 Sepsis, unspecified organism: Secondary | ICD-10-CM | POA: Diagnosis not present

## 2020-08-09 DIAGNOSIS — Z8249 Family history of ischemic heart disease and other diseases of the circulatory system: Secondary | ICD-10-CM

## 2020-08-09 DIAGNOSIS — J309 Allergic rhinitis, unspecified: Secondary | ICD-10-CM | POA: Diagnosis present

## 2020-08-09 DIAGNOSIS — R627 Adult failure to thrive: Secondary | ICD-10-CM | POA: Diagnosis present

## 2020-08-09 DIAGNOSIS — Z7952 Long term (current) use of systemic steroids: Secondary | ICD-10-CM

## 2020-08-09 LAB — CBC WITH DIFFERENTIAL/PLATELET
Abs Immature Granulocytes: 0.08 10*3/uL — ABNORMAL HIGH (ref 0.00–0.07)
Basophils Absolute: 0 10*3/uL (ref 0.0–0.1)
Basophils Relative: 0 %
Eosinophils Absolute: 0 10*3/uL (ref 0.0–0.5)
Eosinophils Relative: 0 %
HCT: 26.4 % — ABNORMAL LOW (ref 39.0–52.0)
Hemoglobin: 7.6 g/dL — ABNORMAL LOW (ref 13.0–17.0)
Immature Granulocytes: 1 %
Lymphocytes Relative: 3 %
Lymphs Abs: 0.3 10*3/uL — ABNORMAL LOW (ref 0.7–4.0)
MCH: 25.2 pg — ABNORMAL LOW (ref 26.0–34.0)
MCHC: 28.8 g/dL — ABNORMAL LOW (ref 30.0–36.0)
MCV: 87.4 fL (ref 80.0–100.0)
Monocytes Absolute: 0.6 10*3/uL (ref 0.1–1.0)
Monocytes Relative: 6 %
Neutro Abs: 8.9 10*3/uL — ABNORMAL HIGH (ref 1.7–7.7)
Neutrophils Relative %: 90 %
Platelets: 164 10*3/uL (ref 150–400)
RBC: 3.02 MIL/uL — ABNORMAL LOW (ref 4.22–5.81)
RDW: 20.4 % — ABNORMAL HIGH (ref 11.5–15.5)
WBC: 9.8 10*3/uL (ref 4.0–10.5)
nRBC: 0 % (ref 0.0–0.2)

## 2020-08-09 LAB — MRSA PCR SCREENING: MRSA by PCR: NEGATIVE

## 2020-08-09 LAB — COMPREHENSIVE METABOLIC PANEL
ALT: 455 U/L — ABNORMAL HIGH (ref 0–44)
AST: 685 U/L — ABNORMAL HIGH (ref 15–41)
Albumin: 1.9 g/dL — ABNORMAL LOW (ref 3.5–5.0)
Alkaline Phosphatase: 517 U/L — ABNORMAL HIGH (ref 38–126)
Anion gap: 11 (ref 5–15)
BUN: 47 mg/dL — ABNORMAL HIGH (ref 8–23)
CO2: 21 mmol/L — ABNORMAL LOW (ref 22–32)
Calcium: 10.3 mg/dL (ref 8.9–10.3)
Chloride: 107 mmol/L (ref 98–111)
Creatinine, Ser: 1.24 mg/dL (ref 0.61–1.24)
GFR, Estimated: >60 mL/min (ref >60)
Glucose, Bld: 200 mg/dL — ABNORMAL HIGH (ref 70–99)
Potassium: 2.9 mmol/L — ABNORMAL LOW (ref 3.5–5.1)
Sodium: 139 mmol/L (ref 135–145)
Total Bilirubin: 1.6 mg/dL — ABNORMAL HIGH (ref 0.3–1.2)
Total Protein: 5.5 g/dL — ABNORMAL LOW (ref 6.5–8.1)

## 2020-08-09 LAB — PROTIME-INR
INR: 2.2 — ABNORMAL HIGH (ref 0.8–1.2)
Prothrombin Time: 23.3 seconds — ABNORMAL HIGH (ref 11.4–15.2)

## 2020-08-09 LAB — LACTIC ACID, PLASMA
Lactic Acid, Venous: 2.1 mmol/L (ref 0.5–1.9)
Lactic Acid, Venous: 2.2 mmol/L (ref 0.5–1.9)

## 2020-08-09 LAB — APTT: aPTT: 39 seconds — ABNORMAL HIGH (ref 24–36)

## 2020-08-09 LAB — MAGNESIUM: Magnesium: 1.7 mg/dL (ref 1.7–2.4)

## 2020-08-09 LAB — CBG MONITORING, ED
Glucose-Capillary: 161 mg/dL — ABNORMAL HIGH (ref 70–99)
Glucose-Capillary: 120 mg/dL — ABNORMAL HIGH (ref 70–99)
Glucose-Capillary: 148 mg/dL — ABNORMAL HIGH (ref 70–99)

## 2020-08-09 LAB — RESPIRATORY PANEL BY RT PCR (FLU A&B, COVID)
Influenza A by PCR: NEGATIVE
Influenza B by PCR: NEGATIVE
SARS Coronavirus 2 by RT PCR: NEGATIVE

## 2020-08-09 LAB — PHOSPHORUS: Phosphorus: 2 mg/dL — ABNORMAL LOW (ref 2.5–4.6)

## 2020-08-09 LAB — AMMONIA: Ammonia: 24 umol/L (ref 9–35)

## 2020-08-09 LAB — GLUCOSE, CAPILLARY: Glucose-Capillary: 132 mg/dL — ABNORMAL HIGH (ref 70–99)

## 2020-08-09 MED ORDER — SODIUM CHLORIDE 0.9 % IV SOLN: 2.0000 g | Freq: Two times a day (BID) | INTRAVENOUS | Status: DC

## 2020-08-09 MED ORDER — POTASSIUM CHLORIDE 10 MEQ/100ML IV SOLN
INTRAVENOUS | Status: AC
  Administered 2020-08-09: 10 meq via INTRAVENOUS
  Filled 2020-08-09: qty 100

## 2020-08-09 MED ORDER — SODIUM CHLORIDE 0.9 % IV SOLN
2.0000 g | Freq: Once | INTRAVENOUS | Status: AC
  Administered 2020-08-09: 2 g via INTRAVENOUS
  Filled 2020-08-09: qty 2

## 2020-08-09 MED ORDER — PIPERACILLIN-TAZOBACTAM 3.375 G IVPB
3.3750 g | Freq: Three times a day (TID) | INTRAVENOUS | Status: DC
  Administered 2020-08-10 – 2020-08-11 (×4): 3.375 g via INTRAVENOUS
  Filled 2020-08-09 (×4): qty 50

## 2020-08-09 MED ORDER — HEPARIN (PORCINE) 25000 UT/250ML-% IV SOLN
1000.0000 [IU]/h | INTRAVENOUS | Status: DC
  Administered 2020-08-09: 1000 [IU]/h via INTRAVENOUS
  Administered 2020-08-10: 900 [IU]/h via INTRAVENOUS
  Administered 2020-08-12: 750 [IU]/h via INTRAVENOUS
  Filled 2020-08-09 (×3): qty 250

## 2020-08-09 MED ORDER — TAMSULOSIN HCL 0.4 MG PO CAPS
0.4000 mg | ORAL_CAPSULE | ORAL | Status: DC
  Administered 2020-08-09 – 2020-08-15 (×7): 0.4 mg via ORAL
  Filled 2020-08-09 (×7): qty 1

## 2020-08-09 MED ORDER — VANCOMYCIN HCL IN DEXTROSE 1-5 GM/200ML-% IV SOLN: 1000.0000 mg | INTRAVENOUS | Status: DC

## 2020-08-09 MED ORDER — SODIUM CHLORIDE 0.9 % IV SOLN
2.0000 g | Freq: Three times a day (TID) | INTRAVENOUS | Status: DC
  Administered 2020-08-09: 2 g via INTRAVENOUS
  Filled 2020-08-09: qty 2

## 2020-08-09 MED ORDER — SODIUM CHLORIDE 0.9 % IV SOLN: 2.0000 g | Freq: Once | INTRAVENOUS | Status: DC

## 2020-08-09 MED ORDER — METRONIDAZOLE IN NACL 5-0.79 MG/ML-% IV SOLN
500.0000 mg | Freq: Three times a day (TID) | INTRAVENOUS | Status: DC
  Administered 2020-08-09: 500 mg via INTRAVENOUS
  Filled 2020-08-09: qty 100

## 2020-08-09 MED ORDER — INSULIN ASPART 100 UNIT/ML ~~LOC~~ SOLN
0.0000 [IU] | Freq: Three times a day (TID) | SUBCUTANEOUS | Status: DC
  Administered 2020-08-09: 1 [IU] via SUBCUTANEOUS
  Administered 2020-08-09: 2 [IU] via SUBCUTANEOUS
  Administered 2020-08-10: 1 [IU] via SUBCUTANEOUS
  Administered 2020-08-11: 7 [IU] via SUBCUTANEOUS
  Administered 2020-08-11: 5 [IU] via SUBCUTANEOUS
  Administered 2020-08-11: 7 [IU] via SUBCUTANEOUS
  Administered 2020-08-12: 2 [IU] via SUBCUTANEOUS
  Administered 2020-08-12: 1 [IU] via SUBCUTANEOUS
  Administered 2020-08-12: 2 [IU] via SUBCUTANEOUS
  Administered 2020-08-14: 1 [IU] via SUBCUTANEOUS

## 2020-08-09 MED ORDER — BRIMONIDINE TARTRATE 0.2 % OP SOLN
1.0000 [drp] | Freq: Two times a day (BID) | OPHTHALMIC | Status: DC
  Administered 2020-08-09 – 2020-08-15 (×12): 1 [drp] via OPHTHALMIC
  Filled 2020-08-09: qty 5

## 2020-08-09 MED ORDER — POTASSIUM CHLORIDE IN NACL 40-0.9 MEQ/L-% IV SOLN
INTRAVENOUS | Status: AC
  Filled 2020-08-09: qty 1000

## 2020-08-09 MED ORDER — ACETAMINOPHEN 325 MG PO TABS: 650.0000 mg | ORAL_TABLET | Freq: Four times a day (QID) | ORAL | Status: DC | PRN

## 2020-08-09 MED ORDER — ONDANSETRON HCL 4 MG PO TABS: 4.0000 mg | ORAL_TABLET | Freq: Four times a day (QID) | ORAL | Status: DC | PRN

## 2020-08-09 MED ORDER — SODIUM CHLORIDE 0.9 % IV BOLUS
1000.0000 mL | Freq: Once | INTRAVENOUS | Status: AC
  Administered 2020-08-09: 1000 mL via INTRAVENOUS

## 2020-08-09 MED ORDER — APIXABAN 5 MG PO TABS: 5.0000 mg | ORAL_TABLET | Freq: Two times a day (BID) | ORAL | Status: DC

## 2020-08-09 MED ORDER — VANCOMYCIN VARIABLE DOSE PER UNSTABLE RENAL FUNCTION (PHARMACIST DOSING): Status: DC

## 2020-08-09 MED ORDER — VANCOMYCIN HCL 1500 MG/300ML IV SOLN
1500.0000 mg | Freq: Once | INTRAVENOUS | Status: AC
  Administered 2020-08-09: 1500 mg via INTRAVENOUS
  Filled 2020-08-09: qty 300

## 2020-08-09 MED ORDER — POTASSIUM CHLORIDE 10 MEQ/100ML IV SOLN
10.0000 meq | INTRAVENOUS | Status: AC
  Administered 2020-08-09 (×5): 10 meq via INTRAVENOUS
  Filled 2020-08-09 (×5): qty 100

## 2020-08-09 MED ORDER — MAGNESIUM SULFATE 2 GM/50ML IV SOLN
2.0000 g | Freq: Once | INTRAVENOUS | Status: AC
  Administered 2020-08-09: 2 g via INTRAVENOUS
  Filled 2020-08-09: qty 50

## 2020-08-09 MED ORDER — ONDANSETRON HCL 4 MG/2ML IJ SOLN: 4.0000 mg | Freq: Four times a day (QID) | INTRAMUSCULAR | Status: DC | PRN

## 2020-08-09 MED ORDER — PREDNISONE 5 MG PO TABS
7.5000 mg | ORAL_TABLET | Freq: Every day | ORAL | Status: DC
  Administered 2020-08-09 – 2020-08-12 (×4): 7.5 mg via ORAL
  Filled 2020-08-09 (×3): qty 2
  Filled 2020-08-09: qty 1

## 2020-08-09 MED ORDER — DORZOLAMIDE HCL-TIMOLOL MAL 2-0.5 % OP SOLN
1.0000 [drp] | Freq: Two times a day (BID) | OPHTHALMIC | Status: DC
  Administered 2020-08-09 – 2020-08-15 (×12): 1 [drp] via OPHTHALMIC
  Filled 2020-08-09: qty 10

## 2020-08-09 MED ORDER — SODIUM CHLORIDE 0.9 % IV SOLN
3.0000 g | Freq: Four times a day (QID) | INTRAVENOUS | Status: DC
  Administered 2020-08-09 (×2): 3 g via INTRAVENOUS
  Filled 2020-08-09: qty 8
  Filled 2020-08-09: qty 3
  Filled 2020-08-09: qty 8

## 2020-08-09 MED ORDER — POTASSIUM CHLORIDE 10 MEQ/100ML IV SOLN: 10.0000 meq | INTRAVENOUS | Status: DC

## 2020-08-09 MED ORDER — SODIUM BICARBONATE 650 MG PO TABS
1300.0000 mg | ORAL_TABLET | Freq: Two times a day (BID) | ORAL | Status: DC
  Administered 2020-08-09 – 2020-08-15 (×13): 1300 mg via ORAL
  Filled 2020-08-09 (×15): qty 2

## 2020-08-09 MED ORDER — K PHOS MONO-SOD PHOS DI & MONO 155-852-130 MG PO TABS
500.0000 mg | ORAL_TABLET | Freq: Three times a day (TID) | ORAL | Status: AC
  Administered 2020-08-09 (×3): 500 mg via ORAL
  Filled 2020-08-09 (×4): qty 2

## 2020-08-09 MED ORDER — VANCOMYCIN HCL IN DEXTROSE 1-5 GM/200ML-% IV SOLN: 1000.0000 mg | Freq: Once | INTRAVENOUS | Status: DC

## 2020-08-09 NOTE — Progress Notes (Signed)
ANTICOAGULATION CONSULT NOTE - Initial Consult  Pharmacy Consult for heparin Indication: atrial fibrillation  Allergies  Allergen Reactions  . Iodinated Diagnostic Agents Hives  . Tape Hives    Patient Measurements: Height: 5\' 11"  (180.3 cm) Weight: 72.6 kg (160 lb) IBW/kg (Calculated) : 75.3 Heparin Dosing Weight: 72.6kg  Vital Signs: Temp: 97.9 F (36.6 C) (11/14 1211) Temp Source: Oral (11/14 1211) BP: 147/68 (11/14 1345) Pulse Rate: 82 (11/14 1345)  Labs: Recent Labs    08/09/20 0105  HGB 7.6*  HCT 26.4*  PLT 164  APTT 39*  LABPROT 23.3*  INR 2.2*  CREATININE 1.24    Estimated Creatinine Clearance: 57.7 mL/min (by C-G formula based on SCr of 1.24 mg/dL).   Medical History: Past Medical History:  Diagnosis Date  . Allergic rhinitis   . BPH (benign prostatic hyperplasia)   . Chronic atrial fibrillation ([place])   . Diabetes mellitus ([place])   . DVT (deep venous thrombosis) (HCC)    LLE  . Hematochezia   . HTN (hypertension)   . Hypomagnesemia 03/20/2019  . Kidney transplant recipient   . PVD (peripheral vascular disease) (HCC)     Medications:  Infusions:  . heparin    . piperacillin-tazobactam (ZOSYN)  IV    . potassium chloride      Assessment: 56 yom presented to the ED with AMS. He is on chronic apixaban for history of afib but this will be transitioned to IV heparin. Last dose was last night. Baseline Hgb is low at 7.4 and platelets are WNL. No bleeding noted.   Goal of Therapy:  Heparin level 0.3-0.7 units/ml aPTT 66-102 seconds Monitor platelets by anticoagulation protocol: Yes   Plan:  Heparin gtt 1000 units/hr Check an 8 hr heparin level/aPTT Daily heparin level, aPTT, CBC  Ryan Rice, Ryan Rice 08/09/2020,2:13 PM

## 2020-08-09 NOTE — Progress Notes (Signed)
Pt being followed for Sepsis Protocol.

## 2020-08-09 NOTE — H&P (Signed)
History and Physical    Ryan Rice JYN:[id number] DOB: 1951-02-01 DOA: 08/09/2020  PCP: Mina Marble, PA-C  Patient coming from: Home.  I have personally briefly reviewed patient's old medical records in [place]  Chief Complaint: Altered mental status.  HPI: Ryan Rice is a 69 y.o. male with medical history significant of allergic rhinitis, BPH, glaucoma, type 2 diabetes, hyperlipidemia, history of DVT, hypertension, chronic atrial fibrillation, history of embolic CVA, history of hypomagnesemia, kidney transplant recipient, peripheral vascular disease who was brought to the emergency department with complaints of altered mental status and fever.  His wife states that earlier in the day he was having abdominal pain, felt constipated and she gave him a stool softeners without significant relief.  Later the patient started feeling cold and shivering.  This was followed by decreased mentation.  His mental status has improved and he is able to answer some questions.  He had abdominal pain earlier in the week and felt nauseous, but this was self-limited.  He has been having RUQ pain for some time now, but does not know how long.  ED Course: Initial vital signs were temperature 100 F, pulse 86, respirations 17, blood pressure 123/56 mmHg and O2 sat 99% on room air.  The patient received a 1000 mL NS bolus, potassium supplementation, cefepime, metronidazole or equivalent along with vancomycin per pharmacy dosing.  Labs: CBC showed a white count of 9.8 with 90% neutrophils, hemoglobin 7.6 g/dL and platelets 164.  PT 23.3, INR 2.2 and PTT 39.  CMP shows a potassium of 2.9 and CO2 of 21 mmol/L.  The rest of the CMP electrolytes are normal.  Anion gap was 11.  Glucose 200, BUN 47 and creatinine 1.24 mg/dL.  Total protein 5.5, albumin 1.9 g/dL.  AST 685, ALT 455 and alkaline phosphatase 517 units/L.  Total bilirubin was 1.6 mg/dL.  Lactic acid was 2.1 and then 2.2 mmol/L.  Phosphorus 2.0  and magnesium 1.7 mg/dL.  Tacrolimus level is pending.  Blood cultures x2 were drawn.  Imaging: His 1 view chest radiograph shows suspected left basilar pneumonic infiltrate.  [place] of RUQ showed cholelithiasis with borderline gallbladder wall thickening and small amount of pericholecystic fluid.  Review of Systems: As per HPI otherwise all other systems reviewed and are negative.  Past Medical History:  Diagnosis Date  . Allergic rhinitis   . BPH (benign prostatic hyperplasia)   . Chronic atrial fibrillation ([place])   . Diabetes mellitus ([place])   . DVT (deep venous thrombosis) ([facility])    LLE  . Hematochezia   . HTN (hypertension)   . Hypomagnesemia 03/20/2019  . Kidney transplant recipient   . PVD (peripheral vascular disease) ([place])    Past Surgical History:  Procedure Laterality Date  . KIDNEY TRANSPLANT     Social History  reports that he quit smoking about 36 years ago. His smoking use included cigarettes. He has never used smokeless tobacco. He reports previous alcohol use. He reports that he does not use drugs.  Allergies  Allergen Reactions  . Iodinated Diagnostic Agents Hives  . Tape Hives   Family History  Problem Relation Age of Onset  . Stroke Mother   . Heart disease Father   . Diabetes Father   . Diabetes Sister   . Diabetes Brother   . Heart disease Brother    Prior to Admission medications   Medication Sig Start Date End Date Taking? Authorizing Provider  acetaminophen (TYLENOL) 325 MG tablet Take 650 mg by  mouth every 6 (six) hours as needed.    [provider]  apixaban (ELIQUIS) 5 MG TABS tablet Take 1 tablet (5 mg total) by mouth 2 (two) times daily. 05/05/20   Medina-Vargas, Monina C, NP  artificial tears (LACRILUBE) OINT ophthalmic ointment Place into the left eye at bedtime as needed for dry eyes. 05/05/20   Medina-Vargas, Monina C, NP  atorvastatin (LIPITOR) 80 MG tablet Take 1 tablet (80 mg total) by mouth daily at 6 PM. 05/05/20   Medina-Vargas,  Monina C, NP  brimonidine (ALPHAGAN) 0.2 % ophthalmic solution Place 1 drop into the right eye 2 (two) times daily. 05/05/20   Medina-Vargas, Monina C, NP  cholecalciferol (VITAMIN D) 25 MCG (1000 UNIT) tablet Take 2 tablets (2,000 Units total) by mouth daily. 05/05/20   Medina-Vargas, Monina C, NP  cinacalcet (SENSIPAR) 60 MG tablet Take 1 tablet (60 mg total) by mouth every morning. With Breakfast. 05/05/20   Medina-Vargas, Monina C, NP  dorzolamide-timolol (COSOPT) 22.3-6.8 MG/ML ophthalmic solution Place 1 drop into the right eye 2 (two) times daily. 05/05/20   Medina-Vargas, Monina C, NP  famotidine (PEPCID) 10 MG tablet Take 1 tablet (10 mg total) by mouth daily. 05/05/20   Medina-Vargas, Monina C, NP  finasteride (PROSCAR) 5 MG tablet Take 1 tablet (5 mg total) by mouth daily. 05/05/20   Medina-Vargas, Monina C, NP  folic acid (FOLVITE) 1 MG tablet Take 1 tablet (1 mg total) by mouth daily. 05/05/20   Medina-Vargas, Monina C, NP  Glucerna (GLUCERNA) LIQD Take 237 mLs by mouth daily.    [provider]  hydrALAZINE (APRESOLINE) 25 MG tablet Take 1 tablet (25 mg total) by mouth in the morning, at noon, in the evening, and at bedtime. 05/05/20   Medina-Vargas, Monina C, NP  insulin glargine (LANTUS SOLOSTAR) 100 UNIT/ML Solostar Pen Inject 25 Units into the skin at bedtime. 05/05/20   Medina-Vargas, Monina C, NP  Insulin Pen Needle 32G X 6 MM MISC 1 application by Does not apply route daily. 05/05/20   Medina-Vargas, Monina C, NP  magnesium oxide (MAG-OX) 400 MG tablet Take 1 tablet (400 mg total) by mouth 2 (two) times daily. 05/05/20   Medina-Vargas, Monina C, NP  metoprolol tartrate (LOPRESSOR) 25 MG tablet Take 0.5 tablets (12.5 mg total) by mouth 2 (two) times daily. 05/05/20   Medina-Vargas, Monina C, NP  mirtazapine (REMERON) 15 MG tablet Take 1 tablet (15 mg total) by mouth at bedtime. 05/05/20   Medina-Vargas, Monina C, NP  Multiple Vitamins-Minerals (THERA M PLUS PO) Take 1 tablet by mouth  daily.    [provider]  mycophenolate (MYFORTIC) 180 MG EC tablet Take 2 tablets (360 mg total) by mouth 2 (two) times daily. 05/05/20   Medina-Vargas, Monina C, NP  NIFEdipine (ADALAT CC) 60 MG 24 hr tablet Take 1 tablet (60 mg total) by mouth daily. 05/05/20   Medina-Vargas, Monina C, NP  predniSONE (DELTASONE) 2.5 MG tablet Take 3 tablets (7.5 mg total) by mouth daily. Take 3 tablets to = 7.5 mg 05/05/20   Medina-Vargas, Monina C, NP  sitaGLIPtin (JANUVIA) 25 MG tablet Take 1 tablet (25 mg total) by mouth daily. 05/05/20   Medina-Vargas, Monina C, NP  sodium bicarbonate 650 MG tablet Take 2 tablets (1,300 mg total) by mouth 2 (two) times daily. 05/05/20   Medina-Vargas, Monina C, NP  sulfamethoxazole-trimethoprim (BACTRIM DS) 800-160 MG tablet Take 1 tablet by mouth 3 (three) times a week. Monday, Wednesday, and Friday. 05/06/20  Medina-Vargas, Monina C, NP  tacrolimus (PROGRAF) 0.5 MG capsule Take 3 capsules (1.5 mg total) by mouth 2 (two) times daily. 05/05/20   Medina-Vargas, Monina C, NP  tamsulosin (FLOMAX) 0.4 MG CAPS capsule Take 1 capsule (0.4 mg total) by mouth every morning. 05/05/20   Medina-Vargas, Jaymes Graff C, NP   Physical Exam: Vitals:   08/09/20 0057 08/09/20 0100 08/09/20 0245 08/09/20 0300  BP: (!) 123/56  (!) 108/59 (!) 97/53  Pulse: 86  71 77  Resp: 17  (!) 22 14  Temp: 100 F (37.8 C)     TempSrc: Oral     SpO2: 99%  100% 99%  Weight:  72.6 kg    Height:  5\' 11"  (1.803 m)     Constitutional: Looks chronically ill, but currently in NAD.  Nontoxic. Eyes: PERRL, lids and conjunctivae pale anicteric. ENMT: Mucous membranes are mildly dry. Posterior pharynx clear of any exudate or lesions. Neck: normal, supple, no masses, no thyromegaly Respiratory: Decreased breath sounds in bases, otherwise clear to auscultation bilaterally, no wheezing, no crackles. Normal respiratory effort. No accessory muscle use.  Cardiovascular: Regular rate and rhythm, 2/6 SEM, rubs /  gallops. No extremity edema. 2+ pedal pulses. No carotid bruits.  Abdomen: Nondistended.  Bowel sounds positive.  Soft, mild RUQ tenderness without guarding or rebound, no masses palpated. No hepatosplenomegaly. Musculoskeletal: no clubbing / cyanosis. Good ROM, no contractures. Normal muscle tone.  Skin: Stage II or III perineal and several stage II ulcers in sacral, RLE posterolateral and both heels.  The patient also has ulcerated big toes.  Please see pictures below. Neurologic: CN 2-12 grossly intact.  Decreased sensation distally in lower extremities.  Moves all extremities. Psychiatric: Alert and oriented x 2.  Partially oriented to situation.   Sacral area #1   Sacral area photo #2.   Postero-lateral RLE           Labs on Admission: I have personally reviewed following labs and imaging studies  CBC: Recent Labs  Lab 08/09/20 0105  WBC 9.8  NEUTROABS 8.9*  HGB 7.6*  HCT 26.4*  MCV 87.4  PLT 809    Basic Metabolic Panel: Recent Labs  Lab 08/09/20 0105  NA 139  K 2.9*  CL 107  CO2 21*  GLUCOSE 200*  BUN 47*  CREATININE 1.24  CALCIUM 10.3    GFR: Estimated Creatinine Clearance: 57.7 mL/min (by C-G formula based on SCr of 1.24 mg/dL).  Liver Function Tests: Recent Labs  Lab 08/09/20 0105  AST 685*  ALT 455*  ALKPHOS 517*  BILITOT 1.6*  PROT 5.5*  ALBUMIN 1.9*    Radiological Exams on Admission: US Abdomen Limited  Result Date: 08/09/2020 CLINICAL DATA:  Elevated LFTs EXAM: ULTRASOUND ABDOMEN LIMITED RIGHT UPPER QUADRANT COMPARISON:  None. FINDINGS: Gallbladder: Multiple gallbladder calculi. Small amount of pericholecystic fluid. Mild gallbladder wall thickening. Sonographic Percell Miller sign could not be assessed due to patient altered mental status. Common bile duct: Diameter: 7 mm. Liver: Trace perihepatic free fluid. Portal vein is patent on color Doppler imaging with normal direction of blood flow towards the liver. Other: None. IMPRESSION:  Cholelithiasis with borderline gallbladder wall thickening and small amount of pericholecystic fluid. Electronically Signed   By: Ulyses Jarred M.D.   On: 08/09/2020 05:09   DG Chest Portable 1 View  Result Date: 08/09/2020 CLINICAL DATA:  Fever, altered mental status EXAM: PORTABLE CHEST 1 VIEW COMPARISON:  03/04/2020 FINDINGS: The patient is mildly rotated on this semi erect portable chest radiograph.  Despite this, mild left basilar airspace infiltrate is suspected. The chin overlies the lung apices. No definite pneumothorax or pleural effusion. Cardiac size is mildly enlarged, stable since prior examination. Pulmonary vascularity is normal. No acute bone abnormality. IMPRESSION: Suspected left basilar pneumonic infiltrate. Electronically Signed   By: Fidela Salisbury MD   On: 08/09/2020 01:54   01/03/2020 echocardiogram IMPRESSIONS   1. Left ventricular ejection fraction, by estimation, is 55 to 60%. The  left ventricle has normal function. The left ventricle has no regional  wall motion abnormalities. There is mild concentric left ventricular  hypertrophy. Left ventricular diastolic  function could not be evaluated.  2. Right ventricular systolic function is normal. The right ventricular  size is normal. Tricuspid regurgitation signal is inadequate for assessing  PA pressure.  3. Left atrial size was moderately dilated.  4. Right atrial size was mild to moderately dilated.  5. The mitral valve is normal in structure. No evidence of mitral valve  regurgitation. No evidence of mitral stenosis.  6. The aortic valve is tricuspid. Aortic valve regurgitation is not  visualized. Mild aortic valve stenosis.  EKG: Independently reviewed.  Assessment/Plan Principal problem:   Sepsis due to undetermined organism POA [facility]) Cholecystitis/early cholangitis?  LLL infiltrate or pressure ulcers? Continue IV fluids. N.p.o. except for certain meds. Hold antihypertensives while BP is soft. Hold  atorvastatin due to abnormal LFTs. Hold immunosuppressants, but prednisone. Continue broad-spectrum IV antibiotics. Follow blood cultures and sensitivity. Follow-up CBC and CMP. General surgery suggested HIDA scan. CCS will be consulting later today.  Active Problems:   Pressure ulcers of skin of multiple topographic sites POA No discharge or surrounding erythema. Consult wound/ostomy care team. On broad-spectrum IV antibiotic therapy.    Hypokalemia Replacing. Magnesium was supplemented. Follow-up potassium level.    Hypophosphatemia Supplementing. Follow-up level as needed.    Kidney transplant recipient Hold tacrolimus and mycophenolate. Continue prednisone 7.5 mg p.o. daily.    Type 2 diabetes mellitus (HCC) Currently NPO. Hold Lantus. CBG monitoring with R ISS.    Glaucoma Continue Alphagan and Cosopt drops.    Atrial fibrillation, chronic (HCC) CHA?DS?-VASc Score of at least 6. Hold metoprolol since he is bradycardic. Continue apixaban.    HTN (hypertension) Hold antihypertensives. Monitor blood pressure.    Hyperlipidemia associated with type 2 diabetes mellitus ([place]) Holding statin due to abnormal LFTs.    BPH (benign prostatic hyperplasia) Continue Flomax and Proscar.    Microcytic anemia Monitor H&H. Transfuse as needed.     DVT prophylaxis: On apixaban. Code Status:   Full code. Family Communication:  I spoke to his wife [name] at [phone] Disposition Plan:   Patient is from:  Home.  Anticipated DC to:  Home.  Anticipated DC date:  11/16 2021.  Anticipated DC barriers: Clinical status.  Consults called: Admission status:  Observation/telemetry.   Severity of Illness: High due to sepsis with multiple possible sources with history of renal transplantation and immunosuppressant therapy.  Reubin Milan MD [facility]  How to contact the [facility] Attending or Consulting provider [place] or covering provider during after hours  [place], for this patient?   1. Check the care team in [facility] and look for a) attending/consulting [facility] provider listed and b) the [facility] team listed 2. Log into [email or web address] and use [place]'s universal password to access. If you do not have the password, please contact the hospital operator. 3. Locate the [facility] provider you are looking for under [facility] and page to  a number that you can be directly reached. 4. If you still have difficulty reaching the provider, please page the [facility] (Director on Call) for the Hospitalists listed on amion for assistance.  08/09/2020, 5:55 AM   This document was prepared using Dragon voice recognition software and may contain some unintended transcription errors.

## 2020-08-09 NOTE — ED Notes (Signed)
Pt stated that he is unable to urinate. Pt using a condom cath and no urine presently in the bag. Pt bladder scanned. Highest amount scanned was 438 ml. Informed Ariel - RN.

## 2020-08-09 NOTE — ED Triage Notes (Signed)
Pt. From home transported via EMS c/o fever of 102 and altered mental status since 6pm. On  EMS arrival, b/p 100/50 and temp reading 105. No seizure like activity. EMS administered 250cc NS and 1000mg  Tylenol.   Hx:  CVA with R side deficits Kidney transplant with L arm fistula  Decubitus ulcers on sacral and heels of feet  Bed bound   Last set of Vitals B/P: 120/50 HR 115 AFIB RR 28  SP02: 94% RA Temp: 103 CBG 132

## 2020-08-09 NOTE — Progress Notes (Signed)
PROGRESS NOTE    Ryan Rice  HLK:[id number] DOB: 12-04-1950 DOA: 08/09/2020 PCP: Ryan Marble, PA-C   Brief Narrative:  HPI on 08/09/2020 by Dr. Tennis Must Rutilio Rice is a 69 y.o. male with medical history significant of allergic rhinitis, BPH, glaucoma, type 2 diabetes, hyperlipidemia, history of DVT, hypertension, chronic atrial fibrillation, history of embolic CVA, history of hypomagnesemia, kidney transplant recipient, peripheral vascular disease who was brought to the emergency department with complaints of altered mental status and fever.  His wife states that earlier in the day he was having abdominal pain, felt constipated and she gave him a stool softeners without significant relief.  Later the patient started feeling cold and shivering.  This was followed by decreased mentation.  His mental status has improved and he is able to answer some questions.  He had abdominal pain earlier in the week and felt nauseous, but this was self-limited.  He has been having RUQ pain for some time now, but does not know how long.  Assessment & Plan   Admitted earlier today by Dr. Olevia Rice, please see H&P for full details.  Possible pneumonia -Patient was admitted with sepsis secondary to unknown source however patient did not have sirs criteria only elevated lactic acid level of 2.2.  As of now, sepsis ruled out-however patient is immune compromised -Chest x-ray: Reviewed appears patient may have a left infiltrate -Will continue vancomycin and Zosyn -Blood cultures pending  Acute metabolic encepalopathy -Currently alert and oriented x 2 -Suspect secondary to the infection -Ammonia level 24 -Continue to monitor  Elevated LFTs/cholelithiasis, possible cholecystitis -AST 685, ALT 155, alk phos 517 -Abdominal ultrasound obtained showing cholelithiasis with borderline gallbladder wall thickening and small amount of pericholecystic fluid -General surgery consulted and appreciate -Pending  HIDA scan  Multiple pressure skin ulcers -Present on admission -Wound care consulted -Do not appear to be infected  Hypokalemia -Replacing, continue to monitor  Hypophosphatemia -Replacing, continue to monitor  Kidney transplant -Currently on prednisone -Tacrolimus and mycophenolate currently held  Diabetes mellitus, type II -Currently n.p.o. -Lantus held -Continue insulin sliding scale CBG monitoring  Glaucoma -Continue eyedrops  Atrial fibrillation, chronic -CHA2DS2-VASc score at least 6 -Metoprolol held as patient is currently bradycardic -Will hold Eliquis in case of any procedure and placed on heparin  Essential hypertension -Medications currently held, continue to monitor BP and treat accordingly  Hyperlipidemia -Statin currently held due to elevated LFTs  BPH -Continue Flomax, Proscar  Microcytic anemia -chronic  -Baseline hemoglobin approximately 7-8, currently 7.6 -Monitor CBC  DVT Prophylaxis  Eliquis --> Heparin  Code Status: Full  Family Communication: none at bedside  Disposition Plan:  Status is: Inpatient  Remains inpatient appropriate because:Altered mental status, Ongoing diagnostic testing needed not appropriate for outpatient work up, IV treatments appropriate due to intensity of illness or inability to take PO and Inpatient level of care appropriate due to severity of illness   Dispo: The patient is from: Home              Anticipated d/c is to: TBD              Anticipated d/c date is: 3 days              Patient currently is not medically stable to d/c.   Consultants General surgery  Procedures  Abdominal ultrasound  Antibiotics   Anti-infectives (From admission, onward)   Start     Dose/Rate Route Frequency Ordered Stop   08/10/20 0600  vancomycin (VANCOCIN)  IVPB 1000 mg/200 mL premix  Status:  Discontinued        1,000 mg 200 mL/hr over 60 Minutes Intravenous Every 24 hours 08/09/20 0516 08/09/20 1208   08/09/20 2200   ceFEPIme (MAXIPIME) 2 g in sodium chloride 0.9 % 100 mL IVPB        2 g 200 mL/hr over 30 Minutes Intravenous Every 12 hours 08/09/20 1209     08/09/20 1208  vancomycin variable dose per unstable renal function (pharmacist dosing)         Does not apply See admin instructions 08/09/20 1208     08/09/20 1000  ceFEPIme (MAXIPIME) 2 g in sodium chloride 0.9 % 100 mL IVPB  Status:  Discontinued        2 g 200 mL/hr over 30 Minutes Intravenous Every 8 hours 08/09/20 0514 08/09/20 1209   08/09/20 0600  Ampicillin-Sulbactam (UNASYN) 3 g in sodium chloride 0.9 % 100 mL IVPB       Note to Pharmacy: Adjust antibiotics as needed in the absence of metronidazole. TY.   3 g 200 mL/hr over 30 Minutes Intravenous Every 6 hours 08/09/20 0502     08/09/20 0515  ceFEPIme (MAXIPIME) 2 g in sodium chloride 0.9 % 100 mL IVPB  Status:  Discontinued        2 g 200 mL/hr over 30 Minutes Intravenous  Once 08/09/20 0502 08/09/20 0514   08/09/20 0515  vancomycin (VANCOREADY) IVPB 1500 mg/300 mL        1,500 mg 150 mL/hr over 120 Minutes Intravenous  Once 08/09/20 0502 08/09/20 0803   08/09/20 0115  ceFEPIme (MAXIPIME) 2 g in sodium chloride 0.9 % 100 mL IVPB        2 g 200 mL/hr over 30 Minutes Intravenous  Once 08/09/20 0105 08/09/20 0229   08/09/20 0115  metroNIDAZOLE (FLAGYL) IVPB 500 mg  Status:  Discontinued        500 mg 100 mL/hr over 60 Minutes Intravenous Every 8 hours 08/09/20 0105 08/09/20 0511   08/09/20 0115  vancomycin (VANCOCIN) IVPB 1000 mg/200 mL premix  Status:  Discontinued        1,000 mg 200 mL/hr over 60 Minutes Intravenous  Once 08/09/20 0105 08/09/20 0108   08/09/20 0115  vancomycin (VANCOREADY) IVPB 1500 mg/300 mL        1,500 mg 150 mL/hr over 120 Minutes Intravenous  Once 08/09/20 0108 08/09/20 5681      Subjective:   Ryan Rice seen and examined today.  Patient feels very sleepy.  Denies current chest pain, abdominal pain, dizziness or headache.  States he is in the hospital for  pneumonia.  Objective:   Vitals:   08/09/20 1215 08/09/20 1230 08/09/20 1245 08/09/20 1300  BP: 131/70 139/71 (!) 142/72 135/65  Pulse: 69 74 73 66  Resp: _0 Temp:      TempSrc:      SpO2: 98% 99% 100% 98%  Weight:      Height:        Intake/Output Summary (Last 24 hours) at 08/09/2020 1349 Last data filed at 08/09/2020 1020 Gross per 24 hour  Intake 550 ml  Output --  Net 550 ml   Filed Weights   08/09/20 0100  Weight: 72.6 kg    Admitted today, see H&P   Data Reviewed: I have personally reviewed following labs and imaging studies  CBC: Recent Labs  Lab 08/09/20 0105  WBC 9.8  NEUTROABS 8.9*  HGB 7.6*  HCT 26.4*  MCV 87.4  PLT 782   Basic Metabolic Panel: Recent Labs  Lab 08/09/20 0105 08/09/20 0520  NA 139  --   K 2.9*  --   CL 107  --   CO2 21*  --   GLUCOSE 200*  --   BUN 47*  --   CREATININE 1.24  --   CALCIUM 10.3  --   MG  --  1.7  PHOS  --  2.0*   GFR: Estimated Creatinine Clearance: 57.7 mL/min (by C-G formula based on SCr of 1.24 mg/dL). Liver Function Tests: Recent Labs  Lab 08/09/20 0105  AST 685*  ALT 455*  ALKPHOS 517*  BILITOT 1.6*  PROT 5.5*  ALBUMIN 1.9*   No results for input(s): LIPASE, AMYLASE in the last 168 hours. Recent Labs  Lab 08/09/20 0608  AMMONIA 24   Coagulation Profile: Recent Labs  Lab 08/09/20 0105  INR 2.2*   Cardiac Enzymes: No results for input(s): CKTOTAL, CKMB, CKMBINDEX, TROPONINI in the last 168 hours. BNP (last 3 results) No results for input(s): PROBNP in the last 8760 hours. HbA1C: No results for input(s): HGBA1C in the last 72 hours. CBG: Recent Labs  Lab 08/09/20 0733  GLUCAP 161*   Lipid Profile: No results for input(s): CHOL, HDL, LDLCALC, TRIG, CHOLHDL, LDLDIRECT in the last 72 hours. Thyroid Function Tests: No results for input(s): TSH, T4TOTAL, FREET4, T3FREE, THYROIDAB in the last 72 hours. Anemia Panel: No results for input(s): VITAMINB12, FOLATE,  FERRITIN, TIBC, IRON, RETICCTPCT in the last 72 hours. Urine analysis:    Component Value Date/Time   COLORURINE YELLOW 03/10/2020 1103   APPEARANCEUR CLOUDY (A) 03/10/2020 1103   LABSPEC 1.012 03/10/2020 1103   PHURINE 5.0 03/10/2020 1103   GLUCOSEU 150 (A) 03/10/2020 1103   HGBUR MODERATE (A) 03/10/2020 1103   BILIRUBINUR NEGATIVE 03/10/2020 1103   KETONESUR NEGATIVE 03/10/2020 1103   PROTEINUR >=300 (A) 03/10/2020 1103   NITRITE NEGATIVE 03/10/2020 1103   LEUKOCYTESUR LARGE (A) 03/10/2020 1103   Sepsis Labs: _0 (procalcitonin:4,lacticidven:4)  ) Recent Results (from the past 240 hour(s))  Culture, blood (x 2)     Status: None (Preliminary result)   Collection Time: 08/09/20  1:47 AM   Specimen: BLOOD  Result Value Ref Range Status   Specimen Description BLOOD LEFT ANTECUBITAL  Final   Special Requests   Final    BOTTLES DRAWN AEROBIC AND ANAEROBIC Blood Culture adequate volume   Culture   Final    NO GROWTH < 12 HOURS Performed at [facility], [place] [address], [place], [place] [address]    Report Status PENDING  Incomplete  Respiratory Panel by RT PCR (Flu A&B, Covid) - Nasopharyngeal Swab     Status: None   Collection Time: 08/09/20  7:14 AM   Specimen: Nasopharyngeal Swab  Result Value Ref Range Status   SARS Coronavirus 2 by RT PCR NEGATIVE NEGATIVE Final    Comment: (NOTE) SARS-CoV-2 target nucleic acids are NOT DETECTED.  The SARS-CoV-2 RNA is generally detectable in upper respiratoy specimens during the acute phase of infection. The lowest concentration of SARS-CoV-2 viral copies this assay can detect is 131 copies/mL. A negative result does not preclude SARS-Cov-2 infection and should not be used as the sole basis for treatment or other patient management decisions. A negative result may occur with  improper specimen collection/handling, submission of specimen other than nasopharyngeal swab, presence of viral mutation(s) within the areas  targeted by this assay, and inadequate number  of viral copies (<131 copies/mL). A negative result must be combined with clinical observations, patient history, and epidemiological information. The expected result is Negative.  Fact Sheet for Patients:  PinkCheek.be  Fact Sheet for Healthcare Providers:  GravelBags.it  This test is no t yet approved or cleared by the [place] FDA and  has been authorized for detection and/or diagnosis of SARS-CoV-2 by FDA under an Emergency Use Authorization (EUA). This EUA will remain  in effect (meaning this test can be used) for the duration of the COVID-19 declaration under Section 564(b)(1) of the Act, 21 U.S.C. section 360bbb-3(b)(1), unless the authorization is terminated or revoked sooner.     Influenza A by PCR NEGATIVE NEGATIVE Final   Influenza B by PCR NEGATIVE NEGATIVE Final    Comment: (NOTE) The Xpert Xpress SARS-CoV-2/FLU/RSV assay is intended as an aid in  the diagnosis of influenza from Nasopharyngeal swab specimens and  should not be used as a sole basis for treatment. Nasal washings and  aspirates are unacceptable for Xpert Xpress SARS-CoV-2/FLU/RSV  testing.  Fact Sheet for Patients: PinkCheek.be  Fact Sheet for Healthcare Providers: GravelBags.it  This test is not yet approved or cleared by the [place] FDA and  has been authorized for detection and/or diagnosis of SARS-CoV-2 by  FDA under an Emergency Use Authorization (EUA). This EUA will remain  in effect (meaning this test can be used) for the duration of the  Covid-19 declaration under Section 564(b)(1) of the Act, 21  U.S.C. section 360bbb-3(b)(1), unless the authorization is  terminated or revoked. Performed at [facility], [place] [address], [place], [place] [address]       Radiology Studies: US Abdomen Limited  Result Date:  08/09/2020 CLINICAL DATA:  Elevated LFTs EXAM: ULTRASOUND ABDOMEN LIMITED RIGHT UPPER QUADRANT COMPARISON:  None. FINDINGS: Gallbladder: Multiple gallbladder calculi. Small amount of pericholecystic fluid. Mild gallbladder wall thickening. Sonographic Percell Miller sign could not be assessed due to patient altered mental status. Common bile duct: Diameter: 7 mm. Liver: Trace perihepatic free fluid. Portal vein is patent on color Doppler imaging with normal direction of blood flow towards the liver. Other: None. IMPRESSION: Cholelithiasis with borderline gallbladder wall thickening and small amount of pericholecystic fluid. Electronically Signed   By: Ulyses Jarred M.D.   On: 08/09/2020 05:09   DG Chest Portable 1 View  Result Date: 08/09/2020 CLINICAL DATA:  Fever, altered mental status EXAM: PORTABLE CHEST 1 VIEW COMPARISON:  03/04/2020 FINDINGS: The patient is mildly rotated on this semi erect portable chest radiograph. Despite this, mild left basilar airspace infiltrate is suspected. The chin overlies the lung apices. No definite pneumothorax or pleural effusion. Cardiac size is mildly enlarged, stable since prior examination. Pulmonary vascularity is normal. No acute bone abnormality. IMPRESSION: Suspected left basilar pneumonic infiltrate. Electronically Signed   By: Fidela Salisbury MD   On: 08/09/2020 01:54     Scheduled Meds: . apixaban  5 mg Oral BID  . brimonidine  1 drop Right Eye BID  . dorzolamide-timolol  1 drop Right Eye BID  . insulin aspart  0-9 Units Subcutaneous TID WC  . phosphorus  500 mg Oral TID  . predniSONE  7.5 mg Oral Daily  . sodium bicarbonate  1,300 mg Oral BID  . tamsulosin  0.4 mg Oral [id number]  . vancomycin variable dose per unstable renal function (pharmacist dosing)   Does not apply See admin instructions   Continuous Infusions: . ampicillin-sulbactam (UNASYN) IV Stopped (08/09/20 0746)  . ceFEPime (  MAXIPIME) IV       LOS: 0 days   Time Spent in minutes   45  minutes  Astou Lada D.O. on 08/09/2020 at 1:49 PM  Between 7am to 7pm - Please see pager noted on [email or web address]  After 7pm go to [email or web address]  And look for the night coverage person covering for me after hours  [facility] Office  [phone]

## 2020-08-09 NOTE — ED Provider Notes (Addendum)
[facility]ERGENCY DEPARTMENT Provider Note   CSN: [id number] Arrival date & time: 08/09/20  0047     History Chief Complaint  Patient presents with  . Fever  . Altered Mental Status    Aarib Pulido is a 69 y.o. male.  The history is provided by the EMS personnel. The history is limited by the condition of the patient.  Fever Max temp prior to arrival:  102 at home  Temp source:  Oral Severity:  Moderate Onset quality:  Gradual Duration:  1 day Timing:  Intermittent Progression:  Unchanged Chronicity:  New Relieved by:  Nothing Worsened by:  Nothing Ineffective treatments:  None tried Associated symptoms: confusion   Associated symptoms: no chest pain   Risk factors: no contaminated food   Altered Mental Status Presenting symptoms: confusion   Associated symptoms: fever   Associated symptoms: no abdominal pain        Past Medical History:  Diagnosis Date  . Allergic rhinitis   . BPH (benign prostatic hyperplasia)   . Chronic atrial fibrillation ([place])   . Diabetes mellitus ([place])   . DVT (deep venous thrombosis) (HCC)    LLE  . Hematochezia   . HTN (hypertension)   . Hypomagnesemia 03/20/2019  . Kidney transplant recipient   . PVD (peripheral vascular disease) [facility])     Patient Active Problem List   Diagnosis Date Noted  . Sepsis due to undetermined organism ([place]) 08/09/2020  . BPH (benign prostatic hyperplasia)   . Secondary hyperparathyroidism of renal origin ([place]) 04/10/2020  . Pressure injury of sacral region, stage 2 ([place]) 04/10/2020  . Anemia in chronic kidney disease 04/10/2020  . Vitamin D deficiency 04/10/2020  . Aphasia as late effect of stroke 04/10/2020  . Pressure injury of skin 03/07/2020  . ARF (acute renal failure) ([place]) 03/04/2020  . History of CVA (cerebrovascular accident) 03/04/2020  . Hyperlipidemia associated with type 2 diabetes mellitus ([place]) 03/04/2020  . Cellulitis of left lower extremity 02/13/2020  .  Peripheral edema   . Leukopenia   . Labile blood pressure   . Acute lower UTI   . Leukocytosis   . Thrombocytopenia ([place])   . Hyponatremia   . Steroid-induced hyperglycemia   . Ischemic stroke of frontal lobe ([place]) 01/10/2020  . Embolic infarction ([place]) 01/10/2020  . Controlled type 2 diabetes mellitus with hyperglycemia, without long-term current use of insulin ([place])   . Renal transplant, status post   . HTN (hypertension)   . Cerebral embolism with cerebral infarction 01/03/2020  . Brain tumor ([place]) 12/30/2019  . Hip pain, bilateral 12/30/2019  . Elevated PSA 12/30/2019  . Atrial fibrillation, chronic ([place]) 12/30/2019  . Hypomagnesemia 03/20/2019  . Diabetes ([place]) 03/15/2019  . Glaucoma 03/15/2019  . Scleral hemorrhage of left eye 03/15/2019  . Gout flare 03/15/2019  . Debility 03/08/2019  . AKI (acute kidney injury) ([place])   . CAP (community acquired pneumonia)   . Kidney transplant recipient   . Sepsis ([place]) 02/27/2019    Past Surgical History:  Procedure Laterality Date  . KIDNEY TRANSPLANT         Family History  Problem Relation Age of Onset  . Stroke Mother   . Heart disease Father   . Diabetes Father   . Diabetes Sister   . Diabetes Brother   . Heart disease Brother     Social History   Tobacco Use  . Smoking status: Former Smoker    Types: Cigarettes    Quit  date: 03/07/1984    Years since quitting: 36.4  . Smokeless tobacco: Never Used  . Tobacco comment: N/A  Vaping Use  . Vaping Use: Never used  Substance Use Topics  . Alcohol use: Not Currently    Comment: rare use  . Drug use: Never    Home Medications Prior to Admission medications   Medication Sig Start Date End Date Taking? Authorizing Provider  acetaminophen (TYLENOL) 325 MG tablet Take 650 mg by mouth every 6 (six) hours as needed.    [provider]  apixaban (ELIQUIS) 5 MG TABS tablet Take 1 tablet (5 mg total) by mouth 2 (two) times daily. 05/05/20   Medina-Vargas,  Monina C, NP  artificial tears (LACRILUBE) OINT ophthalmic ointment Place into the left eye at bedtime as needed for dry eyes. 05/05/20   Medina-Vargas, Monina C, NP  atorvastatin (LIPITOR) 80 MG tablet Take 1 tablet (80 mg total) by mouth daily at 6 PM. 05/05/20   Medina-Vargas, Monina C, NP  brimonidine (ALPHAGAN) 0.2 % ophthalmic solution Place 1 drop into the right eye 2 (two) times daily. 05/05/20   Medina-Vargas, Monina C, NP  cholecalciferol (VITAMIN D) 25 MCG (1000 UNIT) tablet Take 2 tablets (2,000 Units total) by mouth daily. 05/05/20   Medina-Vargas, Monina C, NP  cinacalcet (SENSIPAR) 60 MG tablet Take 1 tablet (60 mg total) by mouth every morning. With Breakfast. 05/05/20   Medina-Vargas, Monina C, NP  dorzolamide-timolol (COSOPT) 22.3-6.8 MG/ML ophthalmic solution Place 1 drop into the right eye 2 (two) times daily. 05/05/20   Medina-Vargas, Monina C, NP  famotidine (PEPCID) 10 MG tablet Take 1 tablet (10 mg total) by mouth daily. 05/05/20   Medina-Vargas, Monina C, NP  finasteride (PROSCAR) 5 MG tablet Take 1 tablet (5 mg total) by mouth daily. 05/05/20   Medina-Vargas, Monina C, NP  folic acid (FOLVITE) 1 MG tablet Take 1 tablet (1 mg total) by mouth daily. 05/05/20   Medina-Vargas, Monina C, NP  Glucerna (GLUCERNA) LIQD Take 237 mLs by mouth daily.    [provider]  hydrALAZINE (APRESOLINE) 25 MG tablet Take 1 tablet (25 mg total) by mouth in the morning, at noon, in the evening, and at bedtime. 05/05/20   Medina-Vargas, Monina C, NP  insulin glargine (LANTUS SOLOSTAR) 100 UNIT/ML Solostar Pen Inject 25 Units into the skin at bedtime. 05/05/20   Medina-Vargas, Monina C, NP  Insulin Pen Needle 32G X 6 MM MISC 1 application by Does not apply route daily. 05/05/20   Medina-Vargas, Monina C, NP  magnesium oxide (MAG-OX) 400 MG tablet Take 1 tablet (400 mg total) by mouth 2 (two) times daily. 05/05/20   Medina-Vargas, Monina C, NP  metoprolol tartrate (LOPRESSOR) 25 MG tablet Take 0.5  tablets (12.5 mg total) by mouth 2 (two) times daily. 05/05/20   Medina-Vargas, Monina C, NP  mirtazapine (REMERON) 15 MG tablet Take 1 tablet (15 mg total) by mouth at bedtime. 05/05/20   Medina-Vargas, Monina C, NP  Multiple Vitamins-Minerals (THERA M PLUS PO) Take 1 tablet by mouth daily.    [provider]  mycophenolate (MYFORTIC) 180 MG EC tablet Take 2 tablets (360 mg total) by mouth 2 (two) times daily. 05/05/20   Medina-Vargas, Monina C, NP  NIFEdipine (ADALAT CC) 60 MG 24 hr tablet Take 1 tablet (60 mg total) by mouth daily. 05/05/20   Medina-Vargas, Monina C, NP  predniSONE (DELTASONE) 2.5 MG tablet Take 3 tablets (7.5 mg total) by mouth daily. Take 3 tablets to =  7.5 mg 05/05/20   Medina-Vargas, Monina C, NP  sitaGLIPtin (JANUVIA) 25 MG tablet Take 1 tablet (25 mg total) by mouth daily. 05/05/20   Medina-Vargas, Monina C, NP  sodium bicarbonate 650 MG tablet Take 2 tablets (1,300 mg total) by mouth 2 (two) times daily. 05/05/20   Medina-Vargas, Monina C, NP  sulfamethoxazole-trimethoprim (BACTRIM DS) 800-160 MG tablet Take 1 tablet by mouth 3 (three) times a week. Monday, Wednesday, and Friday. 05/06/20   Medina-Vargas, Monina C, NP  tacrolimus (PROGRAF) 0.5 MG capsule Take 3 capsules (1.5 mg total) by mouth 2 (two) times daily. 05/05/20   Medina-Vargas, Monina C, NP  tamsulosin (FLOMAX) 0.4 MG CAPS capsule Take 1 capsule (0.4 mg total) by mouth every morning. 05/05/20   Medina-Vargas, Monina C, NP    Allergies    Iodinated diagnostic agents and Tape  Review of Systems   Review of Systems  Unable to perform ROS: Mental status change  Constitutional: Positive for fever.  Cardiovascular: Negative for chest pain.  Gastrointestinal: Negative for abdominal pain.  Psychiatric/Behavioral: Positive for confusion.    Physical Exam Updated Vital Signs BP (!) 97/53   Pulse 77   Temp 100 F (37.8 C) (Oral)   Resp 14   Ht 5\' 11"  (1.803 m)   Wt 72.6 kg   SpO2 99%   BMI 22.32 kg/m    Physical Exam Vitals and nursing note reviewed.  Constitutional:      Appearance: Normal appearance. He is not diaphoretic.  HENT:     Head: Normocephalic and atraumatic.     Nose: Nose normal.  Eyes:     Conjunctiva/sclera: Conjunctivae normal.     Pupils: Pupils are equal, round, and reactive to light.  Cardiovascular:     Rate and Rhythm: Normal rate and regular rhythm.     Pulses: Normal pulses.     Heart sounds: Normal heart sounds.  Pulmonary:     Breath sounds: Decreased air movement present.  Abdominal:     General: Abdomen is flat. Bowel sounds are normal.     Palpations: Abdomen is soft.     Tenderness: There is no abdominal tenderness. There is no guarding or rebound.  Musculoskeletal:     Cervical back: Normal range of motion and neck supple.     Right lower leg: No edema.     Left lower leg: No edema.  Lymphadenopathy:     Cervical: No cervical adenopathy.  Skin:    General: Skin is warm.     Capillary Refill: Capillary refill takes less than 2 seconds.       Neurological:     Mental Status: He is alert.     Deep Tendon Reflexes: Reflexes normal.     ED Results / Procedures / Treatments   Labs (all labs ordered are listed, but only abnormal results are displayed) Results for orders placed or performed during the hospital encounter of 08/09/20  CBC with Differential  Result Value Ref Range   WBC 9.8 4.0 - 10.5 K/uL   RBC 3.02 (L) 4.22 - 5.81 MIL/uL   Hemoglobin 7.6 (L) 13.0 - 17.0 g/dL   HCT 26.4 (L) 39 - 52 %   MCV 87.4 80.0 - 100.0 fL   MCH 25.2 (L) 26.0 - 34.0 pg   MCHC 28.8 (L) 30.0 - 36.0 g/dL   RDW 20.4 (H) 11.5 - 15.5 %   Platelets 164 150 - 400 K/uL   nRBC 0.0 0.0 - 0.2 %   Neutrophils Relative %  90 %   Neutro Abs 8.9 (H) 1.7 - 7.7 K/uL   Lymphocytes Relative 3 %   Lymphs Abs 0.3 (L) 0.7 - 4.0 K/uL   Monocytes Relative 6 %   Monocytes Absolute 0.6 0.1 - 1.0 K/uL   Eosinophils Relative 0 %   Eosinophils Absolute 0.0 0.0 - 0.5 K/uL    Basophils Relative 0 %   Basophils Absolute 0.0 0.0 - 0.1 K/uL   Immature Granulocytes 1 %   Abs Immature Granulocytes 0.08 (H) 0.00 - 0.07 K/uL  Comprehensive metabolic panel  Result Value Ref Range   Sodium 139 135 - 145 mmol/L   Potassium 2.9 (L) 3.5 - 5.1 mmol/L   Chloride 107 98 - 111 mmol/L   CO2 21 (L) 22 - 32 mmol/L   Glucose, Bld 200 (H) 70 - 99 mg/dL   BUN 47 (H) 8 - 23 mg/dL   Creatinine, Ser 1.24 0.61 - 1.24 mg/dL   Calcium 10.3 8.9 - 10.3 mg/dL   Total Protein 5.5 (L) 6.5 - 8.1 g/dL   Albumin 1.9 (L) 3.5 - 5.0 g/dL   AST 685 (H) 15 - 41 U/L   ALT 455 (H) 0 - 44 U/L   Alkaline Phosphatase 517 (H) 38 - 126 U/L   Total Bilirubin 1.6 (H) 0.3 - 1.2 mg/dL   GFR, Estimated >60 >60 mL/min   Anion gap 11 5 - 15  Lactic acid, plasma  Result Value Ref Range   Lactic Acid, Venous 2.1 (HH) 0.5 - 1.9 mmol/L  Protime-INR  Result Value Ref Range   Prothrombin Time 23.3 (H) 11.4 - 15.2 seconds   INR 2.2 (H) 0.8 - 1.2  APTT  Result Value Ref Range   aPTT 39 (H) 24 - 36 seconds   US Abdomen Limited  Result Date: 08/09/2020 CLINICAL DATA:  Elevated LFTs EXAM: ULTRASOUND ABDOMEN LIMITED RIGHT UPPER QUADRANT COMPARISON:  None. FINDINGS: Gallbladder: Multiple gallbladder calculi. Small amount of pericholecystic fluid. Mild gallbladder wall thickening. Sonographic Percell Miller sign could not be assessed due to patient altered mental status. Common bile duct: Diameter: 7 mm. Liver: Trace perihepatic free fluid. Portal vein is patent on color Doppler imaging with normal direction of blood flow towards the liver. Other: None. IMPRESSION: Cholelithiasis with borderline gallbladder wall thickening and small amount of pericholecystic fluid. Electronically Signed   By: Ulyses Jarred M.D.   On: 08/09/2020 05:09   DG Chest Portable 1 View  Result Date: 08/09/2020 CLINICAL DATA:  Fever, altered mental status EXAM: PORTABLE CHEST 1 VIEW COMPARISON:  03/04/2020 FINDINGS: The patient is mildly rotated on  this semi erect portable chest radiograph. Despite this, mild left basilar airspace infiltrate is suspected. The chin overlies the lung apices. No definite pneumothorax or pleural effusion. Cardiac size is mildly enlarged, stable since prior examination. Pulmonary vascularity is normal. No acute bone abnormality. IMPRESSION: Suspected left basilar pneumonic infiltrate. Electronically Signed   By: Fidela Salisbury MD   On: 08/09/2020 01:54    EKG None  Radiology DG Chest Portable 1 View  Result Date: 08/09/2020 CLINICAL DATA:  Fever, altered mental status EXAM: PORTABLE CHEST 1 VIEW COMPARISON:  03/04/2020 FINDINGS: The patient is mildly rotated on this semi erect portable chest radiograph. Despite this, mild left basilar airspace infiltrate is suspected. The chin overlies the lung apices. No definite pneumothorax or pleural effusion. Cardiac size is mildly enlarged, stable since prior examination. Pulmonary vascularity is normal. No acute bone abnormality. IMPRESSION: Suspected left basilar pneumonic infiltrate. Electronically Signed  By: Fidela Salisbury MD   On: 08/09/2020 01:54    Procedures Procedures (including critical care time)  Medications Ordered in ED Medications  metroNIDAZOLE (FLAGYL) IVPB 500 mg (0 mg Intravenous Stopped 08/09/20 0355)  vancomycin (VANCOREADY) IVPB 1500 mg/300 mL (1,500 mg Intravenous New Bag/Given 08/09/20 0357)  sodium chloride 0.9 % bolus 1,000 mL (has no administration in time range)  ceFEPIme (MAXIPIME) 2 g in sodium chloride 0.9 % 100 mL IVPB (has no administration in time range)  Ampicillin-Sulbactam (UNASYN) 3 g in sodium chloride 0.9 % 100 mL IVPB (has no administration in time range)  vancomycin (VANCOCIN) IVPB 1000 mg/200 mL premix (has no administration in time range)  ondansetron (ZOFRAN) tablet 4 mg (has no administration in time range)    Or  ondansetron (ZOFRAN) injection 4 mg (has no administration in time range)  ceFEPIme (MAXIPIME) 2 g in  sodium chloride 0.9 % 100 mL IVPB (0 g Intravenous Stopped 08/09/20 0229)    ED Course  I have reviewed the triage vital signs and the nursing notes.  Pertinent labs & imaging results that were available during my care of the patient were reviewed by me and considered in my medical decision making (see chart for details). MDM Reviewed: previous chart, nursing note and vitals Reviewed previous: labs and x-ray Interpretation: labs and x-ray Total time providing critical care: 75-105 minutes (multiple antibiotics and IV potassium ). This excludes time spent performing separately reportable procedures and services. Consults: admitting MD and general surgery  CRITICAL CARE Performed by: Mohamedamin Nifong K Jawann Urbani-Rasch Total critical care time: 75  minutes Critical care time was exclusive of separately billable procedures and treating other patients. Critical care was necessary to treat or prevent imminent or life-threatening deterioration. Critical care was time spent personally by me on the following activities: development of treatment plan with patient and/or surrogate as well as nursing, discussions with consultants, evaluation of patient's response to treatment, examination of patient, obtaining history from patient or surrogate, ordering and performing treatments and interventions, ordering and review of laboratory studies, ordering and review of radiographic studies, pulse oximetry and re-evaluation of patient's condition.    Ryan Rice was evaluated in Emergency Department on 08/09/2020 for the symptoms described in the history of present illness. He was evaluated in the context of the global COVID-19 pandemic, which necessitated consideration that the patient might be at risk for infection with the SARS-CoV-2 virus that causes COVID-19. Institutional protocols and algorithms that pertain to the evaluation of patients at risk for COVID-19 are in a state of rapid change based on information released  by regulatory bodies including the CDC and federal and state organizations. These policies and algorithms were followed during the patient's care in the ED.  Final Clinical Impression(s) / ED Diagnoses Final diagnoses:  Community acquired pneumonia, unspecified laterality  Altered mental status, unspecified altered mental status type  Elevated LFTs   Admit to medicine    Deija Buhrman, MD 08/09/20 Tresa Endo, Azalynn Maxim, MD 08/09/20 1610

## 2020-08-09 NOTE — ED Provider Notes (Signed)
Case d/w Dr. Barry Dienes, HIDA scan and CCS to consult.    HIDA is now ordered   [facility] updated   [place], Geovani Tootle, MD 08/09/20 [phone]

## 2020-08-09 NOTE — Progress Notes (Signed)
Pharmacy Antibiotic Note  Ryan Rice is a 69 y.o. male admitted on 08/09/2020 with sepsis.  Pharmacy has been consulted for vancomycin and cefepime dosing. Cefepime 2gm ordered in ED  Plan: Continue cefepime 2gm IV q8 hours Vancomycin 1500 mg IV x 1 then 1gm IV q24 hours F/u renal function, clinical course and cultures   Height: 5\' 11"  (180.3 cm) Weight: 72.6 kg (160 lb) IBW/kg (Calculated) : 75.3  Temp (24hrs), Avg:100 F (37.8 C), Min:100 F (37.8 C), Max:100 F (37.8 C)  Recent Labs  Lab 08/09/20 0105  WBC 9.8  CREATININE 1.24  LATICACIDVEN 2.1*    Estimated Creatinine Clearance: 57.7 mL/min (by C-G formula based on SCr of 1.24 mg/dL).    Allergies  Allergen Reactions  . Iodinated Diagnostic Agents Hives  . Tape Hives    Thank you for allowing pharmacy to be a part of this patient's care.  Excell Seltzer Poteet 08/09/2020 5:17 AM

## 2020-08-09 NOTE — Progress Notes (Signed)
Pharmacy Antibiotic Note  Ryan Rice is a 69 y.o. male admitted on 08/09/2020 with sepsis.  Pharmacy has been consulted for vancomycin and cefepime dosing. Pt with Tmax 100 and WBC is WNL. SCr is 1.24 and lactic acid is elevated. Pt inadvertently received 2 doses of vancomycin 1500mg  this morning.   Plan: Hold vancomycin - check a random level tomorrow morning and check Scr Change cefepime to 2gm IV Q12H F/u renal fxn, C&S, clinical status and trough at [facility] Asked MD to clarify antibiotic regimen as there is some overalap - awaiting response  Height: 5\' 11"  (180.3 cm) Weight: 72.6 kg (160 lb) IBW/kg (Calculated) : 75.3  Temp (24hrs), Avg:98.9 F (37.2 C), Min:97.8 F (36.6 C), Max:100 F (37.8 C)  Recent Labs  Lab 08/09/20 0105 08/09/20 0520  WBC 9.8  --   CREATININE 1.24  --   LATICACIDVEN 2.1* 2.2*    Estimated Creatinine Clearance: 57.7 mL/min (by C-G formula based on SCr of 1.24 mg/dL).    Allergies  Allergen Reactions  . Iodinated Diagnostic Agents Hives  . Tape Hives    Antimicrobials this admission: Vanc 11/14>> Cefepime 11/14>> Unasyn 11/14>> Flagyl x 1 11/14  Dose adjustments this admission: N/A  Microbiology results: 11/14 BCx: NGTD  Thank you for allowing pharmacy to be a part of this patient's care.  Dennys Guin, Rande Lawman 08/09/2020 12:13 PM

## 2020-08-10 ENCOUNTER — Encounter (HOSPITAL_COMMUNITY): Payer: Medicare HMO

## 2020-08-10 ENCOUNTER — Inpatient Hospital Stay (HOSPITAL_COMMUNITY): Payer: Medicare HMO

## 2020-08-10 DIAGNOSIS — E876 Hypokalemia: Secondary | ICD-10-CM

## 2020-08-10 DIAGNOSIS — D509 Iron deficiency anemia, unspecified: Secondary | ICD-10-CM

## 2020-08-10 DIAGNOSIS — I482 Chronic atrial fibrillation, unspecified: Secondary | ICD-10-CM

## 2020-08-10 DIAGNOSIS — Z940 Kidney transplant status: Secondary | ICD-10-CM

## 2020-08-10 DIAGNOSIS — L8990 Pressure ulcer of unspecified site, unspecified stage: Secondary | ICD-10-CM

## 2020-08-10 DIAGNOSIS — N400 Enlarged prostate without lower urinary tract symptoms: Secondary | ICD-10-CM

## 2020-08-10 LAB — CBC WITH DIFFERENTIAL/PLATELET
Abs Immature Granulocytes: 0.01 10*3/uL (ref 0.00–0.07)
Basophils Absolute: 0 10*3/uL (ref 0.0–0.1)
Basophils Relative: 0 %
Eosinophils Absolute: 0 10*3/uL (ref 0.0–0.5)
Eosinophils Relative: 0 %
HCT: 26 % — ABNORMAL LOW (ref 39.0–52.0)
Hemoglobin: 7.8 g/dL — ABNORMAL LOW (ref 13.0–17.0)
Immature Granulocytes: 0 %
Lymphocytes Relative: 4 %
Lymphs Abs: 0.2 10*3/uL — ABNORMAL LOW (ref 0.7–4.0)
MCH: 25.5 pg — ABNORMAL LOW (ref 26.0–34.0)
MCHC: 30 g/dL (ref 30.0–36.0)
MCV: 85 fL (ref 80.0–100.0)
Monocytes Absolute: 0.2 10*3/uL (ref 0.1–1.0)
Monocytes Relative: 3 %
Neutro Abs: 5.7 10*3/uL (ref 1.7–7.7)
Neutrophils Relative %: 93 %
Platelets: 96 10*3/uL — ABNORMAL LOW (ref 150–400)
RBC: 3.06 MIL/uL — ABNORMAL LOW (ref 4.22–5.81)
RDW: 21.2 % — ABNORMAL HIGH (ref 11.5–15.5)
WBC: 6.2 10*3/uL (ref 4.0–10.5)
nRBC: 0 % (ref 0.0–0.2)

## 2020-08-10 LAB — VANCOMYCIN, RANDOM: Vancomycin Rm: 29

## 2020-08-10 LAB — BLOOD CULTURE ID PANEL (REFLEXED) - BCID2

## 2020-08-10 LAB — PROTIME-INR
INR: 2.2 — ABNORMAL HIGH (ref 0.8–1.2)
Prothrombin Time: 23.8 seconds — ABNORMAL HIGH (ref 11.4–15.2)

## 2020-08-10 LAB — COMPREHENSIVE METABOLIC PANEL
ALT: 285 U/L — ABNORMAL HIGH (ref 0–44)
AST: 213 U/L — ABNORMAL HIGH (ref 15–41)
Albumin: 1.9 g/dL — ABNORMAL LOW (ref 3.5–5.0)
Alkaline Phosphatase: 418 U/L — ABNORMAL HIGH (ref 38–126)
Anion gap: 8 (ref 5–15)
BUN: 39 mg/dL — ABNORMAL HIGH (ref 8–23)
CO2: 18 mmol/L — ABNORMAL LOW (ref 22–32)
Calcium: 10 mg/dL (ref 8.9–10.3)
Chloride: 111 mmol/L (ref 98–111)
Creatinine, Ser: 1.05 mg/dL (ref 0.61–1.24)
GFR, Estimated: >60 mL/min (ref >60)
Glucose, Bld: 141 mg/dL — ABNORMAL HIGH (ref 70–99)
Potassium: 4.9 mmol/L (ref 3.5–5.1)
Sodium: 137 mmol/L (ref 135–145)
Total Bilirubin: 2.6 mg/dL — ABNORMAL HIGH (ref 0.3–1.2)
Total Protein: 5.6 g/dL — ABNORMAL LOW (ref 6.5–8.1)

## 2020-08-10 LAB — APTT
aPTT: 154 seconds — ABNORMAL HIGH (ref 24–36)
aPTT: 38 seconds — ABNORMAL HIGH (ref 24–36)

## 2020-08-10 LAB — GLUCOSE, CAPILLARY
Glucose-Capillary: 89 mg/dL (ref 70–99)
Glucose-Capillary: 127 mg/dL — ABNORMAL HIGH (ref 70–99)
Glucose-Capillary: 228 mg/dL — ABNORMAL HIGH (ref 70–99)

## 2020-08-10 LAB — LACTIC ACID, PLASMA: Lactic Acid, Venous: 1.4 mmol/L (ref 0.5–1.9)

## 2020-08-10 LAB — HEPARIN LEVEL (UNFRACTIONATED): Heparin Unfractionated: >2.2 IU/mL — ABNORMAL HIGH (ref 0.30–0.70)

## 2020-08-10 MED ORDER — TECHNETIUM TC 99M MEBROFENIN IV KIT
4.2000 | PACK | Freq: Once | INTRAVENOUS | Status: AC | PRN
  Administered 2020-08-10: 4.2 via INTRAVENOUS

## 2020-08-10 MED ORDER — ENSURE ENLIVE PO LIQD
237.0000 mL | Freq: Three times a day (TID) | ORAL | Status: DC
  Administered 2020-08-10 – 2020-08-14 (×10): 237 mL via ORAL
  Filled 2020-08-10 (×6): qty 237

## 2020-08-10 MED ORDER — ADULT MULTIVITAMIN W/MINERALS CH
1.0000 | ORAL_TABLET | Freq: Every day | ORAL | Status: DC
  Administered 2020-08-10 – 2020-08-15 (×6): 1 via ORAL
  Filled 2020-08-10 (×6): qty 1

## 2020-08-10 MED ORDER — COLLAGENASE 250 UNIT/GM EX OINT
TOPICAL_OINTMENT | Freq: Every day | CUTANEOUS | Status: DC
  Filled 2020-08-10: qty 30

## 2020-08-10 MED ORDER — ZINC OXIDE 12.8 % EX OINT
TOPICAL_OINTMENT | CUTANEOUS | Status: DC | PRN
  Filled 2020-08-10: qty 56.7

## 2020-08-10 NOTE — Progress Notes (Signed)
PHARMACY - PHYSICIAN COMMUNICATION CRITICAL VALUE ALERT - BLOOD CULTURE IDENTIFICATION (BCID)  Ryan Rice is an 69 y.o. male who presented to [facility] on 08/09/2020 with a chief complaint of sepsis  Assessment:  1/4 BC with Pseudomonas, ? If abdominal source of sepsis  Name of physician (or Provider) Contacted: [place]  Current antibiotics: vanc and zosyn  Changes to prescribed antibiotics recommended:  Cont zosyn  Results for orders placed or performed during the hospital encounter of 08/09/20  Blood Culture ID Panel (Reflexed) (Collected: 08/09/2020  1:47 AM)  Result Value Ref Range   Enterococcus faecalis NOT DETECTED NOT DETECTED   Enterococcus Faecium NOT DETECTED NOT DETECTED   Listeria monocytogenes NOT DETECTED NOT DETECTED   Staphylococcus species NOT DETECTED NOT DETECTED   Staphylococcus aureus (BCID) NOT DETECTED NOT DETECTED   Staphylococcus epidermidis NOT DETECTED NOT DETECTED   Staphylococcus lugdunensis NOT DETECTED NOT DETECTED   Streptococcus species NOT DETECTED NOT DETECTED   Streptococcus agalactiae NOT DETECTED NOT DETECTED   Streptococcus pneumoniae NOT DETECTED NOT DETECTED   Streptococcus pyogenes NOT DETECTED NOT DETECTED   A.calcoaceticus-baumannii NOT DETECTED NOT DETECTED   Bacteroides fragilis NOT DETECTED NOT DETECTED   Enterobacterales NOT DETECTED NOT DETECTED   Enterobacter cloacae complex NOT DETECTED NOT DETECTED   Escherichia coli NOT DETECTED NOT DETECTED   Klebsiella aerogenes NOT DETECTED NOT DETECTED   Klebsiella oxytoca NOT DETECTED NOT DETECTED   Klebsiella pneumoniae NOT DETECTED NOT DETECTED   Proteus species NOT DETECTED NOT DETECTED   Salmonella species NOT DETECTED NOT DETECTED   Serratia marcescens NOT DETECTED NOT DETECTED   Haemophilus influenzae NOT DETECTED NOT DETECTED   Neisseria meningitidis NOT DETECTED NOT DETECTED   Pseudomonas aeruginosa DETECTED (A) NOT DETECTED   Stenotrophomonas maltophilia NOT DETECTED  NOT DETECTED   Candida albicans NOT DETECTED NOT DETECTED   Candida auris NOT DETECTED NOT DETECTED   Candida glabrata NOT DETECTED NOT DETECTED   Candida krusei NOT DETECTED NOT DETECTED   Candida parapsilosis NOT DETECTED NOT DETECTED   Candida tropicalis NOT DETECTED NOT DETECTED   Cryptococcus neoformans/gattii NOT DETECTED NOT DETECTED   CTX-M ESBL NOT DETECTED NOT DETECTED   Carbapenem resistance IMP NOT DETECTED NOT DETECTED   Carbapenem resistance KPC NOT DETECTED NOT DETECTED   Carbapenem resistance NDM NOT DETECTED NOT DETECTED   Carbapenem resistance VIM NOT DETECTED NOT DETECTED    Ryan Rice 08/10/2020  12:48 AM

## 2020-08-10 NOTE — Consult Note (Addendum)
[facility] Nurse Consult Note: Patient receiving care in [facility] [id number] Reason for Consult: Multiple wounds Wound type:  Right great toe: 0.7 cm x 0.8 cm 100% black eschar Right Heel: 1 cm x 5 cm 100% pink moist, no drainage Right lateral LE: 4 cm x 4.5 cm 100% pink Left great toe: 1.8 cm x 2 cm 100% black eschar Left 3rd toe: 0.9 cm x 0.9 cm 100% black Left heel: 4 cm x 5 cm 100% black Left lateral foot: 2 cm x 1 cm with attaching skin then below that is a 0.8 x 1 cm, both 100% black  Groin bilateral upper inner thighs. MASD IAD Pressure Injury POA: Yes Drainage (amount, consistency, odor)  Periwound: very dry and flaky Dressing procedure/placement/frequency:  Clean bilateral LE with soap and water. Pat dry.  Right and left great toes and 3rd toe of left foot apply iodine (in clean supply) and allow to air dry. Apply daily.  Right heel and Right lateral LE apply xeroform gauze Kellie Simmering # 294) cover with 4 x 4 and wrap with kerlex from the foot to just below the knee. Change daily.  Left heel and left lateral foot: apply santyl, cover with moistened gauze and wrap with kerlex. Change daily.  Groin, bilateral upper inner thighs: clean with soap and water, pat dry. Apply triple paste to the entire area. Keep patient clean and dry PRN.  Apply Prevalon boots to both heels.   Would advise ortho or vascular consult for the feet. [place] sent to Dr. Ree Kida for her to place the order if she agrees. Ortho orders will over ride [place] orders for the feet.   Monitor the wound area(s) for worsening of condition such as: Signs/symptoms of infection, increase in size, development of or worsening of odor, development of pain, or increased pain at the affected locations.   Notify the medical team if any of these develop.  Thank you for the consult. [place] nurse will not follow at this time.   Please re-consult the [place] team if needed.  Cathlean Marseilles Tamala Julian, MSN, RN, [place], Lysle Pearl, [facility] Wound Treatment Associate Pager  [phone]

## 2020-08-10 NOTE — Progress Notes (Signed)
PROGRESS NOTE    Paddy Neis  EXB:[id number] DOB: 1951-06-07 DOA: 08/09/2020 PCP: Mina Marble, PA-C   Brief Narrative:  HPI on 08/09/2020 by Dr. Tennis Must Gabe Glace is a 69 y.o. male with medical history significant of allergic rhinitis, BPH, glaucoma, type 2 diabetes, hyperlipidemia, history of DVT, hypertension, chronic atrial fibrillation, history of embolic CVA, history of hypomagnesemia, kidney transplant recipient, peripheral vascular disease who was brought to the emergency department with complaints of altered mental status and fever.  His wife states that earlier in the day he was having abdominal pain, felt constipated and she gave him a stool softeners without significant relief.  Later the patient started feeling cold and shivering.  This was followed by decreased mentation.  His mental status has improved and he is able to answer some questions.  He had abdominal pain earlier in the week and felt nauseous, but this was self-limited.  He has been having RUQ pain for some time now, but does not know how long.  Interim history Patient admitted with AMS. Pending w/up for cholelithiasis.  Will also obtain ABI and ortho consult for LE wounds.  Assessment & Plan   Acute metabolic encepalopathy -Currently alert and oriented x 2 -per wife, patient has had memory issues that is intermittent since his previous stroke -Suspect secondary to the infection -Ammonia level 24 -Continue to monitor  Possible pneumonia -Patient was admitted with sepsis secondary to unknown source however patient did not have sirs criteria only elevated lactic acid level of 2.2.  As of now, sepsis ruled out-however patient is immune compromised -Chest x-ray: Reviewed appears patient may have a left infiltrate -Will continue vancomycin and Zosyn -Blood cultures 1/2 aerobic bottle showing gram-negative rods, Pseudomonas aeruginosa -Covid and influenza negative  Elevated LFTs/cholelithiasis, possible  cholecystitis -AST 685, ALT 455, alk phos 517 on admission- LFTs trending downward -Abdominal ultrasound obtained showing cholelithiasis with borderline gallbladder wall thickening and small amount of pericholecystic fluid -General surgery consulted and appreciate -Pending HIDA scan  Multiple pressure skin ulcers -Present on admission -Wound care consulted -Do not appear to be infected -Patient has been seeing a podiatrist associated with [facility]. -ABI ordered -Orthopedics consulted and appreciated  Hypokalemia -Replacing, continue to monitor  Hypophosphatemia -Resolved with replacement, continue to monitor BMP  Kidney transplant -Currently on prednisone -Tacrolimus and mycophenolate currently held  Diabetes mellitus, type II -Currently n.p.o. for HIDA -Lantus held -Continue insulin sliding scale CBG monitoring  Glaucoma -Continue eyedrops  Atrial fibrillation, chronic -CHA2DS2-VASc score at least 6 -Metoprolol held as patient is currently bradycardic -Eliquis held in case of any procedure and placed on heparin  Essential hypertension -Medications currently held, continue to monitor BP and treat accordingly  Hyperlipidemia -Statin currently held due to elevated LFTs  BPH -Continue Flomax, Proscar  Microcytic anemia -chronic  -Baseline hemoglobin approximately 7-8, currently 7.8 -Monitor CBC  DVT Prophylaxis  Eliquis --> Heparin  Code Status: Full  Family Communication: none at bedside.  Wife via phone  Disposition Plan:  Status is: Inpatient  Remains inpatient appropriate because:Altered mental status, Ongoing diagnostic testing needed not appropriate for outpatient work up, IV treatments appropriate due to intensity of illness or inability to take PO and Inpatient level of care appropriate due to severity of illness   Dispo: The patient is from: Home              Anticipated d/c is to: TBD              Anticipated  d/c date is: 3 days               Patient currently is not medically stable to d/c.   Consultants General surgery  Procedures  Abdominal ultrasound  Antibiotics   Anti-infectives (From admission, onward)   Start     Dose/Rate Route Frequency Ordered Stop   08/10/20 0600  vancomycin (VANCOCIN) IVPB 1000 mg/200 mL premix  Status:  Discontinued        1,000 mg 200 mL/hr over 60 Minutes Intravenous Every 24 hours 08/09/20 0516 08/09/20 1208   08/09/20 2200  ceFEPIme (MAXIPIME) 2 g in sodium chloride 0.9 % 100 mL IVPB  Status:  Discontinued        2 g 200 mL/hr over 30 Minutes Intravenous Every 12 hours 08/09/20 1209 08/09/20 1357   08/09/20 2200  piperacillin-tazobactam (ZOSYN) IVPB 3.375 g        3.375 g 12.5 mL/hr over 240 Minutes Intravenous Every 8 hours 08/09/20 1357     08/09/20 1208  vancomycin variable dose per unstable renal function (pharmacist dosing)         Does not apply See admin instructions 08/09/20 1208     08/09/20 1000  ceFEPIme (MAXIPIME) 2 g in sodium chloride 0.9 % 100 mL IVPB  Status:  Discontinued        2 g 200 mL/hr over 30 Minutes Intravenous Every 8 hours 08/09/20 0514 08/09/20 1209   08/09/20 0600  Ampicillin-Sulbactam (UNASYN) 3 g in sodium chloride 0.9 % 100 mL IVPB  Status:  Discontinued       Note to Pharmacy: Adjust antibiotics as needed in the absence of metronidazole. TY.   3 g 200 mL/hr over 30 Minutes Intravenous Every 6 hours 08/09/20 0502 08/09/20 1357   08/09/20 0515  ceFEPIme (MAXIPIME) 2 g in sodium chloride 0.9 % 100 mL IVPB  Status:  Discontinued        2 g 200 mL/hr over 30 Minutes Intravenous  Once 08/09/20 0502 08/09/20 0514   08/09/20 0515  vancomycin (VANCOREADY) IVPB 1500 mg/300 mL        1,500 mg 150 mL/hr over 120 Minutes Intravenous  Once 08/09/20 0502 08/09/20 0803   08/09/20 0115  ceFEPIme (MAXIPIME) 2 g in sodium chloride 0.9 % 100 mL IVPB        2 g 200 mL/hr over 30 Minutes Intravenous  Once 08/09/20 0105 08/09/20 0229   08/09/20 0115  metroNIDAZOLE  (FLAGYL) IVPB 500 mg  Status:  Discontinued        500 mg 100 mL/hr over 60 Minutes Intravenous Every 8 hours 08/09/20 0105 08/09/20 0511   08/09/20 0115  vancomycin (VANCOCIN) IVPB 1000 mg/200 mL premix  Status:  Discontinued        1,000 mg 200 mL/hr over 60 Minutes Intravenous  Once 08/09/20 0105 08/09/20 0108   08/09/20 0115  vancomycin (VANCOREADY) IVPB 1500 mg/300 mL        1,500 mg 150 mL/hr over 120 Minutes Intravenous  Once 08/09/20 0108 08/09/20 5361      Subjective:   Onur Peretz seen and examined today.  Patient with no complaints this morning.  States he still has abdominal pain intermittently.    Objective:   Vitals:   08/09/20 1900 08/09/20 2302 08/10/20 0346 08/10/20 0813  BP: (!) 161/99 (!) 156/71 135/68 (!) 149/68  Pulse: 76 89 76 81  Resp: 15 16 12 19   Temp:   97.8 F (36.6 C) 98.1 F (36.7 C)  TempSrc:   Oral Oral  SpO2: 100% 100% 100%   Weight:      Height:        Intake/Output Summary (Last 24 hours) at 08/10/2020 0926 Last data filed at 08/09/2020 1020 Gross per 24 hour  Intake 100 ml  Output --  Net 100 ml   Filed Weights   08/09/20 0100  Weight: 72.6 kg    Exam  General: Well developed, chronically ill-appearing, NAD  HEENT: NCAT, mucous membranes moist.   Cardiovascular: S1 S2 auscultated, irregular.  Respiratory: Diminished breath sounds  Abdomen: Soft, nontender, nondistended, + bowel sounds  Extremities: warm dry without cyanosis clubbing or edema.  Feet with multiple wounds  Neuro: AAOx 1 (self only), nonfocal  Psych: appropriate mood and affect    Data Reviewed: I have personally reviewed following labs and imaging studies  CBC: Recent Labs  Lab 08/09/20 0105 08/10/20 0045  WBC 9.8 6.2  NEUTROABS 8.9* 5.7  HGB 7.6* 7.8*  HCT 26.4* 26.0*  MCV 87.4 85.0  PLT 164 96*   Basic Metabolic Panel: Recent Labs  Lab 08/09/20 0105 08/09/20 0520 08/10/20 0045  NA 139  --  137  K 2.9*  --  4.9  CL 107  --  111   CO2 21*  --  18*  GLUCOSE 200*  --  141*  BUN 47*  --  39*  CREATININE 1.24  --  1.05  CALCIUM 10.3  --  10.0  MG  --  1.7  --   PHOS  --  2.0*  --    GFR: Estimated Creatinine Clearance: 67.2 mL/min (by C-G formula based on SCr of 1.05 mg/dL). Liver Function Tests: Recent Labs  Lab 08/09/20 0105 08/10/20 0045  AST 685* 213*  ALT 455* 285*  ALKPHOS 517* 418*  BILITOT 1.6* 2.6*  PROT 5.5* 5.6*  ALBUMIN 1.9* 1.9*   No results for input(s): LIPASE, AMYLASE in the last 168 hours. Recent Labs  Lab 08/09/20 0608  AMMONIA 24   Coagulation Profile: Recent Labs  Lab 08/09/20 0105 08/10/20 0045  INR 2.2* 2.2*   Cardiac Enzymes: No results for input(s): CKTOTAL, CKMB, CKMBINDEX, TROPONINI in the last 168 hours. BNP (last 3 results) No results for input(s): PROBNP in the last 8760 hours. HbA1C: No results for input(s): HGBA1C in the last 72 hours. CBG: Recent Labs  Lab 08/09/20 0733 08/09/20 1402 08/09/20 1829 08/09/20 2311 08/10/20 0808  GLUCAP 161* 120* 148* 132* 89   Lipid Profile: No results for input(s): CHOL, HDL, LDLCALC, TRIG, CHOLHDL, LDLDIRECT in the last 72 hours. Thyroid Function Tests: No results for input(s): TSH, T4TOTAL, FREET4, T3FREE, THYROIDAB in the last 72 hours. Anemia Panel: No results for input(s): VITAMINB12, FOLATE, FERRITIN, TIBC, IRON, RETICCTPCT in the last 72 hours. Urine analysis:    Component Value Date/Time   COLORURINE YELLOW 03/10/2020 1103   APPEARANCEUR CLOUDY (A) 03/10/2020 1103   LABSPEC 1.012 03/10/2020 1103   PHURINE 5.0 03/10/2020 1103   GLUCOSEU 150 (A) 03/10/2020 1103   HGBUR MODERATE (A) 03/10/2020 1103   BILIRUBINUR NEGATIVE 03/10/2020 1103   KETONESUR NEGATIVE 03/10/2020 1103   PROTEINUR >=300 (A) 03/10/2020 1103   NITRITE NEGATIVE 03/10/2020 1103   LEUKOCYTESUR LARGE (A) 03/10/2020 1103   Sepsis Labs: @LABRCNTIP (procalcitonin:4,lacticidven:4)  ) Recent Results (from the past 240 hour(s))  Culture, blood  (x 2)     Status: Abnormal (Preliminary result)   Collection Time: 08/09/20  1:47 AM   Specimen: BLOOD  Result Value Ref Range  Status   Specimen Description BLOOD LEFT ANTECUBITAL  Final   Special Requests   Final    BOTTLES DRAWN AEROBIC AND ANAEROBIC Blood Culture adequate volume   Culture  Setup Time   Final    GRAM NEGATIVE RODS AEROBIC BOTTLE ONLY Organism ID to follow CRITICAL RESULT CALLED TO, READ BACK BY AND VERIFIED WITH: PHRMD L SEAY @0041  08/10/20 BY S GEZAHEGN Performed at [facility], [address] [place], [place] [address]    Culture PSEUDOMONAS AERUGINOSA (A)  Final   Report Status PENDING  Incomplete  Blood Culture ID Panel (Reflexed)     Status: Abnormal   Collection Time: 08/09/20  1:47 AM  Result Value Ref Range Status   Enterococcus faecalis NOT DETECTED NOT DETECTED Final   Enterococcus Faecium NOT DETECTED NOT DETECTED Final   Listeria monocytogenes NOT DETECTED NOT DETECTED Final   Staphylococcus species NOT DETECTED NOT DETECTED Final   Staphylococcus aureus (BCID) NOT DETECTED NOT DETECTED Final   Staphylococcus epidermidis NOT DETECTED NOT DETECTED Final   Staphylococcus lugdunensis NOT DETECTED NOT DETECTED Final   Streptococcus species NOT DETECTED NOT DETECTED Final   Streptococcus agalactiae NOT DETECTED NOT DETECTED Final   Streptococcus pneumoniae NOT DETECTED NOT DETECTED Final   Streptococcus pyogenes NOT DETECTED NOT DETECTED Final   A.calcoaceticus-baumannii NOT DETECTED NOT DETECTED Final   Bacteroides fragilis NOT DETECTED NOT DETECTED Final   Enterobacterales NOT DETECTED NOT DETECTED Final   Enterobacter cloacae complex NOT DETECTED NOT DETECTED Final   Escherichia coli NOT DETECTED NOT DETECTED Final   Klebsiella aerogenes NOT DETECTED NOT DETECTED Final   Klebsiella oxytoca NOT DETECTED NOT DETECTED Final   Klebsiella pneumoniae NOT DETECTED NOT DETECTED Final   Proteus species NOT DETECTED NOT DETECTED Final   Salmonella  species NOT DETECTED NOT DETECTED Final   Serratia marcescens NOT DETECTED NOT DETECTED Final   Haemophilus influenzae NOT DETECTED NOT DETECTED Final   Neisseria meningitidis NOT DETECTED NOT DETECTED Final   Pseudomonas aeruginosa DETECTED (A) NOT DETECTED Final    Comment: CRITICAL RESULT CALLED TO, READ BACK BY AND VERIFIED WITH: PHRMD L SEAY @0041  08/10/20 BY S GEZAHEGN    Stenotrophomonas maltophilia NOT DETECTED NOT DETECTED Final   Candida albicans NOT DETECTED NOT DETECTED Final   Candida auris NOT DETECTED NOT DETECTED Final   Candida glabrata NOT DETECTED NOT DETECTED Final   Candida krusei NOT DETECTED NOT DETECTED Final   Candida parapsilosis NOT DETECTED NOT DETECTED Final   Candida tropicalis NOT DETECTED NOT DETECTED Final   Cryptococcus neoformans/gattii NOT DETECTED NOT DETECTED Final   CTX-M ESBL NOT DETECTED NOT DETECTED Final   Carbapenem resistance IMP NOT DETECTED NOT DETECTED Final   Carbapenem resistance KPC NOT DETECTED NOT DETECTED Final   Carbapenem resistance NDM NOT DETECTED NOT DETECTED Final   Carbapenem resistance VIM NOT DETECTED NOT DETECTED Final    Comment: Performed at [facility], [address] [place], [place] [address]  Respiratory Panel by RT PCR (Flu A&B, Covid) - Nasopharyngeal Swab     Status: None   Collection Time: 08/09/20  7:14 AM   Specimen: Nasopharyngeal Swab  Result Value Ref Range Status   SARS Coronavirus 2 by RT PCR NEGATIVE NEGATIVE Final    Comment: (NOTE) SARS-CoV-2 target nucleic acids are NOT DETECTED.  The SARS-CoV-2 RNA is generally detectable in upper respiratoy specimens during the acute phase of infection. The lowest concentration of SARS-CoV-2 viral copies this assay can detect is  131 copies/mL. A negative result does not preclude SARS-Cov-2 infection and should not be used as the sole basis for treatment or other patient management decisions. A negative result may occur with  improper specimen  collection/handling, submission of specimen other than nasopharyngeal swab, presence of viral mutation(s) within the areas targeted by this assay, and inadequate number of viral copies (<131 copies/mL). A negative result must be combined with clinical observations, patient history, and epidemiological information. The expected result is Negative.  Fact Sheet for Patients:  PinkCheek.be  Fact Sheet for Healthcare Providers:  GravelBags.it  This test is no t yet approved or cleared by the [place] FDA and  has been authorized for detection and/or diagnosis of SARS-CoV-2 by FDA under an Emergency Use Authorization (EUA). This EUA will remain  in effect (meaning this test can be used) for the duration of the COVID-19 declaration under Section 564(b)(1) of the Act, 21 U.S.C. section 360bbb-3(b)(1), unless the authorization is terminated or revoked sooner.     Influenza A by PCR NEGATIVE NEGATIVE Final   Influenza B by PCR NEGATIVE NEGATIVE Final    Comment: (NOTE) The Xpert Xpress SARS-CoV-2/FLU/RSV assay is intended as an aid in  the diagnosis of influenza from Nasopharyngeal swab specimens and  should not be used as a sole basis for treatment. Nasal washings and  aspirates are unacceptable for Xpert Xpress SARS-CoV-2/FLU/RSV  testing.  Fact Sheet for Patients: PinkCheek.be  Fact Sheet for Healthcare Providers: GravelBags.it  This test is not yet approved or cleared by the [place] FDA and  has been authorized for detection and/or diagnosis of SARS-CoV-2 by  FDA under an Emergency Use Authorization (EUA). This EUA will remain  in effect (meaning this test can be used) for the duration of the  Covid-19 declaration under Section 564(b)(1) of the Act, 21  U.S.C. section 360bbb-3(b)(1), unless the authorization is  terminated or revoked. Performed at [facility], [place] [address], [place], [place] [address]   MRSA PCR Screening     Status: None   Collection Time: 08/09/20  8:23 PM   Specimen: Nasal Mucosa; Nasopharyngeal  Result Value Ref Range Status   MRSA by PCR NEGATIVE NEGATIVE Final    Comment:        The GeneXpert MRSA Assay (FDA approved for NASAL specimens only), is one component of a comprehensive MRSA colonization surveillance program. It is not intended to diagnose MRSA infection nor to guide or monitor treatment for MRSA infections. Performed at [facility], [place] [address], [place], [place] [address]       Radiology Studies: US Abdomen Limited  Result Date: 08/09/2020 CLINICAL DATA:  Elevated LFTs EXAM: ULTRASOUND ABDOMEN LIMITED RIGHT UPPER QUADRANT COMPARISON:  None. FINDINGS: Gallbladder: Multiple gallbladder calculi. Small amount of pericholecystic fluid. Mild gallbladder wall thickening. Sonographic Percell Miller sign could not be assessed due to patient altered mental status. Common bile duct: Diameter: 7 mm. Liver: Trace perihepatic free fluid. Portal vein is patent on color Doppler imaging with normal direction of blood flow towards the liver. Other: None. IMPRESSION: Cholelithiasis with borderline gallbladder wall thickening and small amount of pericholecystic fluid. Electronically Signed   By: Ulyses Jarred M.D.   On: 08/09/2020 05:09   DG Chest Portable 1 View  Result Date: 08/09/2020 CLINICAL DATA:  Fever, altered mental status EXAM: PORTABLE CHEST 1 VIEW COMPARISON:  03/04/2020 FINDINGS: The patient is mildly rotated on this semi erect portable chest radiograph. Despite this, mild left basilar airspace infiltrate  is suspected. The chin overlies the lung apices. No definite pneumothorax or pleural effusion. Cardiac size is mildly enlarged, stable since prior examination. Pulmonary vascularity is normal. No acute bone abnormality. IMPRESSION: Suspected left basilar pneumonic infiltrate. Electronically Signed    By: Fidela Salisbury MD   On: 08/09/2020 01:54     Scheduled Meds: . brimonidine  1 drop Right Eye BID  . collagenase   Topical Daily  . dorzolamide-timolol  1 drop Right Eye BID  . insulin aspart  0-9 Units Subcutaneous TID WC  . predniSONE  7.5 mg Oral Daily  . sodium bicarbonate  1,300 mg Oral BID  . tamsulosin  0.4 mg Oral [id number]  . vancomycin variable dose per unstable renal function (pharmacist dosing)   Does not apply See admin instructions   Continuous Infusions: . heparin 850 Units/hr (08/10/20 0205)  . piperacillin-tazobactam (ZOSYN)  IV 3.375 g (08/10/20 0020)     LOS: 1 day   Time Spent in minutes   45 minutes  Nakiesha Rumsey D.O. on 08/10/2020 at 9:26 AM  Between 7am to 7pm - Please see pager noted on [email or web address]  After 7pm go to [email or web address]  And look for the night coverage person covering for me after hours  [facility] Office  [phone]

## 2020-08-10 NOTE — Progress Notes (Signed)
Pharmacy Antibiotic Note  Ryan Rice is a 69 y.o. male admitted on 08/09/2020 with sepsis.    Pseudomonas in blood - continues on zosyn and vanc  Vanc random lvl 29 SCr 1.05  Height: 5\' 11"  (180.3 cm) Weight: 72.6 kg (160 lb) IBW/kg (Calculated) : 75.3  Temp (24hrs), Avg:98 F (36.7 C), Min:97.8 F (36.6 C), Max:98.2 F (36.8 C)  Recent Labs  Lab 08/09/20 0105 08/09/20 0520 08/10/20 0045  WBC 9.8  --  6.2  CREATININE 1.24  --  1.05  LATICACIDVEN 2.1* 2.2*  --   VANCORANDOM  --   --  29    Estimated Creatinine Clearance: 67.2 mL/min (by C-G formula based on SCr of 1.05 mg/dL).    Allergies  Allergen Reactions  . Iodinated Diagnostic Agents Hives  . Tape Hives   Plan: Hold vancomycin - will recheck wed am if continues Continue zosyn 3.375 gm IV q8h Monitor renal fx cx lvls prn  Barth Kirks, PharmD, BCPS, BCCCP Clinical Pharmacist [phone]  Please check AMION for all [place] numbers  08/10/2020 9:12 AM

## 2020-08-10 NOTE — Plan of Care (Signed)
  Problem: Education: Goal: Knowledge of General Education information will improve Description: Including pain rating scale, medication(s)/side effects and non-pharmacologic comfort measures Outcome: Progressing   Problem: Health Behavior/Discharge Planning: Goal: Ability to manage health-related needs will improve Outcome: Progressing   Problem: Clinical Measurements: Goal: Will remain free from infection Outcome: Progressing Goal: Diagnostic test results will improve Outcome: Progressing Goal: Respiratory complications will improve Outcome: Progressing   Problem: Nutrition: Goal: Adequate nutrition will be maintained Outcome: Progressing   Problem: Coping: Goal: Level of anxiety will decrease Outcome: Progressing   Problem: Elimination: Goal: Will not experience complications related to bowel motility Outcome: Progressing   Problem: Pain Managment: Goal: General experience of comfort will improve Outcome: Progressing   Problem: Safety: Goal: Ability to remain free from injury will improve Outcome: Progressing

## 2020-08-10 NOTE — Plan of Care (Signed)
  Problem: Clinical Measurements: Goal: Respiratory complications will improve Outcome: Progressing   Problem: Clinical Measurements: Goal: Cardiovascular complication will be avoided Outcome: Progressing   Problem: Nutrition: Goal: Adequate nutrition will be maintained Outcome: Progressing   Problem: Coping: Goal: Level of anxiety will decrease Outcome: Progressing   Problem: Pain Managment: Goal: General experience of comfort will improve Outcome: Progressing   

## 2020-08-10 NOTE — Consult Note (Signed)
Reason for Consult:Foot ulcers Referring Physician: Arlo Rice is an 69 y.o. male.  HPI: Ryan Rice was admitted with AMS yesterday. He seems recovered from that though still is not a very good historian. He tells me he's been dealing with ulcers on his feet for some months and has been seeing podiatry in [place] for that. I see a referral to podiatry in [place] but no actual visit. He denies pain currently.  Past Medical History:  Diagnosis Date  . Allergic rhinitis   . BPH (benign prostatic hyperplasia)   . Chronic atrial fibrillation ([place])   . Diabetes mellitus ([place])   . DVT (deep venous thrombosis) ([facility])    LLE  . Hematochezia   . HTN (hypertension)   . Hypomagnesemia 03/20/2019  . Kidney transplant recipient   . PVD (peripheral vascular disease) ([place])     Past Surgical History:  Procedure Laterality Date  . KIDNEY TRANSPLANT      Family History  Problem Relation Age of Onset  . Stroke Mother   . Heart disease Father   . Diabetes Father   . Diabetes Sister   . Diabetes Brother   . Heart disease Brother     Social History:  reports that he quit smoking about 36 years ago. His smoking use included cigarettes. He has never used smokeless tobacco. He reports previous alcohol use. He reports that he does not use drugs.  Allergies:  Allergies  Allergen Reactions  . Iodinated Diagnostic Agents Hives  . Tape Hives    Medications: I have reviewed the patient's current medications.  Results for orders placed or performed during the hospital encounter of 08/09/20 (from the past 48 hour(s))  CBC with Differential     Status: Abnormal   Collection Time: 08/09/20  1:05 AM  Result Value Ref Range   WBC 9.8 4.0 - 10.5 K/uL   RBC 3.02 (L) 4.22 - 5.81 MIL/uL   Hemoglobin 7.6 (L) 13.0 - 17.0 g/dL   HCT 26.4 (L) 39 - 52 %   MCV 87.4 80.0 - 100.0 fL   MCH 25.2 (L) 26.0 - 34.0 pg   MCHC 28.8 (L) 30.0 - 36.0 g/dL   RDW 20.4 (H) 11.5 - 15.5 %   Platelets 164 150 -  400 K/uL   nRBC 0.0 0.0 - 0.2 %   Neutrophils Relative % 90 %   Neutro Abs 8.9 (H) 1.7 - 7.7 K/uL   Lymphocytes Relative 3 %   Lymphs Abs 0.3 (L) 0.7 - 4.0 K/uL   Monocytes Relative 6 %   Monocytes Absolute 0.6 0.1 - 1.0 K/uL   Eosinophils Relative 0 %   Eosinophils Absolute 0.0 0.0 - 0.5 K/uL   Basophils Relative 0 %   Basophils Absolute 0.0 0.0 - 0.1 K/uL   Immature Granulocytes 1 %   Abs Immature Granulocytes 0.08 (H) 0.00 - 0.07 K/uL    Comment: Performed at [facility], [address] [place], [place] [address]  Comprehensive metabolic panel     Status: Abnormal   Collection Time: 08/09/20  1:05 AM  Result Value Ref Range   Sodium 139 135 - 145 mmol/L   Potassium 2.9 (L) 3.5 - 5.1 mmol/L   Chloride 107 98 - 111 mmol/L   CO2 21 (L) 22 - 32 mmol/L   Glucose, Bld 200 (H) 70 - 99 mg/dL    Comment: Glucose reference range applies only to samples taken after fasting for at least 8 hours.  BUN 47 (H) 8 - 23 mg/dL   Creatinine, Ser 1.24 0.61 - 1.24 mg/dL   Calcium 10.3 8.9 - 10.3 mg/dL   Total Protein 5.5 (L) 6.5 - 8.1 g/dL   Albumin 1.9 (L) 3.5 - 5.0 g/dL   AST 685 (H) 15 - 41 U/L   ALT 455 (H) 0 - 44 U/L   Alkaline Phosphatase 517 (H) 38 - 126 U/L   Total Bilirubin 1.6 (H) 0.3 - 1.2 mg/dL   GFR, Estimated >60 >60 mL/min    Comment: (NOTE) Calculated using the CKD-EPI Creatinine Equation (2021)    Anion gap 11 5 - 15    Comment: Performed at [facility], [place] [address], [place], [place] [address]  Lactic acid, plasma     Status: Abnormal   Collection Time: 08/09/20  1:05 AM  Result Value Ref Range   Lactic Acid, Venous 2.1 (HH) 0.5 - 1.9 mmol/L    Comment: CRITICAL RESULT CALLED TO, READ BACK BY AND VERIFIED WITH: Omer Jack RN [phone] S[address] Radon Performed at [facility], [address], [place], [place] [address]   Protime-INR     Status: Abnormal   Collection Time: 08/09/20  1:05 AM  Result Value Ref Range   Prothrombin Time 23.3 (H)  11.4 - 15.2 seconds   INR 2.2 (H) 0.8 - 1.2    Comment: (NOTE) INR goal varies based on device and disease states. Performed at [facility], [place] [address] [place], [place] [address]   APTT     Status: Abnormal   Collection Time: 08/09/20  1:05 AM  Result Value Ref Range   aPTT 39 (H) 24 - 36 seconds    Comment:        IF BASELINE aPTT IS ELEVATED, SUGGEST PATIENT RISK ASSESSMENT BE USED TO DETERMINE APPROPRIATE ANTICOAGULANT THERAPY. Performed at [facility], [place] [address] [place], [place] [address]   Culture, blood (x 2)     Status: Abnormal (Preliminary result)   Collection Time: 08/09/20  1:47 AM   Specimen: BLOOD  Result Value Ref Range   Specimen Description BLOOD LEFT ANTECUBITAL    Special Requests      BOTTLES DRAWN AEROBIC AND ANAEROBIC Blood Culture adequate volume   Culture  Setup Time      GRAM NEGATIVE RODS AEROBIC BOTTLE ONLY Organism ID to follow CRITICAL RESULT CALLED TO, READ BACK BY AND VERIFIED WITH: PHRMD L SEAY @0041  08/10/20 BY S GEZAHEGN Performed at [facility], [place] [address] [place],  [address]    Culture PSEUDOMONAS AERUGINOSA (A)    Report Status PENDING   Blood Culture ID Panel (Reflexed)     Status: Abnormal   Collection Time: 08/09/20  1:47 AM  Result Value Ref Range   Enterococcus faecalis NOT DETECTED NOT DETECTED   Enterococcus Faecium NOT DETECTED NOT DETECTED   Listeria monocytogenes NOT DETECTED NOT DETECTED   Staphylococcus species NOT DETECTED NOT DETECTED   Staphylococcus aureus (BCID) NOT DETECTED NOT DETECTED   Staphylococcus epidermidis NOT DETECTED NOT DETECTED   Staphylococcus lugdunensis NOT DETECTED NOT DETECTED   Streptococcus species NOT DETECTED NOT DETECTED   Streptococcus agalactiae NOT DETECTED NOT DETECTED   Streptococcus pneumoniae NOT DETECTED NOT DETECTED   Streptococcus pyogenes NOT DETECTED NOT DETECTED   A.calcoaceticus-baumannii NOT DETECTED NOT DETECTED   Bacteroides  fragilis NOT DETECTED NOT DETECTED   Enterobacterales NOT DETECTED NOT DETECTED   Enterobacter cloacae complex NOT DETECTED  NOT DETECTED   Escherichia coli NOT DETECTED NOT DETECTED   Klebsiella aerogenes NOT DETECTED NOT DETECTED   Klebsiella oxytoca NOT DETECTED NOT DETECTED   Klebsiella pneumoniae NOT DETECTED NOT DETECTED   Proteus species NOT DETECTED NOT DETECTED   Salmonella species NOT DETECTED NOT DETECTED   Serratia marcescens NOT DETECTED NOT DETECTED   Haemophilus influenzae NOT DETECTED NOT DETECTED   Neisseria meningitidis NOT DETECTED NOT DETECTED   Pseudomonas aeruginosa DETECTED (A) NOT DETECTED    Comment: CRITICAL RESULT CALLED TO, READ BACK BY AND VERIFIED WITH: PHRMD L SEAY @0041  08/10/20 BY S GEZAHEGN    Stenotrophomonas maltophilia NOT DETECTED NOT DETECTED   Candida albicans NOT DETECTED NOT DETECTED   Candida auris NOT DETECTED NOT DETECTED   Candida glabrata NOT DETECTED NOT DETECTED   Candida krusei NOT DETECTED NOT DETECTED   Candida parapsilosis NOT DETECTED NOT DETECTED   Candida tropicalis NOT DETECTED NOT DETECTED   Cryptococcus neoformans/gattii NOT DETECTED NOT DETECTED   CTX-M ESBL NOT DETECTED NOT DETECTED   Carbapenem resistance IMP NOT DETECTED NOT DETECTED   Carbapenem resistance KPC NOT DETECTED NOT DETECTED   Carbapenem resistance NDM NOT DETECTED NOT DETECTED   Carbapenem resistance VIM NOT DETECTED NOT DETECTED    Comment: Performed at [facility] [address] [place], [place] [address]  Lactic acid, plasma     Status: Abnormal   Collection Time: 08/09/20  5:20 AM  Result Value Ref Range   Lactic Acid, Venous 2.2 (HH) 0.5 - 1.9 mmol/L    Comment: CRITICAL VALUE NOTED.  VALUE IS CONSISTENT WITH PREVIOUSLY REPORTED AND CALLED VALUE. Performed at [facility], [place] [address] [place], [place] [address]   Magnesium     Status: None   Collection Time: 08/09/20  5:20 AM  Result Value Ref Range   Magnesium 1.7 1.7 - 2.4  mg/dL    Comment: Performed at [facility] [address] [place], [place] [address]  Phosphorus     Status: Abnormal   Collection Time: 08/09/20  5:20 AM  Result Value Ref Range   Phosphorus 2.0 (L) 2.5 - 4.6 mg/dL    Comment: Performed at [facility] [address] [place], [place] [address]  Ammonia     Status: None   Collection Time: 08/09/20  6:08 AM  Result Value Ref Range   Ammonia 24 9 - 35 umol/L    Comment: Performed at [facility], [place] [address], [place], [place] [address]  Respiratory Panel by RT PCR (Flu A&B, Covid) - Nasopharyngeal Swab     Status: None   Collection Time: 08/09/20  7:14 AM   Specimen: Nasopharyngeal Swab  Result Value Ref Range   SARS Coronavirus 2 by RT PCR NEGATIVE NEGATIVE    Comment: (NOTE) SARS-CoV-2 target nucleic acids are NOT DETECTED.  The SARS-CoV-2 RNA is generally detectable in upper respiratoy specimens during the acute phase of infection. The lowest concentration of SARS-CoV-2 viral copies this assay can detect is 131 copies/mL. A negative result does not preclude SARS-Cov-2 infection and should not be used as the sole basis for treatment or other patient management decisions. A negative result may occur with  improper specimen collection/handling, submission of specimen other than nasopharyngeal swab, presence of viral mutation(s) within the areas targeted by this assay, and inadequate number of viral copies (<131 copies/mL). A negative result must be combined with clinical observations, patient history, and epidemiological information. The expected  result is Negative.  Fact Sheet for Patients:  PinkCheek.be  Fact Sheet for Healthcare Providers:  GravelBags.it  This test is no t yet approved or cleared by the [place] FDA and  has been authorized for detection and/or diagnosis of SARS-CoV-2 by FDA under an Emergency Use Authorization (EUA).  This EUA will remain  in effect (meaning this test can be used) for the duration of the COVID-19 declaration under Section 564(b)(1) of the Act, 21 U.S.C. section 360bbb-3(b)(1), unless the authorization is terminated or revoked sooner.     Influenza A by PCR NEGATIVE NEGATIVE   Influenza B by PCR NEGATIVE NEGATIVE    Comment: (NOTE) The Xpert Xpress SARS-CoV-2/FLU/RSV assay is intended as an aid in  the diagnosis of influenza from Nasopharyngeal swab specimens and  should not be used as a sole basis for treatment. Nasal washings and  aspirates are unacceptable for Xpert Xpress SARS-CoV-2/FLU/RSV  testing.  Fact Sheet for Patients: PinkCheek.be  Fact Sheet for Healthcare Providers: GravelBags.it  This test is not yet approved or cleared by the [place] FDA and  has been authorized for detection and/or diagnosis of SARS-CoV-2 by  FDA under an Emergency Use Authorization (EUA). This EUA will remain  in effect (meaning this test can be used) for the duration of the  Covid-19 declaration under Section 564(b)(1) of the Act, 21  U.S.C. section 360bbb-3(b)(1), unless the authorization is  terminated or revoked. Performed at [facility], [place] [address], [place], [place] [address]   CBG monitoring, ED     Status: Abnormal   Collection Time: 08/09/20  7:33 AM  Result Value Ref Range   Glucose-Capillary 161 (H) 70 - 99 mg/dL    Comment: Glucose reference range applies only to samples taken after fasting for at least 8 hours.  CBG monitoring, ED     Status: Abnormal   Collection Time: 08/09/20  2:02 PM  Result Value Ref Range   Glucose-Capillary 120 (H) 70 - 99 mg/dL    Comment: Glucose reference range applies only to samples taken after fasting for at least 8 hours.  CBG monitoring, ED     Status: Abnormal   Collection Time: 08/09/20  6:29 PM  Result Value Ref Range   Glucose-Capillary 148 (H) 70 - 99 mg/dL     Comment: Glucose reference range applies only to samples taken after fasting for at least 8 hours.  MRSA PCR Screening     Status: None   Collection Time: 08/09/20  8:23 PM   Specimen: Nasal Mucosa; Nasopharyngeal  Result Value Ref Range   MRSA by PCR NEGATIVE NEGATIVE    Comment:        The GeneXpert MRSA Assay (FDA approved for NASAL specimens only), is one component of a comprehensive MRSA colonization surveillance program. It is not intended to diagnose MRSA infection nor to guide or monitor treatment for MRSA infections. Performed at [facility], [place] [address], [place], [place] [address]   Glucose, capillary     Status: Abnormal   Collection Time: 08/09/20 11:11 PM  Result Value Ref Range   Glucose-Capillary 132 (H) 70 - 99 mg/dL    Comment: Glucose reference range applies only to samples taken after fasting for at least 8 hours.  Heparin level (unfractionated)     Status: Abnormal   Collection Time: 08/10/20 12:45 AM  Result Value Ref Range   Heparin Unfractionated >2.20 (H) 0.30 - 0.70 IU/mL    Comment: RESULTS CONFIRMED BY  MANUAL DILUTION (NOTE) If heparin results are below expected values, and patient dosage has  been confirmed, suggest follow up testing of antithrombin III levels. Performed at [facility], [place] [address], [place], [place] [address]   CBC with Differential     Status: Abnormal   Collection Time: 08/10/20 12:45 AM  Result Value Ref Range   WBC 6.2 4.0 - 10.5 K/uL   RBC 3.06 (L) 4.22 - 5.81 MIL/uL   Hemoglobin 7.8 (L) 13.0 - 17.0 g/dL   HCT 26.0 (L) 39 - 52 %   MCV 85.0 80.0 - 100.0 fL   MCH 25.5 (L) 26.0 - 34.0 pg   MCHC 30.0 30.0 - 36.0 g/dL   RDW 21.2 (H) 11.5 - 15.5 %   Platelets 96 (L) 150 - 400 K/uL    Comment: REPEATED TO VERIFY PLATELET COUNT CONFIRMED BY SMEAR SPECIMEN CHECKED FOR CLOTS    nRBC 0.0 0.0 - 0.2 %   Neutrophils Relative % 93 %   Neutro Abs 5.7 1.7 - 7.7 K/uL   Lymphocytes Relative 4 %   Lymphs Abs 0.2  (L) 0.7 - 4.0 K/uL   Monocytes Relative 3 %   Monocytes Absolute 0.2 0.1 - 1.0 K/uL   Eosinophils Relative 0 %   Eosinophils Absolute 0.0 0.0 - 0.5 K/uL   Basophils Relative 0 %   Basophils Absolute 0.0 0.0 - 0.1 K/uL   Immature Granulocytes 0 %   Abs Immature Granulocytes 0.01 0.00 - 0.07 K/uL   Ovalocytes PRESENT     Comment: Performed at [facility], [place] [address] [place], [place] [address]  Comprehensive metabolic panel     Status: Abnormal   Collection Time: 08/10/20 12:45 AM  Result Value Ref Range   Sodium 137 135 - 145 mmol/L   Potassium 4.9 3.5 - 5.1 mmol/L    Comment: DELTA CHECK NOTED   Chloride 111 98 - 111 mmol/L   CO2 18 (L) 22 - 32 mmol/L   Glucose, Bld 141 (H) 70 - 99 mg/dL    Comment: Glucose reference range applies only to samples taken after fasting for at least 8 hours.   BUN 39 (H) 8 - 23 mg/dL   Creatinine, Ser 1.05 0.61 - 1.24 mg/dL   Calcium 10.0 8.9 - 10.3 mg/dL   Total Protein 5.6 (L) 6.5 - 8.1 g/dL   Albumin 1.9 (L) 3.5 - 5.0 g/dL   AST 213 (H) 15 - 41 U/L   ALT 285 (H) 0 - 44 U/L   Alkaline Phosphatase 418 (H) 38 - 126 U/L   Total Bilirubin 2.6 (H) 0.3 - 1.2 mg/dL   GFR, Estimated >60 >60 mL/min    Comment: (NOTE) Calculated using the CKD-EPI Creatinine Equation (2021)    Anion gap 8 5 - 15    Comment: Performed at [facility], [place] [address], [place], [place] [address]  Protime-INR     Status: Abnormal   Collection Time: 08/10/20 12:45 AM  Result Value Ref Range   Prothrombin Time 23.8 (H) 11.4 - 15.2 seconds   INR 2.2 (H) 0.8 - 1.2    Comment: (NOTE) INR goal varies based on device and disease states. Performed at [facility], [place] [address] [place], [place] [address]   Vancomycin, random     Status: None   Collection Time: 08/10/20 12:45 AM  Result Value Ref Range   Vancomycin Rm 29     Comment:  Random Vancomycin therapeutic range is dependent on dosage and time of specimen collection. A peak range is  20.0-40.0 ug/mL A trough range is 5.0-15.0 ug/mL        Performed at [facility] [address], [place], [place] [address]   APTT     Status: Abnormal   Collection Time: 08/10/20 12:45 AM  Result Value Ref Range   aPTT 154 (H) 24 - 36 seconds    Comment:        IF BASELINE aPTT IS ELEVATED, SUGGEST PATIENT RISK ASSESSMENT BE USED TO DETERMINE APPROPRIATE ANTICOAGULANT THERAPY. Performed at [facility], [place] [address], [place], [place] [address]   Glucose, capillary     Status: None   Collection Time: 08/10/20  8:08 AM  Result Value Ref Range   Glucose-Capillary 89 70 - 99 mg/dL    Comment: Glucose reference range applies only to samples taken after fasting for at least 8 hours.    US Abdomen Limited  Result Date: 08/09/2020 CLINICAL DATA:  Elevated LFTs EXAM: ULTRASOUND ABDOMEN LIMITED RIGHT UPPER QUADRANT COMPARISON:  None. FINDINGS: Gallbladder: Multiple gallbladder calculi. Small amount of pericholecystic fluid. Mild gallbladder wall thickening. Sonographic Percell Miller sign could not be assessed due to patient altered mental status. Common bile duct: Diameter: 7 mm. Liver: Trace perihepatic free fluid. Portal vein is patent on color Doppler imaging with normal direction of blood flow towards the liver. Other: None. IMPRESSION: Cholelithiasis with borderline gallbladder wall thickening and small amount of pericholecystic fluid. Electronically Signed   By: Ulyses Jarred M.D.   On: 08/09/2020 05:09   DG Chest Portable 1 View  Result Date: 08/09/2020 CLINICAL DATA:  Fever, altered mental status EXAM: PORTABLE CHEST 1 VIEW COMPARISON:  03/04/2020 FINDINGS: The patient is mildly rotated on this semi erect portable chest radiograph. Despite this, mild left basilar airspace infiltrate is suspected. The chin overlies the lung apices. No definite pneumothorax or pleural effusion. Cardiac size is mildly enlarged, stable since prior examination. Pulmonary vascularity is normal. No  acute bone abnormality. IMPRESSION: Suspected left basilar pneumonic infiltrate. Electronically Signed   By: Fidela Salisbury MD   On: 08/09/2020 01:54    Review of Systems  Constitutional: Negative for chills, diaphoresis and fever.  HENT: Negative for ear discharge, ear pain, hearing loss and tinnitus.   Eyes: Negative for photophobia and pain.  Respiratory: Negative for cough and shortness of breath.   Cardiovascular: Negative for chest pain.  Gastrointestinal: Negative for abdominal pain, nausea and vomiting.  Genitourinary: Negative for dysuria, flank pain, frequency and urgency.  Musculoskeletal: Negative for back pain, myalgias and neck pain.  Skin: Positive for wound.  Neurological: Negative for dizziness and headaches.  Hematological: Does not bruise/bleed easily.  Psychiatric/Behavioral: The patient is not nervous/anxious.    Blood pressure (!) 149/68, pulse 81, temperature 98.1 F (36.7 C), temperature source Oral, resp. rate 19, height 5\' 11"  (1.803 m), weight 72.6 kg, SpO2 100 %. Physical Exam Constitutional:      General: He is not in acute distress.    Appearance: He is well-developed. He is not diaphoretic.  HENT:     Head: Normocephalic and atraumatic.  Eyes:     General: No scleral icterus.       Right eye: No discharge.        Left eye: No discharge.     Conjunctiva/sclera: Conjunctivae normal.  Cardiovascular:     Rate and Rhythm: Normal rate and regular rhythm.  Pulmonary:  Effort: Pulmonary effort is normal. No respiratory distress.  Musculoskeletal:     Cervical back: Normal range of motion.     Comments: BLE No traumatic wounds, ecchymosis, or rash  Multiple ulcerations both feet  No knee or ankle effusion  Knee stable to varus/ valgus and anterior/posterior stress  Sens DPN, SPN, TN intact  Motor EHL, ext, flex, evers 5/5  DP 0, PT 0, No significant edema  Skin:    General: Skin is warm and dry.  Neurological:     Mental Status: He is alert.   Psychiatric:        Behavior: Behavior normal.     Assessment/Plan: Bilateral foot ulcers -- Agree with ABI's, I suspect this is going to be a vascular issue. Will also check x-rays. Dr. Sharol Given to evaluate later today or in AM. Multiple medical problems including allergic rhinitis, BPH, glaucoma, type 2 diabetes, hyperlipidemia, history of DVT, hypertension, chronic atrial fibrillation, history of embolic CVA, history of hypomagnesemia, kidney transplant recipient, and peripheral vascular disease -- per primary service    Lisette Abu, PA-C Orthopedic Surgery [phone] 08/10/2020, 9:55 AM

## 2020-08-10 NOTE — Progress Notes (Signed)
Initial Nutrition Assessment  DOCUMENTATION CODES:   Not applicable  INTERVENTION:    Ensure Enlive po TID, each supplement provides 350 kcal and 20 grams of protein  MVI daily  NUTRITION DIAGNOSIS:   Increased nutrient needs related to wound healing as evidenced by estimated needs.  GOAL:   Patient will meet greater than or equal to 90% of their needs  MONITOR:   PO intake, Supplement acceptance, Weight trends, Labs, I & O's  REASON FOR ASSESSMENT:   Malnutrition Screening Tool    ASSESSMENT:   Patient with PMH significant for BPH, glaucoma, DM, HLD, DVT, HTN, CVA, kidney transplant recipient, and PVD. Presents this admission with sepsis 2/2 to possible PNA.   Unable to obtain nutrition history from patient. No meal intake documented. RD to provide supplementation to maximize kcal and protein this admission.   Records indicate pt weighed 87.7 kg on 5/1 and show a stated weight of 72.6 kg this admission. Will need to obtain actual weight to assess for weight loss.   Medications: SS novolog, prednisone Labs: LFTs elevated CBG 89-161  Diet Order:   Diet Order            Diet NPO time specified  Diet effective now                 EDUCATION NEEDS:   Not appropriate for education at this time  Skin:  Skin Assessment: Skin Integrity Issues: Skin Integrity Issues:: Other (Comment), Stage II, Unstageable, Diabetic Ulcer Stage II: buttocks Unstageable: R heel, L heel, L foot x2 Diabetic Ulcer: R toes Other: non pressure- R leg  Last BM:  11/15  Height:   Ht Readings from Last 1 Encounters:  08/09/20 5\' 11"  (1.803 m)    Weight:   Wt Readings from Last 1 Encounters:  08/09/20 72.6 kg    BMI:  Body mass index is 22.32 kg/m.  Estimated Nutritional Needs:   Kcal:  2200-2400 kcal  Protein:  115-130 grams  Fluid:  >/= 2 L/day   Mariana Single RD, LDN Clinical Nutrition Pager listed in [place]

## 2020-08-10 NOTE — Progress Notes (Addendum)
ANTICOAGULATION CONSULT NOTE - Follow Up Consult  Pharmacy Consult for heparin Indication: atrial fibrillation  Allergies  Allergen Reactions  . Iodinated Diagnostic Agents Hives  . Tape Hives    Patient Measurements: Height: 5\' 11"  (180.3 cm) Weight: 72.6 kg (160 lb) IBW/kg (Calculated) : 75.3 Heparin Dosing Weight: 72.6kg  Vital Signs: Temp: 98.1 F (36.7 C) (11/15 1600) Temp Source: Oral (11/15 1600) BP: 127/70 (11/15 1400) Pulse Rate: 88 (11/15 1400)  Labs: Recent Labs    08/09/20 0105 08/10/20 0045 08/10/20 1506  HGB 7.6* 7.8*  --   HCT 26.4* 26.0*  --   PLT 164 96*  --   APTT 39* 154* 38*  LABPROT 23.3* 23.8*  --   INR 2.2* 2.2*  --   HEPARINUNFRC  --  >2.20*  --   CREATININE 1.24 1.05  --     Estimated Creatinine Clearance: 67.2 mL/min (by C-G formula based on SCr of 1.05 mg/dL).   Medical History: Past Medical History:  Diagnosis Date  . Allergic rhinitis   . BPH (benign prostatic hyperplasia)   . Chronic atrial fibrillation ([place])   . Diabetes mellitus ([facility])   . DVT (deep venous thrombosis) ([facility])    LLE  . Hematochezia   . HTN (hypertension)   . Hypomagnesemia 03/20/2019  . Kidney transplant recipient   . PVD (peripheral vascular disease) ([facility])     Medications:  Infusions:  . heparin 850 Units/hr (08/10/20 0205)  . piperacillin-tazobactam (ZOSYN)  IV 3.375 g (08/10/20 1423)    Assessment: 33 yom presented to the ED with AMS. He is on chronic apixaban for history of afib but this will be transitioned to IV heparin. Last dose apixaban at 2230 on 08/08/2020.    APTT 38 on heparin 850 units/hr is subtherapeutic on heparin 850 units/hr. Patient has been in nuclear medicine receiving HIDA scan today and RNs have left for today unable to confirm if heparin was paused at all. Spoke to 2W floor RN who stated to his knowledge heparin was not stopped. Per RN no reported bleeding. Hgb 7.8. Plt 96.   Goal of Therapy:  Heparin level 0.3-0.7  units/ml aPTT 66-102 seconds Monitor platelets by anticoagulation protocol: Yes   Plan:  Increase heparin to 900 units/hr  Check aPTT at 0130 Monitor platelet trend Monitor daily heparin level, aPTT, CBC and S/S of bleeding   Cristela Felt, PharmD Clinical Pharmacist  08/10/2020,4:41 PM

## 2020-08-10 NOTE — Progress Notes (Signed)
[facility] for heparin Indication: atrial fibrillation  Assessment: 54 yom presented to the ED with AMS. He is on chronic apixaban for history of afib but this will be transitioned to IV heparin.  APTT this am 154 sec, Hg 7.8, PTLC 96  Goal of Therapy:  Heparin level 0.3-0.7 units/ml aPTT 66-102 seconds Monitor platelets by anticoagulation protocol: Yes   Plan:  Decrease heparin to 850 units/hr Check an 8 hr heparin level/aPTT Daily heparin level, aPTT, CBC F/u PTLC  Ryan Rice 08/10/2020,1:57 AM

## 2020-08-10 NOTE — Progress Notes (Signed)
Pt has pseudomonas bacteremia and his MRSA PCR is neg. Ok to [employer] and cont zosyn per Dr. Ree Kida.  Onnie Boer, PharmD, BCIDP, AAHIVP, CPP Infectious Disease Pharmacist 08/10/2020 9:50 AM

## 2020-08-11 ENCOUNTER — Inpatient Hospital Stay (HOSPITAL_COMMUNITY): Payer: Medicare HMO

## 2020-08-11 DIAGNOSIS — A419 Sepsis, unspecified organism: Secondary | ICD-10-CM | POA: Diagnosis not present

## 2020-08-11 DIAGNOSIS — I96 Gangrene, not elsewhere classified: Secondary | ICD-10-CM

## 2020-08-11 DIAGNOSIS — E1121 Type 2 diabetes mellitus with diabetic nephropathy: Secondary | ICD-10-CM

## 2020-08-11 DIAGNOSIS — I482 Chronic atrial fibrillation, unspecified: Secondary | ICD-10-CM

## 2020-08-11 DIAGNOSIS — E43 Unspecified severe protein-calorie malnutrition: Secondary | ICD-10-CM | POA: Diagnosis not present

## 2020-08-11 DIAGNOSIS — L0390 Cellulitis, unspecified: Secondary | ICD-10-CM

## 2020-08-11 DIAGNOSIS — R4182 Altered mental status, unspecified: Secondary | ICD-10-CM

## 2020-08-11 DIAGNOSIS — Z7901 Long term (current) use of anticoagulants: Secondary | ICD-10-CM

## 2020-08-11 LAB — COMPREHENSIVE METABOLIC PANEL
ALT: 180 U/L — ABNORMAL HIGH (ref 0–44)
AST: 81 U/L — ABNORMAL HIGH (ref 15–41)
Albumin: 1.7 g/dL — ABNORMAL LOW (ref 3.5–5.0)
Alkaline Phosphatase: 317 U/L — ABNORMAL HIGH (ref 38–126)
Anion gap: 8 (ref 5–15)
BUN: 31 mg/dL — ABNORMAL HIGH (ref 8–23)
CO2: 22 mmol/L (ref 22–32)
Calcium: 10 mg/dL (ref 8.9–10.3)
Chloride: 109 mmol/L (ref 98–111)
Creatinine, Ser: 1.11 mg/dL (ref 0.61–1.24)
GFR, Estimated: >60 mL/min (ref >60)
Glucose, Bld: 297 mg/dL — ABNORMAL HIGH (ref 70–99)
Potassium: 3.9 mmol/L (ref 3.5–5.1)
Sodium: 139 mmol/L (ref 135–145)
Total Bilirubin: 1 mg/dL (ref 0.3–1.2)
Total Protein: 5.5 g/dL — ABNORMAL LOW (ref 6.5–8.1)

## 2020-08-11 LAB — PREPARE RBC (CROSSMATCH)

## 2020-08-11 LAB — TACROLIMUS LEVEL: Tacrolimus (FK506) - LabCorp: 1.7 ng/mL — ABNORMAL LOW (ref 2.0–20.0)

## 2020-08-11 LAB — GLUCOSE, CAPILLARY
Glucose-Capillary: 309 mg/dL — ABNORMAL HIGH (ref 70–99)
Glucose-Capillary: 304 mg/dL — ABNORMAL HIGH (ref 70–99)
Glucose-Capillary: 296 mg/dL — ABNORMAL HIGH (ref 70–99)
Glucose-Capillary: 257 mg/dL — ABNORMAL HIGH (ref 70–99)

## 2020-08-11 LAB — CBC
HCT: 23.7 % — ABNORMAL LOW (ref 39.0–52.0)
Hemoglobin: 7.1 g/dL — ABNORMAL LOW (ref 13.0–17.0)
MCH: 25.6 pg — ABNORMAL LOW (ref 26.0–34.0)
MCHC: 30 g/dL (ref 30.0–36.0)
MCV: 85.6 fL (ref 80.0–100.0)
Platelets: 135 10*3/uL — ABNORMAL LOW (ref 150–400)
RBC: 2.77 MIL/uL — ABNORMAL LOW (ref 4.22–5.81)
RDW: 21.2 % — ABNORMAL HIGH (ref 11.5–15.5)
WBC: 3.3 10*3/uL — ABNORMAL LOW (ref 4.0–10.5)
nRBC: 0 % (ref 0.0–0.2)

## 2020-08-11 LAB — HEMOGLOBIN AND HEMATOCRIT, BLOOD
HCT: 23.3 % — ABNORMAL LOW (ref 39.0–52.0)
Hemoglobin: 6.8 g/dL — CL (ref 13.0–17.0)

## 2020-08-11 LAB — APTT
aPTT: 125 seconds — ABNORMAL HIGH (ref 24–36)
aPTT: 115 seconds — ABNORMAL HIGH (ref 24–36)
aPTT: 82 seconds — ABNORMAL HIGH (ref 24–36)

## 2020-08-11 LAB — CULTURE, BLOOD (ROUTINE X 2): Special Requests: ADEQUATE

## 2020-08-11 LAB — HEPARIN LEVEL (UNFRACTIONATED): Heparin Unfractionated: 1.22 IU/mL — ABNORMAL HIGH (ref 0.30–0.70)

## 2020-08-11 MED ORDER — SODIUM CHLORIDE 0.9% IV SOLUTION: Freq: Once | INTRAVENOUS | Status: DC

## 2020-08-11 MED ORDER — SODIUM CHLORIDE 0.9 % IV SOLN
2.0000 g | Freq: Three times a day (TID) | INTRAVENOUS | Status: DC
  Administered 2020-08-11 – 2020-08-15 (×11): 2 g via INTRAVENOUS
  Filled 2020-08-11 (×16): qty 2

## 2020-08-11 MED ORDER — INSULIN GLARGINE 100 UNIT/ML ~~LOC~~ SOLN
8.0000 [IU] | Freq: Every day | SUBCUTANEOUS | Status: DC
  Administered 2020-08-11 – 2020-08-14 (×4): 8 [IU] via SUBCUTANEOUS
  Filled 2020-08-11 (×5): qty 0.08

## 2020-08-11 MED ORDER — SODIUM CHLORIDE 0.9 % IV SOLN
2.0000 g | INTRAVENOUS | Status: AC
  Administered 2020-08-11: 2 g via INTRAVENOUS
  Filled 2020-08-11: qty 2

## 2020-08-11 NOTE — Progress Notes (Signed)
ANTICOAGULATION CONSULT NOTE - Follow Up Consult  Pharmacy Consult for heparin Indication: atrial fibrillation  Labs: Recent Labs    08/09/20 0105 08/09/20 0105 08/10/20 0045 08/10/20 1506 08/11/20 0230  HGB 7.6*   < > 7.8*  --  7.1*  HCT 26.4*  --  26.0*  --  23.7*  PLT 164  --  96*  --  135*  APTT 39*   < > 154* 38* 125*  LABPROT 23.3*  --  23.8*  --   --   INR 2.2*  --  2.2*  --   --   HEPARINUNFRC  --   --  >2.20*  --  1.22*  CREATININE 1.24  --  1.05  --  1.11   < > = values in this interval not displayed.    Assessment: 69yo male supratherapeutic on heparin after rate change, last lab likely inaccurate; no gtt issues or signs of bleeding per RN.  Goal of Therapy:  aPTT 66-102 seconds   Plan:  Will decrease heparin gtt back to 850 units/hr and check PTT in 8 hours.    Rogue Bussing 08/11/2020,3:57 AM

## 2020-08-11 NOTE — Progress Notes (Signed)
HIDA negative for acute cholecystitis and LFTs improving. No indication for emergent/urgent cholecystectomy at this time. Patient may follow up in [place] clinic to discuss elective cholecystectomy if desired.   Norm Parcel, [facility] Surgery 08/11/2020, 6:59 AM Please see Amion for pager number during day hours 7:00am-4:30pm

## 2020-08-11 NOTE — Plan of Care (Signed)
  Problem: Clinical Measurements: ?Goal: Respiratory complications will improve ?Outcome: Progressing ?  ?Problem: Nutrition: ?Goal: Adequate nutrition will be maintained ?Outcome: Progressing ?  ?Problem: Pain Managment: ?Goal: General experience of comfort will improve ?Outcome: Progressing ?  ?Problem: Safety: ?Goal: Ability to remain free from injury will improve ?Outcome: Progressing ?  ?

## 2020-08-11 NOTE — Progress Notes (Signed)
ANTICOAGULATION CONSULT NOTE - Follow Up Consult  Pharmacy Consult for heparin Indication: atrial fibrillation  Allergies  Allergen Reactions  . Iodinated Diagnostic Agents Hives  . Tape Hives    Patient Measurements: Height: 5\' 11"  (180.3 cm) Weight: 72.6 kg (160 lb) IBW/kg (Calculated) : 75.3 Heparin Dosing Weight: 72.6kg  Vital Signs: Temp: 98.2 F (36.8 C) (11/16 1235) Temp Source: Oral (11/16 1235) BP: 136/69 (11/16 1235) Pulse Rate: 63 (11/16 1235)  Assessment: 71 yom presented to the ED with AMS. He is on chronic apixaban for history of afib but this will be transitioned to IV heparin. Last dose apixaban at 2230 on 08/08/2020.    aptt still high at 115  Cbc low but stable  Goal of Therapy:  Heparin level 0.3-0.7 units/ml aPTT 66-102 seconds Monitor platelets by anticoagulation protocol: Yes   Plan:  Reduce heparin to 750 units/hr Check aPTT at 2200 Monitor platelet trend Monitor daily heparin level, aPTT, CBC and S/S of bleeding   Barth Kirks, PharmD, BCPS, BCCCP Clinical Pharmacist [phone]  Please check AMION for all [place] numbers  08/11/2020 12:57 PM

## 2020-08-11 NOTE — Progress Notes (Signed)
ABI's and bilateral lower extremity arterial duplex have been completed. Preliminary results can be found in CV Proc through chart review.   08/11/20 4:17 PM Ryan Rice RVT

## 2020-08-11 NOTE — Progress Notes (Signed)
ANTICOAGULATION CONSULT NOTE - Follow Up Consult  Pharmacy Consult for heparin Indication: atrial fibrillation  Assessment: 46 yom presented to the ED with AMS. He is on chronic apixaban for history of afib but this will be transitioned to IV heparin. Last dose apixaban at 2230 on 08/08/2020.    aptt 82 sec  Cbc low but stable  Goal of Therapy:  Heparin level 0.3-0.7 units/ml aPTT 66-102 seconds Monitor platelets by anticoagulation protocol: Yes   Plan:  Continue heparin at 750 units/hr Monitor platelet trend Monitor daily heparin level, aPTT, CBC and S/S of bleeding   Thanks for allowing pharmacy to be a part of this patient's care.  Excell Seltzer, PharmD Clinical Pharmacist 08/11/2020 10:54 PM

## 2020-08-11 NOTE — Progress Notes (Signed)
PROGRESS NOTE    Ryan Rice  WCB:[id number] DOB: 1951-03-17 DOA: 08/09/2020 PCP: Mina Marble, PA-C   Brief Narrative:  HPI on 08/09/2020 by Dr. Tennis Must Ryan Rice is a 69 y.o. male with medical history significant of allergic rhinitis, BPH, glaucoma, type 2 diabetes, hyperlipidemia, history of DVT, hypertension, chronic atrial fibrillation, history of embolic CVA, history of hypomagnesemia, kidney transplant recipient, peripheral vascular disease who was brought to the emergency department with complaints of altered mental status and fever.  His wife states that earlier in the day he was having abdominal pain, felt constipated and she gave him a stool softeners without significant relief.  Later the patient started feeling cold and shivering.  This was followed by decreased mentation.  His mental status has improved and he is able to answer some questions.  He had abdominal pain earlier in the week and felt nauseous, but this was self-limited.  He has been having RUQ pain for some time now, but does not know how long.  Interim history Patient admitted with AMS. Found to have possible pneumonia and bacteremia.  Patient with elevated LFTs with cholelithiasis however HIDA scan indicating chronic cholecystitis.  Also found to have bacteremia, currently on IV antibiotics.  Pending ABI.  General surgery, Ortho and vascular surgery consulted. Assessment & Plan   Acute metabolic encepalopathy -Currently alert and oriented x 2 (possible at his baseline) -per wife, patient has had memory issues that is intermittent since his previous stroke -Suspect secondary to the infection -Ammonia level 24 -Continue to monitor  Pseudomonas bacteremia/Possible pneumonia -Patient was admitted with sepsis secondary to unknown source however patient did not have sirs criteria only elevated lactic acid level of 2.2.  As of now, sepsis ruled out-however patient is immune compromised -Chest x-ray:  Reviewed appears patient may have a left infiltrate -Initially placed on vancomycin and Zosyn -Vancomycin discontinued due to negative MRSA -Blood cultures 1/2 aerobic bottle showing gram-negative rods, Pseudomonas aeruginosa-sensitivities show resistance to Zosyn -Covid and influenza negative -Started patient on cefepime  Elevated LFTs/cholelithiasis, possible cholecystitis -AST 685, ALT 455, alk phos 517 on admission- LFTs continue to trend downward -Abdominal ultrasound obtained showing cholelithiasis with borderline gallbladder wall thickening and small amount of pericholecystic fluid -HIDA scan negative for acute cholecystitis, indicative of likely chronic cholecystitis -General surgery consulted and appreciated, no indication for emergent or urgent cholecystostomy at this time, patient may follow-up with [facility] in the clinic  Multiple pressure skin ulcers -Present on admission -Wound care consulted -Do not appear to be infected -Patient has been seeing a podiatrist associated with [facility]. -ABI ordered and pending -Orthopedic and vascular surgery consulted and appreciated  Hypokalemia -Resolved with replacement, continue to monitor and replace as needed  Hypophosphatemia -Resolved with replacement, continue to monitor BMP  Kidney transplant -Currently on prednisone -Tacrolimus and mycophenolate currently held  Diabetes mellitus, type II -Placed on carb modified diet -Lantus initially held as patient was n.p.o. -Will restart Lantus today at reduced dose, 8 units -Continue insulin sliding scale CBG monitoring  Glaucoma -Continue eyedrops  Atrial fibrillation, chronic -CHA2DS2-VASc score at least 6 -Metoprolol held as patient is currently bradycardic -Eliquis held in case of any procedure and placed on heparin  Essential hypertension -Medications currently held, continue to monitor BP and treat accordingly  Hyperlipidemia -Statin currently held due to elevated  LFTs  BPH -Continue Flomax, Proscar  Microcytic anemia -chronic  -Baseline hemoglobin approximately 7-8, currently 7.1 -Continue to monitor CBC, if hemoglobin drops below 7, will transfuse  DVT Prophylaxis  Eliquis --> Heparin  Code Status: Full  Family Communication: none at bedside.  Wife via phone  Disposition Plan:  Status is: Inpatient  Remains inpatient appropriate because:Altered mental status, Ongoing diagnostic testing needed not appropriate for outpatient work up, IV treatments appropriate due to intensity of illness or inability to take PO and Inpatient level of care appropriate due to severity of illness   Dispo: The patient is from: Home              Anticipated d/c is to: TBD              Anticipated d/c date is: 3 days              Patient currently is not medically stable to d/c.   Consultants General surgery  Procedures  Abdominal ultrasound  Antibiotics   Anti-infectives (From admission, onward)   Start     Dose/Rate Route Frequency Ordered Stop   08/11/20 1800  ceFEPIme (MAXIPIME) 2 g in sodium chloride 0.9 % 100 mL IVPB        2 g 200 mL/hr over 30 Minutes Intravenous Every 8 hours 08/11/20 0956     08/11/20 1030  ceFEPIme (MAXIPIME) 2 g in sodium chloride 0.9 % 100 mL IVPB        2 g 200 mL/hr over 30 Minutes Intravenous NOW 08/11/20 0956 08/11/20 1117   08/10/20 0600  vancomycin (VANCOCIN) IVPB 1000 mg/200 mL premix  Status:  Discontinued        1,000 mg 200 mL/hr over 60 Minutes Intravenous Every 24 hours 08/09/20 0516 08/09/20 1208   08/09/20 2200  ceFEPIme (MAXIPIME) 2 g in sodium chloride 0.9 % 100 mL IVPB  Status:  Discontinued        2 g 200 mL/hr over 30 Minutes Intravenous Every 12 hours 08/09/20 1209 08/09/20 1357   08/09/20 2200  piperacillin-tazobactam (ZOSYN) IVPB 3.375 g  Status:  Discontinued        3.375 g 12.5 mL/hr over 240 Minutes Intravenous Every 8 hours 08/09/20 1357 08/11/20 0956   08/09/20 1208  vancomycin variable dose  per unstable renal function (pharmacist dosing)  Status:  Discontinued         Does not apply See admin instructions 08/09/20 1208 08/10/20 0949   08/09/20 1000  ceFEPIme (MAXIPIME) 2 g in sodium chloride 0.9 % 100 mL IVPB  Status:  Discontinued        2 g 200 mL/hr over 30 Minutes Intravenous Every 8 hours 08/09/20 0514 08/09/20 1209   08/09/20 0600  Ampicillin-Sulbactam (UNASYN) 3 g in sodium chloride 0.9 % 100 mL IVPB  Status:  Discontinued       Note to Pharmacy: Adjust antibiotics as needed in the absence of metronidazole. TY.   3 g 200 mL/hr over 30 Minutes Intravenous Every 6 hours 08/09/20 0502 08/09/20 1357   08/09/20 0515  ceFEPIme (MAXIPIME) 2 g in sodium chloride 0.9 % 100 mL IVPB  Status:  Discontinued        2 g 200 mL/hr over 30 Minutes Intravenous  Once 08/09/20 0502 08/09/20 0514   08/09/20 0515  vancomycin (VANCOREADY) IVPB 1500 mg/300 mL        1,500 mg 150 mL/hr over 120 Minutes Intravenous  Once 08/09/20 0502 08/09/20 0803   08/09/20 0115  ceFEPIme (MAXIPIME) 2 g in sodium chloride 0.9 % 100 mL IVPB        2 g 200 mL/hr over 30 Minutes Intravenous  Once 08/09/20 0105 08/09/20 0229   08/09/20 0115  metroNIDAZOLE (FLAGYL) IVPB 500 mg  Status:  Discontinued        500 mg 100 mL/hr over 60 Minutes Intravenous Every 8 hours 08/09/20 0105 08/09/20 0511   08/09/20 0115  vancomycin (VANCOCIN) IVPB 1000 mg/200 mL premix  Status:  Discontinued        1,000 mg 200 mL/hr over 60 Minutes Intravenous  Once 08/09/20 0105 08/09/20 0108   08/09/20 0115  vancomycin (VANCOREADY) IVPB 1500 mg/300 mL        1,500 mg 150 mL/hr over 120 Minutes Intravenous  Once 08/09/20 0108 08/09/20 7001      Subjective:   Ryan Rice seen and examined today.  Patient with complaints of feet pain today.  Denies current chest pain or shortness of breath, dizziness or headache.  Denies further abdominal pain at this time, no nausea or vomiting.    Objective:   Vitals:   08/11/20 0013 08/11/20  0404 08/11/20 0800 08/11/20 1235  BP:  (!) 105/41 99/85 136/69  Pulse:   87 63  Resp:   16 17  Temp: 98.4 F (36.9 C) (!) 97.5 F (36.4 C) 98.1 F (36.7 C) 98.2 F (36.8 C)  TempSrc: Oral Oral Oral Oral  SpO2:   100% 100%  Weight:      Height:        Intake/Output Summary (Last 24 hours) at 08/11/2020 1310 Last data filed at 08/11/2020 0240 Gross per 24 hour  Intake 341.01 ml  Output 800 ml  Net -458.99 ml   Filed Weights   08/09/20 0100  Weight: 72.6 kg   Exam  General: Well developed, chronically ill-appearing, NAD  HEENT: NCAT, mucous membranes moist.   Cardiovascular: S1 S2 auscultated, irregular  Respiratory: Diminished breath sounds  Abdomen: Soft, nontender, nondistended, + bowel sounds  Extremities: warm dry without cyanosis clubbing or edema.  Feet with multiple wounds  Neuro: AAOx2 (self, place), nonfocal  Skin: Without rashes exudates or nodules.  Patient with multiple ulcers, dry gangrenous heel ulcer as well as scattered areas of gangrenous changes on right foot/toe  Psych: appropriate mood and affect   Data Reviewed: I have personally reviewed following labs and imaging studies  CBC: Recent Labs  Lab 08/09/20 0105 08/10/20 0045 08/11/20 0230  WBC 9.8 6.2 3.3*  NEUTROABS 8.9* 5.7  --   HGB 7.6* 7.8* 7.1*  HCT 26.4* 26.0* 23.7*  MCV 87.4 85.0 85.6  PLT 164 96* 749*   Basic Metabolic Panel: Recent Labs  Lab 08/09/20 0105 08/09/20 0520 08/10/20 0045 08/11/20 0230  NA 139  --  137 139  K 2.9*  --  4.9 3.9  CL 107  --  111 109  CO2 21*  --  18* 22  GLUCOSE 200*  --  141* 297*  BUN 47*  --  39* 31*  CREATININE 1.24  --  1.05 1.11  CALCIUM 10.3  --  10.0 10.0  MG  --  1.7  --   --   PHOS  --  2.0*  --   --    GFR: Estimated Creatinine Clearance: 63.6 mL/min (by C-G formula based on SCr of 1.11 mg/dL). Liver Function Tests: Recent Labs  Lab 08/09/20 0105 08/10/20 0045 08/11/20 0230  AST 685* 213* 81*  ALT 455* 285* 180*   ALKPHOS 517* 418* 317*  BILITOT 1.6* 2.6* 1.0  PROT 5.5* 5.6* 5.5*  ALBUMIN 1.9* 1.9* 1.7*   No results for input(s): LIPASE,  AMYLASE in the last 168 hours. Recent Labs  Lab 08/09/20 0608  AMMONIA 24   Coagulation Profile: Recent Labs  Lab 08/09/20 0105 08/10/20 0045  INR 2.2* 2.2*   Cardiac Enzymes: No results for input(s): CKTOTAL, CKMB, CKMBINDEX, TROPONINI in the last 168 hours. BNP (last 3 results) No results for input(s): PROBNP in the last 8760 hours. HbA1C: No results for input(s): HGBA1C in the last 72 hours. CBG: Recent Labs  Lab 08/10/20 0808 08/10/20 1603 08/10/20 2130 08/11/20 0800 08/11/20 1236  GLUCAP 89 127* 228* 309* 304*   Lipid Profile: No results for input(s): CHOL, HDL, LDLCALC, TRIG, CHOLHDL, LDLDIRECT in the last 72 hours. Thyroid Function Tests: No results for input(s): TSH, T4TOTAL, FREET4, T3FREE, THYROIDAB in the last 72 hours. Anemia Panel: No results for input(s): VITAMINB12, FOLATE, FERRITIN, TIBC, IRON, RETICCTPCT in the last 72 hours. Urine analysis:    Component Value Date/Time   COLORURINE YELLOW 03/10/2020 1103   APPEARANCEUR CLOUDY (A) 03/10/2020 1103   LABSPEC 1.012 03/10/2020 1103   PHURINE 5.0 03/10/2020 1103   GLUCOSEU 150 (A) 03/10/2020 1103   HGBUR MODERATE (A) 03/10/2020 1103   BILIRUBINUR NEGATIVE 03/10/2020 1103   KETONESUR NEGATIVE 03/10/2020 1103   PROTEINUR >=300 (A) 03/10/2020 1103   NITRITE NEGATIVE 03/10/2020 1103   LEUKOCYTESUR LARGE (A) 03/10/2020 1103   Sepsis Labs: _0 (procalcitonin:4,lacticidven:4)  ) Recent Results (from the past 240 hour(s))  Culture, blood (x 2)     Status: Abnormal   Collection Time: 08/09/20  1:47 AM   Specimen: BLOOD  Result Value Ref Range Status   Specimen Description BLOOD LEFT ANTECUBITAL  Final   Special Requests   Final    BOTTLES DRAWN AEROBIC AND ANAEROBIC Blood Culture adequate volume   Culture  Setup Time   Final    GRAM NEGATIVE RODS AEROBIC BOTTLE  ONLY Organism ID to follow CRITICAL RESULT CALLED TO, READ BACK BY AND VERIFIED WITH: PHRMD L SEAY _1  08/10/20 BY S GEZAHEGN Performed at [facility], [place] [address] [place], [place] [address]    Culture PSEUDOMONAS AERUGINOSA (A)  Final   Report Status 08/11/2020 FINAL  Final   Organism ID, Bacteria PSEUDOMONAS AERUGINOSA  Final      Susceptibility   Pseudomonas aeruginosa - MIC*    CEFTAZIDIME 32 RESISTANT Resistant     CIPROFLOXACIN 1 SENSITIVE Sensitive     GENTAMICIN <=1 SENSITIVE Sensitive     IMIPENEM >=16 RESISTANT Resistant     PIP/TAZO >=128 RESISTANT Resistant     CEFEPIME 8 SENSITIVE Sensitive     * PSEUDOMONAS AERUGINOSA  Blood Culture ID Panel (Reflexed)     Status: Abnormal   Collection Time: 08/09/20  1:47 AM  Result Value Ref Range Status   Enterococcus faecalis NOT DETECTED NOT DETECTED Final   Enterococcus Faecium NOT DETECTED NOT DETECTED Final   Listeria monocytogenes NOT DETECTED NOT DETECTED Final   Staphylococcus species NOT DETECTED NOT DETECTED Final   Staphylococcus aureus (BCID) NOT DETECTED NOT DETECTED Final   Staphylococcus epidermidis NOT DETECTED NOT DETECTED Final   Staphylococcus lugdunensis NOT DETECTED NOT DETECTED Final   Streptococcus species NOT DETECTED NOT DETECTED Final   Streptococcus agalactiae NOT DETECTED NOT DETECTED Final   Streptococcus pneumoniae NOT DETECTED NOT DETECTED Final   Streptococcus pyogenes NOT DETECTED NOT DETECTED Final   A.calcoaceticus-baumannii NOT DETECTED NOT DETECTED Final   Bacteroides fragilis NOT DETECTED NOT DETECTED Final   Enterobacterales NOT DETECTED NOT DETECTED Final   Enterobacter cloacae complex  NOT DETECTED NOT DETECTED Final   Escherichia coli NOT DETECTED NOT DETECTED Final   Klebsiella aerogenes NOT DETECTED NOT DETECTED Final   Klebsiella oxytoca NOT DETECTED NOT DETECTED Final   Klebsiella pneumoniae NOT DETECTED NOT DETECTED Final   Proteus species NOT DETECTED NOT DETECTED  Final   Salmonella species NOT DETECTED NOT DETECTED Final   Serratia marcescens NOT DETECTED NOT DETECTED Final   Haemophilus influenzae NOT DETECTED NOT DETECTED Final   Neisseria meningitidis NOT DETECTED NOT DETECTED Final   Pseudomonas aeruginosa DETECTED (A) NOT DETECTED Final    Comment: CRITICAL RESULT CALLED TO, READ BACK BY AND VERIFIED WITH: PHRMD L SEAY _0  08/10/20 BY S GEZAHEGN    Stenotrophomonas maltophilia NOT DETECTED NOT DETECTED Final   Candida albicans NOT DETECTED NOT DETECTED Final   Candida auris NOT DETECTED NOT DETECTED Final   Candida glabrata NOT DETECTED NOT DETECTED Final   Candida krusei NOT DETECTED NOT DETECTED Final   Candida parapsilosis NOT DETECTED NOT DETECTED Final   Candida tropicalis NOT DETECTED NOT DETECTED Final   Cryptococcus neoformans/gattii NOT DETECTED NOT DETECTED Final   CTX-M ESBL NOT DETECTED NOT DETECTED Final   Carbapenem resistance IMP NOT DETECTED NOT DETECTED Final   Carbapenem resistance KPC NOT DETECTED NOT DETECTED Final   Carbapenem resistance NDM NOT DETECTED NOT DETECTED Final   Carbapenem resistance VIM NOT DETECTED NOT DETECTED Final    Comment: Performed at [facility], [place] [address] [place], [place] [address]  Culture, blood (x 2)     Status: None (Preliminary result)   Collection Time: 08/09/20  2:21 AM   Specimen: BLOOD RIGHT HAND  Result Value Ref Range Status   Specimen Description BLOOD RIGHT HAND  Final   Special Requests   Final    BOTTLES DRAWN AEROBIC AND ANAEROBIC Blood Culture adequate volume   Culture   Final    NO GROWTH 1 DAY Performed at [facility], [place] [address] [place], [place] [address]    Report Status PENDING  Incomplete  Respiratory Panel by RT PCR (Flu A&B, Covid) - Nasopharyngeal Swab     Status: None   Collection Time: 08/09/20  7:14 AM   Specimen: Nasopharyngeal Swab  Result Value Ref Range Status   SARS Coronavirus 2 by RT PCR NEGATIVE NEGATIVE Final    Comment:  (NOTE) SARS-CoV-2 target nucleic acids are NOT DETECTED.  The SARS-CoV-2 RNA is generally detectable in upper respiratoy specimens during the acute phase of infection. The lowest concentration of SARS-CoV-2 viral copies this assay can detect is 131 copies/mL. A negative result does not preclude SARS-Cov-2 infection and should not be used as the sole basis for treatment or other patient management decisions. A negative result may occur with  improper specimen collection/handling, submission of specimen other than nasopharyngeal swab, presence of viral mutation(s) within the areas targeted by this assay, and inadequate number of viral copies (<131 copies/mL). A negative result must be combined with clinical observations, patient history, and epidemiological information. The expected result is Negative.  Fact Sheet for Patients:  PinkCheek.be  Fact Sheet for Healthcare Providers:  GravelBags.it  This test is no t yet approved or cleared by the [place] FDA and  has been authorized for detection and/or diagnosis of SARS-CoV-2 by FDA under an Emergency Use Authorization (EUA). This EUA will remain  in effect (meaning this test can be used) for the duration of the COVID-19 declaration under Section 564(b)(1) of the Act, 21 U.S.C.  section 360bbb-3(b)(1), unless the authorization is terminated or revoked sooner.     Influenza A by PCR NEGATIVE NEGATIVE Final   Influenza B by PCR NEGATIVE NEGATIVE Final    Comment: (NOTE) The Xpert Xpress SARS-CoV-2/FLU/RSV assay is intended as an aid in  the diagnosis of influenza from Nasopharyngeal swab specimens and  should not be used as a sole basis for treatment. Nasal washings and  aspirates are unacceptable for Xpert Xpress SARS-CoV-2/FLU/RSV  testing.  Fact Sheet for Patients: PinkCheek.be  Fact Sheet for Healthcare  Providers: GravelBags.it  This test is not yet approved or cleared by the [place] FDA and  has been authorized for detection and/or diagnosis of SARS-CoV-2 by  FDA under an Emergency Use Authorization (EUA). This EUA will remain  in effect (meaning this test can be used) for the duration of the  Covid-19 declaration under Section 564(b)(1) of the Act, 21  U.S.C. section 360bbb-3(b)(1), unless the authorization is  terminated or revoked. Performed at [facility], [place] [address] [place], [place] [address]   MRSA PCR Screening     Status: None   Collection Time: 08/09/20  8:23 PM   Specimen: Nasal Mucosa; Nasopharyngeal  Result Value Ref Range Status   MRSA by PCR NEGATIVE NEGATIVE Final    Comment:        The GeneXpert MRSA Assay (FDA approved for NASAL specimens only), is one component of a comprehensive MRSA colonization surveillance program. It is not intended to diagnose MRSA infection nor to guide or monitor treatment for MRSA infections. Performed at [facility], [place] [address], [place], [place] [address]       Radiology Studies: NM Hepato W/EF  Result Date: 08/10/2020 CLINICAL DATA:  Abdominal pain. Constipation. Gallbladder stones on prior CT ultrasound 08/09/2020 also demonstrated gallstones EXAM: NUCLEAR MEDICINE HEPATOBILIARY IMAGING WITH GALLBLADDER EF TECHNIQUE: Sequential images of the abdomen were obtained out to 60 minutes following intravenous administration of radiopharmaceutical. After oral ingestion of Ensure, gallbladder ejection fraction was determined. At 60 min, normal ejection fraction is greater than 33%. RADIOPHARMACEUTICALS:  4.2 mCi Tc-39m Choletec IV COMPARISON:  Ultrasound 08/09/2020 FINDINGS: There is prompt uptake of radiotracer within liver. Counts are slow to travel into the common bile duct. The gallbladder begins to fill at approximately 50 minutes. The gallbladder fills in completely by 60  minutes. Upon administration of fatty meal, the gallbladder does contract and counts are evident within the small bowel. Calculated gallbladder ejection fraction is near 100%%. (Normal gallbladder ejection fraction with Ensure is greater than 33%.) IMPRESSION: 1. Patent cystic duct and common bile duct. 2. Normal gallbladder ejection fraction. 3. The gallbladder has some delayed filling and does not appear to fill completely which could indicate chronic cholecystitis. Electronically Signed   By: SSuzy BouchardM.D.   On: 08/10/2020 16:53   DG Foot Complete Left  Result Date: 08/10/2020 CLINICAL DATA:  69year old male with using foot ulcers, bilateral heel ulcers right greater than left. EXAM: LEFT FOOT - COMPLETE 3+ VIEW COMPARISON:  Left ankle series 03/05/2019. FINDINGS: Extensive calcified peripheral vascular disease. No discrete soft tissue wound is evident. No soft tissue gas. Calcaneus appears intact. Other tarsal bones appear intact. Chronic posttraumatic or degenerative spurring at the tibial plateau. Metatarsals and phalanges appear intact. No acute osseous abnormality identified. IMPRESSION: 1. No acute osseous abnormality. No radiographic evidence of osteomyelitis. 2. Extensive calcified peripheral vascular disease. Electronically Signed   By: HGenevie AnnM.D.   On: 08/10/2020  10:52   DG Foot Complete Right  Result Date: 08/10/2020 CLINICAL DATA:  69 year old male with using foot ulcers, bilateral heel ulcers right greater than left. EXAM: RIGHT FOOT COMPLETE - 3+ VIEW COMPARISON:  Right foot radiographs 06/23/2020. FINDINGS: Severe calcified peripheral vascular disease. Soft tissue swelling and irregularity overlying the dorsal calcaneus, but underlying bony cortex appears stable since September. No calcaneus fracture or dislocation. Other tarsal bones appear stable. There is a new healed or healing mildly distracted fracture at the base of the 5th metatarsal. Unclear if this was subtle on the  September radiographs. No cortical osteolysis. Insufficiency fracture favored. Otherwise the metatarsals appear intact. Phalanges appear stable. No soft tissue gas. Soft tissue swelling also appears increased in the distal right foot since September. IMPRESSION: 1. No plain radiographic evidence of osteomyelitis. Increased soft tissue swelling since September. No soft tissue gas. 2. Healed or healing insufficiency fracture suspected at the base of the 5th metatarsal. 3. Severe calcified peripheral vascular disease. Electronically Signed   By: Genevie Ann M.D.   On: 08/10/2020 10:54     Scheduled Meds: . brimonidine  1 drop Right Eye BID  . collagenase   Topical Daily  . dorzolamide-timolol  1 drop Right Eye BID  . feeding supplement  237 mL Oral TID BM  . insulin aspart  0-9 Units Subcutaneous TID WC  . multivitamin with minerals  1 tablet Oral Daily  . predniSONE  7.5 mg Oral Daily  . sodium bicarbonate  1,300 mg Oral BID  . tamsulosin  0.4 mg Oral [id number]   Continuous Infusions: . ceFEPime (MAXIPIME) IV    . heparin 750 Units/hr (08/11/20 1300)     LOS: 2 days   Time Spent in minutes   45 minutes  Serai Tukes D.O. on 08/11/2020 at 1:10 PM  Between 7am to 7pm - Please see pager noted on [email or web address]  After 7pm go to [email or web address]  And look for the night coverage person covering for me after hours  [facility] Office  [phone]

## 2020-08-11 NOTE — Consult Note (Signed)
Referring Physician: Dr. Sharol Given  Patient name: Ryan Rice MRN: [id number] DOB: [birth year] Sex: male  REASON FOR CONSULT: Gangrenous heel wounds bilaterally  HPI: Ryan Rice is a 69 y.o. male, admitted with altered mental status.  Patient states he has not been ambulatory for quite some time.  He states his mother takes care of him as at home.  He was only alert and oriented to person on my exam.  He does not really complain of pain in his feet.  Other medical problems include atrial fibrillation, diabetes, hypertension all of which have been stable.  He is not really able to provide much history on what happened to his feet.  His history states he has had a prior kidney transplant.  He was on Eliquis at home for atrial fibrillation.  He is currently on heparin.  Past Medical History:  Diagnosis Date  . Allergic rhinitis   . BPH (benign prostatic hyperplasia)   . Chronic atrial fibrillation ([place])   . Diabetes mellitus ([place])   . DVT (deep venous thrombosis) ([facility])    LLE  . Hematochezia   . HTN (hypertension)   . Hypomagnesemia 03/20/2019  . Kidney transplant recipient   . PVD (peripheral vascular disease) ([place])    Past Surgical History:  Procedure Laterality Date  . KIDNEY TRANSPLANT      Family History  Problem Relation Age of Onset  . Stroke Mother   . Heart disease Father   . Diabetes Father   . Diabetes Sister   . Diabetes Brother   . Heart disease Brother     SOCIAL HISTORY: Social History   Socioeconomic History  . Marital status: Married    Spouse name: Not on file  . Number of children: Not on file  . Years of education: Not on file  . Highest education level: Not on file  Occupational History  . Not on file  Tobacco Use  . Smoking status: Former Smoker    Types: Cigarettes    Quit date: 03/07/1984    Years since quitting: 36.4  . Smokeless tobacco: Never Used  . Tobacco comment: N/A  Vaping Use  . Vaping Use: Never used  Substance and Sexual  Activity  . Alcohol use: Not Currently    Comment: rare use  . Drug use: Never  . Sexual activity: Yes  Other Topics Concern  . Not on file  Social History Narrative  . Not on file   Social Determinants of Health   Financial Resource Strain:   . Difficulty of Paying Living Expenses: Not on file  Food Insecurity:   . Worried About Charity fundraiser in the Last Year: Not on file  . Ran Out of Food in the Last Year: Not on file  Transportation Needs:   . Lack of Transportation (Medical): Not on file  . Lack of Transportation (Non-Medical): Not on file  Physical Activity:   . Days of Exercise per Week: Not on file  . Minutes of Exercise per Session: Not on file  Stress:   . Feeling of Stress : Not on file  Social Connections:   . Frequency of Communication with Friends and Family: Not on file  . Frequency of Social Gatherings with Friends and Family: Not on file  . Attends Religious Services: Not on file  . Active Member of Clubs or Organizations: Not on file  . Attends Archivist Meetings: Not on file  . Marital Status: Not on file  Intimate  Partner Violence:   . Fear of Current or Ex-Partner: Not on file  . Emotionally Abused: Not on file  . Physically Abused: Not on file  . Sexually Abused: Not on file    Allergies  Allergen Reactions  . Iodinated Diagnostic Agents Hives  . Tape Hives    Current Facility-Administered Medications  Medication Dose Route Frequency Provider Last Rate Last Admin  . acetaminophen (TYLENOL) tablet 650 mg  650 mg Oral Q6H PRN Reubin Milan, MD      . brimonidine [facility]) 0.2 % ophthalmic solution 1 drop  1 drop Right Eye BID Reubin Milan, MD   1 drop at 08/11/20 0816  . ceFEPIme (MAXIPIME) 2 g in sodium chloride 0.9 % 100 mL IVPB  2 g Intravenous Q8H Pham, Minh Q, RPH-CPP      . collagenase (SANTYL) ointment   Topical Daily Cristal Ford, DO   Given at 08/11/20 0815  . dorzolamide-timolol (COSOPT) 22.3-6.8 MG/ML  ophthalmic solution 1 drop  1 drop Right Eye BID Reubin Milan, MD   1 drop at 08/11/20 0816  . feeding supplement (ENSURE ENLIVE / ENSURE PLUS) liquid 237 mL  237 mL Oral TID BM Mikhail, Velta Addison, DO   237 mL at 08/11/20 0814  . heparin ADULT infusion 100 units/mL (25000 units/277mL sodium chloride 0.45%)  850 Units/hr Intravenous Continuous Wynona Neat P, RPH 8.5 mL/hr at 08/11/20 0400 850 Units/hr at 08/11/20 0400  . insulin aspart (novoLOG) injection 0-9 Units  0-9 Units Subcutaneous TID WC Reubin Milan, MD   7 Units at 08/11/20 [phone]  . multivitamin with minerals tablet 1 tablet  1 tablet Oral Daily Cristal Ford, DO   1 tablet at 08/11/20 4034  . ondansetron (ZOFRAN) tablet 4 mg  4 mg Oral Q6H PRN Reubin Milan, MD       Or  . ondansetron [facility]) injection 4 mg  4 mg Intravenous Q6H PRN Reubin Milan, MD      . predniSONE (DELTASONE) tablet 7.5 mg  7.5 mg Oral Daily Reubin Milan, MD   7.5 mg at 08/11/20 7425  . sodium bicarbonate tablet 1,300 mg  1,300 mg Oral BID Reubin Milan, MD   1,300 mg at 08/11/20 9563  . tamsulosin (FLOMAX) capsule 0.4 mg  0.4 mg Oral Kristine Linea, MD   0.4 mg at 08/11/20 8756  . Zinc Oxide (TRIPLE PASTE) 12.8 % ointment   Topical PRN Mikhail, Velta Addison, DO        ROS:   General:  No weight loss, Fever, chills  Cardiac: No recent episodes of chest pain/pressure, no shortness of breath at rest.  No shortness of breath with exertion.  Denies history of atrial fibrillation or irregular heartbeat   Physical Examination  Vitals:   08/10/20 2009 08/11/20 0013 08/11/20 0404 08/11/20 0800  BP: (!) 114/58  (!) 105/41 99/85  Pulse:    87  Resp:    16  Temp: 98.3 F (36.8 C) 98.4 F (36.9 C) (!) 97.5 F (36.4 C) 98.1 F (36.7 C)  TempSrc:  Oral Oral Oral  SpO2:    100%  Weight:      Height:        Body mass index is 22.32 kg/m.  General:  Alert and oriented to name only, no acute distress HEENT:  Normal Neck: No JVD Cardiac: Regular Rate and Rhythm Abdomen: Soft, non-tender, non-distended, no mass Skin: No rash, dry gangrenous heel ulcer of left foot approximately  5 x 5 cm 3 x 3 cm right foot scattered areas of dry gangrenous changes on toes Extremity Pulses:  2+ femoral, absent popliteal dorsalis pedis, posterior tibial pulses bilaterally Musculoskeletal: No deformity or edema  Neurologic: Upper and lower extremity motor 5/5 and symmetric  DATA:  CBC    Component Value Date/Time   WBC 3.3 (L) 08/11/2020 0230   RBC 2.77 (L) 08/11/2020 0230   HGB 7.1 (L) 08/11/2020 0230   HGB 7.8 (L) 02/28/2019 0331   HCT 23.7 (L) 08/11/2020 0230   PLT 135 (L) 08/11/2020 0230   MCV 85.6 08/11/2020 0230   MCH 25.6 (L) 08/11/2020 0230   MCHC 30.0 08/11/2020 0230   RDW 21.2 (H) 08/11/2020 0230   LYMPHSABS 0.2 (L) 08/10/2020 0045   MONOABS 0.2 08/10/2020 0045   EOSABS 0.0 08/10/2020 0045   BASOSABS 0.0 08/10/2020 0045   BMET    Component Value Date/Time   NA 139 08/11/2020 0230   NA 137 05/04/2020 0000   K 3.9 08/11/2020 0230   CL 109 08/11/2020 0230   CO2 22 08/11/2020 0230   GLUCOSE 297 (H) 08/11/2020 0230   BUN 31 (H) 08/11/2020 0230   BUN 29 (A) 05/04/2020 0000   CREATININE 1.11 08/11/2020 0230   CALCIUM 10.0 08/11/2020 0230   GFRNONAA >60 08/11/2020 0230   GFRAA 74.45 05/04/2020 0000   Albumin 1.9 on admission currently 1.7 now  LFTs AST elevated at 685, ALT 455, alkaline phosphatase 517, bilirubin 1.6 on admission improved to 81, 180, 317, 1.7 currently  INR was 2.2 yesterday.  ASSESSMENT: Patient with bilateral gangrenous changes in feet.  He is nonambulatory.  Current mental status is very limited with orientation only to self.  He does have evidence of peripheral arterial disease bilaterally.  However, in his current state he is not a very good revascularization candidate.  We will follow over the next few days.  If his nutritional status improved somewhat as well as his  mental status and improvement of his liver function tests.  He has evidence of severe protein calorie malnutrition as well as hepatic synthetic dysfunction.  With an INR of 2.2 he is currently not a candidate for any intervention.   PLAN: We will follow up on ABIs which have already been ordered.  If patient makes dramatic improvement over the next few days could consider arteriogram later in the week.   Ruta Hinds, MD [facility] Office: [phone]

## 2020-08-11 NOTE — Progress Notes (Signed)
Inpatient Diabetes Program Recommendations  AACE/ADA: New Consensus Statement on Inpatient Glycemic Control (2015)  Target Ranges:  Prepandial:   less than 140 mg/dL      Peak postprandial:   less than 180 mg/dL (1-2 hours)      Critically ill patients:  140 - 180 mg/dL   Lab Results  Component Value Date   GLUCAP 309 (H) 08/11/2020   HGBA1C 10.9 (H) 03/05/2020    Review of Glycemic Control Results for Ryan Rice, Ryan Rice (MRN [id number]) as of 08/11/2020 12:11  Ref. Range 08/09/2020 23:11 08/10/2020 08:08 08/10/2020 16:03 08/10/2020 21:30 08/11/2020 08:00  Glucose-Capillary Latest Ref Range: 70 - 99 mg/dL 132 (H) 89 127 (H) 228 (H) 309 (H)   Diabetes history: DM2 Outpatient Diabetes medications: Lantus 8 if CBG >100 + Januvia (states not taking) Current orders for Inpatient glycemic control: Novolog sensitive correction tid   Inpatient Diabetes Program Recommendations:   Patient was discharged on 03/18/20 on Lantus 35 units daily On admission 08/09/20 patient listed on Lantus 25 units  Please consider: -Lantus 8 units qd (0.1unit/kg x 72.6 kg = 8 units) Secure chat sent to Dr. Ree Kida. Will follow during hospitalization.  Thank you, Nani Gasser. Wilma Michaelson, RN, MSN, CDE  Diabetes Coordinator Inpatient Glycemic Control Team Team Pager [phone] (8am-5pm) 08/11/2020 12:18 PM

## 2020-08-11 NOTE — Consult Note (Signed)
ORTHOPAEDIC CONSULTATION  REQUESTING PHYSICIAN: Cristal Ford, DO  Chief Complaint: Painful dry gangrene both feet left worse than right  HPI: Ryan Rice is a 69 y.o. male who presents with dry gangrenous ulcers of both feet left worse than right.  Patient states he has seen vascular surgery outside of [facility] in the past but he is unsure of the treatment or plan.  Past Medical History:  Diagnosis Date  . Allergic rhinitis   . BPH (benign prostatic hyperplasia)   . Chronic atrial fibrillation ([place])   . Diabetes mellitus ([place])   . DVT (deep venous thrombosis) ([facility])    LLE  . Hematochezia   . HTN (hypertension)   . Hypomagnesemia 03/20/2019  . Kidney transplant recipient   . PVD (peripheral vascular disease) ([place])    Past Surgical History:  Procedure Laterality Date  . KIDNEY TRANSPLANT     Social History   Socioeconomic History  . Marital status: Married    Spouse name: Not on file  . Number of children: Not on file  . Years of education: Not on file  . Highest education level: Not on file  Occupational History  . Not on file  Tobacco Use  . Smoking status: Former Smoker    Types: Cigarettes    Quit date: 03/07/1984    Years since quitting: 36.4  . Smokeless tobacco: Never Used  . Tobacco comment: N/A  Vaping Use  . Vaping Use: Never used  Substance and Sexual Activity  . Alcohol use: Not Currently    Comment: rare use  . Drug use: Never  . Sexual activity: Yes  Other Topics Concern  . Not on file  Social History Narrative  . Not on file   Social Determinants of Health   Financial Resource Strain:   . Difficulty of Paying Living Expenses: Not on file  Food Insecurity:   . Worried About Charity fundraiser in the Last Year: Not on file  . Ran Out of Food in the Last Year: Not on file  Transportation Needs:   . Lack of Transportation (Medical): Not on file  . Lack of Transportation (Non-Medical): Not on file  Physical Activity:   . Days of  Exercise per Week: Not on file  . Minutes of Exercise per Session: Not on file  Stress:   . Feeling of Stress : Not on file  Social Connections:   . Frequency of Communication with Friends and Family: Not on file  . Frequency of Social Gatherings with Friends and Family: Not on file  . Attends Religious Services: Not on file  . Active Member of Clubs or Organizations: Not on file  . Attends Archivist Meetings: Not on file  . Marital Status: Not on file   Family History  Problem Relation Age of Onset  . Stroke Mother   . Heart disease Father   . Diabetes Father   . Diabetes Sister   . Diabetes Brother   . Heart disease Brother    - negative except otherwise stated in the family history section Allergies  Allergen Reactions  . Iodinated Diagnostic Agents Hives  . Tape Hives   Prior to Admission medications   Medication Sig Start Date End Date Taking? Authorizing Provider  acetaminophen (TYLENOL) 325 MG tablet Take 650 mg by mouth every 6 (six) hours as needed.   Yes [provider]  apixaban (ELIQUIS) 5 MG TABS tablet Take 1 tablet (5 mg total) by mouth 2 (two)  times daily. 05/05/20  Yes Medina-Vargas, Monina C, NP  artificial tears (LACRILUBE) OINT ophthalmic ointment Place into the left eye at bedtime as needed for dry eyes. 05/05/20  Yes Medina-Vargas, Monina C, NP  atorvastatin (LIPITOR) 80 MG tablet Take 1 tablet (80 mg total) by mouth daily at 6 PM. 05/05/20  Yes Medina-Vargas, Monina C, NP  brimonidine (ALPHAGAN) 0.2 % ophthalmic solution Place 1 drop into the right eye 2 (two) times daily. 05/05/20  Yes Medina-Vargas, Monina C, NP  cholecalciferol (VITAMIN D) 25 MCG (1000 UNIT) tablet Take 2 tablets (2,000 Units total) by mouth daily. 05/05/20  Yes Medina-Vargas, Monina C, NP  cinacalcet (SENSIPAR) 60 MG tablet Take 1 tablet (60 mg total) by mouth every morning. With Breakfast. Patient taking differently: Take 90 mg by mouth every morning. With Breakfast.   05/05/20  Yes Medina-Vargas, Monina C, NP  diclofenac Sodium (VOLTAREN) 1 % GEL Apply 2 g topically daily as needed (pain).   Yes [provider]  dorzolamide-timolol (COSOPT) 22.3-6.8 MG/ML ophthalmic solution Place 1 drop into the right eye 2 (two) times daily. 05/05/20  Yes Medina-Vargas, Monina C, NP  famotidine (PEPCID) 10 MG tablet Take 1 tablet (10 mg total) by mouth daily. 05/05/20  Yes Medina-Vargas, Monina C, NP  finasteride (PROSCAR) 5 MG tablet Take 1 tablet (5 mg total) by mouth daily. 05/05/20  Yes Medina-Vargas, Monina C, NP  folic acid (FOLVITE) 1 MG tablet Take 1 tablet (1 mg total) by mouth daily. 05/05/20  Yes Medina-Vargas, Monina C, NP  insulin glargine (LANTUS SOLOSTAR) 100 UNIT/ML Solostar Pen Inject 25 Units into the skin at bedtime. Patient taking differently: Inject 8 Units into the skin See admin instructions. Inject 8 units subq if bg is >100 in the morning 05/05/20  Yes Medina-Vargas, Monina C, NP  magnesium oxide (MAG-OX) 400 MG tablet Take 1 tablet (400 mg total) by mouth 2 (two) times daily. 05/05/20  Yes Medina-Vargas, Monina C, NP  metoprolol tartrate (LOPRESSOR) 25 MG tablet Take 0.5 tablets (12.5 mg total) by mouth 2 (two) times daily. Patient taking differently: Take 12.5 mg by mouth See admin instructions. Take 1/2 tablet in the morning and at bedtime 05/05/20  Yes Medina-Vargas, Monina C, NP  mirtazapine (REMERON) 15 MG tablet Take 1 tablet (15 mg total) by mouth at bedtime. 05/05/20  Yes Medina-Vargas, Monina C, NP  Multiple Vitamins-Minerals (THERA M PLUS PO) Take 1 tablet by mouth daily.   Yes [provider]  mycophenolate (MYFORTIC) 180 MG EC tablet Take 2 tablets (360 mg total) by mouth 2 (two) times daily. 05/05/20  Yes Medina-Vargas, Monina C, NP  NIFEdipine (ADALAT CC) 60 MG 24 hr tablet Take 1 tablet (60 mg total) by mouth daily. 05/05/20  Yes Medina-Vargas, Monina C, NP  predniSONE (DELTASONE) 2.5 MG tablet Take 3 tablets (7.5 mg total) by mouth  daily. Take 3 tablets to = 7.5 mg Patient taking differently: Take 5 mg by mouth daily. Take 2 tablets to = 5 mg 05/05/20  Yes Medina-Vargas, Monina C, NP  sodium bicarbonate 650 MG tablet Take 2 tablets (1,300 mg total) by mouth 2 (two) times daily. 05/05/20  Yes Medina-Vargas, Monina C, NP  tacrolimus (PROGRAF) 0.5 MG capsule Take 3 capsules (1.5 mg total) by mouth 2 (two) times daily. Patient taking differently: Take 1 mg by mouth 2 (two) times daily.  05/05/20  Yes Medina-Vargas, Monina C, NP  tamsulosin (FLOMAX) 0.4 MG CAPS capsule Take 1 capsule (0.4 mg total) by mouth every morning.  05/05/20  Yes Medina-Vargas, Monina C, NP  Glucerna (GLUCERNA) LIQD Take 237 mLs by mouth daily.    [provider]  hydrALAZINE (APRESOLINE) 25 MG tablet Take 1 tablet (25 mg total) by mouth in the morning, at noon, in the evening, and at bedtime. Patient not taking: Reported on 08/09/2020 05/05/20   Medina-Vargas, Monina C, NP  Insulin Pen Needle 32G X 6 MM MISC 1 application by Does not apply route daily. 05/05/20   Medina-Vargas, Monina C, NP  sitaGLIPtin (JANUVIA) 25 MG tablet Take 1 tablet (25 mg total) by mouth daily. Patient not taking: Reported on 08/09/2020 05/05/20   Medina-Vargas, Monina C, NP  sulfamethoxazole-trimethoprim (BACTRIM DS) 800-160 MG tablet Take 1 tablet by mouth 3 (three) times a week. Monday, Wednesday, and Friday. Patient not taking: Reported on 08/09/2020 05/06/20   Medina-Vargas, Senaida Lange, NP   NM Hepato W/EF  Result Date: 08/10/2020 CLINICAL DATA:  Abdominal pain. Constipation. Gallbladder stones on prior CT ultrasound 08/09/2020 also demonstrated gallstones EXAM: NUCLEAR MEDICINE HEPATOBILIARY IMAGING WITH GALLBLADDER EF TECHNIQUE: Sequential images of the abdomen were obtained out to 60 minutes following intravenous administration of radiopharmaceutical. After oral ingestion of Ensure, gallbladder ejection fraction was determined. At 60 min, normal ejection fraction is greater  than 33%. RADIOPHARMACEUTICALS:  4.2 mCi Tc-21m  Choletec IV COMPARISON:  Ultrasound 08/09/2020 FINDINGS: There is prompt uptake of radiotracer within liver. Counts are slow to travel into the common bile duct. The gallbladder begins to fill at approximately 50 minutes. The gallbladder fills in completely by 60 minutes. Upon administration of fatty meal, the gallbladder does contract and counts are evident within the small bowel. Calculated gallbladder ejection fraction is near 100%%. (Normal gallbladder ejection fraction with Ensure is greater than 33%.) IMPRESSION: 1. Patent cystic duct and common bile duct. 2. Normal gallbladder ejection fraction. 3. The gallbladder has some delayed filling and does not appear to fill completely which could indicate chronic cholecystitis. Electronically Signed   By: Suzy Bouchard M.D.   On: 08/10/2020 16:53   DG Foot Complete Left  Result Date: 08/10/2020 CLINICAL DATA:  69 year old male with using foot ulcers, bilateral heel ulcers right greater than left. EXAM: LEFT FOOT - COMPLETE 3+ VIEW COMPARISON:  Left ankle series 03/05/2019. FINDINGS: Extensive calcified peripheral vascular disease. No discrete soft tissue wound is evident. No soft tissue gas. Calcaneus appears intact. Other tarsal bones appear intact. Chronic posttraumatic or degenerative spurring at the tibial plateau. Metatarsals and phalanges appear intact. No acute osseous abnormality identified. IMPRESSION: 1. No acute osseous abnormality. No radiographic evidence of osteomyelitis. 2. Extensive calcified peripheral vascular disease. Electronically Signed   By: Genevie Ann M.D.   On: 08/10/2020 10:52   DG Foot Complete Right  Result Date: 08/10/2020 CLINICAL DATA:  69 year old male with using foot ulcers, bilateral heel ulcers right greater than left. EXAM: RIGHT FOOT COMPLETE - 3+ VIEW COMPARISON:  Right foot radiographs 06/23/2020. FINDINGS: Severe calcified peripheral vascular disease. Soft tissue  swelling and irregularity overlying the dorsal calcaneus, but underlying bony cortex appears stable since September. No calcaneus fracture or dislocation. Other tarsal bones appear stable. There is a new healed or healing mildly distracted fracture at the base of the 5th metatarsal. Unclear if this was subtle on the September radiographs. No cortical osteolysis. Insufficiency fracture favored. Otherwise the metatarsals appear intact. Phalanges appear stable. No soft tissue gas. Soft tissue swelling also appears increased in the distal right foot since September. IMPRESSION: 1. No plain radiographic evidence of osteomyelitis. Increased  soft tissue swelling since September. No soft tissue gas. 2. Healed or healing insufficiency fracture suspected at the base of the 5th metatarsal. 3. Severe calcified peripheral vascular disease. Electronically Signed   By: Genevie Ann M.D.   On: 08/10/2020 10:54   - pertinent xrays, CT, MRI studies were reviewed and independently interpreted  Positive ROS: All other systems have been reviewed and were otherwise negative with the exception of those mentioned in the HPI and as above.  Physical Exam: General: Alert, no acute distress Psychiatric: Patient is competent for consent with normal mood and affect Lymphatic: No axillary or cervical lymphadenopathy Cardiovascular: No pedal edema Respiratory: No cyanosis, no use of accessory musculature GI: No organomegaly, abdomen is soft and non-tender    Images:  @ENCIMAGES @  Labs:  Lab Results  Component Value Date   HGBA1C 10.9 (H) 03/05/2020   HGBA1C 7.1 (H) 12/30/2019   HGBA1C 6.2 (H) 03/04/2019   ESRSEDRATE 2 02/28/2019   CRP 11.7 (H) 02/28/2019   LABURIC 7.8 03/06/2019   REPTSTATUS PENDING 08/09/2020   CULT  08/09/2020    NO GROWTH 1 DAY Performed at [facility], [place] [address], [place], [place] [address]    LABORGA PSEUDOMONAS AERUGINOSA 08/09/2020    Lab Results  Component Value Date   ALBUMIN  1.7 (L) 08/11/2020   ALBUMIN 1.9 (L) 08/10/2020   ALBUMIN 1.9 (L) 08/09/2020   LABURIC 7.8 03/06/2019    Neurologic: Patient does not have protective sensation bilateral lower extremities.   MUSCULOSKELETAL:   Skin: Examination patient has a black gangrenous ulcer 3 cm in diameter on the left heel a superficial ischemic ulcer on the right heel he has black gangrenous ulcers over both great toes as well as ischemic changes to the lesser toes bilaterally.  Patient does not have a palpable pulse bilaterally his feet are thin and atrophic.  Patient has an albumin of 1.7 with a hemoglobin A1c of 10.9.  Assessment: Assessment gangrenous changes of both feet left worse than right with uncontrolled diabetes and severe protein caloric malnutrition.  Plan: Patient will require vascular evaluation for foot salvage intervention.  With patient's severe protein caloric malnutrition and uncontrolled diabetes foot salvage intervention will be difficult.  Thank you for the consult and the opportunity to see Ryan Rice, [place] [phone] 8:37 AM

## 2020-08-12 DIAGNOSIS — R4182 Altered mental status, unspecified: Secondary | ICD-10-CM

## 2020-08-12 LAB — GLUCOSE, CAPILLARY
Glucose-Capillary: 170 mg/dL — ABNORMAL HIGH (ref 70–99)
Glucose-Capillary: 163 mg/dL — ABNORMAL HIGH (ref 70–99)
Glucose-Capillary: 125 mg/dL — ABNORMAL HIGH (ref 70–99)
Glucose-Capillary: 97 mg/dL (ref 70–99)

## 2020-08-12 LAB — COMPREHENSIVE METABOLIC PANEL
ALT: 125 U/L — ABNORMAL HIGH (ref 0–44)
AST: 39 U/L (ref 15–41)
Albumin: 1.8 g/dL — ABNORMAL LOW (ref 3.5–5.0)
Alkaline Phosphatase: 276 U/L — ABNORMAL HIGH (ref 38–126)
Anion gap: 7 (ref 5–15)
BUN: 25 mg/dL — ABNORMAL HIGH (ref 8–23)
CO2: 19 mmol/L — ABNORMAL LOW (ref 22–32)
Calcium: 10.3 mg/dL (ref 8.9–10.3)
Chloride: 112 mmol/L — ABNORMAL HIGH (ref 98–111)
Creatinine, Ser: 0.95 mg/dL (ref 0.61–1.24)
GFR, Estimated: >60 mL/min (ref >60)
Glucose, Bld: 235 mg/dL — ABNORMAL HIGH (ref 70–99)
Potassium: 4.3 mmol/L (ref 3.5–5.1)
Sodium: 138 mmol/L (ref 135–145)
Total Bilirubin: 1 mg/dL (ref 0.3–1.2)
Total Protein: 5.5 g/dL — ABNORMAL LOW (ref 6.5–8.1)

## 2020-08-12 LAB — FOLATE: Folate: 18.5 ng/mL (ref >5.9)

## 2020-08-12 LAB — IRON AND TIBC
Iron: 104 ug/dL (ref 45–182)
Saturation Ratios: 92 % — ABNORMAL HIGH (ref 17.9–39.5)
TIBC: 113 ug/dL — ABNORMAL LOW (ref 250–450)
UIBC: 9 ug/dL

## 2020-08-12 LAB — URINALYSIS, ROUTINE W REFLEX MICROSCOPIC
Bilirubin Urine: NEGATIVE
Glucose, UA: >=500 mg/dL — AB
Ketones, ur: NEGATIVE mg/dL
Leukocytes,Ua: NEGATIVE
Nitrite: NEGATIVE
Protein, ur: 30 mg/dL — AB
RBC / HPF: >50 RBC/hpf — ABNORMAL HIGH (ref 0–5)
Specific Gravity, Urine: 1.016 (ref 1.005–1.030)
pH: 8 (ref 5.0–8.0)

## 2020-08-12 LAB — CBC
HCT: 27.1 % — ABNORMAL LOW (ref 39.0–52.0)
Hemoglobin: 7.9 g/dL — ABNORMAL LOW (ref 13.0–17.0)
MCH: 25.2 pg — ABNORMAL LOW (ref 26.0–34.0)
MCHC: 29.2 g/dL — ABNORMAL LOW (ref 30.0–36.0)
MCV: 86.6 fL (ref 80.0–100.0)
Platelets: 134 10*3/uL — ABNORMAL LOW (ref 150–400)
RBC: 3.13 MIL/uL — ABNORMAL LOW (ref 4.22–5.81)
RDW: 20 % — ABNORMAL HIGH (ref 11.5–15.5)
WBC: 3.7 10*3/uL — ABNORMAL LOW (ref 4.0–10.5)
nRBC: 0 % (ref 0.0–0.2)

## 2020-08-12 LAB — OCCULT BLOOD X 1 CARD TO LAB, STOOL: Fecal Occult Bld: NEGATIVE

## 2020-08-12 LAB — HEPARIN LEVEL (UNFRACTIONATED): Heparin Unfractionated: 0.44 IU/mL (ref 0.30–0.70)

## 2020-08-12 LAB — PROTIME-INR
INR: 1.2 (ref 0.8–1.2)
Prothrombin Time: 14.8 seconds (ref 11.4–15.2)

## 2020-08-12 LAB — FERRITIN: Ferritin: 1407 ng/mL — ABNORMAL HIGH (ref 24–336)

## 2020-08-12 LAB — RETICULOCYTES
Immature Retic Fract: 9.4 % (ref 2.3–15.9)
RBC.: 3.12 MIL/uL — ABNORMAL LOW (ref 4.22–5.81)
Retic Count, Absolute: 65.2 10*3/uL (ref 19.0–186.0)
Retic Ct Pct: 2.1 % (ref 0.4–3.1)

## 2020-08-12 LAB — APTT: aPTT: 68 seconds — ABNORMAL HIGH (ref 24–36)

## 2020-08-12 LAB — PREPARE RBC (CROSSMATCH)

## 2020-08-12 LAB — VITAMIN B12: Vitamin B-12: 885 pg/mL (ref 180–914)

## 2020-08-12 MED ORDER — SILVER SULFADIAZINE 1 % EX CREA
TOPICAL_CREAM | Freq: Every day | CUTANEOUS | Status: DC
  Filled 2020-08-12: qty 85

## 2020-08-12 MED ORDER — MYCOPHENOLATE SODIUM 180 MG PO TBEC
360.0000 mg | DELAYED_RELEASE_TABLET | Freq: Two times a day (BID) | ORAL | Status: DC
  Administered 2020-08-12 – 2020-08-15 (×7): 360 mg via ORAL
  Filled 2020-08-12 (×8): qty 2

## 2020-08-12 MED ORDER — TACROLIMUS 1 MG PO CAPS
1.0000 mg | ORAL_CAPSULE | Freq: Two times a day (BID) | ORAL | Status: DC
  Administered 2020-08-12 – 2020-08-15 (×7): 1 mg via ORAL
  Filled 2020-08-12 (×7): qty 1

## 2020-08-12 MED ORDER — SODIUM CHLORIDE 0.9% IV SOLUTION: Freq: Once | INTRAVENOUS | Status: AC

## 2020-08-12 MED ORDER — PREDNISONE 5 MG PO TABS
5.0000 mg | ORAL_TABLET | Freq: Every day | ORAL | Status: DC
  Administered 2020-08-13 – 2020-08-15 (×3): 5 mg via ORAL
  Filled 2020-08-12 (×3): qty 1

## 2020-08-12 NOTE — Progress Notes (Signed)
ANTICOAGULATION CONSULT NOTE - Follow Up Consult  Pharmacy Consult for heparin Indication: atrial fibrillation  Allergies  Allergen Reactions  . Iodinated Diagnostic Agents Hives  . Tape Hives    Patient Measurements: Height: 5\' 11"  (180.3 cm) Weight: 72.6 kg (160 lb) IBW/kg (Calculated) : 75.3 Heparin Dosing Weight: 72.6kg  Vital Signs: Temp: 97.6 F (36.4 C) (11/17 0255) Temp Source: Oral (11/17 0255) BP: 157/87 (11/17 0835) Pulse Rate: 44 (11/17 0835)  Assessment: 87 yom presented to the ED with AMS. He is on chronic apixaban for history of afib but this will be transitioned to IV heparin. Last dose apixaban at 2230 on 08/08/2020.    aptt and hep lvl now correlating and therapeutic   Cbc low but stable  Goal of Therapy:  Heparin level 0.3-0.7 units/ml aPTT 66-102 seconds Monitor platelets by anticoagulation protocol: Yes   Plan:  Continue heparin 750 units/hr Daily hep lvl cbc   Barth Kirks, PharmD, BCPS, BCCCP Clinical Pharmacist [phone]  Please check AMION for all [place] numbers  08/12/2020 9:07 AM

## 2020-08-12 NOTE — Progress Notes (Signed)
I spoke with the patient's wife by phone this morning.  He is completely nonambulatory.  He is total assist to remove from the bed and put into his wheelchair.  He is also unable to even operate his own wheelchair and has to have someone pushed him.  He does not use either leg for transfer.  In light of this new information I do not believe he is a candidate for revascularization.  Would defer ongoing care to Dr. Sharol Given.  I discussed with the patient's wife today continued local wound care imminently in need of an amputation as he is not septic or does not seem to be experiencing significant pain from these wounds.  I did discuss with her that at some point in the future he may require amputation of one or both legs.  Hopefully the wounds will stabilize and not progress.  Certainly need to emphasize to her and his caregivers when he is discharged from the hospital to keep all pressure off the areas of his feet and to emphasize nutritional support.  We will sign off at this point.  Arteriogram for Friday is cancelled.  Ruta Hinds, MD [facility] Office: [phone]

## 2020-08-12 NOTE — Plan of Care (Signed)
  Problem: Clinical Measurements: Goal: Respiratory complications will improve Outcome: Progressing   Problem: Nutrition: Goal: Adequate nutrition will be maintained Outcome: Progressing   Problem: Pain Managment: Goal: General experience of comfort will improve Outcome: Progressing   Problem: Safety: Goal: Ability to remain free from injury will improve Outcome: Progressing   Problem: Safety: Goal: Ability to remain free from injury will improve Outcome: Progressing   Problem: Clinical Measurements: Goal: Cardiovascular complication will be avoided Outcome: Not Progressing  Pt continues to have AFIB,

## 2020-08-12 NOTE — Progress Notes (Signed)
Vascular studies reviewed by Dr. Sharol Given.  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  Bunny boots remain in place will order Silvadene dressing changes to ulcers daily.

## 2020-08-12 NOTE — Progress Notes (Signed)
PROGRESS NOTE    Ryan Rice  QPY:[id number] DOB: May 16, 1951 DOA: 08/09/2020 PCP: Mina Marble, PA-C   Brief Narrative:  HPI on 08/09/2020 by Dr. Tennis Must Ryan Rice is a 69 y.o. male with medical history significant of allergic rhinitis, BPH, glaucoma, type 2 diabetes, hyperlipidemia, history of DVT, hypertension, chronic atrial fibrillation, history of embolic CVA, history of hypomagnesemia, kidney transplant recipient, peripheral vascular disease who was brought to the emergency department with complaints of altered mental status and fever.  His wife states that earlier in the day he was having abdominal pain, felt constipated and she gave him a stool softeners without significant relief.  Later the patient started feeling cold and shivering.  This was followed by decreased mentation.  His mental status has improved and he is able to answer some questions.  He had abdominal pain earlier in the week and felt nauseous, but this was self-limited.  He has been having RUQ pain for some time now, but does not know how long.  Interim history Patient admitted with AMS. Found to have possible pneumonia and bacteremia.  Patient with elevated LFTs with cholelithiasis however HIDA scan indicating chronic cholecystitis.  Also found to have bacteremia, currently on IV antibiotics.  Pending ABI.  General surgery, Ortho and vascular surgery consulted.  Subjective:   [name] seen and examined today.  He denies any chest pain, shortness of breath, fever or chills, reports his appetite has improved, but once discussed with staff does appear he only ate 30% of his breakfast .     Assessment & Plan   Acute metabolic encepalopathy -Currently alert and oriented x 2 (possible at his baseline) -per wife, patient has had memory issues that is intermittent since his previous stroke -Suspect secondary to the infection -Ammonia level 24 -Continue to monitor  Pseudomonas bacteremia/Possible  pneumonia -Patient was admitted with sepsis secondary to unknown source however patient did not have sirs criteria only elevated lactic acid level of 2.2.  As of now, sepsis ruled out-however patient is immune compromised -Chest x-ray: Reviewed appears patient may have a left infiltrate -Initially placed on vancomycin and Zosyn -Vancomycin discontinued due to negative MRSA -Blood cultures 1/2 aerobic bottle showing gram-negative rods, Pseudomonas aeruginosa-sensitivities show resistance to Zosyn -Covid and influenza negative -Started patient on cefepime  Elevated LFTs/cholelithiasis, possible cholecystitis -AST 685, ALT 455, alk phos 517 on admission- LFTs continue to trend downward -Abdominal ultrasound obtained showing cholelithiasis with borderline gallbladder wall thickening and small amount of pericholecystic fluid -HIDA scan negative for acute cholecystitis, indicative of likely chronic cholecystitis -General surgery consulted and appreciated, no indication for emergent or urgent cholecystostomy at this time, patient may follow-up with [facility] in the clinic  Multiple pressure skin ulcers/severe PVD -Present on admission -Wound care consulted -Do not appear to be infected -Patient has been seeing a podiatrist associated with [facility]. -Vascular surgery orthopedic consult greatly appreciated, patient is very poor candidate for revascularization option, current plan to continue with local wound care, at one point likely he will need 1 or bilateral amputation once wound ulcers or he develop OM/infected wound .  Hypokalemia -Resolved with replacement, continue to monitor and replace as needed  Hypophosphatemia -Resolved with replacement, continue to monitor BMP  Kidney transplant -Currently on prednisone -Tacrolimus and mycophenolate will be resumed   Diabetes mellitus, type II -Placed on carb modified diet -Lantus initially held as patient was n.p.o. -Will restart Lantus today at reduced  dose, 8 units -Continue insulin sliding scale CBG monitoring  Glaucoma -Continue eyedrops  Atrial fibrillation, chronic -CHA2DS2-VASc score at least 6 -Metoprolol held as patient is currently bradycardic -Eliquis held in case of any procedure and placed on heparin  Essential hypertension -Medications currently held, continue to monitor BP and treat accordingly  Hyperlipidemia -Statin currently held due to elevated LFTs  BPH -Continue Flomax, Proscar  Microcytic anemia/anemia of chronic illness -chronic  -Baseline hemoglobin approximately 7-8, currently 7.1, he is Hemoccult negative, he is transfused 1 unit overnight with good response, will give another unit today.   DVT Prophylaxis  Eliquis --> Heparin  Code Status: Full  Family Communication: none at bedside.    Disposition Plan:  Status is: Inpatient  Remains inpatient appropriate because:Altered mental status, Ongoing diagnostic testing needed not appropriate for outpatient work up, IV treatments appropriate due to intensity of illness or inability to take PO and Inpatient level of care appropriate due to severity of illness   Dispo: The patient is from: Home              Anticipated d/c is to: TBD              Anticipated d/c date is: 3 days              Patient currently is not medically stable to d/c.   Consultants General surgery  Procedures  Abdominal ultrasound  Antibiotics   Anti-infectives (From admission, onward)   Start     Dose/Rate Route Frequency Ordered Stop   08/11/20 1800  ceFEPIme (MAXIPIME) 2 g in sodium chloride 0.9 % 100 mL IVPB        2 g 200 mL/hr over 30 Minutes Intravenous Every 8 hours 08/11/20 0956     08/11/20 1030  ceFEPIme (MAXIPIME) 2 g in sodium chloride 0.9 % 100 mL IVPB        2 g 200 mL/hr over 30 Minutes Intravenous NOW 08/11/20 0956 08/11/20 1117   08/10/20 0600  vancomycin (VANCOCIN) IVPB 1000 mg/200 mL premix  Status:  Discontinued        1,000 mg 200 mL/hr over 60  Minutes Intravenous Every 24 hours 08/09/20 0516 08/09/20 1208   08/09/20 2200  ceFEPIme (MAXIPIME) 2 g in sodium chloride 0.9 % 100 mL IVPB  Status:  Discontinued        2 g 200 mL/hr over 30 Minutes Intravenous Every 12 hours 08/09/20 1209 08/09/20 1357   08/09/20 2200  piperacillin-tazobactam (ZOSYN) IVPB 3.375 g  Status:  Discontinued        3.375 g 12.5 mL/hr over 240 Minutes Intravenous Every 8 hours 08/09/20 1357 08/11/20 0956   08/09/20 1208  vancomycin variable dose per unstable renal function (pharmacist dosing)  Status:  Discontinued         Does not apply See admin instructions 08/09/20 1208 08/10/20 0949   08/09/20 1000  ceFEPIme (MAXIPIME) 2 g in sodium chloride 0.9 % 100 mL IVPB  Status:  Discontinued        2 g 200 mL/hr over 30 Minutes Intravenous Every 8 hours 08/09/20 0514 08/09/20 1209   08/09/20 0600  Ampicillin-Sulbactam (UNASYN) 3 g in sodium chloride 0.9 % 100 mL IVPB  Status:  Discontinued       Note to Pharmacy: Adjust antibiotics as needed in the absence of metronidazole. TY.   3 g 200 mL/hr over 30 Minutes Intravenous Every 6 hours 08/09/20 0502 08/09/20 1357   08/09/20 0515  ceFEPIme (MAXIPIME) 2 g in sodium chloride 0.9 % 100 mL IVPB  Status:  Discontinued        2 g 200 mL/hr over 30 Minutes Intravenous  Once 08/09/20 0502 08/09/20 0514   08/09/20 0515  vancomycin (VANCOREADY) IVPB 1500 mg/300 mL        1,500 mg 150 mL/hr over 120 Minutes Intravenous  Once 08/09/20 0502 08/09/20 0803   08/09/20 0115  ceFEPIme (MAXIPIME) 2 g in sodium chloride 0.9 % 100 mL IVPB        2 g 200 mL/hr over 30 Minutes Intravenous  Once 08/09/20 0105 08/09/20 0229   08/09/20 0115  metroNIDAZOLE (FLAGYL) IVPB 500 mg  Status:  Discontinued        500 mg 100 mL/hr over 60 Minutes Intravenous Every 8 hours 08/09/20 0105 08/09/20 0511   08/09/20 0115  vancomycin (VANCOCIN) IVPB 1000 mg/200 mL premix  Status:  Discontinued        1,000 mg 200 mL/hr over 60 Minutes Intravenous  Once  08/09/20 0105 08/09/20 0108   08/09/20 0115  vancomycin (VANCOREADY) IVPB 1500 mg/300 mL        1,500 mg 150 mL/hr over 120 Minutes Intravenous  Once 08/09/20 0108 08/09/20 0552       Objective:   Vitals:   08/12/20 0016 08/12/20 0255 08/12/20 0835 08/12/20 1216  BP: (!) 149/84 (!) 164/70 (!) 157/87 (!) 165/78  Pulse: 61 (!) 43 (!) 44 (!) 38  Resp: _0 Temp:  97.6 F (36.4 C)  97.8 F (36.6 C)  TempSrc:  Oral  Oral  SpO2: 99% 100% 100% 100%  Weight:      Height:        Intake/Output Summary (Last 24 hours) at 08/12/2020 1409 Last data filed at 08/12/2020 0430 Gross per 24 hour  Intake 310 ml  Output 725 ml  Net -415 ml   Filed Weights   08/09/20 0100  Weight: 72.6 kg   Exam  Awake Alert, Oriented X 2, extremely frail, deconditioned and chronically ill-appearing Symmetrical Chest wall movement, diminished air entry at the bases RRR,No Gallops,Rubs or new Murmurs, No Parasternal Heave +ve B.Sounds, Abd Soft, No tenderness, No rebound - guarding or rigidity. Extremities with protective boots, digits with some ulcers and dry gangrene  Data Reviewed: I have personally reviewed following labs and imaging studies  CBC: Recent Labs  Lab 08/09/20 0105 08/10/20 0045 08/11/20 0230 08/11/20 1644 08/12/20 0558  WBC 9.8 6.2 3.3*  --  3.7*  NEUTROABS 8.9* 5.7  --   --   --   HGB 7.6* 7.8* 7.1* 6.8* 7.9*  HCT 26.4* 26.0* 23.7* 23.3* 27.1*  MCV 87.4 85.0 85.6  --  86.6  PLT 164 96* 135*  --  315*   Basic Metabolic Panel: Recent Labs  Lab 08/09/20 0105 08/09/20 0520 08/10/20 0045 08/11/20 0230 08/12/20 0558  NA 139  --  137 139 138  K 2.9*  --  4.9 3.9 4.3  CL 107  --  111 109 112*  CO2 21*  --  18* 22 19*  GLUCOSE 200*  --  141* 297* 235*  BUN 47*  --  39* 31* 25*  CREATININE 1.24  --  1.05 1.11 0.95  CALCIUM 10.3  --  10.0 10.0 10.3  MG  --  1.7  --   --   --   PHOS  --  2.0*  --   --   --    GFR: Estimated Creatinine Clearance: 74.3 mL/min (by  C-G formula based on SCr  of 0.95 mg/dL). Liver Function Tests: Recent Labs  Lab 08/09/20 0105 08/10/20 0045 08/11/20 0230 08/12/20 0558  AST 685* 213* 81* 39  ALT 455* 285* 180* 125*  ALKPHOS 517* 418* 317* 276*  BILITOT 1.6* 2.6* 1.0 1.0  PROT 5.5* 5.6* 5.5* 5.5*  ALBUMIN 1.9* 1.9* 1.7* 1.8*   No results for input(s): LIPASE, AMYLASE in the last 168 hours. Recent Labs  Lab 08/09/20 0608  AMMONIA 24   Coagulation Profile: Recent Labs  Lab 08/09/20 0105 08/10/20 0045 08/12/20 0558  INR 2.2* 2.2* 1.2   Cardiac Enzymes: No results for input(s): CKTOTAL, CKMB, CKMBINDEX, TROPONINI in the last 168 hours. BNP (last 3 results) No results for input(s): PROBNP in the last 8760 hours. HbA1C: No results for input(s): HGBA1C in the last 72 hours. CBG: Recent Labs  Lab 08/11/20 1236 08/11/20 1717 08/11/20 2230 08/12/20 0833 08/12/20 1214  GLUCAP 304* 296* 257* 170* 163*   Lipid Profile: No results for input(s): CHOL, HDL, LDLCALC, TRIG, CHOLHDL, LDLDIRECT in the last 72 hours. Thyroid Function Tests: No results for input(s): TSH, T4TOTAL, FREET4, T3FREE, THYROIDAB in the last 72 hours. Anemia Panel: Recent Labs    08/12/20 0558  VITAMINB12 885  FOLATE 18.5  FERRITIN 1,407*  TIBC 113*  IRON 104  RETICCTPCT 2.1   Urine analysis:    Component Value Date/Time   COLORURINE YELLOW 08/11/2020 [place] 08/11/2020 0439   LABSPEC 1.016 08/11/2020 0439   PHURINE 8.0 08/11/2020 0439   GLUCOSEU >=500 (A) 08/11/2020 0439   HGBUR MODERATE (A) 08/11/2020 0439   BILIRUBINUR NEGATIVE 08/11/2020 [place] 08/11/2020 0439   PROTEINUR 30 (A) 08/11/2020 0439   NITRITE NEGATIVE 08/11/2020 0439   LEUKOCYTESUR NEGATIVE 08/11/2020 0439   Sepsis Labs: _0 (procalcitonin:4,lacticidven:4)  ) Recent Results (from the past 240 hour(s))  Culture, blood (x 2)     Status: Abnormal   Collection Time: 08/09/20  1:47 AM   Specimen: BLOOD  Result  Value Ref Range Status   Specimen Description BLOOD LEFT ANTECUBITAL  Final   Special Requests   Final    BOTTLES DRAWN AEROBIC AND ANAEROBIC Blood Culture adequate volume   Culture  Setup Time   Final    GRAM NEGATIVE RODS AEROBIC BOTTLE ONLY Organism ID to follow CRITICAL RESULT CALLED TO, READ BACK BY AND VERIFIED WITH: PHRMD L SEAY _1  08/10/20 BY S GEZAHEGN Performed at [facility], [place] [address], [place], [place] [address]    Culture PSEUDOMONAS AERUGINOSA (A)  Final   Report Status 08/11/2020 FINAL  Final   Organism ID, Bacteria PSEUDOMONAS AERUGINOSA  Final      Susceptibility   Pseudomonas aeruginosa - MIC*    CEFTAZIDIME 32 RESISTANT Resistant     CIPROFLOXACIN 1 SENSITIVE Sensitive     GENTAMICIN <=1 SENSITIVE Sensitive     IMIPENEM >=16 RESISTANT Resistant     PIP/TAZO >=128 RESISTANT Resistant     CEFEPIME 8 SENSITIVE Sensitive     * PSEUDOMONAS AERUGINOSA  Blood Culture ID Panel (Reflexed)     Status: Abnormal   Collection Time: 08/09/20  1:47 AM  Result Value Ref Range Status   Enterococcus faecalis NOT DETECTED NOT DETECTED Final   Enterococcus Faecium NOT DETECTED NOT DETECTED Final   Listeria monocytogenes NOT DETECTED NOT DETECTED Final   Staphylococcus species NOT DETECTED NOT DETECTED Final   Staphylococcus aureus (BCID) NOT DETECTED NOT DETECTED Final   Staphylococcus epidermidis NOT DETECTED NOT DETECTED Final  Staphylococcus lugdunensis NOT DETECTED NOT DETECTED Final   Streptococcus species NOT DETECTED NOT DETECTED Final   Streptococcus agalactiae NOT DETECTED NOT DETECTED Final   Streptococcus pneumoniae NOT DETECTED NOT DETECTED Final   Streptococcus pyogenes NOT DETECTED NOT DETECTED Final   A.calcoaceticus-baumannii NOT DETECTED NOT DETECTED Final   Bacteroides fragilis NOT DETECTED NOT DETECTED Final   Enterobacterales NOT DETECTED NOT DETECTED Final   Enterobacter cloacae complex NOT DETECTED NOT DETECTED Final   Escherichia coli NOT  DETECTED NOT DETECTED Final   Klebsiella aerogenes NOT DETECTED NOT DETECTED Final   Klebsiella oxytoca NOT DETECTED NOT DETECTED Final   Klebsiella pneumoniae NOT DETECTED NOT DETECTED Final   Proteus species NOT DETECTED NOT DETECTED Final   Salmonella species NOT DETECTED NOT DETECTED Final   Serratia marcescens NOT DETECTED NOT DETECTED Final   Haemophilus influenzae NOT DETECTED NOT DETECTED Final   Neisseria meningitidis NOT DETECTED NOT DETECTED Final   Pseudomonas aeruginosa DETECTED (A) NOT DETECTED Final    Comment: CRITICAL RESULT CALLED TO, READ BACK BY AND VERIFIED WITH: PHRMD L SEAY _0  08/10/20 BY S GEZAHEGN    Stenotrophomonas maltophilia NOT DETECTED NOT DETECTED Final   Candida albicans NOT DETECTED NOT DETECTED Final   Candida auris NOT DETECTED NOT DETECTED Final   Candida glabrata NOT DETECTED NOT DETECTED Final   Candida krusei NOT DETECTED NOT DETECTED Final   Candida parapsilosis NOT DETECTED NOT DETECTED Final   Candida tropicalis NOT DETECTED NOT DETECTED Final   Cryptococcus neoformans/gattii NOT DETECTED NOT DETECTED Final   CTX-M ESBL NOT DETECTED NOT DETECTED Final   Carbapenem resistance IMP NOT DETECTED NOT DETECTED Final   Carbapenem resistance KPC NOT DETECTED NOT DETECTED Final   Carbapenem resistance NDM NOT DETECTED NOT DETECTED Final   Carbapenem resistance VIM NOT DETECTED NOT DETECTED Final    Comment: Performed at [facility], [address] [place], [place] [address]  Culture, blood (x 2)     Status: None (Preliminary result)   Collection Time: 08/09/20  2:21 AM   Specimen: BLOOD RIGHT HAND  Result Value Ref Range Status   Specimen Description BLOOD RIGHT HAND  Final   Special Requests   Final    BOTTLES DRAWN AEROBIC AND ANAEROBIC Blood Culture adequate volume   Culture   Final    NO GROWTH 3 DAYS Performed at [facility], [place] [address], [place], [place] [address]    Report Status PENDING  Incomplete  Respiratory  Panel by RT PCR (Flu A&B, Covid) - Nasopharyngeal Swab     Status: None   Collection Time: 08/09/20  7:14 AM   Specimen: Nasopharyngeal Swab  Result Value Ref Range Status   SARS Coronavirus 2 by RT PCR NEGATIVE NEGATIVE Final    Comment: (NOTE) SARS-CoV-2 target nucleic acids are NOT DETECTED.  The SARS-CoV-2 RNA is generally detectable in upper respiratoy specimens during the acute phase of infection. The lowest concentration of SARS-CoV-2 viral copies this assay can detect is 131 copies/mL. A negative result does not preclude SARS-Cov-2 infection and should not be used as the sole basis for treatment or other patient management decisions. A negative result may occur with  improper specimen collection/handling, submission of specimen other than nasopharyngeal swab, presence of viral mutation(s) within the areas targeted by this assay, and inadequate number of viral copies (<131 copies/mL). A negative result must be combined with clinical observations, patient history, and epidemiological information. The expected result is Negative.  Fact Sheet for  Patients:  PinkCheek.be  Fact Sheet for Healthcare Providers:  GravelBags.it  This test is no t yet approved or cleared by the [place] FDA and  has been authorized for detection and/or diagnosis of SARS-CoV-2 by FDA under an Emergency Use Authorization (EUA). This EUA will remain  in effect (meaning this test can be used) for the duration of the COVID-19 declaration under Section 564(b)(1) of the Act, 21 U.S.C. section 360bbb-3(b)(1), unless the authorization is terminated or revoked sooner.     Influenza A by PCR NEGATIVE NEGATIVE Final   Influenza B by PCR NEGATIVE NEGATIVE Final    Comment: (NOTE) The Xpert Xpress SARS-CoV-2/FLU/RSV assay is intended as an aid in  the diagnosis of influenza from Nasopharyngeal swab specimens and  should not be used as a sole basis  for treatment. Nasal washings and  aspirates are unacceptable for Xpert Xpress SARS-CoV-2/FLU/RSV  testing.  Fact Sheet for Patients: PinkCheek.be  Fact Sheet for Healthcare Providers: GravelBags.it  This test is not yet approved or cleared by the [place] FDA and  has been authorized for detection and/or diagnosis of SARS-CoV-2 by  FDA under an Emergency Use Authorization (EUA). This EUA will remain  in effect (meaning this test can be used) for the duration of the  Covid-19 declaration under Section 564(b)(1) of the Act, 21  U.S.C. section 360bbb-3(b)(1), unless the authorization is  terminated or revoked. Performed at [facility], [place] [address] [place], [place] [address]   MRSA PCR Screening     Status: None   Collection Time: 08/09/20  8:23 PM   Specimen: Nasal Mucosa; Nasopharyngeal  Result Value Ref Range Status   MRSA by PCR NEGATIVE NEGATIVE Final    Comment:        The GeneXpert MRSA Assay (FDA approved for NASAL specimens only), is one component of a comprehensive MRSA colonization surveillance program. It is not intended to diagnose MRSA infection nor to guide or monitor treatment for MRSA infections. Performed at [facility], [place] [address], [place], [place] [address]   Culture, blood (routine x 2)     Status: None (Preliminary result)   Collection Time: 08/11/20  6:48 AM   Specimen: BLOOD RIGHT HAND  Result Value Ref Range Status   Specimen Description BLOOD RIGHT HAND  Final   Special Requests   Final    BOTTLES DRAWN AEROBIC ONLY Blood Culture adequate volume   Culture   Final    NO GROWTH 1 DAY Performed at [facility], [place] [address] [place], [place] [address]    Report Status PENDING  Incomplete  Culture, blood (routine x 2)     Status: None (Preliminary result)   Collection Time: 08/11/20  6:49 AM   Specimen: BLOOD RIGHT WRIST  Result Value Ref Range Status    Specimen Description BLOOD RIGHT WRIST  Final   Special Requests   Final    BOTTLES DRAWN AEROBIC ONLY Blood Culture adequate volume   Culture   Final    NO GROWTH 1 DAY Performed at [facility], [place] [address], [place], [place] [address]    Report Status PENDING  Incomplete      Radiology Studies: NM Hepato W/EF  Result Date: 08/10/2020 CLINICAL DATA:  Abdominal pain. Constipation. Gallbladder stones on prior CT ultrasound 08/09/2020 also demonstrated gallstones EXAM: NUCLEAR MEDICINE HEPATOBILIARY IMAGING WITH GALLBLADDER EF TECHNIQUE: Sequential images of the abdomen were obtained out to 60 minutes following intravenous administration of  radiopharmaceutical. After oral ingestion of Ensure, gallbladder ejection fraction was determined. At 60 min, normal ejection fraction is greater than 33%. RADIOPHARMACEUTICALS:  4.2 mCi Tc-49m Choletec IV COMPARISON:  Ultrasound 08/09/2020 FINDINGS: There is prompt uptake of radiotracer within liver. Counts are slow to travel into the common bile duct. The gallbladder begins to fill at approximately 50 minutes. The gallbladder fills in completely by 60 minutes. Upon administration of fatty meal, the gallbladder does contract and counts are evident within the small bowel. Calculated gallbladder ejection fraction is near 100%%. (Normal gallbladder ejection fraction with Ensure is greater than 33%.) IMPRESSION: 1. Patent cystic duct and common bile duct. 2. Normal gallbladder ejection fraction. 3. The gallbladder has some delayed filling and does not appear to fill completely which could indicate chronic cholecystitis. Electronically Signed   By: SSuzy BouchardM.D.   On: 08/10/2020 16:53   VAS [place]I WITH/WO TBI  Result Date: 08/11/2020 LOWER EXTREMITY DOPPLER STUDY Indications: Ulceration. High Risk Factors: Hypertension, hyperlipidemia, Diabetes.  Limitations: Today's exam was limited due to patient positioning, an open wound              and  bandages. Comparison Study: No prior studies. Performing Technologist: CCarlos LeveringRVT  Examination Guidelines: A complete evaluation includes at minimum, Doppler waveform signals and systolic blood pressure reading at the level of bilateral brachial, anterior tibial, and posterior tibial arteries, when vessel segments are accessible. Bilateral testing is considered an integral part of a complete examination. Photoelectric Plethysmograph (PPG) waveforms and toe systolic pressure readings are included as required and additional duplex testing as needed. Limited examinations for reoccurring indications may be performed as noted.  ABI Findings: +--------+------------------+-----+----------+--------+ Right   Rt Pressure (mmHg)IndexWaveform  Comment  +--------+------------------+-----+----------+--------+ [id number]                   triphasic          +--------+------------------+-----+----------+--------+ PTA     254               1.55 monophasic         +--------+------------------+-----+----------+--------+ DP      254               1.55 monophasic         +--------+------------------+-----+----------+--------+ +--------+------------------+-----+----------+--------------+ Left    Lt Pressure (mmHg)IndexWaveform  Comment        +--------+------------------+-----+----------+--------------+ Brachial                                 Restricted arm +--------+------------------+-----+----------+--------------+ PTA     254               1.55 monophasic               +--------+------------------+-----+----------+--------------+ DP      254               1.55 monophasic               +--------+------------------+-----+----------+--------------+  Summary: Right: Resting right ankle-brachial index indicates noncompressible right lower extremity arteries. Unable to obtain TBI due to great toe ulceration. Left: Resting left ankle-brachial index indicates noncompressible left  lower extremity arteries. Unable to obtain TBI due to great toe ulceration.  *See table(s) above for measurements and observations.  Electronically signed by TCurt JewsMD on 08/11/2020 at 4:35:42 PM.   Final    VAS [place]OWER EXTREMITY ARTERIAL DUPLEX  Result  Date: 08/11/2020 LOWER EXTREMITY ARTERIAL DUPLEX STUDY Indications: Ulceration. High Risk Factors: Hypertension, hyperlipidemia, Diabetes.  Current ABI: Right and left PT and DP vessels are noncompressible. Limitations: Patient movement, patient anatomy Comparison Study: No prior studies. Performing Technologist: Oliver Hum RVT  Examination Guidelines: A complete evaluation includes B-mode imaging, spectral Doppler, color Doppler, and power Doppler as needed of all accessible portions of each vessel. Bilateral testing is considered an integral part of a complete examination. Limited examinations for reoccurring indications may be performed as noted.  +-----------+--------+-----+---------------+----------+--------+ RIGHT      PSV cm/sRatioStenosis       Waveform  Comments +-----------+--------+-----+---------------+----------+--------+ CFA Distal 33                          biphasic           +-----------+--------+-----+---------------+----------+--------+ DFA        207          50-74% stenosismonophasic         +-----------+--------+-----+---------------+----------+--------+ SFA Prox   519          75-99% stenosismonophasic         +-----------+--------+-----+---------------+----------+--------+ SFA Mid    63                          monophasic         +-----------+--------+-----+---------------+----------+--------+ SFA Distal 71                          monophasic         +-----------+--------+-----+---------------+----------+--------+ POP Prox   77                          monophasic         +-----------+--------+-----+---------------+----------+--------+ POP Distal 24                           monophasic         +-----------+--------+-----+---------------+----------+--------+ ATA Prox   45                          monophasic         +-----------+--------+-----+---------------+----------+--------+ ATA Mid    18                          monophasic         +-----------+--------+-----+---------------+----------+--------+ ATA Distal 22                          monophasic         +-----------+--------+-----+---------------+----------+--------+ PTA Prox   30                          monophasic         +-----------+--------+-----+---------------+----------+--------+ PTA Mid    42                          monophasic         +-----------+--------+-----+---------------+----------+--------+ PTA Distal 53                          monophasic         +-----------+--------+-----+---------------+----------+--------+ PERO Prox  29                          monophasic         +-----------+--------+-----+---------------+----------+--------+ PERO Mid   27                          monophasic         +-----------+--------+-----+---------------+----------+--------+ PERO Distal24                          monophasic         +-----------+--------+-----+---------------+----------+--------+ DP         43                          monophasic         +-----------+--------+-----+---------------+----------+--------+  +-----------+--------+-----+--------+----------+--------+ LEFT       PSV cm/sRatioStenosisWaveform  Comments +-----------+--------+-----+--------+----------+--------+ CFA Distal 102                  triphasic          +-----------+--------+-----+--------+----------+--------+ DFA        64                   biphasic           +-----------+--------+-----+--------+----------+--------+ SFA Prox   143                  triphasic          +-----------+--------+-----+--------+----------+--------+ SFA Mid    47                    biphasic           +-----------+--------+-----+--------+----------+--------+ SFA Distal 124                  biphasic           +-----------+--------+-----+--------+----------+--------+ POP Prox   114                  triphasic          +-----------+--------+-----+--------+----------+--------+ POP Distal 43                   biphasic           +-----------+--------+-----+--------+----------+--------+ ATA Prox   22                   monophasic         +-----------+--------+-----+--------+----------+--------+ ATA Mid    23                   monophasic         +-----------+--------+-----+--------+----------+--------+ ATA Distal 24                   monophasic         +-----------+--------+-----+--------+----------+--------+ PTA Prox   73                   monophasic         +-----------+--------+-----+--------+----------+--------+ PTA Mid    80                   monophasic         +-----------+--------+-----+--------+----------+--------+ PTA Distal 92                   monophasic         +-----------+--------+-----+--------+----------+--------+  PERO Prox  9                    monophasic         +-----------+--------+-----+--------+----------+--------+ PERO Mid   21                   monophasic         +-----------+--------+-----+--------+----------+--------+ PERO Distal28                   monophasic         +-----------+--------+-----+--------+----------+--------+ DP         38                   monophasic         +-----------+--------+-----+--------+----------+--------+  Summary: 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 Calcific plaque noted throughout all visualized lower extremity arteries. Left: Calcific plaque noted throughout all visualized lower extremity arteries.  See table(s) above for measurements and observations.    Preliminary       Scheduled Meds: . sodium chloride   Intravenous Once  . sodium chloride   Intravenous Once  . brimonidine  1 drop Right Eye BID  . collagenase   Topical Daily  . dorzolamide-timolol  1 drop Right Eye BID  . feeding supplement  237 mL Oral TID BM  . insulin aspart  0-9 Units Subcutaneous TID WC  . insulin glargine  8 Units Subcutaneous Daily  . multivitamin with minerals  1 tablet Oral Daily  . predniSONE  7.5 mg Oral Daily  . silver sulfADIAZINE   Topical Daily  . sodium bicarbonate  1,300 mg Oral BID  . tamsulosin  0.4 mg Oral BH-q7a   Continuous Infusions: . ceFEPime (MAXIPIME) IV 2 g (08/12/20 0902)  . heparin 750 Units/hr (08/12/20 0245)     LOS: 3 days    Phillips Climes MD. on 08/12/2020 at 2:09 PM  Between 7am to 7pm - Please see pager noted on [email or web address]  After 7pm go to [email or web address]  And look for the night coverage person covering for me after hours  [facility] Office  [phone]

## 2020-08-13 ENCOUNTER — Inpatient Hospital Stay: Payer: Medicare HMO

## 2020-08-13 DIAGNOSIS — Z23 Encounter for immunization: Secondary | ICD-10-CM

## 2020-08-13 DIAGNOSIS — R7989 Other specified abnormal findings of blood chemistry: Secondary | ICD-10-CM

## 2020-08-13 LAB — APTT: aPTT: 56 seconds — ABNORMAL HIGH (ref 24–36)

## 2020-08-13 LAB — COMPREHENSIVE METABOLIC PANEL
ALT: 95 U/L — ABNORMAL HIGH (ref 0–44)
AST: 30 U/L (ref 15–41)
Albumin: 1.6 g/dL — ABNORMAL LOW (ref 3.5–5.0)
Alkaline Phosphatase: 250 U/L — ABNORMAL HIGH (ref 38–126)
Anion gap: 6 (ref 5–15)
BUN: 22 mg/dL (ref 8–23)
CO2: 19 mmol/L — ABNORMAL LOW (ref 22–32)
Calcium: 10.4 mg/dL — ABNORMAL HIGH (ref 8.9–10.3)
Chloride: 112 mmol/L — ABNORMAL HIGH (ref 98–111)
Creatinine, Ser: 0.85 mg/dL (ref 0.61–1.24)
GFR, Estimated: >60 mL/min (ref >60)
Glucose, Bld: 154 mg/dL — ABNORMAL HIGH (ref 70–99)
Potassium: 4 mmol/L (ref 3.5–5.1)
Sodium: 137 mmol/L (ref 135–145)
Total Bilirubin: 0.9 mg/dL (ref 0.3–1.2)
Total Protein: 5.2 g/dL — ABNORMAL LOW (ref 6.5–8.1)

## 2020-08-13 LAB — CBC
HCT: 29.4 % — ABNORMAL LOW (ref 39.0–52.0)
Hemoglobin: 9.1 g/dL — ABNORMAL LOW (ref 13.0–17.0)
MCH: 26.3 pg (ref 26.0–34.0)
MCHC: 31 g/dL (ref 30.0–36.0)
MCV: 85 fL (ref 80.0–100.0)
Platelets: 124 10*3/uL — ABNORMAL LOW (ref 150–400)
RBC: 3.46 MIL/uL — ABNORMAL LOW (ref 4.22–5.81)
RDW: 19.6 % — ABNORMAL HIGH (ref 11.5–15.5)
WBC: 4.5 10*3/uL (ref 4.0–10.5)
nRBC: 0 % (ref 0.0–0.2)

## 2020-08-13 LAB — TYPE AND SCREEN
ABO/RH(D): A POS
Antibody Screen: NEGATIVE
Unit division: 0
Unit division: 0

## 2020-08-13 LAB — GLUCOSE, CAPILLARY
Glucose-Capillary: 86 mg/dL (ref 70–99)
Glucose-Capillary: 101 mg/dL — ABNORMAL HIGH (ref 70–99)
Glucose-Capillary: 72 mg/dL (ref 70–99)
Glucose-Capillary: 67 mg/dL — ABNORMAL LOW (ref 70–99)
Glucose-Capillary: 79 mg/dL (ref 70–99)

## 2020-08-13 LAB — BPAM RBC
Blood Product Expiration Date: 202112122359
Blood Product Expiration Date: 202112132359
ISSUE DATE / TIME: 202111162353
ISSUE DATE / TIME: 202111171451
Unit Type and Rh: 6200
Unit Type and Rh: 6200

## 2020-08-13 LAB — PROTIME-INR
INR: 1.3 — ABNORMAL HIGH (ref 0.8–1.2)
Prothrombin Time: 15.4 seconds — ABNORMAL HIGH (ref 11.4–15.2)

## 2020-08-13 LAB — HEPARIN LEVEL (UNFRACTIONATED): Heparin Unfractionated: 0.1 IU/mL — ABNORMAL LOW (ref 0.30–0.70)

## 2020-08-13 MED ORDER — APIXABAN 5 MG PO TABS
5.0000 mg | ORAL_TABLET | Freq: Two times a day (BID) | ORAL | Status: DC
  Administered 2020-08-13 – 2020-08-15 (×5): 5 mg via ORAL
  Filled 2020-08-13 (×5): qty 1

## 2020-08-13 MED ORDER — PANTOPRAZOLE SODIUM 40 MG PO TBEC
40.0000 mg | DELAYED_RELEASE_TABLET | Freq: Every day | ORAL | Status: DC
  Administered 2020-08-13 – 2020-08-15 (×3): 40 mg via ORAL
  Filled 2020-08-13 (×4): qty 1

## 2020-08-13 MED ORDER — K PHOS MONO-SOD PHOS DI & MONO 155-852-130 MG PO TABS
500.0000 mg | ORAL_TABLET | Freq: Three times a day (TID) | ORAL | Status: DC
  Administered 2020-08-13 (×2): 500 mg via ORAL
  Filled 2020-08-13 (×3): qty 2

## 2020-08-13 NOTE — Progress Notes (Signed)
[place] for Heparin>>>>>>>Apixaban Indication: atrial fibrillation  Allergies  Allergen Reactions  . Iodinated Diagnostic Agents Hives  . Tape Hives    Patient Measurements: Height: 5\' 11"  (180.3 cm) Weight: 72.6 kg (160 lb) IBW/kg (Calculated) : 75.3  Vital Signs: Temp: 97.8 F (36.6 C) (11/18 0410) Temp Source: Oral (11/18 0410) BP: 166/79 (11/18 0410) Pulse Rate: 75 (11/18 0004)  Labs: Recent Labs    08/11/20 0230 08/11/20 1119 08/11/20 1644 08/11/20 1644 08/11/20 2209 08/12/20 0558 08/13/20 0110  HGB 7.1*  --  6.8*   < >  --  7.9* 9.1*  HCT 23.7*  --  23.3*  --   --  27.1* 29.4*  PLT 135*  --   --   --   --  134* 124*  APTT 125*   < >  --   --  82* 68* 56*  LABPROT  --   --   --   --   --  14.8 15.4*  INR  --   --   --   --   --  1.2 1.3*  HEPARINUNFRC 1.22*  --   --   --   --  0.44 0.10*  CREATININE 1.11  --   --   --   --  0.95 0.85   < > = values in this interval not displayed.    Estimated Creatinine Clearance: 83 mL/min (by C-G formula based on SCr of 0.85 mg/dL).   Medical History: Past Medical History:  Diagnosis Date  . Allergic rhinitis   . BPH (benign prostatic hyperplasia)   . Chronic atrial fibrillation ([place])   . Diabetes mellitus ([place])   . DVT (deep venous thrombosis) ([facility])    LLE  . Hematochezia   . HTN (hypertension)   . Hypomagnesemia 03/20/2019  . Kidney transplant recipient   . PVD (peripheral vascular disease) ([facility])     Assessment: Pt has been on IV heparin while apixaban was on hold. Now re-starting PTA apixaban   Goal of Therapy:  Monitor platelets by anticoagulation protocol: Yes   Plan:  DC heparin Re-start apixaban 5mg  BID Daily CBC Monitor for bleeding  Narda Bonds, PharmD, BCPS Clinical Pharmacist Phone: [phone]

## 2020-08-13 NOTE — Progress Notes (Signed)
[place] for heparin Indication: atrial fibrillation  Allergies  Allergen Reactions  . Iodinated Diagnostic Agents Hives  . Tape Hives    Patient Measurements: Height: 5\' 11"  (180.3 cm) Weight: 72.6 kg (160 lb) IBW/kg (Calculated) : 75.3 Heparin Dosing Weight: 72.6kg  Vital Signs: Temp: 98.4 F (36.9 C) (11/18 0004) Temp Source: Oral (11/18 0004) BP: 137/86 (11/18 0004) Pulse Rate: 75 (11/18 0004)  Assessment: 69 y.o. male with h/o Afib, Eliquis on hold, for heparin  Goal of Therapy:  Heparin level 0.3-0.7 units/mL Monitor platelets by anticoagulation protocol: Yes   Plan:  Increase Heparin 1000 units/hr Check heparin level in 8 hours.   Phillis Knack, PharmD, BCPS  08/13/2020 2:05 AM

## 2020-08-13 NOTE — Plan of Care (Signed)

## 2020-08-13 NOTE — Progress Notes (Signed)
   Covid-19 Vaccination Clinic  Name:  Ryan Rice    MRN: [id number] DOB: [birth year]  08/13/2020  Ryan Rice was observed post Covid-19 immunization for 15 minutes without incident. He was provided with Vaccine Information Sheet and instruction to access the V-Safe system.   Ryan Rice was instructed to call 911 with any severe reactions post vaccine: Marland Kitchen Difficulty breathing  . Swelling of face and throat  . A fast heartbeat  . A bad rash all over body  . Dizziness and weakness   Immunizations Administered    Name Date Dose VIS Date Route   Pfizer COVID-19 Vaccine 08/13/2020  1:01 PM 0.3 mL 07/15/2020 Intramuscular   Manufacturer: [place]   Lot: [id number]   NDC: [address]-0

## 2020-08-13 NOTE — Evaluation (Signed)
Physical Therapy Evaluation Patient Details Name: Ryan Rice MRN: [id number] DOB: August 08, 1951 Today's Date: 08/13/2020   History of Present Illness  69 yo admitted with AMS, fever, bacteremia of unknowns source with possible PNA. 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 After talking to wife on the phone, pt was able to get to EOB. Pt showed decreased ability to perform bed mobility and sitting balance independently. Standing and ambulation was held due to pts AMS, decreased strength and endurance, and pts wife stating he is bed bound at home. Pt would benefit from continued acute PT in order to increase independence on functional mobility. Pt would benefit from being d/c to a SNF due to level of function, but this is based on caregivers ability to take care of him at home.    Follow Up Recommendations SNF;Supervision/Assistance - 24 hour;Home health PT (pending caregivers ability to take care of him)    Equipment Recommendations       Recommendations for Other Services OT consult     Precautions / Restrictions Restrictions Weight Bearing Restrictions: No      Mobility  Bed Mobility Overal bed mobility: Needs Assistance Bed Mobility: Supine to Sit;Sit to Supine     Supine to sit: Mod assist;+2 for physical assistance Sit to supine: Mod assist;+2 for physical assistance   General bed mobility comments: pt min assist for cues to grab onto railing and trunk control during supine to/from sit and mod assist to bring legs full onto and off the bed    Transfers                 General transfer comment: not attempted  Ambulation/Gait             General Gait Details: not attempted  Stairs            Wheelchair Mobility    Modified Rankin (Stroke Patients Only)       Balance Overall balance  assessment: Needs assistance Sitting-balance support: Bilateral upper extremity supported;Feet supported Sitting balance-Leahy Scale: Poor Sitting balance - Comments: pt mostly min assist for sitting but had one episode of mod assist with a posterior lean while performing LE exercises Postural control: Posterior lean     Standing balance comment: standing not attempted                             Pertinent Vitals/Pain Pain Assessment: 0-10 Pain Score: 6  Pain Location: bilateral feet Pain Intervention(s): Monitored during session    Home Living Family/patient expects to be discharged to:: Private residence Living Arrangements: Spouse/significant other Available Help at Discharge: Family;Available 24 hours/day Type of Home: House Home Access: Stairs to enter Entrance Stairs-Rails: Right Entrance Stairs-Number of Steps: 1 Home Layout: One level Home Equipment: Shower seat;Shower seat - built in;Bedside commode;Hospital bed;Wheelchair - Education administrator (comment) Additional Comments: hoyer lift with assist of wife and caregiver    Prior Function Level of Independence: Needs assistance   Gait / Transfers Assistance Needed: bed bound at home receiving home services     Comments: wife does driving, cleaning, cooking     Hand Dominance   Dominant Hand: Right    Extremity/Trunk Assessment   Upper Extremity Assessment Upper Extremity Assessment: Defer to OT evaluation    Lower Extremity Assessment Lower Extremity Assessment: RLE deficits/detail;LLE  deficits/detail       Communication   Communication: Expressive difficulties  Cognition Arousal/Alertness: Lethargic Behavior During Therapy:  (confused) Overall Cognitive Status: No family/caregiver present to determine baseline cognitive functioning Area of Impairment: Orientation;Attention;Following commands;Awareness                 Orientation Level: Disoriented to;Place;Time;Situation (unable to say  birthday) Current Attention Level: Focused   Following Commands: Follows one step commands inconsistently;Follows one step commands with increased time   Awareness: Intellectual          General Comments      Exercises General Exercises - Lower Extremity Long Arc Quad: AAROM;Strengthening;Both;Seated;15 reps Hip Flexion/Marching: AAROM;Strengthening;Both;15 reps;Seated Toe Raises: AROM;Right;Seated;AAROM;Left;10 reps   Assessment/Plan    PT Assessment Patient needs continued PT services  PT Problem List Decreased strength;Decreased cognition;Decreased activity tolerance;Decreased skin integrity;Decreased mobility       PT Treatment Interventions Therapeutic exercise;Therapeutic activities;Functional mobility training;Patient/family education;Cognitive remediation;Balance training;Stair training;Gait training    PT Goals (Current goals can be found in the Care Plan section)  Acute Rehab PT Goals Patient Stated Goal: to go home PT Goal Formulation: With patient Time For Goal Achievement: 08/27/20 Potential to Achieve Goals: Fair    Frequency Min 2X/week   Barriers to discharge        Co-evaluation               AM-PAC PT "6 Clicks" Mobility  Outcome Measure Help needed turning from your back to your side while in a flat bed without using bedrails?: A Lot Help needed moving from lying on your back to sitting on the side of a flat bed without using bedrails?: A Lot Help needed moving to and from a bed to a chair (including a wheelchair)?: A Lot Help needed standing up from a chair using your arms (e.g., wheelchair or bedside chair)?: A Lot Help needed to walk in hospital room?: Total Help needed climbing 3-5 steps with a railing? : Total 6 Click Score: 10    End of Session Equipment Utilized During Treatment: Gait belt Activity Tolerance: Patient tolerated treatment well Patient left: in bed;with call bell/phone within reach;with bed alarm set   PT Visit  Diagnosis: Pain;Muscle weakness (generalized) (M62.81) Pain - Right/Left:  (both) Pain - part of body: Ankle and joints of foot    Time: 1222-1300 PT Time Calculation (min) (ACUTE ONLY): 38 min   Charges:   PT Evaluation $PT Eval Moderate Complexity: 1 Mod PT Treatments $Therapeutic Activity: 8-22 mins        Caleb Popp, SPT [id number]  Havish Petties 08/13/2020, 1:54 PM

## 2020-08-13 NOTE — Progress Notes (Addendum)
PROGRESS NOTE    Ryan Rice  JHE:[id number] DOB: 1951/07/10 DOA: 08/09/2020 PCP: Mina Marble, PA-C   Brief Narrative:  HPI on 08/09/2020 by Dr. Tennis Must Carder Ryan Rice is a 69 y.o. male with medical history significant of allergic rhinitis, BPH, glaucoma, type 2 diabetes, hyperlipidemia, history of DVT, hypertension, chronic atrial fibrillation, history of embolic CVA, history of hypomagnesemia, kidney transplant recipient, peripheral vascular disease who was brought to the emergency department with complaints of altered mental status and fever.  His wife states that earlier in the day he was having abdominal pain, felt constipated and she gave him a stool softeners without significant relief.  Later the patient started feeling cold and shivering.  This was followed by decreased mentation.  His mental status has improved and he is able to answer some questions.  He had abdominal pain earlier in the week and felt nauseous, but this was self-limited.  He has been having RUQ pain for some time now, but does not know how long.  Interim history Patient admitted with AMS. Found to have possible pneumonia and bacteremia.  Patient with elevated LFTs with cholelithiasis however HIDA scan indicating chronic cholecystitis.  Also found to have bacteremia, currently on IV antibiotics.  Pending ABI.  General surgery, Ortho and vascular surgery consulted.  Subjective:   Davan Nawabi seen and examined today.  Denies any chest pain, shortness of breath, fever or chills, as discussed with staff he remains with poor appetite.   Assessment & Plan   Acute metabolic encepalopathy -This has improved, he is awake alert x2, more conversant, and answering most of the questions appropriately . -per wife, patient has had memory issues that is intermittent since his previous stroke -Suspect secondary to the infection -Ammonia level 24 -Continue to monitor  Pseudomonas bacteremia/Possible due to  community-acquired pneumonia -Patient was admitted with sepsis secondary to unknown source however patient did not have sirs criteria only elevated lactic acid level of 2.2.  As of now, sepsis ruled out-however patient is immune compromised -Chest x-ray: Reviewed appears patient may have a left infiltrate -Initially placed on vancomycin and Zosyn -Vancomycin discontinued due to negative MRSA -Blood cultures 1/2 aerobic bottle showing gram-negative rods, Pseudomonas aeruginosa-sensitivities show resistance to Zosyn, transitioned to cefepime. -Covid and influenza negative  Elevated LFTs/cholelithiasis, possible cholecystitis -AST 685, ALT 455, alk phos 517 on admission- LFTs continue to trend downward -Abdominal ultrasound obtained showing cholelithiasis with borderline gallbladder wall thickening and small amount of pericholecystic fluid -HIDA scan negative for acute cholecystitis, indicative of likely chronic cholecystitis -General surgery consulted and appreciated, no indication for emergent or urgent cholecystostomy at this time, patient may follow-up with [facility] in the clinic  Multiple pressure skin ulcers/severe PVD -Present on admission -Wound care consulted -Do not appear to be infected -Patient has been seeing a podiatrist associated with [facility]. -Vascular surgery orthopedic consult greatly appreciated, patient is very poor candidate for revascularization option, current plan to continue with local wound care, at one point likely he will need 1 or bilateral amputation once wound ulcers or he develop OM/infected wound .  Hypokalemia -Resolved with replacement, continue to monitor and replace as needed  Hypophosphatemia -Resolved with replacement, continue to monitor BMP  Kidney transplant -Currently on prednisone -Tacrolimus and mycophenolate will be resumed   Diabetes mellitus, type II -Placed on carb modified diet -Lantus initially held as patient was n.p.o. -Will restart Lantus  today at reduced dose, 8 units -Continue insulin sliding scale CBG monitoring  Glaucoma -Continue eyedrops  Atrial fibrillation, chronic -CHA2DS2-VASc score at least 6 -Metoprolol held as patient is currently bradycardic -Currently no procedures anticipated, so will transition from heparin GTT to Eliquis.  Essential hypertension -Medications currently held, continue to monitor BP and treat accordingly  Hyperlipidemia -Statin currently held due to elevated LFTs  BPH -Continue Flomax, Proscar  Microcytic anemia/anemia of chronic illness -chronic, around baseline, he received units PRBC transfusion with good response -hemoccult if negative.  Wife requesting him to get the booster pfizer, as well she reports he is active with [facility].active with [facility].  DVT Prophylaxis  Eliquis --> Heparin  Code Status: Full  Family Communication: none at bedside.  Discussed with wife by phone 08/13/2020  Disposition Plan:  Status is: Inpatient  Remains inpatient appropriate because:Altered mental status, Ongoing diagnostic testing needed not appropriate for outpatient work up, IV treatments appropriate due to intensity of illness or inability to take PO and Inpatient level of care appropriate due to severity of illness   Dispo: The patient is from: Home              Anticipated d/c is to: TBD              Anticipated d/c date is: 3 days              Patient currently is not medically stable to d/c.   Consultants General surgery  Procedures  Abdominal ultrasound  Antibiotics   Anti-infectives (From admission, onward)   Start     Dose/Rate Route Frequency Ordered Stop   08/11/20 1800  ceFEPIme (MAXIPIME) 2 g in sodium chloride 0.9 % 100 mL IVPB        2 g 200 mL/hr over 30 Minutes Intravenous Every 8 hours 08/11/20 0956     08/11/20 1030  ceFEPIme (MAXIPIME) 2 g in sodium chloride 0.9 % 100 mL IVPB        2 g 200 mL/hr over 30 Minutes Intravenous NOW  08/11/20 0956 08/11/20 1117   08/10/20 0600  vancomycin (VANCOCIN) IVPB 1000 mg/200 mL premix  Status:  Discontinued        1,000 mg 200 mL/hr over 60 Minutes Intravenous Every 24 hours 08/09/20 0516 08/09/20 1208   08/09/20 2200  ceFEPIme (MAXIPIME) 2 g in sodium chloride 0.9 % 100 mL IVPB  Status:  Discontinued        2 g 200 mL/hr over 30 Minutes Intravenous Every 12 hours 08/09/20 1209 08/09/20 1357   08/09/20 2200  piperacillin-tazobactam (ZOSYN) IVPB 3.375 g  Status:  Discontinued        3.375 g 12.5 mL/hr over 240 Minutes Intravenous Every 8 hours 08/09/20 1357 08/11/20 0956   08/09/20 1208  vancomycin variable dose per unstable renal function (pharmacist dosing)  Status:  Discontinued         Does not apply See admin instructions 08/09/20 1208 08/10/20 0949   08/09/20 1000  ceFEPIme (MAXIPIME) 2 g in sodium chloride 0.9 % 100 mL IVPB  Status:  Discontinued        2 g 200 mL/hr over 30 Minutes Intravenous Every 8 hours 08/09/20 0514 08/09/20 1209   08/09/20 0600  Ampicillin-Sulbactam (UNASYN) 3 g in sodium chloride 0.9 % 100 mL IVPB  Status:  Discontinued       Note to Pharmacy: Adjust antibiotics as needed in the absence of metronidazole. TY.   3 g 200 mL/hr over 30 Minutes Intravenous Every 6 hours 08/09/20 0502 08/09/20 1357   08/09/20 0515  ceFEPIme (MAXIPIME) 2 g in sodium chloride 0.9 % 100 mL IVPB  Status:  Discontinued        2 g 200 mL/hr over 30 Minutes Intravenous  Once 08/09/20 0502 08/09/20 0514   08/09/20 0515  vancomycin (VANCOREADY) IVPB 1500 mg/300 mL        1,500 mg 150 mL/hr over 120 Minutes Intravenous  Once 08/09/20 0502 08/09/20 0803   08/09/20 0115  ceFEPIme (MAXIPIME) 2 g in sodium chloride 0.9 % 100 mL IVPB        2 g 200 mL/hr over 30 Minutes Intravenous  Once 08/09/20 0105 08/09/20 0229   08/09/20 0115  metroNIDAZOLE (FLAGYL) IVPB 500 mg  Status:  Discontinued        500 mg 100 mL/hr over 60 Minutes Intravenous Every 8 hours 08/09/20 0105 08/09/20 0511    08/09/20 0115  vancomycin (VANCOCIN) IVPB 1000 mg/200 mL premix  Status:  Discontinued        1,000 mg 200 mL/hr over 60 Minutes Intravenous  Once 08/09/20 0105 08/09/20 0108   08/09/20 0115  vancomycin (VANCOREADY) IVPB 1500 mg/300 mL        1,500 mg 150 mL/hr over 120 Minutes Intravenous  Once 08/09/20 0108 08/09/20 0552       Objective:   Vitals:   08/12/20 2019 08/13/20 0004 08/13/20 0410 08/13/20 0715  BP: (!) 164/82 137/86 (!) 166/79 (!) 150/70  Pulse:  75  (!) 52  Resp:  14  15  Temp: 98.3 F (36.8 C) 98.4 F (36.9 C) 97.8 F (36.6 C) 97.9 F (36.6 C)  TempSrc: Oral Oral Oral Oral  SpO2:  100%  99%  Weight:      Height:        Intake/Output Summary (Last 24 hours) at 08/13/2020 1142 Last data filed at 08/13/2020 0630 Gross per 24 hour  Intake 315 ml  Output 1200 ml  Net -885 ml   Filed Weights   08/09/20 0100  Weight: 72.6 kg   Exam  Awake Alert, oriented x2, extremely frail, deconditioned and chronically ill-appearing  Symmetrical Chest wall movement, diminished air entry at the bases RRR,No Gallops,Rubs or new Murmurs, No Parasternal Heave +ve B.Sounds, Abd Soft, No tenderness, No rebound - guarding or rigidity. Patient was left gluteal pressure ulcer, please see picture below, and his feet bilaterally are bandaged.    Data Reviewed: I have personally reviewed following labs and imaging studies  CBC: Recent Labs  Lab 08/09/20 0105 08/09/20 0105 08/10/20 0045 08/11/20 0230 08/11/20 1644 08/12/20 0558 08/13/20 0110  WBC 9.8  --  6.2 3.3*  --  3.7* 4.5  NEUTROABS 8.9*  --  5.7  --   --   --   --   HGB 7.6*   < > 7.8* 7.1* 6.8* 7.9* 9.1*  HCT 26.4*   < > 26.0* 23.7* 23.3* 27.1* 29.4*  MCV 87.4  --  85.0 85.6  --  86.6 85.0  PLT 164  --  96* 135*  --  134* 124*   < > = values in this interval not displayed.   Basic Metabolic Panel: Recent Labs  Lab 08/09/20 0105 08/09/20 0520 08/10/20 0045 08/11/20 0230 08/12/20 0558 08/13/20 0110   NA 139  --  137 139 138 137  K 2.9*  --  4.9 3.9 4.3 4.0  CL 107  --  111 109 112* 112*  CO2 21*  --  18* 22 19* 19*  GLUCOSE 200*  --  141* 297*  235* 154*  BUN 47*  --  39* 31* 25* 22  CREATININE 1.24  --  1.05 1.11 0.95 0.85  CALCIUM 10.3  --  10.0 10.0 10.3 10.4*  MG  --  1.7  --   --   --   --   PHOS  --  2.0*  --   --   --   --    GFR: Estimated Creatinine Clearance: 83 mL/min (by C-G formula based on SCr of 0.85 mg/dL). Liver Function Tests: Recent Labs  Lab 08/09/20 0105 08/10/20 0045 08/11/20 0230 08/12/20 0558 08/13/20 0110  AST 685* 213* 81* 39 30  ALT 455* 285* 180* 125* 95*  ALKPHOS 517* 418* 317* 276* 250*  BILITOT 1.6* 2.6* 1.0 1.0 0.9  PROT 5.5* 5.6* 5.5* 5.5* 5.2*  ALBUMIN 1.9* 1.9* 1.7* 1.8* 1.6*   No results for input(s): LIPASE, AMYLASE in the last 168 hours. Recent Labs  Lab 08/09/20 0608  AMMONIA 24   Coagulation Profile: Recent Labs  Lab 08/09/20 0105 08/10/20 0045 08/12/20 0558 08/13/20 0110  INR 2.2* 2.2* 1.2 1.3*   Cardiac Enzymes: No results for input(s): CKTOTAL, CKMB, CKMBINDEX, TROPONINI in the last 168 hours. BNP (last 3 results) No results for input(s): PROBNP in the last 8760 hours. HbA1C: No results for input(s): HGBA1C in the last 72 hours. CBG: Recent Labs  Lab 08/12/20 0833 08/12/20 1214 08/12/20 1608 08/12/20 2128 08/13/20 0752  GLUCAP 170* 163* 125* 97 86   Lipid Profile: No results for input(s): CHOL, HDL, LDLCALC, TRIG, CHOLHDL, LDLDIRECT in the last 72 hours. Thyroid Function Tests: No results for input(s): TSH, T4TOTAL, FREET4, T3FREE, THYROIDAB in the last 72 hours. Anemia Panel: Recent Labs    08/12/20 0558  VITAMINB12 885  FOLATE 18.5  FERRITIN 1,407*  TIBC 113*  IRON 104  RETICCTPCT 2.1   Urine analysis:    Component Value Date/Time   COLORURINE YELLOW 08/11/2020 [place] 08/11/2020 0439   LABSPEC 1.016 08/11/2020 0439   PHURINE 8.0 08/11/2020 0439   GLUCOSEU >=500 (A)  08/11/2020 0439   HGBUR MODERATE (A) 08/11/2020 0439   BILIRUBINUR NEGATIVE 08/11/2020 [place] 08/11/2020 0439   PROTEINUR 30 (A) 08/11/2020 0439   NITRITE NEGATIVE 08/11/2020 0439   LEUKOCYTESUR NEGATIVE 08/11/2020 0439   Sepsis Labs: @LABRCNTIP (procalcitonin:4,lacticidven:4)  ) Recent Results (from the past 240 hour(s))  Culture, blood (x 2)     Status: Abnormal   Collection Time: 08/09/20  1:47 AM   Specimen: BLOOD  Result Value Ref Range Status   Specimen Description BLOOD LEFT ANTECUBITAL  Final   Special Requests   Final    BOTTLES DRAWN AEROBIC AND ANAEROBIC Blood Culture adequate volume   Culture  Setup Time   Final    GRAM NEGATIVE RODS AEROBIC BOTTLE ONLY Organism ID to follow CRITICAL RESULT CALLED TO, READ BACK BY AND VERIFIED WITH: PHRMD L SEAY @0041  08/10/20 BY S GEZAHEGN Performed at [facility], [place] [address] [place], [place] [address]    Culture PSEUDOMONAS AERUGINOSA (A)  Final   Report Status 08/11/2020 FINAL  Final   Organism ID, Bacteria PSEUDOMONAS AERUGINOSA  Final      Susceptibility   Pseudomonas aeruginosa - MIC*    CEFTAZIDIME 32 RESISTANT Resistant     CIPROFLOXACIN 1 SENSITIVE Sensitive     GENTAMICIN <=1 SENSITIVE Sensitive     IMIPENEM >=16 RESISTANT Resistant     PIP/TAZO >=128 RESISTANT Resistant  CEFEPIME 8 SENSITIVE Sensitive     * PSEUDOMONAS AERUGINOSA  Blood Culture ID Panel (Reflexed)     Status: Abnormal   Collection Time: 08/09/20  1:47 AM  Result Value Ref Range Status   Enterococcus faecalis NOT DETECTED NOT DETECTED Final   Enterococcus Faecium NOT DETECTED NOT DETECTED Final   Listeria monocytogenes NOT DETECTED NOT DETECTED Final   Staphylococcus species NOT DETECTED NOT DETECTED Final   Staphylococcus aureus (BCID) NOT DETECTED NOT DETECTED Final   Staphylococcus epidermidis NOT DETECTED NOT DETECTED Final   Staphylococcus lugdunensis NOT DETECTED NOT DETECTED Final   Streptococcus species  NOT DETECTED NOT DETECTED Final   Streptococcus agalactiae NOT DETECTED NOT DETECTED Final   Streptococcus pneumoniae NOT DETECTED NOT DETECTED Final   Streptococcus pyogenes NOT DETECTED NOT DETECTED Final   A.calcoaceticus-baumannii NOT DETECTED NOT DETECTED Final   Bacteroides fragilis NOT DETECTED NOT DETECTED Final   Enterobacterales NOT DETECTED NOT DETECTED Final   Enterobacter cloacae complex NOT DETECTED NOT DETECTED Final   Escherichia coli NOT DETECTED NOT DETECTED Final   Klebsiella aerogenes NOT DETECTED NOT DETECTED Final   Klebsiella oxytoca NOT DETECTED NOT DETECTED Final   Klebsiella pneumoniae NOT DETECTED NOT DETECTED Final   Proteus species NOT DETECTED NOT DETECTED Final   Salmonella species NOT DETECTED NOT DETECTED Final   Serratia marcescens NOT DETECTED NOT DETECTED Final   Haemophilus influenzae NOT DETECTED NOT DETECTED Final   Neisseria meningitidis NOT DETECTED NOT DETECTED Final   Pseudomonas aeruginosa DETECTED (A) NOT DETECTED Final    Comment: CRITICAL RESULT CALLED TO, READ BACK BY AND VERIFIED WITH: PHRMD L SEAY @0041  08/10/20 BY S GEZAHEGN    Stenotrophomonas maltophilia NOT DETECTED NOT DETECTED Final   Candida albicans NOT DETECTED NOT DETECTED Final   Candida auris NOT DETECTED NOT DETECTED Final   Candida glabrata NOT DETECTED NOT DETECTED Final   Candida krusei NOT DETECTED NOT DETECTED Final   Candida parapsilosis NOT DETECTED NOT DETECTED Final   Candida tropicalis NOT DETECTED NOT DETECTED Final   Cryptococcus neoformans/gattii NOT DETECTED NOT DETECTED Final   CTX-M ESBL NOT DETECTED NOT DETECTED Final   Carbapenem resistance IMP NOT DETECTED NOT DETECTED Final   Carbapenem resistance KPC NOT DETECTED NOT DETECTED Final   Carbapenem resistance NDM NOT DETECTED NOT DETECTED Final   Carbapenem resistance VIM NOT DETECTED NOT DETECTED Final    Comment: Performed at [facility], [address] [place], [place] [address]  Culture,  blood (x 2)     Status: None (Preliminary result)   Collection Time: 08/09/20  2:21 AM   Specimen: BLOOD RIGHT HAND  Result Value Ref Range Status   Specimen Description BLOOD RIGHT HAND  Final   Special Requests   Final    BOTTLES DRAWN AEROBIC AND ANAEROBIC Blood Culture adequate volume   Culture   Final    NO GROWTH 3 DAYS Performed at [facility], [place] [address], [place], [place] [address]    Report Status PENDING  Incomplete  Respiratory Panel by RT PCR (Flu A&B, Covid) - Nasopharyngeal Swab     Status: None   Collection Time: 08/09/20  7:14 AM   Specimen: Nasopharyngeal Swab  Result Value Ref Range Status   SARS Coronavirus 2 by RT PCR NEGATIVE NEGATIVE Final    Comment: (NOTE) SARS-CoV-2 target nucleic acids are NOT DETECTED.  The SARS-CoV-2 RNA is generally detectable in upper respiratoy specimens during the acute phase of infection. The lowest concentration  of SARS-CoV-2 viral copies this assay can detect is 131 copies/mL. A negative result does not preclude SARS-Cov-2 infection and should not be used as the sole basis for treatment or other patient management decisions. A negative result may occur with  improper specimen collection/handling, submission of specimen other than nasopharyngeal swab, presence of viral mutation(s) within the areas targeted by this assay, and inadequate number of viral copies (<131 copies/mL). A negative result must be combined with clinical observations, patient history, and epidemiological information. The expected result is Negative.  Fact Sheet for Patients:  PinkCheek.be  Fact Sheet for Healthcare Providers:  GravelBags.it  This test is no t yet approved or cleared by the [place] FDA and  has been authorized for detection and/or diagnosis of SARS-CoV-2 by FDA under an Emergency Use Authorization (EUA). This EUA will remain  in effect (meaning this test can be used)  for the duration of the COVID-19 declaration under Section 564(b)(1) of the Act, 21 U.S.C. section 360bbb-3(b)(1), unless the authorization is terminated or revoked sooner.     Influenza A by PCR NEGATIVE NEGATIVE Final   Influenza B by PCR NEGATIVE NEGATIVE Final    Comment: (NOTE) The Xpert Xpress SARS-CoV-2/FLU/RSV assay is intended as an aid in  the diagnosis of influenza from Nasopharyngeal swab specimens and  should not be used as a sole basis for treatment. Nasal washings and  aspirates are unacceptable for Xpert Xpress SARS-CoV-2/FLU/RSV  testing.  Fact Sheet for Patients: PinkCheek.be  Fact Sheet for Healthcare Providers: GravelBags.it  This test is not yet approved or cleared by the [place] FDA and  has been authorized for detection and/or diagnosis of SARS-CoV-2 by  FDA under an Emergency Use Authorization (EUA). This EUA will remain  in effect (meaning this test can be used) for the duration of the  Covid-19 declaration under Section 564(b)(1) of the Act, 21  U.S.C. section 360bbb-3(b)(1), unless the authorization is  terminated or revoked. Performed at [facility], [place] [address], [place], [place] [address]   MRSA PCR Screening     Status: None   Collection Time: 08/09/20  8:23 PM   Specimen: Nasal Mucosa; Nasopharyngeal  Result Value Ref Range Status   MRSA by PCR NEGATIVE NEGATIVE Final    Comment:        The GeneXpert MRSA Assay (FDA approved for NASAL specimens only), is one component of a comprehensive MRSA colonization surveillance program. It is not intended to diagnose MRSA infection nor to guide or monitor treatment for MRSA infections. Performed at [facility], [place] [address], [place], [place] [address]   Culture, blood (routine x 2)     Status: None (Preliminary result)   Collection Time: 08/11/20  6:48 AM   Specimen: BLOOD RIGHT HAND  Result Value Ref Range Status    Specimen Description BLOOD RIGHT HAND  Final   Special Requests   Final    BOTTLES DRAWN AEROBIC ONLY Blood Culture adequate volume   Culture   Final    NO GROWTH 1 DAY Performed at [facility], [place] [address], [place], [place] [address]    Report Status PENDING  Incomplete  Culture, blood (routine x 2)     Status: None (Preliminary result)   Collection Time: 08/11/20  6:49 AM   Specimen: BLOOD RIGHT WRIST  Result Value Ref Range Status   Specimen Description BLOOD RIGHT WRIST  Final   Special Requests   Final    BOTTLES DRAWN  AEROBIC ONLY Blood Culture adequate volume   Culture   Final    NO GROWTH 1 DAY Performed at [facility], [place] [address] [place], [place] [address]    Report Status PENDING  Incomplete      Radiology Studies: VAS [place] ABI WITH/WO TBI  Result Date: 08/11/2020 LOWER EXTREMITY DOPPLER STUDY Indications: Ulceration. High Risk Factors: Hypertension, hyperlipidemia, Diabetes.  Limitations: Today's exam was limited due to patient positioning, an open wound              and bandages. Comparison Study: No prior studies. Performing Technologist: Carlos Levering RVT  Examination Guidelines: A complete evaluation includes at minimum, Doppler waveform signals and systolic blood pressure reading at the level of bilateral brachial, anterior tibial, and posterior tibial arteries, when vessel segments are accessible. Bilateral testing is considered an integral part of a complete examination. Photoelectric Plethysmograph (PPG) waveforms and toe systolic pressure readings are included as required and additional duplex testing as needed. Limited examinations for reoccurring indications may be performed as noted.  ABI Findings: +--------+------------------+-----+----------+--------+ Right   Rt Pressure (mmHg)IndexWaveform  Comment  +--------+------------------+-----+----------+--------+ [id number]                    triphasic           +--------+------------------+-----+----------+--------+ PTA     254               1.55 monophasic         +--------+------------------+-----+----------+--------+ DP      254               1.55 monophasic         +--------+------------------+-----+----------+--------+ +--------+------------------+-----+----------+--------------+ Left    Lt Pressure (mmHg)IndexWaveform  Comment        +--------+------------------+-----+----------+--------------+ Brachial                                 Restricted arm +--------+------------------+-----+----------+--------------+ PTA     254               1.55 monophasic               +--------+------------------+-----+----------+--------------+ DP      254               1.55 monophasic               +--------+------------------+-----+----------+--------------+  Summary: Right: Resting right ankle-brachial index indicates noncompressible right lower extremity arteries. Unable to obtain TBI due to great toe ulceration. Left: Resting left ankle-brachial index indicates noncompressible left lower extremity arteries. Unable to obtain TBI due to great toe ulceration.  *See table(s) above for measurements and observations.  Electronically signed by Curt Jews MD on 08/11/2020 at 4:35:42 PM.   Final    VAS [place] LOWER EXTREMITY ARTERIAL DUPLEX  Result Date: 08/11/2020 LOWER EXTREMITY ARTERIAL DUPLEX STUDY Indications: Ulceration. High Risk Factors: Hypertension, hyperlipidemia, Diabetes.  Current ABI: Right and left PT and DP vessels are noncompressible. Limitations: Patient movement, patient anatomy Comparison Study: No prior studies. Performing Technologist: Oliver Hum RVT  Examination Guidelines: A complete evaluation includes B-mode imaging, spectral Doppler, color Doppler, and power Doppler as needed of all accessible portions of each vessel. Bilateral testing is considered an integral part of a complete examination. Limited examinations for  reoccurring indications may be performed as noted.  +-----------+--------+-----+---------------+----------+--------+ RIGHT      PSV cm/sRatioStenosis       Waveform  Comments +-----------+--------+-----+---------------+----------+--------+ CFA Distal 33                          biphasic           +-----------+--------+-----+---------------+----------+--------+ DFA        207          50-74% stenosismonophasic         +-----------+--------+-----+---------------+----------+--------+ SFA Prox   519          75-99% stenosismonophasic         +-----------+--------+-----+---------------+----------+--------+ SFA Mid    63                          monophasic         +-----------+--------+-----+---------------+----------+--------+ SFA Distal 71                          monophasic         +-----------+--------+-----+---------------+----------+--------+ POP Prox   77                          monophasic         +-----------+--------+-----+---------------+----------+--------+ POP Distal 24                          monophasic         +-----------+--------+-----+---------------+----------+--------+ ATA Prox   45                          monophasic         +-----------+--------+-----+---------------+----------+--------+ ATA Mid    18                          monophasic         +-----------+--------+-----+---------------+----------+--------+ ATA Distal 22                          monophasic         +-----------+--------+-----+---------------+----------+--------+ PTA Prox   30                          monophasic         +-----------+--------+-----+---------------+----------+--------+ PTA Mid    42                          monophasic         +-----------+--------+-----+---------------+----------+--------+ PTA Distal 53                          monophasic         +-----------+--------+-----+---------------+----------+--------+ PERO Prox   29                          monophasic         +-----------+--------+-----+---------------+----------+--------+ PERO Mid   27                          monophasic         +-----------+--------+-----+---------------+----------+--------+ PERO Distal24                          monophasic         +-----------+--------+-----+---------------+----------+--------+  DP         43                          monophasic         +-----------+--------+-----+---------------+----------+--------+  +-----------+--------+-----+--------+----------+--------+ LEFT       PSV cm/sRatioStenosisWaveform  Comments +-----------+--------+-----+--------+----------+--------+ CFA Distal 102                  triphasic          +-----------+--------+-----+--------+----------+--------+ DFA        64                   biphasic           +-----------+--------+-----+--------+----------+--------+ SFA Prox   143                  triphasic          +-----------+--------+-----+--------+----------+--------+ SFA Mid    47                   biphasic           +-----------+--------+-----+--------+----------+--------+ SFA Distal 124                  biphasic           +-----------+--------+-----+--------+----------+--------+ POP Prox   114                  triphasic          +-----------+--------+-----+--------+----------+--------+ POP Distal 43                   biphasic           +-----------+--------+-----+--------+----------+--------+ ATA Prox   22                   monophasic         +-----------+--------+-----+--------+----------+--------+ ATA Mid    23                   monophasic         +-----------+--------+-----+--------+----------+--------+ ATA Distal 24                   monophasic         +-----------+--------+-----+--------+----------+--------+ PTA Prox   73                   monophasic          +-----------+--------+-----+--------+----------+--------+ PTA Mid    80                   monophasic         +-----------+--------+-----+--------+----------+--------+ PTA Distal 92                   monophasic         +-----------+--------+-----+--------+----------+--------+ PERO Prox  9                    monophasic         +-----------+--------+-----+--------+----------+--------+ PERO Mid   21                   monophasic         +-----------+--------+-----+--------+----------+--------+ PERO Distal28                   monophasic         +-----------+--------+-----+--------+----------+--------+ DP         38  monophasic         +-----------+--------+-----+--------+----------+--------+  Summary: 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 Calcific plaque noted throughout all visualized lower extremity arteries. Left: Calcific plaque noted throughout all visualized lower extremity arteries.  See table(s) above for measurements and observations.    Preliminary      Scheduled Meds: . sodium chloride   Intravenous Once  . apixaban  5 mg Oral BID  . brimonidine  1 drop Right Eye BID  . collagenase   Topical Daily  . dorzolamide-timolol  1 drop Right Eye BID  . feeding supplement  237 mL Oral TID BM  . insulin aspart  0-9 Units Subcutaneous TID WC  . insulin glargine  8 Units Subcutaneous Daily  . multivitamin with minerals  1 tablet Oral Daily  . mycophenolate  360 mg Oral BID  . predniSONE  5 mg Oral Daily  . silver sulfADIAZINE   Topical Daily  . sodium bicarbonate  1,300 mg Oral BID  . tacrolimus  1 mg Oral BID  . tamsulosin  0.4 mg Oral BH-q7a   Continuous Infusions: . ceFEPime (MAXIPIME) IV 2 g (08/13/20 0824)     LOS: 4 days    Phillips Climes MD. on 08/13/2020 at 11:42 AM  Between 7am to 7pm - Please see pager noted on [email or web address]  After 7pm go to  [email or web address]  And look for the night coverage person covering for me after hours  [facility] Office  [phone]

## 2020-08-14 ENCOUNTER — Encounter (HOSPITAL_COMMUNITY)
Admission: EM | Disposition: A | Discharge status: Home Health | Payer: Self-pay | Source: Home / Self Care | Attending: Internal Medicine

## 2020-08-14 LAB — GLUCOSE, CAPILLARY
Glucose-Capillary: 96 mg/dL (ref 70–99)
Glucose-Capillary: 133 mg/dL — ABNORMAL HIGH (ref 70–99)
Glucose-Capillary: 91 mg/dL (ref 70–99)
Glucose-Capillary: 70 mg/dL (ref 70–99)
Glucose-Capillary: 98 mg/dL (ref 70–99)

## 2020-08-14 LAB — COMPREHENSIVE METABOLIC PANEL
ALT: 78 U/L — ABNORMAL HIGH (ref 0–44)
AST: 27 U/L (ref 15–41)
Albumin: 1.7 g/dL — ABNORMAL LOW (ref 3.5–5.0)
Alkaline Phosphatase: 255 U/L — ABNORMAL HIGH (ref 38–126)
Anion gap: 5 (ref 5–15)
BUN: 18 mg/dL (ref 8–23)
CO2: 21 mmol/L — ABNORMAL LOW (ref 22–32)
Calcium: 10.7 mg/dL — ABNORMAL HIGH (ref 8.9–10.3)
Chloride: 111 mmol/L (ref 98–111)
Creatinine, Ser: 0.78 mg/dL (ref 0.61–1.24)
GFR, Estimated: >60 mL/min (ref >60)
Glucose, Bld: 92 mg/dL (ref 70–99)
Potassium: 4 mmol/L (ref 3.5–5.1)
Sodium: 137 mmol/L (ref 135–145)
Total Bilirubin: 0.8 mg/dL (ref 0.3–1.2)
Total Protein: 5.6 g/dL — ABNORMAL LOW (ref 6.5–8.1)

## 2020-08-14 LAB — CBC
HCT: 32 % — ABNORMAL LOW (ref 39.0–52.0)
Hemoglobin: 9.8 g/dL — ABNORMAL LOW (ref 13.0–17.0)
MCH: 26.3 pg (ref 26.0–34.0)
MCHC: 30.6 g/dL (ref 30.0–36.0)
MCV: 85.8 fL (ref 80.0–100.0)
Platelets: 128 10*3/uL — ABNORMAL LOW (ref 150–400)
RBC: 3.73 MIL/uL — ABNORMAL LOW (ref 4.22–5.81)
RDW: 19.5 % — ABNORMAL HIGH (ref 11.5–15.5)
WBC: 4.9 10*3/uL (ref 4.0–10.5)
nRBC: 0 % (ref 0.0–0.2)

## 2020-08-14 LAB — CULTURE, BLOOD (ROUTINE X 2)
Culture: NO GROWTH
Special Requests: ADEQUATE

## 2020-08-14 LAB — PHOSPHORUS: Phosphorus: 1.7 mg/dL — ABNORMAL LOW (ref 2.5–4.6)

## 2020-08-14 SURGERY — ABDOMINAL AORTOGRAM W/LOWER EXTREMITY
Anesthesia: LOCAL

## 2020-08-14 MED ORDER — MIRTAZAPINE 15 MG PO TBDP
15.0000 mg | ORAL_TABLET | Freq: Every day | ORAL | Status: DC
  Administered 2020-08-14: 15 mg via ORAL
  Filled 2020-08-14 (×2): qty 1

## 2020-08-14 MED ORDER — METOPROLOL TARTRATE 12.5 MG HALF TABLET
12.5000 mg | ORAL_TABLET | Freq: Two times a day (BID) | ORAL | Status: DC
  Administered 2020-08-14 – 2020-08-15 (×3): 12.5 mg via ORAL
  Filled 2020-08-14 (×3): qty 1

## 2020-08-14 MED ORDER — K PHOS MONO-SOD PHOS DI & MONO 155-852-130 MG PO TABS
500.0000 mg | ORAL_TABLET | Freq: Three times a day (TID) | ORAL | Status: DC
  Administered 2020-08-14 – 2020-08-15 (×4): 500 mg via ORAL
  Filled 2020-08-14 (×4): qty 2

## 2020-08-14 NOTE — Progress Notes (Addendum)
PROGRESS NOTE    Ryan Rice  KGM:[id number] DOB: Jul 31, 1951 DOA: 08/09/2020 PCP: Mina Marble, PA-C   Brief Narrative:  HPI on 08/09/2020 by Dr. Tennis Must Ryan Rice is a 69 y.o. male with medical history significant of allergic rhinitis, BPH, glaucoma, type 2 diabetes, hyperlipidemia, history of DVT, hypertension, chronic atrial fibrillation, history of embolic CVA, history of hypomagnesemia, kidney transplant recipient, peripheral vascular disease who was brought to the emergency department with complaints of altered mental status and fever.  His wife states that earlier in the day he was having abdominal pain, felt constipated and she gave him a stool softeners without significant relief.  Later the patient started feeling cold and shivering.  This was followed by decreased mentation.  His mental status has improved and he is able to answer some questions.  He had abdominal pain earlier in the week and felt nauseous, but this was self-limited.  He has been having RUQ pain for some time now, but does not know how long.  Interim history Patient admitted with AMS. Found to have possible pneumonia and bacteremia.  Patient with elevated LFTs with cholelithiasis however HIDA scan indicating chronic cholecystitis.  Also found to have bacteremia, currently on IV antibiotics.  Pending ABI.  General surgery, Ortho and vascular surgery consulted.  Subjective:   Ryan Rice seen and examined today.  Denies Chest pain, shortness of breath, fever or chills, he remains with poor appetite as discussed with staff .    Assessment & Plan   Acute metabolic encepalopathy -This has improved, he is awake alert x2, more conversant, and answering most of the questions appropriately . -per wife, patient has had memory issues that is intermittent since his previous stroke -Suspect secondary to the infection -Ammonia level 24 -Continue to monitor  Pseudomonas bacteremia/Possible due to  community-acquired pneumonia -Patient was admitted with sepsis secondary to unknown source however patient did not have sirs criteria only elevated lactic acid level of 2.2.  As of now, sepsis ruled out-however patient is immune compromised -Chest x-ray: Reviewed appears patient may have a left infiltrate -Initially placed on vancomycin and Zosyn -Vancomycin discontinued due to negative MRSA -Blood cultures 1/2 aerobic bottle showing gram-negative rods, Pseudomonas aeruginosa-sensitivities show resistance to Zosyn, transitioned to cefepime, discussed with ID, will need total of 10 days treatment, can be transitioned to Cipro on discharge. -Covid and influenza negative  Elevated LFTs/cholelithiasis, possible cholecystitis -AST 685, ALT 455, alk phos 517 on admission- LFTs continue to trend downward -Abdominal ultrasound obtained showing cholelithiasis with borderline gallbladder wall thickening and small amount of pericholecystic fluid -HIDA scan negative for acute cholecystitis, indicative of likely chronic cholecystitis -General surgery consulted and appreciated, no indication for emergent or urgent cholecystostomy at this time, patient may follow-up with [facility] in the clinic  Multiple pressure skin ulcers/severe PVD -Present on admission -Wound care consulted -Do not appear to be infected -Patient has been seeing a podiatrist associated with [facility]. -Vascular surgery orthopedic consult greatly appreciated, patient is very poor candidate for revascularization option, current plan to continue with local wound care, at one point likely he will need 1 or bilateral amputation once wound ulcers or he develop OM/infected wound .  Hypokalemia -Resolved with replacement, continue to monitor and replace as needed  Hypophosphatemia -Resolved with replacement, continue to monitor BMP  Kidney transplant -Currently on prednisone -Tacrolimus and mycophenolate will be resumed   Diabetes mellitus, type  II -Placed on carb modified diet -Lantus initially held as patient was n.p.o. -Will restart  Lantus today at reduced dose, 8 units -Continue insulin sliding scale CBG monitoring  Glaucoma -Continue eyedrops  Atrial fibrillation, chronic -CHA2DS2-VASc score at least 6 -Metoprolol held as patient is currently bradycardic -Currently no procedures anticipated, so will transition from heparin GTT to Eliquis.  Essential hypertension -Medications currently held, continue to monitor BP and treat accordingly  Hyperlipidemia -Statin currently held due to elevated LFTs  BPH -Continue Flomax, Proscar  Microcytic anemia/anemia of chronic illness -chronic, around baseline, he received units PRBC transfusion with good response -hemoccult if negative.  protein calorie malnutrition/failure to thrive -He remains with very poor appetite, will start him on Remeron  Hypophosphatemia -Repleting with Neutra-Phos  Pressure ulcers -Continue with wound care Pressure Injury 08/09/20 Buttocks Right Stage 2 -  Partial thickness loss of dermis presenting as a shallow open injury with a red, pink wound bed without slough. (Active)  08/09/20 2130  Location: Buttocks  Location Orientation: Right  Staging: Stage 2 -  Partial thickness loss of dermis presenting as a shallow open injury with a red, pink wound bed without slough.  Wound Description (Comments):   Present on Admission: Yes     Pressure Injury 08/09/20 Heel Right Unstageable - Full thickness tissue loss in which the base of the injury is covered by slough (yellow, tan, gray, green or brown) and/or eschar (tan, brown or black) in the wound bed. (Active)  08/09/20 2130  Location: Heel  Location Orientation: Right  Staging: Unstageable - Full thickness tissue loss in which the base of the injury is covered by slough (yellow, tan, gray, green or brown) and/or eschar (tan, brown or black) in the wound bed.  Wound Description (Comments):   Present  on Admission: Yes     Pressure Injury 08/09/20 Heel Left Unstageable - Full thickness tissue loss in which the base of the injury is covered by slough (yellow, tan, gray, green or brown) and/or eschar (tan, brown or black) in the wound bed. (Active)  08/09/20 2130  Location: Heel  Location Orientation: Left  Staging: Unstageable - Full thickness tissue loss in which the base of the injury is covered by slough (yellow, tan, gray, green or brown) and/or eschar (tan, brown or black) in the wound bed.  Wound Description (Comments):   Present on Admission: Yes     Pressure Injury 08/09/20 Foot Lateral;Left Unstageable - Full thickness tissue loss in which the base of the injury is covered by slough (yellow, tan, gray, green or brown) and/or eschar (tan, brown or black) in the wound bed. (Active)  08/09/20 2130  Location: Foot  Location Orientation: Lateral;Left  Staging: Unstageable - Full thickness tissue loss in which the base of the injury is covered by slough (yellow, tan, gray, green or brown) and/or eschar (tan, brown or black) in the wound bed.  Wound Description (Comments):   Present on Admission: Yes     Pressure Injury 08/09/20 Foot Left;Lateral Unstageable - Full thickness tissue loss in which the base of the injury is covered by slough (yellow, tan, gray, green or brown) and/or eschar (tan, brown or black) in the wound bed. (Active)  08/09/20 2130  Location: Foot  Location Orientation: Left;Lateral  Staging: Unstageable - Full thickness tissue loss in which the base of the injury is covered by slough (yellow, tan, gray, green or brown) and/or eschar (tan, brown or black) in the wound bed.  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    DVT Prophylaxis  Eliquis --> Heparin  Code Status: Full  Family Communication: none at bedside.  Discussed with wife by phone  08/13/2020. 08/14/2020  Disposition Plan:  Status is: Inpatient  Remains inpatient appropriate because:Altered mental status, Ongoing diagnostic testing needed not appropriate for outpatient work up, IV treatments appropriate due to intensity of illness or inability to take PO and Inpatient level of care appropriate due to severity of illness   Dispo: The patient is from: Home              Anticipated d/c is to: TBD              Anticipated d/c date is: 1 day              Patient currently is not medically stable to d/c.   Consultants General surgery  Procedures  Abdominal ultrasound  Antibiotics   Anti-infectives (From admission, onward)   Start     Dose/Rate Route Frequency Ordered Stop   08/11/20 1800  ceFEPIme (MAXIPIME) 2 g in sodium chloride 0.9 % 100 mL IVPB        2 g 200 mL/hr over 30 Minutes Intravenous Every 8 hours 08/11/20 0956     08/11/20 1030  ceFEPIme (MAXIPIME) 2 g in sodium chloride 0.9 % 100 mL IVPB        2 g 200 mL/hr over 30 Minutes Intravenous NOW 08/11/20 0956 08/11/20 1117   08/10/20 0600  vancomycin (VANCOCIN) IVPB 1000 mg/200 mL premix  Status:  Discontinued        1,000 mg 200 mL/hr over 60 Minutes Intravenous Every 24 hours 08/09/20 0516 08/09/20 1208   08/09/20 2200  ceFEPIme (MAXIPIME) 2 g in sodium chloride 0.9 % 100 mL IVPB  Status:  Discontinued        2 g 200 mL/hr over 30 Minutes Intravenous Every 12 hours 08/09/20 1209 08/09/20 1357   08/09/20 2200  piperacillin-tazobactam (ZOSYN) IVPB 3.375 g  Status:  Discontinued        3.375 g 12.5 mL/hr over 240 Minutes Intravenous Every 8 hours 08/09/20 1357 08/11/20 0956   08/09/20 1208  vancomycin variable dose per unstable renal function (pharmacist dosing)  Status:  Discontinued         Does not apply See admin instructions 08/09/20 1208 08/10/20 0949   08/09/20 1000  ceFEPIme (MAXIPIME) 2 g in sodium chloride 0.9 % 100 mL IVPB  Status:  Discontinued        2 g 200 mL/hr over 30 Minutes  Intravenous Every 8 hours 08/09/20 0514 08/09/20 1209   08/09/20 0600  Ampicillin-Sulbactam (UNASYN) 3 g in sodium chloride 0.9 % 100 mL IVPB  Status:  Discontinued       Note to Pharmacy: Adjust antibiotics as needed in the absence of metronidazole. TY.   3 g 200 mL/hr over 30 Minutes Intravenous Every 6 hours 08/09/20 0502 08/09/20 1357   08/09/20 0515  ceFEPIme (MAXIPIME) 2 g in sodium chloride 0.9 % 100 mL IVPB  Status:  Discontinued        2 g 200 mL/hr over 30 Minutes Intravenous  Once 08/09/20 0502 08/09/20 0514   08/09/20 0515  vancomycin (VANCOREADY) IVPB 1500 mg/300 mL        1,500 mg 150 mL/hr over 120 Minutes Intravenous  Once 08/09/20 0502 08/09/20 0803   08/09/20 0115  ceFEPIme (MAXIPIME) 2 g in sodium chloride 0.9 % 100 mL IVPB  2 g 200 mL/hr over 30 Minutes Intravenous  Once 08/09/20 0105 08/09/20 0229   08/09/20 0115  metroNIDAZOLE (FLAGYL) IVPB 500 mg  Status:  Discontinued        500 mg 100 mL/hr over 60 Minutes Intravenous Every 8 hours 08/09/20 0105 08/09/20 0511   08/09/20 0115  vancomycin (VANCOCIN) IVPB 1000 mg/200 mL premix  Status:  Discontinued        1,000 mg 200 mL/hr over 60 Minutes Intravenous  Once 08/09/20 0105 08/09/20 0108   08/09/20 0115  vancomycin (VANCOREADY) IVPB 1500 mg/300 mL        1,500 mg 150 mL/hr over 120 Minutes Intravenous  Once 08/09/20 0108 08/09/20 0552       Objective:   Vitals:   08/13/20 2300 08/14/20 0400 08/14/20 0500 08/14/20 0745  BP: (!) 151/87  (!) 148/96 (!) 154/76  Pulse: 65  89 81  Resp: _0 Temp: (!) 97.1 F (36.2 C)  (!) 97.1 F (36.2 C) 98.7 F (37.1 C)  TempSrc: Oral  Oral Oral  SpO2: 100%  100% 99%  Weight:      Height:        Intake/Output Summary (Last 24 hours) at 08/14/2020 1244 Last data filed at 08/14/2020 0200 Gross per 24 hour  Intake 1033.4 ml  Output 500 ml  Net 533.4 ml   Filed Weights   08/09/20 0100  Weight: 72.6 kg   Exam    Awake Alert, does not, communicative,  easily distracted, chronically frail and ill-appearing, severely deconditioned  symmetrical Chest wall movement, Good air movement bilaterally, CTAB RRR,No Gallops,Rubs or new Murmurs, No Parasternal Heave +ve B.Sounds, Abd Soft, No tenderness, No rebound - guarding or rigidity. Patient with chronic lower extremity wounds, and sacral pressure ulcer     Data Reviewed: I have personally reviewed following labs and imaging studies  CBC: Recent Labs  Lab 08/09/20 0105 08/09/20 0105 08/10/20 0045 08/10/20 0045 08/11/20 0230 08/11/20 1644 08/12/20 0558 08/13/20 0110 08/14/20 0052  WBC 9.8   < > 6.2  --  3.3*  --  3.7* 4.5 4.9  NEUTROABS 8.9*  --  5.7  --   --   --   --   --   --   HGB 7.6*   < > 7.8*   < > 7.1* 6.8* 7.9* 9.1* 9.8*  HCT 26.4*   < > 26.0*   < > 23.7* 23.3* 27.1* 29.4* 32.0*  MCV 87.4   < > 85.0  --  85.6  --  86.6 85.0 85.8  PLT 164   < > 96*  --  135*  --  134* 124* 128*   < > = values in this interval not displayed.   Basic Metabolic Panel: Recent Labs  Lab 08/09/20 0105 08/09/20 0520 08/10/20 0045 08/11/20 0230 08/12/20 0558 08/13/20 0110 08/14/20 0052  NA   < >  --  137 139 138 137 137  K   < >  --  4.9 3.9 4.3 4.0 4.0  CL   < >  --  111 109 112* 112* 111  CO2   < >  --  18* 22 19* 19* 21*  GLUCOSE   < >  --  141* 297* 235* 154* 92  BUN   < >  --  39* 31* 25* 22 18  CREATININE   < >  --  1.05 1.11 0.95 0.85 0.78  CALCIUM   < >  --  10.0  10.0 10.3 10.4* 10.7*  MG  --  1.7  --   --   --   --   --   PHOS  --  2.0*  --   --   --   --  1.7*   < > = values in this interval not displayed.   GFR: Estimated Creatinine Clearance: 88.2 mL/min (by C-G formula based on SCr of 0.78 mg/dL). Liver Function Tests: Recent Labs  Lab 08/10/20 0045 08/11/20 0230 08/12/20 0558 08/13/20 0110 08/14/20 0052  AST 213* 81* 39 30 27  ALT 285* 180* 125* 95* 78*  ALKPHOS 418* 317* 276* 250* 255*  BILITOT 2.6* 1.0 1.0 0.9 0.8  PROT 5.6* 5.5* 5.5* 5.2* 5.6*  ALBUMIN  1.9* 1.7* 1.8* 1.6* 1.7*   No results for input(s): LIPASE, AMYLASE in the last 168 hours. Recent Labs  Lab 08/09/20 0608  AMMONIA 24   Coagulation Profile: Recent Labs  Lab 08/09/20 0105 08/10/20 0045 08/12/20 0558 08/13/20 0110  INR 2.2* 2.2* 1.2 1.3*   Cardiac Enzymes: No results for input(s): CKTOTAL, CKMB, CKMBINDEX, TROPONINI in the last 168 hours. BNP (last 3 results) No results for input(s): PROBNP in the last 8760 hours. HbA1C: No results for input(s): HGBA1C in the last 72 hours. CBG: Recent Labs  Lab 08/13/20 2127 08/13/20 2216 08/14/20 0223 08/14/20 0725 08/14/20 1151  GLUCAP 67* 79 96 133* 91   Lipid Profile: No results for input(s): CHOL, HDL, LDLCALC, TRIG, CHOLHDL, LDLDIRECT in the last 72 hours. Thyroid Function Tests: No results for input(s): TSH, T4TOTAL, FREET4, T3FREE, THYROIDAB in the last 72 hours. Anemia Panel: Recent Labs    08/12/20 0558  VITAMINB12 885  FOLATE 18.5  FERRITIN 1,407*  TIBC 113*  IRON 104  RETICCTPCT 2.1   Urine analysis:    Component Value Date/Time   COLORURINE YELLOW 08/11/2020 [place] 08/11/2020 0439   LABSPEC 1.016 08/11/2020 0439   PHURINE 8.0 08/11/2020 0439   GLUCOSEU >=500 (A) 08/11/2020 0439   HGBUR MODERATE (A) 08/11/2020 0439   BILIRUBINUR NEGATIVE 08/11/2020 [place] 08/11/2020 0439   PROTEINUR 30 (A) 08/11/2020 0439   NITRITE NEGATIVE 08/11/2020 0439   LEUKOCYTESUR NEGATIVE 08/11/2020 0439   Sepsis Labs: _0 (procalcitonin:4,lacticidven:4)  ) Recent Results (from the past 240 hour(s))  Culture, blood (x 2)     Status: Abnormal   Collection Time: 08/09/20  1:47 AM   Specimen: BLOOD  Result Value Ref Range Status   Specimen Description BLOOD LEFT ANTECUBITAL  Final   Special Requests   Final    BOTTLES DRAWN AEROBIC AND ANAEROBIC Blood Culture adequate volume   Culture  Setup Time   Final    GRAM NEGATIVE RODS AEROBIC BOTTLE ONLY Organism ID to  follow CRITICAL RESULT CALLED TO, READ BACK BY AND VERIFIED WITH: PHRMD L SEAY _1  08/10/20 BY S GEZAHEGN Performed at [facility], [place] [address] [place], [place] [address]    Culture PSEUDOMONAS AERUGINOSA (A)  Final   Report Status 08/11/2020 FINAL  Final   Organism ID, Bacteria PSEUDOMONAS AERUGINOSA  Final      Susceptibility   Pseudomonas aeruginosa - MIC*    CEFTAZIDIME 32 RESISTANT Resistant     CIPROFLOXACIN 1 SENSITIVE Sensitive     GENTAMICIN <=1 SENSITIVE Sensitive     IMIPENEM >=16 RESISTANT Resistant     PIP/TAZO >=128 RESISTANT Resistant     CEFEPIME 8 SENSITIVE Sensitive     * PSEUDOMONAS AERUGINOSA  Blood Culture ID Panel (Reflexed)     Status: Abnormal   Collection Time: 08/09/20  1:47 AM  Result Value Ref Range Status   Enterococcus faecalis NOT DETECTED NOT DETECTED Final   Enterococcus Faecium NOT DETECTED NOT DETECTED Final   Listeria monocytogenes NOT DETECTED NOT DETECTED Final   Staphylococcus species NOT DETECTED NOT DETECTED Final   Staphylococcus aureus (BCID) NOT DETECTED NOT DETECTED Final   Staphylococcus epidermidis NOT DETECTED NOT DETECTED Final   Staphylococcus lugdunensis NOT DETECTED NOT DETECTED Final   Streptococcus species NOT DETECTED NOT DETECTED Final   Streptococcus agalactiae NOT DETECTED NOT DETECTED Final   Streptococcus pneumoniae NOT DETECTED NOT DETECTED Final   Streptococcus pyogenes NOT DETECTED NOT DETECTED Final   A.calcoaceticus-baumannii NOT DETECTED NOT DETECTED Final   Bacteroides fragilis NOT DETECTED NOT DETECTED Final   Enterobacterales NOT DETECTED NOT DETECTED Final   Enterobacter cloacae complex NOT DETECTED NOT DETECTED Final   Escherichia coli NOT DETECTED NOT DETECTED Final   Klebsiella aerogenes NOT DETECTED NOT DETECTED Final   Klebsiella oxytoca NOT DETECTED NOT DETECTED Final   Klebsiella pneumoniae NOT DETECTED NOT DETECTED Final   Proteus species NOT DETECTED NOT DETECTED Final   Salmonella  species NOT DETECTED NOT DETECTED Final   Serratia marcescens NOT DETECTED NOT DETECTED Final   Haemophilus influenzae NOT DETECTED NOT DETECTED Final   Neisseria meningitidis NOT DETECTED NOT DETECTED Final   Pseudomonas aeruginosa DETECTED (A) NOT DETECTED Final    Comment: CRITICAL RESULT CALLED TO, READ BACK BY AND VERIFIED WITH: PHRMD L SEAY _0  08/10/20 BY S GEZAHEGN    Stenotrophomonas maltophilia NOT DETECTED NOT DETECTED Final   Candida albicans NOT DETECTED NOT DETECTED Final   Candida auris NOT DETECTED NOT DETECTED Final   Candida glabrata NOT DETECTED NOT DETECTED Final   Candida krusei NOT DETECTED NOT DETECTED Final   Candida parapsilosis NOT DETECTED NOT DETECTED Final   Candida tropicalis NOT DETECTED NOT DETECTED Final   Cryptococcus neoformans/gattii NOT DETECTED NOT DETECTED Final   CTX-M ESBL NOT DETECTED NOT DETECTED Final   Carbapenem resistance IMP NOT DETECTED NOT DETECTED Final   Carbapenem resistance KPC NOT DETECTED NOT DETECTED Final   Carbapenem resistance NDM NOT DETECTED NOT DETECTED Final   Carbapenem resistance VIM NOT DETECTED NOT DETECTED Final    Comment: Performed at [facility], [address], [place], [place] [address]  Culture, blood (x 2)     Status: None (Preliminary result)   Collection Time: 08/09/20  2:21 AM   Specimen: BLOOD RIGHT HAND  Result Value Ref Range Status   Specimen Description BLOOD RIGHT HAND  Final   Special Requests   Final    BOTTLES DRAWN AEROBIC AND ANAEROBIC Blood Culture adequate volume   Culture   Final    NO GROWTH 4 DAYS Performed at [facility], [place] [address] [place], [place] [address]    Report Status PENDING  Incomplete  Respiratory Panel by RT PCR (Flu A&B, Covid) - Nasopharyngeal Swab     Status: None   Collection Time: 08/09/20  7:14 AM   Specimen: Nasopharyngeal Swab  Result Value Ref Range Status   SARS Coronavirus 2 by RT PCR NEGATIVE NEGATIVE Final    Comment: (NOTE) SARS-CoV-2  target nucleic acids are NOT DETECTED.  The SARS-CoV-2 RNA is generally detectable in upper respiratoy specimens during the acute phase of infection. The lowest concentration of SARS-CoV-2 viral copies this assay can detect is 131 copies/mL. A  negative result does not preclude SARS-Cov-2 infection and should not be used as the sole basis for treatment or other patient management decisions. A negative result may occur with  improper specimen collection/handling, submission of specimen other than nasopharyngeal swab, presence of viral mutation(s) within the areas targeted by this assay, and inadequate number of viral copies (<131 copies/mL). A negative result must be combined with clinical observations, patient history, and epidemiological information. The expected result is Negative.  Fact Sheet for Patients:  PinkCheek.be  Fact Sheet for Healthcare Providers:  GravelBags.it  This test is no t yet approved or cleared by the [place] FDA and  has been authorized for detection and/or diagnosis of SARS-CoV-2 by FDA under an Emergency Use Authorization (EUA). This EUA will remain  in effect (meaning this test can be used) for the duration of the COVID-19 declaration under Section 564(b)(1) of the Act, 21 U.S.C. section 360bbb-3(b)(1), unless the authorization is terminated or revoked sooner.     Influenza A by PCR NEGATIVE NEGATIVE Final   Influenza B by PCR NEGATIVE NEGATIVE Final    Comment: (NOTE) The Xpert Xpress SARS-CoV-2/FLU/RSV assay is intended as an aid in  the diagnosis of influenza from Nasopharyngeal swab specimens and  should not be used as a sole basis for treatment. Nasal washings and  aspirates are unacceptable for Xpert Xpress SARS-CoV-2/FLU/RSV  testing.  Fact Sheet for Patients: PinkCheek.be  Fact Sheet for Healthcare  Providers: GravelBags.it  This test is not yet approved or cleared by the [place] FDA and  has been authorized for detection and/or diagnosis of SARS-CoV-2 by  FDA under an Emergency Use Authorization (EUA). This EUA will remain  in effect (meaning this test can be used) for the duration of the  Covid-19 declaration under Section 564(b)(1) of the Act, 21  U.S.C. section 360bbb-3(b)(1), unless the authorization is  terminated or revoked. Performed at [facility], [place] [address], [place], [place] [address]   MRSA PCR Screening     Status: None   Collection Time: 08/09/20  8:23 PM   Specimen: Nasal Mucosa; Nasopharyngeal  Result Value Ref Range Status   MRSA by PCR NEGATIVE NEGATIVE Final    Comment:        The GeneXpert MRSA Assay (FDA approved for NASAL specimens only), is one component of a comprehensive MRSA colonization surveillance program. It is not intended to diagnose MRSA infection nor to guide or monitor treatment for MRSA infections. Performed at [facility], [place] [address] [place], [place] [address]   Culture, blood (routine x 2)     Status: None (Preliminary result)   Collection Time: 08/11/20  6:48 AM   Specimen: BLOOD RIGHT HAND  Result Value Ref Range Status   Specimen Description BLOOD RIGHT HAND  Final   Special Requests   Final    BOTTLES DRAWN AEROBIC ONLY Blood Culture adequate volume   Culture   Final    NO GROWTH 2 DAYS Performed at [facility], [place] [address] [place], [place] [address]    Report Status PENDING  Incomplete  Culture, blood (routine x 2)     Status: None (Preliminary result)   Collection Time: 08/11/20  6:49 AM   Specimen: BLOOD RIGHT WRIST  Result Value Ref Range Status   Specimen Description BLOOD RIGHT WRIST  Final   Special Requests   Final    BOTTLES DRAWN AEROBIC ONLY Blood Culture adequate volume   Culture   Final  NO GROWTH 2 DAYS Performed at [place], [place] [address] [place], [place] [address]    Report Status PENDING  Incomplete      Radiology Studies: No results found.   Scheduled Meds: . sodium chloride   Intravenous Once  . apixaban  5 mg Oral BID  . brimonidine  1 drop Right Eye BID  . collagenase   Topical Daily  . dorzolamide-timolol  1 drop Right Eye BID  . feeding supplement  237 mL Oral TID BM  . insulin aspart  0-9 Units Subcutaneous TID WC  . insulin glargine  8 Units Subcutaneous Daily  . metoprolol tartrate  12.5 mg Oral BID  . multivitamin with minerals  1 tablet Oral Daily  . mycophenolate  360 mg Oral BID  . pantoprazole  40 mg Oral Daily  . phosphorus  500 mg Oral TID  . predniSONE  5 mg Oral Daily  . silver sulfADIAZINE   Topical Daily  . sodium bicarbonate  1,300 mg Oral BID  . tacrolimus  1 mg Oral BID  . tamsulosin  0.4 mg Oral [id number]   Continuous Infusions: . ceFEPime (MAXIPIME) IV 2 g (08/14/20 0131)     LOS: 5 days    Phillips Climes MD. on 08/14/2020 at 12:44 PM  Between 7am to 7pm - Please see pager noted on [email or web address]  After 7pm go to [email or web address]  And look for the night coverage person covering for me after hours  [facility] Office  [phone]

## 2020-08-14 NOTE — Plan of Care (Signed)

## 2020-08-14 NOTE — Evaluation (Signed)
Occupational Therapy Evaluation Patient Details Name: Ryan Rice MRN: [id number] DOB: 12-Mar-1951 Today's Date: 08/14/2020    History of Present Illness 69 yo admitted with AMS, fever, bacteremia of unknowns source with possible PNA. 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 Pt's wife does the cooking, cleaning and driving. Pt has a hoyer lift and is assisted with lift by wife and caregiver. Pt currently requires modA-maxA for bed mobility. He demonstrates decreased activity tolerance, generalized weakness, and cognitive limitations impacting safety and independence with ADL/IADL and functional mobility. Pt having difficulty with self-feeding this date, he reports this is typically an independent activity for him. Due to decline in current level of function, pt would benefit from acute OT to address established goals to facilitate safe D/C to venue listed below. Per chart, family does not want pt to d/c to SNF. At this time, recommend HHOT follow-up with 24/7 physical assistance. Will continue to follow acutely.     Follow Up Recommendations  Home health OT;Supervision/Assistance - 24 hour (recommend d/c to SNF, per chart family pref d/c home)    Equipment Recommendations  None recommended by OT    Recommendations for Other Services       Precautions / Restrictions Restrictions Weight Bearing Restrictions: No      Mobility Bed Mobility Overal bed mobility: Needs Assistance Bed Mobility: Supine to Sit;Sit to Supine     Supine to sit: Mod assist Sit to supine: Max assist   General bed mobility comments: modA for BLE management, cues for sequencing and assistance to powerup into sitting upright    Transfers                 General transfer comment: not attempted    Balance  Overall balance assessment: Needs assistance Sitting-balance support: Bilateral upper extremity supported;Feet supported Sitting balance-Leahy Scale: Poor Sitting balance - Comments: mostly minguard for sitting balance with occassional minA with posterior lean Postural control: Posterior lean     Standing balance comment: standing not attempted                           ADL either performed or assessed with clinical judgement   ADL Overall ADL's : Needs assistance/impaired Eating/Feeding: Minimal assistance;Sitting Eating/Feeding Details (indicate cue type and reason): minA to eat lunch, pt with decreased activity tolerance, endorses that this is different from baseline Grooming: Moderate assistance;Sitting   Upper Body Bathing: Maximal assistance   Lower Body Bathing: Total assistance   Upper Body Dressing : Maximal assistance   Lower Body Dressing: Total assistance     Toilet Transfer Details (indicate cue type and reason): deferred   Toileting - Clothing Manipulation Details (indicate cue type and reason): deferred     Functional mobility during ADLs: Moderate assistance;+2 for physical assistance;+2 for safety/equipment General ADL Comments: Pt limited to sitting EOB with min guard, pt with decreased activity tolerance, decreased endurance, strength limitations and cognitive limitations     Vision         Perception     Praxis      Pertinent Vitals/Pain Pain Assessment: Faces Faces Pain Scale: Hurts a [place] bit Pain Location: bilateral feet Pain Descriptors / Indicators: Discomfort;Grimacing;Guarding Pain Intervention(s): Limited activity within patient's tolerance;Monitored during session     Hand  Dominance Right   Extremity/Trunk Assessment Upper Extremity Assessment Upper Extremity Assessment: Generalized weakness   Lower Extremity Assessment Lower Extremity Assessment: Defer to PT evaluation   Cervical / Trunk Assessment Cervical /  Trunk Assessment: Kyphotic   Communication Communication Communication: Expressive difficulties   Cognition Arousal/Alertness: Lethargic Behavior During Therapy:  (confused) Overall Cognitive Status: No family/caregiver present to determine baseline cognitive functioning Area of Impairment: Orientation;Attention;Following commands;Awareness                 Orientation Level: Disoriented to;Place;Time;Situation (unable to say birthday) Current Attention Level: Focused   Following Commands: Follows one step commands inconsistently;Follows one step commands with increased time   Awareness: Intellectual   General Comments: difficult to assess due to expressive difficulties    General Comments  pt in afib during session, HR 80s during session    Exercises Exercises: Other exercises Other Exercises Other Exercises: seated scapular retraction with head in neutral position x10 for 5 second hold   Shoulder Instructions      Home Living Family/patient expects to be discharged to:: Private residence Living Arrangements: Spouse/significant other Available Help at Discharge: Family;Available 24 hours/day Type of Home: House Home Access: Stairs to enter [employer] of Steps: 1 Entrance Stairs-Rails: Right Home Layout: One level     Bathroom Shower/Tub: Teacher, early years/pre: Standard Bathroom Accessibility: Yes   Home Equipment: Shower seat;Shower seat - built in;Bedside commode;Hospital bed;Wheelchair - Education administrator (comment)   Additional Comments: hoyer lift with assist of wife and caregiver      Prior Functioning/Environment Level of Independence: Needs assistance  Gait / Transfers Assistance Needed: bed bound at home receiving home services     Comments: wife does driving, cleaning, cooking        OT Problem List: Decreased strength;Decreased range of motion;Decreased activity tolerance;Impaired balance (sitting and/or standing);Decreased  cognition;Decreased safety awareness;Decreased knowledge of use of DME or AE;Cardiopulmonary status limiting activity;Impaired UE functional use;Pain      OT Treatment/Interventions: Self-care/ADL training;Therapeutic exercise;Energy conservation;DME and/or AE instruction;Therapeutic activities;Cognitive remediation/compensation;Patient/family education;Balance training    OT Goals(Current goals can be found in the care plan section) Acute Rehab OT Goals Patient Stated Goal: to go home OT Goal Formulation: With patient Time For Goal Achievement: 08/28/20 Potential to Achieve Goals: Fair ADL Goals Pt Will Perform Eating: with set-up Pt Will Perform Grooming: with set-up Additional ADL Goal #1: Pt will complete bed mobility with minimal assistance in preparation for ADL.  OT Frequency: Min 2X/week   Barriers to D/C:            Co-evaluation              AM-PAC OT "6 Clicks" Daily Activity     Outcome Measure Help from another person eating meals?: A Shock Help from another person taking care of personal grooming?: A Lot Help from another person toileting, which includes using toliet, bedpan, or urinal?: A Lot Help from another person bathing (including washing, rinsing, drying)?: Total Help from another person to put on and taking off regular upper body clothing?: A Lot Help from another person to put on and taking off regular lower body clothing?: Total 6 Click Score: 11   End of Session Nurse Communication: Mobility status  Activity Tolerance: Patient tolerated treatment well Patient left: in bed;with call bell/phone within reach;with bed alarm set  OT Visit Diagnosis: Unsteadiness on feet (R26.81);Other abnormalities of gait and mobility (R26.89);Muscle weakness (generalized) (M62.81);Other symptoms and signs involving cognitive function;Pain Pain - Right/Left:  (  bilateral feet)                Time: 1336-1400 OT Time Calculation (min): 24 min Charges:  OT General  Charges $OT Visit: 1 Visit OT Evaluation $OT Eval Moderate Complexity: 1 Mod OT Treatments $Self Care/Home Management : 8-22 mins  Helene Kelp OTR/L Acute Rehabilitation Services Office: [place] 08/14/2020, 3:10 PM

## 2020-08-14 NOTE — TOC Initial Note (Signed)
Transition of Care [facility]) - Initial/Assessment Note    Patient Details  Name: Ryan Rice MRN: [id number] Date of Birth: Jul 05, 1951  [facility]) CM/SW Contact:    Joanne Chars, LCSW Phone Number: 08/14/2020, 10:33 AM  Clinical Narrative:   CSW attempted to meet with pt but pt unable to participate.      CSW spoke with pt wife [name] who declines recommendation for SNF, reports [facility] is currently active in the home and she would like to continue with HH.  She also reports she is home 24/7 to be with pt and has additional family to help with supervision if needed.  PCP in place.  Pt is vaccinated for covid, received booster yesterday.  Current equipment in home: walker, bedside commode, shower chair, hospital bed, hoyer life, wheelchair.    CSW confirmed with Helene Kelp at [facility] and they are active and will resume services at discharge.             Expected Discharge Plan: [place] Barriers to Discharge: No Barriers Identified   Patient Goals and CMS Choice        Expected Discharge Plan and Services Expected Discharge Plan: [place] Choice: [place] arrangements for the past 2 months: Single Family Home                           HH Arranged: PT, OT, RN [place] Agency: [facility] [employer] Date [place]: 08/14/20 Time [place]: 81 Representative spoke with at [place]: [place] Arrangements/Services Living arrangements for the past 2 months: [place] Lives with:: Spouse Patient language and need for interpreter reviewed:: Yes        Need for Family Participation in Patient Care: Yes (Comment) Care giver support system in place?: Yes (comment) Current home services: Home OT, Home PT, Home RN Criminal Activity/Legal Involvement Pertinent to Current Situation/Hospitalization: No - Comment as  needed  Activities of Daily Living Home Assistive Devices/Equipment: Wheelchair ADL Screening (condition at time of admission) Patient's cognitive ability adequate to safely complete daily activities?: No Is the patient deaf or have difficulty hearing?: No Does the patient have difficulty seeing, even when wearing glasses/contacts?: No Does the patient have difficulty concentrating, remembering, or making decisions?: Yes Patient able to express need for assistance with ADLs?: Yes Does the patient have difficulty dressing or bathing?: Yes Independently performs ADLs?: No Communication: Independent Dressing (OT): Needs assistance Is this a change from baseline?: Pre-admission baseline Grooming: Needs assistance Is this a change from baseline?: Pre-admission baseline Feeding: Independent Bathing: Needs assistance Is this a change from baseline?: Pre-admission baseline Toileting: Needs assistance Is this a change from baseline?: Pre-admission baseline In/Out Bed: Needs assistance Is this a change from baseline?: Pre-admission baseline Walks in Home: Needs assistance Is this a change from baseline?: Pre-admission baseline Does the patient have difficulty walking or climbing stairs?: Yes Weakness of Legs: Both Weakness of Arms/Hands: None  Permission Sought/Granted                  Emotional Assessment Appearance:: Appears stated age Attitude/Demeanor/Rapport: Unable to Assess Affect (typically observed): Unable to Assess Orientation: : Oriented to Self Alcohol / Substance Use: Not Applicable Psych Involvement: No (comment)  Admission diagnosis:  Hypokalemia [E87.6] Elevated LFTs [R79.89] Sepsis ([place]) [A41.9] Sepsis due to undetermined  organism ([place]) [A41.9] Altered mental status, unspecified altered mental status type [R41.82] Biliary calculus of other site without obstruction [K80.80] Community acquired pneumonia, unspecified laterality [J18.9] Pressure injury of  other site, stage 2 [facility]) [L89.892] Patient Active Problem List   Diagnosis Date Noted  . Gangrene of both feet ([place])   . Severe protein-calorie malnutrition ([place])   . Sepsis due to undetermined organism POA ([place]) 08/09/2020  . Hypokalemia 08/09/2020  . Microcytic anemia 08/09/2020  . Pressure ulcers of skin of multiple topographic sites POA 08/09/2020  . Hypophosphatemia 08/09/2020  . BPH (benign prostatic hyperplasia)   . Secondary hyperparathyroidism of renal origin ([place]) 04/10/2020  . Pressure injury of sacral region, stage 2 ([place]) 04/10/2020  . Anemia in chronic kidney disease 04/10/2020  . Vitamin D deficiency 04/10/2020  . Aphasia as late effect of stroke 04/10/2020  . Pressure injury of skin 03/07/2020  . ARF (acute renal failure) ([place]) 03/04/2020  . History of CVA (cerebrovascular accident) 03/04/2020  . Hyperlipidemia associated with type 2 diabetes mellitus ([place]) 03/04/2020  . Cellulitis of left lower extremity 02/13/2020  . Peripheral edema   . Leukopenia   . Labile blood pressure   . Acute lower UTI   . Leukocytosis   . Thrombocytopenia ([place])   . Hyponatremia   . Steroid-induced hyperglycemia   . Ischemic stroke of frontal lobe ([place]) 01/10/2020  . Embolic infarction ([place]) 01/10/2020  . Renal transplant, status post   . HTN (hypertension)   . Cerebral embolism with cerebral infarction 01/03/2020  . Brain tumor ([place]) 12/30/2019  . Hip pain, bilateral 12/30/2019  . Elevated PSA 12/30/2019  . Atrial fibrillation, chronic ([place]) 12/30/2019  . Hypomagnesemia 03/20/2019  . Type 2 diabetes mellitus ([place]) 03/15/2019  . Glaucoma 03/15/2019  . Scleral hemorrhage of left eye 03/15/2019  . Gout flare 03/15/2019  . Debility 03/08/2019  . AKI (acute kidney injury) ([place])   . CAP (community acquired pneumonia)   . Kidney transplant recipient   . Sepsis ([place]) 02/27/2019   PCP:  Drosinis, Pamalee Leyden, PA-C Pharmacy:   [id number] [place], [place] [address]. AT  [address] [place] [address] Phone: [phone] Fax: [phone]  [facility] [place], [place] [address] Phone: [phone] Fax: [phone]  [place], [place] [address] [place] [address] Phone: [phone] Fax: [phone]     Social Determinants of Health (SDOH) Interventions    Readmission Risk Interventions Readmission Risk Prevention Plan 12/31/2019  Transportation Screening Complete  HRI or Home Care Consult Complete  Palliative Care Screening Not Applicable  Medication Review (RN Care Manager) Referral to Pharmacy

## 2020-08-14 NOTE — Progress Notes (Signed)
Nutrition Follow Up DOCUMENTATION CODES:   Severe malnutrition in context of chronic illness  INTERVENTION:   Pt would benefit from Cortrak placement with EN given nature of malnutrition and poor PO intake. He is planned to d/c with home health tomorrow. Recommend placement on Monday if he remains inpatient.   Liberalize diet to REGULAR   Ensure Enlive po TID, each supplement provides 350 kcal and 20 grams of protein  Magic cup TID with meals, each supplement provides 290 kcal and 9 grams of protein  MVI daily  NUTRITION DIAGNOSIS:   Severe Malnutrition related to chronic illness (CVA/PVD) as evidenced by moderate fat depletion, severe muscle depletion, percent weight loss.  GOAL:   Patient will meet greater than or equal to 90% of their needs  MONITOR:   PO intake, Supplement acceptance, Weight trends, Labs, I & O's, Skin  REASON FOR ASSESSMENT:   Malnutrition Screening Tool    ASSESSMENT:   Patient with PMH significant for BPH, glaucoma, DM, HLD, DVT, HTN, CVA, kidney transplant recipient, and PVD. Presents this admission with sepsis 2/2 to possible PNA.   Pt unable to provide history upon follow up. No meal completions documented this admission. RD observed breakfast tray with all eggs and sausage left on the plate. Pt refused Ensure this am but states he likes drinking them. Will add magic cup TID.   Pt would benefit from Cortrak placement with EN given nature of malnutrition and poor PO intake. He is planned to d/c with home health tomorrow. Recommend placement on Monday if he remains inpatient.   Admission weight: 72.6 kg   Medications: SS novolog, lantus, remeron, K Phos TID, prednisone Labs: Phosphorus 1.7 (L) CBG 67-133    Most Recent Value  Orbital Region Mild depletion  Upper Arm Region Moderate depletion  Thoracic and Lumbar Region Unable to assess  Buccal Region Moderate depletion  Temple Region Severe depletion  Clavicle Bone Region Severe depletion   Clavicle and Acromion Bone Region Severe depletion  Scapular Bone Region Unable to assess  Dorsal Hand Moderate depletion  Patellar Region Severe depletion  Anterior Thigh Region Severe depletion  Posterior Calf Region Severe depletion  Edema (RD Assessment) None  Hair Reviewed  Eyes Reviewed  Mouth Reviewed  Skin Reviewed  Nails Reviewed     Diet Order:   Diet Order            Diet heart healthy/carb modified Room service appropriate? No; Fluid consistency: Thin  Diet effective now                 EDUCATION NEEDS:   Education needs have been addressed  Skin:  Skin Assessment: Skin Integrity Issues: Skin Integrity Issues:: Other (Comment), Stage II, Unstageable, Diabetic Ulcer Stage II: buttocks Unstageable: R heel, L heel, L foot x2 Diabetic Ulcer: R toes Other: non pressure- R leg  Last BM:  11/15  Height:   Ht Readings from Last 1 Encounters:  08/09/20 5\' 11"  (1.803 m)    Weight:   Wt Readings from Last 1 Encounters:  08/09/20 72.6 kg    BMI:  Body mass index is 22.32 kg/m.  Estimated Nutritional Needs:   Kcal:  2200-2400 kcal  Protein:  115-130 grams  Fluid:  >/= 2 L/day   Mariana Single RD, LDN Clinical Nutrition Pager listed in [place]

## 2020-08-15 LAB — GLUCOSE, CAPILLARY
Glucose-Capillary: 74 mg/dL (ref 70–99)
Glucose-Capillary: 99 mg/dL (ref 70–99)

## 2020-08-15 MED ORDER — MIRTAZAPINE 15 MG PO TABS: 30.0000 mg | ORAL_TABLET | Freq: Every day | ORAL | 0 refills | Status: AC

## 2020-08-15 MED ORDER — SACCHAROMYCES BOULARDII 250 MG PO CAPS: 250.0000 mg | ORAL_CAPSULE | Freq: Two times a day (BID) | ORAL | 0 refills | Status: AC

## 2020-08-15 MED ORDER — LANTUS SOLOSTAR 100 UNIT/ML ~~LOC~~ SOPN: 5.0000 [IU] | PEN_INJECTOR | Freq: Every day | SUBCUTANEOUS | 0 refills | Status: AC

## 2020-08-15 MED ORDER — MAGNESIUM SULFATE 2 GM/50ML IV SOLN
2.0000 g | Freq: Once | INTRAVENOUS | Status: AC
  Administered 2020-08-15: 2 g via INTRAVENOUS
  Filled 2020-08-15: qty 50

## 2020-08-15 MED ORDER — K PHOS MONO-SOD PHOS DI & MONO 155-852-130 MG PO TABS: 500.0000 mg | ORAL_TABLET | Freq: Three times a day (TID) | ORAL | 0 refills | Status: AC

## 2020-08-15 MED ORDER — CIPROFLOXACIN HCL 500 MG PO TABS: 500.0000 mg | ORAL_TABLET | Freq: Two times a day (BID) | ORAL | 0 refills | Status: AC

## 2020-08-15 MED ORDER — INSULIN GLARGINE 100 UNIT/ML ~~LOC~~ SOLN
5.0000 [IU] | Freq: Every day | SUBCUTANEOUS | Status: DC
  Filled 2020-08-15: qty 0.05

## 2020-08-15 NOTE — Progress Notes (Signed)
Notified Dr. Myna Hidalgo on call re patient has 2.14 second pause. VSS, asymptomatic, patient was asleep, easy to awaken. Patient with chronic atrial fib, on metoprolol 12.5 BID and on Eliquis.

## 2020-08-15 NOTE — TOC Transition Note (Addendum)
Transition of Care [facility]) - CM/SW Discharge Note   Patient Details  Name: Ryan Rice MRN: [id number] Date of Birth: [birth year]  [facility]) CM/SW Contact:  Carles Collet, RN Phone Number: 08/15/2020, 11:15 AM   Clinical Narrative:    Damaris Schooner w patient's wife. She confirmed demographics, will need PTAR. PTAR forms filled out, taken to unit. [facility] notified of DC. CM requested nurse to call patient's wife, as she had questions about clothes and DC instructions.   PTAR called.    Final next level of care: [place] Barriers to Discharge: No Barriers Identified   Patient Goals and CMS Choice Patient states their goals for this hospitalization and ongoing recovery are:: to go home CMS Medicare.gov Compare Post Acute Care list provided to:: Other (Comment Required) Choice offered to / list presented to : Spouse  Discharge Placement                       Discharge Plan and Services     Post Acute Care Choice: Home Health                    HH Arranged: PT, OT, RN [facility] Agency: [facility] [employer] Date [place]: 08/15/20 Time [place]: 64 Representative spoke with at [place]: [place] notified of DC  Social Determinants of Health ([place]) Interventions     Readmission Risk Interventions Readmission Risk Prevention Plan 08/14/2020 12/31/2019  Transportation Screening Complete Complete  HRI or [place] - Complete  Palliative Care Screening - Not Applicable  Medication Review (RN Care Manager) - Referral to Pharmacy  PCP or Specialist appointment within 3-5 days of discharge Not Complete -  PCP/Specialist Appt Not Complete comments First available hospital discharge appt 12/6 -  [place] or Home Care Consult Complete -  SW Recovery Care/Counseling Consult Complete -  Palliative Care Screening Not Applicable -  [place] Not Applicable -

## 2020-08-15 NOTE — Discharge Instructions (Signed)
Follow with Primary MD Drosinis, Pamalee Leyden, PA-C in 7 days   Get CBC, CMP,  checked  by Primary MD next visit.    Activity: As tolerated with Full fall precautions use walker/cane & assistance as needed   Disposition Home    Diet: Color diet, please encourage oral and fluid intake, with feeding assistance and aspiration precautions.     On your next visit with your primary care physician please Get Medicines reviewed and adjusted.   Please request your Prim.MD to go over all Hospital Tests and Procedure/Radiological results at the follow up, please get all Hospital records sent to your Prim MD by signing hospital release before you go home.   If you experience worsening of your admission symptoms, develop shortness of breath, life threatening emergency, suicidal or homicidal thoughts you must seek medical attention immediately by calling 911 or calling your MD immediately  if symptoms less severe.  You Must read complete instructions/literature along with all the possible adverse reactions/side effects for all the Medicines you take and that have been prescribed to you. Take any new Medicines after you have completely understood and accpet all the possible adverse reactions/side effects.   Do not drive, operating heavy machinery, perform activities at heights, swimming or participation in water activities or provide baby sitting services if your were admitted for syncope or siezures until you have seen by Primary MD or a Neurologist and advised to do so again.  Do not drive when taking Pain medications.    Do not take more than prescribed Pain, Sleep and Anxiety Medications  Special Instructions: If you have smoked or chewed Tobacco  in the last 2 yrs please stop smoking, stop any regular Alcohol  and or any Recreational drug use.  Wear Seat belts while driving.   Please note  You were cared for by a hospitalist during your hospital stay. If you have any questions about your  discharge medications or the care you received while you were in the hospital after you are discharged, you can call the unit and asked to speak with the hospitalist on call if the hospitalist that took care of you is not available. Once you are discharged, your primary care physician will handle any further medical issues. Please note that NO REFILLS for any discharge medications will be authorized once you are discharged, as it is imperative that you return to your primary care physician (or establish a relationship with a primary care physician if you do not have one) for your aftercare needs so that they can reassess your need for medications and monitor your lab values.

## 2020-08-15 NOTE — Progress Notes (Signed)
Orders were entered by Dr. Myna Hidalgo to give Magnesium IV.

## 2020-08-15 NOTE — Discharge Summary (Addendum)
Ryan Rice, is a 69 y.o. male  DOB Dec 03, 1950  MRN [id number].  Admission date:  08/09/2020  Admitting Physician  Cristal Ford, DO  Discharge Date:  08/15/2020   Primary MD  Drosinis, Pamalee Leyden, PA-C  Recommendations for primary care physician for things to follow:  -CBC, CMP during next visit. -continue encouraging oral intake.   Admission Diagnosis  Hypokalemia [E87.6] Elevated LFTs [R79.89] Sepsis ([place]) [A41.9] Sepsis due to undetermined organism ([place]) [A41.9] Altered mental status, unspecified altered mental status type [R41.82] Biliary calculus of other site without obstruction [K80.80] Community acquired pneumonia, unspecified laterality [J18.9] Pressure injury of other site, stage 2 ([place]) [L89.892]   Discharge Diagnosis  Hypokalemia [E87.6] Elevated LFTs [R79.89] Sepsis ([place]) [A41.9] Sepsis due to undetermined organism ([place]) [A41.9] Altered mental status, unspecified altered mental status type [R41.82] Biliary calculus of other site without obstruction [K80.80] Community acquired pneumonia, unspecified laterality [J18.9] Pressure injury of other site, stage 2 ([place]) [L89.892]    Principal Problem:   Sepsis due to undetermined organism POA ([place]) Active Problems:   Sepsis ([place])   Kidney transplant recipient   Type 2 diabetes mellitus (HCC)   Glaucoma   Atrial fibrillation, chronic (HCC)   HTN (hypertension)   Hyperlipidemia associated with type 2 diabetes mellitus (HCC)   BPH (benign prostatic hyperplasia)   Hypokalemia   Microcytic anemia   Pressure ulcers of skin of multiple topographic sites POA   Hypophosphatemia   Gangrene of both feet (HCC)   Severe protein-calorie malnutrition (HCC)      Past Medical History:  Diagnosis Date  . Allergic rhinitis   . BPH (benign prostatic hyperplasia)   . Chronic atrial fibrillation ([place])   . Diabetes mellitus ([place])   . DVT  (deep venous thrombosis) (HCC)    LLE  . Hematochezia   . HTN (hypertension)   . Hypomagnesemia 03/20/2019  . Kidney transplant recipient   . PVD (peripheral vascular disease) ([place])     Past Surgical History:  Procedure Laterality Date  . KIDNEY TRANSPLANT         History of present illness and  Hospital Course:     Kindly see H&P for history of present illness and admission details, please review complete Labs, Consult reports and Test reports for all details in brief  HPI  from the history and physical done on the day of admission 08/09/2020  HPI: Ryan Rice is a 69 y.o. male with medical history significant of allergic rhinitis, BPH, glaucoma, type 2 diabetes, hyperlipidemia, history of DVT, hypertension, chronic atrial fibrillation, history of embolic CVA, history of hypomagnesemia, kidney transplant recipient, peripheral vascular disease who was brought to the emergency department with complaints of altered mental status and fever.  His wife states that earlier in the day he was having abdominal pain, felt constipated and she gave him a stool softeners without significant relief.  Later the patient started feeling cold and shivering.  This was followed by decreased mentation.  His mental status has improved and he is able to answer  some questions.  He had abdominal pain earlier in the week and felt nauseous, but this was self-limited.  He has been having RUQ pain for some time now, but does not know how long.  ED Course: Initial vital signs were temperature 100 F, pulse 86, respirations 17, blood pressure 123/56 mmHg and O2 sat 99% on room air.  The patient received a 1000 mL NS bolus, potassium supplementation, cefepime, metronidazole or equivalent along with vancomycin per pharmacy dosing.  Labs: CBC showed a white count of 9.8 with 90% neutrophils, hemoglobin 7.6 g/dL and platelets 164.  PT 23.3, INR 2.2 and PTT 39.  CMP shows a potassium of 2.9 and CO2 of 21 mmol/L.  The rest  of the CMP electrolytes are normal.  Anion gap was 11.  Glucose 200, BUN 47 and creatinine 1.24 mg/dL.  Total protein 5.5, albumin 1.9 g/dL.  AST 685, ALT 455 and alkaline phosphatase 517 units/L.  Total bilirubin was 1.6 mg/dL.  Lactic acid was 2.1 and then 2.2 mmol/L.  Phosphorus 2.0 and magnesium 1.7 mg/dL.  Tacrolimus level is pending.  Blood cultures x2 were drawn.  Imaging: His 1 view chest radiograph shows suspected left basilar pneumonic infiltrate.  [place] of RUQ showed cholelithiasis with borderline gallbladder wall thickening and small amount of pericholecystic fluid.  Hospital Course   Acute metabolic encepalopathy -This has improved, he is awake alert x2, more conversant, and answering most of the questions appropriately . -per wife, patient has had memory issues that is intermittent since his previous stroke -Suspect secondary to the infection -Ammonia level 24  Pseudomonas bacteremia/Possible due to community-acquired pneumonia -Patient does not have SIRS criteria on admission, only evidence is elevated lactic acid and altered mental status, otherwise no other regions of sepsis, sepsis ruled out -however patient is immune compromised -Chest x-ray: Reviewed appears patient may have a left infiltrate -Initially placed on vancomycin and Zosyn -Vancomycin discontinued due to negative MRSA -Blood cultures 1/2 aerobic bottle showing gram-negative rods, Pseudomonas aeruginosa-sensitivities show resistance to Zosyn, transitioned to cefepime, discussed with ID, can be transitioned to oral Cipro on discharge, patient was treated with cefepime during hospital stay, and will receive another 7 days of oral Cipro on discharge . 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 -Vascular surgery orthopedic consult greatly appreciated, patient is very poor candidate for revascularization option, current plan to continue with local wound care, at one point likely he will need 1 or bilateral amputation once wound ulcers or he develop OM/infected wound .  I have discussed this with wife.  Hypokalemia -Resolved with replacement, continue to monitor and replace as needed  Hypophosphatemia -Resolved with replacement, he will be discharged on Neutra-Phos for 3 days as an outpatient  Kidney transplant -renal Function is stable, continue with home medications.  Diabetes mellitus, type II -Overall with poor appetite, so he lowered his Lantus to 5 units on discharge  Glaucoma -Continue eyedrops  Atrial fibrillation, chronic -CHA2DS2-VASc score at  least 6 -Initially on heparin GTT in anticipation for procedures, now he is back on his apixaban.  Essential hypertension -Resume home meds on discharge -Toprol has been initially held for bradycardia, this has been resumed at a lower dose, no recurrence of bradycardia, he has some pauses around 2 seconds which are asymptomatic.  Hyperlipidemia -Statin currently held due to elevated LFTs  BPH -Continue Flomax, Proscar  Microcytic anemia/anemia of chronic illness -chronic, around baseline, he received units PRBC transfusion with good response -hemoccult if negative.  Severe protein calorie malnutrition/failure to thrive -He remains with very poor appetite,  I have  increased his Remeron, nutritionist has been consulted  Hypophosphatemia -Repleting with Neutra-Phos  Pressure ulcers -Continue with wound care Pressure Injury 08/09/20 Buttocks Right Stage 2 -  Partial thickness loss of dermis presenting as a shallow open injury with a red, pink wound bed without slough. (Active)  08/09/20 2130  Location: Buttocks  Location Orientation: Right  Staging: Stage 2 -  Partial thickness loss of dermis presenting as a shallow open injury with a red, pink wound bed without slough.  Wound Description (Comments):   Present on Admission: Yes     Pressure Injury 08/09/20 Heel Right Unstageable - Full thickness tissue loss in which the base of the injury is covered by slough (yellow, tan, gray, green or brown) and/or eschar (tan, brown or black) in the wound bed. (Active)  08/09/20 2130  Location: Heel  Location Orientation: Right  Staging: Unstageable - Full thickness tissue loss in which the base of the injury is covered by slough (yellow, tan, gray, green or brown) and/or eschar (tan, brown or black) in the wound bed.  Wound Description (Comments):   Present on Admission: Yes     Pressure Injury 08/09/20 Heel Left Unstageable - Full thickness tissue loss in which the base of the injury is covered by slough (yellow, tan, gray, green or brown) and/or eschar (tan, brown or black) in the wound bed. (Active)  08/09/20 2130  Location: Heel  Location Orientation: Left  Staging: Unstageable - Full thickness tissue loss in which the base of the injury is covered by slough (yellow, tan, gray, green or brown) and/or eschar (tan, brown or black) in the wound bed.  Wound Description (Comments):   Present on Admission: Yes     Pressure Injury 08/09/20 Foot Lateral;Left Unstageable - Full thickness tissue loss in which the base of the injury is covered by slough (yellow, tan, gray, green or brown) and/or eschar (tan, brown or black) in the wound bed. (Active)  08/09/20  2130  Location: Foot  Location Orientation: Lateral;Left  Staging: Unstageable - Full thickness tissue loss in which the base of the injury is covered by slough (yellow, tan, gray, green or brown) and/or eschar (tan, brown or black) in the wound bed.  Wound Description (Comments):   Present on Admission: Yes     Pressure Injury 08/09/20 Foot Left;Lateral Unstageable - Full thickness tissue loss in which the base of the injury is covered by slough (yellow, tan, gray, green or brown) and/or eschar (tan, brown or black) in the wound bed. (Active)  08/09/20 2130  Location: Foot  Location Orientation: Left;Lateral  Staging: Unstageable - Full thickness tissue loss in which the base of the injury is covered by slough (yellow, tan, gray, green or brown) and/or eschar (tan, brown or black) in the wound bed.  Wound Description (Comments):   Present on Admission: Yes         Discharge  Condition:  Stable, but high risk for readmission given severe PVT, chronic wounds, and failure to thrive with severe protein calorie malnutrition   Follow UP   Follow-up Information    [facility] Call.   Specialty: General Surgery Why: Call and schedule an appointment to discuss elective gallbladder surgery if desired.  Contact information: [address] [address] [phone]        Drosinis, Pamalee Leyden, PA-C. Go on 08/31/2020.   Specialty: Internal Medicine Why: Please attend your hospital discharge appt with Luiz Ochoa on Monday, 08/31/20, at 10:40am.  Contact information: [place] [address] [phone]        [facility] Follow up.   Specialty: Home Health Services Why: [facility] will contact you within 24-48 hours to schedule your next visit. Contact information: [place] [address] [phone]                 Discharge Instructions  and  Discharge Medications    Discharge Instructions     Discharge instructions   Complete by: As directed    Follow with Primary MD Drosinis, Pamalee Leyden, PA-C in 7 days   Get CBC, CMP,  checked  by Primary MD next visit.    Activity: As tolerated with Full fall precautions use walker/cane & assistance as needed   Disposition Home    Diet: Color diet, please encourage oral and fluid intake, with feeding assistance and aspiration precautions.     On your next visit with your primary care physician please Get Medicines reviewed and adjusted.   Please request your Prim.MD to go over all Hospital Tests and Procedure/Radiological results at the follow up, please get all Hospital records sent to your Prim MD by signing hospital release before you go home.   If you experience worsening of your admission symptoms, develop shortness of breath, life threatening emergency, suicidal or homicidal thoughts you must seek medical attention immediately by calling 911 or calling your MD immediately  if symptoms less severe.  You Must read complete instructions/literature along with all the possible adverse reactions/side effects for all the Medicines you take and that have been prescribed to you. Take any new Medicines after you have completely understood and accpet all the possible adverse reactions/side effects.   Do not drive, operating heavy machinery, perform activities at heights, swimming or participation in water activities or provide baby sitting services if your were admitted for syncope or siezures until you have seen by Primary MD or a Neurologist and advised to do so again.  Do not drive when taking Pain medications.    Do not take more than prescribed Pain, Sleep and Anxiety Medications  Special Instructions: If you have smoked or chewed Tobacco  in the last 2 yrs please stop smoking, stop any regular Alcohol  and or any Recreational drug use.  Wear Seat belts while driving.   Please note  You were cared for by a hospitalist during your  hospital stay. If you have any questions about your discharge medications or the care you received while you were in the hospital after you are discharged, you can call the unit and asked to speak with the hospitalist on call if the hospitalist that took care of you is not available. Once you are discharged, your primary care physician will handle any further medical issues. Please note that NO REFILLS for any discharge medications will be authorized once you  are discharged, as it is imperative that you return to your primary care physician (or establish a relationship with a primary care physician if you do not have one) for your aftercare needs so that they can reassess your need for medications and monitor your lab values.   Discharge wound care:   Complete by: As directed    Clean bilateral LE with soap and water. Pat dry.  Right and left great toes and 3rd toe of left foot apply iodine (in clean supply) and allow to air dry. Apply daily.  Right heel and Right lateral LE apply xeroform gauze Kellie Simmering # 294) cover with 4 x 4 and wrap with kerlex from the foot to just below the knee. Change daily.  Left heel and left lateral foot: apply santyl, cover with moistened gauze and wrap with kerlex. Change daily.  Groin, bilateral upper inner thighs: clean with soap and water, pat dry. Apply triple paste to the entire area. Keep patient clean and dry PRN.   Increase activity slowly   Complete by: As directed      Allergies as of 08/15/2020      Reactions   Iodinated Diagnostic Agents Hives   Tape Hives      Medication List    STOP taking these medications   hydrALAZINE 25 MG tablet Commonly known as: APRESOLINE   sitaGLIPtin 25 MG tablet Commonly known as: JANUVIA   sulfamethoxazole-trimethoprim 800-160 MG tablet Commonly known as: BACTRIM DS     TAKE these medications   acetaminophen 325 MG tablet Commonly known as: TYLENOL Take 650 mg by mouth every 6 (six) hours as needed.    apixaban 5 MG Tabs tablet Commonly known as: ELIQUIS Take 1 tablet (5 mg total) by mouth 2 (two) times daily.   artificial tears Oint ophthalmic ointment Commonly known as: LACRILUBE Place into the left eye at bedtime as needed for dry eyes.   atorvastatin 80 MG tablet Commonly known as: LIPITOR Take 1 tablet (80 mg total) by mouth daily at 6 PM.   brimonidine 0.2 % ophthalmic solution Commonly known as: ALPHAGAN Place 1 drop into the right eye 2 (two) times daily.   cholecalciferol 25 MCG (1000 UNIT) tablet Commonly known as: VITAMIN D Take 2 tablets (2,000 Units total) by mouth daily.   cinacalcet 60 MG tablet Commonly known as: SENSIPAR Take 1 tablet (60 mg total) by mouth every morning. With Breakfast. What changed: how much to take   ciprofloxacin 500 MG tablet Commonly known as: Cipro Take 1 tablet (500 mg total) by mouth 2 (two) times daily for 7 days.   dorzolamide-timolol 22.3-6.8 MG/ML ophthalmic solution Commonly known as: COSOPT Place 1 drop into the right eye 2 (two) times daily.   famotidine 10 MG tablet Commonly known as: PEPCID Take 1 tablet (10 mg total) by mouth daily.   finasteride 5 MG tablet Commonly known as: PROSCAR Take 1 tablet (5 mg total) by mouth daily.   folic acid 1 MG tablet Commonly known as: FOLVITE Take 1 tablet (1 mg total) by mouth daily.   Glucerna Liqd Take 237 mLs by mouth daily.   Insulin Pen Needle 32G X 6 MM Misc 1 application by Does not apply route daily.   Lantus SoloStar 100 UNIT/ML Solostar Pen Generic drug: insulin glargine Inject 5 Units into the skin at bedtime. What changed: how much to take   magnesium oxide 400 MG tablet Commonly known as: MAG-OX Take 1 tablet (400 mg total) by mouth 2 (  two) times daily.   metoprolol tartrate 25 MG tablet Commonly known as: LOPRESSOR Take 0.5 tablets (12.5 mg total) by mouth 2 (two) times daily. What changed:   when to take this  additional instructions    mirtazapine 15 MG tablet Commonly known as: REMERON Take 2 tablets (30 mg total) by mouth at bedtime. What changed: how much to take   mycophenolate 180 MG EC tablet Commonly known as: MYFORTIC Take 2 tablets (360 mg total) by mouth 2 (two) times daily.   NIFEdipine 60 MG 24 hr tablet Commonly known as: ADALAT CC Take 1 tablet (60 mg total) by mouth daily.   phosphorus 155-852-130 MG tablet Commonly known as: K PHOS NEUTRAL Take 2 tablets (500 mg total) by mouth 3 (three) times daily for 3 days.   predniSONE 2.5 MG tablet Commonly known as: DELTASONE Take 3 tablets (7.5 mg total) by mouth daily. Take 3 tablets to = 7.5 mg What changed:   how much to take  when to take this  additional instructions   saccharomyces boulardii 250 MG capsule Commonly known as: Florastor Take 1 capsule (250 mg total) by mouth 2 (two) times daily.   sodium bicarbonate 650 MG tablet Take 2 tablets (1,300 mg total) by mouth 2 (two) times daily.   tacrolimus 0.5 MG capsule Commonly known as: PROGRAF Take 3 capsules (1.5 mg total) by mouth 2 (two) times daily. What changed: how much to take   tamsulosin 0.4 MG Caps capsule Commonly known as: FLOMAX Take 1 capsule (0.4 mg total) by mouth every morning.   THERA M PLUS PO Take 1 tablet by mouth daily.   Voltaren 1 % Gel Generic drug: diclofenac Sodium Apply 2 g topically daily as needed (pain).            Discharge Care Instructions  (From admission, onward)         Start     Ordered   08/15/20 0000  Discharge wound care:       Comments: Clean bilateral LE with soap and water. Pat dry.  Right and left great toes and 3rd toe of left foot apply iodine (in clean supply) and allow to air dry. Apply daily.  Right heel and Right lateral LE apply xeroform gauze Kellie Simmering # 294) cover with 4 x 4 and wrap with kerlex from the foot to just below the knee. Change daily.  Left heel and left lateral foot: apply santyl, cover with moistened  gauze and wrap with kerlex. Change daily.  Groin, bilateral upper inner thighs: clean with soap and water, pat dry. Apply triple paste to the entire area. Keep patient clean and dry PRN.   08/15/20 1105            Diet and Activity recommendation: See Discharge Instructions above  Consults obtained -  -General surgery -vascular surgery -Orthopedic     Major procedures and Radiology Reports - PLEASE review detailed and final reports for all details, in brief -      NM Hepato W/EF  Result Date: 08/10/2020 CLINICAL DATA:  Abdominal pain. Constipation. Gallbladder stones on prior CT ultrasound 08/09/2020 also demonstrated gallstones EXAM: NUCLEAR MEDICINE HEPATOBILIARY IMAGING WITH GALLBLADDER EF TECHNIQUE: Sequential images of the abdomen were obtained out to 60 minutes following intravenous administration of radiopharmaceutical. After oral ingestion of Ensure, gallbladder ejection fraction was determined. At 60 min, normal ejection fraction is greater than 33%. RADIOPHARMACEUTICALS:  4.2 mCi Tc-80m Choletec IV COMPARISON:  Ultrasound 08/09/2020 FINDINGS: There is  prompt uptake of radiotracer within liver. Counts are slow to travel into the common bile duct. The gallbladder begins to fill at approximately 50 minutes. The gallbladder fills in completely by 60 minutes. Upon administration of fatty meal, the gallbladder does contract and counts are evident within the small bowel. Calculated gallbladder ejection fraction is near 100%%. (Normal gallbladder ejection fraction with Ensure is greater than 33%.) IMPRESSION: 1. Patent cystic duct and common bile duct. 2. Normal gallbladder ejection fraction. 3. The gallbladder has some delayed filling and does not appear to fill completely which could indicate chronic cholecystitis. Electronically Signed   By: Suzy Bouchard M.D.   On: 08/10/2020 16:53   US Abdomen Limited  Result Date: 08/09/2020 CLINICAL DATA:  Elevated LFTs EXAM: ULTRASOUND  ABDOMEN LIMITED RIGHT UPPER QUADRANT COMPARISON:  None. FINDINGS: Gallbladder: Multiple gallbladder calculi. Small amount of pericholecystic fluid. Mild gallbladder wall thickening. Sonographic Percell Miller sign could not be assessed due to patient altered mental status. Common bile duct: Diameter: 7 mm. Liver: Trace perihepatic free fluid. Portal vein is patent on color Doppler imaging with normal direction of blood flow towards the liver. Other: None. IMPRESSION: Cholelithiasis with borderline gallbladder wall thickening and small amount of pericholecystic fluid. Electronically Signed   By: Ulyses Jarred M.D.   On: 08/09/2020 05:09   DG Chest Portable 1 View  Result Date: 08/09/2020 CLINICAL DATA:  Fever, altered mental status EXAM: PORTABLE CHEST 1 VIEW COMPARISON:  03/04/2020 FINDINGS: The patient is mildly rotated on this semi erect portable chest radiograph. Despite this, mild left basilar airspace infiltrate is suspected. The chin overlies the lung apices. No definite pneumothorax or pleural effusion. Cardiac size is mildly enlarged, stable since prior examination. Pulmonary vascularity is normal. No acute bone abnormality. IMPRESSION: Suspected left basilar pneumonic infiltrate. Electronically Signed   By: Fidela Salisbury MD   On: 08/09/2020 01:54   DG Foot Complete Left  Result Date: 08/10/2020 CLINICAL DATA:  69 year old male with using foot ulcers, bilateral heel ulcers right greater than left. EXAM: LEFT FOOT - COMPLETE 3+ VIEW COMPARISON:  Left ankle series 03/05/2019. FINDINGS: Extensive calcified peripheral vascular disease. No discrete soft tissue wound is evident. No soft tissue gas. Calcaneus appears intact. Other tarsal bones appear intact. Chronic posttraumatic or degenerative spurring at the tibial plateau. Metatarsals and phalanges appear intact. No acute osseous abnormality identified. IMPRESSION: 1. No acute osseous abnormality. No radiographic evidence of osteomyelitis. 2. Extensive  calcified peripheral vascular disease. Electronically Signed   By: Genevie Ann M.D.   On: 08/10/2020 10:52   DG Foot Complete Right  Result Date: 08/10/2020 CLINICAL DATA:  69 year old male with using foot ulcers, bilateral heel ulcers right greater than left. EXAM: RIGHT FOOT COMPLETE - 3+ VIEW COMPARISON:  Right foot radiographs 06/23/2020. FINDINGS: Severe calcified peripheral vascular disease. Soft tissue swelling and irregularity overlying the dorsal calcaneus, but underlying bony cortex appears stable since September. No calcaneus fracture or dislocation. Other tarsal bones appear stable. There is a new healed or healing mildly distracted fracture at the base of the 5th metatarsal. Unclear if this was subtle on the September radiographs. No cortical osteolysis. Insufficiency fracture favored. Otherwise the metatarsals appear intact. Phalanges appear stable. No soft tissue gas. Soft tissue swelling also appears increased in the distal right foot since September. IMPRESSION: 1. No plain radiographic evidence of osteomyelitis. Increased soft tissue swelling since September. No soft tissue gas. 2. Healed or healing insufficiency fracture suspected at the base of the 5th metatarsal. 3. Severe calcified peripheral  vascular disease. Electronically Signed   By: Genevie Ann M.D.   On: 08/10/2020 10:54   VAS [place] ABI WITH/WO TBI  Result Date: 08/11/2020 LOWER EXTREMITY DOPPLER STUDY Indications: Ulceration. High Risk Factors: Hypertension, hyperlipidemia, Diabetes.  Limitations: Today's exam was limited due to patient positioning, an open wound              and bandages. Comparison Study: No prior studies. Performing Technologist: Carlos Levering RVT  Examination Guidelines: A complete evaluation includes at minimum, Doppler waveform signals and systolic blood pressure reading at the level of bilateral brachial, anterior tibial, and posterior tibial arteries, when vessel segments are accessible. Bilateral testing is  considered an integral part of a complete examination. Photoelectric Plethysmograph (PPG) waveforms and toe systolic pressure readings are included as required and additional duplex testing as needed. Limited examinations for reoccurring indications may be performed as noted.  ABI Findings: +--------+------------------+-----+----------+--------+ Right   Rt Pressure (mmHg)IndexWaveform  Comment  +--------+------------------+-----+----------+--------+ [id number]                    triphasic          +--------+------------------+-----+----------+--------+ PTA     254               1.55 monophasic         +--------+------------------+-----+----------+--------+ DP      254               1.55 monophasic         +--------+------------------+-----+----------+--------+ +--------+------------------+-----+----------+--------------+ Left    Lt Pressure (mmHg)IndexWaveform  Comment        +--------+------------------+-----+----------+--------------+ Brachial                                 Restricted arm +--------+------------------+-----+----------+--------------+ PTA     254               1.55 monophasic               +--------+------------------+-----+----------+--------------+ DP      254               1.55 monophasic               +--------+------------------+-----+----------+--------------+  Summary: Right: Resting right ankle-brachial index indicates noncompressible right lower extremity arteries. Unable to obtain TBI due to great toe ulceration. Left: Resting left ankle-brachial index indicates noncompressible left lower extremity arteries. Unable to obtain TBI due to great toe ulceration.  *See table(s) above for measurements and observations.  Electronically signed by Curt Jews MD on 08/11/2020 at 4:35:42 PM.   Final    VAS [place] LOWER EXTREMITY ARTERIAL DUPLEX  Result Date: 08/11/2020 LOWER EXTREMITY ARTERIAL DUPLEX STUDY Indications: Ulceration. High Risk Factors:  Hypertension, hyperlipidemia, Diabetes.  Current ABI: Right and left PT and DP vessels are noncompressible. Limitations: Patient movement, patient anatomy Comparison Study: No prior studies. Performing Technologist: Oliver Hum RVT  Examination Guidelines: A complete evaluation includes B-mode imaging, spectral Doppler, color Doppler, and power Doppler as needed of all accessible portions of each vessel. Bilateral testing is considered an integral part of a complete examination. Limited examinations for reoccurring indications may be performed as noted.  +-----------+--------+-----+---------------+----------+--------+ RIGHT      PSV cm/sRatioStenosis       Waveform  Comments +-----------+--------+-----+---------------+----------+--------+ CFA Distal 33  biphasic           +-----------+--------+-----+---------------+----------+--------+ DFA        207          50-74% stenosismonophasic         +-----------+--------+-----+---------------+----------+--------+ SFA Prox   519          75-99% stenosismonophasic         +-----------+--------+-----+---------------+----------+--------+ SFA Mid    63                          monophasic         +-----------+--------+-----+---------------+----------+--------+ SFA Distal 71                          monophasic         +-----------+--------+-----+---------------+----------+--------+ POP Prox   77                          monophasic         +-----------+--------+-----+---------------+----------+--------+ POP Distal 24                          monophasic         +-----------+--------+-----+---------------+----------+--------+ ATA Prox   45                          monophasic         +-----------+--------+-----+---------------+----------+--------+ ATA Mid    18                          monophasic         +-----------+--------+-----+---------------+----------+--------+ ATA Distal 22                           monophasic         +-----------+--------+-----+---------------+----------+--------+ PTA Prox   30                          monophasic         +-----------+--------+-----+---------------+----------+--------+ PTA Mid    42                          monophasic         +-----------+--------+-----+---------------+----------+--------+ PTA Distal 53                          monophasic         +-----------+--------+-----+---------------+----------+--------+ PERO Prox  29                          monophasic         +-----------+--------+-----+---------------+----------+--------+ PERO Mid   27                          monophasic         +-----------+--------+-----+---------------+----------+--------+ PERO Distal24                          monophasic         +-----------+--------+-----+---------------+----------+--------+ DP         43  monophasic         +-----------+--------+-----+---------------+----------+--------+  +-----------+--------+-----+--------+----------+--------+ LEFT       PSV cm/sRatioStenosisWaveform  Comments +-----------+--------+-----+--------+----------+--------+ CFA Distal 102                  triphasic          +-----------+--------+-----+--------+----------+--------+ DFA        64                   biphasic           +-----------+--------+-----+--------+----------+--------+ SFA Prox   143                  triphasic          +-----------+--------+-----+--------+----------+--------+ SFA Mid    47                   biphasic           +-----------+--------+-----+--------+----------+--------+ SFA Distal 124                  biphasic           +-----------+--------+-----+--------+----------+--------+ POP Prox   114                  triphasic          +-----------+--------+-----+--------+----------+--------+ POP Distal 43                   biphasic            +-----------+--------+-----+--------+----------+--------+ ATA Prox   22                   monophasic         +-----------+--------+-----+--------+----------+--------+ ATA Mid    23                   monophasic         +-----------+--------+-----+--------+----------+--------+ ATA Distal 24                   monophasic         +-----------+--------+-----+--------+----------+--------+ PTA Prox   73                   monophasic         +-----------+--------+-----+--------+----------+--------+ PTA Mid    80                   monophasic         +-----------+--------+-----+--------+----------+--------+ PTA Distal 92                   monophasic         +-----------+--------+-----+--------+----------+--------+ PERO Prox  9                    monophasic         +-----------+--------+-----+--------+----------+--------+ PERO Mid   21                   monophasic         +-----------+--------+-----+--------+----------+--------+ PERO Distal28                   monophasic         +-----------+--------+-----+--------+----------+--------+ DP         38                   monophasic         +-----------+--------+-----+--------+----------+--------+  Summary: Right: 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 75-99% stenosis noted in the superficial femoral artery. Calcific plaque noted throughout all visualized lower extremity arteries. Left: Calcific plaque noted throughout all visualized lower extremity arteries.  See table(s) above for measurements and observations.    Preliminary     Micro Results     Recent Results (from the past 240 hour(s))  Culture, blood (x 2)     Status: Abnormal   Collection Time: 08/09/20  1:47 AM   Specimen: BLOOD  Result Value Ref Range Status   Specimen Description BLOOD LEFT ANTECUBITAL  Final   Special Requests   Final    BOTTLES DRAWN AEROBIC AND ANAEROBIC Blood Culture adequate volume   Culture  Setup Time    Final    GRAM NEGATIVE RODS AEROBIC BOTTLE ONLY Organism ID to follow CRITICAL RESULT CALLED TO, READ BACK BY AND VERIFIED WITH: PHRMD L SEAY _0  08/10/20 BY S GEZAHEGN Performed at [facility], [address], [place], [place] [address]    Culture PSEUDOMONAS AERUGINOSA (A)  Final   Report Status 08/11/2020 FINAL  Final   Organism ID, Bacteria PSEUDOMONAS AERUGINOSA  Final      Susceptibility   Pseudomonas aeruginosa - MIC*    CEFTAZIDIME 32 RESISTANT Resistant     CIPROFLOXACIN 1 SENSITIVE Sensitive     GENTAMICIN <=1 SENSITIVE Sensitive     IMIPENEM >=16 RESISTANT Resistant     PIP/TAZO >=128 RESISTANT Resistant     CEFEPIME 8 SENSITIVE Sensitive     * PSEUDOMONAS AERUGINOSA  Blood Culture ID Panel (Reflexed)     Status: Abnormal   Collection Time: 08/09/20  1:47 AM  Result Value Ref Range Status   Enterococcus faecalis NOT DETECTED NOT DETECTED Final   Enterococcus Faecium NOT DETECTED NOT DETECTED Final   Listeria monocytogenes NOT DETECTED NOT DETECTED Final   Staphylococcus species NOT DETECTED NOT DETECTED Final   Staphylococcus aureus (BCID) NOT DETECTED NOT DETECTED Final   Staphylococcus epidermidis NOT DETECTED NOT DETECTED Final   Staphylococcus lugdunensis NOT DETECTED NOT DETECTED Final   Streptococcus species NOT DETECTED NOT DETECTED Final   Streptococcus agalactiae NOT DETECTED NOT DETECTED Final   Streptococcus pneumoniae NOT DETECTED NOT DETECTED Final   Streptococcus pyogenes NOT DETECTED NOT DETECTED Final   A.calcoaceticus-baumannii NOT DETECTED NOT DETECTED Final   Bacteroides fragilis NOT DETECTED NOT DETECTED Final   Enterobacterales NOT DETECTED NOT DETECTED Final   Enterobacter cloacae complex NOT DETECTED NOT DETECTED Final   Escherichia coli NOT DETECTED NOT DETECTED Final   Klebsiella aerogenes NOT DETECTED NOT DETECTED Final   Klebsiella oxytoca NOT DETECTED NOT DETECTED Final   Klebsiella pneumoniae NOT DETECTED NOT DETECTED Final    Proteus species NOT DETECTED NOT DETECTED Final   Salmonella species NOT DETECTED NOT DETECTED Final   Serratia marcescens NOT DETECTED NOT DETECTED Final   Haemophilus influenzae NOT DETECTED NOT DETECTED Final   Neisseria meningitidis NOT DETECTED NOT DETECTED Final   Pseudomonas aeruginosa DETECTED (A) NOT DETECTED Final    Comment: CRITICAL RESULT CALLED TO, READ BACK BY AND VERIFIED WITH: PHRMD L SEAY _1  08/10/20 BY S GEZAHEGN    Stenotrophomonas maltophilia NOT DETECTED NOT DETECTED Final   Candida albicans NOT DETECTED NOT DETECTED Final   Candida auris NOT DETECTED NOT DETECTED Final   Candida glabrata NOT DETECTED NOT DETECTED Final   Candida krusei NOT DETECTED NOT DETECTED Final   Candida parapsilosis NOT DETECTED NOT DETECTED Final   Candida tropicalis NOT DETECTED NOT DETECTED Final  Cryptococcus neoformans/gattii NOT DETECTED NOT DETECTED Final   CTX-M ESBL NOT DETECTED NOT DETECTED Final   Carbapenem resistance IMP NOT DETECTED NOT DETECTED Final   Carbapenem resistance KPC NOT DETECTED NOT DETECTED Final   Carbapenem resistance NDM NOT DETECTED NOT DETECTED Final   Carbapenem resistance VIM NOT DETECTED NOT DETECTED Final    Comment: Performed at [facility], [place] [address], [place], [place] [address]  Culture, blood (x 2)     Status: None   Collection Time: 08/09/20  2:21 AM   Specimen: BLOOD RIGHT HAND  Result Value Ref Range Status   Specimen Description BLOOD RIGHT HAND  Final   Special Requests   Final    BOTTLES DRAWN AEROBIC AND ANAEROBIC Blood Culture adequate volume   Culture   Final    NO GROWTH 5 DAYS Performed at [facility], [place] [address] [place] [address], [place], [place] [address]    Report Status 08/14/2020 FINAL  Final  Respiratory Panel by RT PCR (Flu A&B, Covid) - Nasopharyngeal Swab     Status: None   Collection Time: 08/09/20  7:14 AM   Specimen: Nasopharyngeal Swab  Result Value Ref Range Status   SARS Coronavirus 2 by RT PCR NEGATIVE  NEGATIVE Final    Comment: (NOTE) SARS-CoV-2 target nucleic acids are NOT DETECTED.  The SARS-CoV-2 RNA is generally detectable in upper respiratoy specimens during the acute phase of infection. The lowest concentration of SARS-CoV-2 viral copies this assay can detect is 131 copies/mL. A negative result does not preclude SARS-Cov-2 infection and should not be used as the sole basis for treatment or other patient management decisions. A negative result may occur with  improper specimen collection/handling, submission of specimen other than nasopharyngeal swab, presence of viral mutation(s) within the areas targeted by this assay, and inadequate number of viral copies (<131 copies/mL). A negative result must be combined with clinical observations, patient history, and epidemiological information. The expected result is Negative.  Fact Sheet for Patients:  PinkCheek.be  Fact Sheet for Healthcare Providers:  GravelBags.it  This test is no t yet approved or cleared by the [place] FDA and  has been authorized for detection and/or diagnosis of SARS-CoV-2 by FDA under an Emergency Use Authorization (EUA). This EUA will remain  in effect (meaning this test can be used) for the duration of the COVID-19 declaration under Section 564(b)(1) of the Act, 21 U.S.C. section 360bbb-3(b)(1), unless the authorization is terminated or revoked sooner.     Influenza A by PCR NEGATIVE NEGATIVE Final   Influenza B by PCR NEGATIVE NEGATIVE Final    Comment: (NOTE) The Xpert Xpress SARS-CoV-2/FLU/RSV assay is intended as an aid in  the diagnosis of influenza from Nasopharyngeal swab specimens and  should not be used as a sole basis for treatment. Nasal washings and  aspirates are unacceptable for Xpert Xpress SARS-CoV-2/FLU/RSV  testing.  Fact Sheet for Patients: PinkCheek.be  Fact Sheet for Healthcare  Providers: GravelBags.it  This test is not yet approved or cleared by the [place] FDA and  has been authorized for detection and/or diagnosis of SARS-CoV-2 by  FDA under an Emergency Use Authorization (EUA). This EUA will remain  in effect (meaning this test can be used) for the duration of the  Covid-19 declaration under Section 564(b)(1) of the Act, 21  U.S.C. section 360bbb-3(b)(1), unless the authorization is  terminated or revoked. Performed at [facility], [place] [address], [place], [place] [address]   MRSA PCR  Screening     Status: None   Collection Time: 08/09/20  8:23 PM   Specimen: Nasal Mucosa; Nasopharyngeal  Result Value Ref Range Status   MRSA by PCR NEGATIVE NEGATIVE Final    Comment:        The GeneXpert MRSA Assay (FDA approved for NASAL specimens only), is one component of a comprehensive MRSA colonization surveillance program. It is not intended to diagnose MRSA infection nor to guide or monitor treatment for MRSA infections. Performed at [facility], [place] [address] [place] [address], [place], [place] [address]   Culture, blood (routine x 2)     Status: None (Preliminary result)   Collection Time: 08/11/20  6:48 AM   Specimen: BLOOD RIGHT HAND  Result Value Ref Range Status   Specimen Description BLOOD RIGHT HAND  Final   Special Requests   Final    BOTTLES DRAWN AEROBIC ONLY Blood Culture adequate volume   Culture   Final    NO GROWTH 3 DAYS Performed at [facility], [place] [address], [place], [place] [address]    Report Status PENDING  Incomplete  Culture, blood (routine x 2)     Status: None (Preliminary result)   Collection Time: 08/11/20  6:49 AM   Specimen: BLOOD RIGHT WRIST  Result Value Ref Range Status   Specimen Description BLOOD RIGHT WRIST  Final   Special Requests   Final    BOTTLES DRAWN AEROBIC ONLY Blood Culture adequate volume   Culture   Final    NO GROWTH 3 DAYS Performed at [facility], [address], [place], [place] [address]    Report Status PENDING  Incomplete       Today   Subjective:   Ryan Rice today has no headache,no chest or abdominal pain, still reports generalized weakness and fatigue, no fever, no chills. Objective:   Blood pressure (!) 167/85, pulse 66, temperature 97.7 F (36.5 C), temperature source Oral, resp. rate 16, height _0  (1.803 m), weight 72.6 kg, SpO2 99 %.   Intake/Output Summary (Last 24 hours) at 08/15/2020 1106 Last data filed at 08/15/2020 0603 Gross per 24 hour  Intake 386.47 ml  Output 1900 ml  Net -1513.53 ml    Exam Awake Alert, patient, communicative, easily distracted, clinically frail and ill-appearing, severely deconditioned Symmetrical Chest wall movement, Good air movement bilaterally, CTAB RRR,No Gallops,Rubs or new Murmurs, No Parasternal Heave +ve B.Sounds, Abd Soft, Non tender, No organomegaly appriciated, No rebound -guarding or rigidity. Patient with chronic lower extremity wounds  Data Review   CBC w Diff:  Lab Results  Component Value Date   WBC 4.9 08/14/2020   HGB 9.8 (L) 08/14/2020   HGB 7.8 (L) 02/28/2019   HCT 32.0 (L) 08/14/2020   PLT 128 (L) 08/14/2020   LYMPHOPCT 4 08/10/2020   MONOPCT 3 08/10/2020   EOSPCT 0 08/10/2020   BASOPCT 0 08/10/2020    CMP:  Lab Results  Component Value Date   NA 137 08/14/2020   NA 137 05/04/2020   K 4.0 08/14/2020   CL 111 08/14/2020   CO2 21 (L) 08/14/2020   BUN 18 08/14/2020   BUN 29 (A) 05/04/2020   CREATININE 0.78 08/14/2020   GLU 53 05/04/2020   PROT 5.6 (L) 08/14/2020   ALBUMIN 1.7 (L) 08/14/2020   BILITOT 0.8 08/14/2020   ALKPHOS 255 (H) 08/14/2020   AST 27 08/14/2020   ALT 78 (H) 08/14/2020  .   Total Time in preparing  paper work, data evaluation and todays exam - 24 minutes  Phillips Climes M.D on 08/15/2020 at 11:06 AM  [facility]   Office  [phone]

## 2020-08-16 LAB — CULTURE, BLOOD (ROUTINE X 2)
Culture: NO GROWTH
Culture: NO GROWTH
Special Requests: ADEQUATE
Special Requests: ADEQUATE

## 2020-09-11 ENCOUNTER — Other Ambulatory Visit: Payer: Self-pay

## 2020-10-23 ENCOUNTER — Other Ambulatory Visit: Payer: Self-pay | Admitting: Adult Health

## 2020-10-23 DIAGNOSIS — Z940 Kidney transplant status: Secondary | ICD-10-CM

## 2020-11-24 DEATH — deceased

## 2021-10-24 IMAGING — MR MR HEAD WO/W CM
15 of 17 series · 42 of 48 positions shown · IV contrast (Gadavist)
Comparison: Head CT 12/29/2019

CLINICAL DATA: Encephalopathy.

EXAM:
MRI HEAD WITHOUT AND WITH CONTRAST
TECHNIQUE: Multiplanar, multiecho pulse sequences of the brain and surrounding
structures were obtained without and with intravenous contrast.
CONTRAST:  9mL GADAVIST GADOBUTROL 1 MMOL/ML IV SOLN

[Series 5: DWI · axial · 3.0mm · 0.88mm/px · z∈[-48,+86]mm · 7 of 92 slices shown (1 of 4)]
[im 1/92]
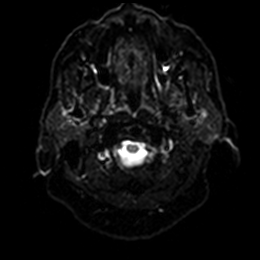
[im 16/92]
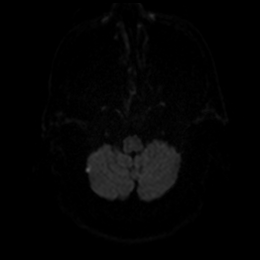
[im 31/92]
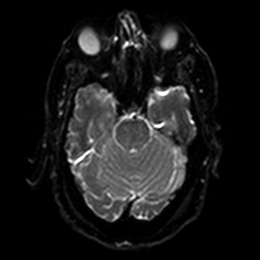
[im 46/92]
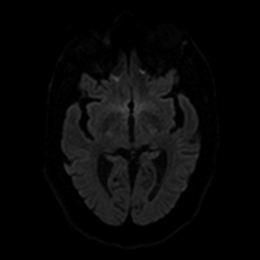
[im 61/92]
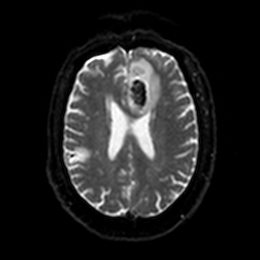
[im 76/92]
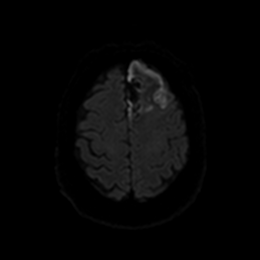
[im 92/92]
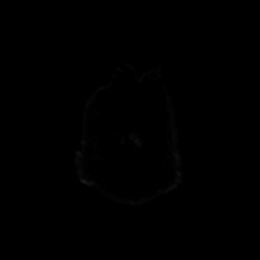

[Series 6: DWI · axial · 3.0mm · 0.88mm/px · z∈[-48,+86]mm · 3 of 46 slices shown (2 of 4)]
[im 1/46]
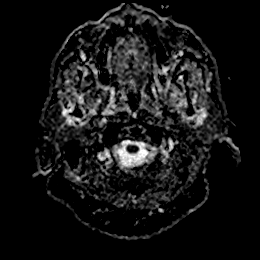
[im 23/46]
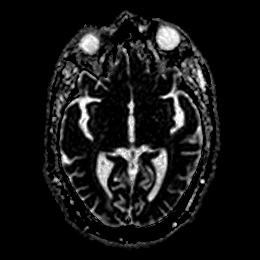
[im 46/46]
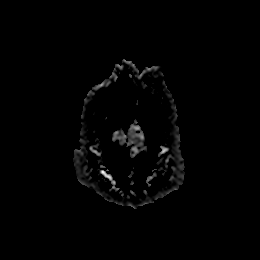

[Series 7: DWI · coronal · 4.0mm · 0.88mm/px · 4 of 66 slices shown (3 of 4)]
[im 1/66]
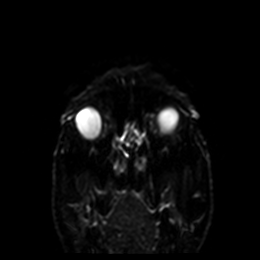
[im 22/66]
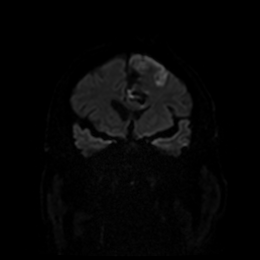
[im 44/66]
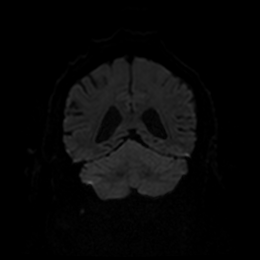
[im 66/66]
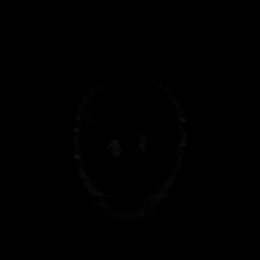

[Series 8: DWI · coronal · 4.0mm · 0.88mm/px · 2 of 33 slices shown (4 of 4)]
[im 1/33]
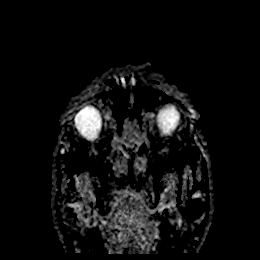
[im 33/33]
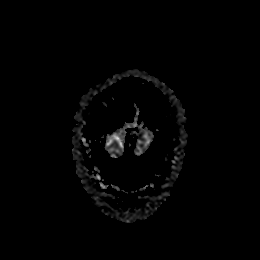

[Series 9: mag_images · axial · 3.0mm · 0.90mm/px · z∈[-58,+94]mm · 3 of 52 slices shown]
[im 1/52]
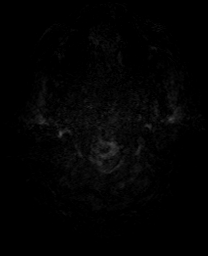
[im 26/52]
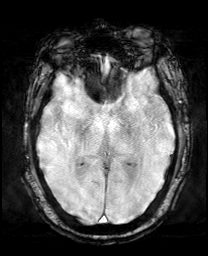
[im 52/52]
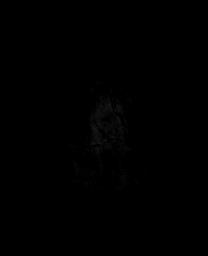

[Series 10: pha_images · axial · 3.0mm · 0.90mm/px · z∈[-55,+94]mm · 3 of 51 slices shown]
[im 1/51]
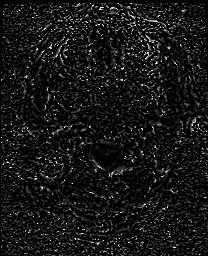
[im 26/51]
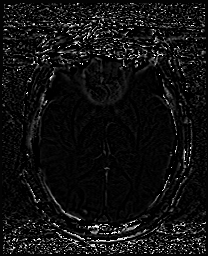
[im 51/51]
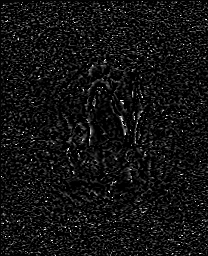

[Series 11: swi_images · axial · 3.0mm · 0.90mm/px · z∈[-58,+94]mm · 3 of 52 slices shown]
[im 1/52]
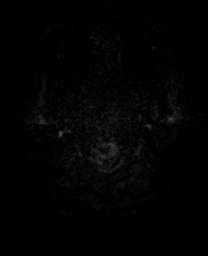
[im 26/52]
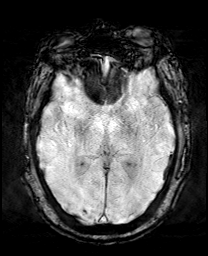
[im 52/52]
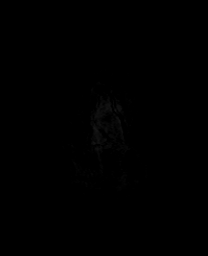

[Series 12: mip_images(sw) · axial · 24.0mm · 0.90mm/px · z∈[-48,+84]mm · 3 of 45 slices shown]
[im 1/45]
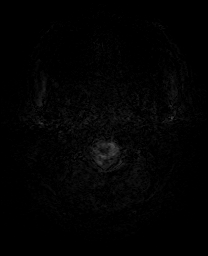
[im 23/45]
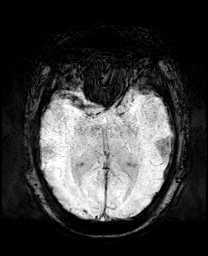
[im 45/45]
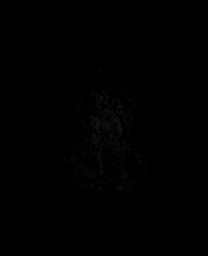

[Series 13: FLAIR · axial · 5.0mm · 0.90mm/px · z∈[-58,+86]mm · 2 of 25 slices shown]
[im 1/25]
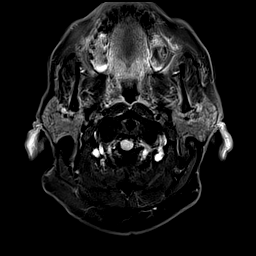
[im 25/25]
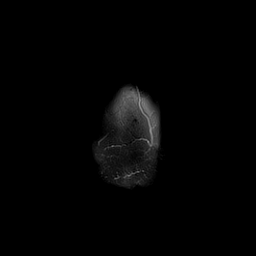

[Series 14: T2 · axial · 5.0mm · 0.72mm/px · z∈[-58,+86]mm · 2 of 25 slices shown (1 of 2)]
[im 1/25]
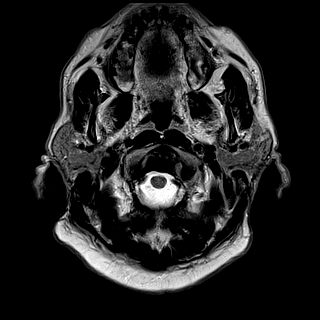
[im 25/25]
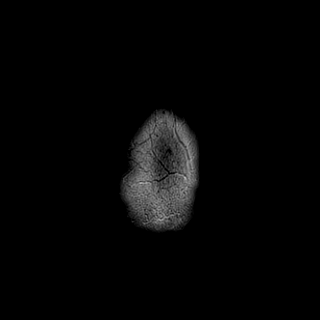

[Series 15: T1 · sagittal · 5.0mm · 0.75mm/px · 2 of 25 slices shown]
[im 1/25]
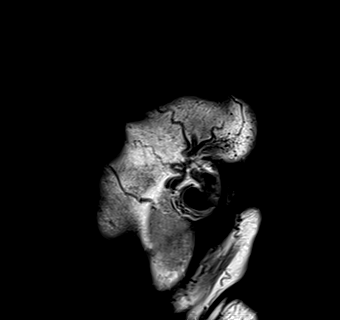
[im 25/25]
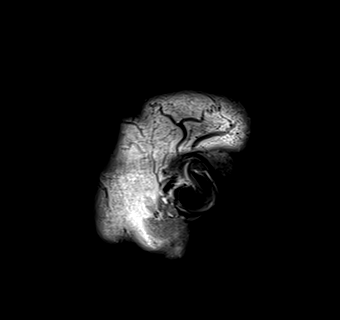

[Series 17: T2 · coronal · 5.0mm · 0.34mm/px · 2 of 29 slices shown (2 of 2)]
[im 1/29]
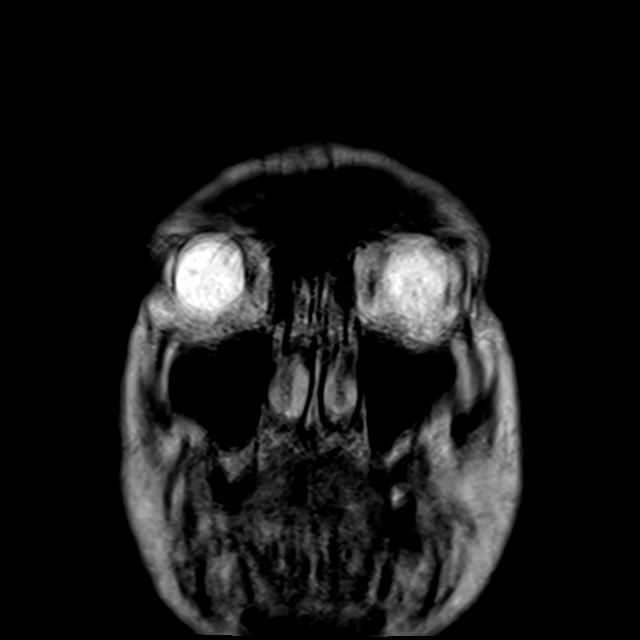
[im 29/29]
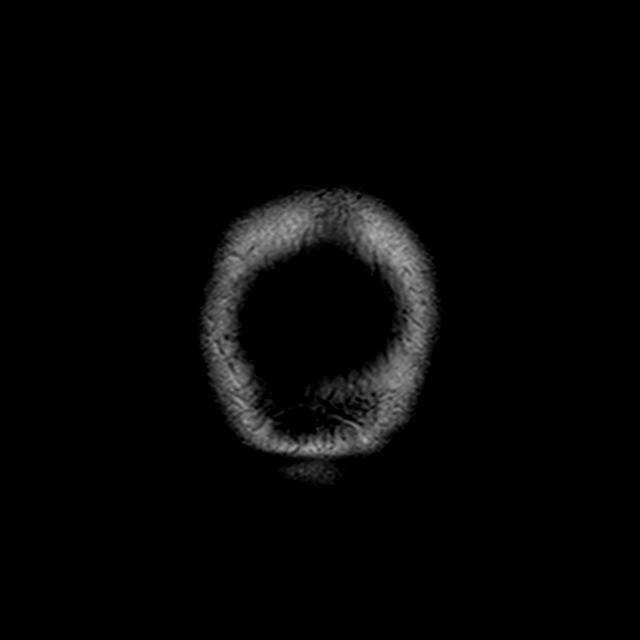

[Series 18: ax hemo · axial · 5.0mm · 0.86mm/px · z∈[-56,+87]mm · 2 of 25 slices shown]
[im 1/25]
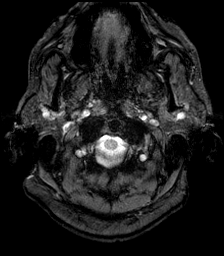
[im 25/25]
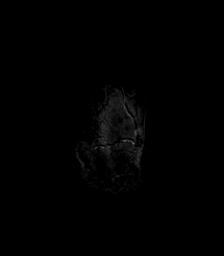

[Series 20: T1 post-contrast · coronal · 5.0mm · 0.34mm/px · 2 of 28 slices shown (1 of 2)]
[im 1/28]
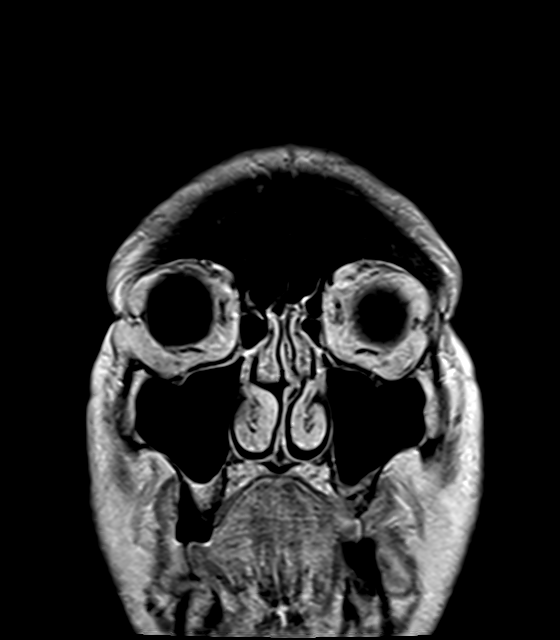
[im 28/28]
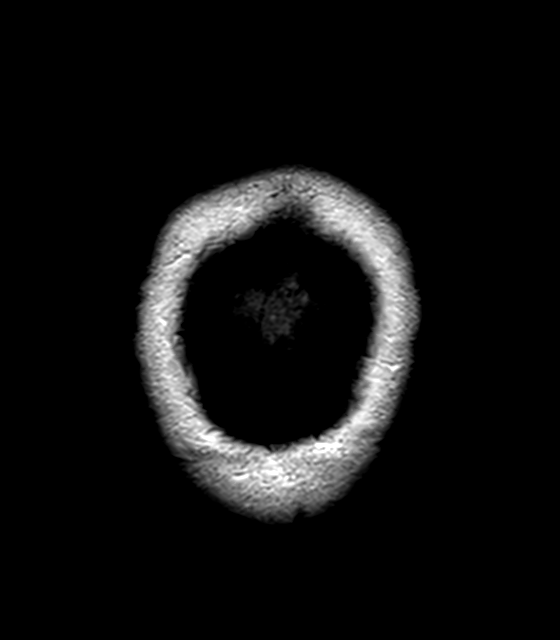

[Series 21: T1 post-contrast · sagittal · 5.0mm · 0.72mm/px · 2 of 25 slices shown (2 of 2)]
[im 1/25]
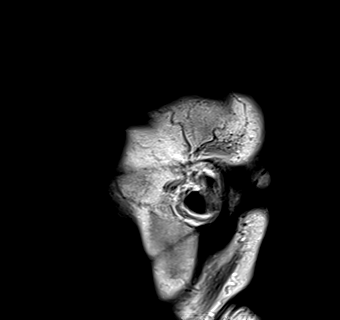
[im 25/25]
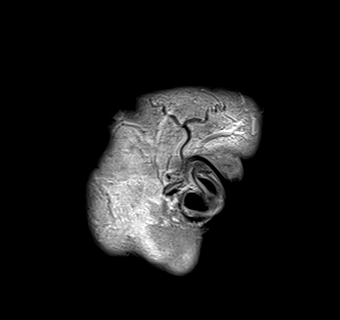

[42 of 48 positions shown; findings below may reference images not displayed]

FINDINGS: BRAIN: There is a large area of abnormal signal within the left
frontal lobe, with areas of restricted diffusion, facilitated
diffusion and magnetic susceptibility effect. There is no associated
contrast enhancement. There is mass effect on the frontal horn of
the left lateral ventricle with 1-2 mm of rightward midline shift of
the left cingulate gyrus. No hydrocephalus. There are old right
parietal and occipital lobe infarcts. Midline structures are normal.
There is magnetic susceptibility effect at multiple locations within
the left frontal lesion, roughly corresponding to relatively
hyperdense areas on the earlier head CT.

VASCULAR: Major flow voids are preserved. Susceptibility-sensitive
sequences show no chronic microhemorrhage or superficial siderosis.

SKULL AND UPPER CERVICAL SPINE: Normal calvarium and skull base.
Visualized upper cervical spine and soft tissues are normal.

SINUSES/ORBITS: No paranasal sinus fluid levels or advanced mucosal
thickening. No mastoid or middle ear effusion. Normal orbits.
IMPRESSION: 1. Area of vasogenic edema within the left frontal lobe without
associated contrast enhancement. This is most suggestive of a
neoplasm such as a low to intermediate grade glioma.
2. Areas of magnetic susceptibility effect within the periphery of
the left frontal lobe lesion may indicate venous congestion or late
subacute to chronic petechial hemorrhage.
3. Old right parietal and occipital lobe infarcts.
